# Patient Record
Sex: Female | Born: 1965 | Race: White | Hispanic: No | Marital: Married | Smoking: Former smoker
Health system: Southern US, Community
[De-identification: ages and names within clinical notes are randomized; demographics above are authoritative.]

## PROBLEM LIST (undated history)

## (undated) DIAGNOSIS — I10 Essential (primary) hypertension: Secondary | ICD-10-CM

## (undated) DIAGNOSIS — F605 Obsessive-compulsive personality disorder: Secondary | ICD-10-CM

## (undated) DIAGNOSIS — Z9229 Personal history of other drug therapy: Secondary | ICD-10-CM

## (undated) DIAGNOSIS — M542 Cervicalgia: Secondary | ICD-10-CM

## (undated) DIAGNOSIS — D649 Anemia, unspecified: Secondary | ICD-10-CM

## (undated) DIAGNOSIS — Z8701 Personal history of pneumonia (recurrent): Secondary | ICD-10-CM

## (undated) DIAGNOSIS — D839 Common variable immunodeficiency, unspecified: Secondary | ICD-10-CM

## (undated) DIAGNOSIS — K219 Gastro-esophageal reflux disease without esophagitis: Secondary | ICD-10-CM

## (undated) DIAGNOSIS — R413 Other amnesia: Secondary | ICD-10-CM

## (undated) DIAGNOSIS — F4310 Post-traumatic stress disorder, unspecified: Secondary | ICD-10-CM

## (undated) DIAGNOSIS — E785 Hyperlipidemia, unspecified: Secondary | ICD-10-CM

## (undated) DIAGNOSIS — F32A Depression, unspecified: Secondary | ICD-10-CM

## (undated) DIAGNOSIS — R3915 Urgency of urination: Secondary | ICD-10-CM

## (undated) DIAGNOSIS — E119 Type 2 diabetes mellitus without complications: Secondary | ICD-10-CM

## (undated) DIAGNOSIS — M797 Fibromyalgia: Secondary | ICD-10-CM

## (undated) DIAGNOSIS — M1990 Unspecified osteoarthritis, unspecified site: Secondary | ICD-10-CM

## (undated) DIAGNOSIS — F319 Bipolar disorder, unspecified: Secondary | ICD-10-CM

## (undated) DIAGNOSIS — K589 Irritable bowel syndrome without diarrhea: Secondary | ICD-10-CM

## (undated) DIAGNOSIS — Z8669 Personal history of other diseases of the nervous system and sense organs: Secondary | ICD-10-CM

## (undated) DIAGNOSIS — Z87898 Personal history of other specified conditions: Secondary | ICD-10-CM

## (undated) DIAGNOSIS — M25552 Pain in left hip: Secondary | ICD-10-CM

## (undated) DIAGNOSIS — F329 Major depressive disorder, single episode, unspecified: Secondary | ICD-10-CM

## (undated) HISTORY — DX: Hyperlipidemia, unspecified: E78.5

## (undated) HISTORY — DX: Post-traumatic stress disorder, unspecified: F43.10

## (undated) HISTORY — DX: Pain in left hip: M25.552

## (undated) HISTORY — DX: Depression, unspecified: F32.A

## (undated) HISTORY — DX: Personal history of pneumonia (recurrent): Z87.01

## (undated) HISTORY — DX: Gastro-esophageal reflux disease without esophagitis: K21.9

## (undated) HISTORY — DX: Personal history of other diseases of the nervous system and sense organs: Z86.69

## (undated) HISTORY — DX: Obsessive-compulsive personality disorder: F60.5

## (undated) HISTORY — DX: Cervicalgia: M54.2

## (undated) HISTORY — PX: CHOLECYSTECTOMY: SHX55

## (undated) HISTORY — DX: Fibromyalgia: M79.7

## (undated) HISTORY — DX: Major depressive disorder, single episode, unspecified: F32.9

## (undated) HISTORY — DX: Bipolar disorder, unspecified: F31.9

## (undated) HISTORY — DX: Essential (primary) hypertension: I10

## (undated) HISTORY — DX: Irritable bowel syndrome, unspecified: K58.9

## (undated) HISTORY — DX: Type 2 diabetes mellitus without complications: E11.9

## (undated) HISTORY — DX: Common variable immunodeficiency, unspecified: D83.9

## (undated) HISTORY — PX: TUBAL LIGATION: SHX77

## (undated) HISTORY — DX: Personal history of other specified conditions: Z87.898

## (undated) HISTORY — PX: OTHER SURGICAL HISTORY: SHX169

## (undated) HISTORY — DX: Personal history of other drug therapy: Z92.29

---
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
## 2002-07-15 ENCOUNTER — Ambulatory Visit (HOSPITAL_COMMUNITY): Admission: RE | Admit: 2002-07-15 | Discharge: 2002-07-15 | Payer: Self-pay | Admitting: Obstetrics and Gynecology

## 2002-07-15 ENCOUNTER — Encounter: Payer: Self-pay | Admitting: Obstetrics and Gynecology

## 2002-07-19 ENCOUNTER — Encounter: Payer: Self-pay | Admitting: Obstetrics and Gynecology

## 2002-07-19 ENCOUNTER — Ambulatory Visit (HOSPITAL_COMMUNITY): Admission: RE | Admit: 2002-07-19 | Discharge: 2002-07-19 | Payer: Self-pay | Admitting: Obstetrics and Gynecology

## 2004-01-28 ENCOUNTER — Ambulatory Visit (HOSPITAL_COMMUNITY): Admission: RE | Admit: 2004-01-28 | Discharge: 2004-01-28 | Payer: Self-pay | Admitting: Pediatrics

## 2006-12-05 HISTORY — PX: MUSCLE BIOPSY: SHX716

## 2007-10-02 ENCOUNTER — Encounter (INDEPENDENT_AMBULATORY_CARE_PROVIDER_SITE_OTHER): Payer: Self-pay | Admitting: Family Medicine

## 2007-10-03 ENCOUNTER — Ambulatory Visit: Payer: Self-pay | Admitting: Family Medicine

## 2007-10-03 DIAGNOSIS — Z87891 Personal history of nicotine dependence: Secondary | ICD-10-CM | POA: Insufficient documentation

## 2007-10-03 DIAGNOSIS — K219 Gastro-esophageal reflux disease without esophagitis: Secondary | ICD-10-CM

## 2007-10-03 DIAGNOSIS — J45909 Unspecified asthma, uncomplicated: Secondary | ICD-10-CM | POA: Insufficient documentation

## 2007-10-03 DIAGNOSIS — K589 Irritable bowel syndrome without diarrhea: Secondary | ICD-10-CM

## 2007-10-03 DIAGNOSIS — E782 Mixed hyperlipidemia: Secondary | ICD-10-CM

## 2007-10-03 DIAGNOSIS — M797 Fibromyalgia: Secondary | ICD-10-CM | POA: Insufficient documentation

## 2007-10-03 DIAGNOSIS — F329 Major depressive disorder, single episode, unspecified: Secondary | ICD-10-CM

## 2007-10-03 DIAGNOSIS — IMO0001 Reserved for inherently not codable concepts without codable children: Secondary | ICD-10-CM

## 2007-10-03 DIAGNOSIS — R002 Palpitations: Secondary | ICD-10-CM

## 2007-10-04 ENCOUNTER — Encounter (INDEPENDENT_AMBULATORY_CARE_PROVIDER_SITE_OTHER): Payer: Self-pay | Admitting: Family Medicine

## 2007-10-04 ENCOUNTER — Telehealth (INDEPENDENT_AMBULATORY_CARE_PROVIDER_SITE_OTHER): Payer: Self-pay | Admitting: *Deleted

## 2007-10-10 ENCOUNTER — Ambulatory Visit (HOSPITAL_COMMUNITY): Admission: RE | Admit: 2007-10-10 | Discharge: 2007-10-10 | Payer: Self-pay | Admitting: Family Medicine

## 2007-10-15 ENCOUNTER — Ambulatory Visit: Payer: Self-pay | Admitting: Cardiology

## 2007-10-18 ENCOUNTER — Ambulatory Visit: Payer: Self-pay | Admitting: Family Medicine

## 2007-10-25 ENCOUNTER — Encounter (INDEPENDENT_AMBULATORY_CARE_PROVIDER_SITE_OTHER): Payer: Self-pay | Admitting: Family Medicine

## 2007-10-26 ENCOUNTER — Telehealth (INDEPENDENT_AMBULATORY_CARE_PROVIDER_SITE_OTHER): Payer: Self-pay | Admitting: Family Medicine

## 2007-10-30 ENCOUNTER — Encounter (INDEPENDENT_AMBULATORY_CARE_PROVIDER_SITE_OTHER): Payer: Self-pay | Admitting: Family Medicine

## 2007-11-05 ENCOUNTER — Telehealth (INDEPENDENT_AMBULATORY_CARE_PROVIDER_SITE_OTHER): Payer: Self-pay | Admitting: *Deleted

## 2007-11-16 ENCOUNTER — Ambulatory Visit: Payer: Self-pay | Admitting: Family Medicine

## 2007-11-16 DIAGNOSIS — I10 Essential (primary) hypertension: Secondary | ICD-10-CM | POA: Insufficient documentation

## 2007-11-19 ENCOUNTER — Telehealth (INDEPENDENT_AMBULATORY_CARE_PROVIDER_SITE_OTHER): Payer: Self-pay | Admitting: *Deleted

## 2007-11-19 ENCOUNTER — Encounter (INDEPENDENT_AMBULATORY_CARE_PROVIDER_SITE_OTHER): Payer: Self-pay | Admitting: Family Medicine

## 2007-11-19 DIAGNOSIS — F319 Bipolar disorder, unspecified: Secondary | ICD-10-CM | POA: Insufficient documentation

## 2007-11-21 ENCOUNTER — Ambulatory Visit (HOSPITAL_COMMUNITY): Admission: RE | Admit: 2007-11-21 | Discharge: 2007-11-21 | Payer: Self-pay | Admitting: Family Medicine

## 2007-11-22 ENCOUNTER — Telehealth (INDEPENDENT_AMBULATORY_CARE_PROVIDER_SITE_OTHER): Payer: Self-pay | Admitting: Family Medicine

## 2007-11-26 ENCOUNTER — Telehealth (INDEPENDENT_AMBULATORY_CARE_PROVIDER_SITE_OTHER): Payer: Self-pay | Admitting: Family Medicine

## 2007-12-11 ENCOUNTER — Telehealth (INDEPENDENT_AMBULATORY_CARE_PROVIDER_SITE_OTHER): Payer: Self-pay | Admitting: *Deleted

## 2007-12-24 ENCOUNTER — Encounter (INDEPENDENT_AMBULATORY_CARE_PROVIDER_SITE_OTHER): Payer: Self-pay | Admitting: Family Medicine

## 2007-12-27 ENCOUNTER — Ambulatory Visit (HOSPITAL_COMMUNITY): Payer: Self-pay | Admitting: Psychiatry

## 2008-01-08 ENCOUNTER — Telehealth (INDEPENDENT_AMBULATORY_CARE_PROVIDER_SITE_OTHER): Payer: Self-pay | Admitting: Family Medicine

## 2008-01-10 ENCOUNTER — Ambulatory Visit (HOSPITAL_COMMUNITY): Payer: Self-pay | Admitting: Psychiatry

## 2008-01-11 ENCOUNTER — Ambulatory Visit (HOSPITAL_COMMUNITY): Payer: Self-pay | Admitting: Psychology

## 2008-01-21 ENCOUNTER — Ambulatory Visit: Payer: Self-pay | Admitting: Family Medicine

## 2008-01-22 ENCOUNTER — Telehealth (INDEPENDENT_AMBULATORY_CARE_PROVIDER_SITE_OTHER): Payer: Self-pay | Admitting: Family Medicine

## 2008-01-23 ENCOUNTER — Encounter (INDEPENDENT_AMBULATORY_CARE_PROVIDER_SITE_OTHER): Payer: Self-pay | Admitting: Family Medicine

## 2008-01-23 ENCOUNTER — Telehealth (INDEPENDENT_AMBULATORY_CARE_PROVIDER_SITE_OTHER): Payer: Self-pay | Admitting: *Deleted

## 2008-01-24 ENCOUNTER — Ambulatory Visit (HOSPITAL_COMMUNITY): Payer: Self-pay | Admitting: Psychiatry

## 2008-02-07 ENCOUNTER — Ambulatory Visit: Payer: Self-pay | Admitting: Family Medicine

## 2008-02-07 LAB — CONVERTED CEMR LAB
ALT: 20 units/L (ref 0–35)
BUN: 9 mg/dL (ref 6–23)
Basophils Absolute: 0.1 10*3/uL (ref 0.0–0.1)
CO2: 24 meq/L (ref 19–32)
Calcium: 10 mg/dL (ref 8.4–10.5)
Chloride: 105 meq/L (ref 96–112)
Cholesterol: 201 mg/dL — ABNORMAL HIGH (ref 0–200)
Creatinine, Ser: 0.9 mg/dL (ref 0.40–1.20)
Hemoglobin: 14.7 g/dL (ref 12.0–15.0)
Lymphocytes Relative: 33 % (ref 12–46)
Monocytes Absolute: 0.5 10*3/uL (ref 0.1–1.0)
Monocytes Relative: 7 % (ref 3–12)
Neutro Abs: 3.9 10*3/uL (ref 1.7–7.7)
RBC: 4.53 M/uL (ref 3.87–5.11)
Total CHOL/HDL Ratio: 4.7

## 2008-02-08 ENCOUNTER — Telehealth (INDEPENDENT_AMBULATORY_CARE_PROVIDER_SITE_OTHER): Payer: Self-pay | Admitting: Family Medicine

## 2008-02-12 ENCOUNTER — Ambulatory Visit (HOSPITAL_COMMUNITY): Payer: Self-pay | Admitting: Psychiatry

## 2008-02-14 ENCOUNTER — Encounter (INDEPENDENT_AMBULATORY_CARE_PROVIDER_SITE_OTHER): Payer: Self-pay | Admitting: Family Medicine

## 2008-02-22 ENCOUNTER — Ambulatory Visit: Payer: Self-pay | Admitting: Family Medicine

## 2008-02-25 ENCOUNTER — Telehealth (INDEPENDENT_AMBULATORY_CARE_PROVIDER_SITE_OTHER): Payer: Self-pay | Admitting: *Deleted

## 2008-02-25 ENCOUNTER — Ambulatory Visit: Payer: Self-pay | Admitting: Family Medicine

## 2008-02-25 ENCOUNTER — Ambulatory Visit (HOSPITAL_COMMUNITY): Payer: Self-pay | Admitting: Psychology

## 2008-02-25 DIAGNOSIS — J309 Allergic rhinitis, unspecified: Secondary | ICD-10-CM | POA: Insufficient documentation

## 2008-03-06 ENCOUNTER — Ambulatory Visit (HOSPITAL_COMMUNITY): Payer: Self-pay | Admitting: Psychiatry

## 2008-03-20 ENCOUNTER — Ambulatory Visit: Payer: Self-pay | Admitting: Family Medicine

## 2008-03-21 ENCOUNTER — Telehealth (INDEPENDENT_AMBULATORY_CARE_PROVIDER_SITE_OTHER): Payer: Self-pay | Admitting: Family Medicine

## 2008-04-07 ENCOUNTER — Telehealth (INDEPENDENT_AMBULATORY_CARE_PROVIDER_SITE_OTHER): Payer: Self-pay | Admitting: *Deleted

## 2008-04-14 ENCOUNTER — Encounter (INDEPENDENT_AMBULATORY_CARE_PROVIDER_SITE_OTHER): Payer: Self-pay | Admitting: Family Medicine

## 2008-04-14 ENCOUNTER — Telehealth (INDEPENDENT_AMBULATORY_CARE_PROVIDER_SITE_OTHER): Payer: Self-pay | Admitting: Family Medicine

## 2008-04-17 ENCOUNTER — Telehealth (INDEPENDENT_AMBULATORY_CARE_PROVIDER_SITE_OTHER): Payer: Self-pay | Admitting: *Deleted

## 2008-04-17 ENCOUNTER — Ambulatory Visit: Payer: Self-pay | Admitting: Family Medicine

## 2008-04-17 DIAGNOSIS — R519 Headache, unspecified: Secondary | ICD-10-CM | POA: Insufficient documentation

## 2008-04-17 DIAGNOSIS — R51 Headache: Secondary | ICD-10-CM

## 2008-04-18 ENCOUNTER — Telehealth (INDEPENDENT_AMBULATORY_CARE_PROVIDER_SITE_OTHER): Payer: Self-pay | Admitting: *Deleted

## 2008-04-18 ENCOUNTER — Encounter (INDEPENDENT_AMBULATORY_CARE_PROVIDER_SITE_OTHER): Payer: Self-pay | Admitting: Family Medicine

## 2008-04-22 ENCOUNTER — Encounter (INDEPENDENT_AMBULATORY_CARE_PROVIDER_SITE_OTHER): Payer: Self-pay | Admitting: Family Medicine

## 2008-04-22 LAB — CONVERTED CEMR LAB: Creatinine, Ser: 0.91 mg/dL (ref 0.40–1.20)

## 2008-04-23 ENCOUNTER — Ambulatory Visit (HOSPITAL_COMMUNITY): Admission: RE | Admit: 2008-04-23 | Discharge: 2008-04-23 | Payer: Self-pay | Admitting: Family Medicine

## 2008-04-24 ENCOUNTER — Telehealth (INDEPENDENT_AMBULATORY_CARE_PROVIDER_SITE_OTHER): Payer: Self-pay | Admitting: *Deleted

## 2008-04-29 ENCOUNTER — Telehealth (INDEPENDENT_AMBULATORY_CARE_PROVIDER_SITE_OTHER): Payer: Self-pay | Admitting: Family Medicine

## 2008-05-01 ENCOUNTER — Ambulatory Visit: Payer: Self-pay | Admitting: Internal Medicine

## 2008-05-01 ENCOUNTER — Ambulatory Visit (HOSPITAL_COMMUNITY): Payer: Self-pay | Admitting: Psychiatry

## 2008-05-02 ENCOUNTER — Ambulatory Visit: Payer: Self-pay | Admitting: Family Medicine

## 2008-05-07 ENCOUNTER — Telehealth (INDEPENDENT_AMBULATORY_CARE_PROVIDER_SITE_OTHER): Payer: Self-pay | Admitting: *Deleted

## 2008-05-07 ENCOUNTER — Emergency Department (HOSPITAL_COMMUNITY): Admission: EM | Admit: 2008-05-07 | Discharge: 2008-05-07 | Payer: Self-pay | Admitting: Emergency Medicine

## 2008-05-08 ENCOUNTER — Ambulatory Visit (HOSPITAL_COMMUNITY): Payer: Self-pay | Admitting: Psychiatry

## 2008-05-13 ENCOUNTER — Ambulatory Visit (HOSPITAL_COMMUNITY): Payer: Self-pay | Admitting: Psychiatry

## 2008-05-16 ENCOUNTER — Ambulatory Visit: Payer: Self-pay | Admitting: Family Medicine

## 2008-06-03 ENCOUNTER — Ambulatory Visit: Payer: Self-pay | Admitting: Family Medicine

## 2008-06-03 LAB — CONVERTED CEMR LAB
Blood in Urine, dipstick: NEGATIVE
Ketones, urine, test strip: NEGATIVE
Urobilinogen, UA: 0.2
WBC Urine, dipstick: NEGATIVE

## 2008-06-10 ENCOUNTER — Ambulatory Visit (HOSPITAL_COMMUNITY): Payer: Self-pay | Admitting: Psychiatry

## 2008-06-12 ENCOUNTER — Ambulatory Visit (HOSPITAL_COMMUNITY): Payer: Self-pay | Admitting: Psychiatry

## 2008-06-16 ENCOUNTER — Emergency Department (HOSPITAL_COMMUNITY): Admission: EM | Admit: 2008-06-16 | Discharge: 2008-06-16 | Payer: Self-pay | Admitting: Emergency Medicine

## 2008-06-25 ENCOUNTER — Encounter (INDEPENDENT_AMBULATORY_CARE_PROVIDER_SITE_OTHER): Payer: Self-pay | Admitting: Family Medicine

## 2008-07-03 ENCOUNTER — Ambulatory Visit (HOSPITAL_COMMUNITY): Admission: RE | Admit: 2008-07-03 | Discharge: 2008-07-03 | Payer: Self-pay | Admitting: Family Medicine

## 2008-07-15 ENCOUNTER — Ambulatory Visit (HOSPITAL_COMMUNITY): Payer: Self-pay | Admitting: Psychiatry

## 2008-08-05 ENCOUNTER — Ambulatory Visit (HOSPITAL_COMMUNITY): Payer: Self-pay | Admitting: Psychiatry

## 2008-08-12 ENCOUNTER — Ambulatory Visit (HOSPITAL_COMMUNITY): Payer: Self-pay | Admitting: Psychiatry

## 2008-08-12 ENCOUNTER — Ambulatory Visit: Admission: RE | Admit: 2008-08-12 | Discharge: 2008-08-12 | Payer: Self-pay | Admitting: Family Medicine

## 2008-08-13 ENCOUNTER — Ambulatory Visit (HOSPITAL_COMMUNITY): Admission: RE | Admit: 2008-08-13 | Discharge: 2008-08-13 | Payer: Self-pay | Admitting: Family Medicine

## 2008-09-16 ENCOUNTER — Ambulatory Visit (HOSPITAL_COMMUNITY): Payer: Self-pay | Admitting: Psychiatry

## 2008-10-16 ENCOUNTER — Ambulatory Visit (HOSPITAL_COMMUNITY): Payer: Self-pay | Admitting: Psychiatry

## 2008-12-11 ENCOUNTER — Ambulatory Visit (HOSPITAL_COMMUNITY): Payer: Self-pay | Admitting: Psychiatry

## 2009-02-05 ENCOUNTER — Ambulatory Visit (HOSPITAL_COMMUNITY): Payer: Self-pay | Admitting: Psychiatry

## 2009-02-13 ENCOUNTER — Ambulatory Visit: Payer: Self-pay | Admitting: Family Medicine

## 2009-02-13 DIAGNOSIS — L708 Other acne: Secondary | ICD-10-CM

## 2009-02-13 DIAGNOSIS — E559 Vitamin D deficiency, unspecified: Secondary | ICD-10-CM | POA: Insufficient documentation

## 2009-02-17 ENCOUNTER — Encounter (INDEPENDENT_AMBULATORY_CARE_PROVIDER_SITE_OTHER): Payer: Self-pay | Admitting: Family Medicine

## 2009-03-05 ENCOUNTER — Ambulatory Visit: Payer: Self-pay | Admitting: Family Medicine

## 2009-03-05 ENCOUNTER — Ambulatory Visit (HOSPITAL_COMMUNITY): Payer: Self-pay | Admitting: Psychiatry

## 2009-03-05 DIAGNOSIS — D649 Anemia, unspecified: Secondary | ICD-10-CM | POA: Insufficient documentation

## 2009-03-05 DIAGNOSIS — R740 Nonspecific elevation of levels of transaminase and lactic acid dehydrogenase [LDH]: Secondary | ICD-10-CM

## 2009-03-06 ENCOUNTER — Encounter (INDEPENDENT_AMBULATORY_CARE_PROVIDER_SITE_OTHER): Payer: Self-pay | Admitting: Family Medicine

## 2009-03-09 LAB — CONVERTED CEMR LAB
ALT: 38 units/L — ABNORMAL HIGH (ref 0–35)
Albumin: 4.6 g/dL (ref 3.5–5.2)
CO2: 22 meq/L (ref 19–32)
Chloride: 105 meq/L (ref 96–112)
Cholesterol: 252 mg/dL — ABNORMAL HIGH (ref 0–200)
Glucose, Bld: 92 mg/dL (ref 70–99)
Hemoglobin: 12.1 g/dL (ref 12.0–15.0)
Lymphocytes Relative: 24 % (ref 12–46)
Lymphs Abs: 2.1 10*3/uL (ref 0.7–4.0)
MCHC: 32.2 g/dL (ref 30.0–36.0)
Monocytes Absolute: 0.6 10*3/uL (ref 0.1–1.0)
Monocytes Relative: 7 % (ref 3–12)
Neutro Abs: 5.9 10*3/uL (ref 1.7–7.7)
Potassium: 4.3 meq/L (ref 3.5–5.3)
RBC: 4.14 M/uL (ref 3.87–5.11)
Sodium: 141 meq/L (ref 135–145)
Total Protein: 6.6 g/dL (ref 6.0–8.3)
Triglycerides: 295 mg/dL — ABNORMAL HIGH (ref <150)
Vit D, 25-Hydroxy: 25 ng/mL — ABNORMAL LOW (ref 30–89)
WBC: 8.7 10*3/uL (ref 4.0–10.5)

## 2009-03-16 ENCOUNTER — Ambulatory Visit: Payer: Self-pay | Admitting: Family Medicine

## 2009-03-20 ENCOUNTER — Telehealth (INDEPENDENT_AMBULATORY_CARE_PROVIDER_SITE_OTHER): Payer: Self-pay | Admitting: *Deleted

## 2009-04-06 ENCOUNTER — Encounter (INDEPENDENT_AMBULATORY_CARE_PROVIDER_SITE_OTHER): Payer: Self-pay | Admitting: Family Medicine

## 2009-04-09 ENCOUNTER — Ambulatory Visit: Payer: Self-pay | Admitting: Family Medicine

## 2009-04-13 ENCOUNTER — Ambulatory Visit: Payer: Self-pay | Admitting: Cardiology

## 2009-04-13 ENCOUNTER — Encounter: Payer: Self-pay | Admitting: Cardiology

## 2009-04-17 ENCOUNTER — Ambulatory Visit: Payer: Self-pay | Admitting: Cardiology

## 2009-05-05 ENCOUNTER — Ambulatory Visit: Payer: Self-pay | Admitting: Family Medicine

## 2009-05-05 ENCOUNTER — Ambulatory Visit (HOSPITAL_COMMUNITY): Payer: Self-pay | Admitting: Psychiatry

## 2009-05-06 ENCOUNTER — Encounter (INDEPENDENT_AMBULATORY_CARE_PROVIDER_SITE_OTHER): Payer: Self-pay | Admitting: Family Medicine

## 2009-05-12 ENCOUNTER — Telehealth (INDEPENDENT_AMBULATORY_CARE_PROVIDER_SITE_OTHER): Payer: Self-pay | Admitting: *Deleted

## 2009-05-13 ENCOUNTER — Encounter (INDEPENDENT_AMBULATORY_CARE_PROVIDER_SITE_OTHER): Payer: Self-pay | Admitting: Family Medicine

## 2009-06-02 ENCOUNTER — Ambulatory Visit: Payer: Self-pay | Admitting: Family Medicine

## 2009-06-03 ENCOUNTER — Telehealth (INDEPENDENT_AMBULATORY_CARE_PROVIDER_SITE_OTHER): Payer: Self-pay | Admitting: Family Medicine

## 2009-06-04 ENCOUNTER — Telehealth (INDEPENDENT_AMBULATORY_CARE_PROVIDER_SITE_OTHER): Payer: Self-pay | Admitting: Family Medicine

## 2009-06-12 ENCOUNTER — Telehealth (INDEPENDENT_AMBULATORY_CARE_PROVIDER_SITE_OTHER): Payer: Self-pay | Admitting: *Deleted

## 2009-06-30 ENCOUNTER — Ambulatory Visit (HOSPITAL_COMMUNITY): Payer: Self-pay | Admitting: Psychiatry

## 2009-06-30 ENCOUNTER — Ambulatory Visit: Payer: Self-pay | Admitting: Family Medicine

## 2009-06-30 DIAGNOSIS — K5909 Other constipation: Secondary | ICD-10-CM

## 2009-06-30 DIAGNOSIS — K5900 Constipation, unspecified: Secondary | ICD-10-CM | POA: Insufficient documentation

## 2009-06-30 DIAGNOSIS — R194 Change in bowel habit: Secondary | ICD-10-CM | POA: Insufficient documentation

## 2009-07-02 ENCOUNTER — Ambulatory Visit (HOSPITAL_COMMUNITY): Payer: Self-pay | Admitting: Psychiatry

## 2009-07-09 ENCOUNTER — Ambulatory Visit (HOSPITAL_COMMUNITY): Payer: Self-pay | Admitting: Psychiatry

## 2009-07-23 ENCOUNTER — Encounter (INDEPENDENT_AMBULATORY_CARE_PROVIDER_SITE_OTHER): Payer: Self-pay | Admitting: Family Medicine

## 2009-07-28 ENCOUNTER — Ambulatory Visit: Payer: Self-pay | Admitting: Family Medicine

## 2009-07-30 LAB — CONVERTED CEMR LAB
ALT: 19 units/L (ref 0–35)
Albumin: 4.6 g/dL (ref 3.5–5.2)
Alkaline Phosphatase: 49 units/L (ref 39–117)
CO2: 26 meq/L (ref 19–32)
Glucose, Bld: 91 mg/dL (ref 70–99)
Potassium: 4.9 meq/L (ref 3.5–5.3)
Sodium: 140 meq/L (ref 135–145)
Total Protein: 6.6 g/dL (ref 6.0–8.3)

## 2009-08-04 ENCOUNTER — Ambulatory Visit (HOSPITAL_COMMUNITY): Payer: Self-pay | Admitting: Psychiatry

## 2009-08-18 ENCOUNTER — Encounter (INDEPENDENT_AMBULATORY_CARE_PROVIDER_SITE_OTHER): Payer: Self-pay | Admitting: Family Medicine

## 2009-08-24 ENCOUNTER — Telehealth (INDEPENDENT_AMBULATORY_CARE_PROVIDER_SITE_OTHER): Payer: Self-pay | Admitting: *Deleted

## 2009-09-15 ENCOUNTER — Ambulatory Visit (HOSPITAL_COMMUNITY): Payer: Self-pay | Admitting: Psychiatry

## 2009-11-12 ENCOUNTER — Ambulatory Visit (HOSPITAL_COMMUNITY): Payer: Self-pay | Admitting: Psychiatry

## 2010-01-12 ENCOUNTER — Ambulatory Visit (HOSPITAL_COMMUNITY): Payer: Self-pay | Admitting: Psychiatry

## 2010-03-09 ENCOUNTER — Ambulatory Visit (HOSPITAL_COMMUNITY): Payer: Self-pay | Admitting: Psychiatry

## 2010-04-22 ENCOUNTER — Ambulatory Visit (HOSPITAL_COMMUNITY): Payer: Self-pay | Admitting: Psychiatry

## 2010-05-11 ENCOUNTER — Ambulatory Visit (HOSPITAL_COMMUNITY): Payer: Self-pay | Admitting: Psychiatry

## 2010-07-13 ENCOUNTER — Ambulatory Visit (HOSPITAL_COMMUNITY): Payer: Self-pay | Admitting: Psychiatry

## 2010-08-24 ENCOUNTER — Ambulatory Visit (HOSPITAL_COMMUNITY): Payer: Self-pay | Admitting: Psychiatry

## 2010-09-21 ENCOUNTER — Ambulatory Visit (HOSPITAL_COMMUNITY): Payer: Self-pay | Admitting: Psychiatry

## 2010-12-16 ENCOUNTER — Ambulatory Visit (HOSPITAL_COMMUNITY)
Admission: RE | Admit: 2010-12-16 | Discharge: 2010-12-16 | Payer: Self-pay | Source: Home / Self Care | Attending: Psychiatry | Admitting: Psychiatry

## 2011-01-02 LAB — CONVERTED CEMR LAB
HDL goal, serum: 40 mg/dL
LDL Goal: 130 mg/dL

## 2011-02-10 ENCOUNTER — Encounter (INDEPENDENT_AMBULATORY_CARE_PROVIDER_SITE_OTHER): Payer: 59 | Admitting: Psychiatry

## 2011-02-10 DIAGNOSIS — F3189 Other bipolar disorder: Secondary | ICD-10-CM

## 2011-04-07 ENCOUNTER — Encounter (HOSPITAL_COMMUNITY): Payer: 59 | Admitting: Psychiatry

## 2011-04-12 ENCOUNTER — Encounter (INDEPENDENT_AMBULATORY_CARE_PROVIDER_SITE_OTHER): Payer: 59 | Admitting: Psychiatry

## 2011-04-12 DIAGNOSIS — F319 Bipolar disorder, unspecified: Secondary | ICD-10-CM

## 2011-04-19 NOTE — Assessment & Plan Note (Signed)
[facility]                          [facility] OFFICE NOTE   NAME:Michelle Clements, [name]                         MRN:          [id number]  DATE:04/17/2009                            DOB:          06-22-1966    PRIMARY CARE PHYSICIAN:  Dr. Erby Pian, [place] Ace.   REASON FOR PRESENTATION:  Evaluate the patient's palpitations.   HISTORY OF PRESENT ILLNESS:  The patient is a very pleasant 45 year old  white female with a history of palpitations a couple of years ago.  She  wore a Holter monitor which demonstrated no significant tachy  palpitations.  She was hospitalized on Apr 06, 2009, with palpitations.  She has been having an increased episode of these over several months,  but they were particularly severe that day.  She describes some  fluttering starting from her abdomen and working up.  She said it came  in waves.  She would feel dizzy.  She felt fatigued.  She would have  to take deep breaths which was happening fairly frequently, sometimes a  few times in an hour that day.  She went to the emergency room.  She was  hospitalized overnight.  There was nothing identified on telemetry.  She  had normal electrolytes.  Her thyroid was normal.  She did have an  outpatient echocardiogram which was essentially normal with perhaps mild  concentric LVH.  No medication changes were suggested.  She was set up  to come see [place] here.   Since that time, she has had very few palpitations.  She has had perhaps  a couple of isolated episodes.  She has had no chest pressure, neck or  arm discomfort.  She has had no shortness of breath, PND, or orthopnea.  She works out at [employer] about 3 times a week.  She does smoke a half  pack of cigarettes a day.   PAST MEDICAL HISTORY:  1. Fibromyalgia.  2. Bipolar affective disorder.  3. Asthma.  4. Seasonal allergies.  5. Gastroesophageal reflux disease.  6. Irritable bowel syndrome.  7. Sleep apnea, on CPAP.  8.  Hyperlipidemia.   PAST SURGICAL HISTORY:  Bilateral tubal ligation and cholecystectomy.   ALLERGIES:  SULFA.   MEDICATIONS:  1. Clonazepam 1 mg b.i.d.  2. Omeprazole 20 mg daily.  3. Lamotrigine 200 mg daily.  4. Equate.  5. Doxycycline 50 mg b.i.d.  6. Vitamin D.  7. Geodon 80 mg nightly.  8. Citalopram 80 mg daily.  9. Singulair 10 mg daily.  10.Amitiza 24 mcg b.i.d.   SOCIAL HISTORY:  The patient smokes cigarettes.  She smokes a half pack  per day.  She does not drink alcohol.  She has 2 children, 1 aged 68, 1  aged 84.  She is on disability from her fibromyalgia and bipolar  disorder.   FAMILY HISTORY:  Contributory for her mother having heart disease in her  79s.  She is alive in her 24s with heart failure.  Her father died of  brain cancer at age 59.   REVIEW OF SYSTEMS:  As stated in the HPI  and positive for constipation,  some mild ankle and hand swelling sporadically, mild joint pains.  Negative for all other systems.   PHYSICAL EXAMINATION:  GENERAL:  The patient is pleasant and in no  distress.  VITAL SIGNS:  Blood pressure 123/75, heart rate 74 and regular, and  weight 248 pounds.  HEENT:  Eyelids unremarkable.  Pupils equal, round, and reactive to  light.  Fundi not visualized.  Oral mucosa unremarkable.  NECK:  No jugular venous distention at 45 degrees.  Carotid upstroke  brisk and symmetric.  No bruits.  No thyromegaly.  LYMPHATICS:  No cervical, axillary, or inguinal adenopathy.  LUNGS:  Clear to auscultation bilaterally.  BACK:  No costovertebral angle tenderness.  CHEST:  Unremarkable.  HEART:  PMI not displaced or sustained.  S1 and S2 within normal limits.  No S3, no S4.  No clicks, no rubs, no murmurs.  ABDOMEN:  Obese, positive bowel sounds, normal in frequency and pitch.  No bruits, no rebound, no guarding.  No midline pulsatile mass.  No  hepatomegaly, no splenomegaly.  SKIN:  No rashes, no nodules.  EXTREMITIES:  2+ pulses throughout.  No  edema, no cyanosis, no clubbing.  NEUROLOGIC:  Oriented to person, place, and time.  Cranial nerves II  through XII grossly intact.  Motor grossly intact.   EKG, (done in the hospital) sinus rhythm, rate 83, axis within normal  limits, intervals with very slightly prolonged QT.   ASSESSMENT AND PLAN:  1. Palpitations.  The patient's palpitations are very infrequent now.      The plan was for a CardioNet.  However, at this point, she and I,      both agree that this is not indicated.  We discussed the etiology      of tachy palpitations and the possibilities.  We discussed workup      should these become more symptomatic.  We discussed the      contribution of caffeine.  She is going to let me know if she has      any increasing symptoms.  At that point, I would apply an event      monitor.  2. Tobacco.  We spent quite a bit of time discussing smoking      cessation.  She is going to try cold [place] though she has the      patches and the gum at home already.  3. Obesity.  We discussed weight loss.  I would have her concentrate      on smoking first.  4. Bipolar disorder.  She says this is well treated by her primary      care doctor.  5. Followup.  We will see her back in this clinic as needed.     Rollene Rotunda, MD, [facility]  Electronically Signed    JH/MedQ  DD: 04/17/2009  DT: 04/18/2009  Job #: [id number]   cc:   Franchot Heidelberg, M.D.

## 2011-04-19 NOTE — Procedures (Signed)
NAME:  Michelle Clements, Michelle Clements                  ACCOUNT NO.:  [id number]   MEDICAL RECORD NO.:  [id number]          PATIENT TYPE:  OUT   LOCATION:  SLEE                          FACILITY:  [facility]   PHYSICIAN:  Kofi A. Gerilyn Pilgrim, M.D. DATE OF BIRTH:  Apr 23, 1966   DATE OF PROCEDURE:  DATE OF DISCHARGE:  08/12/2008                             SLEEP DISORDER REPORT   HISTORY:  This is a 45 year old who presents with hypersomnia, snoring,  and is to be evaluated for obstructive sleep apnea syndrome.   MEDICATIONS:  Flexeril, Ultram, Lyrica, Prilosec, Klonopin, Singulair,  Lamictal, Celexa, Geodon.   Epworth sleepiness scale 17.  BMI 33.   ARCHITECTURAL SUMMARY:  This is a split-night study with the first  portion being a diagnostic and second half a titration.  The total  recording time in the diagnostic is 145 minutes and titration 259  minutes.  The sleep efficiency in diagnostic is 75% and 94% is titration  portion.  The sleep latency is 10 minutes and the REM latency is 93  minutes.   RESPIRATORY SUMMARY:  The baseline oxygen saturation 97% with lowest  saturation 93%.  The AHI in the diagnostic portion is 15.  The patient  is titrated between levels of 6 and 15.  She was titrated to higher  pressures due to activity seen in the snoring channel, she actually had  elimination of apneic events pressure of 6.  I believe the optimal  pressure is 10.  The event seen in the snore chart seems to be  artifactual.   LIMB MOVEMENT SUMMARY:  PLM index 0.8.   ELECTROCARDIOGRAM SUMMARY:  Average heart rate 84 with no significant  dysrhythmias observed.   IMPRESSION:  Moderate obstructive sleep apnea syndrome, which responded  well to CPAP of 10.   Thanks for this referral.      Kofi A. Gerilyn Pilgrim, M.D.  Electronically Signed     KAD/MEDQ  D:  08/16/2008  T:  08/16/2008  Job:  [id number]

## 2011-04-26 ENCOUNTER — Encounter (HOSPITAL_COMMUNITY): Payer: 59 | Admitting: Psychiatry

## 2011-06-02 ENCOUNTER — Encounter: Payer: Self-pay | Admitting: Family Medicine

## 2011-06-07 ENCOUNTER — Encounter (INDEPENDENT_AMBULATORY_CARE_PROVIDER_SITE_OTHER): Payer: 59 | Admitting: Psychiatry

## 2011-06-07 DIAGNOSIS — F3189 Other bipolar disorder: Secondary | ICD-10-CM

## 2011-07-05 ENCOUNTER — Encounter (HOSPITAL_COMMUNITY): Payer: 59 | Admitting: Psychiatry

## 2011-07-19 ENCOUNTER — Encounter (INDEPENDENT_AMBULATORY_CARE_PROVIDER_SITE_OTHER): Payer: 59 | Admitting: Psychiatry

## 2011-07-19 ENCOUNTER — Encounter (HOSPITAL_COMMUNITY): Payer: 59 | Admitting: Psychiatry

## 2011-07-19 DIAGNOSIS — F3189 Other bipolar disorder: Secondary | ICD-10-CM

## 2011-09-01 LAB — URINALYSIS, ROUTINE W REFLEX MICROSCOPIC
Bilirubin Urine: NEGATIVE
Ketones, ur: NEGATIVE
Nitrite: NEGATIVE
Protein, ur: NEGATIVE
Urobilinogen, UA: 0.2
pH: 6

## 2011-09-01 LAB — DIFFERENTIAL
Basophils Absolute: 0.1
Basophils Relative: 1
Eosinophils Absolute: 0.2
Eosinophils Relative: 1
Lymphs Abs: 2.4
Neutrophils Relative %: 74

## 2011-09-01 LAB — ETHANOL: Alcohol, Ethyl (B): <5

## 2011-09-01 LAB — BASIC METABOLIC PANEL
BUN: 8
Chloride: 102
Creatinine, Ser: 0.86
Glucose, Bld: 100 — ABNORMAL HIGH
Potassium: 4.2

## 2011-09-01 LAB — CBC
HCT: 43.2
MCHC: 36.4 — ABNORMAL HIGH
MCV: 93.5
Platelets: 380
RDW: 12.8
WBC: 13.1 — ABNORMAL HIGH

## 2011-09-01 LAB — RAPID URINE DRUG SCREEN, HOSP PERFORMED
Cocaine: NOT DETECTED
Tetrahydrocannabinol: NOT DETECTED

## 2011-09-01 LAB — PREGNANCY, URINE: Preg Test, Ur: NEGATIVE

## 2011-09-20 ENCOUNTER — Encounter (INDEPENDENT_AMBULATORY_CARE_PROVIDER_SITE_OTHER): Payer: 59 | Admitting: Psychiatry

## 2011-09-20 DIAGNOSIS — F3189 Other bipolar disorder: Secondary | ICD-10-CM

## 2011-09-29 ENCOUNTER — Encounter (INDEPENDENT_AMBULATORY_CARE_PROVIDER_SITE_OTHER): Payer: 59 | Admitting: Psychiatry

## 2011-09-29 DIAGNOSIS — F3189 Other bipolar disorder: Secondary | ICD-10-CM

## 2011-09-30 NOTE — Progress Notes (Signed)
NAME:  Michelle Clements, Michelle Clements                  ACCOUNT NO.:  [id number]  MEDICAL RECORD NO.:  [id number]  LOCATION:  [facility]                           FACILITY:  [facility]  PHYSICIAN:  Gomer France T. Destry Dauber, M.D.   DATE OF BIRTH:  21-Feb-1966                                PROGRESS NOTE   The patient came in today earlier than her scheduled appointment.  She was last seen on October 16.  At that time she does mention anxiety and nervousness and her Klonopin was increased however, the patient told that she had severe panic attack yesterday when she had difficulty breathing and she felt very depressed, tearful and anxious.  The patient was also given a short course of steroid due to exacerbation of her COPD.  The patient admitted that her mood and anger has been there for a long time and she has not discussed in detail on her last visit.  She admitted getting angry, agitated with her husband and lately with her daughter and she is afraid that her daughter is getting very tired as she is busy in school, tennis and then on her homework.  The patient also admitted increased paranoia with social isolation and decreased sleep though she denies any active or passive suicidal thoughts, but endorsed racing thoughts, easily irritable, agitated and angry.  She also admitted being mad at church, other people but did not become violent.  I review her notes.  The patient has been slowly stopping her psychotropic medication.  In the past she was on multiple psychotropic medications including Celexa, Geodon Klonopin, Wellbutrin, Lamictal but in the past few months she has been cutting down most of her medication due to the expense.  We have decreased and eventually discontinued her Lamictal on her last visit when she mentioned about the rash.  Though she still has some rash but she has been seeing more regress with stopping of Lamictal.  CURRENT MEDICATION: 1. Wellbutrin SR 150 mg daily. 2. Klonopin 1 mg twice a day. 3.  Flexeril 10 mg daily. 4. Glucophage 500 mg twice a day. 5. Synthroid 25 mcg daily. 6. Prevacid. 7. Singular. 8. Lortab 1-2 tablets daily for pain.  MEDICAL HISTORY: The patient sees Dr. Margo Aye.  She has diabetes, fibromyalgia, GERD and degenerative disease.  ALLERGIES: Patient is allergic to sulfa.  MENTAL STATUS EXAMINATION: The patient is very anxious, tearful, maintained fair eye contact.  Her speech is slow.  Her thought process is also slow but logical, linear, and goal-directed.  She endorsed some paranoia, but she denies any active or passive suicidal thoughts, homicidal thoughts or any visual or auditory hallucinations.  She described her mood as depressed and anxious.  Her affect was constricted.  She is alert and oriented x3. Her insight and judgment impulse control were okay.  DIAGNOSIS: Axis I:  Bipolar disorder. Axis II deferred. Axis III:  See medical history. Axis IV:  Moderate Axis V:  55-60.  PLAN OF TREATMENT: I talked to the patient in length about restarting the mood stabilizer. In the past she had a good response with Geodon and Lamictal but these medicines were stopped either due to  side effects or expense.  We talked about starting the lithium in a small dose to target the mood lability.  We will defer antipsychotic medication at this time, due to the concern of increased blood sugar.  However, if the patient continued to show little improvement with the lithium we will consider adding or starting a low-dose Risperdal.  I have explained the risks and benefits of medication in detail.  The patient is scheduled to see her primary care doctor in December for complete blood work.  I will see her again in 2 weeks and will consider getting a lithium level at that time.  I recommended to call us if she has any question or concern.  We also talked about the safety plan.  If she starts feeling worsening or anytime having suicidal thoughts or homicidal thoughts  then she needs to call 911 or go to local ER immediately.  Time spent 30 minutes.  I will see her in 2 weeks.     Kyheem Bathgate T. Lolly Mustache, M.D.     STA/MEDQ  D:  09/29/2011  T:  09/29/2011  Job:  [id number]  Electronically Signed by Kathryne Sharper M.D. on 09/30/2011 11:35:01 AM

## 2011-10-12 ENCOUNTER — Encounter (HOSPITAL_COMMUNITY): Payer: Self-pay | Admitting: Psychiatry

## 2011-10-13 ENCOUNTER — Encounter (HOSPITAL_COMMUNITY): Payer: 59 | Admitting: Psychiatry

## 2011-10-13 ENCOUNTER — Encounter (HOSPITAL_COMMUNITY): Payer: Self-pay | Admitting: Psychiatry

## 2011-10-13 ENCOUNTER — Ambulatory Visit (INDEPENDENT_AMBULATORY_CARE_PROVIDER_SITE_OTHER): Payer: 59 | Admitting: Psychiatry

## 2011-10-13 VITALS — Wt 252.8 lb

## 2011-10-13 DIAGNOSIS — F319 Bipolar disorder, unspecified: Secondary | ICD-10-CM

## 2011-10-13 MED ORDER — BUPROPION HCL ER (SR) 150 MG PO TB12: 150.0000 mg | ORAL_TABLET | Freq: Two times a day (BID) | ORAL | Status: DC

## 2011-10-13 MED ORDER — LITHIUM CARBONATE 300 MG PO TABS: ORAL_TABLET | ORAL | Status: DC

## 2011-10-13 MED ORDER — CLONAZEPAM 1 MG PO TABS: 1.0000 mg | ORAL_TABLET | Freq: Two times a day (BID) | ORAL | Status: DC | PRN

## 2011-10-13 NOTE — Progress Notes (Signed)
Patient came today for her followup appointment. She is now taking lithium twice a day she's been tolerating the medication however initially she had some shakes which is resolving now. She continued to have depressive symptoms and at times she feels very nervous anxious. Still has a few panic attacks since we saw her. She's been taking Klonopin that has been helping her a lot but she is concerned about her depressive thoughts and poor sleep she denies any paranoid thinking or any agitation or anger. In the past she has taken multiple psychotropic medications including Geodon Celexa Wellbutrin Lamictal but he has been stopped due to poor response expense of side effects. She does not want to go back to antipsychotic medication but agreed to take more Wellbutrin and lithium. I reviewed all her medications she has diabetes mellitus and antipsychotic medication may cause increased blood sugar and we will deferred until it is necessary. Mental status emanation patient remains anxious and at times tearful but maintained good eye contact. Her speech is slowed but clear and coherent. Her thought process is logical linear and goal-directed. There are no paranoid thinking and she denies any active or passive suicidal thinking or homicidal thinking. She also denies any visual or auditory hallucination. She described her mood as to past and her affect was constricted. She's alert and oriented x3. Attention and concentration were I insight judgment and impulse control are okay. Assessment bipolar disorder rule out major depressive disorder with psychotic features. Plan I will increase her lithium to 300 mg in the morning and 600 milligram at bedtime I will also increase her Wellbutrin to 150 mg twice a day. I explained the risks and benefits of medication and recommended to call us if she has any questions or concerns about the medication. Time spent 25 minutes I will see her again for to 5 weeks.

## 2011-10-18 ENCOUNTER — Encounter (HOSPITAL_COMMUNITY): Payer: Self-pay | Admitting: Psychiatry

## 2011-10-18 NOTE — Telephone Encounter (Signed)
This encounter was created in error - please disregard.

## 2011-10-18 NOTE — Progress Notes (Signed)
Pt did not done labs

## 2011-10-20 ENCOUNTER — Telehealth (HOSPITAL_COMMUNITY): Payer: Self-pay | Admitting: *Deleted

## 2011-10-20 NOTE — Telephone Encounter (Signed)
Patient called return patient is experiencing the more urination with lithium however she is feeling much better in her mood and less depressed. I recommended to lower the lithium twice a day and she is scheduled to get blood work tomorrow I recommended to call back if symptoms persist otherwise I will see her in 3 weeks.

## 2011-10-20 NOTE — Telephone Encounter (Signed)
Phone call from patient.    She is urinating every hour and getting up three times in the night to pee.   She is on Lithium.

## 2011-11-01 ENCOUNTER — Encounter (HOSPITAL_COMMUNITY): Payer: 59 | Admitting: Psychiatry

## 2011-11-10 ENCOUNTER — Encounter (HOSPITAL_COMMUNITY): Payer: Self-pay | Admitting: Psychiatry

## 2011-11-10 ENCOUNTER — Ambulatory Visit (INDEPENDENT_AMBULATORY_CARE_PROVIDER_SITE_OTHER): Payer: 59 | Admitting: Psychiatry

## 2011-11-10 VITALS — Wt 250.0 lb

## 2011-11-10 DIAGNOSIS — F3189 Other bipolar disorder: Secondary | ICD-10-CM

## 2011-11-10 DIAGNOSIS — F319 Bipolar disorder, unspecified: Secondary | ICD-10-CM

## 2011-11-10 MED ORDER — RISPERIDONE 0.5 MG PO TABS: 0.5000 mg | ORAL_TABLET | Freq: Every day | ORAL | Status: DC

## 2011-11-10 NOTE — Progress Notes (Signed)
[facility] [id number] Progress Note  Michelle Clements [id number] 45 y.o.  11/10/2011 12:07 PM  Chief Complaint: I continued to feel depressed, I have no energy and I continued to feel paranoid and anxious  History of Present Illness: Patient came for her followup appointment. She had tried lithium 900 mg however she complained of frequent urination tired feeling and decided to cut down to 600 mg on her own. Though she reported no frequent urination but noticed increased anxiety nervousness feeling overwhelmed poor sleep anxiety and racing thoughts. Recently she has seen by her primary care physician who had stopped her thyroid medicine believing if tired medicine is causing more nervousness. She believes her heart rate has been much improved along with her anxiety however she continued to endorse periods of severe depression and social isolation. She is now more open about taking Risperdal. In the past we have tried Abilify that cause zombielike feeling, Lamictal with rash and Geodon which was expensive. She is working closely with her primary care physician who will continue to monitor her blood sugar very often. Patient continued to endorse somatic symptoms, she has taking multiple medication for her back pain COPD fibromyalgia degenerative disease and diabetes. I review her labs her lithium level is 0.7 however she has cut down her lithium to 600 mg a day.  Suicidal Ideation: No Plan Formed: No Patient has means to carry out plan: No  Homicidal Ideation: No Plan Formed: No Patient has means to carry out plan: No  Review of Systems: Psychiatric: Agitation: No Hallucination: No Depressed Mood: Yes Insomnia: Yes Hypersomnia: No Altered Concentration: No Feels Worthless: No Grandiose Ideas: No Belief In Special Powers: No New/Increased Substance Abuse: No Compulsions: No  Neurologic: Headache: Yes Seizure: No Paresthesias: No  Past Medical Family, Social History: Patient has  history of diabetes fibromyalgia GERD and degenerative disease. She see Dr. Margo Aye for her primary physical condition and checkup.  Outpatient Encounter Prescriptions as of 11/10/2011  Medication Sig Dispense Refill  . albuterol (ACCUNEB) 0.63 MG/3ML nebulizer solution Take 1 ampule by nebulization every 6 (six) hours as needed.        Marland Kitchen buPROPion (WELLBUTRIN SR) 150 MG 12 hr tablet Take 1 tablet (150 mg total) by mouth 2 (two) times daily.  60 tablet  1  . clonazePAM (KLONOPIN) 1 MG tablet Take 1 tablet (1 mg total) by mouth 2 (two) times daily as needed for anxiety.  60 tablet  0  . cyclobenzaprine (FLEXERIL) 10 MG tablet Take 10 mg by mouth every 6 (six) hours as needed.        . gabapentin (NEURONTIN) 600 MG tablet       . HYDROcodone-acetaminophen (LORTAB) 7.5-500 MG per tablet Take 1 tablet by mouth every 8 (eight) hours as needed.        Marland Kitchen ibuprofen (ADVIL,MOTRIN) 200 MG tablet Take 600 mg by mouth every 6 (six) hours as needed.        . lithium 300 MG tablet 1 in am and 2 at hs  90 tablet  0  . montelukast (SINGULAIR) 10 MG tablet Take 10 mg by mouth daily.        . NON FORMULARY CPAP MASK, FACIAL CUSHION AND TUBING.       Marland Kitchen omeprazole (PRILOSEC OTC) 20 MG tablet Take 20 mg by mouth daily.        Marland Kitchen omeprazole (PRILOSEC) 20 MG capsule       . tretinoin (RETIN-A) 0.1 % cream       .  albuterol (PROAIR HFA) 108 (90 BASE) MCG/ACT inhaler Inhale 2 puffs into the lungs every 6 (six) hours as needed.        . lubiprostone (AMITIZA) 24 MCG capsule Take 24 mcg by mouth 2 (two) times daily.        . metFORMIN (GLUCOPHAGE) 500 MG tablet       . risperiDONE (RISPERDAL) 0.5 MG tablet Take 1 tablet (0.5 mg total) by mouth at bedtime.  30 tablet  1  . DISCONTD: benztropine (COGENTIN) 1 MG tablet Take 1 mg by mouth daily.        Marland Kitchen DISCONTD: budesonide-formoterol (SYMBICORT) 160-4.5 MCG/ACT inhaler Inhale 2 puffs into the lungs 2 (two) times daily.        Marland Kitchen DISCONTD: citalopram (CELEXA) 40 MG tablet Take  40 mg by mouth 2 (two) times daily.        Marland Kitchen DISCONTD: doxycycline (VIBRAMYCIN) 50 MG capsule Take 50 mg by mouth 2 (two) times daily.        Marland Kitchen DISCONTD: lamoTRIgine (LAMICTAL) 100 MG tablet Take 100 mg by mouth at bedtime.        Marland Kitchen DISCONTD: traMADol (ULTRAM) 50 MG tablet Take 50 mg by mouth every 6 (six) hours as needed.        Marland Kitchen DISCONTD: ziprasidone (GEODON) 80 MG capsule Take 80 mg by mouth daily.          Past Psychiatric History/Hospitalization(s): Anxiety: Yes Bipolar Disorder: Yes Depression: Yes Mania: No Psychosis: Yes Schizophrenia: No Personality Disorder: No Hospitalization for psychiatric illness: No History of Electroconvulsive Shock Therapy: No Prior Suicide Attempts: No  Physical Exam: Constitutional:  Wt 250 lb (113.399 kg)  General Appearance: alert, oriented, no acute distress  Musculoskeletal: Strength & Muscle Tone: within normal limits Gait & Station: normal Patient leans: N/A  Psychiatric: Speech (describe rate, volume, coherence, spontaneity, and abnormalities if any): Soft clear but nonspontaneous  Thought Process (describe rate, content, abstract reasoning, and computation): Slow with poverty of thought content. At times obsess with her somatic complaints denies any auditory visual hallucination  Associations: Coherent and Relevant  Thoughts: normal  Mental Status: Orientation: oriented to person, place and time/date Mood & Affect: depressed affect and flat affect Attention Span & Concentration: Poor  Medical Decision Making (Choose Three): New problem, with additional work up planned, Review or order clinical lab tests (1), Established Problem, Worsening (2), Review of Medication Regimen & Side Effects (2) and Review of New Medication or Change in Dosage (2)  Assessment: Axis I: Bipolar disorder depressed type  Axis II: Deferred  Axis III: See medical history  Axis IV: Mild to moderate  Axis V: 55-65   Plan: We will decrease her  lithium to 300 mg twice a day as she cannot tolerate 900 mg a day. I will start low-dose Risperdal 0.5 mg at bedtime. I have explained risks and benefits including metabolic and extrapyramidal side effects of Risperdal. She will closely monitor her blood sugar and call us if she has any question or concern about the medication. She is taking Wellbutrin SR 150 twice a day and Klonopin twice a day without any side effects. I reinforced safety plan that in case she feels worsening of her symptoms or any time having suicidal thoughts and homicidal thoughts and she need to call 911 or go to local ER. I will see her again in 4-5 weeks  Waylynn Benefiel T., MD 11/10/2011

## 2011-12-08 ENCOUNTER — Other Ambulatory Visit (HOSPITAL_COMMUNITY): Payer: Self-pay | Admitting: Psychiatry

## 2011-12-08 DIAGNOSIS — F319 Bipolar disorder, unspecified: Secondary | ICD-10-CM

## 2011-12-22 ENCOUNTER — Encounter (HOSPITAL_COMMUNITY): Payer: Self-pay | Admitting: Psychiatry

## 2011-12-22 ENCOUNTER — Ambulatory Visit (INDEPENDENT_AMBULATORY_CARE_PROVIDER_SITE_OTHER): Payer: 59 | Admitting: Psychiatry

## 2011-12-22 VITALS — Wt 255.0 lb

## 2011-12-22 DIAGNOSIS — F319 Bipolar disorder, unspecified: Secondary | ICD-10-CM

## 2011-12-22 MED ORDER — LITHIUM CARBONATE 300 MG PO CAPS: 300.0000 mg | ORAL_CAPSULE | Freq: Two times a day (BID) | ORAL | Status: DC

## 2011-12-22 MED ORDER — CLONAZEPAM 1 MG PO TABS: 1.0000 mg | ORAL_TABLET | Freq: Two times a day (BID) | ORAL | Status: DC | PRN

## 2011-12-22 MED ORDER — BUPROPION HCL ER (SR) 150 MG PO TB12: 150.0000 mg | ORAL_TABLET | Freq: Two times a day (BID) | ORAL | Status: DC

## 2011-12-22 MED ORDER — RISPERIDONE 0.5 MG PO TABS: 0.5000 mg | ORAL_TABLET | Freq: Two times a day (BID) | ORAL | Status: DC

## 2011-12-22 NOTE — Progress Notes (Signed)
[facility] [id number] Progress Note  Michelle Clements [id number] 46 y.o.  12/22/2011 11:58 AM  Patient came for her followup appointment. She's been doing better since we started Risperdal. Her thoughts are more clear and organized. She is sleeping good. She continues to have chronic depression and feeling tired but overall she feels less depressed and less anxious. She has less complain of crying spells. She had a good Christmas. On her last visit we also reduce her lithium which was causing urinary incontinence. The patient do not seem much improvement and that linear but she still does not want to go back on a higher lithium. Overall patient is stable on her current medication. She denies any agitation anger or paranoid thinking.  Suicidal Ideation: No Plan Formed: No Patient has means to carry out plan: No  Homicidal Ideation: No Plan Formed: No Patient has means to carry out plan: No  Review of Systems: Psychiatric: Agitation: No Hallucination: No Depressed Mood: Yes Insomnia: Yes Hypersomnia: No Altered Concentration: No Feels Worthless: No Grandiose Ideas: No Belief In Special Powers: No New/Increased Substance Abuse: No Compulsions: No  Neurologic: Headache: Yes Seizure: No Paresthesias: No  Past Medical Family, Social History: Patient has history of diabetes fibromyalgia GERD and degenerative disease. She see Dr. Margo Aye for her primary physical condition and checkup.  Outpatient Encounter Prescriptions as of 12/22/2011  Medication Sig Dispense Refill  . buPROPion (WELLBUTRIN SR) 150 MG 12 hr tablet Take 1 tablet (150 mg total) by mouth 2 (two) times daily.  60 tablet  1  . gabapentin (NEURONTIN) 600 MG tablet       . HYDROcodone-acetaminophen (LORTAB) 7.5-500 MG per tablet Take 1 tablet by mouth every 8 (eight) hours as needed.        Marland Kitchen DISCONTD: buPROPion (WELLBUTRIN SR) 150 MG 12 hr tablet Take 1 tablet (150 mg total) by mouth 2 (two) times daily.  60 tablet  1    . DISCONTD: lithium carbonate 300 MG capsule TAKE 1 CAPSULE IN THE MORNING AND 2 CAPSULES AT BEDTIME.  90 capsule  1  . albuterol (ACCUNEB) 0.63 MG/3ML nebulizer solution Take 1 ampule by nebulization every 6 (six) hours as needed.        Marland Kitchen albuterol (PROAIR HFA) 108 (90 BASE) MCG/ACT inhaler Inhale 2 puffs into the lungs every 6 (six) hours as needed.        . clonazePAM (KLONOPIN) 1 MG tablet Take 1 tablet (1 mg total) by mouth 2 (two) times daily as needed for anxiety.  60 tablet  1  . cyclobenzaprine (FLEXERIL) 10 MG tablet Take 10 mg by mouth every 6 (six) hours as needed.        . doxycycline (VIBRA-TABS) 100 MG tablet       . ibuprofen (ADVIL,MOTRIN) 200 MG tablet Take 600 mg by mouth every 6 (six) hours as needed.        . lithium carbonate 300 MG capsule Take 1 capsule (300 mg total) by mouth 2 (two) times daily with a meal.  60 capsule  0  . lubiprostone (AMITIZA) 24 MCG capsule Take 24 mcg by mouth 2 (two) times daily.        . metFORMIN (GLUCOPHAGE) 500 MG tablet       . montelukast (SINGULAIR) 10 MG tablet Take 10 mg by mouth daily.        . NON FORMULARY CPAP MASK, FACIAL CUSHION AND TUBING.       Marland Kitchen omeprazole (PRILOSEC OTC) 20 MG  tablet Take 20 mg by mouth daily.        Marland Kitchen omeprazole (PRILOSEC) 20 MG capsule       . predniSONE (DELTASONE) 10 MG tablet       . risperiDONE (RISPERDAL) 0.5 MG tablet Take 1 tablet (0.5 mg total) by mouth 2 (two) times daily.  30 tablet  1  . tretinoin (RETIN-A) 0.1 % cream       . DISCONTD: clonazePAM (KLONOPIN) 1 MG tablet Take 1 tablet (1 mg total) by mouth 2 (two) times daily as needed for anxiety.  60 tablet  0  . DISCONTD: lithium 300 MG tablet 1 in am and 2 at hs  90 tablet  0  . DISCONTD: lithium carbonate 300 MG capsule         Past Psychiatric History/Hospitalization(s): Reviewed Anxiety: Yes Bipolar Disorder: Yes Depression: Yes Mania: No Psychosis: Yes Schizophrenia: No Personality Disorder: No Hospitalization for psychiatric  illness: No History of Electroconvulsive Shock Therapy: No Prior Suicide Attempts: No  Physical Exam: Constitutional:  Wt 255 lb (115.667 kg)  General Appearance: alert, oriented, no acute distress  Musculoskeletal: Strength & Muscle Tone: within normal limits Gait & Station: normal Patient leans: N/A  Mental status examination Patient is pleasant calm and cooperative. Her affect is improved from the past. She described her mood is anxious. She maintained good eye contact. Her speech is soft clear and coherent. She denies any paranoid thinking. Though she has poverty of thought content but there were no paranoia delusions or psychotic symptoms present. She denies any active or passive suicidal thinking and homicidal thinking. Her attention and concentration is fair. She's alert and oriented x3. Her insight judgment and impulse control is okay.   Assessment: Axis I: Bipolar disorder depressed type  Axis II: Deferred  Axis III: See medical history  Axis IV: Mild to moderate  Axis V: 55-65   Plan:  I discussed the patient in detail about her current symptoms. At this time I do believe patient needs to be on a continue medication. She is taking Risperdal 0.5 mg and she has history of diabetes but we had tried other medications which really did not help her. She will continue to be monitored by her primary care physician and checking her blood sugar regularly. I will continue lithium 300 mg twice a day. She still takes Klonopin twice a day for extreme anxiety. I have explained risks and benefits of medication including the metabolic side effects and we'll continue to monitor her weight.  I reinforced safety plan that in case she feels worsening of her symptoms or any time having suicidal thoughts and homicidal thoughts and she need to call 911 or go to local ER. I will see her again in 8  Weeks. Time spent 30 minutes  Tywana Robotham T., MD 12/22/2011

## 2012-02-21 ENCOUNTER — Encounter (HOSPITAL_COMMUNITY): Payer: Self-pay | Admitting: Psychiatry

## 2012-02-21 ENCOUNTER — Ambulatory Visit (INDEPENDENT_AMBULATORY_CARE_PROVIDER_SITE_OTHER): Payer: 59 | Admitting: Psychiatry

## 2012-02-21 VITALS — BP 145/65 | HR 90 | Wt 251.8 lb

## 2012-02-21 DIAGNOSIS — F319 Bipolar disorder, unspecified: Secondary | ICD-10-CM

## 2012-02-21 DIAGNOSIS — Z79899 Other long term (current) drug therapy: Secondary | ICD-10-CM

## 2012-02-21 LAB — CBC WITH DIFFERENTIAL/PLATELET
Basophils Relative: 1 % (ref 0–1)
Eosinophils Absolute: 0.5 10*3/uL (ref 0.0–0.7)
Eosinophils Relative: 6 % — ABNORMAL HIGH (ref 0–5)
HCT: 38.5 % (ref 36.0–46.0)
Hemoglobin: 13 g/dL (ref 12.0–15.0)
MCH: 31.2 pg (ref 26.0–34.0)
MCHC: 33.8 g/dL (ref 30.0–36.0)
MCV: 92.3 fL (ref 78.0–100.0)
Monocytes Absolute: 0.4 10*3/uL (ref 0.1–1.0)
Monocytes Relative: 5 % (ref 3–12)
Neutro Abs: 5.1 10*3/uL (ref 1.7–7.7)
RDW: 13.1 % (ref 11.5–15.5)

## 2012-02-21 LAB — COMPREHENSIVE METABOLIC PANEL
Albumin: 4.6 g/dL (ref 3.5–5.2)
Alkaline Phosphatase: 48 U/L (ref 39–117)
BUN: 9 mg/dL (ref 6–23)
Creat: 0.88 mg/dL (ref 0.50–1.10)
Glucose, Bld: 134 mg/dL — ABNORMAL HIGH (ref 70–99)
Potassium: 4.3 mEq/L (ref 3.5–5.3)

## 2012-02-21 MED ORDER — RISPERIDONE 0.5 MG PO TABS: 0.5000 mg | ORAL_TABLET | Freq: Every day | ORAL | Status: DC

## 2012-02-21 MED ORDER — LITHIUM CARBONATE 300 MG PO CAPS: 300.0000 mg | ORAL_CAPSULE | Freq: Every day | ORAL | Status: DC

## 2012-02-21 MED ORDER — CLONAZEPAM 1 MG PO TABS: 1.0000 mg | ORAL_TABLET | Freq: Two times a day (BID) | ORAL | Status: DC | PRN

## 2012-02-21 NOTE — Patient Instructions (Signed)
Take Cymbalta 30 mg daily for one week and then increase to 60 mg daily. Please call us if you have any question or concern about the medication or if he feel worsening of the symptoms. Please get lab work including your thyroid studies. Followup in 4 weeks.

## 2012-02-21 NOTE — Progress Notes (Signed)
[facility] [id number] Progress Note  Michelle Clements [id number] 46 y.o.  02/21/2012 1:13 PM  Chief Complaint: I am very tired and depressed.   History of Present Illness: Patient is 46 year old Caucasian married female who came for her followup appointment. Recently she's been feeling very tired and has no energy to do things. She is also feeling more depressed isolated and withdrawn. Her physician at stopped her thyroid medication and patient do not know why. She is taking antibiotic as she again had a UTI. She cut down her lithium to 1 tablet daily with concerned that she was feeling very tired with 2 doses. She is easily tearful and emotional. She continues to endorse paranoid thinking and at times hallucination. She complained racing thoughts and insomnia and difficulty falling asleep. She is taking Risperdal 0.5 mg which help some sleep but most of the time she having issues with her depression. She is worried about her physical illness and chronic tired feeling. She denies any active or passive suicidal thinking or any aggression.  Suicidal Ideation: No Plan Formed: No Patient has means to carry out plan: No  Homicidal Ideation: No Plan Formed: No Patient has means to carry out plan: No  Review of Systems: Psychiatric: Agitation: No Hallucination: No Depressed Mood: Yes Insomnia: Yes Hypersomnia: No Altered Concentration: No Feels Worthless: No Grandiose Ideas: No Belief In Special Powers: No New/Increased Substance Abuse: No Compulsions: No  Neurologic: Headache: no Seizure: No Paresthesias: No  Past Medical Family, Social History: Patient has history of diabetes fibromyalgia GERD and degenerative disease. She see Dr. Margo Aye for her primary physical condition and checkup.  Outpatient Encounter Prescriptions as of 02/21/2012  Medication Sig Dispense Refill  . clonazePAM (KLONOPIN) 1 MG tablet Take 1 tablet (1 mg total) by mouth 2 (two) times daily as needed for  anxiety.  60 tablet  0  . gabapentin (NEURONTIN) 600 MG tablet       . lithium carbonate 300 MG capsule Take 1 capsule (300 mg total) by mouth at bedtime.  30 capsule  0  . risperiDONE (RISPERDAL) 0.5 MG tablet Take 1 tablet (0.5 mg total) by mouth daily.  30 tablet  0  . DISCONTD: buPROPion (WELLBUTRIN SR) 150 MG 12 hr tablet Take 1 tablet (150 mg total) by mouth 2 (two) times daily.  60 tablet  1  . DISCONTD: clonazePAM (KLONOPIN) 1 MG tablet Take 1 tablet (1 mg total) by mouth 2 (two) times daily as needed for anxiety.  60 tablet  1  . DISCONTD: lithium carbonate 300 MG capsule Take 1 capsule (300 mg total) by mouth 2 (two) times daily with a meal.  60 capsule  0  . DISCONTD: risperiDONE (RISPERDAL) 0.5 MG tablet       . albuterol (ACCUNEB) 0.63 MG/3ML nebulizer solution Take 1 ampule by nebulization every 6 (six) hours as needed.        Marland Kitchen albuterol (PROAIR HFA) 108 (90 BASE) MCG/ACT inhaler Inhale 2 puffs into the lungs every 6 (six) hours as needed.        . cyclobenzaprine (FLEXERIL) 10 MG tablet Take 10 mg by mouth every 6 (six) hours as needed.        . doxycycline (VIBRA-TABS) 100 MG tablet       . HYDROcodone-acetaminophen (LORTAB) 7.5-500 MG per tablet Take 1 tablet by mouth every 8 (eight) hours as needed.        Marland Kitchen ibuprofen (ADVIL,MOTRIN) 200 MG tablet Take 600 mg by mouth every  6 (six) hours as needed.        . lubiprostone (AMITIZA) 24 MCG capsule Take 24 mcg by mouth 2 (two) times daily.        . metFORMIN (GLUCOPHAGE) 500 MG tablet       . montelukast (SINGULAIR) 10 MG tablet Take 10 mg by mouth daily.        . NON FORMULARY CPAP MASK, FACIAL CUSHION AND TUBING.       Marland Kitchen omeprazole (PRILOSEC OTC) 20 MG tablet Take 20 mg by mouth daily.        Marland Kitchen omeprazole (PRILOSEC) 20 MG capsule       . polyethylene glycol powder (GLYCOLAX/MIRALAX) powder       . predniSONE (DELTASONE) 10 MG tablet       . tretinoin (RETIN-A) 0.1 % cream         Past Psychiatric  History/Hospitalization(s): Anxiety: Yes Bipolar Disorder: Yes Depression: Yes Mania: No Psychosis: Yes Schizophrenia: No Personality Disorder: No Hospitalization for psychiatric illness: No History of Electroconvulsive Shock Therapy: No Prior Suicide Attempts: No  Physical Exam: Constitutional:  BP 145/65  Pulse 90  Wt 251 lb 12.8 oz (114.216 kg)  Mental status examination Patient is casually dressed and fairly groomed. She appears very tired and maintained fair eye contact. Her speech is slow but clear and coherent. She described her mood is tired depressed and anxious and her affect is constricted. She denies any active or passive suicidal thinking and homicidal thinking. She appears emotional and easily tearful. She endorse some paranoia but she denies any auditory or visual hallucination. Her attention and concentration is fair. She's alert and oriented x3. Her insight judgment and impulse control is okay.  Medical Decision Making (Choose Three): New problem, with additional work up planned, Review or order clinical lab tests (1), Established Problem, Worsening (2), Review of Medication Regimen & Side Effects (2) and Review of New Medication or Change in Dosage (2)  Assessment: Axis I: Bipolar disorder depressed type Axis II: Deferred Axis III: See medical history Axis IV: Mild to moderate Axis V: 55-65   Plan:  I reviewed medication, history, past progress note. I believe patient has good response with Cymbalta in the past however she could not afford and stop medication. I will start Cymbalta 30 mg daily and we will provide samples. I recommend to increase to 60 mg after one week if patient do not have any side effects. I will discontinue Wellbutrin as she does not need to antidepressant. I will do CBC CMP TSH and hemoglobin A1c. I also recommend to see her primary care physician and ask why her thyroid medication has discontinued. I will reduce her lithium to 1 tablet since  patient cannot tolerate 2 tablet and she developed UTI. I explained to call us if she feels worsening of her symptoms or having any issues with medication. I also recommended to call 911 if she feels worsening of her symptoms to the point that she having suicidal thinking and homicidal thinking. I will see her again in 4 weeks. Time spent 30 minutes.  Avice Funchess T., MD 02/21/2012

## 2012-03-01 ENCOUNTER — Other Ambulatory Visit (HOSPITAL_COMMUNITY): Payer: Self-pay | Admitting: Psychiatry

## 2012-03-22 ENCOUNTER — Ambulatory Visit (INDEPENDENT_AMBULATORY_CARE_PROVIDER_SITE_OTHER): Payer: 59 | Admitting: Psychiatry

## 2012-03-22 ENCOUNTER — Encounter (HOSPITAL_COMMUNITY): Payer: Self-pay | Admitting: Psychiatry

## 2012-03-22 DIAGNOSIS — F319 Bipolar disorder, unspecified: Secondary | ICD-10-CM

## 2012-03-22 MED ORDER — CLONAZEPAM 1 MG PO TABS: 1.0000 mg | ORAL_TABLET | Freq: Every day | ORAL | Status: DC

## 2012-03-22 MED ORDER — DULOXETINE HCL 60 MG PO CPEP: 60.0000 mg | ORAL_CAPSULE | Freq: Every day | ORAL | Status: DC

## 2012-03-22 MED ORDER — RISPERIDONE 0.5 MG PO TABS: 0.5000 mg | ORAL_TABLET | Freq: Every day | ORAL | Status: DC

## 2012-03-22 NOTE — Progress Notes (Signed)
  Chief Complaint:  I am doing better on Cymbalta.     History of Present Illness: Patient is 46 year old Caucasian married female who came for her followup appointment.  She reported much improvement with Cymbalta.  She's less anxious and less depressed.  She's sleeping better.  She has cut down her Klonopin and only taking at bedtime.  Her husband also endorse increase improvement on Cymbalta.  She is taking Cymbalta now 60 mg and denies any side effects.  She had stopped taking Wellbutrin .  There no more urinary tract infection since lithium is reduced only at bedtime .  She has contact her primary care physician to restart thyroid medication however she has not received any response and she believe that Dr. does not want to restart thyroid.  Overall her mood has been stable.  She denies any crying spells or racing thoughts.  She likes her current medication and she has no concern at this time .  She denies any active or passive suicidal thinking.  Current psychiatric medication Klonopin 1 mg at bedtime only Risperdal 0.5 mg at bedtime Cymbalta 60 mg daily, given samples. Lithium 300 mg daily  Suicidal Ideation: No Plan Formed: No Patient has means to carry out plan: No  Homicidal Ideation: No Plan Formed: No Patient has means to carry out plan: No  Review of Systems: Psychiatric: Agitation: No Hallucination: No Depressed Mood: Yes Insomnia: Yes Hypersomnia: No Altered Concentration: No Feels Worthless: No Grandiose Ideas: No Belief In Special Powers: No New/Increased Substance Abuse: No Compulsions: No  Neurologic: Headache: no Seizure: No Paresthesias: No  Past Medical Family, Social History: Patient has history of diabetes fibromyalgia GERD and degenerative disease. She see Dr. Margo Aye for her primary physical condition and checkup.    Past Psychiatric History/Hospitalization(s): Anxiety: Yes Bipolar Disorder: Yes Depression: Yes Mania: No Psychosis:  Yes Schizophrenia: No Personality Disorder: No Hospitalization for psychiatric illness: No History of Electroconvulsive Shock Therapy: No Prior Suicide Attempts: No  Mental status examination Patient is casually dressed and fairly groomed. She appears calm cooperative and pleasant.  She maintained good eye contact.  She described her mood is anxious and her affect is constricted .she denies any active or passive suicidal thinking and homicidal thinking.  She denies any auditory or visual hallucination.  There were no flight of ideas or loose association.  There no psychotic symptoms present at this time.  There no tremors or shakes present.  Her attention and concentration is improved from the past.  She's alert and oriented x3.  Her insight judgment and impulse control is okay.  Assessment: Axis I: Bipolar disorder depressed type Axis II: Deferred Axis III: See medical history Axis IV: Mild to moderate Axis V: 55-65   Plan:  I will continue Cymbalta 60 mg along with Risperdal and Klonopin.  She had cut down her Klonopin to only at bedtime.  She is taking lithium at bedtime and notice no more UTI .  She does not want to change her medication at this time.  I explained risks and benefits of medication in detail.  I will see her again in 2 months.  I recommend to call us if she has any question or concern about the medication. Brylee Mcgreal T., MD 03/22/2012

## 2012-05-08 ENCOUNTER — Other Ambulatory Visit (HOSPITAL_COMMUNITY): Payer: Self-pay | Admitting: Psychiatry

## 2012-05-08 ENCOUNTER — Other Ambulatory Visit (HOSPITAL_COMMUNITY): Payer: Self-pay | Admitting: *Deleted

## 2012-05-08 DIAGNOSIS — F319 Bipolar disorder, unspecified: Secondary | ICD-10-CM

## 2012-05-08 MED ORDER — LITHIUM CARBONATE 300 MG PO CAPS: 300.0000 mg | ORAL_CAPSULE | Freq: Every day | ORAL | Status: DC

## 2012-05-15 ENCOUNTER — Other Ambulatory Visit: Payer: Self-pay | Admitting: Neurosurgery

## 2012-05-15 DIAGNOSIS — M542 Cervicalgia: Secondary | ICD-10-CM

## 2012-05-15 DIAGNOSIS — M545 Low back pain: Secondary | ICD-10-CM

## 2012-05-21 ENCOUNTER — Other Ambulatory Visit: Payer: Self-pay

## 2012-05-22 ENCOUNTER — Ambulatory Visit (INDEPENDENT_AMBULATORY_CARE_PROVIDER_SITE_OTHER): Payer: 59 | Admitting: Psychiatry

## 2012-05-22 ENCOUNTER — Encounter (HOSPITAL_COMMUNITY): Payer: Self-pay | Admitting: Psychiatry

## 2012-05-22 ENCOUNTER — Ambulatory Visit (HOSPITAL_COMMUNITY): Payer: 59 | Admitting: Psychiatry

## 2012-05-22 DIAGNOSIS — F3130 Bipolar disorder, current episode depressed, mild or moderate severity, unspecified: Secondary | ICD-10-CM

## 2012-05-22 DIAGNOSIS — F319 Bipolar disorder, unspecified: Secondary | ICD-10-CM

## 2012-05-22 MED ORDER — LITHIUM CARBONATE 300 MG PO CAPS: 300.0000 mg | ORAL_CAPSULE | Freq: Every day | ORAL | Status: DC

## 2012-05-22 MED ORDER — CLONAZEPAM 1 MG PO TABS: 1.0000 mg | ORAL_TABLET | Freq: Every day | ORAL | Status: DC

## 2012-05-22 MED ORDER — RISPERIDONE 0.5 MG PO TABS: 0.5000 mg | ORAL_TABLET | Freq: Every day | ORAL | Status: DC

## 2012-05-22 NOTE — Progress Notes (Signed)
Chief Complaint:  Medication management and followup.     History of Present Illness: Patient is 46 year old Caucasian married female who came for her followup appointment.  She endorse two episode of severe paranoia and anger.  She admitted on those episodes she had increased anxiety and fear that her husband will leave her.  She remember crying and talking about however her husband able to calm her down.  Overall she has been doing very well on Cymbalta lithium and Klonopin.  She takes Risperdal at bedtime.  She sleeping better.  Recently she has a new puppy and she likes it.  She is able to play with this puppy.  She denies any side effects of medication.  She had a good support from her husband.  She wants to continue Cymbalta Risperdal Klonopin and lithium.  She has no more urinary tract infection.  However she feels that she should see her primary care physician one more time due to hair falling constipation.  She is concerned about her thyroid level.  She was taking thyroid medication but it was stopped by her primary care physician.  She denies any drinking or using any illegal substance.  She denies any active or passive suicidal thoughts.  Her paranoia has been much control with her current medication.   Current psychiatric medication Klonopin 1 mg at bedtime only Risperdal 0.5 mg at bedtime Cymbalta 60 mg daily, given samples. Lithium 300 mg daily  Suicidal Ideation: No Plan Formed: No Patient has means to carry out plan: No  Homicidal Ideation: No Plan Formed: No Patient has means to carry out plan: No  Review of Systems: Psychiatric: Agitation: No Hallucination: No Depressed Mood: Yes Insomnia: Yes Hypersomnia: No Altered Concentration: No Feels Worthless: No Grandiose Ideas: No Belief In Special Powers: No New/Increased Substance Abuse: No Compulsions: No  Neurologic: Headache: no Seizure: No Paresthesias: No  Past Medical Family, Social History: Patient has history  of diabetes fibromyalgia GERD and degenerative disease. She see Dr. Margo Aye for her primary physical condition and checkup.    Past Psychiatric History/Hospitalization(s): Anxiety: Yes Bipolar Disorder: Yes Depression: Yes Mania: No Psychosis: Yes Schizophrenia: No Personality Disorder: No Hospitalization for psychiatric illness: No History of Electroconvulsive Shock Therapy: No Prior Suicide Attempts: No  Mental status examination Patient is casually dressed and fairly groomed. She appears calm cooperative and pleasant.  She maintained fair eye contact.  Her speech is monotonous.  She described her mood is anxious and her affect is constricted .she denies any active or passive suicidal thinking and homicidal thinking.  She denies any auditory or visual hallucination.  There were no flight of ideas or loose association.  There no psychotic symptoms present at this time.  There no tremors or shakes present.  Her attention and concentration is improved from the past.  She's alert and oriented x3.  Her insight judgment and impulse control is okay.  Assessment: Axis I: Bipolar disorder depressed type Axis II: Deferred Axis III: See medical history Axis IV: Mild to moderate Axis V: 55-65   Plan:  I will continue her current psychiatric medication.  Samples of Cymbalta 60 mg provided.  I recommend the use have Klonopin on those episodes when she has severe anxiety and paranoid thinking.  Patient is scheduled to see her physician Dr. Margo Aye for her thyroid level checked.  I explained the risk and benefits of medication in detail.  I recommend to call us if she is any question or concern about the medication otherwise I  will see her again in 3 months.  Portion of this note is generated with voice recognition software and may contain typographical error.  Deysi Soldo T., MD 05/22/2012

## 2012-05-24 ENCOUNTER — Other Ambulatory Visit: Payer: Self-pay

## 2012-05-29 ENCOUNTER — Telehealth (HOSPITAL_COMMUNITY): Payer: Self-pay | Admitting: *Deleted

## 2012-05-31 ENCOUNTER — Other Ambulatory Visit: Payer: Self-pay | Admitting: Neurosurgery

## 2012-05-31 DIAGNOSIS — M542 Cervicalgia: Secondary | ICD-10-CM

## 2012-05-31 DIAGNOSIS — M545 Low back pain: Secondary | ICD-10-CM

## 2012-06-05 ENCOUNTER — Ambulatory Visit
Admission: RE | Admit: 2012-06-05 | Discharge: 2012-06-05 | Disposition: A | Discharge status: Home / Self Care | Payer: Self-pay | Source: Ambulatory Visit | Attending: Neurosurgery | Admitting: Neurosurgery

## 2012-06-05 VITALS — BP 119/56 | HR 75

## 2012-06-05 DIAGNOSIS — M542 Cervicalgia: Secondary | ICD-10-CM

## 2012-06-05 DIAGNOSIS — M545 Low back pain: Secondary | ICD-10-CM

## 2012-06-05 MED ORDER — ONDANSETRON HCL 4 MG/2ML IJ SOLN: 4.0000 mg | Freq: Four times a day (QID) | INTRAMUSCULAR | Status: DC | PRN

## 2012-06-05 MED ORDER — ONDANSETRON HCL 4 MG/2ML IJ SOLN
4.0000 mg | Freq: Once | INTRAMUSCULAR | Status: AC
  Administered 2012-06-05: 4 mg via INTRAMUSCULAR

## 2012-06-05 MED ORDER — MEPERIDINE HCL 100 MG/ML IJ SOLN
75.0000 mg | Freq: Once | INTRAMUSCULAR | Status: AC
  Administered 2012-06-05: 75 mg via INTRAMUSCULAR

## 2012-06-05 MED ORDER — DIAZEPAM 5 MG PO TABS
10.0000 mg | ORAL_TABLET | Freq: Once | ORAL | Status: AC
  Administered 2012-06-05: 10 mg via ORAL

## 2012-06-05 MED ORDER — IOHEXOL 300 MG/ML  SOLN
10.0000 mL | Freq: Once | INTRAMUSCULAR | Status: AC | PRN
  Administered 2012-06-05: 10 mL via INTRATHECAL

## 2012-06-05 NOTE — Discharge Instructions (Signed)
Myelogram Discharge Instructions  1. Go home and rest quietly for the next 24 hours.  It is important to lie flat for the next 24 hours.  Get up only to go to the restroom.  You may lie in the bed or on a couch on your back, your stomach, your left side or your right side.  You may have one pillow under your head.  You may have pillows between your knees while you are on your side or under your knees while you are on your back.  2. DO NOT drive today.  Recline the seat as far back as it will go, while still wearing your seat belt, on the way home.  3. You may get up to go to the bathroom as needed.  You may sit up for 10 minutes to eat.  You may resume your normal diet and medications unless otherwise indicated.  Drink lots of extra fluids today and tomorrow.  4. The incidence of headache, nausea, or vomiting is about 5% (one in 20 patients).  If you develop a headache, lie flat and drink plenty of fluids until the headache goes away.  Caffeinated beverages may be helpful.  If you develop severe nausea and vomiting or a headache that does not go away with flat bed rest, call [phone].  5. You may resume normal activities after your 24 hours of bed rest is over; however, do not exert yourself strongly or do any heavy lifting tomorrow. If when you get up you have a headache when standing, go back to bed and force fluids for another 24 hours.  6. Call your physician for a follow-up appointment.  The results of your myelogram will be sent directly to your physician by the following day.  7. If you have any questions or if complications develop after you arrive home, please call [phone].  Discharge instructions have been explained to the patient.  The patient, or the person responsible for the patient, fully understands these instructions.       May resume lithium, risperdol and cymbalta, June 06, 2012, after 9:30 am.

## 2012-06-08 ENCOUNTER — Telehealth: Payer: Self-pay

## 2012-06-08 ENCOUNTER — Other Ambulatory Visit (HOSPITAL_COMMUNITY): Payer: Self-pay | Admitting: *Deleted

## 2012-06-08 ENCOUNTER — Other Ambulatory Visit (HOSPITAL_COMMUNITY): Payer: Self-pay | Admitting: Psychiatry

## 2012-06-08 DIAGNOSIS — F319 Bipolar disorder, unspecified: Secondary | ICD-10-CM

## 2012-06-08 MED ORDER — LITHIUM CARBONATE 300 MG PO CAPS: 300.0000 mg | ORAL_CAPSULE | Freq: Every day | ORAL | Status: DC

## 2012-06-08 NOTE — Telephone Encounter (Signed)
Patient called to report feeling "flu-like" since myelo here 7/2/ 2013.  States there are bumps "like mosquito bites" across her left hip and lower back.  States they do not hurt and are not weeping/oozing but they do itch.  Feels wiped out and achy and her "skin hurts."  Has not checked her temperature but denies chills, headache, diarrhea.  Complains of nausea without vomiting when she eats, and states she has a small cough.   Will call her back after speaking with out MD.  Tomasa Blase

## 2012-06-08 NOTE — Telephone Encounter (Signed)
Called patient back after speaking with Dr. Alfredo Batty.  He offered to see her here in follow up, but patient lives in [place] and would rather just see her primary care doctor there or treat the symptoms a day or two longer to see if they go away.  jkl

## 2012-06-12 ENCOUNTER — Telehealth: Payer: Self-pay | Admitting: Radiology

## 2012-06-12 NOTE — Telephone Encounter (Signed)
C/o soreness at injection site post myelo. Strained while using toliet day post myelo and now has a headache that comes on late in the morning or early afternoon and can be very painful at times. Asked pt to stay in bed for 24 hours and drink lots of fluids, that this should take care of her headache. Other soreness will go away in time.dd

## 2012-07-05 ENCOUNTER — Telehealth: Payer: Self-pay | Admitting: Radiology

## 2012-07-05 NOTE — Telephone Encounter (Signed)
Pt states she has a headache, but not every day and it is not positional in nature. Her myelo was 3 weeks ago. Ask her to f/u with her primary care.

## 2012-07-09 ENCOUNTER — Telehealth (HOSPITAL_COMMUNITY): Payer: Self-pay | Admitting: *Deleted

## 2012-07-10 ENCOUNTER — Other Ambulatory Visit (HOSPITAL_COMMUNITY): Payer: Self-pay | Admitting: Psychiatry

## 2012-07-10 NOTE — Telephone Encounter (Signed)
Samples of Cymbalta 60 mg once a day given for 6 weeks.

## 2012-08-21 ENCOUNTER — Encounter (HOSPITAL_COMMUNITY): Payer: Self-pay | Admitting: Psychiatry

## 2012-08-21 ENCOUNTER — Ambulatory Visit (INDEPENDENT_AMBULATORY_CARE_PROVIDER_SITE_OTHER): Payer: 59 | Admitting: Psychiatry

## 2012-08-21 VITALS — Wt 264.0 lb

## 2012-08-21 DIAGNOSIS — F3130 Bipolar disorder, current episode depressed, mild or moderate severity, unspecified: Secondary | ICD-10-CM

## 2012-08-21 DIAGNOSIS — F319 Bipolar disorder, unspecified: Secondary | ICD-10-CM

## 2012-08-21 MED ORDER — RISPERIDONE 0.5 MG PO TABS: 0.5000 mg | ORAL_TABLET | Freq: Every day | ORAL | Status: DC

## 2012-08-21 MED ORDER — CLONAZEPAM 1 MG PO TABS: 1.0000 mg | ORAL_TABLET | Freq: Every day | ORAL | Status: DC

## 2012-08-21 NOTE — Progress Notes (Signed)
Chief Complaint:  I stop taking lithium because by primary care physician told that I have a high liver enzyme.  I'm feeling more depressed.   History of Present Illness: Patient is 46 year old Caucasian married female who came for her followup appointment.  Patient endorsed increased anxiety depression and having crying spells.  She was told by her primary care physician to stop lithium due to high liver enzyme.  It is unclear why lithium was to stop since lithium do not excrete from liver.  I had called Dr. Dannette Barbara but there was no response .  Patient also endorse increased anxiety do to mother has been sick.  She was admitted for 14 days and then she released from the hospital she is been seeing her every day and taking care of her.  Patient gets very nervous anxious about her .  Her mother takes multiple medication and she has significant health issues.  She is worried about her.  Patient admitted poor sleep and social isolation she also endorse increased paranoia but denies any agitation or severe anger.  She is more isolated but denies any feeling of hopelessness or helplessness.  She's not drinking or using any illegal substance.    Current psychiatric medication Klonopin 1 mg at bedtime only Risperdal 0.5 mg at bedtime Cymbalta 60 mg daily, given samples. Lithium 300 mg daily prescribed but not taking.  Suicidal Ideation: No Plan Formed: No Patient has means to carry out plan: No  Homicidal Ideation: No Plan Formed: No Patient has means to carry out plan: No  Review of Systems: Psychiatric: Agitation: No Hallucination: No Depressed Mood: Yes Insomnia: Yes Hypersomnia: No Altered Concentration: No Feels Worthless: No Grandiose Ideas: No Belief In Special Powers: No New/Increased Substance Abuse: No Compulsions: No  Neurologic: Headache: yes Seizure: No Paresthesias: No  Past Medical Family, Social History: Patient has history of diabetes fibromyalgia GERD and degenerative  disease. She see Dr. Margo Aye for her primary physical condition and checkup.  Patient lives with her husband.  Past Psychiatric History/Hospitalization(s): Anxiety: Yes Bipolar Disorder: Yes Depression: Yes Mania: No Psychosis: Yes Schizophrenia: No Personality Disorder: No Hospitalization for psychiatric illness: No History of Electroconvulsive Shock Therapy: No Prior Suicide Attempts: No  Mental status examination Patient is casually dressed and fairly groomed. She appears anxious, she described her mood is depressed and her affect is constricted.  Her speech is slow but clear and coherent. She maintained fair eye contact.  she denies any active or passive suicidal thinking and homicidal thinking.  She denies any auditory or visual hallucination.  There were no flight of ideas or loose association.  There no psychotic symptoms present at this time.  There no tremors or shakes present.  Her attention and concentration is fair.  She's alert and oriented x3.  Her insight judgment and impulse control is okay.  Assessment: Axis I: Bipolar disorder depressed type Axis II: Deferred Axis III: See medical history Axis IV: Mild to moderate Axis V: 55-65   Plan:  I review her labs, her last liver function test was done in March 2013 which shows he is 243 and ALT 50.  As per patient her liver enzyme has been increased recently , she has visited her primary care physician and she was advised to stop lithium.  We will get collateral information from her primary care physician .  Patient is also recommend to contact her primary care physician .  She is planning to visit her primary care physician today.  Patient  also brought form to fill out for her continued disability.  We will complete the form .  Patient will require Cymbalta samples.  I will continue Risperdal and Klonopin as prescribed.  I also recommend to restart lithium and have her blood test results faxed to [place].  I will see her again in 2  months.  I recommend to call us if she is any question or concern if he feel worsening of the symptom.  Talked about safety plan that anytime having suicidal thoughts or homicidal thoughts and he need to call 911 or go to local emergency room.  Time spent 30 minutes.  Portion of this note is generated with voice recognition software and may contain typographical error.  Giah Fickett T., MD 08/21/2012

## 2012-09-06 ENCOUNTER — Other Ambulatory Visit (HOSPITAL_COMMUNITY): Payer: Self-pay | Admitting: Psychiatry

## 2012-09-06 NOTE — Telephone Encounter (Signed)
Given 2 more refills on 08/21/12

## 2012-09-25 ENCOUNTER — Other Ambulatory Visit (HOSPITAL_COMMUNITY): Payer: Self-pay | Admitting: Psychiatry

## 2012-10-02 ENCOUNTER — Other Ambulatory Visit (HOSPITAL_COMMUNITY): Payer: Self-pay | Admitting: Psychiatry

## 2012-10-02 ENCOUNTER — Telehealth (HOSPITAL_COMMUNITY): Payer: Self-pay | Admitting: *Deleted

## 2012-10-10 ENCOUNTER — Encounter (HOSPITAL_COMMUNITY): Payer: Self-pay | Admitting: Psychiatry

## 2012-10-10 ENCOUNTER — Ambulatory Visit (INDEPENDENT_AMBULATORY_CARE_PROVIDER_SITE_OTHER): Payer: 59 | Admitting: Psychiatry

## 2012-10-10 VITALS — BP 156/82 | HR 96 | Ht 71.25 in | Wt 262.2 lb

## 2012-10-10 DIAGNOSIS — IMO0001 Reserved for inherently not codable concepts without codable children: Secondary | ICD-10-CM

## 2012-10-10 DIAGNOSIS — F329 Major depressive disorder, single episode, unspecified: Secondary | ICD-10-CM

## 2012-10-10 DIAGNOSIS — F17200 Nicotine dependence, unspecified, uncomplicated: Secondary | ICD-10-CM

## 2012-10-10 DIAGNOSIS — F3130 Bipolar disorder, current episode depressed, mild or moderate severity, unspecified: Secondary | ICD-10-CM

## 2012-10-10 NOTE — Progress Notes (Signed)
  Chief Complaint:  I am miserable, but am releived that someone is hearing me and explaining some things to me.    History of Present Illness: Patient is 46 year old Caucasian married female who came for her followup appointment.  Pt has pain, headaches, mood, and memory problems.  These seem related to her pain, opiates, benzodiazepines, and brain damage from years of those medications.  Will refer to inpatient for withdrawal and adjustment to optimal medication management with optimal physical and spiritual management.   Current psychiatric medication Klonopin 1 mg at bedtime only Risperdal 0.5 mg at bedtime Cymbalta 60 mg daily, given samples. Lithium 300 mg daily prescribed but not taking.  Suicidal Ideation: No Plan Formed: No Patient has means to carry out plan: No  Homicidal Ideation: No Plan Formed: No Patient has means to carry out plan: No  Review of Systems: Psychiatric: Agitation: No Hallucination: No Depressed Mood: Yes Insomnia: Yes Hypersomnia: No Altered Concentration: No Feels Worthless: No Grandiose Ideas: No Belief In Special Powers: No New/Increased Substance Abuse: No Compulsions: No  Neurologic: Headache: yes Seizure: No Paresthesias: No  Past Medical Family, Social History: Patient has history of diabetes fibromyalgia GERD and degenerative disease. She see Dr. Margo Aye for her primary physical condition and checkup.  Patient lives with her husband.  Past Psychiatric History/Hospitalization(s): Anxiety: Yes Bipolar Disorder: Yes Depression: Yes Mania: No Psychosis: Yes Schizophrenia: No Personality Disorder: No Hospitalization for psychiatric illness: No History of Electroconvulsive Shock Therapy: No Prior Suicide Attempts: No  Mental status examination Patient is casually dressed and fairly groomed. She appears anxious, she described her mood is depressed and her affect is constricted.  Her speech is slow but clear and coherent. She maintained  fair eye contact.  she denies any active or passive suicidal thinking and homicidal thinking.  She denies any auditory or visual hallucination.  There were no flight of ideas or loose association.  There no psychotic symptoms present at this time.  There no tremors or shakes present.  Her attention and concentration is fair.  She's alert and oriented x3.  Her insight judgment and impulse control is okay.  Assessment: Axis I: Bipolar disorder depressed type Axis II: Deferred Axis III: See medical history Axis IV: Mild to moderate Axis V: 55-65   Plan:  I review her history, medications, and problem list.  She has 2 addictive medicatinos that may be causing some -[roblems for her.  Will refer to inpatient for withdrawal and adjustment of optimal med management. Cymbalta samples given. Time spent 30 minutes.  Orson Aloe, MD 10/10/2012

## 2012-10-10 NOTE — Patient Instructions (Signed)
Consider inpatient referral for adjustment of medications.

## 2012-10-11 ENCOUNTER — Telehealth (HOSPITAL_COMMUNITY): Payer: Self-pay | Admitting: *Deleted

## 2012-10-12 ENCOUNTER — Telehealth (HOSPITAL_COMMUNITY): Payer: Self-pay | Admitting: *Deleted

## 2012-10-12 NOTE — Telephone Encounter (Signed)
Plans to enter [facility] for admission and detox and adjustment on pain meds for optimal management. Detox on Saturday and Sunday, then adjust pain management after that.

## 2012-10-13 ENCOUNTER — Encounter (HOSPITAL_COMMUNITY): Payer: Self-pay | Admitting: *Deleted

## 2012-10-13 ENCOUNTER — Inpatient Hospital Stay (HOSPITAL_COMMUNITY)
Admission: AD | Admit: 2012-10-13 | Discharge: 2012-10-17 | DRG: 885 | Disposition: A | Discharge status: Home / Self Care | Payer: 59 | Source: Ambulatory Visit | Attending: Psychiatry | Admitting: Psychiatry

## 2012-10-13 DIAGNOSIS — R52 Pain, unspecified: Secondary | ICD-10-CM

## 2012-10-13 DIAGNOSIS — R51 Headache: Secondary | ICD-10-CM

## 2012-10-13 DIAGNOSIS — M25559 Pain in unspecified hip: Secondary | ICD-10-CM | POA: Diagnosis present

## 2012-10-13 DIAGNOSIS — F4310 Post-traumatic stress disorder, unspecified: Secondary | ICD-10-CM | POA: Diagnosis present

## 2012-10-13 DIAGNOSIS — E119 Type 2 diabetes mellitus without complications: Secondary | ICD-10-CM | POA: Diagnosis present

## 2012-10-13 DIAGNOSIS — Z23 Encounter for immunization: Secondary | ICD-10-CM

## 2012-10-13 DIAGNOSIS — F319 Bipolar disorder, unspecified: Secondary | ICD-10-CM

## 2012-10-13 DIAGNOSIS — G4730 Sleep apnea, unspecified: Secondary | ICD-10-CM | POA: Diagnosis present

## 2012-10-13 DIAGNOSIS — R002 Palpitations: Secondary | ICD-10-CM

## 2012-10-13 DIAGNOSIS — M542 Cervicalgia: Secondary | ICD-10-CM | POA: Diagnosis present

## 2012-10-13 DIAGNOSIS — F329 Major depressive disorder, single episode, unspecified: Secondary | ICD-10-CM

## 2012-10-13 DIAGNOSIS — IMO0001 Reserved for inherently not codable concepts without codable children: Secondary | ICD-10-CM | POA: Diagnosis present

## 2012-10-13 DIAGNOSIS — E559 Vitamin D deficiency, unspecified: Secondary | ICD-10-CM

## 2012-10-13 DIAGNOSIS — D649 Anemia, unspecified: Secondary | ICD-10-CM

## 2012-10-13 DIAGNOSIS — F3160 Bipolar disorder, current episode mixed, unspecified: Principal | ICD-10-CM | POA: Diagnosis present

## 2012-10-13 DIAGNOSIS — K589 Irritable bowel syndrome without diarrhea: Secondary | ICD-10-CM | POA: Diagnosis present

## 2012-10-13 DIAGNOSIS — F17200 Nicotine dependence, unspecified, uncomplicated: Secondary | ICD-10-CM

## 2012-10-13 DIAGNOSIS — K219 Gastro-esophageal reflux disease without esophagitis: Secondary | ICD-10-CM

## 2012-10-13 DIAGNOSIS — J45909 Unspecified asthma, uncomplicated: Secondary | ICD-10-CM | POA: Diagnosis present

## 2012-10-13 DIAGNOSIS — J309 Allergic rhinitis, unspecified: Secondary | ICD-10-CM

## 2012-10-13 DIAGNOSIS — I10 Essential (primary) hypertension: Secondary | ICD-10-CM | POA: Diagnosis present

## 2012-10-13 DIAGNOSIS — Z79899 Other long term (current) drug therapy: Secondary | ICD-10-CM

## 2012-10-13 DIAGNOSIS — E785 Hyperlipidemia, unspecified: Secondary | ICD-10-CM

## 2012-10-13 DIAGNOSIS — K5909 Other constipation: Secondary | ICD-10-CM

## 2012-10-13 DIAGNOSIS — L708 Other acne: Secondary | ICD-10-CM

## 2012-10-13 DIAGNOSIS — F1920 Other psychoactive substance dependence, uncomplicated: Secondary | ICD-10-CM | POA: Diagnosis present

## 2012-10-13 LAB — COMPREHENSIVE METABOLIC PANEL
ALT: 38 U/L — ABNORMAL HIGH (ref 0–35)
AST: 36 U/L (ref 0–37)
Albumin: 4.4 g/dL (ref 3.5–5.2)
Calcium: 10 mg/dL (ref 8.4–10.5)
Creatinine, Ser: 0.93 mg/dL (ref 0.50–1.10)
Sodium: 140 mEq/L (ref 135–145)
Total Protein: 6.9 g/dL (ref 6.0–8.3)

## 2012-10-13 LAB — RAPID URINE DRUG SCREEN, HOSP PERFORMED
Cocaine: NOT DETECTED
Opiates: POSITIVE — AB
Tetrahydrocannabinol: NOT DETECTED

## 2012-10-13 LAB — URINALYSIS, ROUTINE W REFLEX MICROSCOPIC
Nitrite: NEGATIVE
Protein, ur: NEGATIVE mg/dL
Specific Gravity, Urine: 1.018 (ref 1.005–1.030)
Urobilinogen, UA: 1 mg/dL (ref 0.0–1.0)

## 2012-10-13 LAB — CBC WITH DIFFERENTIAL/PLATELET
Basophils Relative: 1 % (ref 0–1)
HCT: 34.9 % — ABNORMAL LOW (ref 36.0–46.0)
Hemoglobin: 11.5 g/dL — ABNORMAL LOW (ref 12.0–15.0)
Lymphs Abs: 2.9 10*3/uL (ref 0.7–4.0)
MCH: 26.6 pg (ref 26.0–34.0)
MCHC: 33 g/dL (ref 30.0–36.0)
Monocytes Absolute: 0.6 10*3/uL (ref 0.1–1.0)
Monocytes Relative: 5 % (ref 3–12)
Neutro Abs: 7 10*3/uL (ref 1.7–7.7)
RBC: 4.32 MIL/uL (ref 3.87–5.11)

## 2012-10-13 LAB — URINE MICROSCOPIC-ADD ON

## 2012-10-13 MED ORDER — MAGNESIUM HYDROXIDE 400 MG/5ML PO SUSP
30.0000 mL | Freq: Every day | ORAL | Status: DC | PRN
  Administered 2012-10-16: 30 mL via ORAL

## 2012-10-13 MED ORDER — GABAPENTIN 100 MG PO CAPS
200.0000 mg | ORAL_CAPSULE | Freq: Two times a day (BID) | ORAL | Status: AC
  Administered 2012-10-13 (×2): 200 mg via ORAL
  Filled 2012-10-13 (×2): qty 2

## 2012-10-13 MED ORDER — CLONIDINE HCL 0.2 MG/24HR TD PTWK
0.2000 mg | MEDICATED_PATCH | TRANSDERMAL | Status: DC
  Administered 2012-10-13: 0.2 mg via TRANSDERMAL
  Filled 2012-10-13: qty 1

## 2012-10-13 MED ORDER — LOPERAMIDE HCL 2 MG PO CAPS: 2.0000 mg | ORAL_CAPSULE | ORAL | Status: DC | PRN

## 2012-10-13 MED ORDER — METHOCARBAMOL 500 MG PO TABS
500.0000 mg | ORAL_TABLET | Freq: Three times a day (TID) | ORAL | Status: DC | PRN
  Administered 2012-10-14: 500 mg via ORAL
  Filled 2012-10-13: qty 1

## 2012-10-13 MED ORDER — DICYCLOMINE HCL 20 MG PO TABS: 20.0000 mg | ORAL_TABLET | Freq: Four times a day (QID) | ORAL | Status: DC | PRN

## 2012-10-13 MED ORDER — LIDOCAINE 5 % EX PTCH
1.0000 | MEDICATED_PATCH | Freq: Every day | CUTANEOUS | Status: DC
  Administered 2012-10-13 – 2012-10-17 (×5): 1 via TRANSDERMAL
  Filled 2012-10-13 (×7): qty 1

## 2012-10-13 MED ORDER — NAPROXEN 500 MG PO TABS
500.0000 mg | ORAL_TABLET | Freq: Two times a day (BID) | ORAL | Status: DC | PRN
  Administered 2012-10-13 – 2012-10-15 (×3): 500 mg via ORAL
  Filled 2012-10-13 (×3): qty 1

## 2012-10-13 MED ORDER — DULOXETINE HCL 30 MG PO CPEP
30.0000 mg | ORAL_CAPSULE | Freq: Two times a day (BID) | ORAL | Status: DC
  Administered 2012-10-13 – 2012-10-14 (×2): 30 mg via ORAL
  Filled 2012-10-13 (×7): qty 1

## 2012-10-13 MED ORDER — ACETAMINOPHEN 325 MG PO TABS: 650.0000 mg | ORAL_TABLET | Freq: Four times a day (QID) | ORAL | Status: DC | PRN

## 2012-10-13 MED ORDER — ALUM & MAG HYDROXIDE-SIMETH 200-200-20 MG/5ML PO SUSP: 30.0000 mL | ORAL | Status: DC | PRN

## 2012-10-13 MED ORDER — PNEUMOCOCCAL VAC POLYVALENT 25 MCG/0.5ML IJ INJ
0.5000 mL | INJECTION | INTRAMUSCULAR | Status: AC
  Administered 2012-10-14: 0.5 mL via INTRAMUSCULAR

## 2012-10-13 MED ORDER — AMITRIPTYLINE HCL 25 MG PO TABS
25.0000 mg | ORAL_TABLET | Freq: Every day | ORAL | Status: DC
  Administered 2012-10-13 – 2012-10-16 (×4): 25 mg via ORAL
  Filled 2012-10-13 (×6): qty 1

## 2012-10-13 MED ORDER — CELECOXIB 100 MG PO CAPS
100.0000 mg | ORAL_CAPSULE | Freq: Two times a day (BID) | ORAL | Status: DC
  Administered 2012-10-13 – 2012-10-14 (×2): 100 mg via ORAL
  Filled 2012-10-13 (×6): qty 1

## 2012-10-13 MED ORDER — ONDANSETRON 4 MG PO TBDP
4.0000 mg | ORAL_TABLET | Freq: Four times a day (QID) | ORAL | Status: DC | PRN
  Administered 2012-10-13 – 2012-10-14 (×2): 4 mg via ORAL
  Filled 2012-10-13: qty 1

## 2012-10-13 MED ORDER — HYDROXYZINE HCL 25 MG PO TABS
25.0000 mg | ORAL_TABLET | Freq: Four times a day (QID) | ORAL | Status: DC | PRN
  Administered 2012-10-13: 25 mg via ORAL

## 2012-10-13 NOTE — Progress Notes (Signed)
D Pt is seen sitting at her desk in her room. She is tearful, flat and depressed. She makes poor eye contact, shares that she feels " lonely" and is regretting admitting herself to the hospital. She speaks softly. She is timid and shy-like in her behaviors. SHe shares that she feels like she doesn't fit in...that she's " not like the other patients".   A SHe is medicated with catapres 0.2 mg patch, celebrex, neurontin, naproxen for headache, lidoderm patch and cymbalta per MD order. She contracts with this nurse for safety and requests to rest in her bed ( to see if this will help her headache).   R Safety is maintained , POC includes continuing to foster therapeutic relationsip, assess pt's reaction to meds and support pt.
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
## 2012-10-13 NOTE — Progress Notes (Signed)
Pt is a 46 year old female admitted on voluntary basis, pt here as she is attempting to detox off of benzo's and opiates. Pt reports she has been using benzo's since 1995 and opiates for several years but unsure of how long.  Pt reports last use being last evening around 2130. On admission, pt denies any suicidal thoughts but does acknowledge past history of and is able to contract for safety on the unit. Pt reports that she was seeing Dr. Lolly Mustache until recently and has just switched to Dr. Dan Humphreys, pt does have a PCP and last saw him back in October. Pt does report that she has been diagnosed as Bipolar. Pt is married with children, lives with family and will go back there at discharge. Pt does report having Medicare complete and is on disability. Pt does have past medical history of sleep apnea, fibromyalgia, diabetes and asthma. Pt was oriented to the unit and safety maintained.

## 2012-10-13 NOTE — Progress Notes (Signed)
Psychoeducational Group Note  Date:  10/13/2012 Time:  1515  Group Topic/Focus:  Coping With Mental Health Crisis:   The purpose of this group is to help patients identify strategies for coping with mental health crisis.  Group discusses possible causes of crisis and ways to manage them effectively.  Participation Level:  Minimal  Participation Quality:  Resistant  Affect:  Appropriate  Cognitive:  Appropriate  Insight:  Good  Engagement in Group:  Good  Additional Comments: pt was quiet, just got on the unit and never been in a facility like this.  Isla Pence M 10/13/2012, 6:41 PM

## 2012-10-13 NOTE — Progress Notes (Signed)
[facility] Group Notes:  (Counselor/Nursing/MHT/Case Management/Adjunct)  10/13/2012 4:19 PM  Type of Therapy:  Group Therapy  Participation Level:  Did Not Attend    Michelle Clements 10/13/2012, 4:19 PM

## 2012-10-14 ENCOUNTER — Encounter (HOSPITAL_COMMUNITY): Payer: Self-pay | Admitting: Physician Assistant

## 2012-10-14 DIAGNOSIS — F319 Bipolar disorder, unspecified: Secondary | ICD-10-CM

## 2012-10-14 LAB — GLUCOSE, CAPILLARY
Glucose-Capillary: 115 mg/dL — ABNORMAL HIGH (ref 70–99)
Glucose-Capillary: 149 mg/dL — ABNORMAL HIGH (ref 70–99)

## 2012-10-14 LAB — CBC
MCV: 80.9 fL (ref 78.0–100.0)
Platelets: 334 10*3/uL (ref 150–400)
RBC: 4.25 MIL/uL (ref 3.87–5.11)
RDW: 15.3 % (ref 11.5–15.5)
WBC: 8.9 10*3/uL (ref 4.0–10.5)

## 2012-10-14 LAB — COMPREHENSIVE METABOLIC PANEL
Albumin: 4.2 g/dL (ref 3.5–5.2)
Alkaline Phosphatase: 65 U/L (ref 39–117)
BUN: 12 mg/dL (ref 6–23)
Chloride: 100 mEq/L (ref 96–112)
Creatinine, Ser: 0.91 mg/dL (ref 0.50–1.10)
GFR calc Af Amer: 88 mL/min — ABNORMAL LOW (ref >90)
GFR calc non Af Amer: 76 mL/min — ABNORMAL LOW (ref >90)
Glucose, Bld: 132 mg/dL — ABNORMAL HIGH (ref 70–99)
Potassium: 4.1 mEq/L (ref 3.5–5.1)
Total Bilirubin: 0.5 mg/dL (ref 0.3–1.2)

## 2012-10-14 LAB — BILIRUBIN, DIRECT: Bilirubin, Direct: <0.1 mg/dL (ref 0.0–0.3)

## 2012-10-14 LAB — TSH: TSH: 3.261 u[IU]/mL (ref 0.350–4.500)

## 2012-10-14 MED ORDER — LITHIUM CARBONATE 300 MG PO CAPS
300.0000 mg | ORAL_CAPSULE | Freq: Every day | ORAL | Status: DC
  Administered 2012-10-14 – 2012-10-16 (×3): 300 mg via ORAL
  Filled 2012-10-14 (×5): qty 1

## 2012-10-14 MED ORDER — TRAZODONE HCL 100 MG PO TABS
100.0000 mg | ORAL_TABLET | Freq: Every evening | ORAL | Status: DC | PRN
  Administered 2012-10-14: 100 mg via ORAL
  Filled 2012-10-14: qty 1

## 2012-10-14 MED ORDER — MELOXICAM 7.5 MG PO TABS
7.5000 mg | ORAL_TABLET | Freq: Two times a day (BID) | ORAL | Status: DC
  Administered 2012-10-14 – 2012-10-17 (×7): 7.5 mg via ORAL
  Filled 2012-10-14 (×10): qty 1

## 2012-10-14 MED ORDER — GABAPENTIN 600 MG PO TABS
600.0000 mg | ORAL_TABLET | Freq: Four times a day (QID) | ORAL | Status: DC
  Administered 2012-10-14 – 2012-10-17 (×14): 600 mg via ORAL
  Filled 2012-10-14 (×20): qty 1

## 2012-10-14 MED ORDER — LORATADINE 10 MG PO TABS
10.0000 mg | ORAL_TABLET | Freq: Every day | ORAL | Status: DC
  Administered 2012-10-14 – 2012-10-15 (×2): 10 mg via ORAL
  Filled 2012-10-14 (×7): qty 1

## 2012-10-14 MED ORDER — RISPERIDONE 0.5 MG PO TABS
0.5000 mg | ORAL_TABLET | Freq: Every day | ORAL | Status: DC
  Administered 2012-10-15 – 2012-10-16 (×2): 0.5 mg via ORAL
  Filled 2012-10-14 (×6): qty 1

## 2012-10-14 MED ORDER — ALBUTEROL SULFATE HFA 108 (90 BASE) MCG/ACT IN AERS
2.0000 | INHALATION_SPRAY | Freq: Four times a day (QID) | RESPIRATORY_TRACT | Status: DC
  Administered 2012-10-14 – 2012-10-17 (×9): 2 via RESPIRATORY_TRACT
  Filled 2012-10-14: qty 6.7

## 2012-10-14 MED ORDER — METFORMIN HCL 500 MG PO TABS
500.0000 mg | ORAL_TABLET | Freq: Two times a day (BID) | ORAL | Status: DC
  Administered 2012-10-14 – 2012-10-17 (×7): 500 mg via ORAL
  Filled 2012-10-14 (×10): qty 1

## 2012-10-14 MED ORDER — HYDROXYZINE HCL 50 MG PO TABS
50.0000 mg | ORAL_TABLET | Freq: Once | ORAL | Status: AC
  Administered 2012-10-14: 50 mg via ORAL
  Filled 2012-10-14: qty 1

## 2012-10-14 MED ORDER — PANTOPRAZOLE SODIUM 40 MG PO TBEC
40.0000 mg | DELAYED_RELEASE_TABLET | Freq: Every day | ORAL | Status: DC
  Administered 2012-10-14 – 2012-10-17 (×4): 40 mg via ORAL
  Filled 2012-10-14 (×6): qty 1

## 2012-10-14 MED ORDER — DULOXETINE HCL 60 MG PO CPEP
60.0000 mg | ORAL_CAPSULE | Freq: Every day | ORAL | Status: DC
  Administered 2012-10-14 – 2012-10-17 (×4): 60 mg via ORAL
  Filled 2012-10-14 (×5): qty 1

## 2012-10-14 MED ORDER — SIMVASTATIN 20 MG PO TABS
20.0000 mg | ORAL_TABLET | Freq: Every day | ORAL | Status: DC
  Administered 2012-10-14 – 2012-10-17 (×4): 20 mg via ORAL
  Filled 2012-10-14 (×5): qty 1

## 2012-10-14 MED ORDER — TRAZODONE HCL 100 MG PO TABS: 100.0000 mg | ORAL_TABLET | Freq: Every evening | ORAL | Status: DC | PRN

## 2012-10-14 MED ORDER — PANTOPRAZOLE SODIUM 20 MG PO TBEC
20.0000 mg | DELAYED_RELEASE_TABLET | Freq: Two times a day (BID) | ORAL | Status: DC
  Filled 2012-10-14 (×2): qty 1

## 2012-10-14 NOTE — H&P (Signed)
Medical/psychiatric screening examination/treatment/procedure(s) were performed by non-physician practitioner and as supervising physician I was immediately available for consultation/collaboration.  I have seen and examined this patient and agree with the major elements of this evaluation.  

## 2012-10-14 NOTE — Progress Notes (Signed)
[facility] Group Notes:  (Counselor/Nursing/MHT/Case Management/Adjunct)  10/14/2012 12:47 AM  Type of Therapy:  Psychoeducational Skills  Participation Level:  Minimal  Participation Quality:  Attentive  Affect:  Depressed  Cognitive:  Appropriate  Insight:  Good  Engagement in Group:  Limited  Engagement in Therapy:  Limited  Modes of Intervention:  Education  Summary of Progress/Problems: The patient described her morning as being rough since she didn't want to come to the hospital. Her afternoon saw some improvement as she began to open up more with her peers. Her goal for tomorrow is to be "less sad".    Michelle Clements 10/14/2012, 12:47 AM

## 2012-10-14 NOTE — [facility] Suicide Risk Assessment (Signed)
Suicide Risk Assessment  Admission Assessment     Nursing information obtained from:  Patient Demographic factors:  Caucasian;Unemployed Current Mental Status:  NA Loss Factors:  NA Historical Factors:  Family history of mental illness or substance abuse Risk Reduction Factors:  Responsible for children under 44 years of age;Sense of responsibility to family;Living with another person, especially a relative;Positive social support;Positive therapeutic relationship  CLINICAL FACTORS:   Severe Anxiety and/or Agitation Depression:   Anhedonia Comorbid alcohol abuse/dependence Insomnia Alcohol/Substance Abuse/Dependencies Chronic Pain Previous Psychiatric Diagnoses and Treatments  COGNITIVE FEATURES THAT CONTRIBUTE TO RISK:  Thought constriction (tunnel vision)    SUICIDE RISK:   Moderate:  Frequent suicidal ideation with limited intensity, and duration, some specificity in terms of plans, no associated intent, good self-control, limited dysphoria/symptomatology, some risk factors present, and identifiable protective factors, including available and accessible social support.  PLAN OF CARE: Stop opiates and benzodiazepines and shift to more Neurtontin and use non narcotic analgesics and anxiolytics.  Optimally adjust meds for functioning with minimal pain as out patient.  Michelle Clements 10/14/2012, 11:31 AM

## 2012-10-14 NOTE — Progress Notes (Signed)
D: Pt states she has "felt better over the course of the day", although was tearful at HS, "overwhelmed".  Pt mood is depressed/sad/anxious with pensive affect.  Denies SI/HI, AVH, in no apparent acute physical distress.  Began menses tonight.  Pt requires frequent staff contact for reassurance, comes to med window often with benign requests.  Pleasant and polite but feels out of place on unit.  Visible on unit for most of shift, attended milieu groups.  Requested multiple PRN medications.  Provided positive support to roommate.  Contracting for safety.  Pt approached med window at approx 2330 stating that she felt "jumpy out of my skin".  COWS and CIWA completed;COWS=9, CIWA=11. Med on call contacted for further med orders since Pt was showing signs of moderate withdrawal but was already on a continuous dose of clonidine (Catapress patch). CBG @ 9147 = 122.    A: Pt given positive feedback for decision to detox; reassured that staff will support her while at [facility].  Encouraged to use peers for support as well and to utilize socialization opportunities for distraction.  Orders obtained for Trazodone 100mg  PO HS PRN sleep and Vistaril 50mg  x 1 anxiety.   PRNs given throughout shift are as follows:   2047  Zofran 4 mg PO PRN nausea, some effect 2237  Vistaril 25 mg PO PRN anxiety, little effect 0037  Vistaril 50 mg PO x 1 anxiety, good effect 0037  Trazodone 100 mg PO PRN sleep, good effect.  Medications administered according to med orders and POC.  Q15 minute safety checks maintained as per unit protocol.  R: Pt responds positively to staff reinforcement.  Good effect from 0037 med administrations.  Safety maintained. Dion Saucier RN

## 2012-10-14 NOTE — Progress Notes (Signed)
D) Pt has been up much of the day and interacting and attending the groups. States she is getting a lot out of the program and is still just a bit fearful. Has had a headache this afternoon and has rested in her bed. Pt talked about the fact that she had no idea that she was addicted to any of these medications and she feels bad about herself. A) Given support and reassurance along with praise for her progress with the detox. Encouraged to work on her paperwork. R) Pt attending the program and interact with her peers. Rates her depression at a 4 and her hopelessness at a 1. Denies SI and HI.

## 2012-10-14 NOTE — Progress Notes (Signed)
Psychoeducational Group Note  Date:  10/14/2012 Time:  2000  Group Topic/Focus:  Wrap-Up Group:   The focus of this group is to help patients review their daily goal of treatment and discuss progress on daily workbooks.  Participation Level:  Active  Participation Quality:  Appropriate  Affect:  Appropriate  Cognitive:  Appropriate  Insight:  Limited  Engagement in Group:  Limited  Additional Comments:    Michelle Clements A 10/14/2012, 11:04 PM

## 2012-10-14 NOTE — Progress Notes (Signed)
Goals Group Note  Date:  10/14/2012 Time: 0900  Group Topic/Focus:  The group focuses on helping the patients identify something they are thankful for, introducing the Sunday Patient Workbooks to them and helping them develop and begin to practice  Participation Level:  active Participation Quality: good Affect: flat Cognitive: good   Insight:  good  Engagement in Group: engaged  Additional Comments:    PDuke RN BC 1000

## 2012-10-14 NOTE — Clinical Social Work Note (Signed)
[facility] Group Notes:  (Clinical Social Work)  10/14/2012   3:00-4:00PM  Summary of Progress/Problems:   The main focus of today's process group was for the patient to identify their current support system and decide on other supports that can be put in place to prevent future hospitalizations.  An emphasis was placed on using therapist, doctor and problem-specific support groups to expand supports. The patient expressed that she isolates herself, and agreed that support groups may be one way she can use of broadening her support system, which now consists of her spouse.  Type of Therapy:  Group Therapy  Participation Level:  Active  Participation Quality:  Appropriate, Attentive and Sharing  Affect:  Anxious and Depressed  Cognitive:  Alert, Appropriate and Oriented  Insight:  Good  Engagement in Group:  Good  Engagement in Therapy:  Good  Modes of Intervention:  Clarification, Education, Limit-setting, Problem-solving, Socialization, Support and Processing   Ambrose Mantle, LCSW 10/14/2012, 5:04 PM

## 2012-10-14 NOTE — H&P (Signed)
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 Dan Humphreys whom she sees as an outpatient provider.  He feels that her use of these medications are inhibiting her treatment for her bipolar disorder, even though they are being taken as prescribed. Mood Symptoms:  Depression, minimal Depression Symptoms:  depressed mood, (Hypo) Manic Symptoms:  none Anxiety Symptoms:  minimal Psychotic Symptoms:  Hallucinations: Auditory  PTSD Symptoms:denies  Past Psychiatric History: Diagnosis:  Bipolar disorder  Hospitalizations:  none  Outpatient Care:  Dr. Dan Humphreys  Substance Abuse Care: none  Self-Mutilation:  none  Suicidal Attempts: denies  Violent Behaviors: denies   Past Medical History:   Past Medical History  Diagnosis Date  . Other malaise and fatigue   . Bipolar affective disorder, manic   . Hip pain, left   . Hypertension   . Neck pain   . Tobacco abuse   . Palpitations   . Allergic rhinitis   . Fibromyalgia   . Constipation   . IBS (irritable bowel syndrome)   . Hyperlipidemia   . GERD (gastroesophageal reflux disease)   . Depression   . Asthma   . PTSD (post-traumatic stress disorder)   . 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 Sleep apnea   . 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    Allergies:   Allergies  Allergen Reactions  . Sulfonamide Derivatives Anaphylaxis     : Angioedema   PTA Medications: Prescriptions prior to admission  Medication Sig Dispense Refill  . clonazePAM (KLONOPIN) 1 MG tablet Take 1 tablet (1 mg total) by mouth at bedtime.  30 tablet  1  . cyclobenzaprine (FLEXERIL) 10 MG tablet Take 10 mg by mouth every 6 (six) hours as needed.        . DULoxetine  (CYMBALTA) 60 MG capsule Take 1 capsule (60 mg total) by mouth daily.  62 capsule  0  . gabapentin (NEURONTIN) 600 MG tablet       . HYDROcodone-acetaminophen (LORTAB) 7.5-500 MG per tablet Take 1 tablet by mouth every 8 (eight) hours as needed.        Marland Kitchen ibuprofen (ADVIL,MOTRIN) 200 MG tablet Take 600 mg by mouth every 6 (six) hours as needed.        . lithium carbonate 300 MG capsule Take 1 capsule (300 mg total) by mouth at bedtime.  30 capsule  0  . metFORMIN (GLUCOPHAGE) 500 MG tablet       . montelukast (SINGULAIR) 10 MG tablet Take 10 mg by mouth daily.        Marland Kitchen omeprazole (PRILOSEC) 20 MG capsule Take 20 mg by mouth Daily.      . pravastatin (PRAVACHOL) 20 MG tablet Take 20 mg by mouth Daily.      . risperiDONE (RISPERDAL) 0.5 MG tablet Take 1 tablet (0.5 mg total) by mouth daily.  30 tablet  2  . albuterol (ACCUNEB) 0.63 MG/3ML nebulizer solution Take 1 ampule by nebulization every 6 (six) hours as needed.        Marland Kitchen albuterol (PROAIR HFA) 108 (90 BASE) MCG/ACT inhaler Inhale 2 puffs into the lungs every 6 (six) hours as needed.        . cetirizine (ZYRTEC) 10 MG tablet Take 10 mg by mouth daily.      Marland Kitchen  NON FORMULARY CPAP MASK, FACIAL CUSHION AND TUBING.       . polyethylene glycol powder (GLYCOLAX/MIRALAX) powder       . predniSONE (DELTASONE) 10 MG tablet         Previous Psychotropic Medications:  Medication/Dose   see list               Substance Abuse History in the last 12 months:  Denies All Substance Age of 1st Use Last Use Amount Specific Type  Nicotine      Alcohol      Cannabis      Opiates      Cocaine      Methamphetamines      LSD      Ecstasy      Benzodiazepines      Caffeine      Inhalants      Others:                         Consequences of Substance Abuse: Not applicable  Social History: Current Place of Residence:   Place of Birth:   Family Members: Marital Status:  Married Children:  Sons:  Daughters: Relationships: Education:  HS  Print production planner Problems/Performance: Religious Beliefs/Practices: History of Abuse (Emotional/Phsycial/Sexual) Occupational Experiences; Military History:  None. Legal History: Hobbies/Interests:  Family History:   Family History  Problem Relation Age of Onset  . Fibromyalgia Mother   . Diabetes type II Mother   . Depression Mother   . Alzheimer's disease Mother   . GER disease Mother   . Heart disease Mother   . Heart attack Father 31    MI x5  . Stroke Father     CVA x7  . Asthma Father   . Brain cancer Father    ROS  BP 145/88  Pulse 75  Temp 97.8 F (36.6 C) (Oral)  Resp 16  Ht 5' 11.25" (1.81 m)  Wt 117.482 kg (259 lb)  BMI 35.87 kg/m2  LMP 09/23/2012  General Appearance:  Alert, cooperative, no distress, appears stated age  Head:  Normocephalic, without obvious abnormality, atraumatic  Eyes:  PERRL, conjunctiva/corneas clear, EOM's intact, fundi benign, both eyes  Ears:  Normal TM's and external ear canals, both ears  Nose: Nares normal, septum midline,mucosa normal, no drainage or sinus tenderness  Throat: Lips, mucosa, and tongue normal; teeth and gums normal  Neck: Supple, symmetrical, trachea midline, no adenopathy;  thyroid: not enlarged, symmetric, no tenderness/mass/nodules; no carotid bruit or JVD  Back:   Symmetric, no curvature, ROM normal, no CVA tenderness  Lungs:   Clear to auscultation bilaterally, respirations unlabored  Breasts:  deferred  Heart:  Regular rate and rhythm, S1 and S2 normal, no murmur, rub, or gallop  Abdomen:   Soft, non-tender, bowel sounds active all four quadrants,  no masses, no organomegaly  Pelvic: Deferred  Extremities: Extremities normal, atraumatic, no cyanosis or edema  Pulses: 2+ and symmetric  Skin: Skin color, texture, turgor normal, no rashes or lesions  Lymph nodes: Cervical, supraclavicular, and axillary nodes normal  Neurologic: Normal    Mental Status Examination/Evaluation: Objective:  Appearance:  Casual  Eye Contact::  Good  Speech:  Clear and Coherent  Volume:  Normal  Mood:  Euthymic  Affect:  Congruent  Thought Process:  Goal Directed  Orientation:  Full  Thought Content:  WDL  Suicidal Thoughts:  No  Homicidal Thoughts:  No  Memory:  Immediate;   Fair Recent;  Fair Remote;   Fair  Judgement:  Good  Insight:  Present  Psychomotor Activity:  Normal  Concentration:  Good  Recall:  Good  Akathisia:  No  Handed:  Right  AIMS (if indicated):     Assets:  Communication Skills Desire for Improvement Financial Resources/Insurance Housing Social Support  Sleep:  Number of Hours: 3.5     Laboratory/X-Ray Psychological Evaluation(s)      Assessment:    AXIS I:   Bipolar disorder AXIS II:  Deferred AXIS III:   Past Medical History  Diagnosis Date  . Other malaise and fatigue   . Bipolar affective disorder, manic   . Hip pain, left   . Hypertension   . Neck pain   . Tobacco abuse   . Palpitations   . Allergic rhinitis   . Fibromyalgia   . Constipation   . IBS (irritable bowel syndrome)   . Hyperlipidemia   . GERD (gastroesophageal reflux disease)   . Depression   . Asthma   . PTSD (post-traumatic stress disorder)   . 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 Sleep apnea   . 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  AXIS IV:  occupational problems AXIS V:  41-50 serious symptoms  Treatment Plan/Recommendations: 1. Admit for crisis management,stabilization and detox with standard (Librium/Clonidine) protocol. 2. Medication management to reduce current symptoms to base line and improve the patient's overall level of functioning 3. Treat health problems as indicated. 4. Develop treatment plan to decrease risk of relapse upon discharge and the need for     readmission. 5. Psycho-social education regarding relapse prevention and self care. 6. Health care follow up as needed for medical problems. 7. Restart home medications where  appropriate.   Treatment Plan Summary: Daily contact with patient to assess and evaluate symptoms and progress in treatment Medication management   Current Medications:  Current Facility-Administered Medications  Medication Dose Route Frequency Provider Last Rate Last Dose  . acetaminophen (TYLENOL) tablet 650 mg  650 mg Oral Q6H PRN Shuvon Rankin, NP      . alum & mag hydroxide-simeth (MAALOX/MYLANTA) 200-200-20 MG/5ML suspension 30 mL  30 mL Oral Q4H PRN Shuvon Rankin, NP      . amitriptyline (ELAVIL) tablet 25 mg  25 mg Oral QHS Mike Craze, MD   25 mg at 10/13/12 2130  . cloNIDine (CATAPRES - Dosed in mg/24 hr) patch 0.2 mg  0.2 mg Transdermal Weekly Mike Craze, MD   0.2 mg at 10/13/12 1513  . dicyclomine (BENTYL) tablet 20 mg  20 mg Oral Q6H PRN Mike Craze, MD      . DULoxetine (CYMBALTA) DR capsule 30 mg  30 mg Oral BID Shuvon Rankin, NP   30 mg at 10/14/12 0820  . [COMPLETED] gabapentin (NEURONTIN) capsule 200 mg  200 mg Oral BID Mike Craze, MD   200 mg at 10/13/12 2130  . gabapentin (NEURONTIN) tablet 600 mg  600 mg Oral QID Mike Craze, MD      . hydrOXYzine (ATARAX/VISTARIL) tablet 25 mg  25 mg Oral Q6H PRN Mike Craze, MD   25 mg at 10/13/12 2237  . [COMPLETED] hydrOXYzine (ATARAX/VISTARIL) tablet 50 mg  50 mg Oral Once Nanine Means, NP   50 mg at 10/14/12 0037  . lidocaine (LIDODERM) 5 % 1 patch  1 patch Transdermal QAC breakfast Mike Craze, MD   1 patch at 10/14/12 [id number]  . loperamide (IMODIUM) capsule 2-4 mg  2-4 mg Oral PRN Mike Craze, MD      . magnesium hydroxide (MILK OF MAGNESIA) suspension 30 mL  30 mL Oral Daily PRN Shuvon Rankin, NP      . meloxicam (MOBIC) tablet 7.5 mg  7.5 mg Oral BID Mike Craze, MD      . methocarbamol (ROBAXIN) tablet 500 mg  500 mg Oral Q8H PRN Mike Craze, MD   500 mg at 10/14/12 4540  . naproxen (NAPROSYN) tablet 500 mg  500 mg Oral BID PRN Mike Craze, MD   500 mg at 10/13/12 1516  . ondansetron  (ZOFRAN-ODT) disintegrating tablet 4 mg  4 mg Oral Q6H PRN Mike Craze, MD   4 mg at 10/14/12 0943  . pantoprazole (PROTONIX) EC tablet 20 mg  20 mg Oral BID AC Mike Craze, MD      . pneumococcal 23 valent vaccine (PNU-IMMUNE) injection 0.5 mL  0.5 mL Intramuscular Tomorrow-1000 Mike Craze, MD      . traZODone (DESYREL) tablet 100 mg  100 mg Oral QHS PRN,MR X 1 Mike Craze, MD      . [DISCONTINUED] celecoxib (CELEBREX) capsule 100 mg  100 mg Oral BID Mike Craze, MD   100 mg at 10/14/12 0820  . [DISCONTINUED] traZODone (DESYREL) tablet 100 mg  100 mg Oral QHS PRN Nanine Means, NP   100 mg at 10/14/12 0037    Observation Level/Precautions:  routine  Laboratory:    Psychotherapy:    Medications:    Routine PRN Medications:  Yes  Consultations:    Discharge Concerns:    Other:      Lloyd Huger T. Jonaya Freshour PAC 11/10/201312:44 PM

## 2012-10-14 NOTE — Progress Notes (Signed)
Psychoeducational Group Note  Date: 10/14/2012 Time: 1015  Group Topic/Focus:  Making Healthy Choices:   The focus of this group is to help patients identify negative/unhealthy choices they were using prior to admission and identify positive/healthier coping strategies to replace them upon discharge.  Participation Level:  Active  Participation Quality:  Appropriate  Affect:  Appropriate  Cognitive:  Alert  Insight:  Good  Engagement in Group:  Good  Additional Comments:    10/14/2012,4:52 PM Abbygail [place], Joie Bimler

## 2012-10-15 DIAGNOSIS — F1120 Opioid dependence, uncomplicated: Secondary | ICD-10-CM

## 2012-10-15 LAB — URINE DRUGS OF ABUSE SCREEN W ALC, ROUTINE (REF LAB)
Barbiturate Quant, Ur: NEGATIVE
Benzodiazepines.: NEGATIVE
Creatinine,U: 141.2 mg/dL
Marijuana Metabolite: NEGATIVE
Methadone: NEGATIVE
Phencyclidine (PCP): NEGATIVE

## 2012-10-15 LAB — GLUCOSE, CAPILLARY

## 2012-10-15 LAB — VITAMIN D 25 HYDROXY (VIT D DEFICIENCY, FRACTURES): Vit D, 25-Hydroxy: 39 ng/mL (ref 30–89)

## 2012-10-15 NOTE — Progress Notes (Signed)
Psychoeducational Group Note  Date:  10/15/2012 Time:  1100  Group Topic/Focus:  Wellness Toolbox:   The focus of this group is to discuss various aspects of wellness, balancing those aspects and exploring ways to increase the ability to experience wellness.  Patients will create a wellness toolbox for use upon discharge.  Participation Level:  Active  Participation Quality:  Appropriate and Attentive  Affect:  Appropriate  Cognitive:  Alert and Appropriate  Insight:  Good  Engagement in Group:  Good  Additional Comments:  Pt. Listened attentively, shared and was engaged in group.  Ruta Hinds [facility] 10/15/2012, 6:26 PM

## 2012-10-15 NOTE — Progress Notes (Signed)
[facility] MD Progress Note  10/15/2012 12:24 PM Michelle Clements  MRN:  [id number]  S: Patient seen this morning. Reports tolerating her taper regimen and detox from opiates well. Tolerating the lithium and Risperdal without any side effects.  Diagnosis:   Axis I: Opiate dependence, Bipolar disorder Axis II: No diagnosis Axis III:  Past Medical History  Diagnosis Date  . Other malaise and fatigue   . Bipolar affective disorder, manic   . Hip pain, left   . Hypertension   . Neck pain   . Tobacco abuse   . Palpitations   . Allergic rhinitis   . Fibromyalgia   . Constipation   . IBS (irritable bowel syndrome)   . Hyperlipidemia   . GERD (gastroesophageal reflux disease)   . Depression   . Asthma   . PTSD (post-traumatic stress disorder)   . 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 Sleep apnea   . 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   Axis IV: other psychosocial or environmental problems Axis V: 51-60 moderate symptoms  ADL's:  Intact  Sleep: Fair  Appetite:  Fair  Mental Status Examination/Evaluation: Objective:  Appearance: Casual  Eye Contact::  Good  Speech:  Clear and Coherent  Volume:  Normal  Mood:  Anxious  Affect:  Appropriate  Thought Process:  Coherent  Orientation:  Full  Thought Content:  WDL  Suicidal Thoughts:  No  Homicidal Thoughts:  No  Memory:  Immediate;   Fair Recent;   Fair Remote;   Fair  Judgement:  Fair  Insight:  Fair  Psychomotor Activity:  Normal  Concentration:  Fair  Recall:  Fair  Akathisia:  No  Handed:  Right  AIMS (if indicated):     Assets:  Communication Skills Desire for Improvement  Sleep:  Number of Hours: 5.25    Vital Signs:Blood pressure 134/79, pulse 74, temperature 98.1 F (36.7 C), temperature source Oral, resp. rate 18, height 5' 11.25" (1.81 m), weight 117.482 kg (259 lb), last menstrual period 09/23/2012. Current Medications: Current Facility-Administered Medications  Medication Dose  Route Frequency Provider Last Rate Last Dose  . acetaminophen (TYLENOL) tablet 650 mg  650 mg Oral Q6H PRN Shuvon Rankin, NP      . albuterol (PROVENTIL HFA;VENTOLIN HFA) 108 (90 BASE) MCG/ACT inhaler 2 puff  2 puff Inhalation Q6H Verne Spurr, PA-C   2 puff at 10/15/12 0810  . alum & mag hydroxide-simeth (MAALOX/MYLANTA) 200-200-20 MG/5ML suspension 30 mL  30 mL Oral Q4H PRN Shuvon Rankin, NP      . amitriptyline (ELAVIL) tablet 25 mg  25 mg Oral QHS Mike Craze, MD   25 mg at 10/14/12 2207  . cloNIDine (CATAPRES - Dosed in mg/24 hr) patch 0.2 mg  0.2 mg Transdermal Weekly Mike Craze, MD   0.2 mg at 10/13/12 1513  . dicyclomine (BENTYL) tablet 20 mg  20 mg Oral Q6H PRN Mike Craze, MD      . DULoxetine (CYMBALTA) DR capsule 60 mg  60 mg Oral Daily Verne Spurr, PA-C   60 mg at 10/15/12 0811  . gabapentin (NEURONTIN) tablet 600 mg  600 mg Oral QID Mike Craze, MD   600 mg at 10/15/12 1207  . hydrOXYzine (ATARAX/VISTARIL) tablet 25 mg  25 mg Oral Q6H PRN Mike Craze, MD   25 mg at 10/13/12 2237  . lidocaine (LIDODERM) 5 % 1 patch  1 patch Transdermal QAC breakfast Mike Craze, MD  1 patch at 10/15/12 0812  . lithium carbonate capsule 300 mg  300 mg Oral QHS Verne Spurr, PA-C   300 mg at 10/14/12 2208  . loperamide (IMODIUM) capsule 2-4 mg  2-4 mg Oral PRN Mike Craze, MD      . loratadine (CLARITIN) tablet 10 mg  10 mg Oral Daily Verne Spurr, PA-C   10 mg at 10/15/12 0813  . magnesium hydroxide (MILK OF MAGNESIA) suspension 30 mL  30 mL Oral Daily PRN Shuvon Rankin, NP      . meloxicam (MOBIC) tablet 7.5 mg  7.5 mg Oral BID Mike Craze, MD   7.5 mg at 10/15/12 0814  . metFORMIN (GLUCOPHAGE) tablet 500 mg  500 mg Oral BID WC Verne Spurr, PA-C   500 mg at 10/15/12 0814  . methocarbamol (ROBAXIN) tablet 500 mg  500 mg Oral Q8H PRN Mike Craze, MD   500 mg at 10/14/12 1478  . naproxen (NAPROSYN) tablet 500 mg  500 mg Oral BID PRN Mike Craze, MD   500 mg at  10/14/12 1609  . ondansetron (ZOFRAN-ODT) disintegrating tablet 4 mg  4 mg Oral Q6H PRN Mike Craze, MD   4 mg at 10/14/12 0943  . pantoprazole (PROTONIX) EC tablet 40 mg  40 mg Oral Daily Verne Spurr, PA-C   40 mg at 10/15/12 0814  . [COMPLETED] pneumococcal 23 valent vaccine (PNU-IMMUNE) injection 0.5 mL  0.5 mL Intramuscular Tomorrow-1000 Mike Craze, MD   0.5 mL at 10/14/12 1332  . risperiDONE (RISPERDAL) tablet 0.5 mg  0.5 mg Oral Daily Verne Spurr, PA-C      . simvastatin (ZOCOR) tablet 20 mg  20 mg Oral q1800 Verne Spurr, PA-C   20 mg at 10/14/12 1849  . traZODone (DESYREL) tablet 100 mg  100 mg Oral QHS PRN,MR X 1 Mike Craze, MD      . [DISCONTINUED] DULoxetine (CYMBALTA) DR capsule 30 mg  30 mg Oral BID Shuvon Rankin, NP   30 mg at 10/14/12 0820  . [DISCONTINUED] pantoprazole (PROTONIX) EC tablet 20 mg  20 mg Oral BID AC Mike Craze, MD        Lab Results:  Results for orders placed during the hospital encounter of 10/13/12 (from the past 48 hour(s))  URINALYSIS, ROUTINE W REFLEX MICROSCOPIC     Status: Abnormal   Collection Time   10/13/12  6:21 PM      Component Value Range Comment   Color, Urine YELLOW  YELLOW    APPearance CLEAR  CLEAR    Specific Gravity, Urine 1.018  1.005 - 1.030    pH 6.5  5.0 - 8.0    Glucose, UA NEGATIVE  NEGATIVE mg/dL    Hgb urine dipstick MODERATE (*) NEGATIVE    Bilirubin Urine NEGATIVE  NEGATIVE    Ketones, ur NEGATIVE  NEGATIVE mg/dL    Protein, ur NEGATIVE  NEGATIVE mg/dL    Urobilinogen, UA 1.0  0.0 - 1.0 mg/dL    Nitrite NEGATIVE  NEGATIVE    Leukocytes, UA TRACE (*) NEGATIVE   URINE RAPID DRUG SCREEN (HOSP PERFORMED)     Status: Abnormal   Collection Time   10/13/12  6:21 PM      Component Value Range Comment   Opiates POSITIVE (*) NONE DETECTED    Cocaine NONE DETECTED  NONE DETECTED    Benzodiazepines NONE DETECTED  NONE DETECTED    Amphetamines NONE DETECTED  NONE DETECTED    Tetrahydrocannabinol  NONE DETECTED   NONE DETECTED    Barbiturates NONE DETECTED  NONE DETECTED   URINE MICROSCOPIC-ADD ON     Status: Normal   Collection Time   10/13/12  6:21 PM      Component Value Range Comment   Squamous Epithelial / LPF RARE  RARE    WBC, UA 0-2  <3 WBC/hpf   COMPREHENSIVE METABOLIC PANEL     Status: Abnormal   Collection Time   10/13/12  7:28 PM      Component Value Range Comment   Sodium 140  135 - 145 mEq/L    Potassium 4.0  3.5 - 5.1 mEq/L    Chloride 101  96 - 112 mEq/L    CO2 28  19 - 32 mEq/L    Glucose, Bld 152 (*) 70 - 99 mg/dL    BUN 10  6 - 23 mg/dL    Creatinine, Ser 1.61  0.50 - 1.10 mg/dL    Calcium 09.6  8.4 - 10.5 mg/dL    Total Protein 6.9  6.0 - 8.3 g/dL    Albumin 4.4  3.5 - 5.2 g/dL    AST 36  0 - 37 U/L    ALT 38 (*) 0 - 35 U/L    Alkaline Phosphatase 66  39 - 117 U/L    Total Bilirubin 0.3  0.3 - 1.2 mg/dL    GFR calc non Af Amer 74 (*) >90 mL/min    GFR calc Af Amer 85 (*) >90 mL/min   CBC WITH DIFFERENTIAL     Status: Abnormal   Collection Time   10/13/12  7:28 PM      Component Value Range Comment   WBC 11.0 (*) 4.0 - 10.5 K/uL    RBC 4.32  3.87 - 5.11 MIL/uL    Hemoglobin 11.5 (*) 12.0 - 15.0 g/dL    HCT 04.5 (*) 40.9 - 46.0 %    MCV 80.8  78.0 - 100.0 fL    MCH 26.6  26.0 - 34.0 pg    MCHC 33.0  30.0 - 36.0 g/dL    RDW 81.1  91.4 - 78.2 %    Platelets 386  150 - 400 K/uL    Neutrophils Relative 64  43 - 77 %    Neutro Abs 7.0  1.7 - 7.7 K/uL    Lymphocytes Relative 27  12 - 46 %    Lymphs Abs 2.9  0.7 - 4.0 K/uL    Monocytes Relative 5  3 - 12 %    Monocytes Absolute 0.6  0.1 - 1.0 K/uL    Eosinophils Relative 4  0 - 5 %    Eosinophils Absolute 0.4  0.0 - 0.7 K/uL    Basophils Relative 1  0 - 1 %    Basophils Absolute 0.1  0.0 - 0.1 K/uL   GLUCOSE, CAPILLARY     Status: Abnormal   Collection Time   10/13/12  8:59 PM      Component Value Range Comment   Glucose-Capillary 122 (*) 70 - 99 mg/dL   GLUCOSE, CAPILLARY     Status: Abnormal   Collection Time     10/14/12  6:33 AM      Component Value Range Comment   Glucose-Capillary 135 (*) 70 - 99 mg/dL   COMPREHENSIVE METABOLIC PANEL     Status: Abnormal   Collection Time   10/14/12  6:45 AM      Component Value Range Comment   Sodium  136  135 - 145 mEq/L    Potassium 4.1  3.5 - 5.1 mEq/L    Chloride 100  96 - 112 mEq/L    CO2 28  19 - 32 mEq/L    Glucose, Bld 132 (*) 70 - 99 mg/dL    BUN 12  6 - 23 mg/dL    Creatinine, Ser 8.11  0.50 - 1.10 mg/dL    Calcium 9.8  8.4 - 91.4 mg/dL    Total Protein 6.8  6.0 - 8.3 g/dL    Albumin 4.2  3.5 - 5.2 g/dL    AST 37  0 - 37 U/L    ALT 39 (*) 0 - 35 U/L    Alkaline Phosphatase 65  39 - 117 U/L    Total Bilirubin 0.5  0.3 - 1.2 mg/dL    GFR calc non Af Amer 76 (*) >90 mL/min    GFR calc Af Amer 88 (*) >90 mL/min   CBC     Status: Abnormal   Collection Time   10/14/12  6:45 AM      Component Value Range Comment   WBC 8.9  4.0 - 10.5 K/uL    RBC 4.25  3.87 - 5.11 MIL/uL    Hemoglobin 11.4 (*) 12.0 - 15.0 g/dL    HCT 78.2 (*) 95.6 - 46.0 %    MCV 80.9  78.0 - 100.0 fL    MCH 26.8  26.0 - 34.0 pg    MCHC 33.1  30.0 - 36.0 g/dL    RDW 21.3  08.6 - 57.8 %    Platelets 334  150 - 400 K/uL   TSH     Status: Normal   Collection Time   10/14/12  6:45 AM      Component Value Range Comment   TSH 3.261  0.350 - 4.500 uIU/mL   BILIRUBIN, DIRECT     Status: Normal   Collection Time   10/14/12  6:45 AM      Component Value Range Comment   Bilirubin, Direct <0.1  0.0 - 0.3 mg/dL   GLUCOSE, CAPILLARY     Status: Abnormal   Collection Time   10/14/12 11:48 AM      Component Value Range Comment   Glucose-Capillary 115 (*) 70 - 99 mg/dL    Comment 1 Notify RN      Comment 2 Documented in Chart     GLUCOSE, CAPILLARY     Status: Abnormal   Collection Time   10/14/12  8:51 PM      Component Value Range Comment   Glucose-Capillary 149 (*) 70 - 99 mg/dL   GLUCOSE, CAPILLARY     Status: Abnormal   Collection Time   10/15/12  5:42 AM       Component Value Range Comment   Glucose-Capillary 132 (*) 70 - 99 mg/dL   GLUCOSE, CAPILLARY     Status: Normal   Collection Time   10/15/12 12:00 PM      Component Value Range Comment   Glucose-Capillary 81  70 - 99 mg/dL     Physical Findings: AIMS: Facial and Oral Movements Muscles of Facial Expression: None, normal Lips and Perioral Area: None, normal Jaw: None, normal Tongue: None, normal,Extremity Movements Upper (arms, wrists, hands, fingers): None, normal Lower (legs, knees, ankles, toes): None, normal, Trunk Movements Neck, shoulders, hips: None, normal, Overall Severity Severity of abnormal movements (highest score from questions above): None, normal Incapacitation due to abnormal movements: None, normal Patient's awareness  of abnormal movements (rate only patient's report): No Awareness, Dental Status Current problems with teeth and/or dentures?: No Does patient usually wear dentures?: No  CIWA:  CIWA-Ar Total: 0  COWS:  COWS Total Score: 3   Treatment Plan Summary: Daily contact with patient to assess and evaluate symptoms and progress in treatment Medication management  Plan: Continue current regimen. Continue to monitor.  Anya Murphey 10/15/2012, 12:24 PM

## 2012-10-15 NOTE — Progress Notes (Addendum)
D:  On patient's self inventory sheet, patient has fair sleep, good appetite, low to normal appetite, good attention span.  Denied depression.  Rated hopelessness #4.  Has experienced tremors, chilling, muscle humps in past 24 hours.  Denied SI.  Has experienced lightheadedness, pain, dizziness, headaches in past 24 hours.  Zero pain goal.  Worst pain #5.  After discharge, plans to get out more, pray harder.  No questions for staff.  No problems staying on meds after discharge. A:  Medications given per MD order.  Support and encouragement given throughout day.  Support and safety checks completed as ordered. R:  Following treatment plan.  Denied SI and HI.  Denied A/V hallucinations.  Contracts for safety.  Patient remains safe and receptive on unit. Patient stated she has muscle twitches bilateral arms/legs averaging 5 times per hour.
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
## 2012-10-15 NOTE — Social Work (Signed)
[facility] Group Notes:  (Counselor/Nursing/MHT/Case Management/Adjunct)       Discharge Planning Group 8:30-9:30 AM    10/15/2012 12:43 PM   Type of Therapy: Group  Participation Level:  Active  Participation Quality:  Attentive  Affect:  Appropriate  Cognitive:  Alert and Appropriate  Insight:  Good  Engagement in Group:  Good  Engagement in Therapy:  Good  Modes of Intervention:  Clarification, Problem-solving, Support and Exploration  Summary of Progress/Problems: Michelle Clements attended discharge planning group.  She denies SI/HI and advised of admitted to hospital to discharge from benzos and opiods.  She has home transportation and outpatient follow up.Wynn Banker 10/15/2012 12:43 PM

## 2012-10-15 NOTE — Progress Notes (Signed)
D: Pt visible on unit, attended groups; cooperative and pleasant to staff and peers.  Enjoyed interacting with peers and being able to offer and receive support.  Pt complained of anxiety, decreased concentration, and restlessness.  However, eye contact was consistently good with animated affect; speech logical and coherent and reflective of normal thought content and perception.  Mood intermittently depressed when Pt would be distracted by thoughts of her own situation, appropriate to circumstances.  Denies SI/HI, AVH, new-onset pain; contracting for safety.  No evidence of withdrawal symptoms observed or reported.  CBG at 2051 was 149.  A: Provided support and encouragement for Pt's increase in socialization and interaction.  Administered medications according to med orders and POC.  Q15 minute safety checks maintained as per unit protocol.  R: Positive and productive shift for Pt. No requests for PRN medications.  Safety maintained. Dion Saucier RN

## 2012-10-15 NOTE — Progress Notes (Signed)
Psychoeducational Group Note  Date:  10/15/2012 Time:  1:15  Group Topic/Focus:  Overcoming Obstacles to Wellness  Participation Level:  Active  Participation Quality:  Appropriate  Affect:  Appropriate  Cognitive:  Appropriate  Insight:  Good  Engagement in Group:  Good  Additional Comments:  Kalii actively participated in group by answering questions and being supportive of other members (e.g., comforting another member by putting her hand on her arm). Esmeralda shared a visualization that has helped her: her husband blowing leaves and smiling inside at her. Matsuko's goal was to get out more, for example go with her husband to do errands or to change out of pajamas before picking her daughter up from school.  Antonis Lor S 10/15/2012, 2:53 PM

## 2012-10-16 LAB — OPIATE, QUANTITATIVE, URINE
6 Monoacetylmorphine, Ur-Confirm: NEGATIVE ng/mL
Hydrocodone: 126 ng/mL
Noroxycodone, Ur: NEGATIVE ng/mL

## 2012-10-16 LAB — GLUCOSE, CAPILLARY
Glucose-Capillary: 118 mg/dL — ABNORMAL HIGH (ref 70–99)
Glucose-Capillary: 105 mg/dL — ABNORMAL HIGH (ref 70–99)
Glucose-Capillary: 110 mg/dL — ABNORMAL HIGH (ref 70–99)
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
## 2012-10-16 NOTE — Progress Notes (Signed)
[facility] Family/Significant Other Suicide Prevention Education  Suicide Prevention Education:  Education Completed; with Michelle Clements ,patients husband at [phone],  (name of family member/significant other) has been identified by the patient as the family member/significant other with whom the patient will be residing, and identified as the person(s) who will aid the patient in the event of a mental health crisis (suicidal ideations/suicide attempt).  With written consent from the patient, the family member/significant other has been provided the following suicide prevention education, prior to the and/or following the discharge of the patient.  The suicide prevention education provided includes the following:  Suicide risk factors  Suicide prevention and interventions  National Suicide Hotline telephone number  [facility] assessment telephone number  [facility] Emergency Assistance 911  [facility] and/or Residential Mobile Crisis Unit telephone number  Request made of family/significant other to:  Remove weapons (e.g., guns, rifles, knives), all items previously/currently identified as safety concern.    Remove drugs/medications (over-the-counter, prescriptions, illicit drugs), all items previously/currently identified as a safety concern.  The family member/significant other verbalizes understanding of the suicide prevention education information provided.  The family member/significant other agrees to remove the items of safety concern listed above.  Michelle Clements 10/16/2012, 4:14 PM

## 2012-10-16 NOTE — Progress Notes (Signed)
Psychoeducational Group Note  Date:  10/16/2012 Time:  1100  Group Topic/Focus:  Recovery Goals:   The focus of this group is to identify appropriate goals for recovery and establish a plan to achieve them.  Participation Level:  Active  Participation Quality:  Appropriate, Attentive and Sharing  Affect:  Appropriate  Cognitive:  Appropriate  Insight:  Good  Engagement in Group:  Good  Additional Comments:  Patient attended recovery goals and shared. Patient stated a definition of recovery in own terms. Then stated a situation or a time when in crisis and changes that patient would to make a positive. Patient set two goals on things that would like to see change in and how it be changed in the stage of recovery.   Karleen Hampshire Brittini 10/16/2012, 2:34 PM

## 2012-10-16 NOTE — Progress Notes (Signed)
Psychoeducational Group Note  Date:  10/16/2012 Time:  2000  Group Topic/Focus:  Wrap-Up Group:   The focus of this group is to help patients review their daily goal of treatment and discuss progress on daily workbooks.  Participation Level:  Active  Participation Quality:  Appropriate  Affect:  Appropriate  Cognitive:  Appropriate  Insight:  Good  Engagement in Group:  Good  Additional CommentsCasilda Carls 10/16/2012, 9:39 PM

## 2012-10-16 NOTE — Progress Notes (Signed)
[facility] MD Progress Note  10/16/2012 11:27 AM Michelle Clements  MRN:  [id number]  S: Patient seen this morning. She reports tolerating the taper regimen well. Mood is good.  Diagnosis:   Axis I: Bipolar, mixed Axis II: No diagnosis Axis III:  Past Medical History  Diagnosis Date  . Other malaise and fatigue   . Bipolar affective disorder, manic   . Hip pain, left   . Hypertension   . Neck pain   . Tobacco abuse   . Palpitations   . Allergic rhinitis   . Fibromyalgia   . Constipation   . IBS (irritable bowel syndrome)   . Hyperlipidemia   . GERD (gastroesophageal reflux disease)   . Depression   . Asthma   . PTSD (post-traumatic stress disorder)   . 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 Sleep apnea   . 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   Axis IV: occupational problems Axis V: 51-60 moderate symptoms  ADL's:  Intact  Sleep: Fair  Appetite:  Fair  Mental Status Examination/Evaluation: Objective:  Appearance: Casual  Eye Contact::  Good  Speech:  Clear and Coherent  Volume:  Normal  Mood:  Anxious  Affect:  Appropriate  Thought Process:  Coherent  Orientation:  Full  Thought Content:  WDL  Suicidal Thoughts:  No  Homicidal Thoughts:  No  Memory:  Immediate;   Fair Recent;   Fair Remote;   Fair  Judgement:  Fair  Insight:  Fair  Psychomotor Activity:  Normal  Concentration:  Fair  Recall:  Fair  Akathisia:  No  Handed:  Right  AIMS (if indicated):     Assets:  Communication Skills Desire for Improvement Housing Social Support  Sleep:  Number of Hours: 6.75    Vital Signs:Blood pressure 125/80, pulse 74, temperature 97.9 F (36.6 C), temperature source Oral, resp. rate 18, height 5' 11.25" (1.81 m), weight 117.482 kg (259 lb), last menstrual period 09/23/2012. Current Medications: Current Facility-Administered Medications  Medication Dose Route Frequency Provider Last Rate Last Dose  . acetaminophen (TYLENOL) tablet 650 mg  650  mg Oral Q6H PRN Shuvon Rankin, NP      . albuterol (PROVENTIL HFA;VENTOLIN HFA) 108 (90 BASE) MCG/ACT inhaler 2 puff  2 puff Inhalation Q6H Verne Spurr, PA-C   2 puff at 10/16/12 0745  . alum & mag hydroxide-simeth (MAALOX/MYLANTA) 200-200-20 MG/5ML suspension 30 mL  30 mL Oral Q4H PRN Shuvon Rankin, NP      . amitriptyline (ELAVIL) tablet 25 mg  25 mg Oral QHS Mike Craze, MD   25 mg at 10/15/12 2101  . cloNIDine (CATAPRES - Dosed in mg/24 hr) patch 0.2 mg  0.2 mg Transdermal Weekly Mike Craze, MD   0.2 mg at 10/13/12 1513  . dicyclomine (BENTYL) tablet 20 mg  20 mg Oral Q6H PRN Mike Craze, MD      . DULoxetine (CYMBALTA) DR capsule 60 mg  60 mg Oral Daily Verne Spurr, PA-C   60 mg at 10/16/12 0746  . gabapentin (NEURONTIN) tablet 600 mg  600 mg Oral QID Mike Craze, MD   600 mg at 10/16/12 0746  . hydrOXYzine (ATARAX/VISTARIL) tablet 25 mg  25 mg Oral Q6H PRN Mike Craze, MD   25 mg at 10/13/12 2237  . lidocaine (LIDODERM) 5 % 1 patch  1 patch Transdermal QAC breakfast Mike Craze, MD   1 patch at 10/16/12 0747  . lithium carbonate capsule 300  mg  300 mg Oral QHS Verne Spurr, PA-C   300 mg at 10/15/12 2101  . loperamide (IMODIUM) capsule 2-4 mg  2-4 mg Oral PRN Mike Craze, MD      . loratadine (CLARITIN) tablet 10 mg  10 mg Oral Daily Verne Spurr, PA-C   10 mg at 10/15/12 0813  . magnesium hydroxide (MILK OF MAGNESIA) suspension 30 mL  30 mL Oral Daily PRN Shuvon Rankin, NP   30 mL at 10/16/12 0906  . meloxicam (MOBIC) tablet 7.5 mg  7.5 mg Oral BID Mike Craze, MD   7.5 mg at 10/16/12 0748  . metFORMIN (GLUCOPHAGE) tablet 500 mg  500 mg Oral BID WC Verne Spurr, PA-C   500 mg at 10/16/12 0748  . methocarbamol (ROBAXIN) tablet 500 mg  500 mg Oral Q8H PRN Mike Craze, MD   500 mg at 10/14/12 1610  . naproxen (NAPROSYN) tablet 500 mg  500 mg Oral BID PRN Mike Craze, MD   500 mg at 10/15/12 1603  . ondansetron (ZOFRAN-ODT) disintegrating tablet 4 mg  4 mg  Oral Q6H PRN Mike Craze, MD   4 mg at 10/14/12 0943  . pantoprazole (PROTONIX) EC tablet 40 mg  40 mg Oral Daily Verne Spurr, PA-C   40 mg at 10/16/12 0749  . risperiDONE (RISPERDAL) tablet 0.5 mg  0.5 mg Oral Daily Verne Spurr, PA-C   0.5 mg at 10/15/12 2059  . simvastatin (ZOCOR) tablet 20 mg  20 mg Oral q1800 Verne Spurr, PA-C   20 mg at 10/15/12 1725  . traZODone (DESYREL) tablet 100 mg  100 mg Oral QHS PRN,MR X 1 Mike Craze, MD        Lab Results:  Results for orders placed during the hospital encounter of 10/13/12 (from the past 48 hour(s))  GLUCOSE, CAPILLARY     Status: Abnormal   Collection Time   10/14/12 11:48 AM      Component Value Range Comment   Glucose-Capillary 115 (*) 70 - 99 mg/dL    Comment 1 Notify RN      Comment 2 Documented in Chart     GLUCOSE, CAPILLARY     Status: Abnormal   Collection Time   10/14/12  8:51 PM      Component Value Range Comment   Glucose-Capillary 149 (*) 70 - 99 mg/dL   GLUCOSE, CAPILLARY     Status: Abnormal   Collection Time   10/15/12  5:42 AM      Component Value Range Comment   Glucose-Capillary 132 (*) 70 - 99 mg/dL   GLUCOSE, CAPILLARY     Status: Normal   Collection Time   10/15/12 12:00 PM      Component Value Range Comment   Glucose-Capillary 81  70 - 99 mg/dL   GLUCOSE, CAPILLARY     Status: Abnormal   Collection Time   10/15/12  4:50 PM      Component Value Range Comment   Glucose-Capillary 124 (*) 70 - 99 mg/dL    Comment 1 Documented in Chart      Comment 2 Notify RN     GLUCOSE, CAPILLARY     Status: Abnormal   Collection Time   10/15/12  8:57 PM      Component Value Range Comment   Glucose-Capillary 134 (*) 70 - 99 mg/dL    Comment 1 Notify RN     GLUCOSE, CAPILLARY     Status: Abnormal   Collection  Time   10/16/12  6:19 AM      Component Value Range Comment   Glucose-Capillary 118 (*) 70 - 99 mg/dL     Physical Findings: AIMS: Facial and Oral Movements Muscles of Facial Expression: None,  normal Lips and Perioral Area: None, normal Jaw: None, normal Tongue: None, normal,Extremity Movements Upper (arms, wrists, hands, fingers): None, normal Lower (legs, knees, ankles, toes): None, normal, Trunk Movements Neck, shoulders, hips: None, normal, Overall Severity Severity of abnormal movements (highest score from questions above): None, normal Incapacitation due to abnormal movements: None, normal Patient's awareness of abnormal movements (rate only patient's report): No Awareness, Dental Status Current problems with teeth and/or dentures?: No Does patient usually wear dentures?: No  CIWA:  CIWA-Ar Total: 0  COWS:  COWS Total Score: 0   Treatment Plan Summary: Daily contact with patient to assess and evaluate symptoms and progress in treatment Medication management  Plan: Continue with detox protocol. Plan for discharge tomorrow. Somalia Segler 10/16/2012, 11:27 AM

## 2012-10-16 NOTE — Progress Notes (Signed)
D:  Patient's self inventory sheet, patient sleeps well, has good appetite, normal energy level, good attention span.   Denied depression and hopelessness.  Denied withdrawals.   Denied SI.   Has experienced headache in past 24 hours.  Zero pain goal.  After discharge, plans "less isolation, more socialization".  Does have discharge plans.  No problems taking meds after discharge. A:   Medications given per MD order.  Support and encouragement given throughout day.  Support and safety checks completed as ordered. R:  Following treatment plan.   Denied SI and HI.  Denied A/V hallucinations.  Contracts for safety.  Patient remains safe and receptive on unit. Patient has attended and participated in groups today.

## 2012-10-16 NOTE — Progress Notes (Signed)
D: Pt visible on unit, attending milieu groups; appropriate and friendly interactions with both peers and staff.  Is providing much moral support for roommate.  Reports worrying about managing after discharge but remains hopeful and feels that hospitalization has been a positive step for her.  Pt is pleasant, cooperative, and compliant with unit expectations.  Eye contact is good despite pensive facial expression.  Affect anxious but appropriate to circumstances; mood is depressed/anxious/sad.  Thought process is logical and clear.  Denies SI/HI, AVH, and acute pain; contracting for safety.  CBG's for 11/11 as follows: 122-80, 1650-124, and 2057-134.  In no apparent distress.  A: Support and encouragement provided.  Pt reminded to place own welfare first but also reinforced for kindness toward roommate.  Medications administered according to med orders and POC.  Maintained on Q15 minute safety checks as per unit protocol.  R: Pt beginning to transition toward outpatient planning.  Safety maintained. Dion Saucier RN

## 2012-10-16 NOTE — Progress Notes (Signed)
  Aftercare Planning Group: 10/16/2012 8:45 AM  Pt attended discharge planning group and actively participated in group.  Michelle Clements reported today she is doing very well and getting closer to discharge. She reported she feels better in control of her depression and anxiety and does not have any SI currently.  Michelle Clements will follow up in the outpatient clinic in [place] and will live with her husband.  Michelle Clements was also educated about follow up at [facility] for support groups and addition resources.  [facility] Group Notes:  (Counselor/Nursing/MHT/Case Management/Adjunct) Feelings About Diagnosis  10/16/2012 1:15 PM  Type of Therapy:  Group Therapy  Participation Level:  Active  Participation Quality:  Appropriate and Attentive  Affect:  Appropriate  Cognitive:  Appropriate  Insight:  Good  Engagement in Group:  Good  Engagement in Therapy:  Good  Modes of Intervention:  Clarification, Education, Problem-solving, Support and Exploration  Summary of Progress/Problems:  Michelle Clements advised that she accepts her diagnosis and looks forward to moving on with her life.  She provided support and encouragement to another patient who has been isolating in his room.     Hannah Nail, LCSW 10/16/2012 11:51 AM

## 2012-10-17 LAB — GLUCOSE, CAPILLARY
Glucose-Capillary: 134 mg/dL — ABNORMAL HIGH (ref 70–99)
Glucose-Capillary: 76 mg/dL (ref 70–99)
Glucose-Capillary: 102 mg/dL — ABNORMAL HIGH (ref 70–99)

## 2012-10-17 MED ORDER — OMEPRAZOLE 20 MG PO CPDR: 20.0000 mg | DELAYED_RELEASE_CAPSULE | Freq: Every day | ORAL | Status: DC

## 2012-10-17 MED ORDER — MONTELUKAST SODIUM 10 MG PO TABS: 10.0000 mg | ORAL_TABLET | Freq: Every day | ORAL | Status: DC

## 2012-10-17 MED ORDER — GABAPENTIN 600 MG PO TABS
600.0000 mg | ORAL_TABLET | Freq: Three times a day (TID) | ORAL | Status: DC
  Filled 2012-10-17: qty 1

## 2012-10-17 MED ORDER — CYCLOBENZAPRINE HCL 10 MG PO TABS: 10.0000 mg | ORAL_TABLET | Freq: Four times a day (QID) | ORAL | Status: DC | PRN

## 2012-10-17 MED ORDER — PRAVASTATIN SODIUM 20 MG PO TABS: 20.0000 mg | ORAL_TABLET | Freq: Every day | ORAL | Status: DC

## 2012-10-17 MED ORDER — METFORMIN HCL 500 MG PO TABS: 500.0000 mg | ORAL_TABLET | Freq: Every day | ORAL | Status: DC

## 2012-10-17 MED ORDER — POLYETHYLENE GLYCOL 3350 17 GM/SCOOP PO POWD: 17.0000 g | Freq: Every day | ORAL | Status: DC | PRN

## 2012-10-17 MED ORDER — DULOXETINE HCL 60 MG PO CPEP: 60.0000 mg | ORAL_CAPSULE | Freq: Every day | ORAL | Status: DC

## 2012-10-17 MED ORDER — GABAPENTIN 600 MG PO TABS: 600.0000 mg | ORAL_TABLET | Freq: Three times a day (TID) | ORAL | Status: DC

## 2012-10-17 MED ORDER — CETIRIZINE HCL 10 MG PO TABS: 10.0000 mg | ORAL_TABLET | Freq: Every day | ORAL | Status: DC

## 2012-10-17 MED ORDER — LITHIUM CARBONATE 300 MG PO CAPS: 300.0000 mg | ORAL_CAPSULE | Freq: Every day | ORAL | Status: DC

## 2012-10-17 MED ORDER — RISPERIDONE 0.5 MG PO TABS: 0.5000 mg | ORAL_TABLET | Freq: Every day | ORAL | Status: DC

## 2012-10-17 MED ORDER — ALBUTEROL SULFATE 0.63 MG/3ML IN NEBU: 1.0000 | INHALATION_SOLUTION | Freq: Four times a day (QID) | RESPIRATORY_TRACT | Status: DC | PRN

## 2012-10-17 MED ORDER — AMITRIPTYLINE HCL 25 MG PO TABS: 25.0000 mg | ORAL_TABLET | Freq: Every day | ORAL | Status: DC

## 2012-10-17 MED ORDER — ALBUTEROL SULFATE HFA 108 (90 BASE) MCG/ACT IN AERS: 2.0000 | INHALATION_SPRAY | Freq: Four times a day (QID) | RESPIRATORY_TRACT | Status: DC | PRN

## 2012-10-17 NOTE — Progress Notes (Signed)
[facility] Group Notes:  (Counselor/Nursing/MHT/Case Management/Adjunct)  10/17/2012 5:20 PM  Type of Therapy:  Psychoeducational Skills  Participation Level:  Active  Participation Quality:  Monopolizing, Redirectable and Sharing  Affect:  Appropriate and Excited  Cognitive:  Alert, Appropriate and Oriented  Insight:  Limited  Engagement in Group:  Good  Engagement in Therapy:  n/a  Modes of Intervention:  Activity, Education, Limit-setting, Problem-solving, Socialization and Support  Summary of Progress/Problems: Armelia attended a psychoeducational group on labels. 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 Khylee was active and engaged in side talk at times but was easily redirectable while group discussed what labels are, how they change the way we think about and perceive the world, and listed positive and negative labels they have used or been called. Shian was given a homework assignment to list 10 words she has been labeled and to find the reality of that situation/label.     Wandra Scot 10/17/2012, 5:20 PM

## 2012-10-17 NOTE — Social Work (Signed)
Interdisciplinary Treatment Plan Update (Adult)  Date:  10/17/2012  Time Reviewed:  10:57 AM    Progress in Treatment: Attending groups:   Yes   Participating in groups:  Yes Taking medication as prescribed:  Yes Tolerating medication:  Yes Family/Significant othe contact made: Contact made with family Patient understands diagnosis:  Yes Discussing patient identified problems/goals with staff: Yes Medical problems stabilized or resolved: Yes Denies suicidal/homicidal ideation: yes -  Patient able to contract for safety Issues/concerns per patient self-inventory:  Other:  New problem(s) identified:  Reason for Continuation of Hospitalization: Patient discharging home today  Interventions implemented related to continuation of hospitalization:  Medication mgement; safety checks q 15 mins; coping skills development  Additional comments: Patient is finish detox and is ready to go home  Estimated length of stay: Patient is discharging today 1113/2013  Discharge Plan:  Outpatient follow up and discharge  today to home  New goal(s):  Review of initial/current patient goals per problem list:    1.  Goal(s): Eliminate SI/other thoughts of self harm   Met:  Yes  Target date: d/c  As evidenced by: Patient no longer endorse SI/HI or other thoughts of self harm.    2.  Goal (s):Reduce depression/anxiety  Met: Yes  Target date: d/c  As evidenced by: Patient will rate symptoms at four or below    3.  Goal(s):.stabilize on meds   Met:  Yes  Target date: d/c  As evidenced by: Patient will report being stabilized on medications - less symptomatic    4.  Goal(s): Refer for outpatient follow up   Met:  Yes  Target date: d/c  As evidenced by: Follow up appointment  scheduled    Attendees: Patient:   @TD  10:57 AM  Physican:  Patrick North, MD @TD  10:57 AM  Nursing:   10/17/2012 10:57 AM  Nursing:    @TD  10:57 AM  Clinical Social Worker:  Patton Salles,  LCSW @TD  10:57 AM  Other: Norval Gable, FNP 10/17/2012 10:57 AM   Other:         10/17/2012 10:57 AM Other:

## 2012-10-17 NOTE — Progress Notes (Signed)
[facility] Case Management Discharge Plan:  Will you be returning to the same living situation after discharge: Yes,    At discharge, do you have transportation home?:Yes,    Do you have the ability to pay for your medications:Yes,     Interagency Information:     Release of information consent forms completed and in the chart;  Patient's signature needed at discharge.  Patient to Follow up at:  Follow-up Information    Follow up with Dr. Dan Humphreys - [facility]. On 10/22/2012. (You are scheduled with Dr. Dan Humphreys on Monday, October 22, 2012 at 2:15 PM)    Contact information:   [address] [place], [place]   [address]  [phone]         Patient denies SI/HI:   Yes,       Safety Planning and Suicide Prevention discussed:  Yes,     Barrier to discharge identified:No.  Summary and Recommendations:   Michelle Clements 10/17/2012, 12:16 PM

## 2012-10-17 NOTE — Progress Notes (Signed)
  Aftercare Planning Group: 10/17/2012 8:45 AM  Pt attended discharge planning group and actively participated in group.  SW provided pt with today's workbook.  Pt presents with no current suicidal thoughts or reports of depression and anxiety. Michelle Clements is hopeful for discharge today back home with her husband and daughter. Michelle Clements reports she has learned a lot while being in the hospital and wants to continue to follow up with Dr. Dan Humphreys at [facility] outpatient in [place].  Michelle Clements also found flyers and education about the [employer] for support groups which she also wants to get involved in to decrease her isolation and increase support.  Michelle Clements has a bright affect with no distress noted or seen during session.  No barriers for discharge noted.  [facility] Group Notes:  (Counselor/Nursing/MHT/Case Management/Adjunct) Emotion Regulation Group 1:15-2:15 10/17/2012 1:15 PM  Type of Therapy:  Group Therapy  Participation Level:  Active  Participation Quality:  Appropriate and Attentive  Affect:  Appropriate  Cognitive:  Alert and Appropriate  Insight:  Good  Engagement in Group:  Good  Engagement in Therapy:  Good  Modes of Intervention:  Education, Problem-solving and Support  Summary of Progress/Problems:Patient discussed feelings of loneliness.  She disclosed the circumstances involved in her opiate dependence.    Hannah Nail, LCSW 10/17/2012 10:42 AM

## 2012-10-17 NOTE — [facility] Suicide Risk Assessment (Signed)
Suicide Risk Assessment  Discharge Assessment     Demographic Factors:  Female, caucasian, married  Mental Status Per Nursing Assessment::   On Admission:  NA  Current Mental Status by Physician: Patient is alert and oriented to 4. Denies AH/VH/SI/HI.  Loss Factors: Decrease in vocational status  Historical Factors: NA  Risk Reduction Factors:   Responsible for children under 44 years of age, Living with another person, especially a relative and Positive social support  Continued Clinical Symptoms:  Stable  Cognitive Features That Contribute To Risk:  Intact  Suicide Risk:  Minimal: No identifiable suicidal ideation.  Patients presenting with no risk factors but with morbid ruminations; may be classified as minimal risk based on the severity of the depressive symptoms  Discharge Diagnoses:   AXIS I:  Bipolar, mixed AXIS II:  No diagnosis AXIS III:   Past Medical History  Diagnosis Date  . Other malaise and fatigue   . Bipolar affective disorder, manic   . Hip pain, left   . Hypertension   . Neck pain   . Tobacco abuse   . Palpitations   . Allergic rhinitis   . Fibromyalgia   . Constipation   . IBS (irritable bowel syndrome)   . Hyperlipidemia   . GERD (gastroesophageal reflux disease)   . Depression   . Asthma   . PTSD (post-traumatic stress disorder)   . 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 Sleep apnea   . 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   AXIS IV:  occupational problems AXIS V:  61-70 mild symptoms  Plan Of Care/Follow-up recommendations:  Activity:  normal Diet:  normal Follow up with outpatient appointments.  Is patient on multiple antipsychotic therapies at discharge:  No   Has Patient had three or more failed trials of antipsychotic monotherapy by history:  No  Recommended Plan for Multiple Antipsychotic Therapies: NA  Maxmillian Carsey 10/17/2012, 9:55 AM

## 2012-10-17 NOTE — Progress Notes (Signed)
Patient has been up and active on the unit, attended group this evening and has voiced no complaints. Patient currently denies having pain, -si/hi/a/v hall. Patient reports she is looking forward to discharge on tomorrow. Support and encouragement offered, safety maintained on unit, will continue to monitor.

## 2012-10-17 NOTE — Discharge Summary (Signed)
Physician Discharge Summary Note  Patient:  Michelle Clements is an 46 y.o., female MRN:  [id number] DOB:  [birth year] Patient phone:  [phone] (home)  Patient address:   [address] [place] [address],   Date of Admission:  10/13/2012 Date of Discharge: 10/17/2012   Reason for Admission: Michelle Clements presents for admission for detox from hydrocodone and clonazepam on referral from Dr. Dan Humphreys, outpatient psychiatrist.  Dr. Dan Humphreys feels that her use of these medications are inhibiting her treatment for her bipolar disorder, even though they are being taken as prescribed.  Discharge Diagnoses:  Axis Diagnosis:   AXIS I:Bipolar, mixed    AXIS II: deferred AXIS III:   Past Medical History  Diagnosis Date  . Other malaise and fatigue   . Bipolar affective disorder, manic   . Hip pain, left   . Hypertension   . Neck pain   . Tobacco abuse   . Palpitations   . Allergic rhinitis   . Fibromyalgia   . Constipation   . IBS (irritable bowel syndrome)   . Hyperlipidemia   . GERD (gastroesophageal reflux disease)   . Depression   . Asthma   . PTSD (post-traumatic stress disorder)   . 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 Sleep apnea   . 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   AXIS IV: occupational problems  AXIS V: 61-70 mild symptoms  Level of Care:  OP  Hospital Course:  Pt tapered off both the hydrocodone and Klonopin without negative effect.  Consults:  None  Significant Diagnostic Studies:  None  Discharge Vitals:   Blood pressure 130/71, pulse 69, temperature 98.2 F (36.8 C), temperature source Oral, resp. rate 15, height 5' 11.25" (1.81 m), weight 117.482 kg (259 lb), last menstrual period 09/23/2012.   Lab Results:   Results for orders placed during the hospital encounter of 10/13/12 (from the past 72 hour(s))  GLUCOSE, CAPILLARY     Status: Abnormal   Collection Time   10/14/12  8:51 PM      Component Value Range Comment   Glucose-Capillary  149 (*) 70 - 99 mg/dL   GLUCOSE, CAPILLARY     Status: Abnormal   Collection Time   10/15/12  5:42 AM      Component Value Range Comment   Glucose-Capillary 132 (*) 70 - 99 mg/dL   GLUCOSE, CAPILLARY     Status: Normal   Collection Time   10/15/12 12:00 PM      Component Value Range Comment   Glucose-Capillary 81  70 - 99 mg/dL   GLUCOSE, CAPILLARY     Status: Abnormal   Collection Time   10/15/12  4:50 PM      Component Value Range Comment   Glucose-Capillary 124 (*) 70 - 99 mg/dL    Comment 1 Documented in Chart      Comment 2 Notify RN     GLUCOSE, CAPILLARY     Status: Abnormal   Collection Time   10/15/12  8:57 PM      Component Value Range Comment   Glucose-Capillary 134 (*) 70 - 99 mg/dL    Comment 1 Notify RN     GLUCOSE, CAPILLARY     Status: Abnormal   Collection Time   10/16/12  6:19 AM      Component Value Range Comment   Glucose-Capillary 118 (*) 70 - 99 mg/dL   GLUCOSE, CAPILLARY     Status: Abnormal   Collection Time   10/16/12  11:54 AM      Component Value Range Comment   Glucose-Capillary 105 (*) 70 - 99 mg/dL    Comment 1 Notify RN     GLUCOSE, CAPILLARY     Status: Abnormal   Collection Time   10/16/12  5:04 PM      Component Value Range Comment   Glucose-Capillary 110 (*) 70 - 99 mg/dL   GLUCOSE, CAPILLARY     Status: Abnormal   Collection Time   10/16/12  8:39 PM      Component Value Range Comment   Glucose-Capillary 157 (*) 70 - 99 mg/dL    Comment 1 Notify RN     GLUCOSE, CAPILLARY     Status: Abnormal   Collection Time   10/17/12  6:13 AM      Component Value Range Comment   Glucose-Capillary 134 (*) 70 - 99 mg/dL   GLUCOSE, CAPILLARY     Status: Normal   Collection Time   10/17/12 11:59 AM      Component Value Range Comment   Glucose-Capillary 76  70 - 99 mg/dL     Physical Findings: AIMS: Facial and Oral Movements Muscles of Facial Expression: None, normal Lips and Perioral Area: None, normal Jaw: None, normal Tongue: None,  normal, Extremity Movements Upper (arms, wrists, hands, fingers): None, normal Lower (legs, knees, ankles, toes): None, normal,  Trunk Movements Neck, shoulders, hips: None, normal,  verall Severity Severity of abnormal movements (highest score from questions above): None, normal Incapacitation due to abnormal movements: None, normal Patient's awareness of abnormal movements (rate only patient's report): No Awareness,  Dental Status Current problems with teeth and/or dentures?: No Does patient usually wear dentures?: No  CIWA:  CIWA-Ar Total: 0  COWS:  COWS Total Score: 0   Mental Status Exam: See Mental Status Examination and Suicide Risk Assessment completed by Attending Physician prior to discharge.  Discharge destination:  Home  Is patient on multiple antipsychotic therapies at discharge:  No   Has Patient had three or more failed trials of antipsychotic monotherapy by history:  No  Recommended Plan for Multiple Antipsychotic Therapies: na  Discharge Orders    Future Orders Please Complete By Expires   Diet - low sodium heart healthy      Increase activity slowly      Discharge instructions      Comments:   Take all of your medications as prescribed.  Be sure to keep ALL follow up appointments as scheduled. This is to ensure getting your refills on time to avoid any interruption in your medication.  If you find that you can not keep your appointment, call the clinic and reschedule. Be sure to tell the nurse if you will need a refill before your appointment.       Medication List     As of 10/17/2012 12:22 PM    STOP taking these medications         clonazePAM 1 MG tablet   Commonly known as: KLONOPIN      HYDROcodone-acetaminophen 7.5-500 MG per tablet   Commonly known as: LORTAB      ibuprofen 200 MG tablet   Commonly known as: ADVIL,MOTRIN      predniSONE 10 MG tablet   Commonly known as: DELTASONE      TAKE these medications      Indication     albuterol 0.63 MG/3ML nebulizer solution   Commonly known as: ACCUNEB   Take 3 mLs (0.63 mg total) by nebulization  every 6 (six) hours as needed for wheezing or shortness of breath.    Indication: Acute Bronchospasm      albuterol 108 (90 BASE) MCG/ACT inhaler   Commonly known as: PROVENTIL HFA;VENTOLIN HFA   Inhale 2 puffs into the lungs every 6 (six) hours as needed.    Indication: Disease involving Spasms of the Lungs      amitriptyline 25 MG tablet   Commonly known as: ELAVIL   Take 1 tablet (25 mg total) by mouth at bedtime.    Indication: Depression, Trouble Sleeping      cetirizine 10 MG tablet   Commonly known as: ZYRTEC   Take 1 tablet (10 mg total) by mouth daily.    Indication: Hayfever      cyclobenzaprine 10 MG tablet   Commonly known as: FLEXERIL   Take 1 tablet (10 mg total) by mouth every 6 (six) hours as needed.    Indication: Muscle Spasm      DULoxetine 60 MG capsule   Commonly known as: CYMBALTA   Take 1 capsule (60 mg total) by mouth daily.    Indication: Major Depressive Disorder      gabapentin 600 MG tablet   Commonly known as: NEURONTIN   Take 1 tablet (600 mg total) by mouth every 8 (eight) hours.    Indication: Neurogenic Pain, mood stabilization      lithium carbonate 300 MG capsule   Take 1 capsule (300 mg total) by mouth at bedtime.    Indication: Manic-Depression      metFORMIN 500 MG tablet   Commonly known as: GLUCOPHAGE   Take 1 tablet (500 mg total) by mouth daily with breakfast.    Indication: Type 2 Diabetes      montelukast 10 MG tablet   Commonly known as: SINGULAIR   Take 1 tablet (10 mg total) by mouth daily.    Indication: Asthma      NON FORMULARY   CPAP MASK, FACIAL CUSHION AND TUBING.       omeprazole 20 MG capsule   Commonly known as: PRILOSEC   Take 1 capsule (20 mg total) by mouth daily.    Indication: Gastroesophageal Reflux Disease with Current Symptoms      polyethylene glycol powder powder   Commonly known  as: GLYCOLAX/MIRALAX   Take 17 g by mouth daily as needed.    Indication: Constipation      pravastatin 20 MG tablet   Commonly known as: PRAVACHOL   Take 1 tablet (20 mg total) by mouth daily.    Indication: hypercholesterolemia      risperiDONE 0.5 MG tablet   Commonly known as: RISPERDAL   Take 1 tablet (0.5 mg total) by mouth daily.    Indication: Manic-Depression           Follow-up Information    Follow up with Dr. Dan Humphreys - [facility]. On 10/22/2012. (You are scheduled with Dr. Dan Humphreys on Monday, October 22, 2012 at 2:15 PM)    Contact information:   [address] [place], [place]   [address]  [phone]         Follow-up recommendations: Take all of your medications as prescribed.  Be sure to keep ALL follow up appointments as scheduled. This is to ensure getting your refills on time to avoid any interruption in your medication.  If you find that you can not keep your appointment, call the clinic and reschedule. Be sure to tell the nurse if you will need a  refill before your appointment.   Comments:  Clonidine patch to be removed Friday morning.             Lidoderm patch needs to be removed Thursday at 8AM.  Signed: Norval Gable FNP-BC 10/17/2012, 12:22 PM

## 2012-10-17 NOTE — Progress Notes (Signed)
Patient ID: Michelle Clements, female   DOB: 11-27-1966, 46 y.o.   MRN: [id number] Patient denies SI/HI and A/V hallucinations; patient received copy of AVS and prescriptions and both the AVS and prescriptions; patient discharged per physician orders; patient was returned her cpap machine, wipes and water

## 2012-10-18 ENCOUNTER — Ambulatory Visit (HOSPITAL_COMMUNITY): Payer: Self-pay | Admitting: Psychiatry

## 2012-10-18 LAB — VITAMIN D 1,25 DIHYDROXY
Vitamin D2 1, 25 (OH)2: <8 pg/mL
Vitamin D3 1, 25 (OH)2: 38 pg/mL

## 2012-10-18 NOTE — Discharge Summary (Signed)
Reviewed

## 2012-10-22 ENCOUNTER — Ambulatory Visit (INDEPENDENT_AMBULATORY_CARE_PROVIDER_SITE_OTHER): Payer: 59 | Admitting: Psychiatry

## 2012-10-22 ENCOUNTER — Encounter (HOSPITAL_COMMUNITY): Payer: Self-pay | Admitting: Psychiatry

## 2012-10-22 VITALS — Wt 257.4 lb

## 2012-10-22 DIAGNOSIS — F319 Bipolar disorder, unspecified: Secondary | ICD-10-CM

## 2012-10-22 DIAGNOSIS — E559 Vitamin D deficiency, unspecified: Secondary | ICD-10-CM

## 2012-10-22 DIAGNOSIS — F3160 Bipolar disorder, current episode mixed, unspecified: Secondary | ICD-10-CM

## 2012-10-22 DIAGNOSIS — F17200 Nicotine dependence, unspecified, uncomplicated: Secondary | ICD-10-CM

## 2012-10-22 DIAGNOSIS — IMO0001 Reserved for inherently not codable concepts without codable children: Secondary | ICD-10-CM

## 2012-10-22 MED ORDER — GABAPENTIN 600 MG PO TABS: 600.0000 mg | ORAL_TABLET | Freq: Four times a day (QID) | ORAL | Status: DC

## 2012-10-22 NOTE — Progress Notes (Signed)
[facility] MD Progress Note  10/22/2012 2:59 PM Michelle Clements  MRN:  [id number]  S: Patient seen in the office with her husband.  Husband says that he can tell the improvement in her by the way she walks with more pace to her walk, actually picking up her feet and not sliding them.  Both feel that she is doing better.  Pain level 4 whereas before was 9 to 10 pretty constantly.   Diagnosis:   Axis I: Bipolar, mixed Axis II: No diagnosis Axis III:  Past Medical History  Diagnosis Date  . Other malaise and fatigue   . Bipolar affective disorder, manic   . Hip pain, left   . Hypertension   . Neck pain   . Tobacco abuse   . Palpitations   . Allergic rhinitis   . Fibromyalgia   . Constipation   . IBS (irritable bowel syndrome)   . Hyperlipidemia   . GERD (gastroesophageal reflux disease)   . Depression   . Asthma   . PTSD (post-traumatic stress disorder)   . 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 Sleep apnea   . 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   Axis IV: occupational problems Axis V: 51-60 moderate symptoms  ADL's:  Intact  Sleep: Fair, about 6 or 7 hours a night  Appetite:  Good  Mental Status Examination/Evaluation: Objective:  Appearance: Casual  Eye Contact::  Good  Speech:  Clear and Coherent  Volume:  Normal  Mood:  Anxious  Affect:  Appropriate  Thought Process:  Coherent  Orientation:  Full  Thought Content:  WDL  Suicidal Thoughts:  No  Homicidal Thoughts:  No  Memory:  Immediate;   Fair Recent;   Fair Remote;   Fair  Judgement:  Fair  Insight:  Fair  Psychomotor Activity:  Normal  Concentration:  Fair  Recall:  Fair  Akathisia:  No  Handed:  Right  AIMS (if indicated):     Assets:  Communication Skills Desire for Improvement Housing Social Support  Sleep:      Vital Signs:Weight 257 lb 6.4 oz (116.756 kg), last menstrual period 10/13/2012. Current Medications: Current Outpatient Prescriptions  Medication Sig Dispense  Refill  . amitriptyline (ELAVIL) 25 MG tablet Take 1 tablet (25 mg total) by mouth at bedtime.  30 tablet  0  . cetirizine (ZYRTEC) 10 MG tablet Take 1 tablet (10 mg total) by mouth daily.  30 tablet  0  . cyclobenzaprine (FLEXERIL) 10 MG tablet Take 1 tablet (10 mg total) by mouth every 6 (six) hours as needed.  30 tablet  0  . DULoxetine (CYMBALTA) 60 MG capsule Take 1 capsule (60 mg total) by mouth daily.  30 capsule  0  . gabapentin (NEURONTIN) 600 MG tablet Take 1 tablet (600 mg total) by mouth every 8 (eight) hours.  90 tablet  0  . lithium carbonate 300 MG capsule Take 1 capsule (300 mg total) by mouth at bedtime.  30 capsule  0  . risperiDONE (RISPERDAL) 0.5 MG tablet Take 1 tablet (0.5 mg total) by mouth daily.  30 tablet  2  . albuterol (ACCUNEB) 0.63 MG/3ML nebulizer solution Take 3 mLs (0.63 mg total) by nebulization every 6 (six) hours as needed for wheezing or shortness of breath.  75 mL  0  . albuterol (PROAIR HFA) 108 (90 BASE) MCG/ACT inhaler Inhale 2 puffs into the lungs every 6 (six) hours as needed.  1 Inhaler  0  . metFORMIN (  GLUCOPHAGE) 500 MG tablet Take 1 tablet (500 mg total) by mouth daily with breakfast.  30 tablet  0  . montelukast (SINGULAIR) 10 MG tablet Take 1 tablet (10 mg total) by mouth daily.  30 tablet  0  . NON FORMULARY CPAP MASK, FACIAL CUSHION AND TUBING.       Marland Kitchen omeprazole (PRILOSEC) 20 MG capsule Take 1 capsule (20 mg total) by mouth daily.  30 capsule  0  . polyethylene glycol powder (GLYCOLAX/MIRALAX) powder Take 17 g by mouth daily as needed.  255 g  0  . pravastatin (PRAVACHOL) 20 MG tablet Take 1 tablet (20 mg total) by mouth daily.  30 tablet  0  . [DISCONTINUED] buPROPion (WELLBUTRIN SR) 150 MG 12 hr tablet Take 1 tablet (150 mg total) by mouth 2 (two) times daily.  60 tablet  1    Lab Results:  No results found for this or any previous visit (from the past 48 hour(s)). Vitamin D level in typical range, but only at 39.  Some clinicians like it to  be in the 59 and above range.  Will push Vit D replacement and see if the level can be increased to that range.   Physical Findings: AIMS:  , ,  ,  ,    CIWA:    COWS:     Treatment Plan Summary: Daily contact with patient to assess and evaluate symptoms and progress in treatment Medication management  Plan: Continue current regimen Pt wants to stop either the Risperdal or the Lithium.  After discussion she concluded to stop the Risperdal and see.  Add Vitamin D back to regimen.  Could use Salonpas for the back pain.  Lateef Juncaj 10/22/2012, 2:59 PM

## 2012-10-22 NOTE — Patient Instructions (Signed)
"  I am Wishes [employer]" by Marylene Buerger and Lyndal Pulley may be helpful for meditation  "[address]" Chants and Cephus Shelling of the Native American is an awesome sound track for meditation.  Chakra Healing Chants is another source of music for meditation.  Native Wisdom for [employer] by Wyn Quaker Schafe may be an interesting resource for you.  "[place]" compiled by [employer] could be helpful.

## 2012-10-22 NOTE — Progress Notes (Signed)
Patient Discharge Instructions:  After Visit Summary (AVS):   Faxed to:  10/22/12 Psychiatric Admission Assessment Note:   Faxed to:  10/22/12 Suicide Risk Assessment - Discharge Assessment:   Faxed to:  10/22/12 Next Level Care Provider Has Access to the EMR, 10/22/12 Records provided to Dr. Dan Humphreys via [facility]  Jerelene Redden, 10/22/2012, 3:00 PM

## 2012-10-25 ENCOUNTER — Telehealth (HOSPITAL_COMMUNITY): Payer: Self-pay | Admitting: *Deleted

## 2012-10-26 NOTE — Telephone Encounter (Signed)
Phone message completed in the phone message section.  

## 2012-11-03 ENCOUNTER — Other Ambulatory Visit (HOSPITAL_COMMUNITY): Payer: Self-pay | Admitting: Psychiatry

## 2012-11-05 ENCOUNTER — Other Ambulatory Visit (HOSPITAL_COMMUNITY): Payer: Self-pay | Admitting: Psychiatry

## 2012-11-06 ENCOUNTER — Other Ambulatory Visit (HOSPITAL_COMMUNITY): Payer: Self-pay | Admitting: *Deleted

## 2012-11-06 ENCOUNTER — Other Ambulatory Visit (HOSPITAL_COMMUNITY): Payer: Self-pay | Admitting: Psychiatry

## 2012-11-06 DIAGNOSIS — F319 Bipolar disorder, unspecified: Secondary | ICD-10-CM

## 2012-11-06 MED ORDER — LITHIUM CARBONATE 300 MG PO CAPS: 300.0000 mg | ORAL_CAPSULE | Freq: Every day | ORAL | Status: DC

## 2012-11-09 ENCOUNTER — Telehealth (HOSPITAL_COMMUNITY): Payer: Self-pay | Admitting: Psychiatry

## 2012-11-09 ENCOUNTER — Ambulatory Visit (HOSPITAL_COMMUNITY): Payer: Self-pay | Admitting: Psychiatry

## 2012-11-12 MED ORDER — GABAPENTIN 600 MG PO TABS: 600.0000 mg | ORAL_TABLET | Freq: Four times a day (QID) | ORAL | Status: DC

## 2012-11-12 NOTE — Addendum Note (Signed)
Addended by: Mike Craze on: 11/12/2012 02:48 PM   Modules accepted: Orders

## 2012-11-12 NOTE — Telephone Encounter (Signed)
Script written

## 2012-11-19 ENCOUNTER — Encounter (HOSPITAL_COMMUNITY): Payer: Self-pay | Admitting: Psychiatry

## 2012-11-19 ENCOUNTER — Ambulatory Visit (INDEPENDENT_AMBULATORY_CARE_PROVIDER_SITE_OTHER): Payer: 59 | Admitting: Psychiatry

## 2012-11-19 VITALS — Wt 260.0 lb

## 2012-11-19 DIAGNOSIS — E559 Vitamin D deficiency, unspecified: Secondary | ICD-10-CM

## 2012-11-19 DIAGNOSIS — IMO0001 Reserved for inherently not codable concepts without codable children: Secondary | ICD-10-CM

## 2012-11-19 DIAGNOSIS — F319 Bipolar disorder, unspecified: Secondary | ICD-10-CM

## 2012-11-19 DIAGNOSIS — F3160 Bipolar disorder, current episode mixed, unspecified: Secondary | ICD-10-CM

## 2012-11-19 MED ORDER — GABAPENTIN 600 MG PO TABS: 600.0000 mg | ORAL_TABLET | Freq: Four times a day (QID) | ORAL | Status: DC

## 2012-11-19 MED ORDER — CYCLOBENZAPRINE HCL 10 MG PO TABS: 10.0000 mg | ORAL_TABLET | Freq: Four times a day (QID) | ORAL | Status: DC | PRN

## 2012-11-19 MED ORDER — AMITRIPTYLINE HCL 25 MG PO TABS: 25.0000 mg | ORAL_TABLET | Freq: Every day | ORAL | Status: DC

## 2012-11-19 MED ORDER — DULOXETINE HCL 60 MG PO CPEP: 60.0000 mg | ORAL_CAPSULE | Freq: Every day | ORAL | Status: DC

## 2012-11-19 NOTE — Patient Instructions (Signed)
Pay attention to the yoga moves that focus on your back and stomach muscles.  Use those moves EVERYDAY at home.   The key is EVERYDAY.  MARCH and September are when the length of night and day are changing the most rapid.  That is when the folks with bipolar mood disorder have the toughest time.   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

## 2012-11-19 NOTE — Progress Notes (Signed)
Script written.[facility] MD Progress Note  10/22/2012 2:59 PM Michelle Clements  MRN:  [id number]  S: Pt comes to appointment by herself.  She reports that she has been able to walk all day on concrete shopping without severe consequences.  She reports her pain overall at a 3 with an occasional body ache.  [facility]  Suggested that she try that and remember that there is no comparison, no competition, and no judgement in yoga.  She sees no need to change the medications.  She has stopped the lithium.  I suggested that she be very cautions about the spring and fall and particularly the sleep pattern.  She was invited to share that info with her husband and ask his assistance in remembering that at that time.   Diagnosis:   Axis I: Bipolar, mixed Axis II: No diagnosis Axis III:  Past Medical History  Diagnosis Date  . Other malaise and fatigue   . Bipolar affective disorder, manic   . Hip pain, left   . Hypertension   . Neck pain   . Tobacco abuse   . Palpitations   . Allergic rhinitis   . Fibromyalgia   . Constipation   . IBS (irritable bowel syndrome)   . Hyperlipidemia   . GERD (gastroesophageal reflux disease)   . Depression   . Asthma   . PTSD (post-traumatic stress disorder)   . 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 Sleep apnea   . 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   Axis IV: occupational problems Axis V: 51-60 moderate symptoms  ADL's:  Intact  Sleep: Fair, about 6 or 7 hours a night  Appetite:  Good  Mental Status Examination/Evaluation: Objective:  Appearance: Casual  Eye Contact::  Good  Speech:  Clear and Coherent  Volume:  Normal  Mood:  Anxious  Affect:  Appropriate  Thought Process:  Coherent  Orientation:  Full  Thought Content:  WDL  Suicidal Thoughts:  No  Homicidal Thoughts:  No  Memory:  Immediate;   Fair Recent;   Fair Remote;   Fair  Judgement:  Fair  Insight:  Fair  Psychomotor  Activity:  Normal  Concentration:  Fair  Recall:  Fair  Akathisia:  No  Handed:  Right  AIMS (if indicated):     Assets:  Communication Skills Desire for Improvement Housing Social Support  Sleep:      Vital Signs:Wt 260 lb (117.935 kg)  LMP 11/09/2012  Current Medications: Current outpatient prescriptions:amitriptyline (ELAVIL) 25 MG tablet, Take 1 tablet (25 mg total) by mouth at bedtime., Disp: 30 tablet, Rfl: 0;  cyclobenzaprine (FLEXERIL) 10 MG tablet, Take 1 tablet (10 mg total) by mouth every 6 (six) hours as needed., Disp: 30 tablet, Rfl: 0;  DULoxetine (CYMBALTA) 60 MG capsule, Take 1 capsule (60 mg total) by mouth daily., Disp: 30 capsule, Rfl: 0 gabapentin (NEURONTIN) 600 MG tablet, Take 1 tablet (600 mg total) by mouth 4 (four) times daily., Disp: 120 tablet, Rfl: 1;  risperiDONE (RISPERDAL) 0.5 MG tablet, Take 1 tablet (0.5 mg total) by mouth daily., Disp: 30 tablet, Rfl: 2;  albuterol (ACCUNEB) 0.63 MG/3ML nebulizer solution, Take 3 mLs (0.63 mg total) by nebulization every 6 (six) hours as needed for wheezing or shortness of breath., Disp: 75 mL, Rfl: 0 albuterol (PROAIR HFA) 108 (90 BASE) MCG/ACT inhaler, Inhale 2 puffs into the lungs every 6 (six) hours as needed., Disp: 1 Inhaler,  Rfl: 0;  cetirizine (ZYRTEC) 10 MG tablet, Take 1 tablet (10 mg total) by mouth daily., Disp: 30 tablet, Rfl: 0;  lithium carbonate 300 MG capsule, Take 1 capsule (300 mg total) by mouth at bedtime., Disp: 30 capsule, Rfl: 0 metFORMIN (GLUCOPHAGE) 500 MG tablet, Take 1 tablet (500 mg total) by mouth daily with breakfast., Disp: 30 tablet, Rfl: 0;  montelukast (SINGULAIR) 10 MG tablet, Take 1 tablet (10 mg total) by mouth daily., Disp: 30 tablet, Rfl: 0;  NON FORMULARY, CPAP MASK, FACIAL CUSHION AND TUBING. , Disp: , Rfl: ;  omeprazole (PRILOSEC) 20 MG capsule, Take 1 capsule (20 mg total) by mouth daily., Disp: 30 capsule, Rfl: 0 polyethylene glycol powder (GLYCOLAX/MIRALAX) powder, Take 17 g by mouth  daily as needed., Disp: 255 g, Rfl: 0;  pravastatin (PRAVACHOL) 20 MG tablet, Take 1 tablet (20 mg total) by mouth daily., Disp: 30 tablet, Rfl: 0;  [DISCONTINUED] buPROPion (WELLBUTRIN SR) 150 MG 12 hr tablet, Take 1 tablet (150 mg total) by mouth 2 (two) times daily., Disp: 60 tablet, Rfl: 1   Lab Results:  Recent Results (from the past 8736 hour(s))  LITHIUM LEVEL   Collection Time   02/21/12 11:53 AM      Component Value Range   Lithium Lvl 0.20 (*) 0.80 - 1.40 mEq/L  CBC WITH DIFFERENTIAL   Collection Time   02/21/12 11:53 AM      Component Value Range   WBC 8.4  4.0 - 10.5 K/uL   RBC 4.17  3.87 - 5.11 MIL/uL   Hemoglobin 13.0  12.0 - 15.0 g/dL   HCT 16.1  09.6 - 04.5 %   MCV 92.3  78.0 - 100.0 fL   MCH 31.2  26.0 - 34.0 pg   MCHC 33.8  30.0 - 36.0 g/dL   RDW 40.9  81.1 - 91.4 %   Platelets 314  150 - 400 K/uL   Neutrophils Relative 61  43 - 77 %   Neutro Abs 5.1  1.7 - 7.7 K/uL   Lymphocytes Relative 28  12 - 46 %   Lymphs Abs 2.3  0.7 - 4.0 K/uL   Monocytes Relative 5  3 - 12 %   Monocytes Absolute 0.4  0.1 - 1.0 K/uL   Eosinophils Relative 6 (*) 0 - 5 %   Eosinophils Absolute 0.5  0.0 - 0.7 K/uL   Basophils Relative 1  0 - 1 %   Basophils Absolute 0.1  0.0 - 0.1 K/uL   Smear Review Criteria for review not met    COMPREHENSIVE METABOLIC PANEL   Collection Time   02/21/12 11:53 AM      Component Value Range   Sodium 140  135 - 145 mEq/L   Potassium 4.3  3.5 - 5.3 mEq/L   Chloride 104  96 - 112 mEq/L   CO2 26  19 - 32 mEq/L   Glucose, Bld 134 (*) 70 - 99 mg/dL   BUN 9  6 - 23 mg/dL   Creat 7.82  9.56 - 2.13 mg/dL   Total Bilirubin 0.4  0.3 - 1.2 mg/dL   Alkaline Phosphatase 48  39 - 117 U/L   AST 43 (*) 0 - 37 U/L   ALT 50 (*) 0 - 35 U/L   Total Protein 6.3  6.0 - 8.3 g/dL   Albumin 4.6  3.5 - 5.2 g/dL   Calcium 9.9  8.4 - 08.6 mg/dL  TSH   Collection Time   02/21/12 11:53  AM      Component Value Range   TSH 2.651  0.350 - 4.500 uIU/mL  DRUGS OF ABUSE SCREEN  W ALC, ROUTINE URINE   Collection Time   10/13/12  6:21 PM      Component Value Range   Amphetamine Screen, Ur NEGATIVE  Negative   Marijuana Metabolite NEGATIVE  Negative   Barbiturate Quant, Ur NEGATIVE  Negative   Methadone NEGATIVE  Negative   Propoxyphene NEGATIVE  Negative   Benzodiazepines. NEGATIVE  Negative   Phencyclidine (PCP) NEGATIVE  Negative   Cocaine Metabolites NEGATIVE  Negative   Opiate Screen, Urine POSITIVE (*) Negative   Ethyl Alcohol <10  <10 mg/dL   Creatinine,U 161.2    URINALYSIS, ROUTINE W REFLEX MICROSCOPIC   Collection Time   10/13/12  6:21 PM      Component Value Range   Color, Urine YELLOW  YELLOW   APPearance CLEAR  CLEAR   Specific Gravity, Urine 1.018  1.005 - 1.030   pH 6.5  5.0 - 8.0   Glucose, UA NEGATIVE  NEGATIVE mg/dL   Hgb urine dipstick MODERATE (*) NEGATIVE   Bilirubin Urine NEGATIVE  NEGATIVE   Ketones, ur NEGATIVE  NEGATIVE mg/dL   Protein, ur NEGATIVE  NEGATIVE mg/dL   Urobilinogen, UA 1.0  0.0 - 1.0 mg/dL   Nitrite NEGATIVE  NEGATIVE   Leukocytes, UA TRACE (*) NEGATIVE  URINE RAPID DRUG SCREEN (HOSP PERFORMED)   Collection Time   10/13/12  6:21 PM      Component Value Range   Opiates POSITIVE (*) NONE DETECTED   Cocaine NONE DETECTED  NONE DETECTED   Benzodiazepines NONE DETECTED  NONE DETECTED   Amphetamines NONE DETECTED  NONE DETECTED   Tetrahydrocannabinol NONE DETECTED  NONE DETECTED   Barbiturates NONE DETECTED  NONE DETECTED  URINE MICROSCOPIC-ADD ON   Collection Time   10/13/12  6:21 PM      Component Value Range   Squamous Epithelial / LPF RARE  RARE   WBC, UA 0-2  <3 WBC/hpf  OPIATE, QUANTITATIVE, URINE   Collection Time   10/13/12  6:21 PM      Component Value Range   Morphine, Confirm NEGATIVE  Cutoff:50 ng/mL   Codeine Urine NEGATIVE  Cutoff:50 ng/mL   Hydromorphone GC/MS Conf 69  Cutoff:50 ng/mL   Hydrocodone 126  Cutoff:50 ng/mL   Norhydrocodone, Ur 421  Cutoff:50 ng/mL   Oxycodone, ur NEGATIVE  Cutoff:50  ng/mL   Noroxycodone, Ur NEGATIVE  Cutoff:50 ng/mL   6 Monoacetylmorphine, Ur-Confirm NEGATIVE  Cutoff:5 ng/mL   Oxymorphone NEGATIVE  Cutoff:50 ng/mL  COMPREHENSIVE METABOLIC PANEL   Collection Time   10/13/12  7:28 PM      Component Value Range   Sodium 140  135 - 145 mEq/L   Potassium 4.0  3.5 - 5.1 mEq/L   Chloride 101  96 - 112 mEq/L   CO2 28  19 - 32 mEq/L   Glucose, Bld 152 (*) 70 - 99 mg/dL   BUN 10  6 - 23 mg/dL   Creatinine, Ser 0.96  0.50 - 1.10 mg/dL   Calcium 04.5  8.4 - 40.9 mg/dL   Total Protein 6.9  6.0 - 8.3 g/dL   Albumin 4.4  3.5 - 5.2 g/dL   AST 36  0 - 37 U/L   ALT 38 (*) 0 - 35 U/L   Alkaline Phosphatase 66  39 - 117 U/L   Total Bilirubin 0.3  0.3 - 1.2 mg/dL  GFR calc non Af Amer 74 (*) >90 mL/min   GFR calc Af Amer 85 (*) >90 mL/min  CBC WITH DIFFERENTIAL   Collection Time   10/13/12  7:28 PM      Component Value Range   WBC 11.0 (*) 4.0 - 10.5 K/uL   RBC 4.32  3.87 - 5.11 MIL/uL   Hemoglobin 11.5 (*) 12.0 - 15.0 g/dL   HCT 16.1 (*) 09.6 - 04.5 %   MCV 80.8  78.0 - 100.0 fL   MCH 26.6  26.0 - 34.0 pg   MCHC 33.0  30.0 - 36.0 g/dL   RDW 40.9  81.1 - 91.4 %   Platelets 386  150 - 400 K/uL   Neutrophils Relative 64  43 - 77 %   Neutro Abs 7.0  1.7 - 7.7 K/uL   Lymphocytes Relative 27  12 - 46 %   Lymphs Abs 2.9  0.7 - 4.0 K/uL   Monocytes Relative 5  3 - 12 %   Monocytes Absolute 0.6  0.1 - 1.0 K/uL   Eosinophils Relative 4  0 - 5 %   Eosinophils Absolute 0.4  0.0 - 0.7 K/uL   Basophils Relative 1  0 - 1 %   Basophils Absolute 0.1  0.0 - 0.1 K/uL  GLUCOSE, CAPILLARY   Collection Time   10/13/12  8:59 PM      Component Value Range   Glucose-Capillary 122 (*) 70 - 99 mg/dL  GLUCOSE, CAPILLARY   Collection Time   10/14/12  6:33 AM      Component Value Range   Glucose-Capillary 135 (*) 70 - 99 mg/dL  COMPREHENSIVE METABOLIC PANEL   Collection Time   10/14/12  6:45 AM      Component Value Range   Sodium 136  135 - 145 mEq/L   Potassium 4.1   3.5 - 5.1 mEq/L   Chloride 100  96 - 112 mEq/L   CO2 28  19 - 32 mEq/L   Glucose, Bld 132 (*) 70 - 99 mg/dL   BUN 12  6 - 23 mg/dL   Creatinine, Ser 7.82  0.50 - 1.10 mg/dL   Calcium 9.8  8.4 - 95.6 mg/dL   Total Protein 6.8  6.0 - 8.3 g/dL   Albumin 4.2  3.5 - 5.2 g/dL   AST 37  0 - 37 U/L   ALT 39 (*) 0 - 35 U/L   Alkaline Phosphatase 65  39 - 117 U/L   Total Bilirubin 0.5  0.3 - 1.2 mg/dL   GFR calc non Af Amer 76 (*) >90 mL/min   GFR calc Af Amer 88 (*) >90 mL/min  CBC   Collection Time   10/14/12  6:45 AM      Component Value Range   WBC 8.9  4.0 - 10.5 K/uL   RBC 4.25  3.87 - 5.11 MIL/uL   Hemoglobin 11.4 (*) 12.0 - 15.0 g/dL   HCT 21.3 (*) 08.6 - 57.8 %   MCV 80.9  78.0 - 100.0 fL   MCH 26.8  26.0 - 34.0 pg   MCHC 33.1  30.0 - 36.0 g/dL   RDW 46.9  62.9 - 52.8 %   Platelets 334  150 - 400 K/uL  TSH   Collection Time   10/14/12  6:45 AM      Component Value Range   TSH 3.261  0.350 - 4.500 uIU/mL  VITAMIN D 1,25 DIHYDROXY   Collection Time   10/14/12  6:45 AM      Component Value Range   Vitamin D 1, 25 (OH) Total 38  18 - 72 pg/mL   Vitamin D3 1, 25 (OH) 38     Vitamin D2 1, 25 (OH) <8    VITAMIN D 25 HYDROXY   Collection Time   10/14/12  6:45 AM      Component Value Range   Vit D, 25-Hydroxy 39  30 - 89 ng/mL  BILIRUBIN, DIRECT   Collection Time   10/14/12  6:45 AM      Component Value Range   Bilirubin, Direct <0.1  0.0 - 0.3 mg/dL  GLUCOSE, CAPILLARY   Collection Time   10/14/12 11:48 AM      Component Value Range   Glucose-Capillary 115 (*) 70 - 99 mg/dL   Comment 1 Notify RN     Comment 2 Documented in Chart    GLUCOSE, CAPILLARY   Collection Time   10/14/12  8:51 PM      Component Value Range   Glucose-Capillary 149 (*) 70 - 99 mg/dL  GLUCOSE, CAPILLARY   Collection Time   10/15/12  5:42 AM      Component Value Range   Glucose-Capillary 132 (*) 70 - 99 mg/dL  GLUCOSE, CAPILLARY   Collection Time   10/15/12 12:00 PM      Component  Value Range   Glucose-Capillary 81  70 - 99 mg/dL  GLUCOSE, CAPILLARY   Collection Time   10/15/12  4:50 PM      Component Value Range   Glucose-Capillary 124 (*) 70 - 99 mg/dL   Comment 1 Documented in Chart     Comment 2 Notify RN    GLUCOSE, CAPILLARY   Collection Time   10/15/12  8:57 PM      Component Value Range   Glucose-Capillary 134 (*) 70 - 99 mg/dL   Comment 1 Notify RN    GLUCOSE, CAPILLARY   Collection Time   10/16/12  6:19 AM      Component Value Range   Glucose-Capillary 118 (*) 70 - 99 mg/dL  GLUCOSE, CAPILLARY   Collection Time   10/16/12 11:54 AM      Component Value Range   Glucose-Capillary 105 (*) 70 - 99 mg/dL   Comment 1 Notify RN    GLUCOSE, CAPILLARY   Collection Time   10/16/12  5:04 PM      Component Value Range   Glucose-Capillary 110 (*) 70 - 99 mg/dL  GLUCOSE, CAPILLARY   Collection Time   10/16/12  8:39 PM      Component Value Range   Glucose-Capillary 157 (*) 70 - 99 mg/dL   Comment 1 Notify RN    GLUCOSE, CAPILLARY   Collection Time   10/17/12  6:13 AM      Component Value Range   Glucose-Capillary 134 (*) 70 - 99 mg/dL  GLUCOSE, CAPILLARY   Collection Time   10/17/12 11:59 AM      Component Value Range   Glucose-Capillary 76  70 - 99 mg/dL  GLUCOSE, CAPILLARY   Collection Time   10/17/12  5:00 PM      Component Value Range   Glucose-Capillary 102 (*) 70 - 99 mg/dL   Comment 1 Notify RN     Vitamin D level in typical range, but only at 39.  Some clinicians like it to be in the 59 and above range.  Will push Vit D replacement and see if the level can be  increased to that range.   Physical Findings: AIMS:  , ,  ,  ,    CIWA:    COWS:     Treatment Plan Summary: Daily contact with patient to assess and evaluate symptoms and progress in treatment Medication management  Plan: I took her vitals.  I reviewed CC, tobacco/med/surg Hx, meds effects/ side effects, problem list, therapies and responses as well as current  situation/symptoms discussed options. See orders and pt instructions for more details.  Willard Madrigal 10/22/2012, 2:59 PM

## 2012-12-07 NOTE — Progress Notes (Signed)
Agree with assessment and plan Luisantonio Adinolfi A. Goran Olden, M.D. 

## 2012-12-25 ENCOUNTER — Encounter (HOSPITAL_COMMUNITY): Payer: Self-pay | Admitting: Psychiatry

## 2012-12-25 ENCOUNTER — Ambulatory Visit (INDEPENDENT_AMBULATORY_CARE_PROVIDER_SITE_OTHER): Payer: No Typology Code available for payment source | Admitting: Psychiatry

## 2012-12-25 VITALS — Wt 263.0 lb

## 2012-12-25 DIAGNOSIS — E559 Vitamin D deficiency, unspecified: Secondary | ICD-10-CM

## 2012-12-25 DIAGNOSIS — Z87891 Personal history of nicotine dependence: Secondary | ICD-10-CM

## 2012-12-25 DIAGNOSIS — M549 Dorsalgia, unspecified: Secondary | ICD-10-CM

## 2012-12-25 DIAGNOSIS — IMO0001 Reserved for inherently not codable concepts without codable children: Secondary | ICD-10-CM

## 2012-12-25 DIAGNOSIS — F3160 Bipolar disorder, current episode mixed, unspecified: Secondary | ICD-10-CM

## 2012-12-25 DIAGNOSIS — F319 Bipolar disorder, unspecified: Secondary | ICD-10-CM

## 2012-12-25 MED ORDER — AMITRIPTYLINE HCL 10 MG PO TABS: 5.0000 mg | ORAL_TABLET | Freq: Every day | ORAL | Status: DC

## 2012-12-25 MED ORDER — DULOXETINE HCL 20 MG PO CPEP: 40.0000 mg | ORAL_CAPSULE | Freq: Two times a day (BID) | ORAL | Status: DC

## 2012-12-25 MED ORDER — GABAPENTIN 600 MG PO TABS: 600.0000 mg | ORAL_TABLET | Freq: Four times a day (QID) | ORAL | Status: DC

## 2012-12-25 NOTE — Patient Instructions (Addendum)
Ask Dr Margo Aye to add Vitamin D level to the blood work he is ordering.  CUT BACK/CUT OUT on sugar and carbohydrates, that means very limited fruits and starchy vegetables and very limited grains, breads  The goal is low GLYCEMIC INDEX.  CUT OUT all wheat, rye, or barley for the GLUTEN in them.  HIGH fat and LOW carbohydrate diet is the KEY.  Eat avocados, eggs, lean meat like grass fed beef and chicken  Nuts and seeds would be good foods as well.   Stevia is an excellent sweetener.  Safe for the brain.   Almond butter is awesome.  Check out all this on the Internet.  Tilden Fossa is an excellent resource for weight reduction.  She is a Publishing rights manager who has practiced for 15 years in the field of weight management.  Her phone number is [phone].    [employer]Parking[employer]fi is a resource for the brain effect of sugar.  Yoga is a very helpful exercise method.  On TV or on line [employer] is a source of high quality information about yoga and videos on yoga.  Renee Ramus is the world's number one video yoga instructor according to some experts.  There are exceptional health benefits that can be achieved through yoga.  The main principles of yoga is acceptance, no competition, no comparison, and no judgement.  It is exceptional in helping people meditate and get to a very relaxed state.   .Strongly consider attending at least 6 Alanon Meetings to help you learn about how your helping others to the exclusion of helping yourself is actually hurting yourself and is actually an addiction to fixing others and that you need to work the 12 Step to Happiness through the [employer]. Al-Anon Family Groups could be helpful with how to deal with substance abusing family and friends. Or your own issues of being in victim role.  There are only 40 Alanon Family Group meetings a week here in [place].  Online are current listing of those meetings @ [email or web address]  There are [employer].  Search on line and there you can learn the format and can access the schedule for yourself.  Their number is [phone]  Relaxation is the ultimate solution for you.  You can seek it through tub baths, bubble baths, essential oils or incense, walking or chatting with friends, listening to soft music, watching a candle burn and just letting all thoughts go and appreciating the true essence of the Creator.   Native Wisdom for [employer] by Wyn Quaker Schafe may be an interesting resource for you.  Ask me for [employer] if Benadryl doesn't help with sleep  Call if problems or concerns.

## 2012-12-25 NOTE — Progress Notes (Addendum)
[facility] [id number] Progress Note Michelle Clements MRN: [id number] DOB: 05/13/1966 Age: 47 y.o.  Date: 12/25/2012 Start Time: 10:55 AM End Time: 11:30 AM  Chief Complaint: Chief Complaint  Patient presents with  . Depression  . Follow-up  . Medication Refill   S:  Depression 0/10 and Anxiety 0/10, where 1 is the best and 10 is the worst.  Her pain is 4/10 on the same scale, but she thinks it is the cold. Pt comes to appointment with her daughter.  Pt reports that she is compliant with the psychotropic medications with fair benefit and some side effects.  Constipation is the most prominent side effect, but clumsiness emerged when she stopped the Elavil for 4 days.    Diagnosis:   Axis I: Bipolar, mixed Axis II: No diagnosis Axis III:  Past Medical History  Diagnosis Date  . Other malaise and fatigue   . Bipolar affective disorder, manic   . Hip pain, left   . Hypertension   . Neck pain   . Tobacco abuse   . Palpitations   . Allergic rhinitis   . Fibromyalgia   . Constipation   . IBS (irritable bowel syndrome)   . Hyperlipidemia   . GERD (gastroesophageal reflux disease)   . Depression   . Asthma   . PTSD (post-traumatic stress disorder)   . 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 Sleep apnea   . 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   Axis IV: occupational problems Axis V: 51-60 moderate symptoms  ADL's:  Intact  Sleep: Fair, about 6 or 7 hours a night  Appetite:  Good  Mental Status Examination/Evaluation: Objective:  Appearance: Casual  Eye Contact::  Good  Speech:  Clear and Coherent  Volume:  Normal  Mood:  Anxious  Affect:  Appropriate  Thought Process:  Coherent  Orientation:  Full  Thought Content:  WDL  Suicidal Thoughts:  No  Homicidal Thoughts:  No  Memory:  Immediate;   Fair Recent;   Fair Remote;   Fair  Judgement:  Fair  Insight:  Fair  Psychomotor Activity:  Normal  Concentration:  Fair  Recall:  Fair    Akathisia:  No  Handed:  Right  AIMS (if indicated):     Assets:  Communication Skills Desire for Improvement Housing Social Support  Sleep:      Vital Signs:Wt 263 lb (119.296 kg)  LMP 12/05/2012  Current Medications: Current outpatient prescriptions:amitriptyline (ELAVIL) 25 MG tablet, Take 1 tablet (25 mg total) by mouth at bedtime., Disp: 30 tablet, Rfl: 2;  DULoxetine (CYMBALTA) 60 MG capsule, Take 1 capsule (60 mg total) by mouth daily., Disp: 30 capsule, Rfl: 2;  gabapentin (NEURONTIN) 600 MG tablet, Take 1 tablet (600 mg total) by mouth 4 (four) times daily., Disp: 120 tablet, Rfl: 2 albuterol (ACCUNEB) 0.63 MG/3ML nebulizer solution, Take 3 mLs (0.63 mg total) by nebulization every 6 (six) hours as needed for wheezing or shortness of breath., Disp: 75 mL, Rfl: 0;  albuterol (PROAIR HFA) 108 (90 BASE) MCG/ACT inhaler, Inhale 2 puffs into the lungs every 6 (six) hours as needed., Disp: 1 Inhaler, Rfl: 0;  cetirizine (ZYRTEC) 10 MG tablet, Take 1 tablet (10 mg total) by mouth daily., Disp: 30 tablet, Rfl: 0 cyclobenzaprine (FLEXERIL) 10 MG tablet, Take 1 tablet (10 mg total) by mouth every 6 (six) hours as needed., Disp: 60 tablet, Rfl: 2;  lithium carbonate 300 MG capsule, Take 1 capsule (300 mg total) by  mouth at bedtime., Disp: 30 capsule, Rfl: 0;  metFORMIN (GLUCOPHAGE) 500 MG tablet, Take 1 tablet (500 mg total) by mouth daily with breakfast., Disp: 30 tablet, Rfl: 0 montelukast (SINGULAIR) 10 MG tablet, Take 1 tablet (10 mg total) by mouth daily., Disp: 30 tablet, Rfl: 0;  NON FORMULARY, CPAP MASK, FACIAL CUSHION AND TUBING. , Disp: , Rfl: ;  omeprazole (PRILOSEC) 20 MG capsule, Take 1 capsule (20 mg total) by mouth daily., Disp: 30 capsule, Rfl: 0;  polyethylene glycol powder (GLYCOLAX/MIRALAX) powder, Take 17 g by mouth daily as needed., Disp: 255 g, Rfl: 0 pravastatin (PRAVACHOL) 20 MG tablet, Take 1 tablet (20 mg total) by mouth daily., Disp: 30 tablet, Rfl: 0;  [DISCONTINUED]  buPROPion (WELLBUTRIN SR) 150 MG 12 hr tablet, Take 1 tablet (150 mg total) by mouth 2 (two) times daily., Disp: 60 tablet, Rfl: 1   Lab Results:  Recent Results (from the past 8736 hour(s))  LITHIUM LEVEL   Collection Time   02/21/12 11:53 AM      Component Value Range   Lithium Lvl 0.20 (*) 0.80 - 1.40 mEq/L  CBC WITH DIFFERENTIAL   Collection Time   02/21/12 11:53 AM      Component Value Range   WBC 8.4  4.0 - 10.5 K/uL   RBC 4.17  3.87 - 5.11 MIL/uL   Hemoglobin 13.0  12.0 - 15.0 g/dL   HCT 16.1  09.6 - 04.5 %   MCV 92.3  78.0 - 100.0 fL   MCH 31.2  26.0 - 34.0 pg   MCHC 33.8  30.0 - 36.0 g/dL   RDW 40.9  81.1 - 91.4 %   Platelets 314  150 - 400 K/uL   Neutrophils Relative 61  43 - 77 %   Neutro Abs 5.1  1.7 - 7.7 K/uL   Lymphocytes Relative 28  12 - 46 %   Lymphs Abs 2.3  0.7 - 4.0 K/uL   Monocytes Relative 5  3 - 12 %   Monocytes Absolute 0.4  0.1 - 1.0 K/uL   Eosinophils Relative 6 (*) 0 - 5 %   Eosinophils Absolute 0.5  0.0 - 0.7 K/uL   Basophils Relative 1  0 - 1 %   Basophils Absolute 0.1  0.0 - 0.1 K/uL   Smear Review Criteria for review not met    COMPREHENSIVE METABOLIC PANEL   Collection Time   02/21/12 11:53 AM      Component Value Range   Sodium 140  135 - 145 mEq/L   Potassium 4.3  3.5 - 5.3 mEq/L   Chloride 104  96 - 112 mEq/L   CO2 26  19 - 32 mEq/L   Glucose, Bld 134 (*) 70 - 99 mg/dL   BUN 9  6 - 23 mg/dL   Creat 7.82  9.56 - 2.13 mg/dL   Total Bilirubin 0.4  0.3 - 1.2 mg/dL   Alkaline Phosphatase 48  39 - 117 U/L   AST 43 (*) 0 - 37 U/L   ALT 50 (*) 0 - 35 U/L   Total Protein 6.3  6.0 - 8.3 g/dL   Albumin 4.6  3.5 - 5.2 g/dL   Calcium 9.9  8.4 - 08.6 mg/dL  TSH   Collection Time   02/21/12 11:53 AM      Component Value Range   TSH 2.651  0.350 - 4.500 uIU/mL  DRUGS OF ABUSE SCREEN W ALC, ROUTINE URINE   Collection Time   10/13/12  6:21 PM      Component Value Range   Amphetamine Screen, Ur NEGATIVE  Negative   Marijuana Metabolite  NEGATIVE  Negative   Barbiturate Quant, Ur NEGATIVE  Negative   Methadone NEGATIVE  Negative   Propoxyphene NEGATIVE  Negative   Benzodiazepines. NEGATIVE  Negative   Phencyclidine (PCP) NEGATIVE  Negative   Cocaine Metabolites NEGATIVE  Negative   Opiate Screen, Urine POSITIVE (*) Negative   Ethyl Alcohol <10  <10 mg/dL   Creatinine,U 782.2    URINALYSIS, ROUTINE W REFLEX MICROSCOPIC   Collection Time   10/13/12  6:21 PM      Component Value Range   Color, Urine YELLOW  YELLOW   APPearance CLEAR  CLEAR   Specific Gravity, Urine 1.018  1.005 - 1.030   pH 6.5  5.0 - 8.0   Glucose, UA NEGATIVE  NEGATIVE mg/dL   Hgb urine dipstick MODERATE (*) NEGATIVE   Bilirubin Urine NEGATIVE  NEGATIVE   Ketones, ur NEGATIVE  NEGATIVE mg/dL   Protein, ur NEGATIVE  NEGATIVE mg/dL   Urobilinogen, UA 1.0  0.0 - 1.0 mg/dL   Nitrite NEGATIVE  NEGATIVE   Leukocytes, UA TRACE (*) NEGATIVE  URINE RAPID DRUG SCREEN (HOSP PERFORMED)   Collection Time   10/13/12  6:21 PM      Component Value Range   Opiates POSITIVE (*) NONE DETECTED   Cocaine NONE DETECTED  NONE DETECTED   Benzodiazepines NONE DETECTED  NONE DETECTED   Amphetamines NONE DETECTED  NONE DETECTED   Tetrahydrocannabinol NONE DETECTED  NONE DETECTED   Barbiturates NONE DETECTED  NONE DETECTED  URINE MICROSCOPIC-ADD ON   Collection Time   10/13/12  6:21 PM      Component Value Range   Squamous Epithelial / LPF RARE  RARE   WBC, UA 0-2  <3 WBC/hpf  OPIATE, QUANTITATIVE, URINE   Collection Time   10/13/12  6:21 PM      Component Value Range   Morphine, Confirm NEGATIVE  Cutoff:50 ng/mL   Codeine Urine NEGATIVE  Cutoff:50 ng/mL   Hydromorphone GC/MS Conf 69  Cutoff:50 ng/mL   Hydrocodone 126  Cutoff:50 ng/mL   Norhydrocodone, Ur 421  Cutoff:50 ng/mL   Oxycodone, ur NEGATIVE  Cutoff:50 ng/mL   Noroxycodone, Ur NEGATIVE  Cutoff:50 ng/mL   6 Monoacetylmorphine, Ur-Confirm NEGATIVE  Cutoff:5 ng/mL   Oxymorphone NEGATIVE  Cutoff:50 ng/mL    COMPREHENSIVE METABOLIC PANEL   Collection Time   10/13/12  7:28 PM      Component Value Range   Sodium 140  135 - 145 mEq/L   Potassium 4.0  3.5 - 5.1 mEq/L   Chloride 101  96 - 112 mEq/L   CO2 28  19 - 32 mEq/L   Glucose, Bld 152 (*) 70 - 99 mg/dL   BUN 10  6 - 23 mg/dL   Creatinine, Ser 9.56  0.50 - 1.10 mg/dL   Calcium 21.3  8.4 - 08.6 mg/dL   Total Protein 6.9  6.0 - 8.3 g/dL   Albumin 4.4  3.5 - 5.2 g/dL   AST 36  0 - 37 U/L   ALT 38 (*) 0 - 35 U/L   Alkaline Phosphatase 66  39 - 117 U/L   Total Bilirubin 0.3  0.3 - 1.2 mg/dL   GFR calc non Af Amer 74 (*) >90 mL/min   GFR calc Af Amer 85 (*) >90 mL/min  CBC WITH DIFFERENTIAL   Collection Time   10/13/12  7:28 PM  Component Value Range   WBC 11.0 (*) 4.0 - 10.5 K/uL   RBC 4.32  3.87 - 5.11 MIL/uL   Hemoglobin 11.5 (*) 12.0 - 15.0 g/dL   HCT 29.5 (*) 62.1 - 30.8 %   MCV 80.8  78.0 - 100.0 fL   MCH 26.6  26.0 - 34.0 pg   MCHC 33.0  30.0 - 36.0 g/dL   RDW 65.7  84.6 - 96.2 %   Platelets 386  150 - 400 K/uL   Neutrophils Relative 64  43 - 77 %   Neutro Abs 7.0  1.7 - 7.7 K/uL   Lymphocytes Relative 27  12 - 46 %   Lymphs Abs 2.9  0.7 - 4.0 K/uL   Monocytes Relative 5  3 - 12 %   Monocytes Absolute 0.6  0.1 - 1.0 K/uL   Eosinophils Relative 4  0 - 5 %   Eosinophils Absolute 0.4  0.0 - 0.7 K/uL   Basophils Relative 1  0 - 1 %   Basophils Absolute 0.1  0.0 - 0.1 K/uL  GLUCOSE, CAPILLARY   Collection Time   10/13/12  8:59 PM      Component Value Range   Glucose-Capillary 122 (*) 70 - 99 mg/dL  GLUCOSE, CAPILLARY   Collection Time   10/14/12  6:33 AM      Component Value Range   Glucose-Capillary 135 (*) 70 - 99 mg/dL  COMPREHENSIVE METABOLIC PANEL   Collection Time   10/14/12  6:45 AM      Component Value Range   Sodium 136  135 - 145 mEq/L   Potassium 4.1  3.5 - 5.1 mEq/L   Chloride 100  96 - 112 mEq/L   CO2 28  19 - 32 mEq/L   Glucose, Bld 132 (*) 70 - 99 mg/dL   BUN 12  6 - 23 mg/dL   Creatinine, Ser  9.52  0.50 - 1.10 mg/dL   Calcium 9.8  8.4 - 84.1 mg/dL   Total Protein 6.8  6.0 - 8.3 g/dL   Albumin 4.2  3.5 - 5.2 g/dL   AST 37  0 - 37 U/L   ALT 39 (*) 0 - 35 U/L   Alkaline Phosphatase 65  39 - 117 U/L   Total Bilirubin 0.5  0.3 - 1.2 mg/dL   GFR calc non Af Amer 76 (*) >90 mL/min   GFR calc Af Amer 88 (*) >90 mL/min  CBC   Collection Time   10/14/12  6:45 AM      Component Value Range   WBC 8.9  4.0 - 10.5 K/uL   RBC 4.25  3.87 - 5.11 MIL/uL   Hemoglobin 11.4 (*) 12.0 - 15.0 g/dL   HCT 32.4 (*) 40.1 - 02.7 %   MCV 80.9  78.0 - 100.0 fL   MCH 26.8  26.0 - 34.0 pg   MCHC 33.1  30.0 - 36.0 g/dL   RDW 25.3  66.4 - 40.3 %   Platelets 334  150 - 400 K/uL  TSH   Collection Time   10/14/12  6:45 AM      Component Value Range   TSH 3.261  0.350 - 4.500 uIU/mL  VITAMIN D 1,25 DIHYDROXY   Collection Time   10/14/12  6:45 AM      Component Value Range   Vitamin D 1, 25 (OH) Total 38  18 - 72 pg/mL   Vitamin D3 1, 25 (OH) 38     Vitamin D2  1, 25 (OH) <8    VITAMIN D 25 HYDROXY   Collection Time   10/14/12  6:45 AM      Component Value Range   Vit D, 25-Hydroxy 39  30 - 89 ng/mL  BILIRUBIN, DIRECT   Collection Time   10/14/12  6:45 AM      Component Value Range   Bilirubin, Direct <0.1  0.0 - 0.3 mg/dL  GLUCOSE, CAPILLARY   Collection Time   10/14/12 11:48 AM      Component Value Range   Glucose-Capillary 115 (*) 70 - 99 mg/dL   Comment 1 Notify RN     Comment 2 Documented in Chart    GLUCOSE, CAPILLARY   Collection Time   10/14/12  8:51 PM      Component Value Range   Glucose-Capillary 149 (*) 70 - 99 mg/dL  GLUCOSE, CAPILLARY   Collection Time   10/15/12  5:42 AM      Component Value Range   Glucose-Capillary 132 (*) 70 - 99 mg/dL  GLUCOSE, CAPILLARY   Collection Time   10/15/12 12:00 PM      Component Value Range   Glucose-Capillary 81  70 - 99 mg/dL  GLUCOSE, CAPILLARY   Collection Time   10/15/12  4:50 PM      Component Value Range    Glucose-Capillary 124 (*) 70 - 99 mg/dL   Comment 1 Documented in Chart     Comment 2 Notify RN    GLUCOSE, CAPILLARY   Collection Time   10/15/12  8:57 PM      Component Value Range   Glucose-Capillary 134 (*) 70 - 99 mg/dL   Comment 1 Notify RN    GLUCOSE, CAPILLARY   Collection Time   10/16/12  6:19 AM      Component Value Range   Glucose-Capillary 118 (*) 70 - 99 mg/dL  GLUCOSE, CAPILLARY   Collection Time   10/16/12 11:54 AM      Component Value Range   Glucose-Capillary 105 (*) 70 - 99 mg/dL   Comment 1 Notify RN    GLUCOSE, CAPILLARY   Collection Time   10/16/12  5:04 PM      Component Value Range   Glucose-Capillary 110 (*) 70 - 99 mg/dL  GLUCOSE, CAPILLARY   Collection Time   10/16/12  8:39 PM      Component Value Range   Glucose-Capillary 157 (*) 70 - 99 mg/dL   Comment 1 Notify RN    GLUCOSE, CAPILLARY   Collection Time   10/17/12  6:13 AM      Component Value Range   Glucose-Capillary 134 (*) 70 - 99 mg/dL  GLUCOSE, CAPILLARY   Collection Time   10/17/12 11:59 AM      Component Value Range   Glucose-Capillary 76  70 - 99 mg/dL  GLUCOSE, CAPILLARY   Collection Time   10/17/12  5:00 PM      Component Value Range   Glucose-Capillary 102 (*) 70 - 99 mg/dL   Comment 1 Notify RN     Vitamin D level in typical range, but only at 39.  Some clinicians like it to be in the 59 and above range.  Will push Vit D replacement and see if the level can be increased to that range.   Physical Findings: AIMS:  , ,  ,  ,    CIWA:    COWS:     Treatment Plan Summary: Daily contact with patient to assess and  evaluate symptoms and progress in treatment Medication management  Plan/Discussion/Summary: I took her vitals.  I reviewed CC, tobacco/med/surg Hx, meds effects/ side effects, problem list, therapies and responses as well as current situation/symptoms discussed options. See orders and pt instructions for more details.  Orson Aloe, MD, [facility] Addendum:  12/26/2012 Cymbalta and Elavil resent to pharmacy as pharmacy maintains that they did ot receive the orders submitted yesterday. Orson Aloe, MD, [facility]

## 2012-12-26 MED ORDER — DULOXETINE HCL 20 MG PO CPEP: 40.0000 mg | ORAL_CAPSULE | Freq: Two times a day (BID) | ORAL | Status: DC

## 2012-12-26 MED ORDER — AMITRIPTYLINE HCL 10 MG PO TABS: ORAL_TABLET | ORAL | Status: DC

## 2012-12-26 MED ORDER — AMITRIPTYLINE HCL 10 MG PO TABS: 5.0000 mg | ORAL_TABLET | Freq: Every day | ORAL | Status: DC

## 2012-12-26 NOTE — Addendum Note (Signed)
Addended by: Mike Craze on: 12/26/2012 10:39 AM   Modules accepted: Orders

## 2012-12-26 NOTE — Addendum Note (Signed)
Addended by: Mike Craze on: 12/26/2012 12:33 PM   Modules accepted: Orders

## 2012-12-27 ENCOUNTER — Telehealth (HOSPITAL_COMMUNITY): Payer: Self-pay | Admitting: Psychiatry

## 2013-01-01 ENCOUNTER — Other Ambulatory Visit (HOSPITAL_COMMUNITY): Payer: Self-pay | Admitting: *Deleted

## 2013-01-01 DIAGNOSIS — F319 Bipolar disorder, unspecified: Secondary | ICD-10-CM

## 2013-01-01 MED ORDER — DULOXETINE HCL 20 MG PO CPEP: 40.0000 mg | ORAL_CAPSULE | Freq: Two times a day (BID) | ORAL | Status: DC

## 2013-01-01 NOTE — Telephone Encounter (Signed)
Per patient call on 1/23, Cymbalta did not go thru to pharmacy.Per EPIC, marked "no print" Resent today, "Normal". Patient notified.

## 2013-01-08 NOTE — Telephone Encounter (Signed)
Dois Davenport nurse from [place] covered this.

## 2013-01-10 ENCOUNTER — Other Ambulatory Visit (HOSPITAL_COMMUNITY): Payer: Self-pay | Admitting: Internal Medicine

## 2013-01-10 DIAGNOSIS — R319 Hematuria, unspecified: Secondary | ICD-10-CM

## 2013-01-11 ENCOUNTER — Ambulatory Visit (HOSPITAL_COMMUNITY)
Admission: RE | Admit: 2013-01-11 | Discharge: 2013-01-11 | Disposition: A | Discharge status: Home / Self Care | Payer: Medicare Other | Source: Ambulatory Visit | Attending: Internal Medicine | Admitting: Internal Medicine

## 2013-01-11 DIAGNOSIS — K7460 Unspecified cirrhosis of liver: Secondary | ICD-10-CM | POA: Insufficient documentation

## 2013-01-11 DIAGNOSIS — R319 Hematuria, unspecified: Secondary | ICD-10-CM

## 2013-01-11 DIAGNOSIS — R3129 Other microscopic hematuria: Secondary | ICD-10-CM | POA: Insufficient documentation

## 2013-01-19 ENCOUNTER — Other Ambulatory Visit: Payer: Self-pay

## 2013-02-05 ENCOUNTER — Ambulatory Visit (INDEPENDENT_AMBULATORY_CARE_PROVIDER_SITE_OTHER): Payer: No Typology Code available for payment source | Admitting: Psychiatry

## 2013-02-05 ENCOUNTER — Encounter (HOSPITAL_COMMUNITY): Payer: Self-pay | Admitting: Psychiatry

## 2013-02-05 VITALS — Wt 262.4 lb

## 2013-02-05 DIAGNOSIS — IMO0001 Reserved for inherently not codable concepts without codable children: Secondary | ICD-10-CM

## 2013-02-05 DIAGNOSIS — M549 Dorsalgia, unspecified: Secondary | ICD-10-CM

## 2013-02-05 DIAGNOSIS — F319 Bipolar disorder, unspecified: Secondary | ICD-10-CM

## 2013-02-05 DIAGNOSIS — F3160 Bipolar disorder, current episode mixed, unspecified: Secondary | ICD-10-CM

## 2013-02-05 MED ORDER — GABAPENTIN 600 MG PO TABS: 900.0000 mg | ORAL_TABLET | Freq: Four times a day (QID) | ORAL | Status: DC

## 2013-02-05 MED ORDER — CYCLOBENZAPRINE HCL 10 MG PO TABS: 10.0000 mg | ORAL_TABLET | Freq: Four times a day (QID) | ORAL | Status: DC | PRN

## 2013-02-05 NOTE — Patient Instructions (Addendum)
Look on line for a product called Eleviv.  Get the components of that from [facility] products and take them in the [facility].  Mangosteen juice which includes the rind three times a day along with SAMe 200 to 400 mg a night might be helpful for the mood control.  Could use "Move Free" or "Osteo bi Flex" for arthritic pain.   The important ingredients are Chondrotin Sulfate and Glucosamine.  Tumeric is also helpful for arthritis.   Krill oil and cod liver oil may be helpful for arthritis.   [employer] is a great source for all of these.  [phone]  Glori Luis is a mushroom that has the strongest antiinflammatory properties of any substance known to mankind.  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 These 3 things are the foundation of rehabilitating your brain. Staying off all abusable substances including nicotine, caffeine, and refined sugar and avoiding further head injuries are the other important elements in helping you keep your brain working the best it can for you. If memory is a problem then INSTEAD of the fish oil mentioned above, try using Brain Power Basics from [employer].  You can order online or by phone [phone]. It costs $99 for the first month, and $80 monthly thereafter, but that investment in your brain and the recovery of your brain proper functioning would seem worth it.  CUT BACK/CUT OUT on sugar and carbohydrates, that means very limited fruits and starchy vegetables and very limited grains, breads  The goal is low GLYCEMIC INDEX.  CUT OUT all wheat, rye, or barley for the GLUTEN in them.  HIGH fat and LOW carbohydrate diet is the KEY.  Eat avocados, eggs, lean meat like grass fed beef and chicken  Nuts and seeds would be good foods as well.    Stevia is an excellent sweetener.  Safe for the brain.   Lowella Grip is also a good safe sweetener.  Almond butter is awesome.  Check out all this on the Internet.  Set a timer for 8 minutes and walk for that amount of time in the house or in the yard.  Mark "8" on a calendar for that day.  Do that every day this week.  Then next week increase the time to 9 minutes and then mark the calendar with a 9 for that day.  Each week increase your exercise by one minute.  Keep a record of this so you can see what progress you are making.  Do this every day, just like eating and sleeping.  It is good for pain control, depression, and for your soul/spirit.  Bring the record in for your nest visit so we can talk about your effort and how you feel with the new exercise program going and working for you.  Call if problems or concerns.

## 2013-02-05 NOTE — Progress Notes (Signed)
[facility] [id number] Progress Note Michelle Clements MRN: [id number] DOB: 1966-09-07 Age: 47 y.o.  Date: 02/05/2013 Start Time: 11:52 AM End Time: 12:15 PM  Chief Complaint: Chief Complaint  Patient presents with  . Depression  . Follow-up  . Medication Refill   S:  Depression 0/10 and Anxiety 0/10, where 1 is the best and 10 is the worst.  Her pain is 8/10 on the same scale, but she is cutting back on the Cymbalta for cost reasons.  Pt comes to appointment with her daughter.  Pt reports that she is compliant with the psychotropic medications with good benefit and some side effects.  Constipation is now fixed.  She was anemic and get started on iron.    Diagnosis:   Axis I: Bipolar, mixed Axis II: No diagnosis Axis III:  Past Medical History  Diagnosis Date  . Other malaise and fatigue   . Bipolar affective disorder, manic   . Hip pain, left   . Hypertension   . Neck pain   . Tobacco abuse   . Palpitations   . Allergic rhinitis   . Fibromyalgia   . Constipation   . IBS (irritable bowel syndrome)   . Hyperlipidemia   . GERD (gastroesophageal reflux disease)   . Depression   . Asthma   . PTSD (post-traumatic stress disorder)   . 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 Sleep apnea   . 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   Axis IV: occupational problems Axis V: 51-60 moderate symptoms  ADL's:  Intact  Sleep: Good without meds, but gets to sleep about 3 AM.  Cautioned about this pattern with bipoalr in spring time.  Appetite:  Good  Mental Status Examination/Evaluation: Objective:  Appearance: Casual  Eye Contact::  Good  Speech:  Clear and Coherent  Volume:  Normal  Mood:  Anxious  Affect:  Appropriate  Thought Process:  Coherent  Orientation:  Full  Thought Content:  WDL  Suicidal Thoughts:  No  Homicidal Thoughts:  No  Memory:  Immediate;   Fair Recent;   Fair Remote;   Fair  Judgement:  Fair  Insight:  Fair  Psychomotor  Activity:  Normal  Concentration:  Fair  Recall:  Fair  Akathisia:  No  Handed:  Right  AIMS (if indicated):     Assets:  Communication Skills Desire for Improvement Housing Social Support  Sleep:      Vital Signs:Wt 262 lb 6.4 oz (119.024 kg)  BMI 36.33 kg/m2  LMP 01/25/2013  Current Medications: Current outpatient prescriptions:cyclobenzaprine (FLEXERIL) 10 MG tablet, Take 1 tablet (10 mg total) by mouth every 6 (six) hours as needed., Disp: 60 tablet, Rfl: 2;  DULoxetine (CYMBALTA) 20 MG capsule, Take 2 capsules (40 mg total) by mouth 2 (two) times daily., Disp: 120 capsule, Rfl: 2;  gabapentin (NEURONTIN) 600 MG tablet, Take 1 tablet (600 mg total) by mouth 4 (four) times daily., Disp: 120 tablet, Rfl: 2 albuterol (ACCUNEB) 0.63 MG/3ML nebulizer solution, Take 3 mLs (0.63 mg total) by nebulization every 6 (six) hours as needed for wheezing or shortness of breath., Disp: 75 mL, Rfl: 0;  albuterol (PROAIR HFA) 108 (90 BASE) MCG/ACT inhaler, Inhale 2 puffs into the lungs every 6 (six) hours as needed., Disp: 1 Inhaler, Rfl: 0 amitriptyline (ELAVIL) 10 MG tablet, 1 tab a day, then 1/2 tab a day, 3 days on each level and then stop., Disp: 20 tablet, Rfl: 0;  cetirizine (ZYRTEC) 10 MG tablet,  Take 1 tablet (10 mg total) by mouth daily., Disp: 30 tablet, Rfl: 0;  metFORMIN (GLUCOPHAGE) 500 MG tablet, Take 1 tablet (500 mg total) by mouth daily with breakfast., Disp: 30 tablet, Rfl: 0 montelukast (SINGULAIR) 10 MG tablet, Take 1 tablet (10 mg total) by mouth daily., Disp: 30 tablet, Rfl: 0;  NON FORMULARY, CPAP MASK, FACIAL CUSHION AND TUBING. , Disp: , Rfl: ;  omeprazole (PRILOSEC) 20 MG capsule, Take 1 capsule (20 mg total) by mouth daily., Disp: 30 capsule, Rfl: 0;  polyethylene glycol powder (GLYCOLAX/MIRALAX) powder, Take 17 g by mouth daily as needed., Disp: 255 g, Rfl: 0 pravastatin (PRAVACHOL) 20 MG tablet, Take 1 tablet (20 mg total) by mouth daily., Disp: 30 tablet, Rfl: 0;  [DISCONTINUED]  buPROPion (WELLBUTRIN SR) 150 MG 12 hr tablet, Take 1 tablet (150 mg total) by mouth 2 (two) times daily., Disp: 60 tablet, Rfl: 1   Lab Results:  Results for orders placed during the hospital encounter of 10/13/12 (from the past 8736 hour(s))  DRUGS OF ABUSE SCREEN W ALC, ROUTINE URINE   Collection Time    10/13/12  6:21 PM      Result Value Range   Amphetamine Screen, Ur NEGATIVE  Negative   Marijuana Metabolite NEGATIVE  Negative   Barbiturate Quant, Ur NEGATIVE  Negative   Methadone NEGATIVE  Negative   Propoxyphene NEGATIVE  Negative   Benzodiazepines. NEGATIVE  Negative   Phencyclidine (PCP) NEGATIVE  Negative   Cocaine Metabolites NEGATIVE  Negative   Opiate Screen, Urine POSITIVE (*) Negative   Ethyl Alcohol <10  <10 mg/dL   Creatinine,U 409.2    URINALYSIS, ROUTINE W REFLEX MICROSCOPIC   Collection Time    10/13/12  6:21 PM      Result Value Range   Color, Urine YELLOW  YELLOW   APPearance CLEAR  CLEAR   Specific Gravity, Urine 1.018  1.005 - 1.030   pH 6.5  5.0 - 8.0   Glucose, UA NEGATIVE  NEGATIVE mg/dL   Hgb urine dipstick MODERATE (*) NEGATIVE   Bilirubin Urine NEGATIVE  NEGATIVE   Ketones, ur NEGATIVE  NEGATIVE mg/dL   Protein, ur NEGATIVE  NEGATIVE mg/dL   Urobilinogen, UA 1.0  0.0 - 1.0 mg/dL   Nitrite NEGATIVE  NEGATIVE   Leukocytes, UA TRACE (*) NEGATIVE  URINE RAPID DRUG SCREEN (HOSP PERFORMED)   Collection Time    10/13/12  6:21 PM      Result Value Range   Opiates POSITIVE (*) NONE DETECTED   Cocaine NONE DETECTED  NONE DETECTED   Benzodiazepines NONE DETECTED  NONE DETECTED   Amphetamines NONE DETECTED  NONE DETECTED   Tetrahydrocannabinol NONE DETECTED  NONE DETECTED   Barbiturates NONE DETECTED  NONE DETECTED  URINE MICROSCOPIC-ADD ON   Collection Time    10/13/12  6:21 PM      Result Value Range   Squamous Epithelial / LPF RARE  RARE   WBC, UA 0-2  <3 WBC/hpf  OPIATE, QUANTITATIVE, URINE   Collection Time    10/13/12  6:21 PM       Result Value Range   Morphine, Confirm NEGATIVE  Cutoff:50 ng/mL   Codeine Urine NEGATIVE  Cutoff:50 ng/mL   Hydromorphone GC/MS Conf 69  Cutoff:50 ng/mL   Hydrocodone 126  Cutoff:50 ng/mL   Norhydrocodone, Ur 421  Cutoff:50 ng/mL   Oxycodone, ur NEGATIVE  Cutoff:50 ng/mL   Noroxycodone, Ur NEGATIVE  Cutoff:50 ng/mL   6 Monoacetylmorphine, Ur-Confirm NEGATIVE  Cutoff:5 ng/mL  Oxymorphone NEGATIVE  Cutoff:50 ng/mL  COMPREHENSIVE METABOLIC PANEL   Collection Time    10/13/12  7:28 PM      Result Value Range   Sodium 140  135 - 145 mEq/L   Potassium 4.0  3.5 - 5.1 mEq/L   Chloride 101  96 - 112 mEq/L   CO2 28  19 - 32 mEq/L   Glucose, Bld 152 (*) 70 - 99 mg/dL   BUN 10  6 - 23 mg/dL   Creatinine, Ser 9.56  0.50 - 1.10 mg/dL   Calcium 21.3  8.4 - 08.6 mg/dL   Total Protein 6.9  6.0 - 8.3 g/dL   Albumin 4.4  3.5 - 5.2 g/dL   AST 36  0 - 37 U/L   ALT 38 (*) 0 - 35 U/L   Alkaline Phosphatase 66  39 - 117 U/L   Total Bilirubin 0.3  0.3 - 1.2 mg/dL   GFR calc non Af Amer 74 (*) >90 mL/min   GFR calc Af Amer 85 (*) >90 mL/min  CBC WITH DIFFERENTIAL   Collection Time    10/13/12  7:28 PM      Result Value Range   WBC 11.0 (*) 4.0 - 10.5 K/uL   RBC 4.32  3.87 - 5.11 MIL/uL   Hemoglobin 11.5 (*) 12.0 - 15.0 g/dL   HCT 57.8 (*) 46.9 - 62.9 %   MCV 80.8  78.0 - 100.0 fL   MCH 26.6  26.0 - 34.0 pg   MCHC 33.0  30.0 - 36.0 g/dL   RDW 52.8  41.3 - 24.4 %   Platelets 386  150 - 400 K/uL   Neutrophils Relative 64  43 - 77 %   Neutro Abs 7.0  1.7 - 7.7 K/uL   Lymphocytes Relative 27  12 - 46 %   Lymphs Abs 2.9  0.7 - 4.0 K/uL   Monocytes Relative 5  3 - 12 %   Monocytes Absolute 0.6  0.1 - 1.0 K/uL   Eosinophils Relative 4  0 - 5 %   Eosinophils Absolute 0.4  0.0 - 0.7 K/uL   Basophils Relative 1  0 - 1 %   Basophils Absolute 0.1  0.0 - 0.1 K/uL  GLUCOSE, CAPILLARY   Collection Time    10/13/12  8:59 PM      Result Value Range   Glucose-Capillary 122 (*) 70 - 99 mg/dL  GLUCOSE,  CAPILLARY   Collection Time    10/14/12  6:33 AM      Result Value Range   Glucose-Capillary 135 (*) 70 - 99 mg/dL  COMPREHENSIVE METABOLIC PANEL   Collection Time    10/14/12  6:45 AM      Result Value Range   Sodium 136  135 - 145 mEq/L   Potassium 4.1  3.5 - 5.1 mEq/L   Chloride 100  96 - 112 mEq/L   CO2 28  19 - 32 mEq/L   Glucose, Bld 132 (*) 70 - 99 mg/dL   BUN 12  6 - 23 mg/dL   Creatinine, Ser 0.10  0.50 - 1.10 mg/dL   Calcium 9.8  8.4 - 27.2 mg/dL   Total Protein 6.8  6.0 - 8.3 g/dL   Albumin 4.2  3.5 - 5.2 g/dL   AST 37  0 - 37 U/L   ALT 39 (*) 0 - 35 U/L   Alkaline Phosphatase 65  39 - 117 U/L   Total Bilirubin 0.5  0.3 - 1.2  mg/dL   GFR calc non Af Amer 76 (*) >90 mL/min   GFR calc Af Amer 88 (*) >90 mL/min  CBC   Collection Time    10/14/12  6:45 AM      Result Value Range   WBC 8.9  4.0 - 10.5 K/uL   RBC 4.25  3.87 - 5.11 MIL/uL   Hemoglobin 11.4 (*) 12.0 - 15.0 g/dL   HCT 16.1 (*) 09.6 - 04.5 %   MCV 80.9  78.0 - 100.0 fL   MCH 26.8  26.0 - 34.0 pg   MCHC 33.1  30.0 - 36.0 g/dL   RDW 40.9  81.1 - 91.4 %   Platelets 334  150 - 400 K/uL  TSH   Collection Time    10/14/12  6:45 AM      Result Value Range   TSH 3.261  0.350 - 4.500 uIU/mL  VITAMIN D 1,25 DIHYDROXY   Collection Time    10/14/12  6:45 AM      Result Value Range   Vitamin D 1, 25 (OH) Total 38  18 - 72 pg/mL   Vitamin D3 1, 25 (OH) 38     Vitamin D2 1, 25 (OH) <8    VITAMIN D 25 HYDROXY   Collection Time    10/14/12  6:45 AM      Result Value Range   Vit D, 25-Hydroxy 39  30 - 89 ng/mL  BILIRUBIN, DIRECT   Collection Time    10/14/12  6:45 AM      Result Value Range   Bilirubin, Direct <0.1  0.0 - 0.3 mg/dL  GLUCOSE, CAPILLARY   Collection Time    10/14/12 11:48 AM      Result Value Range   Glucose-Capillary 115 (*) 70 - 99 mg/dL   Comment 1 Notify RN     Comment 2 Documented in Chart    GLUCOSE, CAPILLARY   Collection Time    10/14/12  8:51 PM      Result Value Range    Glucose-Capillary 149 (*) 70 - 99 mg/dL  GLUCOSE, CAPILLARY   Collection Time    10/15/12  5:42 AM      Result Value Range   Glucose-Capillary 132 (*) 70 - 99 mg/dL  GLUCOSE, CAPILLARY   Collection Time    10/15/12 12:00 PM      Result Value Range   Glucose-Capillary 81  70 - 99 mg/dL  GLUCOSE, CAPILLARY   Collection Time    10/15/12  4:50 PM      Result Value Range   Glucose-Capillary 124 (*) 70 - 99 mg/dL   Comment 1 Documented in Chart     Comment 2 Notify RN    GLUCOSE, CAPILLARY   Collection Time    10/15/12  8:57 PM      Result Value Range   Glucose-Capillary 134 (*) 70 - 99 mg/dL   Comment 1 Notify RN    GLUCOSE, CAPILLARY   Collection Time    10/16/12  6:19 AM      Result Value Range   Glucose-Capillary 118 (*) 70 - 99 mg/dL  GLUCOSE, CAPILLARY   Collection Time    10/16/12 11:54 AM      Result Value Range   Glucose-Capillary 105 (*) 70 - 99 mg/dL   Comment 1 Notify RN    GLUCOSE, CAPILLARY   Collection Time    10/16/12  5:04 PM      Result Value Range   Glucose-Capillary  110 (*) 70 - 99 mg/dL  GLUCOSE, CAPILLARY   Collection Time    10/16/12  8:39 PM      Result Value Range   Glucose-Capillary 157 (*) 70 - 99 mg/dL   Comment 1 Notify RN    GLUCOSE, CAPILLARY   Collection Time    10/17/12  6:13 AM      Result Value Range   Glucose-Capillary 134 (*) 70 - 99 mg/dL  GLUCOSE, CAPILLARY   Collection Time    10/17/12 11:59 AM      Result Value Range   Glucose-Capillary 76  70 - 99 mg/dL  GLUCOSE, CAPILLARY   Collection Time    10/17/12  5:00 PM      Result Value Range   Glucose-Capillary 102 (*) 70 - 99 mg/dL   Comment 1 Notify RN    Results for orders placed in visit on 02/21/12 (from the past 8736 hour(s))  LITHIUM LEVEL   Collection Time    02/21/12 11:53 AM      Result Value Range   Lithium Lvl 0.20 (*) 0.80 - 1.40 mEq/L  CBC WITH DIFFERENTIAL   Collection Time    02/21/12 11:53 AM      Result Value Range   WBC 8.4  4.0 - 10.5 K/uL   RBC  4.17  3.87 - 5.11 MIL/uL   Hemoglobin 13.0  12.0 - 15.0 g/dL   HCT 40.9  81.1 - 91.4 %   MCV 92.3  78.0 - 100.0 fL   MCH 31.2  26.0 - 34.0 pg   MCHC 33.8  30.0 - 36.0 g/dL   RDW 78.2  95.6 - 21.3 %   Platelets 314  150 - 400 K/uL   Neutrophils Relative 61  43 - 77 %   Neutro Abs 5.1  1.7 - 7.7 K/uL   Lymphocytes Relative 28  12 - 46 %   Lymphs Abs 2.3  0.7 - 4.0 K/uL   Monocytes Relative 5  3 - 12 %   Monocytes Absolute 0.4  0.1 - 1.0 K/uL   Eosinophils Relative 6 (*) 0 - 5 %   Eosinophils Absolute 0.5  0.0 - 0.7 K/uL   Basophils Relative 1  0 - 1 %   Basophils Absolute 0.1  0.0 - 0.1 K/uL   Smear Review Criteria for review not met    COMPREHENSIVE METABOLIC PANEL   Collection Time    02/21/12 11:53 AM      Result Value Range   Sodium 140  135 - 145 mEq/L   Potassium 4.3  3.5 - 5.3 mEq/L   Chloride 104  96 - 112 mEq/L   CO2 26  19 - 32 mEq/L   Glucose, Bld 134 (*) 70 - 99 mg/dL   BUN 9  6 - 23 mg/dL   Creat 0.86  5.78 - 4.69 mg/dL   Total Bilirubin 0.4  0.3 - 1.2 mg/dL   Alkaline Phosphatase 48  39 - 117 U/L   AST 43 (*) 0 - 37 U/L   ALT 50 (*) 0 - 35 U/L   Total Protein 6.3  6.0 - 8.3 g/dL   Albumin 4.6  3.5 - 5.2 g/dL   Calcium 9.9  8.4 - 62.9 mg/dL  TSH   Collection Time    02/21/12 11:53 AM      Result Value Range   TSH 2.651  0.350 - 4.500 uIU/mL   Vitamin D level in typical range, but only at 39.  Some  clinicians like it to be in the 59 and above range.  She is on 5,00 once a day Vit D replacement and see if the level can be increased to that range.   Physical Findings: AIMS:  , ,  ,  ,    CIWA:    COWS:     Treatment Plan Summary: Daily contact with patient to assess and evaluate symptoms and progress in treatment Medication management  Plan/Discussion: I took her vitals.  I reviewed CC, tobacco/med/surg Hx, meds effects/ side effects, problem list, therapies and responses as well as current situation/symptoms discussed options. Keep cutting back on  Cymbalta for cost reasons and try increas of Neurontin for the pain management.  Be care ful with lossing sleep in this time of high risk mood disruption in the spring for bipolar disorder.  See orders and pt instructions for more details.  Medical Decision Making Problem Points:  Established problem, stable/improving (1), New problem, with no additional work-up planned (3), Review of last therapy session (1) and Review of psycho-social stressors (1) Data Points:  Review or order clinical lab tests (1) Review of medication regiment & side effects (2) Review of new medications or change in dosage (2)  I certify that outpatient services furnished can reasonably be expected to improve the patient's condition.   Orson Aloe, MD, [facility]

## 2013-02-05 NOTE — Addendum Note (Signed)
Addended by: Mike Craze on: 02/05/2013 12:21 PM   Modules accepted: Orders

## 2013-02-15 ENCOUNTER — Telehealth (HOSPITAL_COMMUNITY): Payer: Self-pay | Admitting: Psychiatry

## 2013-02-15 DIAGNOSIS — F329 Major depressive disorder, single episode, unspecified: Secondary | ICD-10-CM

## 2013-02-15 MED ORDER — AMITRIPTYLINE HCL 25 MG PO TABS: 25.0000 mg | ORAL_TABLET | Freq: Every day | ORAL | Status: DC

## 2013-02-15 NOTE — Telephone Encounter (Signed)
Headaches since stopping the Cymbalta.  It still costs $617 per month.  Will add back Elavil and could take 50 mg 5HTP to see if that helps.

## 2013-03-26 ENCOUNTER — Encounter (HOSPITAL_COMMUNITY): Payer: Self-pay | Admitting: Psychiatry

## 2013-03-26 ENCOUNTER — Ambulatory Visit (INDEPENDENT_AMBULATORY_CARE_PROVIDER_SITE_OTHER): Payer: No Typology Code available for payment source | Admitting: Psychiatry

## 2013-03-26 VITALS — Wt 263.6 lb

## 2013-03-26 DIAGNOSIS — F319 Bipolar disorder, unspecified: Secondary | ICD-10-CM

## 2013-03-26 DIAGNOSIS — Z87891 Personal history of nicotine dependence: Secondary | ICD-10-CM

## 2013-03-26 DIAGNOSIS — F3160 Bipolar disorder, current episode mixed, unspecified: Secondary | ICD-10-CM

## 2013-03-26 DIAGNOSIS — E559 Vitamin D deficiency, unspecified: Secondary | ICD-10-CM

## 2013-03-26 DIAGNOSIS — F329 Major depressive disorder, single episode, unspecified: Secondary | ICD-10-CM

## 2013-03-26 DIAGNOSIS — IMO0001 Reserved for inherently not codable concepts without codable children: Secondary | ICD-10-CM

## 2013-03-26 MED ORDER — HYDROXYZINE HCL 50 MG PO TABS: 50.0000 mg | ORAL_TABLET | Freq: Three times a day (TID) | ORAL | Status: DC | PRN

## 2013-03-26 MED ORDER — GABAPENTIN 600 MG PO TABS: 900.0000 mg | ORAL_TABLET | Freq: Four times a day (QID) | ORAL | Status: DC

## 2013-03-26 MED ORDER — CYCLOBENZAPRINE HCL 10 MG PO TABS: 10.0000 mg | ORAL_TABLET | Freq: Four times a day (QID) | ORAL | Status: DC | PRN

## 2013-03-26 NOTE — Progress Notes (Signed)
[facility] [id number] Progress Note Michelle Clements MRN: [id number] DOB: Nov 01, 1966 Age: 47 y.o.  Date: 03/26/2013 Start Time: 9:50 A AM End Time: 10:45 AM  Chief Complaint: Chief Complaint  Patient presents with  . Depression  . Follow-up  . Medication Refill   S:  "I'm having all sorts of trouble with pain depression anxiety and episodes of real frustration". Depression 7/10 and Anxiety 8/10, where 1 is the best and 10 is the worst.  Her pain is 8/10 on the same scale.  Her depression and anxiety is much worse since last time since she has cutting back on the Cymbalta for cost reasons.  Pt comes to appointment with her daughter.  Pt reports that she is compliant with the psychotropic medications with fair benefit and some side effects.    Pt is now sensing feelings of abandonment from a long series of losses over her life.  Discussed her getting into some [employer] or [employer] support for her abandonment issues.   Diagnosis:   Axis I: Bipolar, mixed Axis II: No diagnosis Axis III:  Past Medical History  Diagnosis Date  . Other malaise and fatigue   . Bipolar affective disorder, manic   . Hip pain, left   . Hypertension   . Neck pain   . Tobacco abuse   . Palpitations   . Allergic rhinitis   . Fibromyalgia   . Constipation   . IBS (irritable bowel syndrome)   . Hyperlipidemia   . GERD (gastroesophageal reflux disease)   . Depression   . Asthma   . PTSD (post-traumatic stress disorder)   . 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 Sleep apnea   . 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   Axis IV: occupational problems Axis V: 51-60 moderate symptoms  ADL's:  Intact  Sleep: Good without meds, but gets to sleep about 3 AM.  Cautioned about this pattern with bipoalr in spring time.  Appetite:  Good  Mental Status Examination/Evaluation: Objective:  Appearance: Casual  Eye Contact::  Good  Speech:  Clear and Coherent  Volume:  Normal  Mood:   Anxious  Affect:  Appropriate  Thought Process:  Coherent  Orientation:  Full  Thought Content:  WDL  Suicidal Thoughts:  No  Homicidal Thoughts:  No  Memory:  Immediate;   Fair Recent;   Fair Remote;   Fair  Judgement:  Fair  Insight:  Fair  Psychomotor Activity:  Normal  Concentration:  Fair  Recall:  Fair  Akathisia:  No  Handed:  Right  AIMS (if indicated):     Assets:  Communication Skills Desire for Improvement Housing Social Support  Sleep:      Vital Signs:Wt 263 lb 9.6 oz (119.568 kg)  BMI 36.5 kg/m2  LMP 03/22/2013  Current Medications: Current outpatient prescriptions:amitriptyline (ELAVIL) 25 MG tablet, Take 1 tablet (25 mg total) by mouth at bedtime., Disp: 30 tablet, Rfl: 1;  gabapentin (NEURONTIN) 600 MG tablet, Take 1.5 tablets (900 mg total) by mouth 4 (four) times daily., Disp: 180 tablet, Rfl: 2 albuterol (ACCUNEB) 0.63 MG/3ML nebulizer solution, Take 3 mLs (0.63 mg total) by nebulization every 6 (six) hours as needed for wheezing or shortness of breath., Disp: 75 mL, Rfl: 0;  albuterol (PROAIR HFA) 108 (90 BASE) MCG/ACT inhaler, Inhale 2 puffs into the lungs every 6 (six) hours as needed., Disp: 1 Inhaler, Rfl: 0;  cetirizine (ZYRTEC) 10 MG tablet, Take 1 tablet (10 mg total) by mouth daily.,  Disp: 30 tablet, Rfl: 0 cyclobenzaprine (FLEXERIL) 10 MG tablet, Take 1 tablet (10 mg total) by mouth every 6 (six) hours as needed., Disp: 60 tablet, Rfl: 2;  metFORMIN (GLUCOPHAGE) 500 MG tablet, Take 1 tablet (500 mg total) by mouth daily with breakfast., Disp: 30 tablet, Rfl: 0;  montelukast (SINGULAIR) 10 MG tablet, Take 1 tablet (10 mg total) by mouth daily., Disp: 30 tablet, Rfl: 0;  NON FORMULARY, CPAP MASK, FACIAL CUSHION AND TUBING. , Disp: , Rfl:  omeprazole (PRILOSEC) 20 MG capsule, Take 1 capsule (20 mg total) by mouth daily., Disp: 30 capsule, Rfl: 0;  polyethylene glycol powder (GLYCOLAX/MIRALAX) powder, Take 17 g by mouth daily as needed., Disp: 255 g, Rfl: 0;   pravastatin (PRAVACHOL) 20 MG tablet, Take 1 tablet (20 mg total) by mouth daily., Disp: 30 tablet, Rfl: 0 [DISCONTINUED] buPROPion (WELLBUTRIN SR) 150 MG 12 hr tablet, Take 1 tablet (150 mg total) by mouth 2 (two) times daily., Disp: 60 tablet, Rfl: 1   Lab Results:  Results for orders placed during the hospital encounter of 10/13/12 (from the past 8736 hour(s))  DRUGS OF ABUSE SCREEN W ALC, ROUTINE URINE   Collection Time    10/13/12  6:21 PM      Result Value Range   Amphetamine Screen, Ur NEGATIVE  Negative   Marijuana Metabolite NEGATIVE  Negative   Barbiturate Quant, Ur NEGATIVE  Negative   Methadone NEGATIVE  Negative   Propoxyphene NEGATIVE  Negative   Benzodiazepines. NEGATIVE  Negative   Phencyclidine (PCP) NEGATIVE  Negative   Cocaine Metabolites NEGATIVE  Negative   Opiate Screen, Urine POSITIVE (*) Negative   Ethyl Alcohol <10  <10 mg/dL   Creatinine,U 191.2    URINALYSIS, ROUTINE W REFLEX MICROSCOPIC   Collection Time    10/13/12  6:21 PM      Result Value Range   Color, Urine YELLOW  YELLOW   APPearance CLEAR  CLEAR   Specific Gravity, Urine 1.018  1.005 - 1.030   pH 6.5  5.0 - 8.0   Glucose, UA NEGATIVE  NEGATIVE mg/dL   Hgb urine dipstick MODERATE (*) NEGATIVE   Bilirubin Urine NEGATIVE  NEGATIVE   Ketones, ur NEGATIVE  NEGATIVE mg/dL   Protein, ur NEGATIVE  NEGATIVE mg/dL   Urobilinogen, UA 1.0  0.0 - 1.0 mg/dL   Nitrite NEGATIVE  NEGATIVE   Leukocytes, UA TRACE (*) NEGATIVE  URINE RAPID DRUG SCREEN (HOSP PERFORMED)   Collection Time    10/13/12  6:21 PM      Result Value Range   Opiates POSITIVE (*) NONE DETECTED   Cocaine NONE DETECTED  NONE DETECTED   Benzodiazepines NONE DETECTED  NONE DETECTED   Amphetamines NONE DETECTED  NONE DETECTED   Tetrahydrocannabinol NONE DETECTED  NONE DETECTED   Barbiturates NONE DETECTED  NONE DETECTED  URINE MICROSCOPIC-ADD ON   Collection Time    10/13/12  6:21 PM      Result Value Range   Squamous Epithelial /  LPF RARE  RARE   WBC, UA 0-2  <3 WBC/hpf  OPIATE, QUANTITATIVE, URINE   Collection Time    10/13/12  6:21 PM      Result Value Range   Morphine, Confirm NEGATIVE  Cutoff:50 ng/mL   Codeine Urine NEGATIVE  Cutoff:50 ng/mL   Hydromorphone GC/MS Conf 69  Cutoff:50 ng/mL   Hydrocodone 126  Cutoff:50 ng/mL   Norhydrocodone, Ur 421  Cutoff:50 ng/mL   Oxycodone, ur NEGATIVE  Cutoff:50 ng/mL  Noroxycodone, Ur NEGATIVE  Cutoff:50 ng/mL   6 Monoacetylmorphine, Ur-Confirm NEGATIVE  Cutoff:5 ng/mL   Oxymorphone NEGATIVE  Cutoff:50 ng/mL  COMPREHENSIVE METABOLIC PANEL   Collection Time    10/13/12  7:28 PM      Result Value Range   Sodium 140  135 - 145 mEq/L   Potassium 4.0  3.5 - 5.1 mEq/L   Chloride 101  96 - 112 mEq/L   CO2 28  19 - 32 mEq/L   Glucose, Bld 152 (*) 70 - 99 mg/dL   BUN 10  6 - 23 mg/dL   Creatinine, Ser 1.61  0.50 - 1.10 mg/dL   Calcium 09.6  8.4 - 04.5 mg/dL   Total Protein 6.9  6.0 - 8.3 g/dL   Albumin 4.4  3.5 - 5.2 g/dL   AST 36  0 - 37 U/L   ALT 38 (*) 0 - 35 U/L   Alkaline Phosphatase 66  39 - 117 U/L   Total Bilirubin 0.3  0.3 - 1.2 mg/dL   GFR calc non Af Amer 74 (*) >90 mL/min   GFR calc Af Amer 85 (*) >90 mL/min  CBC WITH DIFFERENTIAL   Collection Time    10/13/12  7:28 PM      Result Value Range   WBC 11.0 (*) 4.0 - 10.5 K/uL   RBC 4.32  3.87 - 5.11 MIL/uL   Hemoglobin 11.5 (*) 12.0 - 15.0 g/dL   HCT 40.9 (*) 81.1 - 91.4 %   MCV 80.8  78.0 - 100.0 fL   MCH 26.6  26.0 - 34.0 pg   MCHC 33.0  30.0 - 36.0 g/dL   RDW 78.2  95.6 - 21.3 %   Platelets 386  150 - 400 K/uL   Neutrophils Relative 64  43 - 77 %   Neutro Abs 7.0  1.7 - 7.7 K/uL   Lymphocytes Relative 27  12 - 46 %   Lymphs Abs 2.9  0.7 - 4.0 K/uL   Monocytes Relative 5  3 - 12 %   Monocytes Absolute 0.6  0.1 - 1.0 K/uL   Eosinophils Relative 4  0 - 5 %   Eosinophils Absolute 0.4  0.0 - 0.7 K/uL   Basophils Relative 1  0 - 1 %   Basophils Absolute 0.1  0.0 - 0.1 K/uL  GLUCOSE, CAPILLARY    Collection Time    10/13/12  8:59 PM      Result Value Range   Glucose-Capillary 122 (*) 70 - 99 mg/dL  GLUCOSE, CAPILLARY   Collection Time    10/14/12  6:33 AM      Result Value Range   Glucose-Capillary 135 (*) 70 - 99 mg/dL  COMPREHENSIVE METABOLIC PANEL   Collection Time    10/14/12  6:45 AM      Result Value Range   Sodium 136  135 - 145 mEq/L   Potassium 4.1  3.5 - 5.1 mEq/L   Chloride 100  96 - 112 mEq/L   CO2 28  19 - 32 mEq/L   Glucose, Bld 132 (*) 70 - 99 mg/dL   BUN 12  6 - 23 mg/dL   Creatinine, Ser 0.86  0.50 - 1.10 mg/dL   Calcium 9.8  8.4 - 57.8 mg/dL   Total Protein 6.8  6.0 - 8.3 g/dL   Albumin 4.2  3.5 - 5.2 g/dL   AST 37  0 - 37 U/L   ALT 39 (*) 0 - 35 U/L  Alkaline Phosphatase 65  39 - 117 U/L   Total Bilirubin 0.5  0.3 - 1.2 mg/dL   GFR calc non Af Amer 76 (*) >90 mL/min   GFR calc Af Amer 88 (*) >90 mL/min  CBC   Collection Time    10/14/12  6:45 AM      Result Value Range   WBC 8.9  4.0 - 10.5 K/uL   RBC 4.25  3.87 - 5.11 MIL/uL   Hemoglobin 11.4 (*) 12.0 - 15.0 g/dL   HCT 40.9 (*) 81.1 - 91.4 %   MCV 80.9  78.0 - 100.0 fL   MCH 26.8  26.0 - 34.0 pg   MCHC 33.1  30.0 - 36.0 g/dL   RDW 78.2  95.6 - 21.3 %   Platelets 334  150 - 400 K/uL  TSH   Collection Time    10/14/12  6:45 AM      Result Value Range   TSH 3.261  0.350 - 4.500 uIU/mL  VITAMIN D 1,25 DIHYDROXY   Collection Time    10/14/12  6:45 AM      Result Value Range   Vitamin D 1, 25 (OH) Total 38  18 - 72 pg/mL   Vitamin D3 1, 25 (OH) 38     Vitamin D2 1, 25 (OH) <8    VITAMIN D 25 HYDROXY   Collection Time    10/14/12  6:45 AM      Result Value Range   Vit D, 25-Hydroxy 39  30 - 89 ng/mL  BILIRUBIN, DIRECT   Collection Time    10/14/12  6:45 AM      Result Value Range   Bilirubin, Direct <0.1  0.0 - 0.3 mg/dL  GLUCOSE, CAPILLARY   Collection Time    10/14/12 11:48 AM      Result Value Range   Glucose-Capillary 115 (*) 70 - 99 mg/dL   Comment 1 Notify RN      Comment 2 Documented in Chart    GLUCOSE, CAPILLARY   Collection Time    10/14/12  8:51 PM      Result Value Range   Glucose-Capillary 149 (*) 70 - 99 mg/dL  GLUCOSE, CAPILLARY   Collection Time    10/15/12  5:42 AM      Result Value Range   Glucose-Capillary 132 (*) 70 - 99 mg/dL  GLUCOSE, CAPILLARY   Collection Time    10/15/12 12:00 PM      Result Value Range   Glucose-Capillary 81  70 - 99 mg/dL  GLUCOSE, CAPILLARY   Collection Time    10/15/12  4:50 PM      Result Value Range   Glucose-Capillary 124 (*) 70 - 99 mg/dL   Comment 1 Documented in Chart     Comment 2 Notify RN    GLUCOSE, CAPILLARY   Collection Time    10/15/12  8:57 PM      Result Value Range   Glucose-Capillary 134 (*) 70 - 99 mg/dL   Comment 1 Notify RN    GLUCOSE, CAPILLARY   Collection Time    10/16/12  6:19 AM      Result Value Range   Glucose-Capillary 118 (*) 70 - 99 mg/dL  GLUCOSE, CAPILLARY   Collection Time    10/16/12 11:54 AM      Result Value Range   Glucose-Capillary 105 (*) 70 - 99 mg/dL   Comment 1 Notify RN    GLUCOSE, CAPILLARY   Collection Time  10/16/12  5:04 PM      Result Value Range   Glucose-Capillary 110 (*) 70 - 99 mg/dL  GLUCOSE, CAPILLARY   Collection Time    10/16/12  8:39 PM      Result Value Range   Glucose-Capillary 157 (*) 70 - 99 mg/dL   Comment 1 Notify RN    GLUCOSE, CAPILLARY   Collection Time    10/17/12  6:13 AM      Result Value Range   Glucose-Capillary 134 (*) 70 - 99 mg/dL  GLUCOSE, CAPILLARY   Collection Time    10/17/12 11:59 AM      Result Value Range   Glucose-Capillary 76  70 - 99 mg/dL  GLUCOSE, CAPILLARY   Collection Time    10/17/12  5:00 PM      Result Value Range   Glucose-Capillary 102 (*) 70 - 99 mg/dL   Comment 1 Notify RN     Vitamin D level in typical range, but only at 39.  Some clinicians like it to be in the 59 and above range.  She is on 5,00 once a day Vit D replacement and see if the level can be increased to that  range.   Physical Findings: AIMS:  , ,  ,  ,    CIWA:    COWS:     Treatment Plan Summary: Daily contact with patient to assess and evaluate symptoms and progress in treatment Medication management  Plan/Discussion: I took her vitals.  I reviewed CC, tobacco/med/surg Hx, meds effects/ side effects, problem list, therapies and responses as well as current situation/symptoms discussed options. Push Flexeril to help with neck pain.  Try and get back on Cymbalta.  Get some time for self each day.   See orders and pt instructions for more details.  MEDICATIONS this encounter: No orders of the defined types were placed in this encounter.    Medical Decision Making Problem Points:  Established problem, stable/improving (1), New problem, with no additional work-up planned (3), Review of last therapy session (1) and Review of psycho-social stressors (1) Data Points:  Review or order clinical lab tests (1) Review of medication regiment & side effects (2) Review of new medications or change in dosage (2)  I certify that outpatient services furnished can reasonably be expected to improve the patient's condition.   Orson Aloe, MD, [facility]

## 2013-03-26 NOTE — Patient Instructions (Addendum)
Relaxation is the ultimate solution for you.  You can seek it through tub baths, bubble baths, essential oils or incense, walking or chatting with friends, listening to soft music, watching a candle burn and just letting all thoughts go and appreciating the true essence of the Creator.  Pets or animals may be very helpful.  You might spend some time with them and then go do more directed meditation.  Yoga is a very helpful exercise method.  On TV, on line, or by DVD Adelfa Koh is a source of high quality information about yoga and videos on yoga.  Renee Ramus is the world's number one video yoga instructor according to some experts.  There are exceptional health benefits that can be achieved through yoga.  The main principles of yoga is acceptance, no competition, no comparison, and no judgement.  It is exceptional in helping people meditate and get to a very relaxed state.   Adult Children of Alcoholics has some amazing literature available on line.  The work book is Tree surgeon.  Their web site is full of other related information.  Strongly consider attending at least 6 [employer] Meetings to help you learn about how your helping others to the exclusion of helping yourself is actually hurting yourself and is actually an addiction to fixing others and that you need to work the 12 Step to Happiness through the [employer]. [employer]-Anon Family Groups could be helpful with how to deal with substance abusing family and friends. Or your own issues of being in victim role.  There are only 40 Alanon Family Group meetings a week here in [place].  Online are current listing of those meetings @ [email or web address]  There are [employer].  Search on line and there you can learn the format and can access the schedule for yourself.  Their number is [phone]  Call [employer] or go on line about their [employer] program for the generic Cymbalta.  Call me if you are unable to get any  where.  Consider going to Dr Laurian Brim in [place] for the pain management if the other Dr does not help that much.  Set a timer for 8 or a certain number minutes and walk or check out [employer] line for that amount of time in the house or in the yard.  Mark the number of minutes on a calendar for that day.  Do that every day this week.  Then next week increase the time by 1 minutes and then mark the calendar with the number of minutes for that day.  Each week increase your exercise by one minute.  Keep a record of this so you can see the progress you are making.  Do this every day, just like eating and sleeping.  It is good for pain control, depression, and for your soul/spirit.  Bring the record in for your next visit so we can talk about your effort and how you feel with the new exercise program going and working for you.  Take care of yourself.  No one else is standing up to do the job and only you know what you need.   GET SERIOUS about taking care of yourself.  Do the next right thing and that often means doing something to care for yourself along the lines of are you hungry, are you angry, are you lonely, are you tired, are you scared?  [employer] is what that stands for.  Call if problems or concerns.

## 2013-03-26 NOTE — Addendum Note (Signed)
Addended by: Mike Craze on: 03/26/2013 10:49 AM   Modules accepted: Orders

## 2013-04-05 ENCOUNTER — Ambulatory Visit (HOSPITAL_COMMUNITY): Payer: Self-pay | Admitting: Psychiatry

## 2013-04-22 ENCOUNTER — Telehealth (HOSPITAL_COMMUNITY): Payer: Self-pay | Admitting: Dietician

## 2013-04-22 NOTE — Telephone Encounter (Signed)
Called and left message on voicemail at [phone].

## 2013-04-22 NOTE — Telephone Encounter (Signed)
Received referral via fax from Dr. Margo Aye. No dx specified.

## 2013-04-23 ENCOUNTER — Encounter (HOSPITAL_COMMUNITY): Payer: Self-pay | Admitting: Psychiatry

## 2013-04-23 ENCOUNTER — Ambulatory Visit (INDEPENDENT_AMBULATORY_CARE_PROVIDER_SITE_OTHER): Payer: No Typology Code available for payment source | Admitting: Psychiatry

## 2013-04-23 VITALS — BP 144/86 | Wt 262.2 lb

## 2013-04-23 DIAGNOSIS — F329 Major depressive disorder, single episode, unspecified: Secondary | ICD-10-CM

## 2013-04-23 DIAGNOSIS — F319 Bipolar disorder, unspecified: Secondary | ICD-10-CM

## 2013-04-23 DIAGNOSIS — G5690 Unspecified mononeuropathy of unspecified upper limb: Secondary | ICD-10-CM | POA: Insufficient documentation

## 2013-04-23 DIAGNOSIS — IMO0001 Reserved for inherently not codable concepts without codable children: Secondary | ICD-10-CM

## 2013-04-23 DIAGNOSIS — F3160 Bipolar disorder, current episode mixed, unspecified: Secondary | ICD-10-CM

## 2013-04-23 NOTE — Progress Notes (Signed)
[facility] [address] Progress Note Michelle Clements MRN: [id number] DOB: 1966-11-26 Age: 47 y.o.  Date: 04/23/2013 Start Time: 10:13 AM End Time: 10:35 AM  Chief Complaint: Chief Complaint  Patient presents with  . Depression  . Follow-up  . Medication Refill   S: "I've been to the pain management and they purpose surgery for the (R) and splinting on the (L).  I'm very hopeful about getting everything resolved". Depression 4/10 and Anxiety 5/10, where 1 is the best and 10 is the worst.  Her pain is 6/10 on the same scale.  Her pain doctor gave her some samples of Cymbalta.  They suggest that we try Pristiq.  Will try  Pt comes to appointment for follow-up.  Pt reports that she is compliant with the psychotropic medications with fair benefit and no noticeable side effects.    She appears happier today.  She has a great sense of relief that something is being done to significantly help her pain.  Diagnosis:   Axis I: Bipolar, mixed Axis II: No diagnosis Axis III:  Past Medical History  Diagnosis Date  . Other malaise and fatigue   . Bipolar affective disorder, manic   . Hip pain, left   . Hypertension   . Neck pain   . Tobacco abuse   . Palpitations   . Allergic rhinitis   . Fibromyalgia   . Constipation   . IBS (irritable bowel syndrome)   . Hyperlipidemia   . GERD (gastroesophageal reflux disease)   . Depression   . Asthma   . PTSD (post-traumatic stress disorder)   . 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 Sleep apnea   . 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   Axis IV: occupational problems Axis V: 51-60 moderate symptoms  ADL's:  Intact  Sleep: Good without meds, but gets to sleep about 3 AM.  Cautioned about this pattern with bipoalr in spring time.  Appetite:  Good  Mental Status Examination/Evaluation: Objective:  Appearance: Casual  Eye Contact::  Good  Speech:  Clear and Coherent  Volume:  Normal  Mood:  Anxious  Affect:   Appropriate  Thought Process:  Coherent  Orientation:  Full  Thought Content:  WDL  Suicidal Thoughts:  No  Homicidal Thoughts:  No  Memory:  Immediate;   Fair Recent;   Fair Remote;   Fair  Judgement:  Fair  Insight:  Fair  Psychomotor Activity:  Normal  Concentration:  Fair  Recall:  Fair  Akathisia:  No  Handed:  Right  AIMS (if indicated):     Assets:  Communication Skills Desire for Improvement Housing Social Support  Sleep:      Vital Signs:BP 144/86  Wt 262 lb 3.2 oz (118.933 kg)  BMI 36.3 kg/m2  LMP 03/22/2013  Current Medications: Current outpatient prescriptions:amitriptyline (ELAVIL) 25 MG tablet, Take 1 tablet (25 mg total) by mouth at bedtime., Disp: 30 tablet, Rfl: 1;  cyclobenzaprine (FLEXERIL) 10 MG tablet, Take 1 tablet (10 mg total) by mouth every 6 (six) hours as needed., Disp: 90 tablet, Rfl: 2;  gabapentin (NEURONTIN) 600 MG tablet, Take 1.5 tablets (900 mg total) by mouth 4 (four) times daily., Disp: 180 tablet, Rfl: 2 albuterol (ACCUNEB) 0.63 MG/3ML nebulizer solution, Take 3 mLs (0.63 mg total) by nebulization every 6 (six) hours as needed for wheezing or shortness of breath., Disp: 75 mL, Rfl: 0;  albuterol (PROAIR HFA) 108 (90 BASE) MCG/ACT inhaler, Inhale 2 puffs into the lungs  every 6 (six) hours as needed., Disp: 1 Inhaler, Rfl: 0 hydrOXYzine (ATARAX/VISTARIL) 50 MG tablet, Take 1 tablet (50 mg total) by mouth 3 (three) times daily as needed for anxiety., Disp: 90 tablet, Rfl: 1;  metFORMIN (GLUCOPHAGE) 500 MG tablet, Take 1 tablet (500 mg total) by mouth daily with breakfast., Disp: 30 tablet, Rfl: 0;  NON FORMULARY, CPAP MASK, FACIAL CUSHION AND TUBING. , Disp: , Rfl:  omeprazole (PRILOSEC) 20 MG capsule, Take 1 capsule (20 mg total) by mouth daily., Disp: 30 capsule, Rfl: 0;  polyethylene glycol powder (GLYCOLAX/MIRALAX) powder, Take 17 g by mouth daily as needed., Disp: 255 g, Rfl: 0;  pravastatin (PRAVACHOL) 20 MG tablet, Take 1 tablet (20 mg total)  by mouth daily., Disp: 30 tablet, Rfl: 0 [DISCONTINUED] buPROPion (WELLBUTRIN SR) 150 MG 12 hr tablet, Take 1 tablet (150 mg total) by mouth 2 (two) times daily., Disp: 60 tablet, Rfl: 1   Lab Results:  Results for orders placed during the hospital encounter of 10/13/12 (from the past 8736 hour(s))  DRUGS OF ABUSE SCREEN W ALC, ROUTINE URINE   Collection Time    10/13/12  6:21 PM      Result Value Range   Amphetamine Screen, Ur NEGATIVE  Negative   Marijuana Metabolite NEGATIVE  Negative   Barbiturate Quant, Ur NEGATIVE  Negative   Methadone NEGATIVE  Negative   Propoxyphene NEGATIVE  Negative   Benzodiazepines. NEGATIVE  Negative   Phencyclidine (PCP) NEGATIVE  Negative   Cocaine Metabolites NEGATIVE  Negative   Opiate Screen, Urine POSITIVE (*) Negative   Ethyl Alcohol <10  <10 mg/dL   Creatinine,U 161.2    URINALYSIS, ROUTINE W REFLEX MICROSCOPIC   Collection Time    10/13/12  6:21 PM      Result Value Range   Color, Urine YELLOW  YELLOW   APPearance CLEAR  CLEAR   Specific Gravity, Urine 1.018  1.005 - 1.030   pH 6.5  5.0 - 8.0   Glucose, UA NEGATIVE  NEGATIVE mg/dL   Hgb urine dipstick MODERATE (*) NEGATIVE   Bilirubin Urine NEGATIVE  NEGATIVE   Ketones, ur NEGATIVE  NEGATIVE mg/dL   Protein, ur NEGATIVE  NEGATIVE mg/dL   Urobilinogen, UA 1.0  0.0 - 1.0 mg/dL   Nitrite NEGATIVE  NEGATIVE   Leukocytes, UA TRACE (*) NEGATIVE  URINE RAPID DRUG SCREEN (HOSP PERFORMED)   Collection Time    10/13/12  6:21 PM      Result Value Range   Opiates POSITIVE (*) NONE DETECTED   Cocaine NONE DETECTED  NONE DETECTED   Benzodiazepines NONE DETECTED  NONE DETECTED   Amphetamines NONE DETECTED  NONE DETECTED   Tetrahydrocannabinol NONE DETECTED  NONE DETECTED   Barbiturates NONE DETECTED  NONE DETECTED  URINE MICROSCOPIC-ADD ON   Collection Time    10/13/12  6:21 PM      Result Value Range   Squamous Epithelial / LPF RARE  RARE   WBC, UA 0-2  <3 WBC/hpf  OPIATE, QUANTITATIVE,  URINE   Collection Time    10/13/12  6:21 PM      Result Value Range   Morphine, Confirm NEGATIVE  Cutoff:50 ng/mL   Codeine Urine NEGATIVE  Cutoff:50 ng/mL   Hydromorphone GC/MS Conf 69  Cutoff:50 ng/mL   Hydrocodone 126  Cutoff:50 ng/mL   Norhydrocodone, Ur 421  Cutoff:50 ng/mL   Oxycodone, ur NEGATIVE  Cutoff:50 ng/mL   Noroxycodone, Ur NEGATIVE  Cutoff:50 ng/mL   6 Monoacetylmorphine, Ur-Confirm NEGATIVE  Cutoff:5 ng/mL   Oxymorphone NEGATIVE  Cutoff:50 ng/mL  COMPREHENSIVE METABOLIC PANEL   Collection Time    10/13/12  7:28 PM      Result Value Range   Sodium 140  135 - 145 mEq/L   Potassium 4.0  3.5 - 5.1 mEq/L   Chloride 101  96 - 112 mEq/L   CO2 28  19 - 32 mEq/L   Glucose, Bld 152 (*) 70 - 99 mg/dL   BUN 10  6 - 23 mg/dL   Creatinine, Ser 1.61  0.50 - 1.10 mg/dL   Calcium 09.6  8.4 - 04.5 mg/dL   Total Protein 6.9  6.0 - 8.3 g/dL   Albumin 4.4  3.5 - 5.2 g/dL   AST 36  0 - 37 U/L   ALT 38 (*) 0 - 35 U/L   Alkaline Phosphatase 66  39 - 117 U/L   Total Bilirubin 0.3  0.3 - 1.2 mg/dL   GFR calc non Af Amer 74 (*) >90 mL/min   GFR calc Af Amer 85 (*) >90 mL/min  CBC WITH DIFFERENTIAL   Collection Time    10/13/12  7:28 PM      Result Value Range   WBC 11.0 (*) 4.0 - 10.5 K/uL   RBC 4.32  3.87 - 5.11 MIL/uL   Hemoglobin 11.5 (*) 12.0 - 15.0 g/dL   HCT 40.9 (*) 81.1 - 91.4 %   MCV 80.8  78.0 - 100.0 fL   MCH 26.6  26.0 - 34.0 pg   MCHC 33.0  30.0 - 36.0 g/dL   RDW 78.2  95.6 - 21.3 %   Platelets 386  150 - 400 K/uL   Neutrophils Relative % 64  43 - 77 %   Neutro Abs 7.0  1.7 - 7.7 K/uL   Lymphocytes Relative 27  12 - 46 %   Lymphs Abs 2.9  0.7 - 4.0 K/uL   Monocytes Relative 5  3 - 12 %   Monocytes Absolute 0.6  0.1 - 1.0 K/uL   Eosinophils Relative 4  0 - 5 %   Eosinophils Absolute 0.4  0.0 - 0.7 K/uL   Basophils Relative 1  0 - 1 %   Basophils Absolute 0.1  0.0 - 0.1 K/uL  GLUCOSE, CAPILLARY   Collection Time    10/13/12  8:59 PM      Result Value Range    Glucose-Capillary 122 (*) 70 - 99 mg/dL  GLUCOSE, CAPILLARY   Collection Time    10/14/12  6:33 AM      Result Value Range   Glucose-Capillary 135 (*) 70 - 99 mg/dL  COMPREHENSIVE METABOLIC PANEL   Collection Time    10/14/12  6:45 AM      Result Value Range   Sodium 136  135 - 145 mEq/L   Potassium 4.1  3.5 - 5.1 mEq/L   Chloride 100  96 - 112 mEq/L   CO2 28  19 - 32 mEq/L   Glucose, Bld 132 (*) 70 - 99 mg/dL   BUN 12  6 - 23 mg/dL   Creatinine, Ser 0.86  0.50 - 1.10 mg/dL   Calcium 9.8  8.4 - 57.8 mg/dL   Total Protein 6.8  6.0 - 8.3 g/dL   Albumin 4.2  3.5 - 5.2 g/dL   AST 37  0 - 37 U/L   ALT 39 (*) 0 - 35 U/L   Alkaline Phosphatase 65  39 - 117 U/L   Total Bilirubin  0.5  0.3 - 1.2 mg/dL   GFR calc non Af Amer 76 (*) >90 mL/min   GFR calc Af Amer 88 (*) >90 mL/min  CBC   Collection Time    10/14/12  6:45 AM      Result Value Range   WBC 8.9  4.0 - 10.5 K/uL   RBC 4.25  3.87 - 5.11 MIL/uL   Hemoglobin 11.4 (*) 12.0 - 15.0 g/dL   HCT 16.1 (*) 09.6 - 04.5 %   MCV 80.9  78.0 - 100.0 fL   MCH 26.8  26.0 - 34.0 pg   MCHC 33.1  30.0 - 36.0 g/dL   RDW 40.9  81.1 - 91.4 %   Platelets 334  150 - 400 K/uL  TSH   Collection Time    10/14/12  6:45 AM      Result Value Range   TSH 3.261  0.350 - 4.500 uIU/mL  VITAMIN D 1,25 DIHYDROXY   Collection Time    10/14/12  6:45 AM      Result Value Range   Vitamin D 1, 25 (OH) Total 38  18 - 72 pg/mL   Vitamin D3 1, 25 (OH) 38     Vitamin D2 1, 25 (OH) <8    VITAMIN D 25 HYDROXY   Collection Time    10/14/12  6:45 AM      Result Value Range   Vit D, 25-Hydroxy 39  30 - 89 ng/mL  BILIRUBIN, DIRECT   Collection Time    10/14/12  6:45 AM      Result Value Range   Bilirubin, Direct <0.1  0.0 - 0.3 mg/dL  GLUCOSE, CAPILLARY   Collection Time    10/14/12 11:48 AM      Result Value Range   Glucose-Capillary 115 (*) 70 - 99 mg/dL   Comment 1 Notify RN     Comment 2 Documented in Chart    GLUCOSE, CAPILLARY   Collection  Time    10/14/12  8:51 PM      Result Value Range   Glucose-Capillary 149 (*) 70 - 99 mg/dL  GLUCOSE, CAPILLARY   Collection Time    10/15/12  5:42 AM      Result Value Range   Glucose-Capillary 132 (*) 70 - 99 mg/dL  GLUCOSE, CAPILLARY   Collection Time    10/15/12 12:00 PM      Result Value Range   Glucose-Capillary 81  70 - 99 mg/dL  GLUCOSE, CAPILLARY   Collection Time    10/15/12  4:50 PM      Result Value Range   Glucose-Capillary 124 (*) 70 - 99 mg/dL   Comment 1 Documented in Chart     Comment 2 Notify RN    GLUCOSE, CAPILLARY   Collection Time    10/15/12  8:57 PM      Result Value Range   Glucose-Capillary 134 (*) 70 - 99 mg/dL   Comment 1 Notify RN    GLUCOSE, CAPILLARY   Collection Time    10/16/12  6:19 AM      Result Value Range   Glucose-Capillary 118 (*) 70 - 99 mg/dL  GLUCOSE, CAPILLARY   Collection Time    10/16/12 11:54 AM      Result Value Range   Glucose-Capillary 105 (*) 70 - 99 mg/dL   Comment 1 Notify RN    GLUCOSE, CAPILLARY   Collection Time    10/16/12  5:04 PM      Result  Value Range   Glucose-Capillary 110 (*) 70 - 99 mg/dL  GLUCOSE, CAPILLARY   Collection Time    10/16/12  8:39 PM      Result Value Range   Glucose-Capillary 157 (*) 70 - 99 mg/dL   Comment 1 Notify RN    GLUCOSE, CAPILLARY   Collection Time    10/17/12  6:13 AM      Result Value Range   Glucose-Capillary 134 (*) 70 - 99 mg/dL  GLUCOSE, CAPILLARY   Collection Time    10/17/12 11:59 AM      Result Value Range   Glucose-Capillary 76  70 - 99 mg/dL  GLUCOSE, CAPILLARY   Collection Time    10/17/12  5:00 PM      Result Value Range   Glucose-Capillary 102 (*) 70 - 99 mg/dL   Comment 1 Notify RN     Vitamin D level in typical range, but only at 39.  Some clinicians like it to be in the 59 and above range.  She is on 5,00 once a day Vit D replacement and see if the level can be increased to that range.   Physical Findings: AIMS:  , ,  ,  ,    CIWA:    COWS:      Treatment Plan Summary: Daily contact with patient to assess and evaluate symptoms and progress in treatment Medication management  Plan/Discussion: I took her vitals.  I reviewed CC, tobacco/med/surg Hx, meds effects/ side effects, problem list, therapies and responses as well as current situation/symptoms discussed options. Try Prestiq for management of the peripheral neuropathy.  Cont other meds. See orders and pt instructions for more details.  MEDICATIONS this encounter: No orders of the defined types were placed in this encounter.    Medical Decision Making Problem Points:  Established problem, stable/improving (1), New problem, with no additional work-up planned (3), Review of last therapy session (1) and Review of psycho-social stressors (1) Data Points:  Review or order clinical lab tests (1) Review of medication regiment & side effects (2) Review of new medications or change in dosage (2)  I certify that outpatient services furnished can reasonably be expected to improve the patient's condition.   Orson Aloe, MD, [facility]

## 2013-04-24 NOTE — Telephone Encounter (Signed)
Pt left voicemail on 04/22/13 at 1634. Returned call at [phone]. Appointment scheduled for 04/30/13 at 1100.

## 2013-04-30 ENCOUNTER — Telehealth (HOSPITAL_COMMUNITY): Payer: Self-pay | Admitting: Dietician

## 2013-04-30 NOTE — Telephone Encounter (Signed)
Outpatient Initial Nutrition Assessment  Date:04/30/2013   Appt Start Time: 1059  Referring Physician: Dr. Margo Aye Reason for Visit: obesity  Nutrition Assessment:  Ht: 71" Wt: 259#  IBW: 155# %IBW: 167% UBW: 260# %UBW: 100% BMI:  36.12. Meets criteria for obesity, class II.  Goal Weight: 233# (10% loss of current body weight) Weight hx:  Wt Readings from Last 10 Encounters:  04/23/13 262 lb 3.2 oz (118.933 kg)  03/26/13 263 lb 9.6 oz (119.568 kg)  02/05/13 262 lb 6.4 oz (119.024 kg)  12/25/12 263 lb (119.296 kg)  11/19/12 260 lb (117.935 kg)  10/22/12 257 lb 6.4 oz (116.756 kg)  10/13/12 259 lb (117.482 kg)  10/10/12 262 lb 3.2 oz (118.933 kg)  08/21/12 264 lb (119.75 kg)  02/21/12 251 lb 12.8 oz (114.216 kg)   Pt reports UBW between 260-265, which she has maintained for the past 2 years. Her l;owest weight was 135# at age 31. She reports weighing 152# 26 years ago. She has progressively gained weight since having children. She reports severe weight gain since 2009, after nervous breakdown.   Estimated nutritional needs: 1900-200 kcals daily, 94-118 grams protein daily, 1.9-2.0 L fluid daily  PMH:  Past Medical History  Diagnosis Date  . Other malaise and fatigue   . Bipolar affective disorder, manic   . Hip pain, left   . Hypertension   . Neck pain   . Tobacco abuse   . Palpitations   . Allergic rhinitis   . Fibromyalgia   . Constipation   . IBS (irritable bowel syndrome)   . Hyperlipidemia   . GERD (gastroesophageal reflux disease)   . Depression   . Asthma   . PTSD (post-traumatic stress disorder)   . 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 Sleep apnea   . 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    Medications:  Current Outpatient Rx  Name  Route  Sig  Dispense  Refill  . albuterol (ACCUNEB) 0.63 MG/3ML nebulizer solution   Nebulization   Take 3 mLs (0.63 mg total) by nebulization every 6 (six) hours as needed for wheezing or shortness of  breath.   75 mL   0   . albuterol (PROAIR HFA) 108 (90 BASE) MCG/ACT inhaler   Inhalation   Inhale 2 puffs into the lungs every 6 (six) hours as needed.   1 Inhaler   0   . amitriptyline (ELAVIL) 25 MG tablet   Oral   Take 1 tablet (25 mg total) by mouth at bedtime.   30 tablet   1   . cyclobenzaprine (FLEXERIL) 10 MG tablet   Oral   Take 1 tablet (10 mg total) by mouth every 6 (six) hours as needed.   90 tablet   2   . gabapentin (NEURONTIN) 600 MG tablet   Oral   Take 1.5 tablets (900 mg total) by mouth 4 (four) times daily.   180 tablet   2   . hydrOXYzine (ATARAX/VISTARIL) 50 MG tablet   Oral   Take 1 tablet (50 mg total) by mouth 3 (three) times daily as needed for anxiety.   90 tablet   1   . metFORMIN (GLUCOPHAGE) 500 MG tablet   Oral   Take 1 tablet (500 mg total) by mouth daily with breakfast.   30 tablet   0   . NON FORMULARY      CPAP MASK, FACIAL CUSHION AND TUBING.          Marland Kitchen  omeprazole (PRILOSEC) 20 MG capsule   Oral   Take 1 capsule (20 mg total) by mouth daily.   30 capsule   0   . polyethylene glycol powder (GLYCOLAX/MIRALAX) powder   Oral   Take 17 g by mouth daily as needed.   255 g   0   . pravastatin (PRAVACHOL) 20 MG tablet   Oral   Take 1 tablet (20 mg total) by mouth daily.   30 tablet   0     Labs: CMP     Component Value Date/Time   NA 136 10/14/2012 0645   K 4.1 10/14/2012 0645   CL 100 10/14/2012 0645   CO2 28 10/14/2012 0645   GLUCOSE 132* 10/14/2012 0645   BUN 12 10/14/2012 0645   CREATININE 0.91 10/14/2012 0645   CREATININE 0.88 02/21/2012 1153   CALCIUM 9.8 10/14/2012 0645   PROT 6.8 10/14/2012 0645   ALBUMIN 4.2 10/14/2012 0645   AST 37 10/14/2012 0645   ALT 39* 10/14/2012 0645   ALKPHOS 65 10/14/2012 0645   BILITOT 0.5 10/14/2012 0645   GFRNONAA 76* 10/14/2012 0645   GFRAA 88* 10/14/2012 0645    Lipid Panel     Component Value Date/Time   CHOL 252* 03/06/2009 0141   TRIG 295* 03/06/2009 0141   HDL  40 03/06/2009 0141   CHOLHDL 6.3 Ratio 03/06/2009 0141   VLDL 59* 03/06/2009 0141   LDLCALC 153* 03/06/2009 0141     No results found for this basename: HGBA1C   Lab Results  Component Value Date   LDLCALC 153* 03/06/2009   CREATININE 0.91 10/14/2012     Lifestyle/ social habits: Ms. Driscoll is a very pleasant lady who resides in [place] with her 104 year old daughter, husband, and dogs. She also has  A 4 year old daughter and 2 children who reside in the area. 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 She reports that her stress level is 4/10, but still struggles with mental illness. She reports that prior to January of this year, she reported an average stress level of an 8/10. She reports an extremely positive experience after being hospitalized for opioid detox at [facility]; she states "things are really good now". She does not exercise regularly and hasn't since high school, when she was very active, participating in track, volleyball, and basketball. She has a gym membership Financial planner) and is contemplating participating in water exercises.   Nutrition hx/habits: Ms. Shanley reports no recent changes to her diet. She prepares the majority of her meals and does the grocery shopping for her household. She eats out once a week after church. She has been baking her foods and has started eating salad regularly. She eats 3 meals a day and follows a fairly consistent meal schedule. She reports drinking around 3 20 oz glasses of sweet tea daily. She also admits to snacking at night- consuming cheetos or fudge bars in front of the TV. Glycemic control is good, although she mentioned that CBGS have been in the 200's lately.   Diet recall: Breakfast (6:30 AM): coffee (sweetened with Chemical engineer) OR tea (sweetened with sugar), cerea (raisin bran, special K, shredded wheat, although  sometimes fruit loops or Trix); Lunch (12 PM): ham and cheese or peanut butter sandwich on white bread OR meat and rice; Dinner: meat, rice, vegetable; Snack: cheetos or fudge bar.  Nutrition Diagnosis: Excessive enregy intake r/t excessive late night snacking, physical inactivity AEB hx of weight gain, BMI: 36.12.   Nutrition Intervention: Nutrition rx: 1500 kcal NAS, no added sugar diet; 3 meals per day; no snacks; physical activity as tolerated; low calorie beverages only  Education/Counseling Provided: Educated pt on principles of weight management. Discussed principles of energy expenditure and how changes in diet and physical activity affect weight status. Discussed nutritional content of commonly eaten foods and suggested healthier alternatives. Educated pt on plate method and a general, healthful diet that includes low fat dairy, lean meats, whole fruits and vegetables, and whole grains most often. Discussed importance of a healthy diet along with regular physical activity (at least 30 minutes 5 times per week) to achieve weight loss goals. Encouraged slow, moderate weight loss (0.5-2# weight loss per week) and adopting healthy lifestyle changes vs. obtaining a certain body type or weight. Encouraged weighing self weekly at a consistent day and time of choice. Showed pt functionality of MyFitnessPal and encouraged using a food diary to better track caloric intake. Used TeachBack to assess understanding.   Understanding, Motivation, Ability to Follow Recommendations: Expect fair to good compliance.  Monitoring and Evaluation: Goals: 1) 1-2# wt loss per week; 2) Physical activity as tolerated; 3) 1 glass of tea daily  Recommendations: 1) For weight loss: 1400-1500 kcals daily; 2) Keep food diary (ex. MyFitness Pal); 3) Break exercise up into smaller, more frequent sessions; 4) Use low calorie drink mixes instead of regular tea/ sodas  F/U: 6-8 weeks. Pt to call and confirm date at end of July-  needs to check personal schedule.   Melody Haver, RD, LDN 04/30/2013  Appt EndTime: 1249

## 2013-05-17 ENCOUNTER — Ambulatory Visit (HOSPITAL_COMMUNITY): Payer: Self-pay | Admitting: Psychiatry

## 2013-05-17 DIAGNOSIS — G5600 Carpal tunnel syndrome, unspecified upper limb: Secondary | ICD-10-CM | POA: Insufficient documentation

## 2013-05-30 ENCOUNTER — Other Ambulatory Visit (HOSPITAL_COMMUNITY): Payer: Self-pay | Admitting: Psychiatry

## 2013-06-03 ENCOUNTER — Other Ambulatory Visit: Payer: Self-pay | Admitting: Neurosurgery

## 2013-06-04 ENCOUNTER — Encounter (HOSPITAL_COMMUNITY): Payer: Self-pay | Admitting: Pharmacy Technician

## 2013-06-05 ENCOUNTER — Other Ambulatory Visit: Payer: Self-pay | Admitting: Neurosurgery

## 2013-06-06 ENCOUNTER — Encounter (HOSPITAL_COMMUNITY): Payer: Self-pay

## 2013-06-06 ENCOUNTER — Encounter (HOSPITAL_COMMUNITY)
Admission: RE | Admit: 2013-06-06 | Discharge: 2013-06-06 | Disposition: A | Discharge status: Home / Self Care | Payer: Medicare Other | Source: Ambulatory Visit | Attending: Neurosurgery | Admitting: Neurosurgery

## 2013-06-06 ENCOUNTER — Ambulatory Visit (HOSPITAL_COMMUNITY)
Admission: RE | Admit: 2013-06-06 | Discharge: 2013-06-06 | Disposition: A | Discharge status: Home / Self Care | Payer: Medicare Other | Source: Ambulatory Visit | Attending: Neurosurgery | Admitting: Neurosurgery

## 2013-06-06 ENCOUNTER — Encounter (HOSPITAL_COMMUNITY): Payer: Self-pay | Admitting: Pharmacy Technician

## 2013-06-06 DIAGNOSIS — Z01818 Encounter for other preprocedural examination: Secondary | ICD-10-CM | POA: Insufficient documentation

## 2013-06-06 DIAGNOSIS — Z01812 Encounter for preprocedural laboratory examination: Secondary | ICD-10-CM | POA: Insufficient documentation

## 2013-06-06 DIAGNOSIS — G5600 Carpal tunnel syndrome, unspecified upper limb: Secondary | ICD-10-CM | POA: Insufficient documentation

## 2013-06-06 DIAGNOSIS — Z01810 Encounter for preprocedural cardiovascular examination: Secondary | ICD-10-CM | POA: Insufficient documentation

## 2013-06-06 HISTORY — DX: Other amnesia: R41.3

## 2013-06-06 HISTORY — DX: Urgency of urination: R39.15

## 2013-06-06 HISTORY — DX: Anemia, unspecified: D64.9

## 2013-06-06 HISTORY — DX: Unspecified osteoarthritis, unspecified site: M19.90

## 2013-06-06 LAB — CBC
MCH: 33.5 pg (ref 26.0–34.0)
MCHC: 35.1 g/dL (ref 30.0–36.0)
Platelets: 327 10*3/uL (ref 150–400)
RBC: 4.06 MIL/uL (ref 3.87–5.11)

## 2013-06-06 LAB — BASIC METABOLIC PANEL
Calcium: 9.8 mg/dL (ref 8.4–10.5)
GFR calc Af Amer: 87 mL/min — ABNORMAL LOW (ref >90)
GFR calc non Af Amer: 75 mL/min — ABNORMAL LOW (ref >90)
Glucose, Bld: 96 mg/dL (ref 70–99)
Potassium: 4.4 mEq/L (ref 3.5–5.1)
Sodium: 141 mEq/L (ref 135–145)

## 2013-06-06 LAB — HCG, SERUM, QUALITATIVE: Preg, Serum: NEGATIVE

## 2013-06-06 NOTE — Progress Notes (Signed)
Nurse called Erie Noe and requested that Dr. Gerlene Fee sign orders at his earliest convenience. Erie Noe said he was in Neuro OR today but she would send him a message.

## 2013-06-06 NOTE — Progress Notes (Signed)
Patient denied having a stress test or cardiac cath but informed Nurse that she has sleep apnea and uses a CPAP machine nightly. PCP is Dr. Dwana Melena of [place], [place], and patient sees Dr. Orson Aloe for Behavior health. Encounters in [facility].

## 2013-06-06 NOTE — Pre-Procedure Instructions (Signed)
JEANAE WHITMILL  06/06/2013   Your procedure is scheduled on:  Monday June 10, 2013.  Report to [facility] at 5:30 AM. Come in through entrance "A" Take [place] Elevators to 3rd Floor Short Stay  Call this number if you have problems the morning of surgery: [phone]   Remember:   Do not eat food or drink liquids after midnight.   Take these medicines the morning of surgery with A SIP OF WATER: Acetaminophen (Tylenol) if needed for pain, Albuterol nebulizer/inhaler if needed for shortness of breath, Duloxetine (Cymbalta), Gabapentin (Neurontin, Dexlansoprazole (Dexilant)  Do NOT take any diabetic medications the morning of your surgery   Do not wear jewelry, make-up or nail polish.  Do not wear lotions, powders, or perfumes.   Do not shave 48 hours prior to surgery.   Do not bring valuables to the hospital.  [facility] is not responsible  for any belongings or valuables.  Contacts, dentures or bridgework may not be worn into surgery.  Leave suitcase in the car. After surgery it may be brought to your room.  For patients admitted to the hospital, checkout time is 11:00 AM the day of discharge.   Patients discharged the day of surgery will not be allowed to drive home.  Name and phone number of your driver: Friend/Friend  Special Instructions: Shower using CHG 2 nights before surgery and the night before surgery.  If you shower the day of surgery use CHG.  Use special wash - you have one bottle of CHG for all showers.  You should use approximately 1/3 of the bottle for each shower.   Please read over the following fact sheets that you were given: Pain Booklet, Coughing and Deep Breathing, Blood Transfusion Information, MRSA Information and Surgical Site Infection Prevention

## 2013-06-10 ENCOUNTER — Ambulatory Visit (HOSPITAL_COMMUNITY)
Admission: RE | Admit: 2013-06-10 | Discharge: 2013-06-10 | Disposition: A | Discharge status: Home / Self Care | Payer: Medicare Other | Source: Ambulatory Visit | Attending: Neurosurgery | Admitting: Neurosurgery

## 2013-06-10 ENCOUNTER — Encounter (HOSPITAL_COMMUNITY): Payer: Self-pay | Admitting: Anesthesiology

## 2013-06-10 ENCOUNTER — Encounter (HOSPITAL_COMMUNITY): Payer: Self-pay | Admitting: *Deleted

## 2013-06-10 ENCOUNTER — Encounter (HOSPITAL_COMMUNITY)
Admission: RE | Disposition: A | Discharge status: Home / Self Care | Payer: Self-pay | Source: Ambulatory Visit | Attending: Neurosurgery

## 2013-06-10 ENCOUNTER — Ambulatory Visit (HOSPITAL_COMMUNITY): Payer: Medicare Other | Admitting: Anesthesiology

## 2013-06-10 DIAGNOSIS — D649 Anemia, unspecified: Secondary | ICD-10-CM | POA: Insufficient documentation

## 2013-06-10 DIAGNOSIS — K589 Irritable bowel syndrome without diarrhea: Secondary | ICD-10-CM | POA: Insufficient documentation

## 2013-06-10 DIAGNOSIS — G5600 Carpal tunnel syndrome, unspecified upper limb: Secondary | ICD-10-CM | POA: Insufficient documentation

## 2013-06-10 DIAGNOSIS — Z79899 Other long term (current) drug therapy: Secondary | ICD-10-CM | POA: Insufficient documentation

## 2013-06-10 DIAGNOSIS — J45909 Unspecified asthma, uncomplicated: Secondary | ICD-10-CM | POA: Insufficient documentation

## 2013-06-10 DIAGNOSIS — K219 Gastro-esophageal reflux disease without esophagitis: Secondary | ICD-10-CM | POA: Insufficient documentation

## 2013-06-10 DIAGNOSIS — I10 Essential (primary) hypertension: Secondary | ICD-10-CM | POA: Insufficient documentation

## 2013-06-10 DIAGNOSIS — G4730 Sleep apnea, unspecified: Secondary | ICD-10-CM | POA: Insufficient documentation

## 2013-06-10 DIAGNOSIS — M129 Arthropathy, unspecified: Secondary | ICD-10-CM | POA: Insufficient documentation

## 2013-06-10 DIAGNOSIS — F605 Obsessive-compulsive personality disorder: Secondary | ICD-10-CM | POA: Insufficient documentation

## 2013-06-10 DIAGNOSIS — E785 Hyperlipidemia, unspecified: Secondary | ICD-10-CM | POA: Insufficient documentation

## 2013-06-10 DIAGNOSIS — IMO0001 Reserved for inherently not codable concepts without codable children: Secondary | ICD-10-CM | POA: Insufficient documentation

## 2013-06-10 DIAGNOSIS — F3110 Bipolar disorder, current episode manic without psychotic features, unspecified: Secondary | ICD-10-CM | POA: Insufficient documentation

## 2013-06-10 DIAGNOSIS — K5900 Constipation, unspecified: Secondary | ICD-10-CM | POA: Insufficient documentation

## 2013-06-10 DIAGNOSIS — E119 Type 2 diabetes mellitus without complications: Secondary | ICD-10-CM | POA: Insufficient documentation

## 2013-06-10 HISTORY — PX: CARPAL TUNNEL RELEASE: SHX101

## 2013-06-10 SURGERY — CARPAL TUNNEL RELEASE
Anesthesia: General | Site: Hand | Laterality: Right | Wound class: Clean

## 2013-06-10 MED ORDER — SODIUM CHLORIDE 0.9 % IV SOLN
INTRAVENOUS | Status: AC
  Filled 2013-06-10: qty 500

## 2013-06-10 MED ORDER — PROPOFOL 10 MG/ML IV BOLUS
INTRAVENOUS | Status: DC | PRN
  Administered 2013-06-10: 200 mg via INTRAVENOUS

## 2013-06-10 MED ORDER — ONDANSETRON HCL 4 MG/2ML IJ SOLN
INTRAMUSCULAR | Status: DC | PRN
  Administered 2013-06-10: 4 mg via INTRAVENOUS

## 2013-06-10 MED ORDER — OXYCODONE HCL 5 MG PO TABS: 5.0000 mg | ORAL_TABLET | Freq: Once | ORAL | Status: DC | PRN

## 2013-06-10 MED ORDER — HYDROMORPHONE HCL PF 1 MG/ML IJ SOLN
0.2500 mg | INTRAMUSCULAR | Status: DC | PRN
  Administered 2013-06-10 (×2): 0.5 mg via INTRAVENOUS

## 2013-06-10 MED ORDER — LIDOCAINE HCL (CARDIAC) 20 MG/ML IV SOLN
INTRAVENOUS | Status: DC | PRN
  Administered 2013-06-10: 100 mg via INTRAVENOUS

## 2013-06-10 MED ORDER — OXYCODONE HCL 5 MG/5ML PO SOLN: 5.0000 mg | Freq: Once | ORAL | Status: DC | PRN

## 2013-06-10 MED ORDER — MIDAZOLAM HCL 5 MG/5ML IJ SOLN
INTRAMUSCULAR | Status: DC | PRN
  Administered 2013-06-10 (×2): 1 mg via INTRAVENOUS

## 2013-06-10 MED ORDER — HYDROMORPHONE HCL PF 1 MG/ML IJ SOLN
INTRAMUSCULAR | Status: AC
  Filled 2013-06-10: qty 1

## 2013-06-10 MED ORDER — CEFAZOLIN SODIUM 1-5 GM-% IV SOLN
INTRAVENOUS | Status: DC | PRN
  Administered 2013-06-10: 1 g via INTRAVENOUS

## 2013-06-10 MED ORDER — LACTATED RINGERS IV SOLN
INTRAVENOUS | Status: DC | PRN
  Administered 2013-06-10: 07:00:00 via INTRAVENOUS

## 2013-06-10 MED ORDER — BACITRACIN 50000 UNITS IM SOLR
INTRAMUSCULAR | Status: AC
  Filled 2013-06-10: qty 1

## 2013-06-10 MED ORDER — CEFAZOLIN SODIUM 1-5 GM-% IV SOLN
INTRAVENOUS | Status: AC
  Filled 2013-06-10: qty 50

## 2013-06-10 MED ORDER — 0.9 % SODIUM CHLORIDE (POUR BTL) OPTIME
TOPICAL | Status: DC | PRN
  Administered 2013-06-10: 1000 mL

## 2013-06-10 MED ORDER — PROMETHAZINE HCL 25 MG/ML IJ SOLN: 6.2500 mg | INTRAMUSCULAR | Status: DC | PRN

## 2013-06-10 MED ORDER — FENTANYL CITRATE 0.05 MG/ML IJ SOLN
INTRAMUSCULAR | Status: DC | PRN
  Administered 2013-06-10 (×3): 50 ug via INTRAVENOUS
  Administered 2013-06-10: 25 ug via INTRAVENOUS

## 2013-06-10 SURGICAL SUPPLY — 36 items
18 INCH TOURNIQUET ×2 IMPLANT
BANDAGE ELASTIC 3 VELCRO ST LF (GAUZE/BANDAGES/DRESSINGS) ×4 IMPLANT
BANDAGE GAUZE ELAST BULKY 4 IN (GAUZE/BANDAGES/DRESSINGS) ×2 IMPLANT
BLADE SURG 15 STRL LF DISP TIS (BLADE) ×1 IMPLANT
BLADE SURG 15 STRL SS (BLADE) ×1
BNDG ESMARK 4X9 LF (GAUZE/BANDAGES/DRESSINGS) ×2 IMPLANT
BRUSH SCRUB EZ PLAIN DRY (MISCELLANEOUS) ×2 IMPLANT
CANISTER SUCTION 2500CC (MISCELLANEOUS) ×2 IMPLANT
CLOTH BEACON ORANGE TIMEOUT ST (SAFETY) ×2 IMPLANT
CORDS BIPOLAR (ELECTRODE) ×2 IMPLANT
COVER TABLE BACK 60X90 (DRAPES) IMPLANT
DRAPE EXTREMITY T 121X128X90 (DRAPE) ×2 IMPLANT
DRSG EMULSION OIL 3X3 NADH (GAUZE/BANDAGES/DRESSINGS) ×2 IMPLANT
GAUZE SPONGE 4X4 16PLY XRAY LF (GAUZE/BANDAGES/DRESSINGS) ×2 IMPLANT
GLOVE BIOGEL PI IND STRL 8 (GLOVE) ×2 IMPLANT
GLOVE BIOGEL PI INDICATOR 8 (GLOVE) ×2
GLOVE ECLIPSE 7.5 STRL STRAW (GLOVE) ×6 IMPLANT
GLOVE EXAM NITRILE LRG STRL (GLOVE) IMPLANT
GLOVE EXAM NITRILE XL STR (GLOVE) IMPLANT
GLOVE EXAM NITRILE XS STR PU (GLOVE) ×2 IMPLANT
GOWN BRE IMP SLV AUR LG STRL (GOWN DISPOSABLE) IMPLANT
GOWN BRE IMP SLV AUR XL STRL (GOWN DISPOSABLE) ×2 IMPLANT
GOWN STRL REIN 2XL LVL4 (GOWN DISPOSABLE) ×2 IMPLANT
KIT BASIN OR (CUSTOM PROCEDURE TRAY) ×2 IMPLANT
KIT ROOM TURNOVER OR (KITS) ×2 IMPLANT
NS IRRIG 1000ML POUR BTL (IV SOLUTION) ×2 IMPLANT
PACK SURGICAL SETUP 50X90 (CUSTOM PROCEDURE TRAY) ×2 IMPLANT
SPONGE GAUZE 4X4 12PLY (GAUZE/BANDAGES/DRESSINGS) ×2 IMPLANT
STOCKINETTE 4X48 STRL (DRAPES) ×2 IMPLANT
SUT ETHILON 3 0 FSL (SUTURE) ×2 IMPLANT
SUT VIC AB 3-0 FS2 27 (SUTURE) ×2 IMPLANT
SYR BULB 3OZ (MISCELLANEOUS) ×2 IMPLANT
TOWEL OR 17X24 6PK STRL BLUE (TOWEL DISPOSABLE) ×2 IMPLANT
TOWEL OR 17X26 10 PK STRL BLUE (TOWEL DISPOSABLE) ×2 IMPLANT
UNDERPAD 30X30 INCONTINENT (UNDERPADS AND DIAPERS) ×2 IMPLANT
WATER STERILE IRR 1000ML POUR (IV SOLUTION) IMPLANT

## 2013-06-10 NOTE — Preoperative (Signed)
Beta Blockers   Reason not to administer Beta Blockers:Not Applicable 

## 2013-06-10 NOTE — Progress Notes (Signed)
After patient arrived to post op patient voiced complaint of having a sharp pain in abdomen and feeling "light headed." Blood pressure obtained and it was 87/46 pulse was 68 and O2 sats were 98% on room air. Nurse reclined patient in recliner and placed a cool wash cloth on forehead. After about 10 minutes patient stated she no longer had a sharp pain in her abdomen and she was no longer light headed. Husband at chairside. Will continue to closely monitor.

## 2013-06-10 NOTE — H&P (Signed)
Michelle Clements is an 47 y.o. female.   Chief Complaint: Right carpal tunnel syndrome HPI: The patient is a 47 year old female who presented with right wrist pain as well as numbness and tingling. She's been treated for a right arm radiculopathy but has not been able to have a diagnosis established regarding her cervical region. She's continued under our care and developed issues were related to her right wrist and hand . She had EMG nerve conduction velocity studies which showed fairly significant carpal tunnel syndrome electrically in her right hand. After discussing the options the patient requested surgery now comes for a right carpal tunnel release. I had a long discussion with her regarding the risks and benefits of surgical intervention. The risks discussed include but are not limited to bleeding infection weakness and numbness. We've discussed alternative methods of therapy offered risks and benefits of nonintervention. She's had the opportunity to ask numerous questions and appears to understand. With this information in hand she has requested that we proceed with surgery.  Past Medical History  Diagnosis Date  . Other malaise and fatigue   . Bipolar affective disorder, manic   . Hip pain, left   . Hypertension   . Neck pain   . Tobacco abuse   . Palpitations   . Allergic rhinitis   . Fibromyalgia   . Constipation   . IBS (irritable bowel syndrome)   . Hyperlipidemia   . GERD (gastroesophageal reflux disease)   . Depression   . Asthma   . PTSD (post-traumatic stress disorder)   . 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  . History of Holter monitoring     normal findings after wearing  . Sleep apnea     wears CPAP at bedtime  . Pneumonia 1988    hx of  . Poor short term memory   . Urgency of urination   . Arthritis     lower back and hips  . Anemia   . Anxiety     Past Surgical History  Procedure Laterality Date   . Cholecystectomy    . Tubal ligation    . Muscle biopsy  2008    right leg    Family History  Problem Relation Age of Onset  . Fibromyalgia Mother   . Diabetes type II Mother   . Depression Mother   . Alzheimer's disease Mother   . GER disease Mother   . Heart disease Mother   . Paranoid behavior Mother   . Dementia Mother   . Heart attack Father 31    MI x5  . Stroke Father     CVA x7  . Asthma Father   . Brain cancer Father   . Seizures Father   . Anxiety disorder Sister   . OCD Sister   . Sexual abuse Sister   . Physical abuse Sister   . Anxiety disorder Sister   . Alcohol abuse Neg Hx   . Drug abuse Neg Hx   . Schizophrenia Neg Hx   . ADD / ADHD Daughter   . ADD / ADHD Daughter    Social History:  reports that she quit smoking about 2 years ago. She has never used smokeless tobacco. She reports that she does not drink alcohol or use illicit drugs.  Allergies:  Allergies  Allergen Reactions  . Sulfonamide Derivatives Anaphylaxis     : Angioedema    Medications Prior to Admission  Medication Sig  Dispense Refill  . 5-Hydroxytryptophan (5-HTP PO) Take 50 mg by mouth daily.      Marland Kitchen acetaminophen (TYLENOL) 500 MG tablet Take 1,000 mg by mouth every 6 (six) hours as needed for pain or fever.      Marland Kitchen amitriptyline (ELAVIL) 25 MG tablet Take 25 mg by mouth at bedtime.      . Cholecalciferol 1000 UNITS TBDP Take 1 tablet by mouth daily.      . cyclobenzaprine (FLEXERIL) 10 MG tablet Take 10 mg by mouth every 6 (six) hours as needed for muscle spasms.      Marland Kitchen dexlansoprazole (DEXILANT) 60 MG capsule Take 60 mg by mouth daily.      . DULoxetine (CYMBALTA) 20 MG capsule Take 40 mg by mouth daily.      . Ferrous Sulfate (IRON) 325 (65 FE) MG TABS Take 32.5 mg by mouth daily.      Marland Kitchen gabapentin (NEURONTIN) 600 MG tablet Take 1,200 mg by mouth 3 (three) times daily.      . hydrOXYzine (ATARAX/VISTARIL) 50 MG tablet Take 1 tablet (50 mg total) by mouth 3 (three) times daily as  needed for anxiety.  90 tablet  1  . metFORMIN (GLUCOPHAGE) 500 MG tablet Take 500 mg by mouth 2 (two) times daily with a meal.      . olmesartan (BENICAR) 20 MG tablet Take 20 mg by mouth daily.      . polyethylene glycol (MIRALAX / GLYCOLAX) packet Take 17 g by mouth daily as needed (for constipation).      . pravastatin (PRAVACHOL) 40 MG tablet Take 40 mg by mouth daily.      Marland Kitchen albuterol (ACCUNEB) 0.63 MG/3ML nebulizer solution Take 3 mLs (0.63 mg total) by nebulization every 6 (six) hours as needed for wheezing or shortness of breath.  75 mL  0  . albuterol (PROVENTIL HFA;VENTOLIN HFA) 108 (90 BASE) MCG/ACT inhaler Inhale 2 puffs into the lungs every 6 (six) hours as needed for wheezing or shortness of breath.        Results for orders placed during the hospital encounter of 06/10/13 (from the past 48 hour(s))  GLUCOSE, CAPILLARY     Status: Abnormal   Collection Time    06/10/13  6:39 AM      Result Value Range   Glucose-Capillary 113 (*) 70 - 99 mg/dL   No results found.  Review of systems not obtained due to patient factors.  Blood pressure 102/69, pulse 79, temperature 98.2 F (36.8 C), temperature source Oral, resp. rate 20, SpO2 97.00%.  The patient is awake or and oriented. His no facial asymmetry. Her gait is nonantalgic. Reflexes are decreased but equal. He is normal strength and sensation. His positive Tinel's sign and Phalen's maneuver on the right upper extremity. Assessment/Plan Impression is that of a right carpal tunnel syndrome and the plan is for a right carpal tunnel release.  Reinaldo Meeker, MD 06/10/2013, 7:35 AM

## 2013-06-10 NOTE — Anesthesia Preprocedure Evaluation (Addendum)
Anesthesia Evaluation  Patient identified by MRN, date of birth, ID band Patient awake    Reviewed: Allergy & Precautions, H&P , NPO status , Patient's Chart, lab work & pertinent test results  History of Anesthesia Complications Negative for: history of anesthetic complications  Airway Mallampati: I TM Distance: >3 FB Neck ROM: Full    Dental  (+) Teeth Intact and Dental Advisory Given   Pulmonary asthma , sleep apnea and Continuous Positive Airway Pressure Ventilation , pneumonia -, resolved, Current Smoker,    Pulmonary exam normal       Cardiovascular hypertension, Pt. on medications     Neuro/Psych  Headaches, PSYCHIATRIC DISORDERS Anxiety Depression Bipolar Disorder negative neurological ROS     GI/Hepatic Neg liver ROS, GERD-  ,  Endo/Other  diabetes, Oral Hypoglycemic Agents  Renal/GU negative Renal ROS     Musculoskeletal   Abdominal   Peds  Hematology   Anesthesia Other Findings   Reproductive/Obstetrics                          Anesthesia Physical Anesthesia Plan  ASA: III  Anesthesia Plan: General   Post-op Pain Management:    Induction: Intravenous  Airway Management Planned: LMA  Additional Equipment:   Intra-op Plan:   Post-operative Plan: Extubation in OR  Informed Consent: I have reviewed the patients History and Physical, chart, labs and discussed the procedure including the risks, benefits and alternatives for the proposed anesthesia with the patient or authorized representative who has indicated his/her understanding and acceptance.   Dental advisory given  Plan Discussed with: CRNA, Anesthesiologist and Surgeon  Anesthesia Plan Comments:        Anesthesia Quick Evaluation

## 2013-06-10 NOTE — Op Note (Signed)
Preop diagnosis: Right carpal tunnel syndrome Postop diagnosis: Same Procedure: Right carpal tunnel release Surgeon: Kalsey Lull  After induction of general anesthetic and exsanguinated the right upper extremity with a tourniquet the right forearm wrist and hand were prepped and draped in usual sterile fashion. A curvilinear incision was made starting at the wrist in line with the ring finger that happened in the palm and then turned into a radial direction. Subcutaneous fat was dissected and self-retaining tract was placed for exposure. Transverse carpal ligament was easily identified and incised from proximal to distal direction and the median nerve was well visualized well decompressed beneath it. We elevated the tissue of the distal wrist undermined a bit of soft tissue to be compressing nerve as well and then did the same into the mid palm. At this time there was well visualized well decompressed completely along its course. We irrigated and closed with inverted Vicryl in the subcutaneous tissues and interrupted nylon on the skin. A sterile bulky dressing was applied along with Ace wrap and the patient was extubated and taken to recovery in stable condition.

## 2013-06-10 NOTE — Anesthesia Procedure Notes (Signed)
Procedure Name: LMA Insertion Date/Time: 06/10/2013 7:48 AM Performed by: Armandina Gemma Pre-anesthesia Checklist: Patient identified, Timeout performed, Emergency Drugs available, Suction available and Patient being monitored Patient Re-evaluated:Patient Re-evaluated prior to inductionOxygen Delivery Method: Circle system utilized Preoxygenation: Pre-oxygenation with 100% oxygen Intubation Type: IV induction Ventilation: Mask ventilation without difficulty LMA: LMA inserted and LMA with gastric port inserted LMA Size: 4.0 Tube type: Oral Number of attempts: 1 Placement Confirmation: positive ETCO2 and breath sounds checked- equal and bilateral ETT to lip (cm): 20 cc air. Tube secured with: Tape Dental Injury: Teeth and Oropharynx as per pre-operative assessment  Comments: IV induction Singer- LMA insertion AM CRNA atraumatic

## 2013-06-10 NOTE — Progress Notes (Signed)
Patient preparing for discharge. Patient denied having any dizziness or stomach pain.

## 2013-06-10 NOTE — Transfer of Care (Signed)
Immediate Anesthesia Transfer of Care Note  Patient: Michelle Clements  Procedure(s) Performed: Procedure(s) with comments: CARPAL TUNNEL RELEASE (Right) - Right Carpal Tunnel Release   Patient Location: PACU  Anesthesia Type:General  Level of Consciousness: awake, alert  and oriented  Airway & Oxygen Therapy: Patient Spontanous Breathing and Patient connected to nasal cannula oxygen  Post-op Assessment: Report given to PACU RN and Post -op Vital signs reviewed and stable  Post vital signs: Reviewed and stable  Complications: No apparent anesthesia complications

## 2013-06-10 NOTE — Anesthesia Postprocedure Evaluation (Signed)
Anesthesia Post Note  Patient: Michelle Clements  Procedure(s) Performed: Procedure(s) (LRB): CARPAL TUNNEL RELEASE (Right)  Anesthesia type: general  Patient location: PACU  Post pain: Pain level controlled  Post assessment: Patient's Cardiovascular Status Stable  Last Vitals:  Filed Vitals:   06/10/13 0923  BP: 87/46  Pulse: 76  Temp: 36.1 C  Resp: 18    Post vital signs: Reviewed and stable  Level of consciousness: sedated  Complications: No apparent anesthesia complications

## 2013-06-11 ENCOUNTER — Encounter (HOSPITAL_COMMUNITY): Payer: Self-pay | Admitting: Neurosurgery

## 2013-07-18 ENCOUNTER — Ambulatory Visit (HOSPITAL_COMMUNITY): Payer: Self-pay | Admitting: Psychiatry

## 2013-07-18 ENCOUNTER — Ambulatory Visit (INDEPENDENT_AMBULATORY_CARE_PROVIDER_SITE_OTHER): Payer: No Typology Code available for payment source | Admitting: Psychiatry

## 2013-07-18 ENCOUNTER — Encounter (HOSPITAL_COMMUNITY): Payer: Self-pay | Admitting: Psychiatry

## 2013-07-18 VITALS — BP 150/90 | Ht 71.0 in | Wt 250.0 lb

## 2013-07-18 DIAGNOSIS — F3160 Bipolar disorder, current episode mixed, unspecified: Secondary | ICD-10-CM

## 2013-07-18 DIAGNOSIS — F329 Major depressive disorder, single episode, unspecified: Secondary | ICD-10-CM

## 2013-07-18 MED ORDER — GABAPENTIN 600 MG PO TABS: ORAL_TABLET | ORAL | Status: DC

## 2013-07-18 MED ORDER — DULOXETINE HCL 60 MG PO CPEP: 60.0000 mg | ORAL_CAPSULE | Freq: Two times a day (BID) | ORAL | Status: DC

## 2013-07-18 MED ORDER — AMITRIPTYLINE HCL 25 MG PO TABS: 25.0000 mg | ORAL_TABLET | Freq: Every day | ORAL | Status: DC

## 2013-07-18 MED ORDER — HYDROXYZINE HCL 50 MG PO TABS: 50.0000 mg | ORAL_TABLET | Freq: Three times a day (TID) | ORAL | Status: DC | PRN
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
## 2013-09-03 ENCOUNTER — Encounter (HOSPITAL_COMMUNITY): Payer: Self-pay | Admitting: Psychiatry

## 2013-09-03 ENCOUNTER — Ambulatory Visit (INDEPENDENT_AMBULATORY_CARE_PROVIDER_SITE_OTHER): Payer: No Typology Code available for payment source | Admitting: Psychiatry

## 2013-09-03 VITALS — BP 140/92 | Ht 71.0 in | Wt 260.0 lb

## 2013-09-03 DIAGNOSIS — F3160 Bipolar disorder, current episode mixed, unspecified: Secondary | ICD-10-CM

## 2013-09-03 DIAGNOSIS — F329 Major depressive disorder, single episode, unspecified: Secondary | ICD-10-CM

## 2013-09-03 MED ORDER — GABAPENTIN 600 MG PO TABS: 600.0000 mg | ORAL_TABLET | Freq: Four times a day (QID) | ORAL | Status: DC

## 2013-09-03 MED ORDER — AMITRIPTYLINE HCL 25 MG PO TABS: 25.0000 mg | ORAL_TABLET | Freq: Four times a day (QID) | ORAL | Status: DC
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
## 2013-09-07 ENCOUNTER — Other Ambulatory Visit (HOSPITAL_COMMUNITY): Payer: Self-pay | Admitting: Psychiatry

## 2013-09-12 ENCOUNTER — Other Ambulatory Visit (HOSPITAL_COMMUNITY): Payer: Self-pay | Admitting: Psychiatry

## 2013-09-13 ENCOUNTER — Telehealth (HOSPITAL_COMMUNITY): Payer: Self-pay | Admitting: Psychiatry

## 2013-09-13 NOTE — Telephone Encounter (Signed)
Pharmacy called--Amitriptiline 25 mg 4 at bedtime #120, 3 rfs

## 2013-09-23 ENCOUNTER — Telehealth (HOSPITAL_COMMUNITY): Payer: Self-pay | Admitting: Psychiatry

## 2013-09-23 NOTE — Telephone Encounter (Signed)
Clonazepam 1 mg #60 no rfs called in

## 2013-09-27 ENCOUNTER — Other Ambulatory Visit (HOSPITAL_COMMUNITY): Payer: Self-pay | Admitting: Internal Medicine

## 2013-09-27 DIAGNOSIS — R4781 Slurred speech: Secondary | ICD-10-CM

## 2013-10-01 ENCOUNTER — Encounter (HOSPITAL_COMMUNITY): Payer: Self-pay | Admitting: Psychiatry

## 2013-10-01 ENCOUNTER — Ambulatory Visit (HOSPITAL_COMMUNITY)
Admission: RE | Admit: 2013-10-01 | Discharge: 2013-10-01 | Disposition: A | Discharge status: Home / Self Care | Payer: Medicare Other | Source: Ambulatory Visit | Attending: Internal Medicine | Admitting: Internal Medicine

## 2013-10-01 ENCOUNTER — Telehealth (HOSPITAL_COMMUNITY): Payer: Self-pay | Admitting: *Deleted

## 2013-10-01 ENCOUNTER — Ambulatory Visit (INDEPENDENT_AMBULATORY_CARE_PROVIDER_SITE_OTHER): Payer: No Typology Code available for payment source | Admitting: Psychiatry

## 2013-10-01 VITALS — BP 132/78 | Ht 71.0 in | Wt 263.0 lb

## 2013-10-01 DIAGNOSIS — F3160 Bipolar disorder, current episode mixed, unspecified: Secondary | ICD-10-CM

## 2013-10-01 DIAGNOSIS — R4789 Other speech disturbances: Secondary | ICD-10-CM | POA: Insufficient documentation

## 2013-10-01 DIAGNOSIS — R51 Headache: Secondary | ICD-10-CM | POA: Insufficient documentation

## 2013-10-01 DIAGNOSIS — R4781 Slurred speech: Secondary | ICD-10-CM

## 2013-10-01 DIAGNOSIS — F29 Unspecified psychosis not due to a substance or known physiological condition: Secondary | ICD-10-CM | POA: Insufficient documentation

## 2013-10-01 DIAGNOSIS — F329 Major depressive disorder, single episode, unspecified: Secondary | ICD-10-CM

## 2013-10-01 MED ORDER — GABAPENTIN 600 MG PO TABS: ORAL_TABLET | ORAL | Status: DC

## 2013-10-01 MED ORDER — AMITRIPTYLINE HCL 100 MG PO TABS: 100.0000 mg | ORAL_TABLET | Freq: Every day | ORAL | Status: DC

## 2013-10-01 MED ORDER — HYDROXYZINE HCL 50 MG PO TABS: 50.0000 mg | ORAL_TABLET | Freq: Three times a day (TID) | ORAL | Status: DC | PRN

## 2013-10-01 NOTE — Telephone Encounter (Signed)
Gabapentin 600 mg bid
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
## 2013-10-10 ENCOUNTER — Other Ambulatory Visit (HOSPITAL_COMMUNITY): Payer: Self-pay | Admitting: Internal Medicine

## 2013-10-10 DIAGNOSIS — I639 Cerebral infarction, unspecified: Secondary | ICD-10-CM

## 2013-10-14 ENCOUNTER — Encounter: Payer: Self-pay | Admitting: *Deleted

## 2013-10-15 ENCOUNTER — Telehealth (HOSPITAL_COMMUNITY): Payer: Self-pay | Admitting: Psychiatry

## 2013-10-15 ENCOUNTER — Ambulatory Visit (INDEPENDENT_AMBULATORY_CARE_PROVIDER_SITE_OTHER): Payer: Medicare Other | Admitting: Cardiology

## 2013-10-15 ENCOUNTER — Encounter: Payer: Self-pay | Admitting: Cardiology

## 2013-10-15 VITALS — BP 104/60 | HR 92 | Ht 71.0 in | Wt 260.0 lb

## 2013-10-15 DIAGNOSIS — R002 Palpitations: Secondary | ICD-10-CM

## 2013-10-15 DIAGNOSIS — E782 Mixed hyperlipidemia: Secondary | ICD-10-CM

## 2013-10-15 DIAGNOSIS — Z8673 Personal history of transient ischemic attack (TIA), and cerebral infarction without residual deficits: Secondary | ICD-10-CM | POA: Insufficient documentation

## 2013-10-15 DIAGNOSIS — I10 Essential (primary) hypertension: Secondary | ICD-10-CM

## 2013-10-15 NOTE — Progress Notes (Signed)
Clinical Summary Michelle Clements is a medically complex 47 y.o.female referred for cardiology consultation by Michelle Clements. Recent records reviewed. She reports a long-standing history of palpitations for years, no diagnosis of arrhythmia over time. Reportedly more a cardiac monitor about 7 years ago that was unremarkable. Just recently, she was diagnosed with a stroke by Michelle Clements, having presented with reported slurred speech and altered mental status. Head CT in October showed question of recent infarct in the superior right corona radiata, no evidence of hemorrhage or mass effect. Additional studies are pending including carotid Dopplers and brain MRI. She is also to be evaluated by a neurologist.   Lab work in September revealed hemoglobin 15.4, platelets 325, cholesterol 207, triglycerides 233, HDL 43, LDL 117, hemoglobin A1c 6.3. ECG from July 2014 showed normal sinus rhythm. Chest x-ray from July 2014 reported no acute findings.  ECG today reviewed showing sinus rhythm with borderline prolonged QTc, otherwise normal.  She describes a brief "fluttering" sensation in her chest, no precipitant, no prolonged episodes. Also occasionally has atypical chest pain when she is emotionally upset. No exertional symptoms with regularity. No previous echocardiogram.   Allergies  Allergen Reactions  . Sulfonamide Derivatives Anaphylaxis     : Angioedema  . Clonazepam     Caused nconfusion    Current Outpatient Prescriptions  Medication Sig Dispense Refill  . acetaminophen (TYLENOL) 500 MG tablet Take 1,000 mg by mouth every 6 (six) hours as needed for pain or fever.      Marland Kitchen albuterol (ACCUNEB) 0.63 MG/3ML nebulizer solution Take 3 mLs (0.63 mg total) by nebulization every 6 (six) hours as needed for wheezing or shortness of breath.  75 mL  0  . albuterol (PROVENTIL HFA;VENTOLIN HFA) 108 (90 BASE) MCG/ACT inhaler Inhale 2 puffs into the lungs every 6 (six) hours as needed for wheezing or shortness of breath.       Marland Kitchen amitriptyline (ELAVIL) 100 MG tablet Take 1 tablet (100 mg total) by mouth at bedtime.  30 tablet  2  . cyclobenzaprine (FLEXERIL) 10 MG tablet Take 10 mg by mouth every 6 (six) hours as needed for muscle spasms.      . diclofenac (VOLTAREN) 75 MG EC tablet Take 75 mg by mouth 2 (two) times daily.      . DULoxetine (CYMBALTA) 20 MG capsule Take 20 mg by mouth daily.      . Ferrous Sulfate (IRON) 325 (65 FE) MG TABS Take 32.5 mg by mouth daily.      Marland Kitchen gabapentin (NEURONTIN) 600 MG tablet Take two tablets twice a day  120 tablet  2  . hydrOXYzine (ATARAX/VISTARIL) 50 MG tablet Take 1 tablet (50 mg total) by mouth 3 (three) times daily as needed for anxiety.  90 tablet  1  . losartan (COZAAR) 25 MG tablet       . meloxicam (MOBIC) 15 MG tablet Take 15 mg by mouth daily.      . metFORMIN (GLUCOPHAGE) 500 MG tablet Take 500 mg by mouth 2 (two) times daily with a meal.      . olmesartan (BENICAR) 20 MG tablet Take 20 mg by mouth daily.      Marland Kitchen omeprazole (PRILOSEC) 20 MG capsule       . pravastatin (PRAVACHOL) 40 MG tablet Take 40 mg by mouth daily.      . traMADol (ULTRAM) 50 MG tablet Take 50 mg by mouth.      . [DISCONTINUED] buPROPion (WELLBUTRIN SR) 150 MG  12 hr tablet Take 1 tablet (150 mg total) by mouth 2 (two) times daily.  60 tablet  1   No current facility-administered medications for this visit.    Past Medical History  Diagnosis Date  . Bipolar disorder   . Hip pain, left   . Essential hypertension, benign   . Neck pain   . Allergic rhinitis   . Fibromyalgia   . Constipation   . IBS (irritable bowel syndrome)   . Hyperlipidemia   . GERD (gastroesophageal reflux disease)   . Depression   . Asthma   . PTSD (post-traumatic stress disorder)   . Compulsive behavior disorder   . Type 2 diabetes mellitus   . Sleep apnea     Wears CPAP at bedtime  . History of pneumonia 1988  . Poor short term memory   . Urgency of urination   . Arthritis     Lower back and hips  .  Anemia   . History of palpitations     Negative Holter monitor    Past Surgical History  Procedure Laterality Date  . Cholecystectomy    . Tubal ligation    . Muscle biopsy  2008    Right leg  . Carpal tunnel release Right 06/10/2013    Procedure: CARPAL TUNNEL RELEASE;  Surgeon: Reinaldo Meeker, MD;  Location: [facility];  Service: Neurosurgery;  Laterality: Right;  Right Carpal Tunnel Release     Family History  Problem Relation Age of Onset  . Fibromyalgia Mother   . Diabetes type II Mother   . Depression Mother   . Alzheimer's disease Mother   . GER disease Mother   . Heart disease Mother   . Paranoid behavior Mother   . Dementia Mother   . Heart attack Father 31    MI x5  . Stroke Father     CVA x7  . Asthma Father   . Brain cancer Father   . Seizures Father   . Anxiety disorder Sister   . OCD Sister   . Sexual abuse Sister   . Physical abuse Sister   . Anxiety disorder Sister   . Alcohol abuse Neg Hx   . Drug abuse Neg Hx   . Schizophrenia Neg Hx   . ADD / ADHD Daughter   . ADD / ADHD Daughter     Social History Michelle Clements reports that she quit smoking about 2 years ago. Her smoking use included Cigarettes. She smoked 0.00 packs per day. She has never used smokeless tobacco. Michelle Clements reports that she does not drink alcohol.  Review of Systems Patient reports problems with depression and anxiety over the years. Also pain due to fibromyalgia. She reports compliance with CPAP. No sudden dizziness or syncope. Occasional reflux symptoms. Difficulty with short-term memory. No reported bleeding problems. Otherwise negative.  Physical Examination Filed Vitals:   10/15/13 0851  BP: 104/60  Pulse: 92   Filed Weights   10/15/13 0851  Weight: 260 lb (117.935 kg)   Obese woman, appears comfortable at rest. HEENT: Conjunctiva and lids normal, oropharynx clear with moist mucosa. Neck: Supple, no elevated JVP or carotid bruits, no thyromegaly. Lungs: Clear to  auscultation, nonlabored breathing at rest. Cardiac: Regular rate and rhythm, no S3 or significant systolic murmur, no pericardial rub. Abdomen: Soft, nontender, bowel sounds present, no guarding or rebound. Extremities: No pitting edema, distal pulses 2+. Skin: Warm and dry. Musculoskeletal: No kyphosis. Neuropsychiatric: Alert and oriented x3, no gross motor  deficit , speech pattern normal, affect grossly appropriate.   Problem List and Plan   Palpitations Long-standing history for years, no documentation of arrhythmia over time. ECG shows normal sinus rhythm with borderline prolonged QTc. In light of recent stroke by head CT, reported daily symptoms of palpitations, we will obtain a screening 48 hour Holter monitor, mainly to exclude atrial arrhythmias. If she is evaluated by Neurology and a longer-term duration monitoring is requested, this can be arranged. An echocardiogram will also be obtained with bubble study to assess cardiac structure and function, exclude PFO.  Essential hypertension, benign Blood pressure is normal today, followed by Michelle Clements.  Mixed hyperlipidemia Recent LDL 117, on Pravachol. Would consider either advancing Pravachol dose, or consider switching to more potent statin to achieve LDL under 100 with documented stroke.  History of stroke in prior 3 months Recently documented by head CT as noted above. Further testing is pending per Michelle Clements, includes carotid Dopplers and brain MRI with subsequent referral to neurology. As noted above, we will obtain a cardiac monitor with reported daily palpitations, also echocardiogram with bubble study.    Jonelle Sidle, M.D., F.A.C.C.

## 2013-10-15 NOTE — Assessment & Plan Note (Signed)
Recent LDL 117, on Pravachol. Would consider either advancing Pravachol dose, or consider switching to more potent statin to achieve LDL under 100 with documented stroke.

## 2013-10-15 NOTE — Patient Instructions (Addendum)
Your physician recommends that you schedule a follow-up appointment in: TO BE DETERMINED  Your physician has recommended that you wear a holter monitor. Holter monitors are medical devices that record the heart's electrical activity. Doctors most often use these monitors to diagnose arrhythmias. Arrhythmias are problems with the speed or rhythm of the heartbeat. The monitor is a small, portable device. You can wear one while you do your normal daily activities. This is usually used to diagnose what is causing palpitations/syncope (passing out).  Your physician has requested that you have an echocardiogram. Echocardiography is a painless test that uses sound waves to create images of your heart. It provides your doctor with information about the size and shape of your heart and how well your heart's chambers and valves are working. This procedure takes approximately one hour. There are no restrictions for this procedure.  WE WILL CALL YOU WITH YOUR TEST RESULTS/INSTRUCTIONS/NEXT STEPS ONCE RECEIVED BY THE PROVIDER

## 2013-10-15 NOTE — Assessment & Plan Note (Signed)
Recently documented by head CT as noted above. Further testing is pending per Dr. Margo Aye, includes carotid Dopplers and brain MRI with subsequent referral to neurology. As noted above, we will obtain a cardiac monitor with reported daily palpitations, also echocardiogram with bubble study.

## 2013-10-15 NOTE — Assessment & Plan Note (Signed)
Blood pressure is normal today, followed by Dr. Margo Aye.

## 2013-10-15 NOTE — Assessment & Plan Note (Signed)
Long-standing history for years, no documentation of arrhythmia over time. ECG shows normal sinus rhythm with borderline prolonged QTc. In light of recent stroke by head CT, reported daily symptoms of palpitations, we will obtain a screening 48 hour Holter monitor, mainly to exclude atrial arrhythmias. If she is evaluated by Neurology and a longer-term duration monitoring is requested, this can be arranged. An echocardiogram will also be obtained with bubble study to assess cardiac structure and function, exclude PFO.

## 2013-10-16 ENCOUNTER — Ambulatory Visit (HOSPITAL_COMMUNITY)
Admission: RE | Admit: 2013-10-16 | Discharge: 2013-10-16 | Disposition: A | Discharge status: Home / Self Care | Payer: Medicare Other | Source: Ambulatory Visit | Attending: Internal Medicine | Admitting: Internal Medicine

## 2013-10-16 ENCOUNTER — Encounter (HOSPITAL_COMMUNITY): Payer: Self-pay

## 2013-10-16 DIAGNOSIS — E785 Hyperlipidemia, unspecified: Secondary | ICD-10-CM | POA: Insufficient documentation

## 2013-10-16 DIAGNOSIS — E119 Type 2 diabetes mellitus without complications: Secondary | ICD-10-CM | POA: Insufficient documentation

## 2013-10-16 DIAGNOSIS — I10 Essential (primary) hypertension: Secondary | ICD-10-CM | POA: Insufficient documentation

## 2013-10-16 DIAGNOSIS — Z87891 Personal history of nicotine dependence: Secondary | ICD-10-CM | POA: Insufficient documentation

## 2013-10-16 DIAGNOSIS — I639 Cerebral infarction, unspecified: Secondary | ICD-10-CM

## 2013-10-16 DIAGNOSIS — I6350 Cerebral infarction due to unspecified occlusion or stenosis of unspecified cerebral artery: Secondary | ICD-10-CM | POA: Insufficient documentation

## 2013-10-16 DIAGNOSIS — R4789 Other speech disturbances: Secondary | ICD-10-CM | POA: Insufficient documentation

## 2013-10-16 DIAGNOSIS — I6529 Occlusion and stenosis of unspecified carotid artery: Secondary | ICD-10-CM | POA: Insufficient documentation

## 2013-10-16 DIAGNOSIS — R51 Headache: Secondary | ICD-10-CM | POA: Insufficient documentation

## 2013-10-16 DIAGNOSIS — H538 Other visual disturbances: Secondary | ICD-10-CM | POA: Insufficient documentation

## 2013-10-16 DIAGNOSIS — F29 Unspecified psychosis not due to a substance or known physiological condition: Secondary | ICD-10-CM | POA: Insufficient documentation

## 2013-10-21 ENCOUNTER — Ambulatory Visit (HOSPITAL_COMMUNITY): Payer: Self-pay | Admitting: Psychiatry

## 2013-10-23 ENCOUNTER — Ambulatory Visit (HOSPITAL_COMMUNITY)
Admission: RE | Admit: 2013-10-23 | Discharge: 2013-10-23 | Disposition: A | Discharge status: Home / Self Care | Payer: Medicare Other | Source: Ambulatory Visit | Attending: Cardiology | Admitting: Cardiology

## 2013-10-23 ENCOUNTER — Ambulatory Visit (HOSPITAL_COMMUNITY): Admission: RE | Admit: 2013-10-23 | Payer: Medicare Other | Source: Ambulatory Visit

## 2013-10-23 DIAGNOSIS — Z8673 Personal history of transient ischemic attack (TIA), and cerebral infarction without residual deficits: Secondary | ICD-10-CM

## 2013-10-23 DIAGNOSIS — R002 Palpitations: Secondary | ICD-10-CM | POA: Insufficient documentation

## 2013-10-23 NOTE — Progress Notes (Signed)
48 hour Holter Monitor in progress. Ends @ 2pm on 10-25-13

## 2013-10-29 ENCOUNTER — Other Ambulatory Visit (HOSPITAL_COMMUNITY): Payer: Self-pay | Admitting: Psychiatry

## 2013-11-06 ENCOUNTER — Ambulatory Visit (INDEPENDENT_AMBULATORY_CARE_PROVIDER_SITE_OTHER): Payer: Medicare Other | Admitting: Neurology

## 2013-11-06 ENCOUNTER — Encounter (INDEPENDENT_AMBULATORY_CARE_PROVIDER_SITE_OTHER): Payer: Self-pay

## 2013-11-06 ENCOUNTER — Encounter: Payer: Self-pay | Admitting: Neurology

## 2013-11-06 VITALS — BP 135/79 | HR 91 | Ht 71.0 in | Wt 254.0 lb

## 2013-11-06 DIAGNOSIS — E1149 Type 2 diabetes mellitus with other diabetic neurological complication: Secondary | ICD-10-CM

## 2013-11-06 DIAGNOSIS — E782 Mixed hyperlipidemia: Secondary | ICD-10-CM

## 2013-11-06 DIAGNOSIS — R51 Headache: Secondary | ICD-10-CM

## 2013-11-06 DIAGNOSIS — R519 Headache, unspecified: Secondary | ICD-10-CM | POA: Insufficient documentation

## 2013-11-06 DIAGNOSIS — E7849 Other hyperlipidemia: Secondary | ICD-10-CM

## 2013-11-06 DIAGNOSIS — M542 Cervicalgia: Secondary | ICD-10-CM | POA: Insufficient documentation

## 2013-11-06 DIAGNOSIS — F05 Delirium due to known physiological condition: Secondary | ICD-10-CM

## 2013-11-06 MED ORDER — TOPIRAMATE 50 MG PO TABS: 50.0000 mg | ORAL_TABLET | Freq: Two times a day (BID) | ORAL | Status: DC

## 2013-11-06 NOTE — Patient Instructions (Signed)
Recommend EEG to evaluate for seizure activity and did not take Klonopin again in her lifetime has her confusion may have been a medication reaction. Check MRI scan of the cervical spine for neck pain evaluation. Trial of Topamax 50 mg at night into one week to be increased to twice daily if tolerated to help with headaches and neck pain. I advised her to follow up with Dr. Tenny Craw her psychiatrist to discuss alternative medications to Sinemet and I will continue it she can no longer afford. Return for followup in 6 weeks or call earlier if necessary

## 2013-11-06 NOTE — Progress Notes (Signed)
[facility] [address] [place]. [place] [address] [phone]       OFFICE CONSULT NOTE  [name] Date of Birth:  04-Mar-1966 Medical Record Number:  [id number]   Referring MD:  Dwana Melena, MD  Reason for Referral:  confusion  HPI: 52 year lady with transient episode of confusion, slurred speech,disorientation on 09/25/13. She had been feeling drowsy and tired since the day prior after staring klonopin for anxiety. She was outside and called family to bring her home and when husband arrived she was confused disoriented and unable to connect her thoughts and not remember recent events. She had some slurred speech but symptoms improved quickly. She did not have clear memory of events. 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 The headaches are mild bifrontal 4/10in severity accompanied by mild nausea and light,sound sensitivity  And relieved by lying down and increased with exertion.She has no p/h/o TIA, stroke, seizures or significant head injury with loss of consciousness.  ROS:   14 system review of systems is positive for weight loss,fatigue,blurred vision,itching,vertigo,trouble swallowing,aching muscles,feeling hot,flushing,memory loss,confusion,headache,weakness,slurred speech,dizziness,depression,anxiety,racing thoughts,insomnia,sleepiness,snoring  PMH:  Past Medical History  Diagnosis Date  . Bipolar disorder   . Hip pain, left   . Essential hypertension, benign   . Neck pain   . Allergic rhinitis   . Fibromyalgia   . Constipation   . IBS (irritable bowel syndrome)   . Hyperlipidemia   . GERD (gastroesophageal reflux disease)   . Depression   . Asthma   . PTSD (post-traumatic stress disorder)   . Compulsive behavior disorder   . Type  2 diabetes mellitus   . Sleep apnea     Wears CPAP at bedtime  . History of pneumonia 1988  . Poor short term memory   . Urgency of urination   . Arthritis     Lower back and hips  . Anemia   . History of palpitations     Negative Holter monitor   Family history negative for neurological problems Social History:  History   Social History  . Marital Status: Married    Spouse Name: N/A    Number of Children: 2  . Years of Education: college   Occupational History  . 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 Smoking status: Former Smoker    Types: Cigarettes    Quit date: 11/04/2010  . Smokeless tobacco: Never Used  . Alcohol Use: No  . Drug Use: No  . Sexual Activity: Yes   Other Topics Concern  . Not on file   Social History Narrative   Married   Lives with spouse and daughter    Medications:   Current Outpatient Prescriptions on File Prior to Visit  Medication Sig Dispense Refill  . acetaminophen (TYLENOL) 500 MG tablet Take 1,000 mg by mouth every 6 (six) hours as needed for pain or fever.      Marland Kitchen albuterol (PROVENTIL HFA;VENTOLIN HFA) 108 (90 BASE) MCG/ACT inhaler Inhale 2 puffs into the lungs every 6 (six) hours as needed for wheezing or shortness of breath.      . cyclobenzaprine (FLEXERIL) 10 MG tablet Take 10 mg by mouth every 6 (six)  hours as needed for muscle spasms.      . hydrOXYzine (ATARAX/VISTARIL) 50 MG tablet Take 1 tablet (50 mg total) by mouth 3 (three) times daily as needed for anxiety.  90 tablet  1  . losartan (COZAAR) 25 MG tablet       . metFORMIN (GLUCOPHAGE) 500 MG tablet Take 500 mg by mouth 2 (two) times daily with a meal.      . omeprazole (PRILOSEC) 20 MG capsule       . traMADol (ULTRAM) 50 MG tablet Take 50 mg by mouth.      . [DISCONTINUED] buPROPion (WELLBUTRIN SR) 150 MG 12 hr tablet Take 1 tablet (150 mg total) by mouth 2 (two) times daily.  60 tablet  1    No current facility-administered medications on file prior to visit.    Allergies:   Allergies  Allergen Reactions  . Sulfonamide Derivatives Anaphylaxis     : Angioedema  . Clonazepam     Caused nconfusion    Physical Exam General: well developed, well nourished, seated, in no evident distress Head: head normocephalic and atraumatic. Orohparynx benign Neck: supple with no carotid or supraclavicular bruits Cardiovascular: regular rate and rhythm, no murmurs Musculoskeletal: no deformity Skin:  no rash/petichiae Vascular:  Normal pulses all extremities Filed Vitals:   11/06/13 1320  BP: 135/79  Pulse: 91    Neurologic Exam Mental Status: Awake and fully alert. Oriented to place and time. Recent and remote memory intact. Attention span, concentration and fund of knowledge appropriate. Mood and affect appropriate. MMSE 30/30. Clock Drawing 4/4. AFT 14. Cranial Nerves: Fundoscopic exam reveals sharp disc margins. Pupils equal, briskly reactive to light. Extraocular movements full without nystagmus. Visual fields full to confrontation. Hearing intact. Facial sensation intact. Face, tongue, palate moves normally and symmetrically.  Motor: Normal bulk and tone. Normal strength in all tested extremity muscles. Sensory.: intact to touch and pinprick and vibratory sensation.  Coordination: Rapid alternating movements normal in all extremities. Finger-to-nose and heel-to-shin performed accurately bilaterally. Gait and Station: Arises from chair without difficulty. Stance is normal. Gait demonstrates normal stride length and balance . Able to heel, toe and tandem walk without difficulty.  Reflexes: 1+ and symmetric. Toes downgo   ASSESSMENT: 47 year lady with transient episode of confusion and disorientation of unclear etiology-medication effect from klonopin versus unwitnessed seizure with postictal confusion    PLAN: Recommend EEG to evaluate for seizure activity and did not take  Klonopin again in her lifetime has her confusion may have been a medication reaction. Check MRI scan of the cervical spine for neck pain evaluation. Trial of Topamax 50 mg at night into one week to be increased to twice daily if tolerated to help with headaches and neck pain. I advised her to follow up with Dr. Tenny Craw her psychiatrist to discuss alternative medications to Sinemet and I will continue it she can no longer afford. Return for followup in 6 weeks or call earlier if necessary

## 2013-11-21 ENCOUNTER — Other Ambulatory Visit: Payer: Self-pay

## 2013-11-22 ENCOUNTER — Encounter (INDEPENDENT_AMBULATORY_CARE_PROVIDER_SITE_OTHER): Payer: Self-pay

## 2013-11-22 ENCOUNTER — Other Ambulatory Visit (INDEPENDENT_AMBULATORY_CARE_PROVIDER_SITE_OTHER): Payer: Medicare Other | Admitting: Radiology

## 2013-11-22 DIAGNOSIS — F05 Delirium due to known physiological condition: Secondary | ICD-10-CM

## 2013-11-26 ENCOUNTER — Ambulatory Visit (HOSPITAL_COMMUNITY): Payer: Self-pay | Admitting: Psychiatry

## 2013-12-03 ENCOUNTER — Telehealth: Payer: Self-pay | Admitting: *Deleted

## 2013-12-03 ENCOUNTER — Encounter: Payer: Self-pay | Admitting: *Deleted

## 2013-12-03 NOTE — Telephone Encounter (Signed)
FYI: noted several attempts have been made to contact the pt to re-schedule her echo, noted no response, letter mailed to pt to advise

## 2013-12-09 ENCOUNTER — Telehealth: Payer: Self-pay | Admitting: Neurology

## 2013-12-09 NOTE — Telephone Encounter (Signed)
Patient calling about EEG results from 11/21/13. Please call patient.

## 2013-12-10 ENCOUNTER — Encounter: Payer: Self-pay | Admitting: Neurology

## 2013-12-10 ENCOUNTER — Other Ambulatory Visit: Payer: Self-pay

## 2013-12-10 DIAGNOSIS — R51 Headache: Secondary | ICD-10-CM

## 2013-12-10 MED ORDER — TOPIRAMATE 50 MG PO TABS: 50.0000 mg | ORAL_TABLET | Freq: Two times a day (BID) | ORAL | Status: DC

## 2013-12-11 ENCOUNTER — Telehealth: Payer: Self-pay | Admitting: Neurology

## 2013-12-11 NOTE — Telephone Encounter (Signed)
Pt calling for EEG result I informed her it was answered through the email request she sent and she states she has questions about it

## 2013-12-11 NOTE — Telephone Encounter (Signed)
I responded to [employer] with normal EEG study, no seizures.   Pt to return call if questions.

## 2013-12-11 NOTE — Telephone Encounter (Signed)
Please advise 

## 2014-01-08 ENCOUNTER — Encounter: Payer: Self-pay | Admitting: Neurology

## 2014-01-08 ENCOUNTER — Ambulatory Visit (INDEPENDENT_AMBULATORY_CARE_PROVIDER_SITE_OTHER): Payer: Medicare HMO | Admitting: Neurology

## 2014-01-08 VITALS — BP 131/77 | HR 87 | Ht 71.0 in | Wt 237.0 lb

## 2014-01-08 DIAGNOSIS — R51 Headache: Secondary | ICD-10-CM

## 2014-01-08 MED ORDER — TOPIRAMATE 100 MG PO TABS: 100.0000 mg | ORAL_TABLET | Freq: Two times a day (BID) | ORAL | Status: DC

## 2014-01-08 NOTE — Patient Instructions (Addendum)
Increase Topamax  to 100 mg twice daily. Check MRI scan of the cervical spine to evaluate for degenerative disc disease. Return for followup in 3 months with Jeani Hawking, NP or call earlier if necessary

## 2014-01-08 NOTE — Progress Notes (Signed)
[facility] [address] [place]. [place] [address] [phone]       OFFICE Follow Up  NOTE  [name] Date of Birth:  12-08-1965 Medical Record Number:  [id number]   Referring MD:  Michelle Neighbors, MD  Reason for Referral:  confusion  HPI:  Update 01/08/14 : She returns for followup after her last visit 2 months ago. She did not undergo MRI of the cervical spine as she had a area of localized pain in her mid back and saw primary physician who tried to get a MRI of the thoracic spine along with MRI of the cervical spine at the same time but request was denied by her insurance. She complains of tingling and numbness in both hands as well as her toes which are intermittent seemed to be more brought on by sitting in a hard chair. This will last a half an hour and seems to help with activity like walking and moving around. She is currently taking Topamax 50 mg twice daily which seems to be helping somewhat and she is tolerating it well without significant side effects. She continues to have intermittent neck pain off and on. She has stopped Klonopin and in fact she has been off all antipsychotics and has not seen a psychiatrist for a long time. She had EEG done on 11/22/13 which was normal. She denies any further episodes of confusion or altered awareness or any seizure-like activity. Initial visit 11/06/2013 ;60 year lady with transient episode of confusion, slurred speech,disorientation on 09/25/13. She had been feeling drowsy and tired since the day prior after staring klonopin for anxiety. She was outside and called family to bring her home and when husband arrived she was confused disoriented and unable to connect her thoughts and not remember recent events. She had some slurred speech but symptoms improved quickly. She did not have clear memory of events. 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 The headaches are mild bifrontal 4/10in severity accompanied by mild nausea and light,sound sensitivity  And relieved by lying down and increased with exertion.She has no p/h/o TIA, stroke, seizures or significant head injury with loss of consciousness.  ROS:   14 system review of systems is positive for decreased, decreased weight, neck pain, neck stiffness, ear pain, bladder urgency, back pain, aching muscles, joint pain, daytime sleepiness and numbness and all other systems negative PMH:  Past Medical History  Diagnosis Date  . Bipolar disorder   . Hip pain, left   . Essential hypertension, benign   . Neck pain   . Allergic rhinitis   . Fibromyalgia   . Constipation   . IBS (irritable bowel syndrome)   . Hyperlipidemia   . GERD (gastroesophageal reflux disease)   . Depression   . Asthma   . PTSD (post-traumatic stress disorder)   . Compulsive behavior disorder   . Type 2 diabetes mellitus   . Sleep apnea     Wears CPAP at bedtime  . History of pneumonia 1988  . Poor short term memory   . Urgency of urination   . Arthritis     Lower back and hips  . Anemia   . History of palpitations     Negative Holter monitor   Family history negative for neurological problems  Social History:  History   Social History  . Marital Status: Married    Spouse Name: N/A    Number of Children: 2  . Years of Education: college   Occupational History  . 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 Smoking status: Former Smoker    Types: Cigarettes    Quit date: 11/04/2010  . Smokeless tobacco: Never Used  . Alcohol Use: No  . Drug Use: No  . Sexual Activity: Yes   Other Topics Concern  . Not on file   Social History Narrative   Married   Lives with spouse and daughter     Medications:   Current Outpatient Prescriptions on File Prior to Visit  Medication Sig Dispense Refill  . acetaminophen (TYLENOL) 500 MG tablet Take 1,000 mg by mouth every 6 (six) hours as needed for pain or fever.      Marland Kitchen albuterol (PROVENTIL HFA;VENTOLIN HFA) 108 (90 BASE) MCG/ACT inhaler Inhale 2 puffs into the lungs every 6 (six) hours as needed for wheezing or shortness of breath.      . cyclobenzaprine (FLEXERIL) 10 MG tablet Take 10 mg by mouth every 6 (six) hours as needed for muscle spasms.      . hydrOXYzine (ATARAX/VISTARIL) 50 MG tablet Take 1 tablet (50 mg total) by mouth 3 (three) times daily as needed for anxiety.  90 tablet  1  . metFORMIN (GLUCOPHAGE) 500 MG tablet Take 500 mg by mouth 2 (two) times daily with a meal.      . omeprazole (PRILOSEC) 20 MG capsule       . topiramate (TOPAMAX) 50 MG tablet Take 1 tablet (50 mg total) by mouth 2 (two) times daily.  180 tablet  0  . traMADol (ULTRAM) 50 MG tablet Take 50 mg by mouth.      . [DISCONTINUED] buPROPion (WELLBUTRIN SR) 150 MG 12 hr tablet Take 1 tablet (150 mg total) by mouth 2 (two) times daily.  60 tablet  1   No current facility-administered medications on file prior to visit.    Allergies:   Allergies  Allergen Reactions  . Sulfonamide Derivatives Anaphylaxis     : Angioedema  . Clonazepam     Caused nconfusion    Physical Exam General: well developed, well nourished, seated, in no evident distress Head: head normocephalic and atraumatic. Orohparynx benign Neck: supple with no carotid or supraclavicular bruits Cardiovascular: regular rate and rhythm, no murmurs Musculoskeletal: no deformity Skin:  no rash/petichiae Vascular:  Normal pulses all extremities Filed Vitals:   01/08/14 1433  BP: 131/77  Pulse: 87    Neurologic Exam Mental Status: Awake and fully alert. Oriented to place and time. Recent and remote memory intact. Attention span, concentration and fund of knowledge appropriate. Mood and  affect appropriate.   Cranial Nerves: Fundoscopic exam reveals sharp disc margins. Pupils equal, briskly reactive to light. Extraocular movements full without nystagmus. Visual fields full to confrontation. Hearing intact. Facial sensation intact. Face, tongue, palate moves normally and symmetrically.  Motor: Normal bulk and tone. Normal strength in all tested extremity muscles. Sensory.: intact to touch and pinprick and vibratory sensation.  Coordination: Rapid alternating movements normal in all extremities. Finger-to-nose and heel-to-shin performed accurately bilaterally. Gait and Station: Arises from chair without difficulty. Stance is normal. Gait demonstrates normal stride length and balance . Able to heel, toe and tandem walk without difficulty.  Reflexes: 1+ and symmetric. Toes downgo   ASSESSMENT: [age] year lady with transient episode of confusion and disorientation of unclear etiology-medication effect from klonopin versus unwitnessed seizure with postictal confusion    PLAN: Increase Topamax  to 100 mg twice daily. Check MRI scan of the cervical spine to evaluate for degenerative disc disease. Return for followup in 3 months with Michelle FifeLynn, NP or call earlier if necessary

## 2014-03-05 ENCOUNTER — Telehealth (HOSPITAL_COMMUNITY): Payer: Self-pay | Admitting: Dietician

## 2014-03-05 NOTE — Telephone Encounter (Signed)
Received voicemail left at 1130. Pt was last seen on 04/30/13. Pt requesting another appointment with RD, reporting she was told to follow a gluten free diet.

## 2014-03-05 NOTE — Telephone Encounter (Signed)
Called and left message at [phone].

## 2014-03-06 ENCOUNTER — Ambulatory Visit
Admission: RE | Admit: 2014-03-06 | Discharge: 2014-03-06 | Disposition: A | Discharge status: Home / Self Care | Payer: Medicare HMO | Source: Ambulatory Visit | Attending: Neurology | Admitting: Neurology

## 2014-03-06 DIAGNOSIS — M542 Cervicalgia: Secondary | ICD-10-CM

## 2014-03-12 ENCOUNTER — Telehealth: Payer: Self-pay | Admitting: Neurology

## 2014-03-12 NOTE — Telephone Encounter (Signed)
Pt is requesting MRI results.    Please advise.

## 2014-03-12 NOTE — Telephone Encounter (Signed)
I spoke to the patient and communicated results of MRI of the neck showing mild degenerative changes at C5-6 and C6-7 but no severe compression requiring surgery at this time. She was advised to increase her Topamax to 100 mg in the morning and 200 at night and keep scheduled appointment with Jeani Hawking

## 2014-03-12 NOTE — Telephone Encounter (Signed)
Pt called and asked if someone could call her regarding the results of the MRI she had on 03-06-14.  Please call.  Thank you.

## 2014-03-13 ENCOUNTER — Encounter: Payer: Self-pay | Admitting: Neurology

## 2014-04-23 ENCOUNTER — Other Ambulatory Visit: Payer: Self-pay

## 2014-04-23 MED ORDER — TOPIRAMATE 100 MG PO TABS: 100.0000 mg | ORAL_TABLET | Freq: Two times a day (BID) | ORAL | Status: DC

## 2014-04-25 ENCOUNTER — Encounter: Payer: Self-pay | Admitting: Nurse Practitioner

## 2014-04-25 ENCOUNTER — Ambulatory Visit (INDEPENDENT_AMBULATORY_CARE_PROVIDER_SITE_OTHER): Payer: Commercial Managed Care - HMO | Admitting: Nurse Practitioner

## 2014-04-25 VITALS — BP 127/82 | HR 82 | Ht 71.0 in | Wt 225.0 lb

## 2014-04-25 DIAGNOSIS — R51 Headache: Secondary | ICD-10-CM

## 2014-04-25 DIAGNOSIS — M542 Cervicalgia: Secondary | ICD-10-CM

## 2014-04-25 MED ORDER — TOPIRAMATE 100 MG PO TABS: ORAL_TABLET | ORAL | Status: DC

## 2014-04-25 NOTE — Progress Notes (Signed)
PATIENT: Michelle Clements DOB: 08-08-1966  REASON FOR VISIT: follow up for headache HISTORY FROM: patient  HISTORY OF PRESENT ILLNESS: Update 04/25/14 (LL):  She returns for followup after her last visit 3 months ago. Results of MRI of the neck showing mild degenerative changes at C5-6 and C6-7 but no severe compression requiring surgery at this time. She has tingling and numbness intermittently in her fingers and toes, has previous diagnosis of bilateral carpel tunnel and status-post release on right wrist.  She was advised to increase her Topamax to 100 mg in the morning and 200 at night. She is tolerating Topamax well.  Her headaches have improved. She had previously been on Gabapentin for fibromyalgia and has stopped using it; feels much better off of it. Has started back on Cymbalta.  She wants to get back to water zumba.  Update 01/08/14 : She returns for followup after her last visit 2 months ago. She did not undergo MRI of the cervical spine as she had a area of localized pain in her mid back and saw primary physician who tried to get a MRI of the thoracic spine along with MRI of the cervical spine at the same time but request was denied by her insurance. She complains of tingling and numbness in both hands as well as her toes which are intermittent seemed to be more brought on by sitting in a hard chair. This will last a half an hour and seems to help with activity like walking and moving around. She is currently taking Topamax 50 mg twice daily which seems to be helping somewhat and she is tolerating it well without significant side effects. She continues to have intermittent neck pain off and on. She has stopped Klonopin and in fact she has been off all antipsychotics and has not seen a psychiatrist for a long time. She had EEG done on 11/22/13 which was normal. She denies any further episodes of confusion or altered awareness or any seizure-like activity.   Initial visit 11/06/2013 ;43 year lady  with transient episode of confusion, slurred speech,disorientation on 09/25/13. She had been feeling drowsy and tired since the day prior after staring klonopin for anxiety. She was outside and called family to bring her home and when husband arrived she was confused disoriented and unable to connect her thoughts and not remember recent events. She had some slurred speech but symptoms improved quickly. She did not have clear memory of events. 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 The headaches are mild bifrontal 4/10in severity accompanied by mild nausea and light,sound sensitivity And relieved by lying down and increased with exertion.She has no p/h/o TIA, stroke, seizures or significant head injury with loss of consciousness.   ROS:  14 system review of systems is positive for decreased, decreased weight, neck pain, neck stiffness, ear pain, bladder urgency, back pain, aching muscles, joint pain, daytime sleepiness and numbness, constipation, env allergies, hand weakness, and all other systems negative  ALLERGIES: Allergies  Allergen Reactions  . Sulfonamide Derivatives Anaphylaxis     : Angioedema  . Clonazepam     Caused nconfusion    HOME MEDICATIONS: Outpatient Prescriptions Prior to Visit  Medication Sig Dispense Refill  . acetaminophen (TYLENOL) 500 MG tablet Take 1,000 mg by mouth every 6 (six)  hours as needed for pain or fever.      Marland Kitchen albuterol (PROVENTIL HFA;VENTOLIN HFA) 108 (90 BASE) MCG/ACT inhaler Inhale 2 puffs into the lungs every 6 (six) hours as needed for wheezing or shortness of breath.      . cyclobenzaprine (FLEXERIL) 10 MG tablet Take 10 mg by mouth every 6 (six) hours as needed for muscle spasms.      Noelle Penner IBUPROFEN 200 MG CAPS Take 200 mg by mouth as  needed.      . hydrOXYzine (ATARAX/VISTARIL) 50 MG tablet Take 1 tablet (50 mg total) by mouth 3 (three) times daily as needed for anxiety.  90 tablet  1  . omeprazole (PRILOSEC) 20 MG capsule       . pravastatin (PRAVACHOL) 40 MG tablet Take 40 mg by mouth daily.      . traMADol (ULTRAM) 50 MG tablet Take 50 mg by mouth.      . topiramate (TOPAMAX) 100 MG tablet Take 1 tablet (100 mg total) by mouth 2 (two) times daily.  180 tablet  0  . metFORMIN (GLUCOPHAGE) 500 MG tablet Take 500 mg by mouth 2 (two) times daily with a meal.       No facility-administered medications prior to visit.   PHYSICAL EXAM  Filed Vitals:   04/25/14 1333  BP: 127/82  Pulse: 82  Height: 5\' 11"  (1.803 m)  Weight: 225 lb (102.059 kg)   Body mass index is 31.39 kg/(m^2). No exam data present  Physical Exam  General: well developed, well nourished, seated, in no evident distress  Head: head normocephalic and atraumatic. Orohparynx benign  Neck: supple with no carotid or supraclavicular bruits  Cardiovascular: regular rate and rhythm, no murmurs  Musculoskeletal: no deformity  Skin: no rash/petichiae  Vascular: Normal pulses all extremities  Neurologic Exam  Mental Status: Awake and fully alert. Oriented to place and time. Recent and remote memory intact. Attention span, concentration and fund of knowledge appropriate. Mood and affect appropriate.  Cranial Nerves: Pupils equal, briskly reactive to light. Extraocular movements full without nystagmus. Visual fields full to confrontation. Hearing intact. Facial sensation intact. Face, tongue, palate moves normally and symmetrically.  Motor: Normal bulk and tone. Normal strength in all tested extremity muscles.  Sensory: intact to touch and pinprick and vibratory sensation.  Coordination: Rapid alternating movements normal in all extremities. Finger-to-nose and heel-to-shin performed accurately bilaterally.  Gait and Station: Arises from chair without  difficulty. Stance is normal. Gait demonstrates normal stride length and balance . Able to heel, toe and tandem walk without difficulty.  Reflexes: 1+ and symmetric.   ASSESSMENT AND PLAN 84 year lady with transient episode of confusion and disorientation of unclear etiology-medication effect from klonopin versus unwitnessed seizure with postictal confusion in October 2104.  Degenerative spine disease and frequent headache, improved on Topiramate.  We reviewed cervical spine MRI results and compared results to previous myelograms.  Conservative management of disc disease is recommended.  PLAN:  Continue Topiramate to 100 mg in the am and 200 mg in the pm.. Return for followup in 6 months, or call earlier if necessary  Meds ordered this encounter  . topiramate (TOPAMAX) 100 MG tablet    Sig: Take 1 tablet in the morning and 2 tablets at night.    Dispense:  270 tablet    Refill:  3    Order Specific Question:  Supervising Provider    Answer:  Garvin Fila [[id number]]   Return in about 6 months (  around 10/26/2014).  Philmore Pali, MSN, NP-C 04/25/2014, 2:25 PM [facility] [address] [place], [place] [address] [phone]  Note: This document was prepared with digital dictation and possible smart phrase technology. Any transcriptional errors that result from this process are unintentional.

## 2014-04-25 NOTE — Patient Instructions (Signed)
Continue current dose of Topamax, [facility].  Follow up in 6 months, sooner as needed.

## 2014-06-24 ENCOUNTER — Other Ambulatory Visit: Payer: Self-pay

## 2014-06-24 MED ORDER — TOPIRAMATE 100 MG PO TABS: ORAL_TABLET | ORAL | Status: DC

## 2014-07-28 ENCOUNTER — Ambulatory Visit (HOSPITAL_COMMUNITY): Payer: Self-pay

## 2014-07-29 ENCOUNTER — Encounter (HOSPITAL_COMMUNITY): Payer: Self-pay

## 2014-07-29 ENCOUNTER — Encounter (HOSPITAL_COMMUNITY): Payer: Medicare HMO | Attending: Hematology and Oncology

## 2014-07-29 VITALS — BP 133/85 | HR 82 | Temp 98.4°F | Resp 18 | Ht 70.5 in | Wt 215.9 lb

## 2014-07-29 DIAGNOSIS — M5030 Other cervical disc degeneration, unspecified cervical region: Secondary | ICD-10-CM

## 2014-07-29 DIAGNOSIS — D839 Common variable immunodeficiency, unspecified: Secondary | ICD-10-CM | POA: Insufficient documentation

## 2014-07-29 DIAGNOSIS — D809 Immunodeficiency with predominantly antibody defects, unspecified: Secondary | ICD-10-CM

## 2014-07-29 DIAGNOSIS — K219 Gastro-esophageal reflux disease without esophagitis: Secondary | ICD-10-CM

## 2014-07-29 DIAGNOSIS — J3089 Other allergic rhinitis: Secondary | ICD-10-CM

## 2014-07-29 NOTE — Progress Notes (Signed)
Visit deferred for 4 weeks.

## 2014-07-29 NOTE — Progress Notes (Signed)
this encounter was created in error - please disregard.

## 2014-08-27 ENCOUNTER — Encounter (HOSPITAL_COMMUNITY): Payer: Self-pay

## 2014-08-27 ENCOUNTER — Encounter (HOSPITAL_COMMUNITY): Payer: Medicare HMO | Attending: Hematology and Oncology

## 2014-08-27 ENCOUNTER — Encounter (HOSPITAL_BASED_OUTPATIENT_CLINIC_OR_DEPARTMENT_OTHER): Payer: Medicare HMO

## 2014-08-27 ENCOUNTER — Encounter (HOSPITAL_COMMUNITY): Payer: Self-pay | Admitting: Lab

## 2014-08-27 VITALS — BP 116/67 | HR 72 | Temp 98.2°F | Resp 18 | Wt 209.2 lb

## 2014-08-27 DIAGNOSIS — D809 Immunodeficiency with predominantly antibody defects, unspecified: Secondary | ICD-10-CM | POA: Insufficient documentation

## 2014-08-27 DIAGNOSIS — D839 Common variable immunodeficiency, unspecified: Secondary | ICD-10-CM | POA: Insufficient documentation

## 2014-08-27 DIAGNOSIS — G609 Hereditary and idiopathic neuropathy, unspecified: Secondary | ICD-10-CM

## 2014-08-27 DIAGNOSIS — K219 Gastro-esophageal reflux disease without esophagitis: Secondary | ICD-10-CM

## 2014-08-27 DIAGNOSIS — M5030 Other cervical disc degeneration, unspecified cervical region: Secondary | ICD-10-CM

## 2014-08-27 LAB — COMPREHENSIVE METABOLIC PANEL
ALBUMIN: 4 g/dL (ref 3.5–5.2)
ALT: 18 U/L (ref 0–35)
ANION GAP: 12 (ref 5–15)
AST: 17 U/L (ref 0–37)
Alkaline Phosphatase: 51 U/L (ref 39–117)
BILIRUBIN TOTAL: 0.4 mg/dL (ref 0.3–1.2)
BUN: 8 mg/dL (ref 6–23)
CO2: 22 mEq/L (ref 19–32)
CREATININE: 0.94 mg/dL (ref 0.50–1.10)
Calcium: 9.7 mg/dL (ref 8.4–10.5)
Chloride: 108 mEq/L (ref 96–112)
GFR calc non Af Amer: 72 mL/min — ABNORMAL LOW (ref >90)
GFR, EST AFRICAN AMERICAN: 83 mL/min — AB (ref >90)
Glucose, Bld: 114 mg/dL — ABNORMAL HIGH (ref 70–99)
Potassium: 3.9 mEq/L (ref 3.7–5.3)
Sodium: 142 mEq/L (ref 137–147)
Total Protein: 6.9 g/dL (ref 6.0–8.3)

## 2014-08-27 LAB — CBC WITH DIFFERENTIAL/PLATELET
Basophils Absolute: 0.1 10*3/uL (ref 0.0–0.1)
Basophils Relative: 1 % (ref 0–1)
Eosinophils Absolute: 0.3 10*3/uL (ref 0.0–0.7)
Eosinophils Relative: 4 % (ref 0–5)
HEMATOCRIT: 38.3 % (ref 36.0–46.0)
HEMOGLOBIN: 13.9 g/dL (ref 12.0–15.0)
Lymphocytes Relative: 34 % (ref 12–46)
Lymphs Abs: 2.5 10*3/uL (ref 0.7–4.0)
MCH: 32.8 pg (ref 26.0–34.0)
MCHC: 36.3 g/dL — AB (ref 30.0–36.0)
MCV: 90.3 fL (ref 78.0–100.0)
MONO ABS: 0.4 10*3/uL (ref 0.1–1.0)
MONOS PCT: 6 % (ref 3–12)
NEUTROS ABS: 4.2 10*3/uL (ref 1.7–7.7)
Neutrophils Relative %: 55 % (ref 43–77)
Platelets: 301 10*3/uL (ref 150–400)
RBC: 4.24 MIL/uL (ref 3.87–5.11)
RDW: 12.3 % (ref 11.5–15.5)
WBC: 7.4 10*3/uL (ref 4.0–10.5)

## 2014-08-27 LAB — RETICULOCYTES
RBC.: 4.24 MIL/uL (ref 3.87–5.11)
RETIC COUNT ABSOLUTE: 72.1 10*3/uL (ref 19.0–186.0)
Retic Ct Pct: 1.7 % (ref 0.4–3.1)

## 2014-08-27 LAB — FOLATE: Folate: >20 ng/mL

## 2014-08-27 LAB — C-REACTIVE PROTEIN: CRP: <0.5 mg/dL — ABNORMAL LOW (ref <0.60)

## 2014-08-27 LAB — VITAMIN B12: Vitamin B-12: 1142 pg/mL — ABNORMAL HIGH (ref 211–911)

## 2014-08-27 NOTE — Patient Instructions (Signed)
[facility] Discharge Instructions  RECOMMENDATIONS MADE BY THE CONSULTANT AND ANY TEST RESULTS WILL BE SENT TO YOUR REFERRING PHYSICIAN.  EXAM FINDINGS BY THE PHYSICIAN TODAY AND SIGNS OR SYMPTOMS TO REPORT TO CLINIC OR PRIMARY PHYSICIAN: You saw Dr Barnet Glasgow today  We are going to send you to see allergy and immunology doctor at [facility] Dr Achilles Dunk.  Her number is [phone].  You will have CT scans and follow up in 1 month with the doctor.  Please call the clinic with any questions or concerns  Thank you for choosing [facility] to provide your oncology and hematology care.  To afford each patient quality time with our providers, please arrive at least 15 minutes before your scheduled appointment time.  With your help, our goal is to use those 15 minutes to complete the necessary work-up to ensure our physicians have the information they need to help with your evaluation and healthcare recommendations.    Effective January 1st, 2014, we ask that you re-schedule your appointment with our physicians should you arrive 10 or more minutes late for your appointment.  We strive to give you quality time with our providers, and arriving late affects you and other patients whose appointments are after yours.    Again, thank you for choosing [facility].  Our hope is that these requests will decrease the amount of time that you wait before being seen by our physicians.       _____________________________________________________________  Should you have questions after your visit to [facility], please contact our office at [phone] between the hours of 8:30 a.m. and 5:00 p.m.  Voicemails left after 4:30 p.m. will not be returned until the following business day.  For prescription refill requests, have your pharmacy contact our office with your prescription refill request.

## 2014-08-27 NOTE — Progress Notes (Signed)
[facility] A. Barnet Glasgow, M.D.  NEW PATIENT EVALUATION   Name: Michelle Clements Date: 08/27/2014 MRN: [id number] DOB: 1966-04-14  PCP: Delphina Cahill, MD   REFERRING PHYSICIAN: Delphina Cahill, MD  REASON FOR REFERRAL: Low IgG level.     HISTORY OF PRESENT ILLNESS:Michelle Clements is a 48 y.o. female who is referred by her family physician after discovery of a low IgG level. The patient has frequent sinus problems with hoarseness, cough, and asthma symptoms. Previously she was treated for pustular tonsillitis. She also has gluten sensitivity with constipation when she adheres to a low gluten diet. She has occasional painful rectal bleeding without significant abdominal distention, nausea, or vomiting. She does experience spring and fall allergies. She's had generalized joint arthralgias without swelling or redness. She's also had urinary frequency with nocturia for many years. She had been evaluated by rheumatologist in 2011 at which time IgM, IgG, and IgA levels were all low. Her sedimentation rate was normal at that time and she underwent a muscle biopsy which was negative for myositis. 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 She denies easy satiety, skin rash, migraine syndrome, vaginal bleeding, hematuria, but does suffer with peripheral paresthesias requiring therapy with gabapentin. She has been diagnosed with degenerative disease of the cervical spine by MRI and is HIV negative. Her last chest x-ray was in 2008 in conjunction with an asthmatic attack. She has been evaluated in the past by psychiatry and labeled as "bipolar disorder."   PAST MEDICAL HISTORY:  has a past medical history of Bipolar disorder; Hip pain, left; Essential hypertension,  benign; Neck pain; Allergic rhinitis; Fibromyalgia; Constipation; IBS (irritable bowel syndrome); Hyperlipidemia; GERD (gastroesophageal reflux disease); Depression; Asthma; PTSD (post-traumatic stress disorder); Compulsive behavior disorder; Type 2 diabetes mellitus; Sleep apnea; History of pneumonia (1988); Poor short term memory; Urgency of urination; Arthritis; Anemia; and History of palpitations.     PAST SURGICAL HISTORY: Past Surgical History  Procedure Laterality Date  . Cholecystectomy    . Tubal ligation    . Muscle biopsy  2008    Right leg  . Carpal tunnel release Right 06/10/2013    Procedure: CARPAL TUNNEL RELEASE;  Surgeon: Faythe Ghee, MD;  Location: [facility];  Service: Neurosurgery;  Laterality: Right;  Right Carpal Tunnel Release   . Mole exc      x 3     CURRENT MEDICATIONS: has a current medication list which includes the following prescription(s): albuterol, co-enzyme q-10, duloxetine, eq ibuprofen, fluticasone, hydroxyzine, onetouch ultrasoft, loratadine, melatonin, fish oil, one touch ultra test, senna-docusate, simethicone, tizanidine, topiramate, tramadol, acetaminophen, cetirizine, cyclobenzaprine, omeprazole, and pravastatin.   ALLERGIES: Sulfonamide derivatives and Clonazepam   SOCIAL HISTORY:  reports that she quit smoking about 3 years ago. Her smoking use included Cigarettes. She smoked 0.00 packs per day. She has never used smokeless tobacco. She reports that she does not drink alcohol or use illicit drugs.   FAMILY HISTORY: family history includes ADD / ADHD in her daughter and daughter; Alzheimer's disease in her mother; Anxiety disorder in her sister and sister; Asthma in her father; Brain  cancer in her father; Dementia in her mother; Depression in her mother; Diabetes type II in her mother; Fibromyalgia in her mother; GER disease in her mother; Heart attack (age of onset: 58) in her father; Heart disease in her mother; OCD in her sister; Paranoid  behavior in her mother; Physical abuse in her sister; Seizures in her father; Sexual abuse in her sister; Stroke in her father. There is no history of Alcohol abuse, Drug abuse, or Schizophrenia.    REVIEW OF SYSTEMS:  Other than that discussed above is noncontributory.    PHYSICAL EXAM:  weight is 209 lb 3.2 oz (94.892 kg). Her oral temperature is 98.2 F (36.8 C). Her blood pressure is 116/67 and her pulse is 72. Her respiration is 18 and oxygen saturation is 100%.    GENERAL:alert, no distress and comfortable SKIN: skin color, texture, turgor are normal, no rashes or significant lesions EYES: normal, Conjunctiva are pink and non-injected, sclera clear OROPHARYNX:no exudate, no erythema and lips, buccal mucosa, and tongue normal  NECK: supple, thyroid normal size, non-tender, without nodularity CHEST: Normal AP diameter with no breast masses. LYMPH:  no palpable lymphadenopathy in the cervical, axillary or inguinal LUNGS: clear to auscultation and percussion with normal breathing effort HEART: regular rate & rhythm and no murmurs. No S3 ABDOMEN:abdomen soft, non-tender and normal bowel sounds. Liver and spleen not palpable. MUSCULOSKELETALl:no cyanosis of digits, no clubbing or edema. Generalized muscle tenderness. NEURO: alert & oriented x 3 with fluent speech, no focal motor/sensory deficits    LABORATORY DATA:  From referring physician's office: 07/02/2014: WBC 8.9, hemoglobin 14.9, platelets 371,000 with ANC 5.8                   BUN 11, creatinine 0.91, alkaline phosphatase 46, hemoglobin A1c 5.4%, TSH 2.313, PT 26.6,                             Calcium 10.2                     IgG 575, IgA 65, IgM 24    Office Visit on 08/27/2014  Component Date Value Ref Range Status  . Retic Ct Pct 08/27/2014 1.7  0.4 - 3.1 % Final  . RBC. 08/27/2014 4.24  3.87 - 5.11 MIL/uL Final  . Retic Count, Manual 08/27/2014 72.1  19.0 - 186.0 K/uL Final  . WBC 08/27/2014 7.4  4.0 - 10.5 K/uL Final   . RBC 08/27/2014 4.24  3.87 - 5.11 MIL/uL Final  . Hemoglobin 08/27/2014 13.9  12.0 - 15.0 g/dL Final  . HCT 08/27/2014 38.3  36.0 - 46.0 % Final  . MCV 08/27/2014 90.3  78.0 - 100.0 fL Final  . MCH 08/27/2014 32.8  26.0 - 34.0 pg Final  . MCHC 08/27/2014 36.3* 30.0 - 36.0 g/dL Final  . RDW 08/27/2014 12.3  11.5 - 15.5 % Final  . Platelets 08/27/2014 301  150 - 400 K/uL Final  . Neutrophils Relative % 08/27/2014 55  43 - 77 % Final  . Neutro Abs 08/27/2014 4.2  1.7 - 7.7 K/uL Final  . Lymphocytes Relative 08/27/2014 34  12 - 46 % Final  . Lymphs Abs 08/27/2014 2.5  0.7 - 4.0 K/uL Final  . Monocytes Relative 08/27/2014 6  3 - 12 % Final  . Monocytes Absolute 08/27/2014 0.4  0.1 - 1.0 K/uL Final  . Eosinophils Relative 08/27/2014 4  0 - 5 %  Final  . Eosinophils Absolute 08/27/2014 0.3  0.0 - 0.7 K/uL Final  . Basophils Relative 08/27/2014 1  0 - 1 % Final  . Basophils Absolute 08/27/2014 0.1  0.0 - 0.1 K/uL Final  . Sodium 08/27/2014 142  137 - 147 mEq/L Final  . Potassium 08/27/2014 3.9  3.7 - 5.3 mEq/L Final  . Chloride 08/27/2014 108  96 - 112 mEq/L Final  . CO2 08/27/2014 22  19 - 32 mEq/L Final  . Glucose, Bld 08/27/2014 114* 70 - 99 mg/dL Final  . BUN 08/27/2014 8  6 - 23 mg/dL Final  . Creatinine, Ser 08/27/2014 0.94  0.50 - 1.10 mg/dL Final  . Calcium 08/27/2014 9.7  8.4 - 10.5 mg/dL Final  . Total Protein 08/27/2014 6.9  6.0 - 8.3 g/dL Final  . Albumin 08/27/2014 4.0  3.5 - 5.2 g/dL Final  . AST 08/27/2014 17  0 - 37 U/L Final  . ALT 08/27/2014 18  0 - 35 U/L Final  . Alkaline Phosphatase 08/27/2014 51  39 - 117 U/L Final  . Total Bilirubin 08/27/2014 0.4  0.3 - 1.2 mg/dL Final  . GFR calc non Af Amer 08/27/2014 72* >90 mL/min Final  . GFR calc Af Amer 08/27/2014 83* >90 mL/min Final   Comment: (NOTE)                          The eGFR has been calculated using the CKD EPI equation.                          This calculation has not been validated in all clinical  situations.                          eGFR's persistently <90 mL/min signify possible Chronic Kidney                          Disease.  . Anion gap 08/27/2014 12  5 - 15 Final      _0 : No results found.  PATHOLOGY: None.   IMPRESSION:  #1. 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 Type 2 diabetes mellitus, non-insulin requiring, controlled. #3. Gluten enteropathy.   PLAN:  #1. Additional laboratory tests were done today to assess hematologic status including immunophenotyping of  T cells as well as peripheral blood flow cytometry for lymphoproliferative disorder, CRP, SPEP, serum immunofixation, H. pylori antibody, ANA, B12, folate level, light chains, and IgG subclass analysis. #2. CT neck, chest, abdomen, and pelvis with contrast looking for underlying lymphoma, bronchiectasis, splenomegaly. #3. Referral to Dr. Achilles Dunk at [facility] who is a specialist in the management of this disorder. A strategy of management is sought which can in part be carried out here at the [facility] if in fact the diagnosis corroborated. #4. Withhold influenza virus vaccine at this time until evaluation at [facility] anticipating anergy evaluation.   Doroteo Bradford, MD 08/27/2014 10:53 AM   DISCLAIMER:  This note was dictated with voice recognition softwre.  Similar sounding words can inadvertently be transcribed inaccurately and may not be corrected upon review.

## 2014-08-27 NOTE — Progress Notes (Signed)
Referral made to Dr Lloyd Huger at [facility].  Appointment is Oct. 14.  Patient is aware of appointment.

## 2014-08-27 NOTE — Progress Notes (Signed)
LABS FOR ANA,B12,CBCD,CMP,CRPP,FOL,HPYLAR,KLLC,RET,SIFX,SPE,FLOW CYTOMETRY

## 2014-08-28 LAB — ANA: ANA: NEGATIVE

## 2014-08-28 LAB — HELICOBACTER PYLORI ABS-IGG+IGA, BLD
H Pylori IgA: 0.5 U/mL (ref <9.0)
H Pylori IgG: 0.42 {ISR}

## 2014-08-29 LAB — PROTEIN ELECTROPHORESIS, SERUM
ALPHA-2-GLOBULIN: 10.3 % (ref 7.1–11.8)
Albumin ELP: 68.5 % — ABNORMAL HIGH (ref 55.8–66.1)
Alpha-1-Globulin: 4.2 % (ref 2.9–4.9)
Beta 2: 4.1 % (ref 3.2–6.5)
Beta Globulin: 4.7 % (ref 4.7–7.2)
Gamma Globulin: 8.2 % — ABNORMAL LOW (ref 11.1–18.8)
M-Spike, %: NOT DETECTED g/dL
TOTAL PROTEIN ELP: 6.3 g/dL (ref 6.0–8.3)

## 2014-08-29 LAB — IMMUNOFIXATION ELECTROPHORESIS
IGG (IMMUNOGLOBIN G), SERUM: 538 mg/dL — AB (ref 690–1700)
IgA: 58 mg/dL — ABNORMAL LOW (ref 69–380)
IgM, Serum: 24 mg/dL — ABNORMAL LOW (ref 52–322)
Total Protein ELP: 6.7 g/dL (ref 6.0–8.3)

## 2014-08-29 LAB — KAPPA/LAMBDA LIGHT CHAINS
KAPPA, LAMDA LIGHT CHAIN RATIO: 0.98 (ref 0.26–1.65)
Kappa free light chain: 0.98 mg/dL (ref 0.33–1.94)
Lambda free light chains: 1 mg/dL (ref 0.57–2.63)

## 2014-09-03 ENCOUNTER — Ambulatory Visit (HOSPITAL_COMMUNITY)
Admission: RE | Admit: 2014-09-03 | Discharge: 2014-09-03 | Disposition: A | Discharge status: Home / Self Care | Payer: Medicare HMO | Source: Ambulatory Visit | Attending: Hematology and Oncology | Admitting: Hematology and Oncology

## 2014-09-03 ENCOUNTER — Encounter (HOSPITAL_COMMUNITY): Payer: Self-pay

## 2014-09-03 DIAGNOSIS — R1310 Dysphagia, unspecified: Secondary | ICD-10-CM | POA: Insufficient documentation

## 2014-09-03 DIAGNOSIS — R634 Abnormal weight loss: Secondary | ICD-10-CM | POA: Insufficient documentation

## 2014-09-03 DIAGNOSIS — R079 Chest pain, unspecified: Secondary | ICD-10-CM | POA: Insufficient documentation

## 2014-09-03 DIAGNOSIS — D839 Common variable immunodeficiency, unspecified: Secondary | ICD-10-CM

## 2014-09-03 DIAGNOSIS — R0602 Shortness of breath: Secondary | ICD-10-CM | POA: Insufficient documentation

## 2014-09-03 MED ORDER — IOHEXOL 300 MG/ML  SOLN
150.0000 mL | Freq: Once | INTRAMUSCULAR | Status: AC | PRN
  Administered 2014-09-03: 150 mL via INTRAVENOUS

## 2014-09-26 ENCOUNTER — Encounter (HOSPITAL_COMMUNITY): Payer: Medicare HMO | Attending: Hematology and Oncology

## 2014-09-26 ENCOUNTER — Encounter (HOSPITAL_COMMUNITY): Payer: Self-pay

## 2014-09-26 DIAGNOSIS — D839 Common variable immunodeficiency, unspecified: Secondary | ICD-10-CM | POA: Insufficient documentation

## 2014-09-26 NOTE — Progress Notes (Signed)
Hart, Ruby, MD  [place] [address]  DIAGNOSIS: Common variable immunodeficiency - Plan: CBC with Differential, Comprehensive metabolic panel, Immunofixation electrophoresis  Chief Complaint  Patient presents with  . Follow-up    CURRENT THERAPY: Watchful expectation. Referral to Dr.Lugar at [facility].  INTERVAL HISTORY: Michelle Clements 48 y.o. 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 Extensive workup was done which failed to reveal evidence of a lymphoproliferative disorder, primary bone marrow disorder, H. pylori infection,  or collagen vascular disorder. She has developed sinus symptoms with yellow-green nasal expectoration after receiving influenza virus vaccine 8 days ago. She went back in research when she needed to be on prednisone for nasal symptoms and apparently occurred at this time if your stools in the early spring. [facility] the [facility] and additional lab tests are pending. She denies any diarrhea, constipation, lower 70 swelling or redness, skin rash, fever, or night sweats.  MEDICAL HISTORY: Past Medical History  Diagnosis Date  . Bipolar disorder   . Hip pain, left   . Essential hypertension, benign   . Neck pain   . Allergic rhinitis   . Fibromyalgia   . Constipation   . IBS (irritable bowel syndrome)   . Hyperlipidemia   . GERD (gastroesophageal reflux disease)   . Depression   . Asthma   . PTSD (post-traumatic stress disorder)   . Compulsive behavior disorder   . Type 2 diabetes mellitus   . Sleep apnea     Wears CPAP at bedtime  . History of pneumonia 1988  . Poor short term memory   . Urgency of urination   . Arthritis     Lower back and  hips  . Anemia   . History of palpitations     Negative Holter monitor    INTERIM HISTORY: has Unspecified vitamin D deficiency; Mixed hyperlipidemia; Morbid obesity; BIPOLAR AFFECTIVE DISORDER; Hx of tobacco use, presenting hazards to health; DEPRESSION; Essential hypertension, benign; IBS; FIBROMYALGIA; Palpitations; Neuropathy of upper extremity; History of stroke in prior 3 months; Headache(784.0); Cervicalgia; Acute confusional state; and Common variable immunodeficiency on her problem list.    ALLERGIES:  is allergic to sulfonamide derivatives and clonazepam.  MEDICATIONS: has a current medication list which includes the following prescription(s): acetaminophen, albuterol, co-enzyme q-10, duloxetine, eq ibuprofen, fluticasone, onetouch ultrasoft, loratadine, fish oil, omeprazole, one touch ultra test, simethicone, topiramate, tramadol, cetirizine, cyclobenzaprine, melatonin, pravastatin, senna-docusate, and tizanidine.  SURGICAL HISTORY:  Past Surgical History  Procedure Laterality Date  . Cholecystectomy    . Tubal ligation    . Muscle biopsy  2008    Right leg  . Carpal tunnel release Right 06/10/2013    Procedure: CARPAL TUNNEL RELEASE;  Surgeon: Faythe Ghee, MD;  Location: [facility];  Service: Neurosurgery;  Laterality: Right;  Right Carpal Tunnel Release   . Mole exc      x 3    FAMILY HISTORY: family history includes ADD / ADHD in her daughter and daughter; Alzheimer's disease in her mother; Anxiety disorder in her sister and sister; Asthma in her father; Brain cancer in her father; Dementia in her mother; Depression in her mother; Diabetes type II in her mother; Fibromyalgia in her mother; GER disease in her mother; Heart  attack (age of onset: 38) in her father; Heart disease in her mother; OCD in her sister; Paranoid behavior in her mother; Physical abuse in her sister; Seizures in her father; Sexual abuse in her sister; Stroke in her father. There is no history of  Alcohol abuse, Drug abuse, or Schizophrenia.  SOCIAL HISTORY:  reports that she quit smoking about 3 years ago. Her smoking use included Cigarettes. She smoked 0.00 packs per day. She has never used smokeless tobacco. She reports that she does not drink alcohol or use illicit drugs.  REVIEW OF SYSTEMS:  Other than that discussed above is noncontributory.  PHYSICAL EXAMINATION: ECOG PERFORMANCE STATUS: 1 - Symptomatic but completely ambulatory  There were no vitals taken for this visit.  GENERAL:alert, no distress and comfortable SKIN: skin color, texture, turgor are normal, no rashes or significant lesions EYES: PERLA; Conjunctiva are pink and non-injected, sclera clear SINUSES: No redness or tenderness over maxillary or ethmoid sinuses. . Minimal rhinorrhea. OROPHARYNX:no exudate, no erythema on lips, buccal mucosa, or tongue. NECK: supple, thyroid normal size, non-tender, without nodularity. No masses CHEST: No breast masses. LYMPH:  no palpable lymphadenopathy in the cervical, axillary or inguinal LUNGS: clear to auscultation and percussion with normal breathing effort HEART: regular rate & rhythm and no murmurs. ABDOMEN:abdomen soft, non-tender and normal bowel sounds. Liver and spleen not palpable. MUSCULOSKELETAL:no cyanosis of digits and no clubbing. Range of motion normal.  NEURO: alert & oriented x 3 with fluent speech, no focal motor/sensory deficits   LABORATORY DATA: No visits with results within 30 Day(s) from this visit. Latest known visit with results is:  Office Visit on 08/27/2014  Component Date Value Ref Range Status  . ANA 08/27/2014 NEGATIVE  NEGATIVE Final   Performed at [facility]  . CRP 08/27/2014 <0.5* <0.60 mg/dL Final   Performed at [facility]  . Vitamin B-12 08/27/2014 1142* 211 - 911 pg/mL Final   Performed at [facility]  . Folate 08/27/2014 >20.0   Final   Comment: (NOTE)                          Reference Ranges                                  Deficient:       0.4 - 3.3 ng/mL                                 Indeterminate:   3.4 - 5.4 ng/mL                                 Normal:              > 5.4 ng/mL                          Performed at [facility]  . Retic Ct Pct 08/27/2014 1.7  0.4 - 3.1 % Final  . RBC. 08/27/2014 4.24  3.87 - 5.11 MIL/uL Final  . Retic Count, Manual 08/27/2014 72.1  19.0 - 186.0 K/uL Final  . WBC 08/27/2014 7.4  4.0 - 10.5 K/uL Final  . RBC 08/27/2014 4.24  3.87 - 5.11 MIL/uL Final  . Hemoglobin  08/27/2014 13.9  12.0 - 15.0 g/dL Final  . HCT 08/27/2014 38.3  36.0 - 46.0 % Final  . MCV 08/27/2014 90.3  78.0 - 100.0 fL Final  . MCH 08/27/2014 32.8  26.0 - 34.0 pg Final  . MCHC 08/27/2014 36.3* 30.0 - 36.0 g/dL Final  . RDW 08/27/2014 12.3  11.5 - 15.5 % Final  . Platelets 08/27/2014 301  150 - 400 K/uL Final  . Neutrophils Relative % 08/27/2014 55  43 - 77 % Final  . Neutro Abs 08/27/2014 4.2  1.7 - 7.7 K/uL Final  . Lymphocytes Relative 08/27/2014 34  12 - 46 % Final  . Lymphs Abs 08/27/2014 2.5  0.7 - 4.0 K/uL Final  . Monocytes Relative 08/27/2014 6  3 - 12 % Final  . Monocytes Absolute 08/27/2014 0.4  0.1 - 1.0 K/uL Final  . Eosinophils Relative 08/27/2014 4  0 - 5 % Final  . Eosinophils Absolute 08/27/2014 0.3  0.0 - 0.7 K/uL Final  . Basophils Relative 08/27/2014 1  0 - 1 % Final  . Basophils Absolute 08/27/2014 0.1  0.0 - 0.1 K/uL Final  . Sodium 08/27/2014 142  137 - 147 mEq/L Final  . Potassium 08/27/2014 3.9  3.7 - 5.3 mEq/L Final  . Chloride 08/27/2014 108  96 - 112 mEq/L Final  . CO2 08/27/2014 22  19 - 32 mEq/L Final  . Glucose, Bld 08/27/2014 114* 70 - 99 mg/dL Final  . BUN 08/27/2014 8  6 - 23 mg/dL Final  . Creatinine, Ser 08/27/2014 0.94  0.50 - 1.10 mg/dL Final  . Calcium 08/27/2014 9.7  8.4 - 10.5 mg/dL Final  . Total Protein 08/27/2014 6.9  6.0 - 8.3 g/dL Final  . Albumin 08/27/2014 4.0  3.5 - 5.2 g/dL Final  . AST 08/27/2014 17  0 - 37 U/L  Final  . ALT 08/27/2014 18  0 - 35 U/L Final  . Alkaline Phosphatase 08/27/2014 51  39 - 117 U/L Final  . Total Bilirubin 08/27/2014 0.4  0.3 - 1.2 mg/dL Final  . GFR calc non Af Amer 08/27/2014 72* >90 mL/min Final  . GFR calc Af Amer 08/27/2014 83* >90 mL/min Final   Comment: (NOTE)                          The eGFR has been calculated using the CKD EPI equation.                          This calculation has not been validated in all clinical situations.                          eGFR's persistently <90 mL/min signify possible Chronic Kidney                          Disease.  . Anion gap 08/27/2014 12  5 - 15 Final  . Total Protein ELP 08/27/2014 6.3  6.0 - 8.3 g/dL Final  . Albumin ELP 08/27/2014 68.5* 55.8 - 66.1 % Final  . Alpha-1-Globulin 08/27/2014 4.2  2.9 - 4.9 % Final  . Alpha-2-Globulin 08/27/2014 10.3  7.1 - 11.8 % Final  . Beta Globulin 08/27/2014 4.7  4.7 - 7.2 % Final  . Beta 2 08/27/2014 4.1  3.2 - 6.5 % Final  . Gamma Globulin 08/27/2014 8.2* 11.1 - 18.8 %  Final  . M-Spike, % 08/27/2014 NOT DETECTED   Final  . SPE Interp. 08/27/2014 (NOTE)   Final   Comment: Nonspecific pattern associated with slightly decreased gamma                          globulins.                          Results are consistent with SPE performed on 08/19/2009                           Reviewed by Odis Hollingshead, MD, PhD, FCAP (Electronic Signature on                          File)  . Comment 08/27/2014 (NOTE)   Final   Comment: ---------------                          Serum protein electrophoresis is a useful screening procedure in the                          detection of various pathophysiologic states such as inflammation,                          gammopathies, protein loss and other dysproteinemias.  Immunofixation                          electrophoresis (IFE) is a more sensitive technique for the                          identification of M-proteins found in patients with monoclonal                           gammopathy of unknown significance (MGUS), amyloidosis, early or                          treated myeloma or macroglobulinemia, solitary plasmacytoma or                          extramedullary plasmacytoma.                          Performed at [facility]  . Total Protein ELP 08/27/2014 6.7  6.0 - 8.3 g/dL Final  . IgG (Immunoglobin G), Serum 08/27/2014 538* 690 - 1700 mg/dL Final  . IgA 08/27/2014 58* 69 - 380 mg/dL Final  . IgM, Serum 08/27/2014 24* 52 - 322 mg/dL Final  . Immunofix Electr Int 08/27/2014 (NOTE)   Final   Comment: No monoclonal protein identified.                          Reviewed by Odis Hollingshead, MD, PhD, FCAP (Electronic Signature on                          File)  Performed at [facility]  . Kappa free light chain 08/27/2014 0.98  0.33 - 1.94 mg/dL Final  . Lamda free light chains 08/27/2014 1.00  0.57 - 2.63 mg/dL Final  . Kappa, lamda light chain ratio 08/27/2014 0.98  0.26 - 1.65 Final   Performed at [facility]  . H Pylori IgA 08/27/2014 0.5  <9.0 U/mL Final   Comment: (NOTE)                          Result        Interpretation                          ---------     -----------------------------------------------                          <9.0          Negative                          9.0-11.0      Equivocal                          >11.0         Positive                          H. pylori IgA antibody results should be interpreted in conjunction                          with H. pylori IgG for detection of antibodies to H. pylori.  . H Pylori IgG 08/27/2014 0.42   Final   Comment: (NOTE)                          No significant level of IgG antibody to H. pylori detected.                            ISR = Immune Status Ratio                                      <0.90         ISR       Negative                                      0.90 - 1.09   ISR       Equivocal                                       >=1.10        ISR       Positive                          The above results were obtained with the Immulite 2000 H. pylori IgG  EIA.  Results obtained from other manufacturer's assay methods may not                          be used interchangeably.                          Performed at [facility]:  for RIDHIMA, GOLBERG ([id number]) Patient: CARNELIA, OSCAR Collected: 08/27/2014 Client: [facility] Accession: [id number] Received: 08/27/2014 Farrel Gobble, MD DOB: [birth year] Age: 47 Gender: F Reported: 08/28/2014 [address] Patient Ph: [phone] MRN #: [id number] Linna Hoff [place] [id number] Visit #: [id number] Chart #: Phone: Fax: CC: FLOW CYTOMETRY REPORT INTERPRETATION Interpretation Peripheral Blood Flow Cytometry NO MONOCLONAL B CELL POPULATION OR ABNORMAL T CELL PHENOTYPE IDENTIFIED. Susanne Greenhouse MD Pathologist, Electronic Signature (Case signed 08/28/2014) GROSS AND MICROSCOPIC INFORMATION Source Peripheral Blood Flow Cytometry Microscopic Gated population: Flow cytometric immunophenotyping is performed using antibiodies to the antigens listed in the table below. Electronic gates are placed around a cell cluster displaying light scatter properties corresponding to lymphocytes. - Abnormal Cells in gated population: NA - Phenotype of Abnormal Cells: NA 1 of 2 FINAL for TERRIAH, REGGIO ([id number]) Specimen Table Lymphoid Associated Myeloid Associated Misc. As CD2 tested CD19 tested CD11c ND CD45 ND CD3 tested CD20 tested CD13 ND HLA-DR tested CD4 tested CD21 tested CD14 ND CD10 tested CD5 tested CD22 tested CD15 ND CD56/16 ND CD7 tested CD23 tested CD33 ND ZAP70 ND CD8 tested CD103 ND MPO ND CD34 ND CD25 ND FMC7 ND CD117 ND CD52 ND sKappa tested CD38 ND sLambda tested cKappa ND cLambda ND Gross Received from [facility] are two lavender tubes labeled as PBS to evaluate for immunodeficiency. All controls  stained appropriately. The above tests were developed and their performance characteristics determined by the [place]. They have not been cleared or approved by the U.S. Food and Drug Administration." Report signed from the following location(s) Interpretation performed at [facility], [address], [place] [address]. CLIA #: [id number],   Urinalysis    Component Value Date/Time   COLORURINE YELLOW 10/13/2012 [place] 10/13/2012 1821   LABSPEC 1.018 10/13/2012 1821   PHURINE 6.5 10/13/2012 1821   GLUCOSEU NEGATIVE 10/13/2012 1821   HGBUR MODERATE* 10/13/2012 1821   HGBUR negative 06/03/2008 0902   BILIRUBINUR NEGATIVE 10/13/2012 1821   KETONESUR NEGATIVE 10/13/2012 1821   PROTEINUR NEGATIVE 10/13/2012 1821   UROBILINOGEN 1.0 10/13/2012 1821   NITRITE NEGATIVE 10/13/2012 1821   LEUKOCYTESUR TRACE* 10/13/2012 1821    RADIOGRAPHIC STUDIES: Ct Soft Tissue Neck W Contrast  09/03/2014   CLINICAL DATA:  Common variable immunodeficiency.  Suspect lymphoma.  EXAM: CT NECK WITH CONTRAST  TECHNIQUE: Multidetector CT imaging of the neck was performed using the standard protocol following the bolus administration of intravenous contrast.  CONTRAST:  170m OMNIPAQUE IOHEXOL 300 MG/ML  SOLN  COMPARISON:  MRI of the cervical spine 03/06/2014. CT myelogram of the cervical spine 06/05/2012.  FINDINGS: Limited imaging of the brain is unremarkable.  Suprahyoid neck demonstrates no focal mucosal or submucosal lesion. The vallecula is clear. The epiglottis is within normal limits.  The larynx is unremarkable. Vocal cords are midline and symmetric. No focal mucosal or submucosal lesions are present.  The infraglottic neck demonstrates normal appearance of the thyroid. The thoracic inlet is within normal limits. Superior mediastinum is unremarkable.  Benign  appearing level 2 lymph nodes are present bilaterally. There are no pathologically enlarged nodes for nodes  suspicious for lymphoma.  Endplate degenerative changes are most evident at C5-6 and C6-7. Uncovertebral spurring is present bilaterally at C5-6. There straightening of the normal cervical lordosis.  The lung apices are clear.  IMPRESSION: 1. No significant cervical adenopathy to suggest lymphoma. 2. No primary mucosal or submucosal lesion. 3. Mild degenerative changes of the cervical spine are similar to prior studies, most evident at C5-6 and C6-7.   Electronically Signed   By: Lawrence Santiago M.D.   On: 09/03/2014 13:43   Ct Chest W Contrast  09/03/2014   : CLINICAL DATA:  Unintentional weight loss. Suspect lymphoma. Chest pain, hoarseness, loss of voice. Difficulty swallowing. Shortness of Breath.  EXAM: CT CHEST, ABDOMEN, AND PELVIS WITH CONTRAST  TECHNIQUE: Multidetector CT imaging of the chest, abdomen and pelvis was performed following the standard protocol during bolus administration of intravenous contrast.  COMPARISON:  CT abdomen and pelvis 01/11/2013.  CONTRAST:  115m OMNIPAQUE IOHEXOL 300 MG/ML  SOLN  FINDINGS: CT CHEST FINDINGS  Lungs are clear. No focal airspace opacities or suspicious nodules. No effusions. Heart is normal size. Aorta is normal caliber. No mediastinal, hilar, or axillary adenopathy. Chest wall soft tissues are unremarkable.  CT ABDOMEN AND PELVIS FINDINGS  Prior cholecystectomy. Liver, spleen, pancreas, adrenals and kidneys are unremarkable.  Stomach, large and small bowel unremarkable. Uterus, adnexae and urinary bladder normal. No free fluid, free air or adenopathy. Aorta is normal caliber.  No acute bony abnormality or focal bone lesion.  IMPRESSION: No acute findings or significant abnormality in the chest, abdomen or pelvis.   Electronically Signed   By: KRolm BaptiseM.D.   On: 09/03/2014 10:35   Ct Abdomen Pelvis W Contrast  09/03/2014   : CLINICAL DATA:  Unintentional weight loss. Suspect lymphoma. Chest pain, hoarseness, loss of voice. Difficulty swallowing. Shortness  of Breath.  EXAM: CT CHEST, ABDOMEN, AND PELVIS WITH CONTRAST  TECHNIQUE: Multidetector CT imaging of the chest, abdomen and pelvis was performed following the standard protocol during bolus administration of intravenous contrast.  COMPARISON:  CT abdomen and pelvis 01/11/2013.  CONTRAST:  1527mOMNIPAQUE IOHEXOL 300 MG/ML  SOLN  FINDINGS: CT CHEST FINDINGS  Lungs are clear. No focal airspace opacities or suspicious nodules. No effusions. Heart is normal size. Aorta is normal caliber. No mediastinal, hilar, or axillary adenopathy. Chest wall soft tissues are unremarkable.  CT ABDOMEN AND PELVIS FINDINGS  Prior cholecystectomy. Liver, spleen, pancreas, adrenals and kidneys are unremarkable.  Stomach, large and small bowel unremarkable. Uterus, adnexae and urinary bladder normal. No free fluid, free air or adenopathy. Aorta is normal caliber.  No acute bony abnormality or focal bone lesion.  IMPRESSION: No acute findings or significant abnormality in the chest, abdomen or pelvis.   Electronically Signed   By: KeRolm Baptise.D.   On: 09/03/2014 10:35    ASSESSMENT:  #1. 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 Type 2 diabetes mellitus, non-insulin requiring, controlled.  #3. Gluten enteropathy.  #4. Exacerbation of allergic rhinitis.      PLAN:  #1. Nettie pot every night followed by Flonase inhaler and daily Claritin. #[facility] they can be provided locally. #3. Followup in 6 weeks with no labs   All questions were answered. The patient knows to call the clinic with any problems,  questions or concerns. We can certainly see the patient much sooner if necessary.   I spent 30 minutes counseling the patient face to face. The total time spent in the appointment was 40 minutes.    Doroteo Bradford, MD 09/26/2014  10:03 AM  DISCLAIMER:  This note was dictated with voice recognition software.  Similar sounding words can inadvertently be transcribed inaccurately and may not be corrected upon review.

## 2014-09-26 NOTE — Patient Instructions (Signed)
.  [facility] Discharge Instructions  RECOMMENDATIONS MADE BY THE CONSULTANT AND ANY TEST RESULTS WILL BE SENT TO YOUR REFERRING PHYSICIAN.  EXAM FINDINGS BY THE PHYSICIAN TODAY AND SIGNS OR SYMPTOMS TO REPORT TO CLINIC OR PRIMARY PHYSICIAN: Exam and findings as discussed by Dr Barnet Glasgow.  MEDICATIONS PRESCRIBED:  Use your Nedi- pot  INSTRUCTIONS/FOLLOW-UP: 6 weeks  Thank you for choosing [facility] to provide your oncology and hematology care.  To afford each patient quality time with our providers, please arrive at least 15 minutes before your scheduled appointment time.  With your help, our goal is to use those 15 minutes to complete the necessary work-up to ensure our physicians have the information they need to help with your evaluation and healthcare recommendations.    Effective January 1st, 2014, we ask that you re-schedule your appointment with our physicians should you arrive 10 or more minutes late for your appointment.  We strive to give you quality time with our providers, and arriving late affects you and other patients whose appointments are after yours.    Again, thank you for choosing [facility].  Our hope is that these requests will decrease the amount of time that you wait before being seen by our physicians.       _____________________________________________________________  Should you have questions after your visit to [facility], please contact our office at [phone] between the hours of 8:30 a.m. and 4:30 p.m.  Voicemails left after 4:30 p.m. will not be returned until the following business day.  For prescription refill requests, have your pharmacy contact our office with your prescription refill request.    _______________________________________________________________  We hope that we have given you very good care.  You may receive a patient satisfaction survey in the mail, please complete it and  return it as soon as possible.  We value your feedback!  _______________________________________________________________  Have you asked about our STAR program?  STAR stands for Survivorship Training and Rehabilitation, and this is a nationally recognized cancer care program that focuses on survivorship and rehabilitation.  Cancer and cancer treatments may cause problems, such as, pain, making you feel tired and keeping you from doing the things that you need or want to do. Cancer rehabilitation can help. Our goal is to reduce these troubling effects and help you have the best quality of life possible.  You may receive a survey from a nurse that asks questions about your current state of health.  Based on the survey results, all eligible patients will be referred to the [facility] program for an evaluation so we can better serve you!  A frequently asked questions sheet is available upon request.

## 2014-09-29 ENCOUNTER — Telehealth (HOSPITAL_COMMUNITY): Payer: Self-pay | Admitting: Hematology and Oncology

## 2014-09-29 NOTE — Telephone Encounter (Signed)
Faxed consult note/labs to [facility] per their request. [facility] [phone]

## 2014-10-10 ENCOUNTER — Other Ambulatory Visit (HOSPITAL_COMMUNITY): Payer: Self-pay | Admitting: Internal Medicine

## 2014-10-10 DIAGNOSIS — Z139 Encounter for screening, unspecified: Secondary | ICD-10-CM

## 2014-10-16 ENCOUNTER — Ambulatory Visit (HOSPITAL_COMMUNITY)
Admission: RE | Admit: 2014-10-16 | Discharge: 2014-10-16 | Disposition: A | Discharge status: Home / Self Care | Payer: Medicare HMO | Source: Ambulatory Visit | Attending: Internal Medicine | Admitting: Internal Medicine

## 2014-10-16 DIAGNOSIS — Z1231 Encounter for screening mammogram for malignant neoplasm of breast: Secondary | ICD-10-CM | POA: Insufficient documentation

## 2014-10-16 DIAGNOSIS — Z139 Encounter for screening, unspecified: Secondary | ICD-10-CM

## 2014-10-28 ENCOUNTER — Ambulatory Visit (INDEPENDENT_AMBULATORY_CARE_PROVIDER_SITE_OTHER): Payer: Medicare HMO | Admitting: Neurology

## 2014-10-28 ENCOUNTER — Encounter: Payer: Self-pay | Admitting: Neurology

## 2014-10-28 VITALS — BP 121/75 | HR 89 | Ht 71.0 in | Wt 211.6 lb

## 2014-10-28 DIAGNOSIS — G43909 Migraine, unspecified, not intractable, without status migrainosus: Secondary | ICD-10-CM | POA: Insufficient documentation

## 2014-10-28 DIAGNOSIS — M542 Cervicalgia: Secondary | ICD-10-CM

## 2014-10-28 DIAGNOSIS — G43709 Chronic migraine without aura, not intractable, without status migrainosus: Secondary | ICD-10-CM

## 2014-10-28 DIAGNOSIS — G8929 Other chronic pain: Secondary | ICD-10-CM

## 2014-10-31 NOTE — Progress Notes (Signed)
[facility]    Provider:  Dr Jaynee Eagles Referring Provider: Delphina Cahill, MD Primary Care Physician:  Delphina Cahill, MD  CC:  Migraines and neck pain  HPI:  Michelle Clements is a 48 y.o. female here as a follow up for migraines and neck pain. Migraines for years. She was on Topiramate and had to stop, she was fatigued, SOB, confusion, bowel and urinary problems. 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 She feels so much better since going off of the Topiramate for headache and pain. She lost 65 pounds from November to May of this year because of the Topiramate. Appetite is improving since going off. She is having headaches every day for the last 6 months, +nausea, +light sensitivity, +sounds sensitivity. Daily, they are lasting all day up to 24 hours. Sometimes it gets up to 8/10 but on average a 4/10. They get worse during menses. Unknown triggers. Advil, tylenol don't help. Imitrex helped in the past but she can't take it every day. She has had reactions to multiple medications, she doesn't want to try medications again. Took Elavil for 6 months and it gave her severe constipation and worsened her OBS. Has tried Wellbutrin and other antidepressants, is on Cymbalta right now and doesn't help the headaches at all. No aura. Not intractable.   She continues to have pain in the neck. It goes out to her shoulder joints. She has pain in her shoulders. She is weak in her upper body strength. She has a lot of pain in her neck and shoulders. She has chronic pain due to fibromyalgia.   MRI cervical spine 03/2014:  Mildly progressive cervical spondylosis and degenerative disc disease, with moderate to prominent impingement at C5-6 and moderate impingement at C6-7 as detailed above.  MRI of the brain 10/2013:  IMPRESSION: Normal MRI of the brain and intracranial MR angiography of the large and medium size vessels.   Previous notes :    04/25/14 (LL): She returns for followup after her last visit 3 months ago. Results of MRI of the neck showing mild degenerative changes at C5-6 and C6-7 but no severe compression requiring surgery at this time. She has tingling and numbness intermittently in her fingers and toes, has previous diagnosis of bilateral carpel tunnel and status-post release on right wrist. She was advised to increase her Topamax to 100 mg in the morning and 200 at night. She is tolerating Topamax well. Her headaches have improved. She had previously been on Gabapentin for fibromyalgia and has stopped using it; feels much better off of it. Has started back on Cymbalta. She wants to get back to water zumba.  01/08/14 : She returns for followup after her last visit 2 months ago. She did not undergo MRI of the cervical spine as she had a area of localized pain in her mid back and saw primary physician who tried to get a MRI of the thoracic spine along with MRI of the cervical spine at the same time but request was denied by her insurance. She complains of tingling and numbness in both hands as well as her toes which are intermittent seemed to be more brought on by sitting in a hard chair. This will last a half an hour and seems to help with activity like walking and moving around. She is currently taking Topamax 50 mg twice daily which seems to be helping somewhat and she is tolerating it well without significant side effects. She continues  to have intermittent neck pain off and on. She has stopped Klonopin and in fact she has been off all antipsychotics and has not seen a psychiatrist for a long time. She had EEG done on 11/22/13 which was normal. She denies any further episodes of confusion or altered awareness or any seizure-like activity.  Initial visit 11/06/2013 ;48 year lady with transient episode of confusion, slurred speech,disorientation on 09/25/13. She had been feeling drowsy and tired since the day prior after staring  klonopin for anxiety. She was outside and called family to bring her home and when husband arrived she was confused disoriented and unable to connect her thoughts and not remember recent events. She had some slurred speech but symptoms improved quickly. She did not have clear memory of events. 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 The headaches are mild bifrontal 4/10in severity accompanied by mild nausea and light,sound sensitivity And relieved by lying down and increased with exertion.She has no p/h/o TIA, stroke, seizures or significant head injury with loss of consciousness.   Review of Systems: Patient complains of symptoms per HPI as well as the following symptoms: appetite change, fatigue, unexpected weight gain, eye itching, eye redness, light sensitivity, double vision, eye pain, blurred vision, cold and heat intolerance, env allergies, memory loss, numbness, speech difficulty, weakness, urgency, swollen abdomen, constipation, cough, wheezing, choking, chest tightness, CP, palpitations, insomnia, apnea, snoring, back pain, joint pain, aching muscles, muscle cramps, confusion, decr concentration, anxiety . Pertinent negatives per HPI. All others negative.   History   Social History  . Marital Status: Married    Spouse Name: N/A    Number of Children: 2  . Years of Education: college   Occupational History  . 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 Smoking status: Former Smoker    Types: Cigarettes    Quit date: 11/04/2010  . Smokeless tobacco: Never Used  . Alcohol Use: No  . Drug Use: No  . Sexual Activity: Yes   Other Topics Concern  . Not on file   Social History Narrative   Married     Lives with spouse and daughter    Family History  Problem Relation Age of Onset  . Fibromyalgia Mother   . Diabetes type II Mother   . Depression Mother   . Alzheimer's disease Mother   . GER disease Mother   . Heart disease Mother   . Paranoid behavior Mother   . Dementia Mother   . Heart attack Father 31    MI x5  . Stroke Father     CVA x7  . Asthma Father   . Brain cancer Father   . Seizures Father   . Anxiety disorder Sister   . OCD Sister   . Sexual abuse Sister   . Physical abuse Sister   . Anxiety disorder Sister   . Alcohol abuse Neg Hx   . Drug abuse Neg Hx   . Schizophrenia Neg Hx   . ADD / ADHD Daughter   . ADD / ADHD Daughter     Past Medical History  Diagnosis Date  . Bipolar disorder   . Hip pain, left   . Essential hypertension, benign   .  Neck pain   . Allergic rhinitis   . Fibromyalgia   . Constipation   . IBS (irritable bowel syndrome)   . Hyperlipidemia   . GERD (gastroesophageal reflux disease)   . Depression   . Asthma   . PTSD (post-traumatic stress disorder)   . Compulsive behavior disorder   . Type 2 diabetes mellitus   . Sleep apnea     Wears CPAP at bedtime  . History of pneumonia 1988  . Poor short term memory   . Urgency of urination   . Arthritis     Lower back and hips  . Anemia   . History of palpitations     Negative Holter monitor    Past Surgical History  Procedure Laterality Date  . Cholecystectomy    . Tubal ligation    . Muscle biopsy  2008    Right leg  . Carpal tunnel release Right 06/10/2013    Procedure: CARPAL TUNNEL RELEASE;  Surgeon: Faythe Ghee, MD;  Location: [facility];  Service: Neurosurgery;  Laterality: Right;  Right Carpal Tunnel Release   . Mole exc      x 3    Current Outpatient Prescriptions  Medication Sig Dispense Refill  . acetaminophen (TYLENOL) 500 MG tablet Take 1,000 mg by mouth every 6 (six) hours as needed for pain or fever.    Marland Kitchen albuterol (PROVENTIL HFA;VENTOLIN HFA)  108 (90 BASE) MCG/ACT inhaler Inhale 2 puffs into the lungs every 6 (six) hours as needed for wheezing or shortness of breath.    . calcium & magnesium carbonates (MYLANTA) 311-232 MG per tablet Take 1 tablet by mouth daily.    Marland Kitchen co-enzyme Q-10 30 MG capsule Take 30 mg by mouth daily.    . DULoxetine (CYMBALTA) 30 MG capsule Take 30 mg by mouth 2 (two) times daily.     Noelle Penner IBUPROFEN 200 MG CAPS Take 200 mg by mouth as needed.    . fluticasone (FLONASE) 50 MCG/ACT nasal spray Place into both nostrils daily.    Marland Kitchen KRILL OIL PO Take by mouth.    . Lancets (ONETOUCH ULTRASOFT) lancets     . loratadine (CLARITIN) 10 MG tablet Take 10 mg by mouth daily.    . Melatonin 5 MG TABS Take 5 mg by mouth at bedtime.    . Multiple Vitamin (MULTIVITAMIN) tablet Take 1 tablet by mouth daily.    Marland Kitchen omeprazole (PRILOSEC) 20 MG capsule     . ONE TOUCH ULTRA TEST test strip     . senna-docusate (STOOL SOFTENER LAXATIVE) 8.6-50 MG per tablet Take 2 tablets by mouth at bedtime.    . simethicone (MYLICON) 262 MG chewable tablet Chew 125 mg by mouth every 6 (six) hours as needed for flatulence.    . tizanidine (ZANAFLEX) 2 MG capsule Take 2 mg by mouth 3 (three) times daily.    . traMADol (ULTRAM) 50 MG tablet Take 50 mg by mouth as needed.     . [DISCONTINUED] buPROPion (WELLBUTRIN SR) 150 MG 12 hr tablet Take 1 tablet (150 mg total) by mouth 2 (two) times daily. 60 tablet 1   No current facility-administered medications for this visit.    Allergies as of 10/28/2014 - Review Complete 10/28/2014  Allergen Reaction Noted  . Sulfonamide derivatives Anaphylaxis 10/03/2007  . Gluten meal Itching 09/26/2014  . Molds & smuts  09/26/2014  . Clonazepam Other (See Comments) 10/01/2013  . Dust mite extract  09/26/2014    Vitals: BP  121/75 mmHg  Pulse 89  Ht 5\' 11"  (1.803 m)  Wt 211 lb 9.6 oz (95.981 kg)  BMI 29.53 kg/m2  LMP 10/11/2014 Last Weight:  Wt Readings from Last 1 Encounters:  10/28/14 211 lb 9.6 oz  (95.981 kg)   Last Height:   Ht Readings from Last 1 Encounters:  10/28/14 5\' 11"  (1.803 m)   Physical exam: Exam: Gen: NAD, conversant, well nourised, well groomed                     CV: RRR, no MRG. No Carotid Bruits. No peripheral edema, warm, nontender Eyes: Conjunctivae clear without exudates or hemorrhage  Neuro: Detailed Neurologic Exam  Speech:    Speech is normal; fluent and spontaneous with normal comprehension.  Cognition:    The patient is oriented to person, place, and time;     recent and remote memory intact;     language fluent;     normal attention, concentration,     fund of knowledge Cranial Nerves:    The pupils are equal, round, and reactive to light. The fundi are normal and spontaneous venous pulsations are present. Visual fields are full to finger confrontation. Extraocular movements are intact. Trigeminal sensation is intact and the muscles of mastication are normal. The face is symmetric. The palate elevates in the midline. Voice is normal. Shoulder shrug is normal. The tongue has normal motion without fasciculations.   Coordination:    Normal finger to nose and heel to shin. Normal rapid alternating movements.   Gait:    Heel-toe and tandem gait are normal.   Motor Observation:    No asymmetry, no atrophy, and no involuntary movements noted. Tone:    Normal muscle tone.    Posture:    Posture is normal. normal erect    Strength:    Strength is V/V in the upper and lower limbs.      Sensation:     Intact to LT  Reflex Exam:  DTR's:    Deep tendon reflexes in the upper and lower extremities are normal bilaterally.   Toes:    The toes are downgoing bilaterally.   Clonus:    Clonus is absent.       Assessment/Plan:  48 year old patient with multiple complaints, PMHx of HTN, IBS, DM2, neuropathy, gluten enteropathy, common variable immunodeficiency disorder, Fibromyalgia, bipolar disorder, depression, HTN who is here for follow up of  chronic migraine disorder and chronic neck pain. Neuro exam is non focal. Has tried and failed or not tolerated multiple migraine medications. Will hold off on trying anymore medication and will try and get migraine botox approval. For her chronic pain and fibromyalgia, she is on Cymbalta. She is going to increase her Cymbalta for pain, getting prior auth. And working with her pcp. Will order massage, stress and pain management, acupuncture and massage from [facility] but if insurance does not approve will order PT at [facility] for her neck pain. I think she could benefit from stress management and biofeedback, she should continue to follow with psychiatry and therapy.     Sarina Ill, MD  [facility] [address] [place], [place] [address]  Phone [phone] Fax [phone]

## 2014-11-04 DIAGNOSIS — Z9229 Personal history of other drug therapy: Secondary | ICD-10-CM

## 2014-11-04 HISTORY — DX: Personal history of other drug therapy: Z92.29

## 2014-11-05 ENCOUNTER — Telehealth: Payer: Self-pay | Admitting: Neurology

## 2014-11-05 NOTE — Telephone Encounter (Signed)
Markham Jordan, can you resend this patient's referral to [facility] and let her know what is going on with Botox if anything. Thanks

## 2014-11-05 NOTE — Telephone Encounter (Signed)
Colletta Maryland, neither of these orders (packet for botox and order for PT) have actually been put in yet.

## 2014-11-05 NOTE — Telephone Encounter (Signed)
Patient is calling because [facility] does not take patient's insurance. Patient will need a referral to [facility] for physical therapy. Also patient is checking on the status of scheduling Botox. Please call patient and advise. Thank you.

## 2014-11-06 NOTE — Telephone Encounter (Signed)
Dr. Jaynee Eagles, not sure if you want this patient have to have PT and Botox if so can you please put the referral in for PT to be done at [facility] and the paper work for the Botox. Thanks

## 2014-11-07 ENCOUNTER — Encounter (HOSPITAL_BASED_OUTPATIENT_CLINIC_OR_DEPARTMENT_OTHER): Payer: Commercial Managed Care - HMO

## 2014-11-07 ENCOUNTER — Encounter (HOSPITAL_COMMUNITY): Payer: Self-pay

## 2014-11-07 ENCOUNTER — Encounter (HOSPITAL_COMMUNITY): Payer: Commercial Managed Care - HMO | Attending: Hematology and Oncology

## 2014-11-07 DIAGNOSIS — D839 Common variable immunodeficiency, unspecified: Secondary | ICD-10-CM

## 2014-11-07 DIAGNOSIS — E119 Type 2 diabetes mellitus without complications: Secondary | ICD-10-CM

## 2014-11-07 LAB — COMPREHENSIVE METABOLIC PANEL
ALT: 17 U/L (ref 0–35)
ANION GAP: 11 (ref 5–15)
AST: 16 U/L (ref 0–37)
Albumin: 4 g/dL (ref 3.5–5.2)
Alkaline Phosphatase: 61 U/L (ref 39–117)
BUN: 12 mg/dL (ref 6–23)
CO2: 27 meq/L (ref 19–32)
Calcium: 9.6 mg/dL (ref 8.4–10.5)
Chloride: 104 mEq/L (ref 96–112)
Creatinine, Ser: 0.96 mg/dL (ref 0.50–1.10)
GFR calc Af Amer: 81 mL/min — ABNORMAL LOW (ref >90)
GFR, EST NON AFRICAN AMERICAN: 70 mL/min — AB (ref >90)
GLUCOSE: 91 mg/dL (ref 70–99)
POTASSIUM: 4 meq/L (ref 3.7–5.3)
Sodium: 142 mEq/L (ref 137–147)
TOTAL PROTEIN: 6.7 g/dL (ref 6.0–8.3)
Total Bilirubin: 0.3 mg/dL (ref 0.3–1.2)

## 2014-11-07 LAB — CBC WITH DIFFERENTIAL/PLATELET
Basophils Absolute: 0.1 10*3/uL (ref 0.0–0.1)
Basophils Relative: 1 % (ref 0–1)
EOS ABS: 0.3 10*3/uL (ref 0.0–0.7)
Eosinophils Relative: 3 % (ref 0–5)
HCT: 37.2 % (ref 36.0–46.0)
HEMOGLOBIN: 13.3 g/dL (ref 12.0–15.0)
LYMPHS ABS: 2.1 10*3/uL (ref 0.7–4.0)
Lymphocytes Relative: 25 % (ref 12–46)
MCH: 33.2 pg (ref 26.0–34.0)
MCHC: 35.8 g/dL (ref 30.0–36.0)
MCV: 92.8 fL (ref 78.0–100.0)
MONOS PCT: 6 % (ref 3–12)
Monocytes Absolute: 0.5 10*3/uL (ref 0.1–1.0)
Neutro Abs: 5.5 10*3/uL (ref 1.7–7.7)
Neutrophils Relative %: 65 % (ref 43–77)
Platelets: 350 10*3/uL (ref 150–400)
RBC: 4.01 MIL/uL (ref 3.87–5.11)
RDW: 12.7 % (ref 11.5–15.5)
WBC: 8.4 10*3/uL (ref 4.0–10.5)

## 2014-11-07 NOTE — Progress Notes (Signed)
Labs for cbcd,cmp,sifx

## 2014-11-07 NOTE — Progress Notes (Signed)
Grants, [place], MD  [place] [address]  DIAGNOSIS: Common variable immunodeficiency - Plan: CBC with Differential, Comprehensive metabolic panel, Immunofixation electrophoresis  Chief Complaint  Patient presents with  . Immunodeficiency    Common variable immunodeficiency syndrome    CURRENT THERAPY: Watchful expectation in conjunction with Dr. Lloyd Huger at [facility]. Workup at [facility] determined that she was NOT anergic.  INTERVAL HISTORY: Michelle Clements 48 y.o. 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 Workup done here was negative for evidence of a lymphoproliferative disorder, primary bone marrow disorder, H. pylori infection, or collagen vascular disorder. She was referred to [facility] and was seen there for initial consultation on 09/17/2014 with a follow-up visit on 10/27/2014. Discontinuation of Topamax resulted in improvement in her shortness of breath, fatigue, and anorexia. Cymbalta dose was increased. Conservative management was recommended by Dr. Lloyd Huger. The patient still has problems with recurrent sino- bronchial infections. She denies any diarrhea, cough, wheezing, melena, hematochezia, hematuria, skin rash, or worsening migraines. She is having some aberrant menstrual bleeding with discharge and has an appointment to see her gynecologist on 11/10/2014. The patient knows she has a low IgG level and is interested in seeing what happens after IgG is given to correct it.  MEDICAL HISTORY: Past Medical History  Diagnosis Date  . Bipolar disorder   . Hip pain, left   . Essential hypertension, benign   . Neck pain   . Allergic rhinitis   . Fibromyalgia   . Constipation   . IBS (irritable bowel syndrome)   . Hyperlipidemia   . GERD  (gastroesophageal reflux disease)   . Depression   . Asthma   . PTSD (post-traumatic stress disorder)   . Compulsive behavior disorder   . Type 2 diabetes mellitus   . Sleep apnea     Wears CPAP at bedtime  . History of pneumonia 1988  . Poor short term memory   . Urgency of urination   . Arthritis     Lower back and hips  . Anemia   . History of palpitations     Negative Holter monitor    INTERIM HISTORY: has Unspecified vitamin D deficiency; Mixed hyperlipidemia; Morbid obesity; BIPOLAR AFFECTIVE DISORDER; Hx of tobacco use, presenting hazards to health; DEPRESSION; Essential hypertension, benign; IBS; FIBROMYALGIA; Palpitations; Neuropathy of upper extremity; History of stroke in prior 3 months; Headache(784.0); Cervicalgia; Acute confusional state; Common variable immunodeficiency; Migraine; and Neck pain of over 3 months duration on her problem list.    ALLERGIES:  is allergic to sulfonamide derivatives; gluten meal; molds & smuts; clonazepam; and dust mite extract.  MEDICATIONS: has a current medication list which includes the following prescription(s): acetaminophen, albuterol, calcium & magnesium carbonates, co-enzyme q-10, duloxetine, eq ibuprofen, fluticasone, krill oil, loratadine, melatonin, multivitamin, omeprazole, pravastatin, simethicone, tizanidine, tramadol, onetouch ultrasoft, one touch ultra test, and senna-docusate.  SURGICAL HISTORY:  Past Surgical History  Procedure Laterality Date  . Cholecystectomy    . Tubal ligation    . Muscle biopsy  2008    Right leg  . Carpal tunnel release Right 06/10/2013    Procedure: CARPAL TUNNEL RELEASE;  Surgeon: Faythe Ghee, MD;  Location: [facility];  Service: Neurosurgery;  Laterality: Right;  Right Carpal Tunnel Release   . Mole exc  x 3    FAMILY HISTORY: family history includes ADD / ADHD in her daughter and daughter; Alzheimer's disease in her mother; Anxiety disorder in her sister and sister; Asthma in her  father; Brain cancer in her father; Dementia in her mother; Depression in her mother; Diabetes type II in her mother; Fibromyalgia in her mother; GER disease in her mother; Heart attack (age of onset: 81) in her father; Heart disease in her mother; OCD in her sister; Paranoid behavior in her mother; Physical abuse in her sister; Seizures in her father; Sexual abuse in her sister; Stroke in her father. There is no history of Alcohol abuse, Drug abuse, or Schizophrenia.  SOCIAL HISTORY:  reports that she quit smoking about 4 years ago. Her smoking use included Cigarettes. She smoked 0.00 packs per day. She has never used smokeless tobacco. She reports that she does not drink alcohol or use illicit drugs.  REVIEW OF SYSTEMS:  Other than that discussed above is noncontributory.  PHYSICAL EXAMINATION: ECOG PERFORMANCE STATUS: 1 - Symptomatic but completely ambulatory  Blood pressure 125/67, pulse 87, temperature 97.5 F (36.4 C), temperature source Oral, resp. rate 18, weight 213 lb 9.6 oz (96.888 kg), last menstrual period 10/11/2014, SpO2 100 %.  GENERAL:alert, no distress and comfortable SKIN: skin color, texture, turgor are normal, no rashes or significant lesions EYES: PERLA; Conjunctiva are pink and non-injected, sclera clear SINUSES: No redness or tenderness over maxillary or ethmoid sinuses. Bilateral rhinorrhea. OROPHARYNX:no exudate, no erythema on lips, buccal mucosa, or tongue. NECK: supple, thyroid normal size, non-tender, without nodularity. No masses CHEST: Normal AP diameter with no breast masses. LYMPH:  no palpable lymphadenopathy in the cervical, axillary or inguinal LUNGS: clear to auscultation and percussion with normal breathing effort HEART: regular rate & rhythm and no murmurs. ABDOMEN:abdomen soft, non-tender and normal bowel sounds MUSCULOSKELETAL:no cyanosis of digits and no clubbing. Range of motion normal.  NEURO: alert & oriented x 3 with fluent speech, no focal  motor/sensory deficits   LABORATORY DATA: 08/27/2014: IgG 538 09/17/2014: IgG 566  Office Visit on 11/07/2014  Component Date Value Ref Range Status  . WBC 11/07/2014 8.4  4.0 - 10.5 K/uL Final  . RBC 11/07/2014 4.01  3.87 - 5.11 MIL/uL Final  . Hemoglobin 11/07/2014 13.3  12.0 - 15.0 g/dL Final  . HCT 11/07/2014 37.2  36.0 - 46.0 % Final  . MCV 11/07/2014 92.8  78.0 - 100.0 fL Final  . MCH 11/07/2014 33.2  26.0 - 34.0 pg Final  . MCHC 11/07/2014 35.8  30.0 - 36.0 g/dL Final  . RDW 11/07/2014 12.7  11.5 - 15.5 % Final  . Platelets 11/07/2014 350  150 - 400 K/uL Final  . Neutrophils Relative % 11/07/2014 65  43 - 77 % Final  . Neutro Abs 11/07/2014 5.5  1.7 - 7.7 K/uL Final  . Lymphocytes Relative 11/07/2014 25  12 - 46 % Final  . Lymphs Abs 11/07/2014 2.1  0.7 - 4.0 K/uL Final  . Monocytes Relative 11/07/2014 6  3 - 12 % Final  . Monocytes Absolute 11/07/2014 0.5  0.1 - 1.0 K/uL Final  . Eosinophils Relative 11/07/2014 3  0 - 5 % Final  . Eosinophils Absolute 11/07/2014 0.3  0.0 - 0.7 K/uL Final  . Basophils Relative 11/07/2014 1  0 - 1 % Final  . Basophils Absolute 11/07/2014 0.1  0.0 - 0.1 K/uL Final  . Sodium 11/07/2014 142  137 - 147 mEq/L Final  . Potassium 11/07/2014 4.0  3.7 - 5.3 mEq/L Final  . Chloride 11/07/2014 104  96 - 112 mEq/L Final  . CO2 11/07/2014 27  19 - 32 mEq/L Final  . Glucose, Bld 11/07/2014 91  70 - 99 mg/dL Final  . BUN 11/07/2014 12  6 - 23 mg/dL Final  . Creatinine, Ser 11/07/2014 0.96  0.50 - 1.10 mg/dL Final  . Calcium 11/07/2014 9.6  8.4 - 10.5 mg/dL Final  . Total Protein 11/07/2014 6.7  6.0 - 8.3 g/dL Final  . Albumin 11/07/2014 4.0  3.5 - 5.2 g/dL Final  . AST 11/07/2014 16  0 - 37 U/L Final  . ALT 11/07/2014 17  0 - 35 U/L Final  . Alkaline Phosphatase 11/07/2014 61  39 - 117 U/L Final  . Total Bilirubin 11/07/2014 0.3  0.3 - 1.2 mg/dL Final  . GFR calc non Af Amer 11/07/2014 70* >90 mL/min Final  . GFR calc Af Amer 11/07/2014 81* >90  mL/min Final   Comment: (NOTE) The eGFR has been calculated using the CKD EPI equation. This calculation has not been validated in all clinical situations. eGFR's persistently <90 mL/min signify possible Chronic Kidney Disease.   . Anion gap 11/07/2014 11  5 - 15 Final    PATHOLOGY: No new pathology.  Urinalysis    Component Value Date/Time   COLORURINE YELLOW 10/13/2012 1821   APPEARANCEUR CLEAR 10/13/2012 1821   LABSPEC 1.018 10/13/2012 1821   PHURINE 6.5 10/13/2012 1821   GLUCOSEU NEGATIVE 10/13/2012 1821   HGBUR MODERATE* 10/13/2012 1821   HGBUR negative 06/03/2008 0902   BILIRUBINUR NEGATIVE 10/13/2012 1821   KETONESUR NEGATIVE 10/13/2012 1821   PROTEINUR NEGATIVE 10/13/2012 1821   UROBILINOGEN 1.0 10/13/2012 1821   NITRITE NEGATIVE 10/13/2012 1821   LEUKOCYTESUR TRACE* 10/13/2012 1821    RADIOGRAPHIC STUDIES: Mm Digital Screening Bilateral  10/16/2014   CLINICAL DATA:  Screening.  EXAM: DIGITAL SCREENING BILATERAL MAMMOGRAM WITH CAD  COMPARISON:  Previous exam(s).  ACR Breast Density Category b: There are scattered areas of fibroglandular density.  FINDINGS: There are no findings suspicious for malignancy. Images were processed with CAD.  IMPRESSION: No mammographic evidence of malignancy. A result letter of this screening mammogram will be mailed directly to the patient.  RECOMMENDATION: Screening mammogram in one year. (Code:SM-B-01Y)  BI-RADS CATEGORY  1: Negative.   Electronically Signed   By: Curlene Dolphin M.D.   On: 10/16/2014 16:18     ASSESSMENT:  #1. 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 Type 2 diabetes mellitus, non-insulin requiring, controlled.  #3. Gluten enteropathy. #4. Allergic rhinitis.   PLAN:  #1. Trial of intravenous gammaglobulin 50 g to be given on 11/13/2014. #2. Follow-up in 5 weeks with CBC, chem profile, and  immunofixation.   All questions were answered. The patient knows to call the clinic with any problems, questions or concerns. We can certainly see the patient much sooner if necessary.   I spent 30 minutes counseling the patient face to face. The total time spent in the appointment was 40 minutes.    Doroteo Bradford, MD 11/07/2014 1:46 PM  DISCLAIMER:  This note was dictated with voice recognition software.  Similar sounding words can inadvertently be transcribed inaccurately and may not be corrected upon review.

## 2014-11-07 NOTE — Patient Instructions (Signed)
[place] Discharge Instructions  RECOMMENDATIONS MADE BY THE CONSULTANT AND ANY TEST RESULTS WILL BE SENT TO YOUR REFERRING PHYSICIAN.  EXAM FINDINGS BY THE PHYSICIAN TODAY AND SIGNS OR SYMPTOMS TO REPORT TO CLINIC OR PRIMARY PHYSICIAN: Exam and findings as discussed by Dr. Barnet Glasgow.  Will try to get approval to give you IVIG next week to see if it will help with your infections.  MEDICATIONS PRESCRIBED:  none  INSTRUCTIONS/FOLLOW-UP: Labs and office visit in 5 weeks.  Thank you for choosing [facility] to provide your oncology and hematology care.  To afford each patient quality time with our providers, please arrive at least 15 minutes before your scheduled appointment time.  With your help, our goal is to use those 15 minutes to complete the necessary work-up to ensure our physicians have the information they need to help with your evaluation and healthcare recommendations.    Effective January 1st, 2014, we ask that you re-schedule your appointment with our physicians should you arrive 10 or more minutes late for your appointment.  We strive to give you quality time with our providers, and arriving late affects you and other patients whose appointments are after yours.    Again, thank you for choosing [facility].  Our hope is that these requests will decrease the amount of time that you wait before being seen by our physicians.       _____________________________________________________________  Should you have questions after your visit to [facility] [address], please contact our office at [phone] between the hours of 8:30 a.m. and 4:30 p.m.  Voicemails left after 4:30 p.m. will not be returned until the following business day.  For prescription refill requests, have your pharmacy contact our office with your prescription refill request.    _______________________________________________________________  We hope that we have  given you very good care.  You may receive a patient satisfaction survey in the mail, please complete it and return it as soon as possible.  We value your feedback!  _______________________________________________________________  Have you asked about our STAR program?  STAR stands for Survivorship Training and Rehabilitation, and this is a nationally recognized cancer care program that focuses on survivorship and rehabilitation.  Cancer and cancer treatments may cause problems, such as, pain, making you feel tired and keeping you from doing the things that you need or want to do. Cancer rehabilitation can help. Our goal is to reduce these troubling effects and help you have the best quality of life possible.  You may receive a survey from a nurse that asks questions about your current state of health.  Based on the survey results, all eligible patients will be referred to the [facility] program for an evaluation so we can better serve you!  A frequently asked questions sheet is available upon request.  Immune Globulin Injection What is this medicine? IMMUNE GLOBULIN (im MUNE GLOB yoo lin) helps to prevent or reduce the severity of certain infections in patients who are at risk. This medicine is collected from the pooled blood of many donors. It is used to treat immune system problems, thrombocytopenia, and Kawasaki syndrome. This medicine may be used for other purposes; ask your health care provider or pharmacist if you have questions. COMMON BRAND NAME(S): Baygam, BIVIGAM, Carimune, Carimune NF, Flebogamma, Flebogamma DIF, GamaSTAN S/D, Gamimune N, Gammagard S/D, Gammaked, Gammaplex, Gammar-P IV, Gamunex, Gamunex-C, Hizentra, Iveegam, Iveegam EN, Octagam, Panglobulin, Panglobulin NF, Polygam S/D, Privigen, Sandoglobulin, Venoglobulin-S, Vigam, Vivaglobulin What  should I tell my health care provider before I take this medicine? They need to know if you have any of these  conditions: - diabetes - extremely low or no immune antibodies in the blood - heart disease - history of blood clots - hyperprolinemia - infection in the blood, sepsis - kidney disease - taking medicine that may change kidney function - ask your health care provider about your medicine - an unusual or allergic reaction to human immune globulin, albumin, maltose, sucrose, polysorbate 80, other medicines, foods, dyes, or preservatives - pregnant or trying to get pregnant - breast-feeding How should I use this medicine? This medicine is for injection into a muscle or infusion into a vein or skin. It is usually given by a health care professional in a hospital or clinic setting. In rare cases, some brands of this medicine might be given at home. You will be taught how to give this medicine. Use exactly as directed. Take your medicine at regular intervals. Do not take your medicine more often than directed. Talk to your pediatrician regarding the use of this medicine in children. Special care may be needed. Overdosage: If you think you have taken too much of this medicine contact a poison control center or emergency room at once. NOTE: This medicine is only for you. Do not share this medicine with others. What if I miss a dose? It is important not to miss your dose. Call your doctor or health care professional if you are unable to keep an appointment. If you give yourself the medicine and you miss a dose, take it as soon as you can. If it is almost time for your next dose, take only that dose. Do not take double or extra doses. What may interact with this medicine? -aspirin and aspirin-like medicines -cisplatin -cyclosporine -medicines for infection like acyclovir, adefovir, amphotericin B, bacitracin, cidofovir, foscarnet, ganciclovir, gentamicin, pentamidine, vancomycin -NSAIDS, medicines for pain and inflammation, like ibuprofen or naproxen -pamidronate -vaccines -zoledronic  acid This list may not describe all possible interactions. Give your health care provider a list of all the medicines, herbs, non-prescription drugs, or dietary supplements you use. Also tell them if you smoke, drink alcohol, or use illegal drugs. Some items may interact with your medicine. What should I watch for while using this medicine? Your condition will be monitored carefully while you are receiving this medicine. This medicine is made from pooled blood donations of many different people. It may be possible to pass an infection in this medicine. However, the donors are screened for infections and all products are tested for HIV and hepatitis. The medicine is treated to kill most or all bacteria and viruses. Talk to your doctor about the risks and benefits of this medicine. Do not have vaccinations for at least 14 days before, or until at least 3 months after receiving this medicine. What side effects may I notice from receiving this medicine? Side effects that you should report to your doctor or health care professional as soon as possible: -allergic reactions like skin rash, itching or hives, swelling of the face, lips, or tongue -breathing problems -chest pain or tightness -fever, chills -headache with nausea, vomiting -neck pain or difficulty moving neck -pain when moving eyes -pain, swelling, warmth in the leg -problems with balance, talking, walking -sudden weight gain -swelling of the ankles, feet, hands -trouble passing urine or change in the amount of urine Side effects that usually do not require medical attention (report to your doctor or health  care professional if they continue or are bothersome): -dizzy, drowsy -flushing -increased sweating -leg cramps -muscle aches and pains -pain at site where injected This list may not describe all possible side effects. Call your doctor for medical advice about side effects. You may report side effects to FDA at  1-800-FDA-1088. Where should I keep my medicine? Keep out of the reach of children. This drug is usually given in a hospital or clinic and will not be stored at home. In rare cases, some brands of this medicine may be given at home. If you are using this medicine at home, you will be instructed on how to store this medicine. Throw away any unused medicine after the expiration date on the label. NOTE: This sheet is a summary. It may not cover all possible information. If you have questions about this medicine, talk to your doctor, pharmacist, or health care provider.  2015, Elsevier/Gold Standard. (2009-02-11 11:44:49)

## 2014-11-10 ENCOUNTER — Other Ambulatory Visit: Payer: Self-pay | Admitting: Neurology

## 2014-11-10 DIAGNOSIS — M542 Cervicalgia: Secondary | ICD-10-CM

## 2014-11-10 DIAGNOSIS — G43709 Chronic migraine without aura, not intractable, without status migrainosus: Secondary | ICD-10-CM

## 2014-11-10 NOTE — Telephone Encounter (Signed)
Would you call patient and let her know I will place the PT referral and check up on the botox paperwork. Thank you.

## 2014-11-10 NOTE — Telephone Encounter (Signed)
Spoke to patient and she is aware of the referrals being put in.

## 2014-11-11 LAB — IMMUNOFIXATION ELECTROPHORESIS
IgA: 70 mg/dL (ref 69–380)
IgG (Immunoglobin G), Serum: 571 mg/dL — ABNORMAL LOW (ref 690–1700)
IgM, Serum: 24 mg/dL — ABNORMAL LOW (ref 52–322)
Total Protein ELP: 6.5 g/dL (ref 6.0–8.3)

## 2014-11-12 ENCOUNTER — Ambulatory Visit (HOSPITAL_COMMUNITY): Payer: Self-pay

## 2014-11-13 ENCOUNTER — Encounter (HOSPITAL_BASED_OUTPATIENT_CLINIC_OR_DEPARTMENT_OTHER): Payer: Commercial Managed Care - HMO

## 2014-11-13 ENCOUNTER — Encounter (HOSPITAL_COMMUNITY): Payer: Self-pay

## 2014-11-13 DIAGNOSIS — D839 Common variable immunodeficiency, unspecified: Secondary | ICD-10-CM

## 2014-11-13 MED ORDER — SODIUM CHLORIDE 0.9 % IV SOLN
Freq: Once | INTRAVENOUS | Status: AC
  Administered 2014-11-13: 10:00:00 via INTRAVENOUS

## 2014-11-13 MED ORDER — HEPARIN SOD (PORK) LOCK FLUSH 100 UNIT/ML IV SOLN: 500.0000 [IU] | Freq: Once | INTRAVENOUS | Status: DC | PRN

## 2014-11-13 MED ORDER — ALTEPLASE 2 MG IJ SOLR: 2.0000 mg | Freq: Once | INTRAMUSCULAR | Status: DC | PRN

## 2014-11-13 MED ORDER — SODIUM CHLORIDE 0.9 % IJ SOLN: 10.0000 mL | INTRAMUSCULAR | Status: DC | PRN

## 2014-11-13 MED ORDER — SODIUM CHLORIDE 0.9 % IJ SOLN: 3.0000 mL | Freq: Once | INTRAMUSCULAR | Status: DC | PRN

## 2014-11-13 MED ORDER — DIPHENHYDRAMINE HCL 25 MG PO CAPS
25.0000 mg | ORAL_CAPSULE | Freq: Once | ORAL | Status: AC
  Administered 2014-11-13: 25 mg via ORAL

## 2014-11-13 MED ORDER — IMMUNE GLOBULIN (HUMAN) 10 GM/100ML IV SOLN
50.0000 g | Freq: Once | INTRAVENOUS | Status: AC
  Administered 2014-11-13: 50 g via INTRAVENOUS
  Filled 2014-11-13: qty 400

## 2014-11-13 MED ORDER — DIPHENHYDRAMINE HCL 25 MG PO CAPS
ORAL_CAPSULE | ORAL | Status: AC
Start: 2014-11-13 — End: 2014-11-13
  Filled 2014-11-13: qty 1

## 2014-11-13 MED ORDER — ACETAMINOPHEN 325 MG PO TABS
ORAL_TABLET | ORAL | Status: AC
  Filled 2014-11-13: qty 2

## 2014-11-13 MED ORDER — HEPARIN SOD (PORK) LOCK FLUSH 100 UNIT/ML IV SOLN: 250.0000 [IU] | Freq: Once | INTRAVENOUS | Status: DC | PRN

## 2014-11-13 MED ORDER — ACETAMINOPHEN 325 MG PO TABS
650.0000 mg | ORAL_TABLET | Freq: Four times a day (QID) | ORAL | Status: DC | PRN
  Administered 2014-11-13: 650 mg via ORAL

## 2014-11-13 NOTE — Progress Notes (Signed)
Michelle Clements\ Tolerated IVIG well today.  Discharged ambulatory

## 2014-11-13 NOTE — Patient Instructions (Addendum)
[place] Discharge Instructions  RECOMMENDATIONS MADE BY THE CONSULTANT AND ANY TEST RESULTS WILL BE SENT TO YOUR REFERRING PHYSICIAN.  You received IVIG today.. Please call the clinic if you have any questions or conerns  Thank you for choosing [facility] to provide your oncology and hematology care.  To afford each patient quality time with our providers, please arrive at least 15 minutes before your scheduled appointment time.  With your help, our goal is to use those 15 minutes to complete the necessary work-up to ensure our physicians have the information they need to help with your evaluation and healthcare recommendations.    Effective January 1st, 2014, we ask that you re-schedule your appointment with our physicians should you arrive 10 or more minutes late for your appointment.  We strive to give you quality time with our providers, and arriving late affects you and other patients whose appointments are after yours.    Again, thank you for choosing [facility].  Our hope is that these requests will decrease the amount of time that you wait before being seen by our physicians.       _____________________________________________________________  Should you have questions after your visit to [facility], please contact our office at [phone] between the hours of 8:30 a.m. and 5:00 p.m.  Voicemails left after 4:30 p.m. will not be returned until the following business day.  For prescription refill requests, have your pharmacy contact our office with your prescription refill request.    Immune Globulin Injection What is this medicine? IMMUNE GLOBULIN (im MUNE GLOB yoo lin) helps to prevent or reduce the severity of certain infections in patients who are at risk. This medicine is collected from the pooled blood of many donors. It is used to treat immune system problems, thrombocytopenia, and Kawasaki syndrome. This medicine  may be used for other purposes; ask your health care provider or pharmacist if you have questions. COMMON BRAND NAME(S): Baygam, BIVIGAM, Carimune, Carimune NF, Flebogamma, Flebogamma DIF, GamaSTAN S/D, Gamimune N, Gammagard S/D, Gammaked, Gammaplex, Gammar-P IV, Gamunex, Gamunex-C, Hizentra, Iveegam, Iveegam EN, Octagam, Panglobulin, Panglobulin NF, Polygam S/D, Privigen, Sandoglobulin, Venoglobulin-S, Vigam, Vivaglobulin What should I tell my health care provider before I take this medicine? They need to know if you have any of these conditions: - diabetes - extremely low or no immune antibodies in the blood - heart disease - history of blood clots - hyperprolinemia - infection in the blood, sepsis - kidney disease - taking medicine that may change kidney function - ask your health care provider about your medicine - an unusual or allergic reaction to human immune globulin, albumin, maltose, sucrose, polysorbate 80, other medicines, foods, dyes, or preservatives - pregnant or trying to get pregnant - breast-feeding How should I use this medicine? This medicine is for injection into a muscle or infusion into a vein or skin. It is usually given by a health care professional in a hospital or clinic setting. In rare cases, some brands of this medicine might be given at home. You will be taught how to give this medicine. Use exactly as directed. Take your medicine at regular intervals. Do not take your medicine more often than directed. Talk to your pediatrician regarding the use of this medicine in children. Special care may be needed. Overdosage: If you think you have taken too much of this medicine contact a poison control center or emergency room at once. NOTE: This medicine is  only for you. Do not share this medicine with others. What if I miss a dose? It is important not to miss your dose. Call your doctor or health care professional if you are unable to keep an appointment. If  you give yourself the medicine and you miss a dose, take it as soon as you can. If it is almost time for your next dose, take only that dose. Do not take double or extra doses. What may interact with this medicine? -aspirin and aspirin-like medicines -cisplatin -cyclosporine -medicines for infection like acyclovir, adefovir, amphotericin B, bacitracin, cidofovir, foscarnet, ganciclovir, gentamicin, pentamidine, vancomycin -NSAIDS, medicines for pain and inflammation, like ibuprofen or naproxen -pamidronate -vaccines -zoledronic acid This list may not describe all possible interactions. Give your health care provider a list of all the medicines, herbs, non-prescription drugs, or dietary supplements you use. Also tell them if you smoke, drink alcohol, or use illegal drugs. Some items may interact with your medicine. What should I watch for while using this medicine? Your condition will be monitored carefully while you are receiving this medicine. This medicine is made from pooled blood donations of many different people. It may be possible to pass an infection in this medicine. However, the donors are screened for infections and all products are tested for HIV and hepatitis. The medicine is treated to kill most or all bacteria and viruses. Talk to your doctor about the risks and benefits of this medicine. Do not have vaccinations for at least 14 days before, or until at least 3 months after receiving this medicine. What side effects may I notice from receiving this medicine? Side effects that you should report to your doctor or health care professional as soon as possible: -allergic reactions like skin rash, itching or hives, swelling of the face, lips, or tongue -breathing problems -chest pain or tightness -fever, chills -headache with nausea, vomiting -neck pain or difficulty moving neck -pain when moving eyes -pain, swelling, warmth in the leg -problems with balance, talking,  walking -sudden weight gain -swelling of the ankles, feet, hands -trouble passing urine or change in the amount of urine Side effects that usually do not require medical attention (report to your doctor or health care professional if they continue or are bothersome): -dizzy, drowsy -flushing -increased sweating -leg cramps -muscle aches and pains -pain at site where injected This list may not describe all possible side effects. Call your doctor for medical advice about side effects. You may report side effects to FDA at 1-800-FDA-1088. Where should I keep my medicine? Keep out of the reach of children. This drug is usually given in a hospital or clinic and will not be stored at home. In rare cases, some brands of this medicine may be given at home. If you are using this medicine at home, you will be instructed on how to store this medicine. Throw away any unused medicine after the expiration date on the label. NOTE: This sheet is a summary. It may not cover all possible information. If you have questions about this medicine, talk to your doctor, pharmacist, or health care provider.  2015, Elsevier/Gold Standard. (2009-02-11 11:44:49)

## 2014-11-14 ENCOUNTER — Telehealth (HOSPITAL_COMMUNITY): Payer: Self-pay

## 2014-11-14 NOTE — Telephone Encounter (Signed)
Rn attempted to contact the pt. For 24 hour follow up. Message left at contact number.

## 2014-11-19 ENCOUNTER — Encounter (HOSPITAL_COMMUNITY): Payer: Self-pay | Admitting: Hematology & Oncology

## 2014-11-26 ENCOUNTER — Ambulatory Visit (HOSPITAL_COMMUNITY)
Admission: RE | Admit: 2014-11-26 | Discharge: 2014-11-26 | Disposition: A | Discharge status: Home / Self Care | Payer: Commercial Managed Care - HMO | Source: Ambulatory Visit | Attending: Neurology | Admitting: Neurology

## 2014-11-26 DIAGNOSIS — G44229 Chronic tension-type headache, not intractable: Secondary | ICD-10-CM | POA: Diagnosis not present

## 2014-11-26 DIAGNOSIS — M5382 Other specified dorsopathies, cervical region: Secondary | ICD-10-CM | POA: Diagnosis not present

## 2014-11-26 DIAGNOSIS — M542 Cervicalgia: Secondary | ICD-10-CM | POA: Insufficient documentation

## 2014-11-26 DIAGNOSIS — M436 Torticollis: Secondary | ICD-10-CM | POA: Diagnosis not present

## 2014-11-26 NOTE — Patient Instructions (Addendum)
Strengthening: Shoulder Shrug (Phase 1)   Shrug shoulders up and down, forward and backward. Repeat 5__ times per set. Do __1__ sets per session. Do ____3 sessions per day.  http://orth.exer.us/336   Copyright  VHI. All rights reserved.  Flexibility: Neck Retraction   Pull head straight back, keeping eyes and jaw level. Repeat ___10_ times per set. Do ___1_ sets per session. Do _2___ sessions per day.  http://orth.exer.us/344   Copyright  VHI. All rights reserved.  AROM: Neck Extension   Bend head backward. Hold _1___ seconds. Repeat ___5-10_ times per set. Do _1___ sets per session. Do ___4_ sessions per day.  http://orth.exer.us/300   Copyright  VHI. All rights reserved.  Scapular Retraction (Standing)   With arms at sides, pinch shoulder blades together. Repeat _10__ times per set. Do ___1_ sets per session. Do ____3 sessions per day.  http://orth.exer.us/945   Copyright  VHI. All rights reserved.

## 2014-11-26 NOTE — Therapy (Signed)
[place], [place], [address] Phone: [phone]   Fax:  [phone]  Physical Therapy Evaluation  Patient Details  Name: Michelle Clements MRN: [id number] Date of Birth: Apr 14, 1966  Encounter Date: 11/26/2014      PT End of Session - 11/26/14 1603    Visit Number 1   Number of Visits 8   Date for PT Re-Evaluation 12/26/14   Authorization Type [employer]   PT Start Time 4132   PT Stop Time 1600   PT Time Calculation (min) 30 min      Past Medical History  Diagnosis Date  . Bipolar disorder   . Hip pain, left   . Essential hypertension, benign   . Neck pain   . Allergic rhinitis   . Fibromyalgia   . Constipation   . IBS (irritable bowel syndrome)   . Hyperlipidemia   . GERD (gastroesophageal reflux disease)   . Depression   . Asthma   . PTSD (post-traumatic stress disorder)   . Compulsive behavior disorder   . Type 2 diabetes mellitus   . Sleep apnea     Wears CPAP at bedtime  . History of pneumonia 1988  . Poor short term memory   . Urgency of urination   . Arthritis     Lower back and hips  . Anemia   . History of palpitations     Negative Holter monitor    Past Surgical History  Procedure Laterality Date  . Cholecystectomy    . Tubal ligation    . Muscle biopsy  2008    Right leg  . Carpal tunnel release Right 06/10/2013    Procedure: CARPAL TUNNEL RELEASE;  Surgeon: Faythe Ghee, MD;  Location: [facility];  Service: Neurosurgery;  Laterality: Right;  Right Carpal Tunnel Release   . Mole exc      x 3    There were no vitals taken for this visit.  Visit Diagnosis:  Cervical pain  Chronic tension-type headache, not intractable  Neck stiffness  Weakness of neck      Subjective Assessment - 11/26/14 1617    Symptoms Michelle Clements states that she has had neck pain for years.  She states that she has been seeing a neurologist for years but has never been referred to therapy.   Usually her Rt side is more painful than her LT.  She has headaches almost daily.  She is scheduled to have botox next week.  She has pain at the base of her skull, Rt temple and top of her head.  She is being referred to therapy to try and imrove her sx of pain.      Pertinent History fibromyalgia.   How long can you sit comfortably? no change   How long can you stand comfortably? no change   Currently in Pain? Yes   Pain Score 6   worst is an 8/10 best 2/10   Pain Location Neck   Pain Orientation Right   Pain Descriptors / Indicators Aching   Pain Frequency Constant          [facility] PT Assessment - 11/26/14 1531    Assessment   Medical Diagnosis cervical pain   Prior Therapy none   Precautions   Precautions None   Restrictions   Weight Bearing Restrictions No   Balance Screen   Has the patient fallen in the past 6 months No   Has the patient had  a decrease in activity level because of a fear of falling?  No   Is the patient reluctant to leave their home because of a fear of falling?  No   Prior Function   Vocation On disability   Leisure coloring singing.    Observation/Other Assessments   Skin Integrity palpation mild to moderate mm spasm in upper trap B   Focus on Therapeutic Outcomes (FOTO)  43   Posture/Postural Control   Posture/Postural Control Postural limitations   Postural Limitations Rounded Shoulders;Forward head   AROM   Cervical Flexion decreased 20% reps increase pain    Cervical Extension wfl reps improve pain    Cervical - Right Side Bend  wfl reps increase sx    Cervical - Left Side Bend wfl no change    Cervical - Right Rotation wf;   Cervical - Left Rotation decreased 10%   Strength   Cervical Extension 4/5   Cervical - Right Side Bend 3+/5   Cervical - Left Side Bend 3+/5                 PT Short Term Goals - Dec 18, 2014 1608    PT SHORT TERM GOAL #1   Title I HEP   Time 1   Period Weeks   PT SHORT TERM GOAL #2   Title Pt ROM to be WNL  for safer driving   Time 2   Period Weeks   PT SHORT TERM GOAL #3   Title Pain level to be no greater than a 6/10    Time 2   Period Weeks           PT Long Term Goals - 18-Dec-2014 1610    PT LONG TERM GOAL #1   Title I in advance HEP   Time 4   Period Weeks   PT LONG TERM GOAL #2   Title Pt strength to be increased by one grade to allow pt pain to be lowered to no greater than a 4/10 80% of the day    Time 4   Period Weeks   PT LONG TERM GOAL #3   Title Pt to state that headaches have been decreased by 30 5   Time 4   Period Weeks               Plan - Dec 18, 2014 1604    Clinical Impression Statement Pt is a 48 yo female with chronic progressive cervical pain who has been referred to therapy to decrease her pain and improve her quality of life.  Examination demonstrates decreased ROM, decreased strength, increased pain and increased mm tension.  Michelle Clements will benefit from skilled PT to address these issues and decrease the patients pain level.    Pt will benefit from skilled therapeutic intervention in order to improve on the following deficits Pain;Decreased strength;Decreased range of motion;Increased muscle spasms;Increased fascial restricitons   PT Frequency 2x / week   PT Treatment/Interventions Therapeutic activities;Therapeutic exercise;Manual techniques   PT Next Visit Plan begin isometric ex for SB and extension, w back, x to v as well as manual for TP positioning and manual traciton.  Advance cervical stabilization as able           G-Codes - 12-18-14 1616    Functional Limitation Carrying, moving and handling objects   Carrying, Moving and Handling Objects Current Status (N4709) At least 40 percent but less than 60 percent impaired, limited or restricted   Carrying, Moving and Handling Objects Goal  Status [id number]) At least 40 percent but less than 60 percent impaired, limited or restricted       Problem List Patient Active Problem List   Diagnosis Date  Noted  . Migraine 10/28/2014  . Neck pain of over 3 months duration 10/28/2014  . 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 Headache(784.0) 11/06/2013  . Cervicalgia 11/06/2013  . Acute confusional state 11/06/2013  . History of stroke in prior 3 months 10/15/2013  . Neuropathy of upper extremity 04/23/2013  . Unspecified vitamin D deficiency 02/13/2009  . Morbid obesity 02/13/2009  . BIPOLAR AFFECTIVE DISORDER 11/19/2007  . Essential hypertension, benign 11/16/2007  . Mixed hyperlipidemia 10/03/2007  . Hx of tobacco use, presenting hazards to health 10/03/2007  . DEPRESSION 10/03/2007  . IBS 10/03/2007  . FIBROMYALGIA 10/03/2007  . Palpitations 10/03/2007    Cassara Nida,CINDY PT 11/26/2014, 4:18 PM  [place] [address] [place], [place], [address] Phone: [phone]   Fax:  [phone]

## 2014-12-01 ENCOUNTER — Ambulatory Visit (INDEPENDENT_AMBULATORY_CARE_PROVIDER_SITE_OTHER): Payer: Medicare HMO | Admitting: Neurology

## 2014-12-01 ENCOUNTER — Encounter: Payer: Self-pay | Admitting: Neurology

## 2014-12-01 VITALS — BP 115/78 | HR 72 | Temp 98.1°F | Ht 71.0 in | Wt 215.0 lb

## 2014-12-01 DIAGNOSIS — G43709 Chronic migraine without aura, not intractable, without status migrainosus: Secondary | ICD-10-CM

## 2014-12-01 DIAGNOSIS — G43719 Chronic migraine without aura, intractable, without status migrainosus: Secondary | ICD-10-CM

## 2014-12-01 NOTE — Progress Notes (Signed)
BOTOX PROCEDURE NOTE FOR MIGRAINE HEADACHE   HISTORY: Michelle Clements is a 48 y.o. female here as a follow up for migraines and neck pain. Migraines for years. She was on Topiramate and had to stop, she was fatigued, SOB, confusion, bowel and urinary problems. 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 She feels so much better since going off of the Topiramate for headache and pain. She lost 65 pounds from November to May of this year because of the Topiramate. Appetite is improving since going off. She is having headaches every day for the last 6 months, +nausea, +light sensitivity, +sounds sensitivity. Daily, they are lasting all day up to 24 hours. Sometimes it gets up to 8/10 but on average a 4/10. They get worse during menses. Unknown triggers. Advil, tylenol don't help. Imitrex helped in the past but she can't take it every day. She has had reactions to multiple medications, she doesn't want to try medications again. Took Elavil for 6 months and it gave her severe constipation and worsened her OBS. Has tried Wellbutrin and other antidepressants, is on Cymbalta right now and doesn't help the headaches at all. No aura. Not intractable.   She continues to have pain in the neck. It goes out to her shoulder joints. She has pain in her shoulders. She is weak in her upper body strength. She has a lot of pain in her neck and shoulders. She has chronic pain due to fibromyalgia.   MRI cervical spine 03/2014:  Mildly progressive cervical spondylosis and degenerative disc disease, with moderate to prominent impingement at C5-6 and moderate impingement at C6-7 as detailed above.  MRI of the brain 10/2013:  IMPRESSION: Normal MRI of the brain and intracranial MR angiography of the large and medium size vessels.   Contraindications and precautions discussed with patient. Aseptic procedure was observed and patient tolerated procedure. Procedure  performed by Dr. Georgia Dom  The condition has existed for more than 6 months, and pt does not have a diagnosis of ALS, Myasthenia Gravis or Lambert-Eaton Syndrome. Risks and benefits of injections discussed and pt agrees to proceed with the procedure. Written consent obtained  These injections are medically necessary. He receives good benefits from these injections. These injections do not cause sedations or hallucinations which the oral therapies may cause.  Indication/Diagnosis: chronic migraine  Type of toxin: Botox  Lot # [id number] exp 03/2017 x two of these   Description of procedure:  The patient was placed in a sitting position. The standard protocol was used for Botox as follows, with 5 units of Botox injected at each site:   -Procerus muscle, midline injection  -Corrugator muscle, bilateral injection  -Frontalis muscle, bilateral injection, with 2 sites each side, medial injection was performed in the upper one third of the frontalis muscle, in the region vertical from the medial inferior edge of the superior orbital rim. The lateral injection was again in the upper one third of the forehead vertically above the lateral limbus of the cornea, 1.5 cm lateral to the medial injection site.  -Temporalis muscle injection, 4 sites, bilaterally. The first injection was 3 cm above the tragus of the ear, second injection site was 1.5 cm to 3 cm up from the first injection site in line with the tragus of the ear. The third injection site was 1.5-3 cm forward between the first 2 injection sites. The fourth injection site was 1.5 cm posterior to the second injection  site.  -Occipitalis muscle injection, 3 sites, bilaterally. The first injection was done one half way between the occipital protuberance and the tip of the mastoid process behind the ear. The second injection site was done lateral and superior to the first, 1 fingerbreadth from the first injection. The third injection site was 1  fingerbreadth superiorly and medially from the first injection site.  -Cervical paraspinal muscle injection, 2 sites, bilateral knee first injection site was 1 cm from the midline of the cervical spine, 3 cm inferior to the lower border of the occipital protuberance. The second injection site was 1.5 cm superiorly and laterally to the first injection site.  -Trapezius muscle injection was performed at 3 sites, bilaterally. The first injection site was in the upper trapezius muscle halfway between the inflection point of the neck, and the acromion. The second injection site was one half way between the acromion and the first injection site. The third injection was done between the first injection site and the inflection point of the neck.   A 200 unit bottle of Botox was used, 155 units were injected, the rest of the Botox was wasted. The patient tolerated the procedure well, there were no complications of the above procedure.

## 2014-12-03 ENCOUNTER — Encounter: Payer: Self-pay | Admitting: Neurology

## 2014-12-05 HISTORY — PX: DENTAL SURGERY: SHX609

## 2014-12-08 ENCOUNTER — Encounter (HOSPITAL_COMMUNITY): Payer: Self-pay | Admitting: Hematology & Oncology

## 2014-12-08 NOTE — Telephone Encounter (Signed)
[employer] Help desk sent this information as received at incorrect location.  Have forwarded a copy to Dr. Whitney Muse.

## 2014-12-09 ENCOUNTER — Ambulatory Visit (HOSPITAL_COMMUNITY)
Admission: RE | Admit: 2014-12-09 | Discharge: 2014-12-09 | Disposition: A | Discharge status: Home / Self Care | Payer: Commercial Managed Care - HMO | Source: Ambulatory Visit | Attending: Internal Medicine | Admitting: Internal Medicine

## 2014-12-09 ENCOUNTER — Encounter (HOSPITAL_COMMUNITY): Payer: Self-pay | Admitting: Hematology & Oncology

## 2014-12-09 DIAGNOSIS — M436 Torticollis: Secondary | ICD-10-CM | POA: Diagnosis not present

## 2014-12-09 DIAGNOSIS — M542 Cervicalgia: Secondary | ICD-10-CM | POA: Diagnosis not present

## 2014-12-09 DIAGNOSIS — G44229 Chronic tension-type headache, not intractable: Secondary | ICD-10-CM | POA: Diagnosis not present

## 2014-12-09 DIAGNOSIS — M5382 Other specified dorsopathies, cervical region: Secondary | ICD-10-CM | POA: Insufficient documentation

## 2014-12-09 NOTE — Therapy (Signed)
[place], [place], [address] Phone: [phone]   Fax:  [phone]  Physical Therapy Treatment  Patient Details  Name: Michelle Clements MRN: [id number] Date of Birth: 1966/02/01  Encounter Date: 12/09/2014      PT End of Session - 12/09/14 1205    Visit Number 2   Number of Visits 8   Date for PT Re-Evaluation 12/26/14   Authorization Type [employer]   Authorization - Visit Number 2   Authorization - Number of Visits 10   PT Start Time 1108   PT Stop Time 1150   PT Time Calculation (min) 42 min      Past Medical History  Diagnosis Date  . Bipolar disorder   . Hip pain, left   . Essential hypertension, benign   . Neck pain   . Allergic rhinitis   . Fibromyalgia   . Constipation   . IBS (irritable bowel syndrome)   . Hyperlipidemia   . GERD (gastroesophageal reflux disease)   . Depression   . Asthma   . PTSD (post-traumatic stress disorder)   . Compulsive behavior disorder   . Type 2 diabetes mellitus   . Sleep apnea     Wears CPAP at bedtime  . History of pneumonia 1988  . Poor short term memory   . Urgency of urination   . Arthritis     Lower back and hips  . Anemia   . History of palpitations     Negative Holter monitor    Past Surgical History  Procedure Laterality Date  . Cholecystectomy    . Tubal ligation    . Muscle biopsy  2008    Right leg  . Carpal tunnel release Right 06/10/2013    Procedure: CARPAL TUNNEL RELEASE;  Surgeon: Faythe Ghee, MD;  Location: [facility];  Service: Neurosurgery;  Laterality: Right;  Right Carpal Tunnel Release   . Mole exc      x 3    There were no vitals taken for this visit.  Visit Diagnosis:  Chronic tension-type headache, not intractable  Cervical pain  Neck stiffness  Weakness of neck      Subjective Assessment - 12/09/14 1118    Symptoms Pt states her neck is not hurting as bad today as when she came for her initial  evaluation.  States she has been complaint with HEP and has gotten a botox shot for her headache which has helped.   Currently in Pain? Yes   Pain Score 3    Pain Location Neck   Pain Orientation Right                    [facility] Adult PT Treatment/Exercise - 12/09/14 1127    Neck Exercises: Seated   Cervical Isometrics 5 secs;10 reps;Right lateral flexion;Left lateral flexion;Extension   Neck Retraction 10 reps   X to V 10 reps   W Back 10 reps   Shoulder Shrugs 10 reps   Other Seated Exercise scapular retraction; cervical extension x 10    Manual Therapy   Manual Therapy Manual Traction   Manual Traction supine                   PT Short Term Goals - 11/26/14 1608    PT SHORT TERM GOAL #1   Title I HEP   Time 1   Period Weeks   PT SHORT TERM GOAL #2  Title Pt ROM to be WNL for safer driving   Time 2   Period Weeks   PT SHORT TERM GOAL #3   Title Pain level to be no greater than a 6/10    Time 2   Period Weeks           PT Long Term Goals - 11/26/14 1610    PT LONG TERM GOAL #1   Title I in advance HEP   Time 4   Period Weeks   PT LONG TERM GOAL #2   Title Pt strength to be increased by one grade to allow pt pain to be lowered to no greater than a 4/10 80% of the day    Time 4   Period Weeks   PT LONG TERM GOAL #3   Title Pt to state that headaches have been decreased by 30 5   Time 4   Period Weeks               Plan - 12/09/14 1206    Clinical Impression Statement Pt with overall improvments.  Added new exercises added per PT POC with good form and no pain voiced.   Completed gentle manual traction and suboccipital release with good relaxation noted.  Pt reported being painfree at end of session.   PT Next Visit Plan Assess effectiveness of traction next visit.   Advance cervical stabilization as able         Problem List Patient Active Problem List   Diagnosis Date Noted  . Migraine 10/28/2014  . Neck pain of over 3  months duration 10/28/2014  . 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 Headache(784.0) 11/06/2013  . Cervicalgia 11/06/2013  . Acute confusional state 11/06/2013  . History of stroke in prior 3 months 10/15/2013  . Neuropathy of upper extremity 04/23/2013  . Unspecified vitamin D deficiency 02/13/2009  . Morbid obesity 02/13/2009  . BIPOLAR AFFECTIVE DISORDER 11/19/2007  . Essential hypertension, benign 11/16/2007  . Mixed hyperlipidemia 10/03/2007  . Hx of tobacco use, presenting hazards to health 10/03/2007  . DEPRESSION 10/03/2007  . IBS 10/03/2007  . FIBROMYALGIA 10/03/2007  . Palpitations 10/03/2007    Teena Irani, PTA/CLT [phone] 12/09/2014, 12:14 PM  [facility] [address] [place], [place], [address] Phone: [phone]   Fax:  [phone]

## 2014-12-11 ENCOUNTER — Telehealth (HOSPITAL_COMMUNITY): Payer: Self-pay

## 2014-12-11 DIAGNOSIS — D839 Common variable immunodeficiency, unspecified: Secondary | ICD-10-CM

## 2014-12-11 NOTE — Telephone Encounter (Addendum)
Spoke with patient and she had her dental procedure done today.  Stated that the dentist had to drill down into my jawbone and just wanted to let us know.  Also, questions if being on antibiotic would have any impact on her labs being done next week.  Scheduled to have CBC/diff, CMET and Immunofixation studies on 12/16/14 during office visit.

## 2014-12-12 ENCOUNTER — Ambulatory Visit (HOSPITAL_COMMUNITY): Payer: Self-pay | Admitting: Hematology & Oncology

## 2014-12-12 ENCOUNTER — Other Ambulatory Visit (HOSPITAL_COMMUNITY): Payer: Self-pay

## 2014-12-12 ENCOUNTER — Telehealth (HOSPITAL_COMMUNITY): Payer: Self-pay

## 2014-12-12 ENCOUNTER — Ambulatory Visit (HOSPITAL_COMMUNITY): Payer: Commercial Managed Care - HMO | Admitting: Physical Therapy

## 2014-12-12 NOTE — Telephone Encounter (Signed)
Had mouth surgery and forgot to call in and cancel her appointment

## 2014-12-16 ENCOUNTER — Encounter (HOSPITAL_COMMUNITY): Payer: Commercial Managed Care - HMO | Attending: Hematology and Oncology | Admitting: Hematology & Oncology

## 2014-12-16 ENCOUNTER — Encounter (HOSPITAL_COMMUNITY): Payer: Self-pay | Admitting: Hematology & Oncology

## 2014-12-16 ENCOUNTER — Ambulatory Visit (HOSPITAL_COMMUNITY)
Admission: RE | Admit: 2014-12-16 | Discharge: 2014-12-16 | Disposition: A | Discharge status: Home / Self Care | Payer: Commercial Managed Care - HMO | Source: Ambulatory Visit | Attending: Internal Medicine | Admitting: Internal Medicine

## 2014-12-16 ENCOUNTER — Encounter: Payer: Self-pay | Admitting: *Deleted

## 2014-12-16 ENCOUNTER — Encounter (HOSPITAL_BASED_OUTPATIENT_CLINIC_OR_DEPARTMENT_OTHER): Payer: Commercial Managed Care - HMO

## 2014-12-16 VITALS — BP 134/82 | HR 87 | Temp 99.0°F | Resp 16 | Wt 216.4 lb

## 2014-12-16 DIAGNOSIS — D839 Common variable immunodeficiency, unspecified: Secondary | ICD-10-CM | POA: Diagnosis not present

## 2014-12-16 DIAGNOSIS — G44229 Chronic tension-type headache, not intractable: Secondary | ICD-10-CM

## 2014-12-16 DIAGNOSIS — M542 Cervicalgia: Secondary | ICD-10-CM | POA: Diagnosis not present

## 2014-12-16 DIAGNOSIS — M5382 Other specified dorsopathies, cervical region: Secondary | ICD-10-CM

## 2014-12-16 DIAGNOSIS — M436 Torticollis: Secondary | ICD-10-CM

## 2014-12-16 LAB — COMPREHENSIVE METABOLIC PANEL
ALBUMIN: 4.5 g/dL (ref 3.5–5.2)
ALT: 27 U/L (ref 0–35)
ANION GAP: 6 (ref 5–15)
AST: 23 U/L (ref 0–37)
Alkaline Phosphatase: 49 U/L (ref 39–117)
BUN: 13 mg/dL (ref 6–23)
CALCIUM: 9.6 mg/dL (ref 8.4–10.5)
CO2: 27 mmol/L (ref 19–32)
Chloride: 106 mEq/L (ref 96–112)
Creatinine, Ser: 0.86 mg/dL (ref 0.50–1.10)
GFR calc Af Amer: >90 mL/min (ref >90)
GFR calc non Af Amer: 80 mL/min — ABNORMAL LOW (ref >90)
Glucose, Bld: 76 mg/dL (ref 70–99)
Potassium: 4 mmol/L (ref 3.5–5.1)
Sodium: 139 mmol/L (ref 135–145)
Total Bilirubin: 0.5 mg/dL (ref 0.3–1.2)
Total Protein: 7.2 g/dL (ref 6.0–8.3)

## 2014-12-16 LAB — CBC WITH DIFFERENTIAL/PLATELET
BASOS ABS: 0.1 10*3/uL (ref 0.0–0.1)
BASOS PCT: 1 % (ref 0–1)
Eosinophils Absolute: 0.3 10*3/uL (ref 0.0–0.7)
Eosinophils Relative: 3 % (ref 0–5)
HEMATOCRIT: 40.2 % (ref 36.0–46.0)
Hemoglobin: 13.9 g/dL (ref 12.0–15.0)
LYMPHS PCT: 24 % (ref 12–46)
Lymphs Abs: 2.4 10*3/uL (ref 0.7–4.0)
MCH: 32.1 pg (ref 26.0–34.0)
MCHC: 34.6 g/dL (ref 30.0–36.0)
MCV: 92.8 fL (ref 78.0–100.0)
Monocytes Absolute: 0.7 10*3/uL (ref 0.1–1.0)
Monocytes Relative: 7 % (ref 3–12)
NEUTROS ABS: 6.5 10*3/uL (ref 1.7–7.7)
NEUTROS PCT: 65 % (ref 43–77)
PLATELETS: 350 10*3/uL (ref 150–400)
RBC: 4.33 MIL/uL (ref 3.87–5.11)
RDW: 12.4 % (ref 11.5–15.5)
WBC: 10 10*3/uL (ref 4.0–10.5)

## 2014-12-16 NOTE — Assessment & Plan Note (Signed)
Michelle Clements has had problems with depression off an on, she is not sure if some of her physical problems are depression related. We have asked Lavella Hammock to meet with her today and she is willing to sit down and talk with Lavella Hammock.

## 2014-12-16 NOTE — Assessment & Plan Note (Signed)
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 She had problems with recurrent sinus and pulmonary infection. She was treated with 1 dose of IVIG and tolerated it well. We discussed ongoing therapy today and I have advised her that she will know whether or not IVIG is benefiting her after several treatments. IgG levels are currently pending today. I advised her I would keep her apprised of the results of that lab when it becomes available. We will plan on setting her up for an additional treatment in the next week. I would like to see her back again in one month. We will then move forward with monthly IVIG if she is tolerating it well with ongoing assessment of benefit.

## 2014-12-16 NOTE — Progress Notes (Signed)
[place] Clinical Social Work  Clinical Social Work was referred by Medical sales representative for assessment of psychosocial needs due to issues with depression.  Clinical Social Worker met with patient at [facility] to offer support and assess for needs.  Pt very open to meeting with CSW today. Pt reports she struggles with anxiety and depression currently. She reports she was diagnosed with bipolar in 2009 and was hospitalized for medication management and stabilization. She share that things had been going pretty well until recent events led to increased anxiety and depression. She would like assistance processing and sorting out these concerns.   Pt reports she has not seen a psychiatrist in a few years, but is willing to get a new provider. CSW reviewed options to obtain in-network provider through her [facility]. Pt agrees to pursue. Pt denies SI currently and reports to have good support at home. Pt willing to see therapist, but cannot currently afford this service. CSW educated pt about Counseling Interns at [facility] and she is very interested and agrees to follow up. Pt aware and agrees to seek out additional CSW assistance as needed. Pt has CSW number and Counseling Intern contact as well.    Clinical Social Work interventions: Resource education Supportive listening Motivational interviewing.   Loren Racer, [place] Tuesdays 8:30-1pm Wednesdays 8:30-12pm  [phone]

## 2014-12-16 NOTE — Therapy (Addendum)
[place], [place], [address] Phone: [phone]   Fax:  [phone]  Physical Therapy Treatment  Patient Details  Name: Michelle Clements MRN: [id number] Date of Birth: 1966/12/04 Referring Provider:  Delphina Cahill, MD  Encounter Date: 12/16/2014      PT End of Session - 12/16/14 0910    Visit Number 3   Number of Visits 8   Date for PT Re-Evaluation 12/26/14   Authorization Type [employer]   Authorization - Visit Number 3   Authorization - Number of Visits 10   PT Start Time 0845   PT Stop Time 0930   PT Time Calculation (min) 45 min   Activity Tolerance Patient tolerated treatment well   Behavior During Therapy [facility] for tasks assessed/performed      Past Medical History  Diagnosis Date  . Bipolar disorder   . Hip pain, left   . Essential hypertension, benign   . Neck pain   . Allergic rhinitis   . Fibromyalgia   . Constipation   . IBS (irritable bowel syndrome)   . Hyperlipidemia   . GERD (gastroesophageal reflux disease)   . Depression   . Asthma   . PTSD (post-traumatic stress disorder)   . Compulsive behavior disorder   . Type 2 diabetes mellitus   . Sleep apnea     Wears CPAP at bedtime  . History of pneumonia 1988  . Poor short term memory   . Urgency of urination   . Arthritis     Lower back and hips  . Anemia   . History of palpitations     Negative Holter monitor    Past Surgical History  Procedure Laterality Date  . Cholecystectomy    . Tubal ligation    . Muscle biopsy  2008    Right leg  . Carpal tunnel release Right 06/10/2013    Procedure: CARPAL TUNNEL RELEASE;  Surgeon: Faythe Ghee, MD;  Location: [facility];  Service: Neurosurgery;  Laterality: Right;  Right Carpal Tunnel Release   . Mole exc      x 3    There were no vitals taken for this visit.  Visit Diagnosis:  Chronic tension-type headache, not intractable  Cervical pain  Neck stiffness  Weakness of  neck      Subjective Assessment - 12/16/14 0933    Symptoms Pt states she's been out due to jaw surgery.  STates she had an infection under her tooth.  Pt reports current pain of 6/10 in upper cervical region and compliance with HEP.   Currently in Pain? Yes   Pain Score 6    Pain Location Neck   Pain Orientation Right                    [facility] Adult PT Treatment/Exercise - 12/16/14 0856    Exercises   Exercises Neck   Neck Exercises: Machines for Strengthening   UBE (Upper Arm Bike) 4 minutes backward level 1   Neck Exercises: Seated   Cervical Isometrics 10 reps;3 secs   Cervical Isometrics Limitations lateral flexion bilateral, extension   Neck Retraction 15 reps   X to V 10 reps   W Back 10 reps   Shoulder Shrugs 10 reps   Other Seated Exercise scapular retraction; cervical extension x 10    Modalities   Modalities Traction;Moist Heat   Moist Heat Therapy   Number Minutes Moist Heat 12 Minutes  Moist Heat Location Other (comment)  cervical with traction   Traction   Type of Traction Cervical   Min (lbs) n/a   Max (lbs) 18   Hold Time static   Rest Time n/a   Time 12 with 4 steps at 20% (higher cervical)                  PT Short Term Goals - 11/26/14 1608    PT SHORT TERM GOAL #1   Title I HEP   Time 1   Period Weeks   PT SHORT TERM GOAL #2   Title Pt ROM to be WNL for safer driving   Time 2   Period Weeks   PT SHORT TERM GOAL #3   Title Pain level to be no greater than a 6/10    Time 2   Period Weeks           PT Long Term Goals - 11/26/14 1610    PT LONG TERM GOAL #1   Title I in advance HEP   Time 4   Period Weeks   PT LONG TERM GOAL #2   Title Pt strength to be increased by one grade to allow pt pain to be lowered to no greater than a 4/10 80% of the day    Time 4   Period Weeks   PT LONG TERM GOAL #3   Title Pt to state that headaches have been decreased by 30 5   Time 4   Period Weeks               Plan  - 12/16/14 0911    Clinical Impression Statement Pt with increased aggitation in cervical region today, possibly from jaw surgery last week.  Continued with cervical stabilization progression, advancing reps with therex.  pt able to complete in good form.  Added mechanical cervical traction today with MHP to decrease pain and compression.  Pt reported being painfree at end of session.  Pt instructed to drink more water today to decrease dehydration of muscles.     PT Next Visit Plan Assess effectiveness of mechanical traction next visit.   Advance cervical stabilization as able         Problem List Patient Active Problem List   Diagnosis Date Noted  . Migraine 10/28/2014  . Neck pain of over 3 months duration 10/28/2014  . 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 Headache(784.0) 11/06/2013  . Cervicalgia 11/06/2013  . Acute confusional state 11/06/2013  . History of stroke in prior 3 months 10/15/2013  . Neuropathy of upper extremity 04/23/2013  . Unspecified vitamin D deficiency 02/13/2009  . Morbid obesity 02/13/2009  . BIPOLAR AFFECTIVE DISORDER 11/19/2007  . Essential hypertension, benign 11/16/2007  . Mixed hyperlipidemia 10/03/2007  . Hx of tobacco use, presenting hazards to health 10/03/2007  . DEPRESSION 10/03/2007  . IBS 10/03/2007  . FIBROMYALGIA 10/03/2007  . Palpitations 10/03/2007    Teena Irani, PTA/CLT [phone] 12/16/2014, 9:34 AM  [facility] [address] [place], [place], [address] Phone: [phone]   Fax:  [phone]

## 2014-12-16 NOTE — Progress Notes (Signed)
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 female returns for follow-up of her common variable immunodeficiency.  She did great with her IVIG. She is worried about depression/anxiety.  She has many nonspecific complaints.   She had a dental abscess and her procedure went well. She is currently on clindamycin.  She does get yearly mammograms. She has also had a colonoscopy. She also gets pap smears.  MEDICAL HISTORY: Past Medical History  Diagnosis Date  . Bipolar disorder   . Hip pain, left   . Essential hypertension, benign   . Neck pain   . Allergic rhinitis   . Fibromyalgia   . Constipation   . IBS (irritable bowel syndrome)   . Hyperlipidemia   . GERD (gastroesophageal reflux disease)   . Depression   . Asthma   . PTSD (post-traumatic stress disorder)   . Compulsive behavior disorder   . Type 2 diabetes mellitus   . Sleep apnea     Wears CPAP at bedtime  . History of pneumonia 1988  . Poor short term memory   . Urgency of urination   . Arthritis     Lower back and hips  . Anemia   . History of palpitations     Negative Holter monitor  . S/P Botox injection 11/2014     For migraine headaches    has Unspecified vitamin D deficiency; Mixed hyperlipidemia; Morbid obesity; BIPOLAR AFFECTIVE DISORDER; Hx of tobacco use, presenting hazards to health; DEPRESSION; Essential hypertension, benign; IBS; FIBROMYALGIA; Palpitations; Neuropathy of upper extremity; History of stroke in prior 3 months; Headache(784.0); Cervicalgia; Acute confusional state; Common variable immunodeficiency; Migraine; and Neck pain of over 3 months duration on her problem list.     No history exists.     is allergic to sulfonamide derivatives; gluten meal; molds  & smuts; clonazepam; and dust mite extract.  Michelle Clements does not currently have medications on file.  SURGICAL HISTORY: Past Surgical History  Procedure Laterality Date  . Cholecystectomy    . Tubal ligation    . Muscle biopsy  2008    Right leg  . Carpal tunnel release Right 06/10/2013    Procedure: CARPAL TUNNEL RELEASE;  Surgeon: Faythe Ghee, MD;  Location: [facility];  Service: Neurosurgery;  Laterality: Right;  Right Carpal Tunnel Release   . Mole exc      x 3  . Dental surgery  12/2014    SOCIAL HISTORY: History   Social History  . Marital Status: Married    Spouse Name: N/A    Number of Children: 2  . Years of Education: college   Occupational History  . 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 Smoking status: Former Smoker    Types: Cigarettes    Quit date: 11/04/2010  . Smokeless tobacco: Never Used  . Alcohol Use: No  . Drug Use: No  . Sexual Activity: Yes   Other Topics Concern  . Not on file   Social History Narrative   Married   Lives with spouse and daughter    FAMILY HISTORY: Family History  Problem Relation Age of Onset  . Fibromyalgia Mother   . Diabetes type II  Mother   . Depression Mother   . Alzheimer's disease Mother   . GER disease Mother   . Heart disease Mother   . Paranoid behavior Mother   . Dementia Mother   . Heart attack Father 31    MI x5  . Stroke Father     CVA x7  . Asthma Father   . Brain cancer Father   . Seizures Father   . Anxiety disorder Sister   . OCD Sister   . Sexual abuse Sister   . Physical abuse Sister   . Anxiety disorder Sister   . Alcohol abuse Neg Hx   . Drug abuse Neg Hx   . Schizophrenia Neg Hx   . ADD / ADHD Daughter   . ADD / ADHD Daughter     Review of Systems  Constitutional: Positive for malaise/fatigue. Negative for fever, chills, weight loss and diaphoresis.       Weight gain 9 lbs since November  HENT:  Negative for congestion, ear discharge, ear pain, hearing loss, nosebleeds, sore throat and tinnitus.        Are fewer since BOTOX injections.    Eyes: Negative for blurred vision, double vision, photophobia, pain, discharge and redness.       Glasses  Respiratory: Negative for cough, hemoptysis, sputum production, shortness of breath, wheezing and stridor.        At times she feels like she "cannot expand her lungs all the way"  Happens at rest  Cardiovascular: Negative.        Costocondritis   Gastrointestinal: Positive for constipation. Negative for heartburn, nausea, vomiting, abdominal pain, diarrhea, blood in stool and melena.  Genitourinary: Positive for frequency. Negative for dysuria, urgency, hematuria and flank pain.  Musculoskeletal: Positive for back pain, joint pain and neck pain. Negative for myalgias and falls.       Daily joint pain   Skin: Negative.   Neurological: Positive for dizziness and headaches. Negative for tingling, tremors, sensory change, speech change, focal weakness, seizures and loss of consciousness.       Questionable seizure as a child. Had an EEG in 2014 with Dr. Leonie Man at [facility]  Endo/Heme/Allergies: Negative.   Psychiatric/Behavioral: Positive for depression and memory loss. Negative for suicidal ideas, hallucinations and substance abuse. The patient is nervous/anxious and has insomnia.        History of "drug use" she reports hydrocodone and clonazepam use in the past.  She was admitted for 5 days to get off of them.  She was not overusing them but needing them routinely    PHYSICAL EXAMINATION  ECOG PERFORMANCE STATUS: 0 - Asymptomatic  Filed Vitals:   12/16/14 1100  BP: 134/82  Pulse: 87  Temp: 99 F (37.2 C)  Resp: 16    Physical Exam  Constitutional: She is oriented to person, place, and time and well-developed, well-nourished, and in no distress.  HENT:  Head: Normocephalic and atraumatic.  Nose: Nose normal.    Mouth/Throat: Oropharynx is clear and moist. No oropharyngeal exudate.  Eyes: Conjunctivae and EOM are normal. Pupils are equal, round, and reactive to light. Right eye exhibits no discharge. Left eye exhibits no discharge. No scleral icterus.  Neck: Normal range of motion. Neck supple. No tracheal deviation present. No thyromegaly present.  Cardiovascular: Normal rate, regular rhythm and normal heart sounds.  Exam reveals no gallop and no friction rub.   No murmur heard. Pulmonary/Chest: Effort normal and breath sounds normal. She has no wheezes. She  has no rales.  Abdominal: Soft. Bowel sounds are normal. She exhibits no distension and no mass. There is no tenderness. There is no rebound and no guarding.  Musculoskeletal: Normal range of motion. She exhibits no edema.  Lymphadenopathy:    She has no cervical adenopathy.  Neurological: She is alert and oriented to person, place, and time. She has normal reflexes. No cranial nerve deficit. Gait normal. Coordination normal.  Skin: Skin is warm and dry. No rash noted.  Psychiatric: Mood, memory, affect and judgment normal.  Nursing note and vitals reviewed.   LABORATORY DATA:  CBC    Component Value Date/Time   WBC 10.0 12/16/2014 1051   RBC 4.33 12/16/2014 1051   RBC 4.24 08/27/2014 0842   HGB 13.9 12/16/2014 1051   HCT 40.2 12/16/2014 1051   PLT 350 12/16/2014 1051   MCV 92.8 12/16/2014 1051   MCH 32.1 12/16/2014 1051   MCHC 34.6 12/16/2014 1051   RDW 12.4 12/16/2014 1051   LYMPHSABS 2.4 12/16/2014 1051   MONOABS 0.7 12/16/2014 1051   EOSABS 0.3 12/16/2014 1051   BASOSABS 0.1 12/16/2014 1051   CMP     Component Value Date/Time   NA 139 12/16/2014 1051   K 4.0 12/16/2014 1051   CL 106 12/16/2014 1051   CO2 27 12/16/2014 1051   GLUCOSE 76 12/16/2014 1051   BUN 13 12/16/2014 1051   CREATININE 0.86 12/16/2014 1051   CREATININE 0.88 02/21/2012 1153   CALCIUM 9.6 12/16/2014 1051   PROT 7.2 12/16/2014 1051   ALBUMIN 4.5  12/16/2014 1051   AST 23 12/16/2014 1051   ALT 27 12/16/2014 1051   ALKPHOS 49 12/16/2014 1051   BILITOT 0.5 12/16/2014 1051   GFRNONAA 80* 12/16/2014 1051   GFRAA >90 12/16/2014 1051        ASSESSMENT and THERAPY PLAN:    Common variable immunodeficiency This is a pleasant 49 year old female with IgG deficiency. She had problems with recurrent sinus and pulmonary infection. She was treated with 1 dose of IVIG and tolerated it well. We discussed ongoing therapy today and I have advised her that she will know whether or not IVIG is benefiting her after several treatments. IgG levels are currently pending today. I advised her I would keep her apprised of the results of that lab when it becomes available. We will plan on setting her up for an additional treatment in the next week. I would like to see her back again in one month. We will then move forward with monthly IVIG if she is tolerating it well with ongoing assessment of benefit.    All questions were answered. The patient knows to call the clinic with any problems, questions or concerns. We can certainly see the patient much sooner if necessary.   Molli Hazard 12/16/2014

## 2014-12-16 NOTE — Progress Notes (Signed)
Labs for cmp,cbcd,sifx

## 2014-12-16 NOTE — Progress Notes (Signed)
Created in error

## 2014-12-16 NOTE — Addendum Note (Signed)
Encounter addended by: Teena Irani, PTA on: 12/16/2014  9:35 AM<BR>     Documentation filed: Clinical Notes, Flowsheet VN

## 2014-12-16 NOTE — Patient Instructions (Signed)
[place] at [facility]  Discharge Instructions:  Please call with any problems or concerns prior to your next follow-up We will continue your IVIG every month Someone will call you with your lab results when they are all available We will see you back again in one month _______________________________________________________________  Thank you for choosing [place] at [facility] to provide your oncology and hematology care.  To afford each patient quality time with our providers, please arrive at least 15 minutes before your scheduled appointment.  You need to re-schedule your appointment if you arrive 10 or more minutes late.  We strive to give you quality time with our providers, and arriving late affects you and other patients whose appointments are after yours.  Also, if you no show three or more times for appointments you may be dismissed from the clinic.  Again, thank you for choosing [place] at [facility] hope is that these requests will allow you access to exceptional care and in a timely manner. _______________________________________________________________  If you have questions after your visit, please contact our office at [phone] between the hours of 8:30 a.m. and 5:00 p.m. Voicemails left after 4:30 p.m. will not be returned until the following business day. _______________________________________________________________  For prescription refill requests, have your pharmacy contact our office. _______________________________________________________________  Recommendations made by the consultant and any test results will be sent to your referring physician. _______________________________________________________________

## 2014-12-17 ENCOUNTER — Other Ambulatory Visit (HOSPITAL_COMMUNITY): Payer: Self-pay | Admitting: Hematology & Oncology

## 2014-12-18 ENCOUNTER — Ambulatory Visit (HOSPITAL_COMMUNITY)
Admission: RE | Admit: 2014-12-18 | Discharge: 2014-12-18 | Disposition: A | Discharge status: Home / Self Care | Payer: Commercial Managed Care - HMO | Source: Ambulatory Visit | Attending: Neurology | Admitting: Neurology

## 2014-12-18 DIAGNOSIS — M436 Torticollis: Secondary | ICD-10-CM

## 2014-12-18 DIAGNOSIS — M542 Cervicalgia: Secondary | ICD-10-CM | POA: Diagnosis not present

## 2014-12-18 DIAGNOSIS — M5382 Other specified dorsopathies, cervical region: Secondary | ICD-10-CM

## 2014-12-18 DIAGNOSIS — G44229 Chronic tension-type headache, not intractable: Secondary | ICD-10-CM

## 2014-12-18 LAB — IMMUNOFIXATION ELECTROPHORESIS
IGA: 69 mg/dL (ref 69–380)
IGG (IMMUNOGLOBIN G), SERUM: 762 mg/dL (ref 690–1700)
IGM, SERUM: 35 mg/dL — AB (ref 52–322)
Total Protein ELP: 7.2 g/dL (ref 6.0–8.3)

## 2014-12-18 NOTE — Therapy (Signed)
[place], [place], [address] Phone: [phone]   Fax:  [phone]  Physical Therapy Treatment  Patient Details  Name: Michelle Clements MRN: [id number] Date of Birth: Jul 27, 1966 Referring Provider:  Melvenia Beam, MD  Encounter Date: 12/18/2014      PT End of Session - 12/18/14 0932    Visit Number 4   Number of Visits 8   Date for PT Re-Evaluation 12/26/14   Authorization Type [employer]   Authorization - Visit Number 4   Authorization - Number of Visits 10   PT Start Time 0845   PT Stop Time 0930   PT Time Calculation (min) 45 min      Past Medical History  Diagnosis Date  . Bipolar disorder   . Hip pain, left   . Essential hypertension, benign   . Neck pain   . Allergic rhinitis   . Fibromyalgia   . Constipation   . IBS (irritable bowel syndrome)   . Hyperlipidemia   . GERD (gastroesophageal reflux disease)   . Depression   . Asthma   . PTSD (post-traumatic stress disorder)   . Compulsive behavior disorder   . Type 2 diabetes mellitus   . Sleep apnea     Wears CPAP at bedtime  . History of pneumonia 1988  . Poor short term memory   . Urgency of urination   . Arthritis     Lower back and hips  . Anemia   . History of palpitations     Negative Holter monitor  . S/P Botox injection 11/2014     For migraine headaches    Past Surgical History  Procedure Laterality Date  . Cholecystectomy    . Tubal ligation    . Muscle biopsy  2008    Right leg  . Carpal tunnel release Right 06/10/2013    Procedure: CARPAL TUNNEL RELEASE;  Surgeon: Faythe Ghee, MD;  Location: [facility];  Service: Neurosurgery;  Laterality: Right;  Right Carpal Tunnel Release   . Mole exc      x 3  . Dental surgery  12/2014    There were no vitals taken for this visit.  Visit Diagnosis:  Chronic tension-type headache, not intractable  Cervical pain  Neck stiffness  Weakness of neck       Subjective Assessment - 12/18/14 0852    Symptoms Pt states that she was really sore from the traction and is still sore   Pain Score 7                     [facility] Adult PT Treatment/Exercise - 12/18/14 0001    Exercises   Exercises Neck   Neck Exercises: Stretches   Other Neck Stretches posterior capsule stretch x 2 30" B   Neck Exercises: Machines for Strengthening   UBE (Upper Arm Bike) 3 min Level 2 backward    Neck Exercises: Theraband   Scapula Retraction 10 reps;Green   Shoulder Extension 10 reps;Green   Rows 10 reps;Green   Neck Exercises: Standing   Wall Push Ups 10 reps   Neck Exercises: Seated   X to V 10 reps   W Back 10 reps   W Back Weights (lbs) 2 #   Shoulder Shrugs 10 reps   Neck Exercises: Prone   Shoulder Extension 10 reps   Manual Therapy   Manual Therapy Massage;Other (comment)  manual cervical traction.  Massage --  prone to be able to get to scapular area.                   PT Short Term Goals - 11/26/14 1608    PT SHORT TERM GOAL #1   Title I HEP   Time 1   Period Weeks   PT SHORT TERM GOAL #2   Title Pt ROM to be WNL for safer driving   Time 2   Period Weeks   PT SHORT TERM GOAL #3   Title Pain level to be no greater than a 6/10    Time 2   Period Weeks           PT Long Term Goals - 11/26/14 1610    PT LONG TERM GOAL #1   Title I in advance HEP   Time 4   Period Weeks   PT LONG TERM GOAL #2   Title Pt strength to be increased by one grade to allow pt pain to be lowered to no greater than a 4/10 80% of the day    Time 4   Period Weeks   PT LONG TERM GOAL #3   Title Pt to state that headaches have been decreased by 30 5   Time 4   Period Weeks               Plan - 12/18/14 9179    Clinical Impression Statement Discontinued mechanical traction as therapist requested manual techniques along with manual traction not mechanical traction and mechanical traction has increased soreness.  Pt added  advanced scapular stability exercises.     PT Next Visit Plan continue with manual as well as scapular stabilization may begin UE matrix        Problem List Patient Active Problem List   Diagnosis Date Noted  . Migraine 10/28/2014  . Neck pain of over 3 months duration 10/28/2014  . 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 Headache(784.0) 11/06/2013  . Cervicalgia 11/06/2013  . Acute confusional state 11/06/2013  . History of stroke in prior 3 months 10/15/2013  . Neuropathy of upper extremity 04/23/2013  . Unspecified vitamin D deficiency 02/13/2009  . Morbid obesity 02/13/2009  . BIPOLAR AFFECTIVE DISORDER 11/19/2007  . Essential hypertension, benign 11/16/2007  . Mixed hyperlipidemia 10/03/2007  . Hx of tobacco use, presenting hazards to health 10/03/2007  . DEPRESSION 10/03/2007  . IBS 10/03/2007  . FIBROMYALGIA 10/03/2007  . Palpitations 10/03/2007    Heriberto Stmartin,CINDY PT 12/18/2014, 9:36 AM  [place] [address] [place], [place], [address] Phone: [phone]   Fax:  [phone]

## 2014-12-19 ENCOUNTER — Ambulatory Visit (HOSPITAL_COMMUNITY): Payer: Commercial Managed Care - HMO | Admitting: Physical Therapy

## 2014-12-23 ENCOUNTER — Ambulatory Visit (HOSPITAL_COMMUNITY)
Admission: RE | Admit: 2014-12-23 | Discharge: 2014-12-23 | Disposition: A | Discharge status: Home / Self Care | Payer: Commercial Managed Care - HMO | Source: Ambulatory Visit | Attending: Neurology | Admitting: Neurology

## 2014-12-23 DIAGNOSIS — M436 Torticollis: Secondary | ICD-10-CM

## 2014-12-23 DIAGNOSIS — M5382 Other specified dorsopathies, cervical region: Secondary | ICD-10-CM

## 2014-12-23 DIAGNOSIS — G44229 Chronic tension-type headache, not intractable: Secondary | ICD-10-CM

## 2014-12-23 DIAGNOSIS — M542 Cervicalgia: Secondary | ICD-10-CM

## 2014-12-23 NOTE — Therapy (Signed)
[place], [place], [address] Phone: [phone]   Fax:  [phone]  Physical Therapy Treatment  Patient Details  Name: Michelle Clements MRN: [id number] Date of Birth: [birth year] Referring Provider:  Melvenia Beam, MD  Encounter Date: 12/23/2014      PT End of Session - 12/23/14 0949    Visit Number 5   Number of Visits 8   Date for PT Re-Evaluation 12/26/14   Authorization Type [employer]   Authorization - Visit Number 5   Authorization - Number of Visits 10   PT Start Time [phone]   PT Stop Time 0943   PT Time Calculation (min) 45 min   Activity Tolerance Patient tolerated treatment well   Behavior During Therapy [facility] for tasks assessed/performed      Past Medical History  Diagnosis Date  . Bipolar disorder   . Hip pain, left   . Essential hypertension, benign   . Neck pain   . Allergic rhinitis   . Fibromyalgia   . Constipation   . IBS (irritable bowel syndrome)   . Hyperlipidemia   . GERD (gastroesophageal reflux disease)   . Depression   . Asthma   . PTSD (post-traumatic stress disorder)   . Compulsive behavior disorder   . Type 2 diabetes mellitus   . Sleep apnea     Wears CPAP at bedtime  . History of pneumonia 1988  . Poor short term memory   . Urgency of urination   . Arthritis     Lower back and hips  . Anemia   . History of palpitations     Negative Holter monitor  . S/P Botox injection 11/2014     For migraine headaches    Past Surgical History  Procedure Laterality Date  . Cholecystectomy    . Tubal ligation    . Muscle biopsy  2008    Right leg  . Carpal tunnel release Right 06/10/2013    Procedure: CARPAL TUNNEL RELEASE;  Surgeon: Faythe Ghee, MD;  Location: [facility];  Service: Neurosurgery;  Laterality: Right;  Right Carpal Tunnel Release   . Mole exc      x 3  . Dental surgery  12/2014    There were no vitals taken for this visit.  Visit Diagnosis:   Chronic tension-type headache, not intractable  Cervical pain  Neck stiffness  Weakness of neck      Subjective Assessment - 12/23/14 0858    Symptoms Pt stated her Left shoulder pain increases believes following UBE, reports generalized soreness   Currently in Pain? Yes   Pain Score 6    Pain Location Shoulder   Pain Orientation Left   Pain Descriptors / Indicators Aching;Sore                    [facility] Adult PT Treatment/Exercise - 12/23/14 0920    Exercises   Exercises Neck   Neck Exercises: Stretches   Upper Trapezius Stretch 2 reps;30 seconds   Corner Stretch 3 reps;30 seconds   Corner Stretch Limitations 3 levels   Other Neck Stretches posterior capsule stretch x 2 30" B   Neck Exercises: Machines for Strengthening   UBE (Upper Arm Bike) Held this session due to increased c/o   Neck Exercises: Theraband   Scapula Retraction 10 reps;Green   Shoulder Extension 10 reps;Green   Rows 10 reps;Green   Neck Exercises: Standing   Wall Push Ups  10 reps   Wall Push Ups Limitations with fist for wrist extension impairments   Other Standing Exercises UE overhead matrix 2#   Neck Exercises: Seated   X to V 10 reps   W Back 10 reps   W Back Weights (lbs) 2 #   Other Seated Exercise 3D thoracic excursion   Neck Exercises: Prone   Shoulder Extension 10 reps   Rows 10 reps   Manual Therapy   Manual Therapy Massage   Massage prone STM focus on upper, mid traps, levator scapula, rhomboids                  PT Short Term Goals - 12/23/14 0956    PT SHORT TERM GOAL #1   Title I HEP   Status On-going   PT SHORT TERM GOAL #2   Title Pt ROM to be WNL for safer driving   Status On-going   PT SHORT TERM GOAL #3   Title Pain level to be no greater than a 6/10    Status On-going           PT Long Term Goals - 12/23/14 0956    PT LONG TERM GOAL #1   Title I in advance HEP   PT LONG TERM GOAL #2   Title Pt strength to be increased by one grade to allow  pt pain to be lowered to no greater than a 4/10 80% of the day    Status On-going   PT LONG TERM GOAL #3   Title Pt to state that headaches have been decreased by 30 5   Status On-going               Plan - 12/23/14 0949    Clinical Impression Statement Held UBE following reports of increased pain in Lt shoulder following.  Progressed scapular stabilization this session with UE overhead dumbbell matrix and 3D thoracic excursion.  Added upper traps and pectoralis stretches to improve flexibilty and continued manual techniques to Bil upper and middle traps, kevator scapula, rhomboids and teres major.minor.  Pt educated on importance of hydration to reduce risk of headaches.  Pt stated pain reduced at end of session.     PT Next Visit Plan continue with manual as well as scapular stabilization.  Continue progressing prone exercises and begin UE ground matrix when ready for scapular stabilization.        Problem List Patient Active Problem List   Diagnosis Date Noted  . Migraine 10/28/2014  . Neck pain of over 3 months duration 10/28/2014  . 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 Headache(784.0) 11/06/2013  . Cervicalgia 11/06/2013  . Acute confusional state 11/06/2013  . History of stroke in prior 3 months 10/15/2013  . Neuropathy of upper extremity 04/23/2013  . Unspecified vitamin D deficiency 02/13/2009  . Morbid obesity 02/13/2009  . BIPOLAR AFFECTIVE DISORDER 11/19/2007  . Essential hypertension, benign 11/16/2007  . Mixed hyperlipidemia 10/03/2007  . Hx of tobacco use, presenting hazards to health 10/03/2007  . DEPRESSION 10/03/2007  . IBS 10/03/2007  . FIBROMYALGIA 10/03/2007  . Palpitations 10/03/2007   Ihor Austin, [place]  Aldona Lento 12/23/2014, 9:58 AM  [place], [place], [address] Phone: [phone]   Fax:  [phone]

## 2014-12-24 ENCOUNTER — Encounter (HOSPITAL_COMMUNITY): Payer: Commercial Managed Care - HMO

## 2014-12-25 ENCOUNTER — Encounter (HOSPITAL_BASED_OUTPATIENT_CLINIC_OR_DEPARTMENT_OTHER): Payer: Commercial Managed Care - HMO

## 2014-12-25 ENCOUNTER — Encounter (HOSPITAL_COMMUNITY): Payer: Commercial Managed Care - HMO

## 2014-12-25 DIAGNOSIS — D839 Common variable immunodeficiency, unspecified: Secondary | ICD-10-CM

## 2014-12-25 MED ORDER — DIPHENHYDRAMINE HCL 25 MG PO CAPS
25.0000 mg | ORAL_CAPSULE | Freq: Once | ORAL | Status: AC
  Administered 2014-12-25: 25 mg via ORAL

## 2014-12-25 MED ORDER — IMMUNE GLOBULIN (HUMAN) 20 GM/200ML IV SOLN
0.4000 g/kg | Freq: Once | INTRAVENOUS | Status: AC
Start: 2014-12-25 — End: 2014-12-25
  Administered 2014-12-25: 40 g via INTRAVENOUS
  Filled 2014-12-25: qty 400

## 2014-12-25 MED ORDER — SODIUM CHLORIDE 0.9 % IV SOLN
Freq: Once | INTRAVENOUS | Status: AC
  Administered 2014-12-25: 250 mL via INTRAVENOUS

## 2014-12-25 MED ORDER — DIPHENHYDRAMINE HCL 25 MG PO CAPS
ORAL_CAPSULE | ORAL | Status: AC
  Filled 2014-12-25: qty 1

## 2014-12-25 MED ORDER — ACETAMINOPHEN 325 MG PO TABS
650.0000 mg | ORAL_TABLET | Freq: Four times a day (QID) | ORAL | Status: DC | PRN
  Administered 2014-12-25: 650 mg via ORAL

## 2014-12-25 MED ORDER — ACETAMINOPHEN 325 MG PO TABS
ORAL_TABLET | ORAL | Status: AC
  Filled 2014-12-25: qty 2

## 2014-12-25 MED ORDER — SODIUM CHLORIDE 0.9 % IJ SOLN: 10.0000 mL | INTRAMUSCULAR | Status: DC | PRN

## 2014-12-25 NOTE — Progress Notes (Signed)
Patient tolerated infusion well.

## 2014-12-25 NOTE — Patient Instructions (Signed)
[place] at [facility] Discharge Instructions  RECOMMENDATIONS MADE BY THE CONSULTANT AND ANY TEST RESULTS WILL BE SENT TO YOUR REFERRING PHYSICIAN.  Today you received IVIG. Please follow up as scheduled.   Thank you for choosing [place] at [facility] to provide your oncology and hematology care.  To afford each patient quality time with our provider, please arrive at least 15 minutes before your scheduled appointment time.    You need to re-schedule your appointment should you arrive 10 or more minutes late.  We strive to give you quality time with our providers, and arriving late affects you and other patients whose appointments are after yours.  Also, if you no show three or more times for appointments you may be dismissed from the clinic at the providers discretion.     Again, thank you for choosing [facility].  Our hope is that these requests will decrease the amount of time that you wait before being seen by our physicians.       _____________________________________________________________  Should you have questions after your visit to [facility], please contact our office at [phone] between the hours of 8:30 a.m. and 4:30 p.m.  Voicemails left after 4:30 p.m. will not be returned until the following business day.  For prescription refill requests, have your pharmacy contact our office.

## 2014-12-26 ENCOUNTER — Ambulatory Visit (HOSPITAL_COMMUNITY): Payer: Commercial Managed Care - HMO | Admitting: Physical Therapy

## 2014-12-30 ENCOUNTER — Ambulatory Visit (HOSPITAL_COMMUNITY): Payer: Commercial Managed Care - HMO | Admitting: Physical Therapy

## 2014-12-30 DIAGNOSIS — D801 Nonfamilial hypogammaglobulinemia: Secondary | ICD-10-CM | POA: Insufficient documentation

## 2015-01-02 ENCOUNTER — Ambulatory Visit (HOSPITAL_COMMUNITY): Admission: RE | Admit: 2015-01-02 | Payer: Commercial Managed Care - HMO | Source: Ambulatory Visit

## 2015-01-06 ENCOUNTER — Encounter (HOSPITAL_COMMUNITY): Payer: Self-pay | Admitting: Physical Therapy

## 2015-01-21 ENCOUNTER — Encounter (HOSPITAL_COMMUNITY): Payer: Medicare HMO

## 2015-01-21 ENCOUNTER — Encounter (HOSPITAL_COMMUNITY): Payer: Medicare HMO | Attending: Hematology and Oncology | Admitting: Hematology & Oncology

## 2015-01-21 ENCOUNTER — Encounter (HOSPITAL_COMMUNITY): Payer: Self-pay | Admitting: Hematology & Oncology

## 2015-01-21 VITALS — BP 148/79 | HR 88 | Temp 98.1°F | Resp 16 | Wt 221.6 lb

## 2015-01-21 DIAGNOSIS — D839 Common variable immunodeficiency, unspecified: Secondary | ICD-10-CM | POA: Insufficient documentation

## 2015-01-21 NOTE — Patient Instructions (Signed)
[place] [facility] Discharge Instructions  RECOMMENDATIONS MADE BY THE CONSULTANT AND ANY TEST RESULTS WILL BE SENT TO YOUR REFERRING PHYSICIAN.  Exam and discussion by Dr. Whitney Muse.  Will check to see if there is any co-pay assistance for the IVIG.  Call office with any issues or concerns.  Follow-up in 2 months with labs and office visit.  Thank you for choosing [place] at [facility] to provide your oncology and hematology care.  To afford each patient quality time with our provider, please arrive at least 15 minutes before your scheduled appointment time.    You need to re-schedule your appointment should you arrive 10 or more minutes late.  We strive to give you quality time with our providers, and arriving late affects you and other patients whose appointments are after yours.  Also, if you no show three or more times for appointments you may be dismissed from the clinic at the providers discretion.     Again, thank you for choosing [facility].  Our hope is that these requests will decrease the amount of time that you wait before being seen by our physicians.       _____________________________________________________________  Should you have questions after your visit to [facility], please contact our office at [phone] between the hours of 8:30 a.m. and 4:30 p.m.  Voicemails left after 4:30 p.m. will not be returned until the following business day.  For prescription refill requests, have your pharmacy contact our office.

## 2015-01-21 NOTE — Progress Notes (Signed)
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 female returns for follow-up of her common variable immunodeficiency.  She did great with her IVIG. She is unable to afford the co-pay for IVIG which she states is $800 a month. He is very disappointed in this, she states she intelligent improvement in how she feels.  She has minor concerns today, some left rib pain, some fatigue which is chronic, but otherwise is doing fairly well.    MEDICAL HISTORY: Past Medical History  Diagnosis Date  . Bipolar disorder   . Hip pain, left   . Essential hypertension, benign   . Neck pain   . Allergic rhinitis   . Fibromyalgia   . Constipation   . IBS (irritable bowel syndrome)   . Hyperlipidemia   . GERD (gastroesophageal reflux disease)   . Depression   . Asthma   . PTSD (post-traumatic stress disorder)   . Compulsive behavior disorder   . Type 2 diabetes mellitus   . Sleep apnea     Wears CPAP at bedtime  . History of pneumonia 1988  . Poor short term memory   . Urgency of urination   . Arthritis     Lower back and hips  . Anemia   . History of palpitations     Negative Holter monitor  . S/P Botox injection 11/2014     For migraine headaches    has Unspecified vitamin D deficiency; Mixed hyperlipidemia; Morbid obesity; BIPOLAR AFFECTIVE DISORDER; Hx of tobacco use, presenting hazards to health; DEPRESSION; Essential hypertension, benign; IBS; FIBROMYALGIA; Palpitations; Neuropathy of upper extremity; History of stroke in prior 3 months; Headache(784.0); Cervicalgia; Acute confusional state; Common variable immunodeficiency; Migraine; and Neck pain of over 3 months duration on her problem list.     No history exists.     is allergic to sulfonamide  derivatives; gluten meal; molds & smuts; clonazepam; and dust mite extract.  Ms. Pfefferle had no medications administered during this visit.  SURGICAL HISTORY: Past Surgical History  Procedure Laterality Date  . Cholecystectomy    . Tubal ligation    . Muscle biopsy  2008    Right leg  . Carpal tunnel release Right 06/10/2013    Procedure: CARPAL TUNNEL RELEASE;  Surgeon: Faythe Ghee, MD;  Location: [facility];  Service: Neurosurgery;  Laterality: Right;  Right Carpal Tunnel Release   . Mole exc      x 3  . Dental surgery  12/2014    SOCIAL HISTORY: History   Social History  . Marital Status: Married    Spouse Name: N/A  . Number of Children: 2  . Years of Education: college   Occupational History  . 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 Smoking status: Former Smoker    Types: Cigarettes    Quit date: 11/04/2010  . Smokeless tobacco: Never Used  . Alcohol Use: No  . Drug Use: No  . Sexual Activity: Yes   Other Topics Concern  . Not on file   Social History Narrative   Married   Lives with spouse and daughter    FAMILY HISTORY: Family History  Problem Relation Age of Onset  .  Fibromyalgia Mother   . Diabetes type II Mother   . Depression Mother   . Alzheimer's disease Mother   . GER disease Mother   . Heart disease Mother   . Paranoid behavior Mother   . Dementia Mother   . Heart attack Father 31    MI x5  . Stroke Father     CVA x7  . Asthma Father   . Brain cancer Father   . Seizures Father   . Anxiety disorder Sister   . OCD Sister   . Sexual abuse Sister   . Physical abuse Sister   . Anxiety disorder Sister   . Alcohol abuse Neg Hx   . Drug abuse Neg Hx   . Schizophrenia Neg Hx   . ADD / ADHD Daughter   . ADD / ADHD Daughter     Review of Systems  Constitutional: Positive for malaise/fatigue.  HENT: Negative.   Eyes: Negative.   Respiratory: Negative.     Cardiovascular: Negative.   Gastrointestinal: Negative.   Genitourinary: Negative.   Musculoskeletal: Positive for joint pain.       L rib pain  Skin: Negative.   Neurological: Negative.   Endo/Heme/Allergies: Negative.   Psychiatric/Behavioral: Negative.        Mood today is reported as good    PHYSICAL EXAMINATION  ECOG PERFORMANCE STATUS: 0 - Asymptomatic  Filed Vitals:   01/21/15 0911  BP: 148/79  Pulse: 88  Temp: 98.1 F (36.7 C)  Resp: 16    Physical Exam  Constitutional: She is oriented to person, place, and time and well-developed, well-nourished, and in no distress.  HENT:  Head: Normocephalic and atraumatic.  Nose: Nose normal.  Mouth/Throat: Oropharynx is clear and moist. No oropharyngeal exudate.  Eyes: Conjunctivae and EOM are normal. Pupils are equal, round, and reactive to light. Right eye exhibits no discharge. Left eye exhibits no discharge. No scleral icterus.  Neck: Normal range of motion. Neck supple. No tracheal deviation present. No thyromegaly present.  Cardiovascular: Normal rate, regular rhythm and normal heart sounds.  Exam reveals no gallop and no friction rub.   No murmur heard. Pulmonary/Chest: Effort normal and breath sounds normal. She has no wheezes. She has no rales.  Abdominal: Soft. Bowel sounds are normal. She exhibits no distension and no mass. There is no tenderness. There is no rebound and no guarding.  Musculoskeletal: Normal range of motion. She exhibits no edema.  Lymphadenopathy:    She has no cervical adenopathy.  Neurological: She is alert and oriented to person, place, and time. She has normal reflexes. No cranial nerve deficit. Gait normal. Coordination normal.  Skin: Skin is warm and dry. No rash noted.  Psychiatric: Mood, memory, affect and judgment normal.  Nursing note and vitals reviewed.   LABORATORY DATA:  CBC    Component Value Date/Time   WBC 10.0 12/16/2014 1051   RBC 4.33 12/16/2014 1051   RBC 4.24  08/27/2014 0842   HGB 13.9 12/16/2014 1051   HCT 40.2 12/16/2014 1051   PLT 350 12/16/2014 1051   MCV 92.8 12/16/2014 1051   MCH 32.1 12/16/2014 1051   MCHC 34.6 12/16/2014 1051   RDW 12.4 12/16/2014 1051   LYMPHSABS 2.4 12/16/2014 1051   MONOABS 0.7 12/16/2014 1051   EOSABS 0.3 12/16/2014 1051   BASOSABS 0.1 12/16/2014 1051   CMP     Component Value Date/Time   NA 139 12/16/2014 1051   K 4.0 12/16/2014 1051   CL 106 12/16/2014  1051   CO2 27 12/16/2014 1051   GLUCOSE 76 12/16/2014 1051   BUN 13 12/16/2014 1051   CREATININE 0.86 12/16/2014 1051   CREATININE 0.88 02/21/2012 1153   CALCIUM 9.6 12/16/2014 1051   PROT 7.2 12/16/2014 1051   ALBUMIN 4.5 12/16/2014 1051   AST 23 12/16/2014 1051   ALT 27 12/16/2014 1051   ALKPHOS 49 12/16/2014 1051   BILITOT 0.5 12/16/2014 1051   GFRNONAA 80* 12/16/2014 1051   GFRAA >90 12/16/2014 1051        ASSESSMENT and THERAPY PLAN:    Common variable immunodeficiency Pleasant 49 year old female with common variable immunodeficiency with recurrent sinus and pulmonary infections. She has received several treatments with IVIG and has tolerated them well. Last immunoglobulin levels were excellent. She has had no complaints in regards to sinus or pulmonary symptoms for the last 2 months which she reports is unusual for this time of the year. Fortunately her co-pay for each treatment is $800, she is unable to afford it. I have discussed patient assistance with her. We will investigated on our and in addition I have encouraged her to look online for resources. For now we will discontinue additional IVIG. I will see her back in 2 months with repeat laboratory studies including quantitative immunoglobulins.     All questions were answered. The patient knows to call the clinic with any problems, questions or concerns. We can certainly see the patient much sooner if necessary.   Molli Hazard 01/21/2015

## 2015-01-21 NOTE — Assessment & Plan Note (Signed)
Pleasant 49 year old female with common variable immunodeficiency with recurrent sinus and pulmonary infections. She has received several treatments with IVIG and has tolerated them well. Last immunoglobulin levels were excellent. She has had no complaints in regards to sinus or pulmonary symptoms for the last 2 months which she reports is unusual for this time of the year. Fortunately her co-pay for each treatment is $800, she is unable to afford it. I have discussed patient assistance with her. We will investigated on our and in addition I have encouraged her to look online for resources. For now we will discontinue additional IVIG. I will see her back in 2 months with repeat laboratory studies including quantitative immunoglobulins.

## 2015-01-22 ENCOUNTER — Encounter (HOSPITAL_COMMUNITY): Payer: Self-pay | Admitting: Physical Therapy

## 2015-01-29 ENCOUNTER — Ambulatory Visit: Payer: Medicare HMO | Admitting: Neurology

## 2015-02-04 NOTE — Therapy (Signed)
[place], [place], [address] Phone: [phone]   Fax:  [phone]  Patient Details  Name: Michelle Clements MRN: [id number] Date of Birth: [birth year] Referring Provider:  Melvenia Beam, MD  Encounter Date: 12/23/2014 PHYSICAL THERAPY DISCHARGE SUMMARY  Visits from Start of Care:5  Current functional level related to goals / functional outcomes: unknown   Remaining deficits: unknown   Education / Equipment: HEP  Plan: Patient agrees to discharge.  Patient goals were not met. Patient is being discharged due to not returning since the last visit.  ?????      RUSSELL,CINDY 02/04/2015, 4:15 PM  [place] [address] [place], [place], [address] Phone: [phone]   Fax:  [phone]

## 2015-02-04 NOTE — Addendum Note (Signed)
Encounter addended by: Leeroy Cha, PT on: 02/04/2015  4:31 PM<BR>     Documentation filed: Clinical Notes, Episodes

## 2015-02-18 ENCOUNTER — Other Ambulatory Visit (HOSPITAL_COMMUNITY): Payer: Self-pay | Admitting: Internal Medicine

## 2015-02-18 ENCOUNTER — Ambulatory Visit (HOSPITAL_COMMUNITY)
Admission: RE | Admit: 2015-02-18 | Discharge: 2015-02-18 | Disposition: A | Discharge status: Home / Self Care | Payer: Commercial Managed Care - HMO | Source: Ambulatory Visit | Attending: Internal Medicine | Admitting: Internal Medicine

## 2015-02-18 ENCOUNTER — Encounter (HOSPITAL_COMMUNITY): Payer: Medicare HMO

## 2015-02-18 DIAGNOSIS — S6992XA Unspecified injury of left wrist, hand and finger(s), initial encounter: Secondary | ICD-10-CM | POA: Diagnosis not present

## 2015-02-18 DIAGNOSIS — M79642 Pain in left hand: Secondary | ICD-10-CM | POA: Diagnosis present

## 2015-02-18 DIAGNOSIS — W228XXA Striking against or struck by other objects, initial encounter: Secondary | ICD-10-CM | POA: Insufficient documentation

## 2015-02-18 DIAGNOSIS — T1490XA Injury, unspecified, initial encounter: Secondary | ICD-10-CM

## 2015-03-03 ENCOUNTER — Ambulatory Visit: Payer: Medicare HMO | Admitting: Neurology

## 2015-03-04 ENCOUNTER — Ambulatory Visit: Payer: Medicare HMO | Admitting: Neurology

## 2015-03-12 ENCOUNTER — Encounter: Payer: Self-pay | Admitting: Neurology

## 2015-03-12 ENCOUNTER — Ambulatory Visit (INDEPENDENT_AMBULATORY_CARE_PROVIDER_SITE_OTHER): Payer: Commercial Managed Care - HMO | Admitting: Neurology

## 2015-03-12 VITALS — BP 137/87 | HR 75 | Temp 98.0°F | Ht 71.0 in | Wt 229.5 lb

## 2015-03-12 DIAGNOSIS — G43011 Migraine without aura, intractable, with status migrainosus: Secondary | ICD-10-CM

## 2015-03-12 DIAGNOSIS — G4733 Obstructive sleep apnea (adult) (pediatric): Secondary | ICD-10-CM | POA: Diagnosis not present

## 2015-03-12 DIAGNOSIS — R0683 Snoring: Secondary | ICD-10-CM | POA: Diagnosis not present

## 2015-03-12 DIAGNOSIS — G4719 Other hypersomnia: Secondary | ICD-10-CM | POA: Diagnosis not present

## 2015-03-12 NOTE — Progress Notes (Signed)
[facility]    Provider:  Dr Jaynee Eagles Referring Provider: Delphina Cahill, MD Primary Care Physician:  Delphina Cahill, MD  CC: Migraines and neck pain   Interval update 03/12/2015: Michelle Clements is a 49 y.o. female here as a follow up for migraines and neck pain. Migraines for years. She was on Topiramate and had to stop, she was fatigued, SOB, confusion, bowel and urinary problems, weight loss. She tried botox but can't afford it. Tried elavil but worsened her IBS. Has tried Wellbutrin and other antidepressants, is on Cymbalta right now and doesn't help the headaches at all.    She is having headaches every day for more than 6 months, +nausea, +light sensitivity, +sounds sensitivity. Daily, they are lasting all day up to 24 hours. Sometimes it gets up to 8/10 but on average a 4/10. Used to be on right side behind the eye but now more all over. They get worse during menses. Unknown triggers. She has OSA but does not used CPAP in years. She hasn't used the cpap for a year. No aura.   +snoring. +excessively tired during the day. Falling asleep during the day. Gained weight recently.    10/28/2014: [name] is a 49 y.o. female here as a follow up for migraines and neck pain. Migraines for years. She was on Topiramate and had to stop, she was fatigued, SOB, confusion, bowel and urinary problems. 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 She feels so much better since going off of the Topiramate for headache and pain. She lost 65 pounds from November to May of this year because of the Topiramate. Appetite is improving since going off. She is having headaches every day for the last 6 months, +nausea, +light sensitivity, +sounds sensitivity. Daily, they are lasting all day up to 24 hours. Sometimes it gets up to 8/10 but on average a 4/10. They get worse during menses. Unknown triggers. Advil, tylenol don't help. Imitrex helped in the  past but she can't take it every day. She has had reactions to multiple medications, she doesn't want to try medications again. Took Elavil for 6 months and it gave her severe constipation and worsened her OBS. Has tried Wellbutrin and other antidepressants, is on Cymbalta right now and doesn't help the headaches at all. No aura. Not intractable.   She continues to have pain in the neck. It goes out to her shoulder joints. She has pain in her shoulders. She is weak in her upper body strength. She has a lot of pain in her neck and shoulders. She has chronic pain due to fibromyalgia.   MRI cervical spine 03/2014:  Mildly progressive cervical spondylosis and degenerative disc disease, with moderate to prominent impingement at C5-6 and moderate impingement at C6-7 as detailed above.  MRI of the brain 10/2013:  IMPRESSION: Normal MRI of the brain and intracranial MR angiography of the large and medium size vessels.  ROS: appetite change, chills, fatigue, wt change, unexpected sweating, eye itching, eye redness, light sensitivity, eye pain, blurred vision, cold and heat intolerance, flushing, food allergies, facial swelling, SOB, trouble swallowing, abdominal pain, constipation, nausea, rectal pain, frequency of urination, rectal pain, urgency, memory loss, dizziness, headache, weakness, chest pain, leg swelling, joint pain, back pain, aching muscles, neck pain, neck stiffness, depression, nervous.anxious, sleep talking, daytime sleepiness, acting out. All others negative   Previous notes by Charlott Holler :   04/25/14 (LL): She returns for followup after her last  visit 3 months ago. Results of MRI of the neck showing mild degenerative changes at C5-6 and C6-7 but no severe compression requiring surgery at this time. She has tingling and numbness intermittently in her fingers and toes, has previous diagnosis of bilateral carpel tunnel and status-post release on right wrist. She was advised to increase her  Topamax to 100 mg in the morning and 200 at night. She is tolerating Topamax well. Her headaches have improved. She had previously been on Gabapentin for fibromyalgia and has stopped using it; feels much better off of it. Has started back on Cymbalta. She wants to get back to water zumba.   History   Social History  . Marital Status: Married    Spouse Name: N/A  . Number of Children: 2  . Years of Education: College   Occupational History  . Umemployed     Now disabled due to bipolar and fibromyalgia   Social History Main Topics  . Smoking status: Former Smoker    Types: Cigarettes    Quit date: 11/04/2010  . Smokeless tobacco: Never Used  . Alcohol Use: No  . Drug Use: No  . Sexual Activity: Yes   Other Topics Concern  . Not on file   Social History Narrative   Married   Lives with spouse and daughter   Right handed.   Caffeine use: 2 cups coffee in morning and 2 cups tea during the day     Family History  Problem Relation Age of Onset  . Fibromyalgia Mother   . Diabetes type II Mother   . Depression Mother   . Alzheimer's disease Mother   . GER disease Mother   . Heart disease Mother   . Paranoid behavior Mother   . Dementia Mother   . Heart attack Father 31    MI x5  . Stroke Father     CVA x7  . Asthma Father   . Brain cancer Father   . Seizures Father   . Anxiety disorder Sister   . OCD Sister   . Sexual abuse Sister   . Physical abuse Sister   . Anxiety disorder Sister   . Alcohol abuse Neg Hx   . Drug abuse Neg Hx   . Schizophrenia Neg Hx   . ADD / ADHD Daughter   . ADD / ADHD Daughter     Past Medical History  Diagnosis Date  . Bipolar disorder   . Hip pain, left   . Essential hypertension, benign   . Neck pain   . Allergic rhinitis   . Fibromyalgia   . Constipation   . IBS (irritable bowel syndrome)   . Hyperlipidemia   . GERD (gastroesophageal reflux disease)   . Depression   . Asthma   . PTSD (post-traumatic stress disorder)     . Compulsive behavior disorder   . Type 2 diabetes mellitus   . Sleep apnea     Wears CPAP at bedtime  . History of pneumonia 1988  . Poor short term memory   . Urgency of urination   . Arthritis     Lower back and hips  . Anemia   . History of palpitations     Negative Holter monitor  . S/P Botox injection 11/2014     For migraine headaches  . CVID (common variable immunodeficiency)     Past Surgical History  Procedure Laterality Date  . Cholecystectomy    . Tubal ligation    . Muscle biopsy  2008    Right leg  . Carpal tunnel release Right 06/10/2013    Procedure: CARPAL TUNNEL RELEASE;  Surgeon: Faythe Ghee, MD;  Location: [facility];  Service: Neurosurgery;  Laterality: Right;  Right Carpal Tunnel Release   . Mole exc      x 3  . Dental surgery  12/2014    Current Outpatient Prescriptions  Medication Sig Dispense Refill  . acetaminophen (TYLENOL) 500 MG tablet Take 1,000 mg by mouth every 6 (six) hours as needed for pain or fever.    Marland Kitchen albuterol (PROVENTIL HFA;VENTOLIN HFA) 108 (90 BASE) MCG/ACT inhaler Inhale 2 puffs into the lungs every 6 (six) hours as needed for wheezing or shortness of breath.    Marland Kitchen aspirin-acetaminophen-caffeine (EXCEDRIN MIGRAINE) 250-250-65 MG per tablet Take 2 tablets by mouth as needed.    . calcium & magnesium carbonates (MYLANTA) 311-232 MG per tablet Take 1 tablet by mouth daily.    . DULoxetine (CYMBALTA) 30 MG capsule Take 30 mg by mouth 2 (two) times daily. Takes 30 mg in am and 60 mg at HS    . EQ IBUPROFEN 200 MG CAPS Take 200 mg by mouth daily.     . fluticasone (FLONASE) 50 MCG/ACT nasal spray Place into both nostrils daily.    . hydrOXYzine (VISTARIL) 25 MG capsule Take 25 mg by mouth as needed.    Marland Kitchen ibuprofen (ADVIL,MOTRIN) 600 MG tablet Take 600 mg by mouth as needed.    . Lancets (ONETOUCH ULTRASOFT) lancets     . loratadine (CLARITIN) 10 MG tablet Take 10 mg by mouth daily.    . Melatonin 5 MG TABS Take 5 mg by mouth at  bedtime as needed.     . Multiple Vitamin (MULTIVITAMIN) tablet Take 1 tablet by mouth daily.    . Omega-3 Fatty Acids (FISH OIL) 1200 MG CAPS Take 1,200 mg by mouth daily.    Marland Kitchen omeprazole (PRILOSEC) 20 MG capsule 2 (two) times daily before a meal.     . ONE TOUCH ULTRA TEST test strip     . OVER THE COUNTER MEDICATION Take 1 capsule by mouth daily. Tumeric    . pravastatin (PRAVACHOL) 40 MG tablet Take 40 mg by mouth daily.    . Probiotic Product (PRO-BIOTIC BLEND) CAPS Take by mouth daily.    Marland Kitchen senna-docusate (STOOL SOFTENER LAXATIVE) 8.6-50 MG per tablet Take 2 tablets by mouth at bedtime.    . simethicone (MYLICON) 161 MG chewable tablet Chew 125 mg by mouth every 6 (six) hours as needed for flatulence.    Marland Kitchen tiZANidine (ZANAFLEX) 4 MG tablet Take 4 mg by mouth daily. 4mg  at bedtime and 2 mg during the day    . traMADol (ULTRAM) 50 MG tablet Take 50 mg by mouth as needed.     . B Complex Vitamins (VITAMIN B-COMPLEX PO) Take 5,000 mg by mouth daily.    . busPIRone (BUSPAR) 10 MG tablet Take 10 mg by mouth 2 (two) times daily.    Marland Kitchen co-enzyme Q-10 30 MG capsule Take 30 mg by mouth daily.    . tizanidine (ZANAFLEX) 2 MG capsule Take 4 mg by mouth as needed. Takes 4 mg at hs and 1/2 during day as neede    . [DISCONTINUED] buPROPion (WELLBUTRIN SR) 150 MG 12 hr tablet Take 1 tablet (150 mg total) by mouth 2 (two) times daily. 60 tablet 1   No current facility-administered medications for this visit.    Allergies as of  03/12/2015 - Review Complete 03/12/2015  Allergen Reaction Noted  . Sulfonamide derivatives Anaphylaxis 10/03/2007  . Gluten meal Itching 09/26/2014  . Molds & smuts  09/26/2014  . Clonazepam Other (See Comments) 10/01/2013  . Dust mite extract  09/26/2014    Vitals: BP 137/87 mmHg  Pulse 75  Temp(Src) 98 F (36.7 C)  Ht 5\' 11"  (1.803 m)  Wt 229 lb 8 oz (104.101 kg)  BMI 32.02 kg/m2  LMP 02/18/2015 Last Weight:  Wt Readings from Last 1 Encounters:  03/12/15 229 lb  8 oz (104.101 kg)   Last Height:   Ht Readings from Last 1 Encounters:  03/12/15 5\' 11"  (1.803 m)    Gen: NAD, conversant, well nourised, obese, well groomed    Neuro: Focused Neurologic Exam  Speech:    Speech is normal; fluent and spontaneous with normal comprehension.  Cognition:    The patient is oriented to person, place, and time;     recent and remote memory intact;     language fluent;     normal attention, concentration,     fund of knowledge Cranial Nerves:    The pupils are equal, round, and reactive to light. The fundi are normal and spontaneous venous pulsations are present. Visual fields are full to finger confrontation. Extraocular movements are intact. Trigeminal sensation is intact and the muscles of mastication are normal. The face is symmetric. The palate elevates in the midline. Hearing intact. Voice is normal. Shoulder shrug is normal. The tongue has normal motion without fasciculations.       Assessment/Plan:  49 year old patient with multiple complaints, PMHx of HTN, IBS, DM2, neuropathy, gluten enteropathy, common variable immunodeficiency disorder, Fibromyalgia, bipolar disorder, depression, HTN who is here for follow up of chronic migraine disorder and chronic neck pain. Neuro exam is non focal. Has tried and failed or not tolerated multiple migraine medications and can't afford botox. Will hold off on trying anymore medication and will order a sleep study for eval of OSA or hypoventilation obesity syndrome. For her chronic pain and fibromyalgia, she is on Cymbalta. She is going to increase her Cymbalta for pain, getting prior auth. And working with her pcp. She is in PT at [facility] for her neck pain. I think she could benefit from stress management and biofeedback, she should continue to follow with psychiatry and therapy.   Will refer to [facility] Sleep study eval FSS 61, ESS 17, Obese, daily headache, previous Dx of OSA - eval for OSA and  obesity hypoventilation syndrome   Sarina Ill, MD  [facility] [address] [place], [place] [address]  Phone [phone] Fax [phone]  A total of 15 minutes was spent face-to-face with this patient. Over half this time was spent on counseling patient on the migraine diagnosis and different diagnostic and therapeutic options available.

## 2015-03-12 NOTE — Patient Instructions (Addendum)
Overall you are doing fairly well but I do want to suggest a few things today:   Remember to drink plenty of fluid, eat healthy meals and do not skip any meals. Try to eat protein with a every meal and eat a healthy snack such as fruit or nuts in between meals. Try to keep a regular sleep-wake schedule and try to exercise daily, particularly in the form of walking, 20-30 minutes a day, if you can.   Sleep study, referral to [facility]  As far as diagnostic testing:  Our phone number is [phone]. We also have an after hours call service for urgent matters and there is a physician on-call for urgent questions. For any emergencies you know to call 911 or go to the nearest emergency room

## 2015-03-17 ENCOUNTER — Telehealth (HOSPITAL_COMMUNITY): Payer: Self-pay | Admitting: Hematology & Oncology

## 2015-03-17 NOTE — Telephone Encounter (Signed)
PC FROM PT ON 03/16/15 REQUESTING I CALL [employer] TO GET AN "EXCEPTION". I CALLED [employer] AND WAS TRANSFERRED 3 TIMES WITH NO RESOLUTION OF WHY I WAS ASKED TO CALL.  SUBMITTED A PRE AUTH REQUEST FOR IVIG ONLINE. SPOKE TO THE PT AT LENGTH ABOUT COPAYS, DEDS AND ETC

## 2015-03-18 ENCOUNTER — Other Ambulatory Visit (HOSPITAL_COMMUNITY)
Admission: RE | Admit: 2015-03-18 | Discharge: 2015-03-18 | Disposition: A | Discharge status: Home / Self Care | Payer: Commercial Managed Care - HMO | Source: Ambulatory Visit | Attending: Internal Medicine | Admitting: Internal Medicine

## 2015-03-18 ENCOUNTER — Encounter (HOSPITAL_COMMUNITY): Payer: Commercial Managed Care - HMO | Attending: Hematology and Oncology

## 2015-03-18 DIAGNOSIS — E782 Mixed hyperlipidemia: Secondary | ICD-10-CM | POA: Insufficient documentation

## 2015-03-18 DIAGNOSIS — E119 Type 2 diabetes mellitus without complications: Secondary | ICD-10-CM | POA: Diagnosis not present

## 2015-03-18 DIAGNOSIS — D839 Common variable immunodeficiency, unspecified: Secondary | ICD-10-CM | POA: Diagnosis not present

## 2015-03-18 LAB — CBC WITH DIFFERENTIAL/PLATELET
BASOS PCT: 1 % (ref 0–1)
Basophils Absolute: 0.1 10*3/uL (ref 0.0–0.1)
EOS PCT: 3 % (ref 0–5)
Eosinophils Absolute: 0.2 10*3/uL (ref 0.0–0.7)
HCT: 38.4 % (ref 36.0–46.0)
HEMOGLOBIN: 13.1 g/dL (ref 12.0–15.0)
Lymphocytes Relative: 27 % (ref 12–46)
Lymphs Abs: 2.2 10*3/uL (ref 0.7–4.0)
MCH: 30.5 pg (ref 26.0–34.0)
MCHC: 34.1 g/dL (ref 30.0–36.0)
MCV: 89.3 fL (ref 78.0–100.0)
MONOS PCT: 6 % (ref 3–12)
Monocytes Absolute: 0.5 10*3/uL (ref 0.1–1.0)
NEUTROS ABS: 5.4 10*3/uL (ref 1.7–7.7)
NEUTROS PCT: 63 % (ref 43–77)
Platelets: 322 10*3/uL (ref 150–400)
RBC: 4.3 MIL/uL (ref 3.87–5.11)
RDW: 12.9 % (ref 11.5–15.5)
WBC: 8.4 10*3/uL (ref 4.0–10.5)

## 2015-03-18 LAB — COMPREHENSIVE METABOLIC PANEL
ALT: 26 U/L (ref 0–35)
AST: 26 U/L (ref 0–37)
Albumin: 4.3 g/dL (ref 3.5–5.2)
Alkaline Phosphatase: 52 U/L (ref 39–117)
Anion gap: 7 (ref 5–15)
BILIRUBIN TOTAL: 0.7 mg/dL (ref 0.3–1.2)
BUN: 13 mg/dL (ref 6–23)
CALCIUM: 9.4 mg/dL (ref 8.4–10.5)
CHLORIDE: 102 mmol/L (ref 96–112)
CO2: 29 mmol/L (ref 19–32)
CREATININE: 0.85 mg/dL (ref 0.50–1.10)
GFR, EST NON AFRICAN AMERICAN: 81 mL/min — AB (ref >90)
Glucose, Bld: 117 mg/dL — ABNORMAL HIGH (ref 70–99)
POTASSIUM: 4 mmol/L (ref 3.5–5.1)
Sodium: 138 mmol/L (ref 135–145)
Total Protein: 7 g/dL (ref 6.0–8.3)

## 2015-03-18 NOTE — Progress Notes (Signed)
Labs drawn extra saved for Dr Nevada Crane in case he calls in

## 2015-03-19 LAB — IGG, IGA, IGM
IGG (IMMUNOGLOBIN G), SERUM: 701 mg/dL (ref 700–1600)
IgA: 63 mg/dL — ABNORMAL LOW (ref 87–352)
IgM, Serum: 36 mg/dL (ref 26–217)

## 2015-03-19 LAB — LIPID PANEL
CHOL/HDL RATIO: 4.2 ratio
Cholesterol: 250 mg/dL — ABNORMAL HIGH (ref 0–200)
HDL: 60 mg/dL (ref >39)
LDL Cholesterol: 146 mg/dL — ABNORMAL HIGH (ref 0–99)
Triglycerides: 222 mg/dL — ABNORMAL HIGH (ref <150)
VLDL: 44 mg/dL — AB (ref 0–40)

## 2015-03-20 LAB — HEMOGLOBIN A1C
HEMOGLOBIN A1C: 6.2 % — AB (ref 4.8–5.6)
MEAN PLASMA GLUCOSE: 131 mg/dL

## 2015-03-23 ENCOUNTER — Encounter (HOSPITAL_COMMUNITY): Payer: Self-pay | Admitting: Oncology

## 2015-03-23 ENCOUNTER — Encounter (HOSPITAL_COMMUNITY): Payer: Medicare HMO

## 2015-03-23 ENCOUNTER — Encounter (HOSPITAL_BASED_OUTPATIENT_CLINIC_OR_DEPARTMENT_OTHER): Payer: Commercial Managed Care - HMO | Admitting: Oncology

## 2015-03-23 VITALS — BP 136/74 | HR 78 | Temp 98.4°F | Resp 16 | Wt 230.7 lb

## 2015-03-23 DIAGNOSIS — D839 Common variable immunodeficiency, unspecified: Secondary | ICD-10-CM | POA: Diagnosis not present

## 2015-03-23 DIAGNOSIS — R61 Generalized hyperhidrosis: Secondary | ICD-10-CM | POA: Diagnosis not present

## 2015-03-23 NOTE — Assessment & Plan Note (Addendum)
Pleasant 49 year old female with common variable immunodeficiency with recurrent sinus and pulmonary infections. She has received two treatments with IVIG (11/13/2014 and 12/25/2014) and has tolerated them well. Last immunoglobulin levels were excellent. She has had no complaints in regards to sinus or pulmonary symptoms for the last 2 months which she reports is unusual for this time of the year. Unfortunately her co-pay for each treatment is $800, she is unable to afford it.  Her IgG level is WNL.  She has not been hospitalized nor needing significant antibiotics except for a tooth abscess needing antibiotic and tooth extraction.  She notes some drenching night sweats x months.  No documented weight loss.    Labs in 4 weeks: CBC diff, LDH, ESR, CRP, B2M, Immunoglobulins.  Labs in 8 weeks: CBC diff, CMET, Immunoglobulins  Return in 8 weeks for follow-up.  Will refer the patient to Dr. Doristine Mango, GI, for evaluation of dysphagia and sensation of "lump in throat."

## 2015-03-23 NOTE — Progress Notes (Signed)
Michelle Cahill, MD  [place] [address]  Common variable immunodeficiency - Plan: CBC with Differential, IgG, IgA, IgM, CBC with Differential, Comprehensive metabolic panel, IgG, IgA, IgM  Night sweats - Plan: CBC with Differential, Lactate dehydrogenase, Sedimentation rate, C-reactive protein, Beta 2 microglobuline, serum  CURRENT THERAPY: IVIG on 11/13/2014 and 12/25/2014  INTERVAL HISTORY: Michelle Clements 49 y.o. 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  I personally reviewed and went over laboratory results with the patient.  The results are noted within this dictation.  Immunoglobulin levels are stable.  She notes needing an antibiotic for a tooth abscess that needed resected by oral surgeon in Jan 2016.  Otherwise, no other antibiotic needs.   She notes that she feels good.  She does have a few complaints: 1. Dysphagia with solid foods such as steak.  She does not eat bread.  No issues with liquids.  I will refer the patient to Dr. Verlee Monte in [place], [place] as he is the patient's GI physician. 2. Intermittent nausea, etiology unclear.  On further questioning, she notes drenching night sweats x a few weeks/months.  CBC is unimpressive in that regard.  She reports that she has to change her night clothes and pillow case.  She wants to change her sheets on the bed, but her husband is asleep and she does not want to wake him.    Hematologically, she denies any complaints and ROS questioning is negative.   Past Medical History  Diagnosis Date  . Bipolar disorder   . Hip pain, left   . Essential hypertension, benign   . Neck pain   . Allergic rhinitis   . Fibromyalgia   . Constipation   . IBS (irritable bowel syndrome)   . Hyperlipidemia   . GERD (gastroesophageal reflux disease)   . Depression   . Asthma   .  PTSD (post-traumatic stress disorder)   . Compulsive behavior disorder   . Type 2 diabetes mellitus   . Sleep apnea     Wears CPAP at bedtime  . History of pneumonia 1988  . Poor short term memory   . Urgency of urination   . Arthritis     Lower back and hips  . Anemia   . History of palpitations     Negative Holter monitor  . S/P Botox injection 11/2014     For migraine headaches  . CVID (common variable immunodeficiency)     has Unspecified vitamin D deficiency; Mixed hyperlipidemia; Morbid obesity; BIPOLAR AFFECTIVE DISORDER; Hx of tobacco use, presenting hazards to health; DEPRESSION; Essential hypertension, benign; IBS; FIBROMYALGIA; Palpitations; Neuropathy of upper extremity; History of stroke in prior 3 months; Headache(784.0); Cervicalgia; Acute confusional state; Common variable immunodeficiency; Migraine; and Neck pain of over 3 months duration on her problem list.     is allergic to sulfonamide derivatives; gluten meal; molds & smuts; clonazepam; and dust mite extract.  Ms. Goostree does not currently have medications on file.  Past Surgical History  Procedure Laterality Date  . Cholecystectomy    . Tubal ligation    . Muscle biopsy  2008    Right leg  . Carpal tunnel release Right 06/10/2013    Procedure: CARPAL TUNNEL RELEASE;  Surgeon: Faythe Ghee, MD;  Location: [facility];  Service: Neurosurgery;  Laterality: Right;  Right Carpal Tunnel Release   . Mole  exc      x 3  . Dental surgery  12/2014    Denies any headaches, dizziness, double vision, fevers, chills, night sweats, nausea, vomiting, diarrhea, constipation, chest pain, heart palpitations, shortness of breath, blood in stool, black tarry stool, urinary pain, urinary burning, urinary frequency, hematuria.   PHYSICAL EXAMINATION  ECOG PERFORMANCE STATUS: 0 - Asymptomatic  Filed Vitals:   03/23/15 0943  BP: 136/74  Pulse: 78  Temp: 98.4 F (36.9 C)  Resp: 16    GENERAL:alert, no distress, well  nourished, well developed, comfortable, cooperative and smiling SKIN: skin color, texture, turgor are normal, no rashes or significant lesions HEAD: Normocephalic, No masses, lesions, tenderness or abnormalities EYES: normal, PERRLA, EOMI, Conjunctiva are pink and non-injected EARS: External ears normal OROPHARYNX:lips, buccal mucosa, and tongue normal and mucous membranes are moist  NECK: supple, no adenopathy, thyroid normal size, non-tender, without nodularity, no stridor, non-tender, trachea midline LYMPH:  no palpable lymphadenopathy, no hepatosplenomegaly BREAST:not examined LUNGS: clear to auscultation and percussion HEART: regular rate & rhythm, no murmurs, no gallops, S1 normal and S2 normal ABDOMEN:abdomen soft, normal bowel sounds, no masses or organomegaly and no hepatosplenomegaly BACK: Back symmetric, no curvature., No CVA tenderness EXTREMITIES:less then 2 second capillary refill, no joint deformities, effusion, or inflammation, no edema, no skin discoloration, no clubbing, no cyanosis  NEURO: alert & oriented x 3 with fluent speech, no focal motor/sensory deficits, gait normal   LABORATORY DATA: CBC    Component Value Date/Time   WBC 8.4 03/18/2015 0935   RBC 4.30 03/18/2015 0935   RBC 4.24 08/27/2014 0842   HGB 13.1 03/18/2015 0935   HCT 38.4 03/18/2015 0935   PLT 322 03/18/2015 0935   MCV 89.3 03/18/2015 0935   MCH 30.5 03/18/2015 0935   MCHC 34.1 03/18/2015 0935   RDW 12.9 03/18/2015 0935   LYMPHSABS 2.2 03/18/2015 0935   MONOABS 0.5 03/18/2015 0935   EOSABS 0.2 03/18/2015 0935   BASOSABS 0.1 03/18/2015 0935      Chemistry      Component Value Date/Time   NA 138 03/18/2015 0935   K 4.0 03/18/2015 0935   CL 102 03/18/2015 0935   CO2 29 03/18/2015 0935   BUN 13 03/18/2015 0935   CREATININE 0.85 03/18/2015 0935   CREATININE 0.88 02/21/2012 1153      Component Value Date/Time   CALCIUM 9.4 03/18/2015 0935   ALKPHOS 52 03/18/2015 0935   AST 26  03/18/2015 0935   ALT 26 03/18/2015 0935   BILITOT 0.7 03/18/2015 0935         ASSESSMENT AND PLAN:  Common variable immunodeficiency Pleasant 49 year old female with common variable immunodeficiency with recurrent sinus and pulmonary infections. She has received two treatments with IVIG (11/13/2014 and 12/25/2014) and has tolerated them well. Last immunoglobulin levels were excellent. She has had no complaints in regards to sinus or pulmonary symptoms for the last 2 months which she reports is unusual for this time of the year. Unfortunately her co-pay for each treatment is $800, she is unable to afford it.  Her IgG level is WNL.  She has not been hospitalized nor needing significant antibiotics except for a tooth abscess needing antibiotic and tooth extraction.  She notes some drenching night sweats x months.  No documented weight loss.    Labs in 4 weeks: CBC diff, LDH, ESR, CRP, B2M, Immunoglobulins.  Labs in 8 weeks: CBC diff, CMET, Immunoglobulins  Return in 8 weeks for follow-up.  Will refer the patient  to Dr. Doristine Mango, GI, for evaluation of dysphagia and sensation of "lump in throat."     THERAPY PLAN:  Continue surveillance with mini-work-up for lymphoma in setting of drenching night sweats with no weight loss and WNL CBC.  All questions were answered. The patient knows to call the clinic with any problems, questions or concerns. We can certainly see the patient much sooner if necessary.  Patient and plan discussed with Dr. Ancil Linsey and she is in agreement with the aforementioned.   This note is electronically signed by: Robynn Pane 03/23/2015 12:11 PM

## 2015-03-23 NOTE — Patient Instructions (Addendum)
[place] at [facility] Discharge Instructions  RECOMMENDATIONS MADE BY THE CONSULTANT AND ANY TEST RESULTS WILL BE SENT TO YOUR REFERRING PHYSICIAN.  Exam and discussion by Robynn Pane, PA-C  Labs in 4 weeks  Labs in 8 weeks 2 days prior to seeing Dr. Whitney Muse  Return to see Dr. Whitney Muse 8 weeks  Thank you for choosing [place] at [facility] to provide your oncology and hematology care.  To afford each patient quality time with our provider, please arrive at least 15 minutes before your scheduled appointment time.    You need to re-schedule your appointment should you arrive 10 or more minutes late.  We strive to give you quality time with our providers, and arriving late affects you and other patients whose appointments are after yours.  Also, if you no show three or more times for appointments you may be dismissed from the clinic at the providers discretion.     Again, thank you for choosing [facility].  Our hope is that these requests will decrease the amount of time that you wait before being seen by our physicians.       _____________________________________________________________  Should you have questions after your visit to [facility], please contact our office at [phone] between the hours of 8:30 a.m. and 4:30 p.m.  Voicemails left after 4:30 p.m. will not be returned until the following business day.  For prescription refill requests, have your pharmacy contact our office.

## 2015-03-25 ENCOUNTER — Encounter (HOSPITAL_COMMUNITY): Payer: Commercial Managed Care - HMO

## 2015-03-25 ENCOUNTER — Ambulatory Visit (HOSPITAL_COMMUNITY): Payer: Self-pay | Admitting: Hematology & Oncology

## 2015-03-26 ENCOUNTER — Telehealth (HOSPITAL_COMMUNITY): Payer: Self-pay | Admitting: Hematology & Oncology

## 2015-03-26 NOTE — Telephone Encounter (Signed)
AUTH APPRVD FOR IVIG 4/18-10/15/16*SILVERBACK

## 2015-04-10 ENCOUNTER — Encounter (HOSPITAL_COMMUNITY): Payer: Self-pay

## 2015-04-10 ENCOUNTER — Encounter (HOSPITAL_COMMUNITY): Payer: Commercial Managed Care - HMO | Attending: Hematology and Oncology

## 2015-04-10 VITALS — BP 126/74 | HR 83 | Temp 98.0°F | Resp 20 | Wt 229.4 lb

## 2015-04-10 DIAGNOSIS — D839 Common variable immunodeficiency, unspecified: Secondary | ICD-10-CM | POA: Diagnosis not present

## 2015-04-10 MED ORDER — DIPHENHYDRAMINE HCL 25 MG PO CAPS
25.0000 mg | ORAL_CAPSULE | Freq: Once | ORAL | Status: AC
  Administered 2015-04-10: 25 mg via ORAL
  Filled 2015-04-10: qty 1

## 2015-04-10 MED ORDER — HEPARIN SOD (PORK) LOCK FLUSH 100 UNIT/ML IV SOLN: 500.0000 [IU] | Freq: Once | INTRAVENOUS | Status: DC | PRN

## 2015-04-10 MED ORDER — SODIUM CHLORIDE 0.9 % IV SOLN
Freq: Once | INTRAVENOUS | Status: AC
  Administered 2015-04-10: 09:00:00 via INTRAVENOUS

## 2015-04-10 MED ORDER — IMMUNE GLOBULIN (HUMAN) 20 GM/200ML IV SOLN
40.0000 g | Freq: Once | INTRAVENOUS | Status: AC
  Administered 2015-04-10: 40 g via INTRAVENOUS
  Filled 2015-04-10: qty 400

## 2015-04-10 MED ORDER — ACETAMINOPHEN 325 MG PO TABS
650.0000 mg | ORAL_TABLET | Freq: Four times a day (QID) | ORAL | Status: DC | PRN
  Administered 2015-04-10: 650 mg via ORAL
  Filled 2015-04-10: qty 2

## 2015-04-10 NOTE — Progress Notes (Signed)
Michelle Clements Tolerated IVIG infusion well today Discharged ambulatory

## 2015-04-10 NOTE — Patient Instructions (Signed)
[place] at [facility]  Discharge Instructions:  IVIG today.  IVIG in 1 moth with lab work.  IVIG in 2 months with lab work and to see the doctor.  Please call the clinic if you have any questions or concerns  _______________________________________________________________  Thank you for choosing [place] at [facility] to provide your oncology and hematology care.  To afford each patient quality time with our providers, please arrive at least 15 minutes before your scheduled appointment.  You need to re-schedule your appointment if you arrive 10 or more minutes late.  We strive to give you quality time with our providers, and arriving late affects you and other patients whose appointments are after yours.  Also, if you no show three or more times for appointments you may be dismissed from the clinic.  Again, thank you for choosing [place] at [place] hope is that these requests will allow you access to exceptional care and in a timely manner. _______________________________________________________________  If you have questions after your visit, please contact our office at [phone] between the hours of 8:30 a.m. and 5:00 p.m. Voicemails left after 4:30 p.m. will not be returned until the following business day. _______________________________________________________________  For prescription refill requests, have your pharmacy contact our office. _______________________________________________________________  Recommendations made by the consultant and any test results will be sent to your referring physician. _______________________________________________________________

## 2015-04-20 ENCOUNTER — Other Ambulatory Visit (HOSPITAL_COMMUNITY): Payer: Self-pay

## 2015-04-29 ENCOUNTER — Ambulatory Visit (INDEPENDENT_AMBULATORY_CARE_PROVIDER_SITE_OTHER): Payer: Commercial Managed Care - HMO | Admitting: Neurology

## 2015-04-29 ENCOUNTER — Encounter: Payer: Self-pay | Admitting: Neurology

## 2015-04-29 VITALS — BP 108/70 | HR 62 | Resp 16 | Ht 71.0 in | Wt 232.0 lb

## 2015-04-29 DIAGNOSIS — G4719 Other hypersomnia: Secondary | ICD-10-CM

## 2015-04-29 DIAGNOSIS — R51 Headache: Secondary | ICD-10-CM | POA: Diagnosis not present

## 2015-04-29 DIAGNOSIS — G4761 Periodic limb movement disorder: Secondary | ICD-10-CM | POA: Diagnosis not present

## 2015-04-29 DIAGNOSIS — G4733 Obstructive sleep apnea (adult) (pediatric): Secondary | ICD-10-CM

## 2015-04-29 DIAGNOSIS — R519 Headache, unspecified: Secondary | ICD-10-CM

## 2015-04-29 DIAGNOSIS — R351 Nocturia: Secondary | ICD-10-CM

## 2015-04-29 NOTE — Progress Notes (Signed)
Subjective:    Patient ID: Michelle Clements is a 49 y.o. female.  HPI     Michelle Age, MD, PhD [facility] [address], [address] [place], [place] [address]  Dear Michelle Clements,   I saw your patient, Michelle Clements done, upon your kind request in my neurologic clinic today for initial consultation of her sleep disorder, in particular reevaluation of her obstructive sleep apnea. The patient is unaccompanied today. As you know, Michelle Clements is a 49 year old right-handed woman with a complex medical history of obesity, migraine headaches, irritable bowel syndrome, allergic rhinitis, fibromyalgia syndrome, hyperlipidemia, depression, asthma, PTSD, type 2 diabetes, and bipolar disorder, who was previously diagnosed with obstructive sleep apnea. Her sleep study was over 5 years ago, and prior sleep study results are not available for my review today. She was given a CPAP machine and used it but stopped using it when she got sick about a year ago. She says she lost about 60 pounds in 2015 when she got sick. She has since then regaining about 30 pounds. I reviewed your office note from 03/12/2015. You advised her to restart her CPAP. She has since then restarted. She uses a medium full face mask. She did not bring her machine today but believes her pressure is at 12 cm. She stopped using her CPAP as she felt uncomfortable. She felt like the pressure was too high. Since restarting CPAP she feels that she is sleeping a little bit better and her headaches are somewhat improved. She has on her own reduced her Cymbalta to 30 mg twice daily from previously 30 mg in the morning and 60 at night. She is noted to twitch in her sleep and kicks her legs according to her husband.  she does not have a family history of obstructive sleep apnea but suspects that her father may have had sleep apnea. She is sleepy during the day. Bedtime is around midnight typically. She falls asleep okay. Rise time is 6:30 and she  does not feel rested. She does not work. Does not drink alcohol. She quit smoking in 2011. She drinks about 2-4 cups of coffee per day and 1-2 glasses of green tea. She does not drink sodas. She feels that some of her daytime sleepiness may be due to medications. Her Epworth sleepiness score is 19 out of 24 today and her fatigue score is 60 out of 63 today.  Her Past Medical History Is Significant For: Past Medical History  Diagnosis Date  . Bipolar disorder   . Hip pain, left   . Essential hypertension, benign   . Neck pain   . Allergic rhinitis   . Fibromyalgia   . Constipation   . IBS (irritable bowel syndrome)   . Hyperlipidemia   . GERD (gastroesophageal reflux disease)   . Depression   . Asthma   . PTSD (post-traumatic stress disorder)   . Compulsive behavior disorder   . Type 2 diabetes mellitus   . Sleep apnea     Wears CPAP at bedtime  . History of pneumonia 1988  . Poor short term memory   . Urgency of urination   . Arthritis     Lower back and hips  . Anemia   . History of palpitations     Negative Holter monitor  . S/P Botox injection 11/2014     For migraine headaches  . CVID (common variable immunodeficiency)     Her Past Surgical History Is Significant For: Past Surgical  History  Procedure Laterality Date  . Cholecystectomy    . Tubal ligation    . Muscle biopsy  2008    Right leg  . Carpal tunnel release Right 06/10/2013    Procedure: CARPAL TUNNEL RELEASE;  Surgeon: Faythe Ghee, MD;  Location: [facility];  Service: Neurosurgery;  Laterality: Right;  Right Carpal Tunnel Release   . Mole exc      x 3  . Dental surgery  12/2014    Her Family History Is Significant For: Family History  Problem Relation Clements of Onset  . Fibromyalgia Mother   . Diabetes type II Mother   . Depression Mother   . Alzheimer's disease Mother   . GER disease Mother   . Heart disease Mother   . Paranoid behavior Mother   . Dementia Mother   . Heart attack Father 31     MI x5  . Stroke Father     CVA x7  . Asthma Father   . Brain cancer Father   . Seizures Father   . Anxiety disorder Sister   . OCD Sister   . Sexual abuse Sister   . Physical abuse Sister   . Anxiety disorder Sister   . Alcohol abuse Neg Hx   . Drug abuse Neg Hx   . Schizophrenia Neg Hx   . ADD / ADHD Daughter   . ADD / ADHD Daughter     Her Social History Is Significant For: History   Social History  . Marital Status: Married    Spouse Name: N/A  . Number of Children: 2  . Years of Education: College   Occupational History  . Umemployed     Now disabled due to bipolar and fibromyalgia   Social History Main Topics  . Smoking status: Former Smoker    Types: Cigarettes    Quit date: 11/04/2010  . Smokeless tobacco: Never Used  . Alcohol Use: No  . Drug Use: No  . Sexual Activity: Yes   Other Topics Concern  . None   Social History Narrative   Married   Lives with spouse and daughter   Right handed.   Caffeine use: 2 cups coffee in morning and 2 cups tea during the day     Her Allergies Are:  Allergies  Allergen Reactions  . Sulfonamide Derivatives Anaphylaxis     : Angioedema  . Gluten Meal Itching  . Molds & Smuts   . Clonazepam Other (See Comments)    Confusion and memory loss Caused nconfusion  . Dust Mite Extract   :   Her Current Medications Are:  Outpatient Encounter Prescriptions as of 04/29/2015  Medication Sig  . albuterol (PROVENTIL HFA;VENTOLIN HFA) 108 (90 BASE) MCG/ACT inhaler Inhale 2 puffs into the lungs every 6 (six) hours as needed for wheezing or shortness of breath.  Marland Kitchen aspirin-acetaminophen-caffeine (EXCEDRIN MIGRAINE) 250-250-65 MG per tablet Take 2 tablets by mouth as needed.  . B Complex Vitamins (VITAMIN B-COMPLEX PO) Take 5,000 mg by mouth daily.  . DULoxetine (CYMBALTA) 30 MG capsule Take 30 mg by mouth 2 (two) times daily. Takes 30 mg in am and 60 mg at HS  . EQ IBUPROFEN 200 MG CAPS Take 200 mg by mouth daily.   .  fluticasone (FLONASE) 50 MCG/ACT nasal spray Place into both nostrils daily.  . hydrOXYzine (VISTARIL) 25 MG capsule Take 25 mg by mouth as needed.  . Lancets (ONETOUCH ULTRASOFT) lancets   . loratadine (CLARITIN) 10 MG  tablet Take 10 mg by mouth daily.  . Melatonin 5 MG TABS Take 5 mg by mouth at bedtime as needed.   . Multiple Vitamin (MULTIVITAMIN) tablet Take 1 tablet by mouth daily.  . Omega-3 Fatty Acids (FISH OIL) 1200 MG CAPS Take 1,200 mg by mouth daily.  Marland Kitchen omeprazole (PRILOSEC) 20 MG capsule 2 (two) times daily before a meal.   . ONE TOUCH ULTRA TEST test strip   . OVER THE COUNTER MEDICATION Take 1 capsule by mouth daily. Tumeric  . pravastatin (PRAVACHOL) 40 MG tablet Take 40 mg by mouth daily.  Marland Kitchen tiZANidine (ZANAFLEX) 4 MG tablet Take 4 mg by mouth daily. 4mg  at bedtime and 2 mg during the day  . traMADol (ULTRAM) 50 MG tablet Take 50 mg by mouth as needed.   . [DISCONTINUED] acetaminophen (TYLENOL) 500 MG tablet Take 1,000 mg by mouth every 6 (six) hours as needed for pain or fever.  . [DISCONTINUED] busPIRone (BUSPAR) 10 MG tablet Take 10 mg by mouth 2 (two) times daily.  . [DISCONTINUED] calcium & magnesium carbonates (MYLANTA) 311-232 MG per tablet Take 1 tablet by mouth daily.  . [DISCONTINUED] co-enzyme Q-10 30 MG capsule Take 30 mg by mouth daily.  . [DISCONTINUED] ibuprofen (ADVIL,MOTRIN) 600 MG tablet Take 600 mg by mouth as needed.  . [DISCONTINUED] Polyethylene Glycol 3350 (MIRALAX PO) Take 17 g by mouth daily.  . [DISCONTINUED] Probiotic Product (PRO-BIOTIC BLEND) CAPS Take by mouth daily.  . [DISCONTINUED] senna-docusate (STOOL SOFTENER LAXATIVE) 8.6-50 MG per tablet Take 2 tablets by mouth at bedtime.  . [DISCONTINUED] simethicone (MYLICON) 300 MG chewable tablet Chew 125 mg by mouth every 6 (six) hours as needed for flatulence.  . [DISCONTINUED] tizanidine (ZANAFLEX) 2 MG capsule Take 4 mg by mouth as needed. Takes 4 mg at hs and 1/2 during day as neede   No  facility-administered encounter medications on file as of 04/29/2015.  :  Review of Systems:  Out of a complete 14 point review of systems, all are reviewed and negative with the exception of these symptoms as listed below:  Review of Systems  Constitutional: Positive for fatigue.       Weight gain   HENT: Positive for trouble swallowing.   Eyes:       Blurred vision, double vision  Respiratory: Positive for cough.   Gastrointestinal: Positive for constipation.  Endocrine:       Feeling hot, feeling cold, flushing   Musculoskeletal:       Joint pain, cramps, and aching muscles   Allergic/Immunologic: Positive for environmental allergies and food allergies.  Neurological: Positive for weakness and headaches.       Memory loss, trouble falling asleep, Insomnia, daytimes sleepiness, snoring, wakes up choking or gasping for air, wakes up many times during night, wakes up tired in the morning, takes naps during the day.   Psychiatric/Behavioral:       Depression, anxiety, not enough sleep, too much sleep, decreased energy, disinterest in activities, racing thoughts    Objective:  Neurologic Exam  Physical Exam Physical Examination:   Filed Vitals:   04/29/15 1045  BP: 108/70  Pulse: 62  Resp: 16    General Examination: The patient is a very pleasant 49 y.o. female in no acute distress. She appears well-developed and well-nourished and well groomed.   HEENT: Normocephalic, atraumatic, pupils are equal, round and reactive to light and accommodation. Funduscopic exam is normal with sharp disc margins noted. Extraocular tracking is good without limitation  to gaze excursion or nystagmus noted. Normal smooth pursuit is noted. Hearing is grossly intact. Tympanic membranes are clear bilaterally. Face is symmetric with normal facial animation and normal facial sensation. Speech is clear with no dysarthria noted. There is no hypophonia. There is no lip, neck/head, jaw or voice tremor. Neck is  supple with full range of passive and active motion. There are no carotid bruits on auscultation. Oropharynx exam reveals: mild mouth dryness, adequate dental hygiene and moderate airway crowding, due to thicker soft palate and larger tongue. Tonsils are small or even absent but she did not have a tonsillectomy. Mallampati is class II. Tongue protrudes centrally and palate elevates symmetrically.  neck size is 16-3/4 inches.   Chest: Clear to auscultation without wheezing, rhonchi or crackles noted.  Heart: S1+S2+0, regular and normal without murmurs, rubs or gallops noted.   Abdomen: Soft, non-tender and non-distended with normal bowel sounds appreciated on auscultation.  Extremities: There is no pitting edema in the distal lower extremities bilaterally. Pedal pulses are intact.  Skin: Warm and dry without trophic changes noted. There are no varicose veins.  Musculoskeletal: exam reveals no obvious joint deformities, tenderness or joint swelling or erythema.   Neurologically:  Mental status: The patient is awake, alert and oriented in all 4 spheres. Her immediate and remote memory, attention, language skills and fund of knowledge are appropriate. There is no evidence of aphasia, agnosia, apraxia or anomia. Speech is clear with normal prosody and enunciation. Thought process is linear. Mood is normal and affect is normal.  Cranial nerves II - XII are as described above under HEENT exam. In addition: shoulder shrug is normal with equal shoulder height noted. Motor exam: Normal bulk, strength and tone is noted. There is no drift, tremor or rebound. Romberg is negative. Reflexes are 2+ throughout, except for trace in both ankles. Babinski: Toes are flexor bilaterally. Fine motor skills and coordination: intact with normal finger taps, normal hand movements, normal rapid alternating patting, normal foot taps and normal foot agility.  Cerebellar testing: No dysmetria or intention tremor on finger to  nose testing. Heel to shin is unremarkable bilaterally. There is no truncal or gait ataxia.  Sensory exam: intact to light touch, pinprick, vibration, temperature sense in the upper and lower extremities.  Gait, station and balance: She stands easily. No veering to one side is noted. No leaning to one side is noted. Posture is Clements-appropriate and stance is narrow based. Gait shows normal stride length and normal pace. No problems turning are noted. She turns en bloc. Tandem walk is unremarkable.  Assessment and Plan:   In summary, CHEKESHA BEHLKE is a very pleasant 49 y.o.-year old female with a complex medical history of obesity, migraine headaches, irritable bowel syndrome, allergic rhinitis, fibromyalgia syndrome, hyperlipidemia, depression, asthma, PTSD, type 2 diabetes, and bipolar disorder, who was previously diagnosed with obstructive sleep apnea. Her original sleep study was over 5 years ago. Since then, she has had changes in her medications and weight changes. She stopped using her CPAP over a year ago but recently restarted it. She does not wake up rested and still has significant excessive daytime somnolence. She is also noted to kick in her sleep. I had a long chat with the patient about my findings and the diagnosis of OSA, its prognosis and treatment options. We talked about medical treatments, surgical interventions and non-pharmacological approaches. I explained in particular the risks and ramifications of untreated moderate to severe OSA, especially with respect to developing  cardiovascular disease down the Road, including congestive heart failure, difficult to treat hypertension, cardiac arrhythmias, or stroke. Even type 2 diabetes has, in part, been linked to untreated OSA. Symptoms of untreated OSA include daytime sleepiness, memory problems, mood irritability and mood disorder such as depression and anxiety, lack of energy, as well as recurrent headaches, especially morning headaches. We  talked about trying to maintain a healthy lifestyle in general, as well as the importance of weight control. I encouraged the patient to eat healthy, exercise daily and keep well hydrated, to keep a scheduled bedtime and wake time routine, to not skip any meals and eat healthy snacks in between meals. I advised the patient not to drive when feeling sleepy. I recommended the following at this time: sleep study with potential positive airway pressure titration. (We will score hypopneas at 4% and split the sleep study into diagnostic and treatment portion, if the estimated. 2 hour AHI is >15/h).   I explained the sleep test procedure to the patient and also outlined possible surgical and non-surgical treatment options of OSA, including the use of a custom-made dental device (which would require a referral to a specialist dentist or oral surgeon), upper airway surgical options, such as pillar implants, radiofrequency surgery, tongue base surgery, and UPPP (which would involve a referral to an ENT surgeon). Rarely, jaw surgery such as mandibular advancement may be considered.  I also explained the CPAP treatment option to the patient, who indicated that she would be willing to continue CPAP therapy if the need arises. I explained the importance of being compliant with PAP treatment, not only for insurance purposes but primarily to improve Her symptoms, and for the patient's long term health benefit, including to reduce Her cardiovascular risks. I answered all her questions today and the patient was in agreement. I would like to see her back after the sleep study is completed and encouraged her to call with any interim questions, concerns, problems or updates.   Thank you very much for allowing me to participate in the care of this nice patient. If I can be of any further assistance to you please do not hesitate to talk to me.   Sincerely,   Michelle Age, MD, PhD

## 2015-04-29 NOTE — Patient Instructions (Signed)
Based on your symptoms and your exam I believe you are still at risk for obstructive sleep apnea or OSA, and I think we should proceed with a sleep study to determine whether you do or do not have OSA and how severe it is. If you have more than mild OSA, I want you to consider treatment with CPAP. Please remember, the risks and ramifications of moderate to severe obstructive sleep apnea or OSA are: Cardiovascular disease, including congestive heart failure, stroke, difficult to control hypertension, arrhythmias, and even type 2 diabetes has been linked to untreated OSA. Sleep apnea causes disruption of sleep and sleep deprivation in most cases, which, in turn, can cause recurrent headaches, problems with memory, mood, concentration, focus, and vigilance. Most people with untreated sleep apnea report excessive daytime sleepiness, which can affect their ability to drive. Please do not drive if you feel sleepy.  I will see you back after your sleep study to go over the test results and where to go from there. We will call you after your sleep study and to set up an appointment at the time.   Our sleep lab administrative assistant, Angelina Sheriff will call you to schedule your sleep study. If you don't hear back from her by next week please feel free to call her at [phone]. This is her direct line and please leave a message with your phone number to call back if you get the voicemail box.

## 2015-05-06 ENCOUNTER — Ambulatory Visit (INDEPENDENT_AMBULATORY_CARE_PROVIDER_SITE_OTHER): Payer: Commercial Managed Care - HMO | Admitting: Psychiatry

## 2015-05-06 ENCOUNTER — Encounter (HOSPITAL_COMMUNITY): Payer: Self-pay | Admitting: Psychiatry

## 2015-05-06 VITALS — BP 104/64 | HR 86 | Ht 71.0 in | Wt 232.4 lb

## 2015-05-06 DIAGNOSIS — F332 Major depressive disorder, recurrent severe without psychotic features: Secondary | ICD-10-CM

## 2015-05-06 DIAGNOSIS — F3160 Bipolar disorder, current episode mixed, unspecified: Secondary | ICD-10-CM

## 2015-05-06 MED ORDER — CARBAMAZEPINE 200 MG PO TABS: ORAL_TABLET | ORAL | Status: DC
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
## 2015-05-07 ENCOUNTER — Encounter (HOSPITAL_BASED_OUTPATIENT_CLINIC_OR_DEPARTMENT_OTHER): Payer: Commercial Managed Care - HMO

## 2015-05-07 ENCOUNTER — Encounter (HOSPITAL_COMMUNITY): Payer: Commercial Managed Care - HMO | Attending: Hematology and Oncology

## 2015-05-07 VITALS — BP 135/76 | HR 83 | Temp 98.0°F | Resp 16 | Wt 233.2 lb

## 2015-05-07 DIAGNOSIS — D839 Common variable immunodeficiency, unspecified: Secondary | ICD-10-CM | POA: Insufficient documentation

## 2015-05-07 DIAGNOSIS — R61 Generalized hyperhidrosis: Secondary | ICD-10-CM

## 2015-05-07 LAB — CBC WITH DIFFERENTIAL/PLATELET
Basophils Absolute: 0.1 10*3/uL (ref 0.0–0.1)
Basophils Relative: 1 % (ref 0–1)
EOS ABS: 0.3 10*3/uL (ref 0.0–0.7)
EOS PCT: 4 % (ref 0–5)
HEMATOCRIT: 36.7 % (ref 36.0–46.0)
Hemoglobin: 12.2 g/dL (ref 12.0–15.0)
LYMPHS ABS: 1.9 10*3/uL (ref 0.7–4.0)
Lymphocytes Relative: 30 % (ref 12–46)
MCH: 29.3 pg (ref 26.0–34.0)
MCHC: 33.2 g/dL (ref 30.0–36.0)
MCV: 88.2 fL (ref 78.0–100.0)
Monocytes Absolute: 0.3 10*3/uL (ref 0.1–1.0)
Monocytes Relative: 4 % (ref 3–12)
NEUTROS ABS: 4 10*3/uL (ref 1.7–7.7)
Neutrophils Relative %: 61 % (ref 43–77)
Platelets: 318 10*3/uL (ref 150–400)
RBC: 4.16 MIL/uL (ref 3.87–5.11)
RDW: 13.1 % (ref 11.5–15.5)
WBC: 6.5 10*3/uL (ref 4.0–10.5)

## 2015-05-07 LAB — SEDIMENTATION RATE: SED RATE: 2 mm/h (ref 0–22)

## 2015-05-07 LAB — C-REACTIVE PROTEIN: CRP: 0.7 mg/dL (ref <1.0)

## 2015-05-07 LAB — LACTATE DEHYDROGENASE: LDH: 134 U/L (ref 98–192)

## 2015-05-07 MED ORDER — IMMUNE GLOBULIN (HUMAN) 10 GM/100ML IV SOLN
0.4000 g/kg | Freq: Once | INTRAVENOUS | Status: AC
  Administered 2015-05-07: 40 g via INTRAVENOUS
  Filled 2015-05-07: qty 400

## 2015-05-07 MED ORDER — SODIUM CHLORIDE 0.9 % IV SOLN
Freq: Once | INTRAVENOUS | Status: AC
  Administered 2015-05-07: 09:00:00 via INTRAVENOUS

## 2015-05-07 MED ORDER — DIPHENHYDRAMINE HCL 25 MG PO CAPS
25.0000 mg | ORAL_CAPSULE | Freq: Once | ORAL | Status: AC
  Administered 2015-05-07: 25 mg via ORAL
  Filled 2015-05-07: qty 1

## 2015-05-07 MED ORDER — ACETAMINOPHEN 325 MG PO TABS
650.0000 mg | ORAL_TABLET | Freq: Four times a day (QID) | ORAL | Status: DC | PRN
  Administered 2015-05-07: 650 mg via ORAL
  Filled 2015-05-07: qty 2

## 2015-05-07 NOTE — Patient Instructions (Signed)
[place] at [facility] Discharge Instructions  RECOMMENDATIONS MADE BY THE CONSULTANT AND ANY TEST RESULTS WILL BE SENT TO YOUR REFERRING PHYSICIAN.  IVIG infusion given today as ordered. Continue infusion every month. Return as scheduled.  Thank you for choosing [place] at [facility] to provide your oncology and hematology care.  To afford each patient quality time with our provider, please arrive at least 15 minutes before your scheduled appointment time.    You need to re-schedule your appointment should you arrive 10 or more minutes late.  We strive to give you quality time with our providers, and arriving late affects you and other patients whose appointments are after yours.  Also, if you no show three or more times for appointments you may be dismissed from the clinic at the providers discretion.     Again, thank you for choosing [facility].  Our hope is that these requests will decrease the amount of time that you wait before being seen by our physicians.       _____________________________________________________________  Should you have questions after your visit to [facility], please contact our office at [phone] between the hours of 8:30 a.m. and 4:30 p.m.  Voicemails left after 4:30 p.m. will not be returned until the following business day.  For prescription refill requests, have your pharmacy contact our office.

## 2015-05-07 NOTE — Progress Notes (Signed)
LABS DRAWN

## 2015-05-07 NOTE — Progress Notes (Signed)
Tolerated infusion well. 

## 2015-05-08 ENCOUNTER — Other Ambulatory Visit (HOSPITAL_COMMUNITY): Payer: Self-pay

## 2015-05-08 ENCOUNTER — Ambulatory Visit (HOSPITAL_COMMUNITY): Payer: Self-pay

## 2015-05-08 LAB — BETA 2 MICROGLOBULIN, SERUM: Beta-2 Microglobulin: 1.3 mg/L (ref 0.6–2.4)

## 2015-05-08 LAB — IGG, IGA, IGM
IGG (IMMUNOGLOBIN G), SERUM: 834 mg/dL (ref 700–1600)
IGM, SERUM: 31 mg/dL (ref 26–217)
IgA: 62 mg/dL — ABNORMAL LOW (ref 87–352)

## 2015-05-14 ENCOUNTER — Encounter (HOSPITAL_COMMUNITY): Payer: Self-pay | Admitting: Oncology

## 2015-05-18 ENCOUNTER — Ambulatory Visit (HOSPITAL_COMMUNITY): Payer: Self-pay | Admitting: Hematology & Oncology

## 2015-05-18 ENCOUNTER — Other Ambulatory Visit (HOSPITAL_COMMUNITY): Payer: Self-pay

## 2015-05-25 ENCOUNTER — Other Ambulatory Visit (HOSPITAL_COMMUNITY): Payer: Self-pay

## 2015-05-27 ENCOUNTER — Ambulatory Visit (HOSPITAL_COMMUNITY): Payer: Self-pay | Admitting: Hematology & Oncology

## 2015-06-03 ENCOUNTER — Ambulatory Visit (INDEPENDENT_AMBULATORY_CARE_PROVIDER_SITE_OTHER): Payer: Commercial Managed Care - HMO | Admitting: Psychiatry

## 2015-06-03 ENCOUNTER — Encounter (HOSPITAL_COMMUNITY): Payer: Self-pay | Admitting: Psychiatry

## 2015-06-03 VITALS — BP 155/83 | HR 81 | Ht 71.0 in | Wt 240.6 lb

## 2015-06-03 DIAGNOSIS — F332 Major depressive disorder, recurrent severe without psychotic features: Secondary | ICD-10-CM

## 2015-06-03 DIAGNOSIS — F3160 Bipolar disorder, current episode mixed, unspecified: Secondary | ICD-10-CM

## 2015-06-03 MED ORDER — DULOXETINE HCL 30 MG PO CPEP: 30.0000 mg | ORAL_CAPSULE | Freq: Two times a day (BID) | ORAL | Status: DC
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
## 2015-06-05 ENCOUNTER — Encounter (HOSPITAL_COMMUNITY): Payer: Commercial Managed Care - HMO

## 2015-06-05 ENCOUNTER — Encounter (HOSPITAL_COMMUNITY): Payer: Self-pay | Admitting: Hematology & Oncology

## 2015-06-05 ENCOUNTER — Encounter (HOSPITAL_COMMUNITY): Payer: Commercial Managed Care - HMO | Attending: Hematology and Oncology

## 2015-06-05 ENCOUNTER — Encounter (HOSPITAL_BASED_OUTPATIENT_CLINIC_OR_DEPARTMENT_OTHER): Payer: Commercial Managed Care - HMO | Admitting: Hematology & Oncology

## 2015-06-05 VITALS — BP 138/78 | HR 77 | Temp 98.1°F | Resp 18

## 2015-06-05 VITALS — BP 151/79 | HR 94 | Temp 98.0°F | Resp 18 | Wt 237.2 lb

## 2015-06-05 DIAGNOSIS — G609 Hereditary and idiopathic neuropathy, unspecified: Secondary | ICD-10-CM | POA: Diagnosis not present

## 2015-06-05 DIAGNOSIS — K5900 Constipation, unspecified: Secondary | ICD-10-CM

## 2015-06-05 DIAGNOSIS — D839 Common variable immunodeficiency, unspecified: Secondary | ICD-10-CM

## 2015-06-05 LAB — CBC WITH DIFFERENTIAL/PLATELET
BASOS ABS: 0.1 10*3/uL (ref 0.0–0.1)
Basophils Relative: 1 % (ref 0–1)
EOS ABS: 0.3 10*3/uL (ref 0.0–0.7)
Eosinophils Relative: 4 % (ref 0–5)
HCT: 37.9 % (ref 36.0–46.0)
HEMOGLOBIN: 12.9 g/dL (ref 12.0–15.0)
Lymphocytes Relative: 23 % (ref 12–46)
Lymphs Abs: 2 10*3/uL (ref 0.7–4.0)
MCH: 29.9 pg (ref 26.0–34.0)
MCHC: 34 g/dL (ref 30.0–36.0)
MCV: 87.9 fL (ref 78.0–100.0)
Monocytes Absolute: 0.5 10*3/uL (ref 0.1–1.0)
Monocytes Relative: 5 % (ref 3–12)
Neutro Abs: 5.9 10*3/uL (ref 1.7–7.7)
Neutrophils Relative %: 67 % (ref 43–77)
Platelets: 328 10*3/uL (ref 150–400)
RBC: 4.31 MIL/uL (ref 3.87–5.11)
RDW: 13.7 % (ref 11.5–15.5)
WBC: 8.7 10*3/uL (ref 4.0–10.5)

## 2015-06-05 LAB — COMPREHENSIVE METABOLIC PANEL
ALBUMIN: 4 g/dL (ref 3.5–5.0)
ALK PHOS: 59 U/L (ref 38–126)
ALT: 29 U/L (ref 14–54)
ANION GAP: 11 (ref 5–15)
AST: 30 U/L (ref 15–41)
BUN: 9 mg/dL (ref 6–20)
CALCIUM: 9.2 mg/dL (ref 8.9–10.3)
CHLORIDE: 100 mmol/L — AB (ref 101–111)
CO2: 25 mmol/L (ref 22–32)
Creatinine, Ser: 0.89 mg/dL (ref 0.44–1.00)
Glucose, Bld: 239 mg/dL — ABNORMAL HIGH (ref 65–99)
Potassium: 3.7 mmol/L (ref 3.5–5.1)
Sodium: 136 mmol/L (ref 135–145)
TOTAL PROTEIN: 6.7 g/dL (ref 6.5–8.1)
Total Bilirubin: 0.6 mg/dL (ref 0.3–1.2)

## 2015-06-05 MED ORDER — DIPHENHYDRAMINE HCL 25 MG PO CAPS: 25.0000 mg | ORAL_CAPSULE | Freq: Once | ORAL | Status: DC

## 2015-06-05 MED ORDER — POLYETHYLENE GLYCOL 3350 17 GM/SCOOP PO POWD: ORAL | Status: DC

## 2015-06-05 MED ORDER — IMMUNE GLOBULIN (HUMAN) 20 GM/200ML IV SOLN
40.0000 g | Freq: Once | INTRAVENOUS | Status: AC
  Administered 2015-06-05: 40 g via INTRAVENOUS
  Filled 2015-06-05: qty 400

## 2015-06-05 MED ORDER — SODIUM CHLORIDE 0.9 % IV SOLN
Freq: Once | INTRAVENOUS | Status: AC
  Administered 2015-06-05: 09:00:00 via INTRAVENOUS

## 2015-06-05 MED ORDER — ACETAMINOPHEN 325 MG PO TABS: 650.0000 mg | ORAL_TABLET | Freq: Four times a day (QID) | ORAL | Status: DC | PRN

## 2015-06-05 NOTE — Progress Notes (Signed)
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 female returns for follow-up of her common variable immunodeficiency.  She did great with her IVIG. She is unable to afford the co-pay for IVIG which she states is $800 a month. He is very disappointed in this, she states she intelligent improvement in how she feels. She discontinued her IVIG in January but because of recurrent symptoms restarted in May. She is doing well.  She is feeling well today and is with her husband. She has had no sinus infection, fever, or lung issues. She has been eating well and sleeping all right. She has another sleep study scheduled for the 7th of July. She has had dark stools, expressing they aren't tarry but they are black. She figures it is due to her constipation. She takes Miralax to help.  She recently went to GI for an upper endoscopy. She is followed in [place]. She is up to date on her mammograms.   MEDICAL HISTORY: Past Medical History  Diagnosis Date  . Bipolar disorder   . Hip pain, left   . Essential hypertension, benign   . Neck pain   . Allergic rhinitis   . Fibromyalgia   . Constipation   . IBS (irritable bowel syndrome)   . Hyperlipidemia   . GERD (gastroesophageal reflux disease)   . Depression   . Asthma   . PTSD (post-traumatic stress disorder)   . Compulsive behavior disorder   . Type 2 diabetes mellitus   . Sleep apnea     Wears CPAP at bedtime  . History of pneumonia 1988  . Poor short term memory   . Urgency of urination   . Arthritis     Lower back and hips  . Anemia   . History of palpitations     Negative Holter monitor  . S/P Botox injection 11/2014     For migraine headaches  . CVID (common variable immunodeficiency)     has Unspecified vitamin D deficiency; Mixed hyperlipidemia; Morbid  obesity; BIPOLAR AFFECTIVE DISORDER; Hx of tobacco use, presenting hazards to health; DEPRESSION; Essential hypertension, benign; IBS; FIBROMYALGIA; Palpitations; Neuropathy of upper extremity; History of stroke in prior 3 months; Headache(784.0); Cervicalgia; Acute confusional state; Common variable immunodeficiency; Migraine; and Neck pain of over 3 months duration on her problem list.     No history exists.     is allergic to sulfonamide derivatives; gluten meal; molds & smuts; clonazepam; dust mite extract; and tegretol.  Ms. Cowles does not currently have medications on file.  SURGICAL HISTORY: Past Surgical History  Procedure Laterality Date  . Cholecystectomy    . Tubal ligation    . Muscle biopsy  2008    Right leg  . Carpal tunnel release Right 06/10/2013    Procedure: CARPAL TUNNEL RELEASE;  Surgeon: Faythe Ghee, MD;  Location: [facility];  Service: Neurosurgery;  Laterality: Right;  Right Carpal Tunnel Release   . Mole exc      x 3  . Dental surgery  12/2014    SOCIAL HISTORY: History   Social History  . Marital Status: Married    Spouse Name: N/A  . Number of Children: 2  . Years of Education: College   Occupational History  . Umemployed     Now disabled due to bipolar and fibromyalgia  Social History Main Topics  . Smoking status: Former Smoker    Types: Cigarettes    Quit date: 11/04/2010  . Smokeless tobacco: Never Used  . Alcohol Use: No  . Drug Use: No  . Sexual Activity: Yes   Other Topics Concern  . Not on file   Social History Narrative   Married   Lives with spouse and daughter   Right handed.   Caffeine use: 2 cups coffee in morning and 2 cups tea during the day     FAMILY HISTORY: Family History  Problem Relation Age of Onset  . Fibromyalgia Mother   . Diabetes type II Mother   . Depression Mother   . Alzheimer's disease Mother   . GER disease Mother   . Heart disease Mother   . Paranoid behavior Mother   . Dementia Mother     . Heart attack Father 31    MI x5  . Stroke Father     CVA x7  . Asthma Father   . Brain cancer Father   . Seizures Father   . Anxiety disorder Sister   . OCD Sister   . Sexual abuse Sister   . Physical abuse Sister   . Anxiety disorder Sister   . Alcohol abuse Neg Hx   . Drug abuse Neg Hx   . Schizophrenia Neg Hx   . ADD / ADHD Daughter   . ADD / ADHD Daughter     Review of Systems  Constitutional: Negative. HENT: Negative.   Eyes: Negative.   Respiratory: Negative.   Cardiovascular: Negative.   Gastrointestinal: Positive for constipation and dark stool.   Genitourinary: Negative.   Musculoskeletal: Positive for joint pain and body pain.  Skin: Negative.   Neurological: Negative.   Endo/Heme/Allergies: Negative.   Psychiatric/Behavioral: Negative.        Mood today is reported as good    PHYSICAL EXAMINATION  ECOG PERFORMANCE STATUS: 0 - Asymptomatic  Filed Vitals:   06/05/15 0848  BP: 151/79  Pulse: 94  Temp: 98 F (36.7 C)  Resp: 18    Physical Exam  Constitutional: She is oriented to person, place, and time and well-developed, well-nourished, and in no distress.  HENT:  Head: Normocephalic and atraumatic.  Nose: Nose normal.  Mouth/Throat: Oropharynx is clear and moist. No oropharyngeal exudate.  Eyes: Conjunctivae and EOM are normal. Pupils are equal, round, and reactive to light. Right eye exhibits no discharge. Left eye exhibits no discharge. No scleral icterus.  Neck: Normal range of motion. Neck supple. No tracheal deviation present. No thyromegaly present.  Cardiovascular: Normal rate, regular rhythm and normal heart sounds.  Exam reveals no gallop and no friction rub.   No murmur heard. Pulmonary/Chest: Effort normal and breath sounds normal. She has no wheezes. She has no rales.  Abdominal: Soft. Bowel sounds are normal. She exhibits no distension and no mass. There is no tenderness. There is no rebound and no guarding.  Musculoskeletal:  Normal range of motion. She exhibits no edema.  Lymphadenopathy:    She has no cervical adenopathy.  Neurological: She is alert and oriented to person, place, and time. She has normal reflexes. No cranial nerve deficit. Gait normal. Coordination normal.  Skin: Skin is warm and dry. No rash noted.  Psychiatric: Mood, memory, affect and judgment normal.  Nursing note and vitals reviewed.   LABORATORY DATA:  CBC    Component Value Date/Time   WBC 8.7 06/05/2015 0908   RBC 4.31 06/05/2015 0908  RBC 4.24 08/27/2014 0842   HGB 12.9 06/05/2015 0908   HCT 37.9 06/05/2015 0908   PLT 328 06/05/2015 0908   MCV 87.9 06/05/2015 0908   MCH 29.9 06/05/2015 0908   MCHC 34.0 06/05/2015 0908   RDW 13.7 06/05/2015 0908   LYMPHSABS 2.0 06/05/2015 0908   MONOABS 0.5 06/05/2015 0908   EOSABS 0.3 06/05/2015 0908   BASOSABS 0.1 06/05/2015 0908   CMP     Component Value Date/Time   NA 136 06/05/2015 0908   K 3.7 06/05/2015 0908   CL 100* 06/05/2015 0908   CO2 25 06/05/2015 0908   GLUCOSE 239* 06/05/2015 0908   BUN 9 06/05/2015 0908   CREATININE 0.89 06/05/2015 0908   CREATININE 0.88 02/21/2012 1153   CALCIUM 9.2 06/05/2015 0908   PROT 6.7 06/05/2015 0908   ALBUMIN 4.0 06/05/2015 0908   AST 30 06/05/2015 0908   ALT 29 06/05/2015 0908   ALKPHOS 59 06/05/2015 0908   BILITOT 0.6 06/05/2015 0908   GFRNONAA >60 06/05/2015 0908   GFRAA >60 06/05/2015 0908     ASSESSMENT and THERAPY PLAN:  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 Her symptoms are well controlled on monthly therapy.  She is up-to-date in regards to her well care. Her blood counts are normal and I reviewed her labs with her today. In regards to her constipation, she was encouraged to use miralax daily and/or follow-up with her GI physician in [place].  She will continue with monthly IVIG.  We will see her  back in 2 months with repeat physical exam.  Orders Placed This Encounter  Procedures  . CBC with Differential    Standing Status: Standing     Number of Occurrences: 24     Standing Expiration Date: 06/04/2017  . Comprehensive metabolic panel    Standing Status: Standing     Number of Occurrences: 24     Standing Expiration Date: 06/04/2017  . IgG, IgA, IgM    Standing Status: Standing     Number of Occurrences: 24     Standing Expiration Date: 06/04/2017    All questions were answered. The patient knows to call the clinic with any problems, questions or concerns. We can certainly see the patient much sooner if necessary.  This document serves as a record of services personally performed by Ancil Linsey, MD. It was created on her behalf by Arlyce Harman, a trained medical scribe. The creation of this record is based on the scribe's personal observations and the provider's statements to them. This document has been checked and approved by the attending provider.  I have reviewed the above documentation for accuracy and completeness, and I agree with the above.  Molli Hazard 06/05/2015

## 2015-06-05 NOTE — Patient Instructions (Addendum)
..  [place] at [facility] Discharge Instructions  RECOMMENDATIONS MADE BY THE CONSULTANT AND ANY TEST RESULTS WILL BE SENT TO YOUR REFERRING PHYSICIAN.  Exam per Dr. Whitney Muse  Advised to try a half cap of Miralax daily to become more regular  Return to see Gershon Mussel our PA in 2 months  Thank you for choosing [place] at [facility] to provide your oncology and hematology care.  To afford each patient quality time with our provider, please arrive at least 15 minutes before your scheduled appointment time.    You need to re-schedule your appointment should you arrive 10 or more minutes late.  We strive to give you quality time with our providers, and arriving late affects you and other patients whose appointments are after yours.  Also, if you no show three or more times for appointments you may be dismissed from the clinic at the providers discretion.     Again, thank you for choosing [facility].  Our hope is that these requests will decrease the amount of time that you wait before being seen by our physicians.       _____________________________________________________________  Should you have questions after your visit to [facility], please contact our office at [phone] between the hours of 8:30 a.m. and 4:30 p.m.  Voicemails left after 4:30 p.m. will not be returned until the following business day.  For prescription refill requests, have your pharmacy contact our office.

## 2015-06-05 NOTE — Patient Instructions (Signed)
[place] at [facility] Discharge Instructions  RECOMMENDATIONS MADE BY THE CONSULTANT AND ANY TEST RESULTS WILL BE SENT TO YOUR REFERRING PHYSICIAN.  Exam and discussion today with Dr. Whitney Muse. IVIG infusion today and every 28 days. Office visit in 2 months.  Please call the clinic prior to your next visit should you have questions or concerns.  Thank you for choosing [place] at [facility] to provide your oncology and hematology care.  To afford each patient quality time with our provider, please arrive at least 15 minutes before your scheduled appointment time.    You need to re-schedule your appointment should you arrive 10 or more minutes late.  We strive to give you quality time with our providers, and arriving late affects you and other patients whose appointments are after yours.  Also, if you no show three or more times for appointments you may be dismissed from the clinic at the providers discretion.     Again, thank you for choosing [facility].  Our hope is that these requests will decrease the amount of time that you wait before being seen by our physicians.       _____________________________________________________________  Should you have questions after your visit to [facility], please contact our office at [phone] between the hours of 8:30 a.m. and 4:30 p.m.  Voicemails left after 4:30 p.m. will not be returned until the following business day.  For prescription refill requests, have your pharmacy contact our office.

## 2015-06-05 NOTE — Progress Notes (Signed)
Labs drawn dbiv

## 2015-06-05 NOTE — Progress Notes (Signed)
1145:  Tolerated infusion w/o adverse reaction; a&ox4; VSS.  Discharged ambulatory in c/o spouse.

## 2015-06-06 LAB — IGG, IGA, IGM
IGA: 46 mg/dL — AB (ref 87–352)
IgG (Immunoglobin G), Serum: 839 mg/dL (ref 700–1600)
IgM, Serum: 22 mg/dL — ABNORMAL LOW (ref 26–217)

## 2015-06-11 ENCOUNTER — Ambulatory Visit (INDEPENDENT_AMBULATORY_CARE_PROVIDER_SITE_OTHER): Payer: Commercial Managed Care - HMO | Admitting: Neurology

## 2015-06-11 VITALS — BP 144/93 | HR 82

## 2015-06-11 DIAGNOSIS — G4733 Obstructive sleep apnea (adult) (pediatric): Secondary | ICD-10-CM | POA: Diagnosis not present

## 2015-06-11 DIAGNOSIS — G4720 Circadian rhythm sleep disorder, unspecified type: Secondary | ICD-10-CM

## 2015-06-11 DIAGNOSIS — G479 Sleep disorder, unspecified: Secondary | ICD-10-CM

## 2015-06-11 NOTE — Sleep Study (Signed)
Please see the scanned sleep study interpretation located in the Procedure tab within the Chart Review section. 

## 2015-06-12 ENCOUNTER — Encounter (HOSPITAL_COMMUNITY): Payer: Self-pay | Admitting: Hematology & Oncology

## 2015-06-16 ENCOUNTER — Encounter (HOSPITAL_COMMUNITY): Payer: Self-pay | Admitting: *Deleted

## 2015-06-23 ENCOUNTER — Telehealth: Payer: Self-pay | Admitting: Neurology

## 2015-06-23 NOTE — Telephone Encounter (Signed)
Left message to call back for sleep study results.  

## 2015-06-23 NOTE — Telephone Encounter (Signed)
Please call and notify the patient that the recent sleep study did not show any significant obstructive sleep apnea. She has no significant desaturations and stayed above or at 90% during the entire study. 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 For this, further weight loss is likely going to help. She had reported significant weight loss last year, which may have reversed her prior diagnosis of OSA.  She did not sleep very well and it took her over 1 1/2 h to fall asleep.  I would like to remind her to pursue good sleep hygiene, which means: Keep a regular sleep and wake schedule, try not to exercise or have a meal within 2 hours of your bedtime, try to keep your bedroom conducive for sleep, that is, cool and dark, without light distractors such as an illuminated alarm clock, and refrain from watching TV right before sleep or in the middle of the night and do not keep the TV or radio on during the night. Also, try not to use or play on electronic devices at bedtime, such as your cell phone, tablet PC or laptop. If you like to read at bedtime on an electronic device, try to dim the background light as much as possible. Do not eat in the middle of the night.   From my end of things, no FU necessary. She can FU with Dr. Jaynee Eagles as planned.   Once you have spoken to patient, you can close this encounter.   Thanks,  [name], MD, PhD [facility])

## 2015-06-23 NOTE — Telephone Encounter (Signed)
I spoke to patient and she is aware of results. Michelle Clements states that she does not sleep well without her CPAP machine and would like an order for a new mask. What would you like me to tell her?

## 2015-06-23 NOTE — Telephone Encounter (Signed)
If ok with Dr. Jaynee Eagles, patient can request CPAP supplies through her. Will route to Dr. Jaynee Eagles too.

## 2015-06-24 NOTE — Telephone Encounter (Signed)
Michelle Clements - why does she need cpap supplies if she does not have sleep apnea?

## 2015-06-24 NOTE — Telephone Encounter (Signed)
She has prior diagnosis of OSA, but has lost weight since then. I explained results to patient. She states that she cannot sleep without her CPAP machine.

## 2015-06-24 NOTE — Telephone Encounter (Signed)
Beverlee Nims, I am not sure she should be using a cpap if she does not have OSA. I'm sure there are side effects to that. Dr. Rexene Alberts, are there any harmful side effects to using a cpap when you do NOT have sleep apnea?

## 2015-06-24 NOTE — Telephone Encounter (Signed)
Ok with you?

## 2015-06-25 NOTE — Telephone Encounter (Signed)
Study did not indicate OSA or significant desaturations. From my end: continue to strive for weight loss and good sleep hygiene.

## 2015-06-25 NOTE — Telephone Encounter (Signed)
Dr. Rexene Alberts states that she will not sign orders for CPAP supplies due to patient not having diagnosis of sleep apnea. Do you feel comfortable signing orders? I will advise patient on what you and Dr. Rexene Alberts advise.

## 2015-06-25 NOTE — Telephone Encounter (Signed)
Don't know of any major side effects. She will have difficulty getting supplies through ins, as they will look at sleep study results; if there no longer is treatment-worthy OSA, machine and supplies may not be covered by insurance. She can then purchase supplies and machine on her own, but again, study

## 2015-06-26 NOTE — Telephone Encounter (Signed)
I am not comfortable either. If she does not have OSA, she should not be using the cpap. thanks

## 2015-06-29 ENCOUNTER — Encounter: Payer: Self-pay | Admitting: Neurology

## 2015-06-29 NOTE — Telephone Encounter (Signed)
Dr. Rexene Alberts and Dr. Jaynee Eagles,  I spoke to patient and she is aware of results and recommendation below. She advised that she has to sleep with CPAP because it helps her headaches. She is aware that insurance will not pay for supplies without diagnosis. [place] demands due to cost of tests that she has completed that Dr. Rexene Alberts or Dr. Jaynee Eagles call her to discuss this. I stated that I would let you know. Let me know if I need to do anything for you.

## 2015-06-29 NOTE — Telephone Encounter (Signed)
Hr cpap has made a huge difference in her headaches. She is aware that the sleep test shows that she does not need a cpap. She is aware that there can be respiratory and lung damage with using the cpap without OSA. She disagrees, she feels more refreshed and her headaches have significantly improved.   Terrence Dupont, can you follow up on the referral to the headache clinic from last appointment. She never heard back.

## 2015-07-06 ENCOUNTER — Other Ambulatory Visit (HOSPITAL_COMMUNITY): Payer: Self-pay | Admitting: Oncology

## 2015-07-06 ENCOUNTER — Other Ambulatory Visit: Payer: Self-pay | Admitting: *Deleted

## 2015-07-06 ENCOUNTER — Encounter (HOSPITAL_COMMUNITY): Payer: Commercial Managed Care - HMO | Attending: Hematology and Oncology

## 2015-07-06 VITALS — BP 129/67 | HR 82 | Temp 98.1°F | Resp 18 | Wt 240.4 lb

## 2015-07-06 DIAGNOSIS — D839 Common variable immunodeficiency, unspecified: Secondary | ICD-10-CM | POA: Diagnosis present

## 2015-07-06 DIAGNOSIS — G43011 Migraine without aura, intractable, with status migrainosus: Secondary | ICD-10-CM

## 2015-07-06 LAB — COMPREHENSIVE METABOLIC PANEL
ALT: 26 U/L (ref 14–54)
AST: 28 U/L (ref 15–41)
Albumin: 3.9 g/dL (ref 3.5–5.0)
Alkaline Phosphatase: 48 U/L (ref 38–126)
Anion gap: 7 (ref 5–15)
BUN: 9 mg/dL (ref 6–20)
CALCIUM: 9.4 mg/dL (ref 8.9–10.3)
CO2: 26 mmol/L (ref 22–32)
CREATININE: 0.83 mg/dL (ref 0.44–1.00)
Chloride: 103 mmol/L (ref 101–111)
GFR calc non Af Amer: >60 mL/min (ref >60)
Glucose, Bld: 223 mg/dL — ABNORMAL HIGH (ref 65–99)
POTASSIUM: 3.9 mmol/L (ref 3.5–5.1)
SODIUM: 136 mmol/L (ref 135–145)
Total Bilirubin: 0.5 mg/dL (ref 0.3–1.2)
Total Protein: 6.6 g/dL (ref 6.5–8.1)

## 2015-07-06 LAB — CBC WITH DIFFERENTIAL/PLATELET
Basophils Absolute: 0.1 10*3/uL (ref 0.0–0.1)
Basophils Relative: 1 % (ref 0–1)
EOS ABS: 0.3 10*3/uL (ref 0.0–0.7)
Eosinophils Relative: 4 % (ref 0–5)
HEMATOCRIT: 37 % (ref 36.0–46.0)
Hemoglobin: 12.6 g/dL (ref 12.0–15.0)
Lymphocytes Relative: 32 % (ref 12–46)
Lymphs Abs: 2.5 10*3/uL (ref 0.7–4.0)
MCH: 29.5 pg (ref 26.0–34.0)
MCHC: 34.1 g/dL (ref 30.0–36.0)
MCV: 86.7 fL (ref 78.0–100.0)
MONO ABS: 0.4 10*3/uL (ref 0.1–1.0)
MONOS PCT: 5 % (ref 3–12)
NEUTROS ABS: 4.5 10*3/uL (ref 1.7–7.7)
Neutrophils Relative %: 58 % (ref 43–77)
Platelets: 315 10*3/uL (ref 150–400)
RBC: 4.27 MIL/uL (ref 3.87–5.11)
RDW: 13.6 % (ref 11.5–15.5)
WBC: 7.8 10*3/uL (ref 4.0–10.5)

## 2015-07-06 LAB — URINALYSIS, ROUTINE W REFLEX MICROSCOPIC
BILIRUBIN URINE: NEGATIVE
GLUCOSE, UA: NEGATIVE mg/dL
HGB URINE DIPSTICK: NEGATIVE
KETONES UR: NEGATIVE mg/dL
Leukocytes, UA: NEGATIVE
NITRITE: NEGATIVE
PH: 6 (ref 5.0–8.0)
PROTEIN: NEGATIVE mg/dL
SPECIFIC GRAVITY, URINE: 1.01 (ref 1.005–1.030)
UROBILINOGEN UA: 0.2 mg/dL (ref 0.0–1.0)

## 2015-07-06 MED ORDER — DIPHENHYDRAMINE HCL 25 MG PO CAPS: 25.0000 mg | ORAL_CAPSULE | Freq: Once | ORAL | Status: DC

## 2015-07-06 MED ORDER — ACETAMINOPHEN 325 MG PO TABS
650.0000 mg | ORAL_TABLET | Freq: Four times a day (QID) | ORAL | Status: DC | PRN
Start: 2015-07-06 — End: 2015-07-06

## 2015-07-06 MED ORDER — IMMUNE GLOBULIN (HUMAN) 20 GM/200ML IV SOLN
40.0000 g | Freq: Once | INTRAVENOUS | Status: DC
  Filled 2015-07-06: qty 400

## 2015-07-06 MED ORDER — SODIUM CHLORIDE 0.9 % IV SOLN
Freq: Once | INTRAVENOUS | Status: AC
  Administered 2015-07-06: 09:00:00 via INTRAVENOUS

## 2015-07-06 MED ORDER — IMMUNE GLOBULIN (HUMAN) 5 GM/50ML IV SOLN
40.0000 g | Freq: Once | INTRAVENOUS | Status: AC
  Administered 2015-07-06: 40 g via INTRAVENOUS
  Filled 2015-07-06: qty 400

## 2015-07-06 NOTE — Progress Notes (Signed)
Patient tolerated infusion well.  VSS at all VS checks.    PA made aware of patient questions about alternative therapy as well as a UA was obtained r/t the patient having c/o urinary symptoms.

## 2015-07-08 ENCOUNTER — Telehealth: Payer: Self-pay | Admitting: Neurology

## 2015-07-08 NOTE — Telephone Encounter (Signed)
Called patient to schedule the sleep order that is in the system.  The patient has had a sleep study already.  Can I remove the duplicate split sleep study?

## 2015-07-09 NOTE — Telephone Encounter (Signed)
Yes, thank you.

## 2015-07-10 ENCOUNTER — Encounter (HOSPITAL_COMMUNITY): Payer: Self-pay | Admitting: Hematology & Oncology

## 2015-07-21 ENCOUNTER — Encounter (HOSPITAL_COMMUNITY): Payer: Self-pay | Admitting: Hematology & Oncology

## 2015-07-30 ENCOUNTER — Encounter (HOSPITAL_BASED_OUTPATIENT_CLINIC_OR_DEPARTMENT_OTHER): Payer: Commercial Managed Care - HMO | Admitting: Hematology & Oncology

## 2015-07-30 VITALS — BP 140/70 | HR 79 | Temp 98.9°F | Resp 18 | Wt 238.9 lb

## 2015-07-30 DIAGNOSIS — D839 Common variable immunodeficiency, unspecified: Secondary | ICD-10-CM | POA: Diagnosis not present

## 2015-07-30 NOTE — Patient Instructions (Signed)
..  [place] at [facility] Discharge Instructions  RECOMMENDATIONS MADE BY THE CONSULTANT AND ANY TEST RESULTS WILL BE SENT TO YOUR REFERRING PHYSICIAN.  No additional IVIG Refer to Dr. Derenda Mis in [place] Return in 3 months  Thank you for choosing [place] at [facility] to provide your oncology and hematology care.  To afford each patient quality time with our provider, please arrive at least 15 minutes before your scheduled appointment time.    You need to re-schedule your appointment should you arrive 10 or more minutes late.  We strive to give you quality time with our providers, and arriving late affects you and other patients whose appointments are after yours.  Also, if you no show three or more times for appointments you may be dismissed from the clinic at the providers discretion.     Again, thank you for choosing [facility].  Our hope is that these requests will decrease the amount of time that you wait before being seen by our physicians.       _____________________________________________________________  Should you have questions after your visit to [facility], please contact our office at [phone] between the hours of 8:30 a.m. and 4:30 p.m.  Voicemails left after 4:30 p.m. will not be returned until the following business day.  For prescription refill requests, have your pharmacy contact our office.

## 2015-07-30 NOTE — Progress Notes (Signed)
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 female returns for follow-up of her common variable immunodeficiency.    The patient is concerned with the expense of the IVIG.  She states that if necessary due to financial reasons, she will have to stop the infusions.  She is still looking into other sources for support such as Medicaid, [employer], and the [employer]. She notes however, that assistance for IVIG is limited.  She has been experiences painful bladder spasms.  She also complains of bad cramping that she has never had before and changes to her menstrual cycle.  She is pre-menopausal.  Her Gynecologist is Dr. Mauri Brooklyn, [facility] in [place].    She currently denies fever, chills, nausea or vomiting. Appetite is stable.  MEDICAL HISTORY: Past Medical History  Diagnosis Date  . Bipolar disorder   . Hip pain, left   . Essential hypertension, benign   . Neck pain   . Allergic rhinitis   . Fibromyalgia   . Constipation   . IBS (irritable bowel syndrome)   . Hyperlipidemia   . GERD (gastroesophageal reflux disease)   . Depression   . Asthma   . PTSD (post-traumatic stress disorder)   . Compulsive behavior disorder   . Type 2 diabetes mellitus   . History of pneumonia 1988  . Poor short term memory   . Urgency of urination   . Arthritis     Lower back and hips  . Anemia   . History of palpitations     Negative Holter monitor  . S/P Botox injection 11/2014     For migraine headaches  . CVID (common variable immunodeficiency)   . H/O sleep apnea     has Unspecified vitamin D deficiency; Mixed hyperlipidemia; Morbid obesity; BIPOLAR AFFECTIVE DISORDER; Hx of tobacco use, presenting hazards to health; DEPRESSION; Essential hypertension, benign; IBS; FIBROMYALGIA;  Palpitations; Neuropathy of upper extremity; History of stroke in prior 3 months; Headache(784.0); Cervicalgia; Acute confusional state; Common variable immunodeficiency; Migraine; and Neck pain of over 3 months duration on her problem list.     No history exists.     is allergic to sulfonamide derivatives; gluten meal; molds & smuts; clonazepam; dust mite extract; and tegretol.  Ms. Hritz does not currently have medications on file.  SURGICAL HISTORY: Past Surgical History  Procedure Laterality Date  . Cholecystectomy    . Tubal ligation    . Muscle biopsy  2008    Right leg  . Carpal tunnel release Right 06/10/2013    Procedure: CARPAL TUNNEL RELEASE;  Surgeon: Faythe Ghee, MD;  Location: [facility];  Service: Neurosurgery;  Laterality: Right;  Right Carpal Tunnel Release   . Mole exc      x 3  . Dental surgery  12/2014    SOCIAL HISTORY: Social History   Social History  . Marital Status: Married    Spouse Name: N/A  . Number of Children: 2  . Years of Education: College   Occupational History  . Umemployed     Now disabled due to bipolar and fibromyalgia   Social History Main Topics  . Smoking status: Former Smoker    Types: Cigarettes    Quit date: 11/04/2010  . Smokeless tobacco: Never Used  . Alcohol Use: No  . Drug Use: No  . Sexual  Activity: Yes   Other Topics Concern  . Not on file   Social History Narrative   Married   Lives with spouse and daughter   Right handed.   Caffeine use: 2 cups coffee in morning and 2 cups tea during the day     FAMILY HISTORY: Family History  Problem Relation Age of Onset  . Fibromyalgia Mother   . Diabetes type II Mother   . Depression Mother   . Alzheimer's disease Mother   . GER disease Mother   . Heart disease Mother   . Paranoid behavior Mother   . Dementia Mother   . Heart attack Father 31    MI x5  . Stroke Father     CVA x7  . Asthma Father   . Brain cancer Father   . Seizures Father   . Anxiety  disorder Sister   . OCD Sister   . Sexual abuse Sister   . Physical abuse Sister   . Anxiety disorder Sister   . Alcohol abuse Neg Hx   . Drug abuse Neg Hx   . Schizophrenia Neg Hx   . ADD / ADHD Daughter   . ADD / ADHD Daughter     Review of Systems  Constitutional: Negative. HENT: Negative.   Eyes: Negative.   Respiratory: Negative.   Cardiovascular: Negative.   Gastrointestinal: Negative  Genitourinary: Positive for bladder spasms and menstrual cycle changes   Musculoskeletal: Negative. Skin: Negative.   Neurological: Negative.   Endo/Heme/Allergies: Negative.   Psychiatric/Behavioral: Negative.        Mood today is reported as good  14 point review of systems was performed and is negative except as detailed under history of present illness and above   PHYSICAL EXAMINATION  ECOG PERFORMANCE STATUS: 0 - Asymptomatic  Filed Vitals:   07/30/15 0915  BP: 140/70  Pulse: 79  Temp: 98.9 F (37.2 C)  Resp: 18    Physical Exam  Constitutional: She is oriented to person, place, and time and well-developed, well-nourished, and in no distress.  HENT:  Head: Normocephalic and atraumatic.  Nose: Nose normal.  Mouth/Throat: Oropharynx is clear and moist. No oropharyngeal exudate.  Eyes: Conjunctivae and EOM are normal. Pupils are equal, round, and reactive to light. Right eye exhibits no discharge. Left eye exhibits no discharge. No scleral icterus.  Neck: Normal range of motion. Neck supple. No tracheal deviation present. No thyromegaly present.  Cardiovascular: Normal rate, regular rhythm and normal heart sounds.  Exam reveals no gallop and no friction rub.   No murmur heard. Pulmonary/Chest: Effort normal and breath sounds normal. She has no wheezes. She has no rales.  Abdominal: Soft. Bowel sounds are normal. She exhibits no distension and no mass. There is no tenderness. There is no rebound and no guarding.  Musculoskeletal: Normal range of motion. She exhibits no  edema.  Lymphadenopathy:    She has no cervical adenopathy.  Neurological: She is alert and oriented to person, place, and time. She has normal reflexes. No cranial nerve deficit. Gait normal. Coordination normal.  Skin: Skin is warm and dry. No rash noted.  Psychiatric: Mood, memory, affect and judgment normal.  Nursing note and vitals reviewed.   LABORATORY DATA:  CBC    Component Value Date/Time   WBC 7.8 07/06/2015 0939   RBC 4.27 07/06/2015 0939   RBC 4.24 08/27/2014 0842   HGB 12.6 07/06/2015 0939   HCT 37.0 07/06/2015 0939   PLT 315 07/06/2015 0939   MCV  86.7 07/06/2015 0939   MCH 29.5 07/06/2015 0939   MCHC 34.1 07/06/2015 0939   RDW 13.6 07/06/2015 0939   LYMPHSABS 2.5 07/06/2015 0939   MONOABS 0.4 07/06/2015 0939   EOSABS 0.3 07/06/2015 0939   BASOSABS 0.1 07/06/2015 0939   CMP     Component Value Date/Time   NA 136 07/06/2015 0939   K 3.9 07/06/2015 0939   CL 103 07/06/2015 0939   CO2 26 07/06/2015 0939   GLUCOSE 223* 07/06/2015 0939   BUN 9 07/06/2015 0939   CREATININE 0.83 07/06/2015 0939   CREATININE 0.88 02/21/2012 1153   CALCIUM 9.4 07/06/2015 0939   PROT 6.6 07/06/2015 0939   ALBUMIN 3.9 07/06/2015 0939   AST 28 07/06/2015 0939   ALT 26 07/06/2015 0939   ALKPHOS 48 07/06/2015 0939   BILITOT 0.5 07/06/2015 0939   GFRNONAA >60 07/06/2015 0939   GFRAA >60 07/06/2015 0939     ASSESSMENT and THERAPY PLAN:  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 There are limited resources available for patients on IVIG therapy. I have advised her we have looked into these for other patients as well. She has opted to discontinue additional therapy. She will be consider starting treatment if her prior recurrent sinopulmonary symptoms return.  Orders Placed This Encounter  Procedures  . CBC with Differential      Standing Status: Future     Number of Occurrences:      Standing Expiration Date: 07/29/2016  . Comprehensive metabolic panel    Standing Status: Future     Number of Occurrences:      Standing Expiration Date: 07/29/2016  . IgG, IgA, IgM    Standing Status: Future     Number of Occurrences:      Standing Expiration Date: 07/29/2016    All questions were answered. The patient knows to call the clinic with any problems, questions or concerns. We can certainly see the patient much sooner if necessary.   Will cease IVIG for now F/u in 3 months Refer to Gynecology in [place].  This document serves as a record of services personally performed by Ancil Linsey, MD. It was created on her behalf by Janace Hoard, a trained medical scribe. The creation of this record is based on the scribe's personal observations and the provider's statements to them. This document has been checked and approved by the attending provider.  I have reviewed the above documentation for accuracy and completeness, and I agree with the above.  This note was electronically signed.  Kelby Fam. Whitney Muse, MD

## 2015-08-03 ENCOUNTER — Ambulatory Visit (HOSPITAL_COMMUNITY): Payer: Self-pay | Admitting: Psychiatry

## 2015-08-03 ENCOUNTER — Telehealth (HOSPITAL_COMMUNITY): Payer: Self-pay | Admitting: *Deleted

## 2015-08-03 NOTE — Telephone Encounter (Signed)
PT CALLED AT 9:00AM 08-03-15 STATING SHE HAVE TO TAKE CAN OF HER MOTHER DUE TO HER FATHER HAVING A STROKE AND BEING IN THE HOSPITAL SO SHE WILL NOT BE ABLE TO COME TO 08-03-15 APPT.../OR. OFFICE ASKED PT IF SHE WILL BE OUT OF HER MEDICATION AND PER PT, DR. Harrington Challenger DO NOT GIVE HER ANY PRESCRIPTION. OFFICE ASKED PT IF SHE WILL BE OUT OF HER CYMBALTA AND HER PT, DR. Harrington Challenger DO NOT FILL THIS MEDICATION FOR HER.

## 2015-08-06 ENCOUNTER — Ambulatory Visit (HOSPITAL_COMMUNITY): Payer: Self-pay | Admitting: Oncology

## 2015-08-06 ENCOUNTER — Other Ambulatory Visit (HOSPITAL_COMMUNITY): Payer: Self-pay

## 2015-08-06 ENCOUNTER — Ambulatory Visit (HOSPITAL_COMMUNITY): Payer: Self-pay

## 2015-08-07 ENCOUNTER — Other Ambulatory Visit (HOSPITAL_COMMUNITY): Payer: Self-pay

## 2015-08-07 ENCOUNTER — Ambulatory Visit (HOSPITAL_COMMUNITY): Payer: Self-pay

## 2015-08-31 ENCOUNTER — Encounter (HOSPITAL_COMMUNITY): Payer: Self-pay | Admitting: Hematology & Oncology

## 2015-09-29 ENCOUNTER — Telehealth: Payer: Self-pay | Admitting: Neurology

## 2015-09-29 DIAGNOSIS — R519 Headache, unspecified: Secondary | ICD-10-CM

## 2015-09-29 DIAGNOSIS — R51 Headache: Principal | ICD-10-CM

## 2015-09-29 NOTE — Telephone Encounter (Signed)
As you can see in prior telephone message, Dr. Rexene Alberts is not going to write order for CPAP supplies due to patient not having OSA. Is Dr. Jaynee Eagles going to order new supplies? I do not see any orders. Let me know if you need any help.

## 2015-09-29 NOTE — Telephone Encounter (Signed)
Patient came in months ago for sleep test and hasn't received her prescription for a mask yet.  Please call patient at [phone]

## 2015-09-30 NOTE — Telephone Encounter (Signed)
Michelle Clements, can you place the orders for cpap please? I don;t know how. Can you ask Beverlee Nims please?

## 2015-09-30 NOTE — Telephone Encounter (Signed)
[facility] has a Surveyor, minerals. I spoke to patient and advised her that we can send orders to [facility], she would like to use store in [place].

## 2015-09-30 NOTE — Telephone Encounter (Signed)
Spoke to patient. Advised placed the order for CPAP supplies (Mask). Asked patient were does she get her DME supplies. Patient stated [facility] but they do not cover the mask b/c of [employer]. Explained other supply companies may not cover due to not having OSA, but would print out Rx for mask to buy straight out at a DME company if needed.  Patient said she would contact her insurance and find out who will cover and call us back.

## 2015-10-27 ENCOUNTER — Encounter: Payer: Self-pay | Admitting: *Deleted

## 2015-10-27 NOTE — Progress Notes (Signed)
Faxed signed orders for CPAP supplies back to [facility]. Received fax confirmation.

## 2015-11-02 NOTE — Progress Notes (Signed)
This encounter was created in error - please disregard.

## 2015-11-03 ENCOUNTER — Other Ambulatory Visit (HOSPITAL_COMMUNITY): Payer: Self-pay

## 2015-11-03 ENCOUNTER — Ambulatory Visit (HOSPITAL_COMMUNITY): Payer: Self-pay | Admitting: Hematology & Oncology

## 2015-11-18 ENCOUNTER — Encounter (HOSPITAL_COMMUNITY): Payer: Self-pay | Admitting: Hematology & Oncology

## 2015-11-19 ENCOUNTER — Ambulatory Visit (HOSPITAL_COMMUNITY): Payer: Self-pay | Admitting: Hematology & Oncology

## 2015-11-19 ENCOUNTER — Other Ambulatory Visit (HOSPITAL_COMMUNITY): Payer: Self-pay

## 2015-11-20 NOTE — Progress Notes (Signed)
This encounter was created in error - please disregard.  This encounter was created in error - please disregard.

## 2015-12-15 ENCOUNTER — Encounter (HOSPITAL_COMMUNITY): Payer: Self-pay | Admitting: Hematology & Oncology

## 2015-12-15 ENCOUNTER — Encounter (HOSPITAL_COMMUNITY): Payer: Commercial Managed Care - HMO | Attending: Hematology & Oncology | Admitting: Hematology & Oncology

## 2015-12-15 ENCOUNTER — Other Ambulatory Visit (HOSPITAL_COMMUNITY): Payer: Self-pay | Admitting: Hematology & Oncology

## 2015-12-15 ENCOUNTER — Encounter (HOSPITAL_COMMUNITY): Payer: Commercial Managed Care - HMO

## 2015-12-15 VITALS — BP 131/67 | HR 75 | Temp 97.7°F | Resp 18 | Wt 231.2 lb

## 2015-12-15 DIAGNOSIS — D839 Common variable immunodeficiency, unspecified: Secondary | ICD-10-CM

## 2015-12-15 DIAGNOSIS — G622 Polyneuropathy due to other toxic agents: Secondary | ICD-10-CM

## 2015-12-15 DIAGNOSIS — R0602 Shortness of breath: Secondary | ICD-10-CM

## 2015-12-15 DIAGNOSIS — Z139 Encounter for screening, unspecified: Secondary | ICD-10-CM

## 2015-12-15 DIAGNOSIS — Z1231 Encounter for screening mammogram for malignant neoplasm of breast: Secondary | ICD-10-CM

## 2015-12-15 NOTE — Progress Notes (Signed)
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 female returns for follow-up of her common variable immunodeficiency.    Mrs. Noll is accompanied by her husband and daughter today.  The patient understands that she is due for a mammogram and has received two letters reminding her. She is agreeable to [place] setting up her mammogram. She received a flu shot in October.  She has been doing well over the winter season. Stating that she has not had issues with illness as she only gets out to go to church and [employer]. Her daughter mentions that she got out in the snow yesterday. Denies bowel or urinary issues. She has been eating and sleeping well.   Reports shortness of breath lately, "but not excessively so". Only experiences coughing when she has shortness of breath. Denies fever.   No other major complaints or concerns.    MEDICAL HISTORY: Past Medical History  Diagnosis Date  . Bipolar disorder ([place])   . Hip pain, left   . Essential hypertension, benign   . Neck pain   . Allergic rhinitis   . Fibromyalgia   . Constipation   . IBS (irritable bowel syndrome)   . Hyperlipidemia   . GERD (gastroesophageal reflux disease)   . Depression   . Asthma   . PTSD (post-traumatic stress disorder)   . Compulsive behavior disorder   . Type 2 diabetes mellitus ([place])   . History of pneumonia 1988  . Poor short term memory   . Urgency of urination   . Arthritis     Lower back and hips  . Anemia   . History of palpitations     Negative Holter monitor  . S/P Botox injection 11/2014     For migraine headaches  . 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[place])   . H/O sleep apnea     has Unspecified vitamin D deficiency; Mixed hyperlipidemia; Morbid obesity ([place]); BIPOLAR AFFECTIVE DISORDER; Hx of tobacco use,  presenting hazards to health; DEPRESSION; Essential hypertension, benign; IBS; FIBROMYALGIA; Palpitations; Neuropathy of upper extremity; History of stroke in prior 3 months; Headache(784.0); Cervicalgia; Acute confusional state; 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[place]); Migraine; and Neck pain of over 3 months duration on her problem list.     No history exists.     is allergic to sulfonamide derivatives; gluten meal; molds & smuts; clonazepam; dust mite extract; and tegretol.  Ms. Worton does not currently have medications on file.  SURGICAL HISTORY: Past Surgical History  Procedure Laterality Date  . Cholecystectomy    . Tubal ligation    . Muscle biopsy  2008    Right leg  . Carpal tunnel release Right 06/10/2013    Procedure: CARPAL TUNNEL RELEASE;  Surgeon: Faythe Ghee, MD;  Location: [facility];  Service: Neurosurgery;  Laterality: Right;  Right Carpal Tunnel Release   . Mole exc      x 3  . Dental surgery  12/2014    SOCIAL HISTORY: Social History   Social History  . Marital Status: Married    Spouse Name: N/A  . Number of Children: 2  . Years of Education: College   Occupational History  . Umemployed     Now disabled due to bipolar and fibromyalgia   Social History Main Topics  . Smoking status: Former Smoker    Types: Cigarettes  Quit date: 11/04/2010  . Smokeless tobacco: Never Used  . Alcohol Use: No  . Drug Use: No  . Sexual Activity: Yes   Other Topics Concern  . Not on file   Social History Narrative   Married   Lives with spouse and daughter   Right handed.   Caffeine use: 2 cups coffee in morning and 2 cups tea during the day     FAMILY HISTORY: Family History  Problem Relation Age of Onset  . Fibromyalgia Mother   . Diabetes type II Mother   . Depression Mother   . Alzheimer's disease Mother   . GER disease Mother   . Heart disease Mother   . Paranoid behavior Mother   . Dementia Mother   . Heart attack Father 31    MI x5  .  Stroke Father     CVA x7  . Asthma Father   . Brain cancer Father   . Seizures Father   . Anxiety disorder Sister   . OCD Sister   . Sexual abuse Sister   . Physical abuse Sister   . Anxiety disorder Sister   . Alcohol abuse Neg Hx   . Drug abuse Neg Hx   . Schizophrenia Neg Hx   . ADD / ADHD Daughter   . ADD / ADHD Daughter     Review of Systems  Constitutional: Negative. HENT: Negative.   Eyes: Negative.   Respiratory: Positive for intermittent shortness of breath and coughing.  Cardiovascular: Negative.   Gastrointestinal: Negative  Genitourinary: Negative Musculoskeletal: Negative. Skin: Negative.   Neurological: Negative.   Endo/Heme/Allergies: Negative.   Psychiatric/Behavioral: Negative.    14 point review of systems was performed and is negative except as detailed under history of present illness and above   PHYSICAL EXAMINATION  ECOG PERFORMANCE STATUS: 0 - Asymptomatic  Filed Vitals:   12/15/15 1024  BP: 131/67  Pulse: 75  Temp: 97.7 F (36.5 C)  Resp: 18    Physical Exam  Constitutional: She is oriented to person, place, and time and well-developed, well-nourished, and in no distress.  HENT:  Head: Normocephalic and atraumatic.  Nose: Nose normal.  Mouth/Throat: Oropharynx is clear and moist. No oropharyngeal exudate.  Eyes: Conjunctivae and EOM are normal. Pupils are equal, round, and reactive to light. Right eye exhibits no discharge. Left eye exhibits no discharge. No scleral icterus.  Neck: Normal range of motion. Neck supple. No tracheal deviation present. No thyromegaly present.  Cardiovascular: Normal rate, regular rhythm and normal heart sounds.  Exam reveals no gallop and no friction rub.   No murmur heard. Pulmonary/Chest: Effort normal and breath sounds normal. She has no wheezes. She has no rales.  Abdominal: Soft. Bowel sounds are normal. She exhibits no distension and no mass. There is no tenderness. There is no rebound and no  guarding.  Musculoskeletal: Normal range of motion. She exhibits no edema.  Lymphadenopathy:    She has no cervical adenopathy.  Neurological: She is alert and oriented to person, place, and time. She has normal reflexes. No cranial nerve deficit. Gait normal. Coordination normal.  Skin: Skin is warm and dry. No rash noted.  Psychiatric: Mood, memory, affect and judgment normal.  Nursing note and vitals reviewed.   LABORATORY DATA: I have reviewed the data as listed. CBC    Component Value Date/Time   WBC 7.8 07/06/2015 0939   RBC 4.27 07/06/2015 0939   RBC 4.24 08/27/2014 0842   HGB 12.6 07/06/2015 0939  HCT 37.0 07/06/2015 0939   PLT 315 07/06/2015 0939   MCV 86.7 07/06/2015 0939   MCH 29.5 07/06/2015 0939   MCHC 34.1 07/06/2015 0939   RDW 13.6 07/06/2015 0939   LYMPHSABS 2.5 07/06/2015 0939   MONOABS 0.4 07/06/2015 0939   EOSABS 0.3 07/06/2015 0939   BASOSABS 0.1 07/06/2015 0939   CMP     Component Value Date/Time   NA 136 07/06/2015 0939   K 3.9 07/06/2015 0939   CL 103 07/06/2015 0939   CO2 26 07/06/2015 0939   GLUCOSE 223* 07/06/2015 0939   BUN 9 07/06/2015 0939   CREATININE 0.83 07/06/2015 0939   CREATININE 0.88 02/21/2012 1153   CALCIUM 9.4 07/06/2015 0939   PROT 6.6 07/06/2015 0939   ALBUMIN 3.9 07/06/2015 0939   AST 28 07/06/2015 0939   ALT 26 07/06/2015 0939   ALKPHOS 48 07/06/2015 0939   BILITOT 0.5 07/06/2015 0939   GFRNONAA >60 07/06/2015 0939   GFRAA >60 07/06/2015 0939     ASSESSMENT and THERAPY PLAN:  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 There are limited resources available for patients on IVIG therapy. I have advised her we have looked into these for other patients as well. She has opted to discontinue additional therapy. She will be consider starting treatment if her prior recurrent  sinopulmonary symptoms return.  She is doing well. We discuss a return appointment in 6 months with repeat labs including quantitative immunoglobulins. I have ordered a screening mammogram.  Orders Placed This Encounter  Procedures  . MM Digital Screening    Standing Status: Future     Number of Occurrences:      Standing Expiration Date: 12/14/2016    Order Specific Question:  Reason for Exam (SYMPTOM  OR DIAGNOSIS REQUIRED)    Answer:  screening    Order Specific Question:  Is the patient pregnant?    Answer:  No    Order Specific Question:  Preferred imaging location?    Answer:  [facility]  . CBC with Differential    Standing Status: Future     Number of Occurrences:      Standing Expiration Date: 12/14/2016  . IgG, IgA, IgM    Standing Status: Future     Number of Occurrences:      Standing Expiration Date: 12/14/2016    All questions were answered. The patient knows to call the clinic with any problems, questions or concerns. We can certainly see the patient much sooner if necessary.   This document serves as a record of services personally performed by Ancil Linsey, MD. It was created on her behalf by Arlyce Harman, a trained medical scribe. The creation of this record is based on the scribe's personal observations and the provider's statements to them. This document has been checked and approved by the attending provider.  I have reviewed the above documentation for accuracy and completeness, and I agree with the above.  This note was electronically signed.  Kelby Fam. Whitney Muse, MD

## 2015-12-15 NOTE — Patient Instructions (Signed)
[place] at [facility] Discharge Instructions  RECOMMENDATIONS MADE BY THE CONSULTANT AND ANY TEST RESULTS WILL BE SENT TO YOUR REFERRING PHYSICIAN.  Exam and discussion today with Dr. Whitney Muse. Return in 6 months for lab work and office visit.  Call the clinic with any questions or concerns.   Thank you for choosing [place] at [facility] to provide your oncology and hematology care.  To afford each patient quality time with our provider, please arrive at least 15 minutes before your scheduled appointment time.    You need to re-schedule your appointment should you arrive 10 or more minutes late.  We strive to give you quality time with our providers, and arriving late affects you and other patients whose appointments are after yours.  Also, if you no show three or more times for appointments you may be dismissed from the clinic at the providers discretion.     Again, thank you for choosing [facility].  Our hope is that these requests will decrease the amount of time that you wait before being seen by our physicians.       _____________________________________________________________  Should you have questions after your visit to [facility], please contact our office at [phone] between the hours of 8:30 a.m. and 4:30 p.m.  Voicemails left after 4:30 p.m. will not be returned until the following business day.  For prescription refill requests, have your pharmacy contact our office.

## 2015-12-21 ENCOUNTER — Ambulatory Visit (HOSPITAL_COMMUNITY)
Admission: RE | Admit: 2015-12-21 | Discharge: 2015-12-21 | Disposition: A | Discharge status: Home / Self Care | Payer: PPO | Source: Ambulatory Visit | Attending: Hematology & Oncology | Admitting: Hematology & Oncology

## 2015-12-21 DIAGNOSIS — Z1231 Encounter for screening mammogram for malignant neoplasm of breast: Secondary | ICD-10-CM | POA: Insufficient documentation

## 2016-01-05 ENCOUNTER — Ambulatory Visit (HOSPITAL_COMMUNITY): Payer: Self-pay | Admitting: Psychiatry

## 2016-01-06 ENCOUNTER — Encounter (HOSPITAL_COMMUNITY): Payer: Self-pay | Admitting: Psychiatry

## 2016-01-06 ENCOUNTER — Ambulatory Visit (INDEPENDENT_AMBULATORY_CARE_PROVIDER_SITE_OTHER): Payer: PPO | Admitting: Psychiatry

## 2016-01-06 VITALS — BP 128/82 | HR 85 | Ht 71.0 in | Wt 228.0 lb

## 2016-01-06 DIAGNOSIS — F332 Major depressive disorder, recurrent severe without psychotic features: Secondary | ICD-10-CM

## 2016-01-06 DIAGNOSIS — F3163 Bipolar disorder, current episode mixed, severe, without psychotic features: Secondary | ICD-10-CM

## 2016-01-06 MED ORDER — DULOXETINE HCL 30 MG PO CPEP: 30.0000 mg | ORAL_CAPSULE | Freq: Two times a day (BID) | ORAL | Status: DC

## 2016-01-06 MED ORDER — CLONAZEPAM 0.5 MG PO TABS: 0.5000 mg | ORAL_TABLET | Freq: Two times a day (BID) | ORAL | Status: DC | PRN

## 2016-01-06 MED ORDER — RISPERIDONE 0.5 MG PO TABS: 0.5000 mg | ORAL_TABLET | Freq: Every day | ORAL | Status: DC
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
## 2016-01-08 ENCOUNTER — Ambulatory Visit (HOSPITAL_COMMUNITY)
Admission: RE | Admit: 2016-01-08 | Discharge: 2016-01-08 | Disposition: A | Discharge status: Home / Self Care | Payer: PPO | Source: Ambulatory Visit | Attending: Internal Medicine | Admitting: Internal Medicine

## 2016-01-08 ENCOUNTER — Other Ambulatory Visit (HOSPITAL_COMMUNITY): Payer: Self-pay | Admitting: Internal Medicine

## 2016-01-08 DIAGNOSIS — M5032 Other cervical disc degeneration, mid-cervical region, unspecified level: Secondary | ICD-10-CM | POA: Insufficient documentation

## 2016-01-08 DIAGNOSIS — R52 Pain, unspecified: Secondary | ICD-10-CM

## 2016-01-08 DIAGNOSIS — M542 Cervicalgia: Secondary | ICD-10-CM

## 2016-01-08 DIAGNOSIS — M25512 Pain in left shoulder: Secondary | ICD-10-CM | POA: Insufficient documentation

## 2016-01-08 DIAGNOSIS — M50322 Other cervical disc degeneration at C5-C6 level: Secondary | ICD-10-CM | POA: Diagnosis not present

## 2016-01-08 DIAGNOSIS — M50323 Other cervical disc degeneration at C6-C7 level: Secondary | ICD-10-CM | POA: Diagnosis not present

## 2016-01-11 ENCOUNTER — Other Ambulatory Visit (HOSPITAL_COMMUNITY): Payer: Self-pay | Admitting: Psychiatry

## 2016-01-11 ENCOUNTER — Telehealth (HOSPITAL_COMMUNITY): Payer: Self-pay | Admitting: *Deleted

## 2016-01-11 NOTE — Telephone Encounter (Signed)
Pt pharmacy called requesting 180 days for pt Cymbalta and 90 days supply for pt risperiDONE. Per pt pharmacy due to pt using a mail order, they need 3 months supply for pt medications. Pharmacy number is [phone]. 

## 2016-01-11 NOTE — Telephone Encounter (Signed)
You may send in 90 days of each with one refill. Find out which mail-order pharmacy, 2 are listed

## 2016-01-13 ENCOUNTER — Telehealth (HOSPITAL_COMMUNITY): Payer: Self-pay | Admitting: *Deleted

## 2016-01-13 MED ORDER — DULOXETINE HCL 30 MG PO CPEP: 30.0000 mg | ORAL_CAPSULE | Freq: Two times a day (BID) | ORAL | Status: DC

## 2016-01-13 MED ORDER — RISPERIDONE 0.5 MG PO TABS: 0.5000 mg | ORAL_TABLET | Freq: Every day | ORAL | Status: DC

## 2016-01-13 NOTE — Telephone Encounter (Signed)
Sent 90 days to pt pharmacy with 1 refill per Dr. Harrington Challenger

## 2016-01-13 NOTE — Telephone Encounter (Signed)
sent 

## 2016-01-20 DIAGNOSIS — M542 Cervicalgia: Secondary | ICD-10-CM | POA: Diagnosis not present

## 2016-01-20 DIAGNOSIS — E119 Type 2 diabetes mellitus without complications: Secondary | ICD-10-CM | POA: Diagnosis not present

## 2016-01-20 DIAGNOSIS — R079 Chest pain, unspecified: Secondary | ICD-10-CM | POA: Diagnosis not present

## 2016-01-28 ENCOUNTER — Encounter (HOSPITAL_COMMUNITY): Payer: Self-pay | Admitting: Hematology & Oncology

## 2016-01-29 DIAGNOSIS — G4733 Obstructive sleep apnea (adult) (pediatric): Secondary | ICD-10-CM | POA: Diagnosis not present

## 2016-02-04 ENCOUNTER — Ambulatory Visit (HOSPITAL_COMMUNITY): Payer: Self-pay | Admitting: Psychiatry

## 2016-02-05 DIAGNOSIS — F339 Major depressive disorder, recurrent, unspecified: Secondary | ICD-10-CM | POA: Diagnosis not present

## 2016-02-05 DIAGNOSIS — D839 Common variable immunodeficiency, unspecified: Secondary | ICD-10-CM | POA: Diagnosis not present

## 2016-02-05 DIAGNOSIS — K581 Irritable bowel syndrome with constipation: Secondary | ICD-10-CM | POA: Diagnosis not present

## 2016-02-05 DIAGNOSIS — F411 Generalized anxiety disorder: Secondary | ICD-10-CM | POA: Diagnosis not present

## 2016-02-05 DIAGNOSIS — J00 Acute nasopharyngitis [common cold]: Secondary | ICD-10-CM | POA: Diagnosis not present

## 2016-02-09 ENCOUNTER — Encounter: Payer: Self-pay | Admitting: *Deleted

## 2016-02-09 NOTE — Progress Notes (Signed)
Faxed signed orders for CPAP mask back to [facility]. Fax: [phone]. Received confirmation. Sent copy to MR.

## 2016-02-10 ENCOUNTER — Encounter (HOSPITAL_COMMUNITY): Payer: Self-pay | Admitting: Psychiatry

## 2016-02-10 ENCOUNTER — Ambulatory Visit (INDEPENDENT_AMBULATORY_CARE_PROVIDER_SITE_OTHER): Payer: PPO | Admitting: Psychiatry

## 2016-02-10 DIAGNOSIS — F332 Major depressive disorder, recurrent severe without psychotic features: Secondary | ICD-10-CM | POA: Diagnosis not present

## 2016-02-10 NOTE — Patient Instructions (Signed)
Discussed orally 
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
## 2016-02-17 ENCOUNTER — Encounter (HOSPITAL_COMMUNITY): Payer: Self-pay | Admitting: Psychiatry

## 2016-02-17 ENCOUNTER — Ambulatory Visit (INDEPENDENT_AMBULATORY_CARE_PROVIDER_SITE_OTHER): Payer: PPO | Admitting: Psychiatry

## 2016-02-17 VITALS — BP 112/56 | HR 75 | Ht 71.0 in | Wt 238.0 lb

## 2016-02-17 DIAGNOSIS — F3160 Bipolar disorder, current episode mixed, unspecified: Secondary | ICD-10-CM

## 2016-02-17 DIAGNOSIS — F332 Major depressive disorder, recurrent severe without psychotic features: Secondary | ICD-10-CM

## 2016-02-17 MED ORDER — RISPERIDONE 0.5 MG PO TABS: 0.5000 mg | ORAL_TABLET | Freq: Every day | ORAL | Status: DC

## 2016-02-17 MED ORDER — CLONAZEPAM 0.5 MG PO TABS: 0.5000 mg | ORAL_TABLET | Freq: Two times a day (BID) | ORAL | Status: DC | PRN

## 2016-02-17 MED ORDER — DULOXETINE HCL 30 MG PO CPEP: 30.0000 mg | ORAL_CAPSULE | Freq: Two times a day (BID) | ORAL | Status: DC
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
## 2016-03-07 ENCOUNTER — Encounter (HOSPITAL_COMMUNITY): Payer: PPO | Attending: Hematology & Oncology

## 2016-03-07 ENCOUNTER — Telehealth (HOSPITAL_COMMUNITY): Payer: Self-pay | Admitting: *Deleted

## 2016-03-07 ENCOUNTER — Encounter (HOSPITAL_COMMUNITY): Payer: PPO | Admitting: Hematology & Oncology

## 2016-03-07 DIAGNOSIS — Z8673 Personal history of transient ischemic attack (TIA), and cerebral infarction without residual deficits: Secondary | ICD-10-CM | POA: Diagnosis not present

## 2016-03-07 DIAGNOSIS — Z8249 Family history of ischemic heart disease and other diseases of the circulatory system: Secondary | ICD-10-CM | POA: Diagnosis not present

## 2016-03-07 DIAGNOSIS — E782 Mixed hyperlipidemia: Secondary | ICD-10-CM | POA: Diagnosis not present

## 2016-03-07 DIAGNOSIS — Z823 Family history of stroke: Secondary | ICD-10-CM | POA: Insufficient documentation

## 2016-03-07 DIAGNOSIS — Z87891 Personal history of nicotine dependence: Secondary | ICD-10-CM | POA: Insufficient documentation

## 2016-03-07 DIAGNOSIS — D838 Other common variable immunodeficiencies: Secondary | ICD-10-CM | POA: Insufficient documentation

## 2016-03-07 DIAGNOSIS — F4310 Post-traumatic stress disorder, unspecified: Secondary | ICD-10-CM | POA: Diagnosis not present

## 2016-03-07 DIAGNOSIS — E559 Vitamin D deficiency, unspecified: Secondary | ICD-10-CM | POA: Diagnosis not present

## 2016-03-07 DIAGNOSIS — Z9889 Other specified postprocedural states: Secondary | ICD-10-CM | POA: Diagnosis not present

## 2016-03-07 DIAGNOSIS — R51 Headache: Secondary | ICD-10-CM | POA: Diagnosis not present

## 2016-03-07 DIAGNOSIS — G629 Polyneuropathy, unspecified: Secondary | ICD-10-CM | POA: Diagnosis not present

## 2016-03-07 DIAGNOSIS — I10 Essential (primary) hypertension: Secondary | ICD-10-CM | POA: Insufficient documentation

## 2016-03-07 DIAGNOSIS — E119 Type 2 diabetes mellitus without complications: Secondary | ICD-10-CM | POA: Insufficient documentation

## 2016-03-07 DIAGNOSIS — G4730 Sleep apnea, unspecified: Secondary | ICD-10-CM | POA: Insufficient documentation

## 2016-03-07 DIAGNOSIS — Z8489 Family history of other specified conditions: Secondary | ICD-10-CM | POA: Insufficient documentation

## 2016-03-07 DIAGNOSIS — M797 Fibromyalgia: Secondary | ICD-10-CM | POA: Diagnosis not present

## 2016-03-07 DIAGNOSIS — K5900 Constipation, unspecified: Secondary | ICD-10-CM | POA: Insufficient documentation

## 2016-03-07 DIAGNOSIS — M1990 Unspecified osteoarthritis, unspecified site: Secondary | ICD-10-CM | POA: Diagnosis not present

## 2016-03-07 DIAGNOSIS — F319 Bipolar disorder, unspecified: Secondary | ICD-10-CM | POA: Insufficient documentation

## 2016-03-07 DIAGNOSIS — K589 Irritable bowel syndrome without diarrhea: Secondary | ICD-10-CM | POA: Insufficient documentation

## 2016-03-07 DIAGNOSIS — D839 Common variable immunodeficiency, unspecified: Secondary | ICD-10-CM

## 2016-03-07 DIAGNOSIS — J309 Allergic rhinitis, unspecified: Secondary | ICD-10-CM | POA: Insufficient documentation

## 2016-03-07 DIAGNOSIS — Z9851 Tubal ligation status: Secondary | ICD-10-CM | POA: Diagnosis not present

## 2016-03-07 DIAGNOSIS — Z9049 Acquired absence of other specified parts of digestive tract: Secondary | ICD-10-CM | POA: Diagnosis not present

## 2016-03-07 DIAGNOSIS — J45909 Unspecified asthma, uncomplicated: Secondary | ICD-10-CM | POA: Insufficient documentation

## 2016-03-07 LAB — CBC WITH DIFFERENTIAL/PLATELET
BASOS PCT: 1 %
Basophils Absolute: 0.1 10*3/uL (ref 0.0–0.1)
EOS ABS: 0.3 10*3/uL (ref 0.0–0.7)
EOS PCT: 4 %
HCT: 34.6 % — ABNORMAL LOW (ref 36.0–46.0)
Hemoglobin: 11.3 g/dL — ABNORMAL LOW (ref 12.0–15.0)
Lymphocytes Relative: 25 %
Lymphs Abs: 2 10*3/uL (ref 0.7–4.0)
MCH: 26.3 pg (ref 26.0–34.0)
MCHC: 32.7 g/dL (ref 30.0–36.0)
MCV: 80.7 fL (ref 78.0–100.0)
MONO ABS: 0.5 10*3/uL (ref 0.1–1.0)
MONOS PCT: 6 %
NEUTROS PCT: 66 %
Neutro Abs: 5.3 10*3/uL (ref 1.7–7.7)
PLATELETS: 333 10*3/uL (ref 150–400)
RBC: 4.29 MIL/uL (ref 3.87–5.11)
RDW: 15 % (ref 11.5–15.5)
WBC: 8 10*3/uL (ref 4.0–10.5)

## 2016-03-07 NOTE — Telephone Encounter (Signed)
PATIENT CANCELED DUE TO $40 COPAY.  SHE CAN'T AFFORD.

## 2016-03-07 NOTE — Patient Instructions (Signed)
[facility] at [facility] Discharge Instructions  RECOMMENDATIONS MADE BY THE CONSULTANT AND ANY TEST RESULTS WILL BE SENT TO YOUR REFERRING PHYSICIAN.   Exam and discussion by Dr Penland today  Return to see the doctor in Please call the clinic if you have any questions or concerns    Thank you for choosing [facility] at [facility] to provide your oncology and hematology care.  To afford each patient quality time with our provider, please arrive at least 15 minutes before your scheduled appointment time.   Beginning January 23rd 2017 lab work for the Cancer Center will be done in the  Main lab at [place] on 1st floor. If you have a lab appointment with the Cancer Center please come in thru the  Main Entrance and check in at the main information desk  You need to re-schedule your appointment should you arrive 10 or more minutes late.  We strive to give you quality time with our providers, and arriving late affects you and other patients whose appointments are after yours.  Also, if you no show three or more times for appointments you may be dismissed from the clinic at the providers discretion.     Again, thank you for choosing [facility].  Our hope is that these requests will decrease the amount of time that you wait before being seen by our physicians.       _____________________________________________________________  Should you have questions after your visit to [facility], please contact our office at [phone] between the hours of 8:30 a.m. and 4:30 p.m.  Voicemails left after 4:30 p.m. will not be returned until the following business day.  For prescription refill requests, have your pharmacy contact our office.         Resources For Cancer Patients and their Caregivers ? American Cancer Society: Can assist with transportation, wigs, general needs, runs Look Good Feel Better.         1-888-227-6333 ? Cancer Care: Provides financial assistance, online support groups, medication/co-pay assistance.  1-800-813-HOPE (4673) ? [facility] Assists [place] cancer patients and their families through emotional , educational and financial support.  336-427-4357 ? [place] DSS Where to apply for food stamps, Medicaid and utility assistance. 336-342-1394 ? [facility]: Transportation to medical appointments. 336-347-2287 ? Social Security Administration: May apply for disability if have a Stage IV cancer. 336-342-7796 1-800-772-1213 ? [place] Aging, Disability and Transit Services: Assists with nutrition, care and transit needs. 336-349-2343  

## 2016-03-08 LAB — IGG, IGA, IGM
IGA: 58 mg/dL — AB (ref 87–352)
IGM, SERUM: 26 mg/dL (ref 26–217)
IgG (Immunoglobin G), Serum: 555 mg/dL — ABNORMAL LOW (ref 700–1600)

## 2016-03-10 ENCOUNTER — Ambulatory Visit (HOSPITAL_COMMUNITY): Payer: Self-pay | Admitting: Psychiatry

## 2016-03-16 DIAGNOSIS — E039 Hypothyroidism, unspecified: Secondary | ICD-10-CM | POA: Diagnosis not present

## 2016-03-16 DIAGNOSIS — E559 Vitamin D deficiency, unspecified: Secondary | ICD-10-CM | POA: Diagnosis not present

## 2016-03-16 DIAGNOSIS — E782 Mixed hyperlipidemia: Secondary | ICD-10-CM | POA: Diagnosis not present

## 2016-03-16 DIAGNOSIS — E119 Type 2 diabetes mellitus without complications: Secondary | ICD-10-CM | POA: Diagnosis not present

## 2016-03-21 DIAGNOSIS — M797 Fibromyalgia: Secondary | ICD-10-CM | POA: Diagnosis not present

## 2016-03-21 DIAGNOSIS — M5441 Lumbago with sciatica, right side: Secondary | ICD-10-CM | POA: Diagnosis not present

## 2016-03-21 DIAGNOSIS — M47816 Spondylosis without myelopathy or radiculopathy, lumbar region: Secondary | ICD-10-CM | POA: Diagnosis not present

## 2016-03-21 DIAGNOSIS — M9903 Segmental and somatic dysfunction of lumbar region: Secondary | ICD-10-CM | POA: Diagnosis not present

## 2016-03-22 DIAGNOSIS — E782 Mixed hyperlipidemia: Secondary | ICD-10-CM | POA: Diagnosis not present

## 2016-03-22 DIAGNOSIS — M797 Fibromyalgia: Secondary | ICD-10-CM | POA: Diagnosis not present

## 2016-03-22 DIAGNOSIS — E119 Type 2 diabetes mellitus without complications: Secondary | ICD-10-CM | POA: Diagnosis not present

## 2016-03-24 ENCOUNTER — Ambulatory Visit (HOSPITAL_COMMUNITY): Payer: Self-pay | Admitting: Psychiatry

## 2016-03-24 DIAGNOSIS — M47816 Spondylosis without myelopathy or radiculopathy, lumbar region: Secondary | ICD-10-CM | POA: Diagnosis not present

## 2016-03-24 DIAGNOSIS — M797 Fibromyalgia: Secondary | ICD-10-CM | POA: Diagnosis not present

## 2016-03-24 DIAGNOSIS — M5441 Lumbago with sciatica, right side: Secondary | ICD-10-CM | POA: Diagnosis not present

## 2016-03-24 DIAGNOSIS — M9903 Segmental and somatic dysfunction of lumbar region: Secondary | ICD-10-CM | POA: Diagnosis not present

## 2016-03-28 DIAGNOSIS — M47816 Spondylosis without myelopathy or radiculopathy, lumbar region: Secondary | ICD-10-CM | POA: Diagnosis not present

## 2016-03-28 DIAGNOSIS — M797 Fibromyalgia: Secondary | ICD-10-CM | POA: Diagnosis not present

## 2016-03-28 DIAGNOSIS — M5441 Lumbago with sciatica, right side: Secondary | ICD-10-CM | POA: Diagnosis not present

## 2016-03-28 DIAGNOSIS — M9903 Segmental and somatic dysfunction of lumbar region: Secondary | ICD-10-CM | POA: Diagnosis not present

## 2016-03-29 ENCOUNTER — Encounter (HOSPITAL_COMMUNITY): Payer: Self-pay | Admitting: Hematology & Oncology

## 2016-03-29 ENCOUNTER — Encounter (HOSPITAL_COMMUNITY): Payer: PPO

## 2016-03-29 ENCOUNTER — Encounter (HOSPITAL_BASED_OUTPATIENT_CLINIC_OR_DEPARTMENT_OTHER): Payer: PPO | Admitting: Hematology & Oncology

## 2016-03-29 VITALS — BP 128/69 | HR 98 | Temp 98.0°F | Resp 0 | Wt 232.0 lb

## 2016-03-29 DIAGNOSIS — D839 Common variable immunodeficiency, unspecified: Secondary | ICD-10-CM | POA: Diagnosis not present

## 2016-03-29 DIAGNOSIS — D838 Other common variable immunodeficiencies: Secondary | ICD-10-CM | POA: Diagnosis not present

## 2016-03-29 NOTE — Patient Instructions (Signed)
[place] at [facility] Discharge Instructions  RECOMMENDATIONS MADE BY THE CONSULTANT AND ANY TEST RESULTS WILL BE SENT TO YOUR REFERRING PHYSICIAN.    Seen by Dr Whitney Muse today Labs today, we will call you with those results Return to see the doctor in 3 months with lab work     Thank you for choosing [place] at [facility] to provide your oncology and hematology care.  To afford each patient quality time with our provider, please arrive at least 15 minutes before your scheduled appointment time.   Beginning January 23rd 2017 lab work for the [facility] will be done in the  Main lab at [facility] on 1st floor. If you have a lab appointment with the [place] please come in thru the  Main Entrance and check in at the main information desk  You need to re-schedule your appointment should you arrive 10 or more minutes late.  We strive to give you quality time with our providers, and arriving late affects you and other patients whose appointments are after yours.  Also, if you no show three or more times for appointments you may be dismissed from the clinic at the providers discretion.     Again, thank you for choosing [facility].  Our hope is that these requests will decrease the amount of time that you wait before being seen by our physicians.       _____________________________________________________________  Should you have questions after your visit to [facility], please contact our office at [phone] between the hours of 8:30 a.m. and 4:30 p.m.  Voicemails left after 4:30 p.m. will not be returned until the following business day.  For prescription refill requests, have your pharmacy contact our office.         Resources For Cancer Patients and their Caregivers ? American Cancer Society: Can assist with transportation, wigs, general needs, runs Look Good Feel Better.         (225) 702-8916 ? Cancer Care: Provides financial assistance, online support groups, medication/co-pay assistance.  1-800-813-HOPE (731)271-6246) ? [facility] Assists [place] cancer patients and their families through emotional , educational and financial support.  236-505-6489 ? [place] DSS Where to apply for food stamps, Medicaid and utility assistance. (615)147-3968 ? [facility]: Transportation to medical appointments. 719-289-8540 ? Social Security Administration: May apply for disability if have a Stage IV cancer. 709-435-8894 (802) 163-2181 ? [employer], Disability and Transit Services: Assists with nutrition, care and transit needs. (614) 134-1792

## 2016-03-29 NOTE — Progress Notes (Signed)
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 female returns for follow-up of her common variable immunodeficiency.    [name] returns to the [facility] today unaccompanied.  She notes that her sinus problems are all there, and couldn't go to church on Sunday because she was so red, swollen, and burning. She remarks that "it was almost like a gel in my throat." She says "it's not always horrible," but that it does happen.  She confirms that the IVIG is still a cost issue, and that it's about $2,000 to have her infusion each time she comes. She says she can't continue to pay this out of pocket each time. She wonders if she could challenge her insurance company, making IgG a product of the blood. She notes a remarkable difference in how she feels when her IgG levels fall, she states her sinuses act up continuously and she has significant fatigue.   [name] has had her mammogram this year.  She currently denies fever, cough, nausea, vomiting, SOB or wheezing.    MEDICAL HISTORY: Past Medical History  Diagnosis Date  . Bipolar disorder ([place])   . Hip pain, left   . Essential hypertension, benign   . Neck pain   . Allergic rhinitis   . Fibromyalgia   . Constipation   . IBS (irritable bowel syndrome)   . Hyperlipidemia   . GERD (gastroesophageal reflux disease)   . Depression   . Asthma   . PTSD (post-traumatic stress disorder)   . Compulsive behavior disorder   . Type 2 diabetes mellitus ([place])   . History of pneumonia 1988  . Poor short term memory   . Urgency of urination   . Arthritis     Lower back and hips  . Anemia   . History of palpitations     Negative Holter monitor  . S/P Botox injection 11/2014     For migraine  headaches  . 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 H/O sleep apnea     has Unspecified vitamin D deficiency; Mixed hyperlipidemia; Morbid obesity ([place]); BIPOLAR AFFECTIVE DISORDER; Hx of tobacco use, presenting hazards to health; DEPRESSION; Essential hypertension, benign; IBS; FIBROMYALGIA; Palpitations; Neuropathy of upper extremity; History of stroke in prior 3 months; Headache(784.0); Cervicalgia; Acute confusional state; 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[place]); Migraine; and Neck pain of over 3 months duration on her problem list.     No history exists.     is allergic to sulfonamide derivatives; gluten meal; molds & smuts; clonazepam; dust mite extract; and tegretol.  [name] had no medications administered during this visit.  SURGICAL HISTORY: Past Surgical History  Procedure Laterality Date  . Cholecystectomy    . Tubal ligation    . Muscle biopsy  2008    Right leg  . Carpal tunnel release Right 06/10/2013    Procedure: CARPAL TUNNEL RELEASE;  Surgeon: Faythe Ghee, MD;  Location: [facility];  Service: Neurosurgery;  Laterality: Right;  Right Carpal Tunnel Release   . Mole exc      x 3  . Dental surgery  12/2014    SOCIAL HISTORY: Social History   Social History  . Marital Status: Married    Spouse Name: N/A  . Number of Children: 2  . Years of  Education: College   Occupational History  . Umemployed     Now disabled due to bipolar and fibromyalgia   Social History Main Topics  . Smoking status: Former Smoker    Types: Cigarettes    Quit date: 11/04/2010  . Smokeless tobacco: Never Used  . Alcohol Use: 0.0 oz/week    0 Standard drinks or equivalent per week     Comment: one mixed drink per week  . Drug Use: No  . Sexual Activity: Yes    Birth Control/ Protection: Surgical   Other Topics Concern  . Not on file   Social History Narrative   Married   Lives with spouse and daughter   Right handed.   Caffeine use: 2 cups coffee in morning  and 2 cups tea during the day     FAMILY HISTORY: Family History  Problem Relation Age of Onset  . Fibromyalgia Mother   . Diabetes type II Mother   . Depression Mother   . Alzheimer's disease Mother   . GER disease Mother   . Heart disease Mother   . Paranoid behavior Mother   . Dementia Mother   . Heart attack Father 31    MI x5  . Stroke Father     CVA x7  . Asthma Father   . Brain cancer Father   . Seizures Father   . Anxiety disorder Sister   . OCD Sister   . Sexual abuse Sister   . Physical abuse Sister   . Anxiety disorder Sister   . Alcohol abuse Neg Hx   . Drug abuse Neg Hx   . Schizophrenia Neg Hx   . ADD / ADHD Daughter   . ADD / ADHD Daughter     Review of Systems  Constitutional: Negative. HENT: Negative.   Eyes: Negative.   Respiratory: Positive for intermittent shortness of breath and coughing.  Cardiovascular: Negative.   Gastrointestinal: Negative  Genitourinary: Negative Musculoskeletal: Negative. Skin: Negative.   Neurological: Negative.   Endo/Heme/Allergies: Negative.   Psychiatric/Behavioral: Negative.    14 point review of systems was performed and is negative except as detailed under history of present illness and above   PHYSICAL EXAMINATION  ECOG PERFORMANCE STATUS: 0 - Asymptomatic  Filed Vitals:   03/29/16 1200  BP: 128/69  Pulse: 98  Temp: 98 F (36.7 C)  Resp: 0    Physical Exam  Constitutional: She is oriented to person, place, and time and well-developed, well-nourished, and in no distress.  HENT:  Head: Normocephalic and atraumatic.  Nose: Nose normal.  Mouth/Throat: Oropharynx is clear and moist. No oropharyngeal exudate.  Eyes: Conjunctivae and EOM are normal. Pupils are equal, round, and reactive to light. Right eye exhibits no discharge. Left eye exhibits no discharge. No scleral icterus.  Neck: Normal range of motion. Neck supple. No tracheal deviation present. No thyromegaly present.  Cardiovascular: Normal  rate, regular rhythm and normal heart sounds.  Exam reveals no gallop and no friction rub.   No murmur heard. Pulmonary/Chest: Effort normal and breath sounds normal. She has no wheezes. She has no rales.  Abdominal: Soft. Bowel sounds are normal. She exhibits no distension and no mass. There is no tenderness. There is no rebound and no guarding.  Musculoskeletal: Normal range of motion. She exhibits no edema.  Lymphadenopathy:    She has no cervical adenopathy.  Neurological: She is alert and oriented to person, place, and time. She has normal reflexes. No cranial nerve deficit. Gait normal. Coordination  normal.  Skin: Skin is warm and dry. No rash noted.  Psychiatric: Mood, memory, affect and judgment normal.  Nursing note and vitals reviewed.   LABORATORY DATA: I have reviewed the data as listed. CBC    Component Value Date/Time   WBC 8.0 03/07/2016 0959   RBC 4.29 03/07/2016 0959   RBC 4.24 08/27/2014 0842   HGB 11.3* 03/07/2016 0959   HCT 34.6* 03/07/2016 0959   PLT 333 03/07/2016 0959   MCV 80.7 03/07/2016 0959   MCH 26.3 03/07/2016 0959   MCHC 32.7 03/07/2016 0959   RDW 15.0 03/07/2016 0959   LYMPHSABS 2.0 03/07/2016 0959   MONOABS 0.5 03/07/2016 0959   EOSABS 0.3 03/07/2016 0959   BASOSABS 0.1 03/07/2016 0959   CMP     Component Value Date/Time   NA 136 07/06/2015 0939   K 3.9 07/06/2015 0939   CL 103 07/06/2015 0939   CO2 26 07/06/2015 0939   GLUCOSE 223* 07/06/2015 0939   BUN 9 07/06/2015 0939   CREATININE 0.83 07/06/2015 0939   CREATININE 0.88 02/21/2012 1153   CALCIUM 9.4 07/06/2015 0939   PROT 6.6 07/06/2015 0939   ALBUMIN 3.9 07/06/2015 0939   AST 28 07/06/2015 0939   ALT 26 07/06/2015 0939   ALKPHOS 48 07/06/2015 0939   BILITOT 0.5 07/06/2015 0939   GFRNONAA >60 07/06/2015 0939   GFRAA >60 07/06/2015 0939   Results for JAMICE, RYKER (MRN [id number]) as of 03/29/2016 20:51  Ref. 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  I will have her meet with Angie today to discuss insurance benefits.   She has read about low immunoglobulin levels and myeloma. This concerns her. I advised her she has had peripheral studies for myeloma but will repeat today.   We will tentatively schedule her for 3 month follow-up. She knows to call sooner if her symptoms worsen.   Orders Placed This Encounter  Procedures  . Kappa/lambda light chains    Standing Status: Future     Number of Occurrences: 1     Standing Expiration Date: 03/29/2017  . Immunofixation electrophoresis    Standing Status: Future     Number of Occurrences: 1     Standing Expiration Date: 03/29/2017  . Protein electrophoresis, serum    Standing Status: Future     Number of Occurrences: 1     Standing Expiration Date: 03/29/2017    All questions were answered. The patient knows to call the clinic with any problems, questions or concerns. We can certainly see the patient much sooner if necessary.   This document serves as a record of services personally performed by Ancil Linsey, MD. It was created on her behalf by Toni Amend, a trained medical scribe. The creation of this record is based on the scribe's personal observations and the provider's statements to them. This document has been checked and approved by the attending provider.  I have reviewed the above documentation for accuracy and completeness, and I agree with the above.  This note was electronically signed.  Kelby Fam. Whitney Muse, MD

## 2016-03-30 LAB — PROTEIN ELECTROPHORESIS, SERUM
A/G RATIO SPE: 1.5 (ref 0.7–1.7)
ALBUMIN ELP: 4 g/dL (ref 2.9–4.4)
ALPHA-1-GLOBULIN: 0.2 g/dL (ref 0.0–0.4)
ALPHA-2-GLOBULIN: 0.7 g/dL (ref 0.4–1.0)
Beta Globulin: 1.1 g/dL (ref 0.7–1.3)
GLOBULIN, TOTAL: 2.7 g/dL (ref 2.2–3.9)
Gamma Globulin: 0.6 g/dL (ref 0.4–1.8)
Total Protein ELP: 6.7 g/dL (ref 6.0–8.5)

## 2016-03-30 LAB — IMMUNOFIXATION ELECTROPHORESIS
IgA: 56 mg/dL — ABNORMAL LOW (ref 87–352)
IgG (Immunoglobin G), Serum: 547 mg/dL — ABNORMAL LOW (ref 700–1600)
IgM, Serum: 28 mg/dL (ref 26–217)
TOTAL PROTEIN ELP: 6.5 g/dL (ref 6.0–8.5)

## 2016-03-30 LAB — KAPPA/LAMBDA LIGHT CHAINS
KAPPA FREE LGHT CHN: 9.53 mg/L (ref 3.30–19.40)
KAPPA, LAMDA LIGHT CHAIN RATIO: 1.34 (ref 0.26–1.65)
LAMDA FREE LIGHT CHAINS: 7.12 mg/L (ref 5.71–26.30)

## 2016-04-01 DIAGNOSIS — M5441 Lumbago with sciatica, right side: Secondary | ICD-10-CM | POA: Diagnosis not present

## 2016-04-01 DIAGNOSIS — M797 Fibromyalgia: Secondary | ICD-10-CM | POA: Diagnosis not present

## 2016-04-01 DIAGNOSIS — M9903 Segmental and somatic dysfunction of lumbar region: Secondary | ICD-10-CM | POA: Diagnosis not present

## 2016-04-01 DIAGNOSIS — S338XXA Sprain of other parts of lumbar spine and pelvis, initial encounter: Secondary | ICD-10-CM | POA: Diagnosis not present

## 2016-04-01 DIAGNOSIS — M47816 Spondylosis without myelopathy or radiculopathy, lumbar region: Secondary | ICD-10-CM | POA: Diagnosis not present

## 2016-04-01 DIAGNOSIS — M546 Pain in thoracic spine: Secondary | ICD-10-CM | POA: Diagnosis not present

## 2016-04-01 DIAGNOSIS — S134XXA Sprain of ligaments of cervical spine, initial encounter: Secondary | ICD-10-CM | POA: Diagnosis not present

## 2016-04-09 NOTE — Progress Notes (Signed)
This encounter was created in error - please disregard.

## 2016-04-11 DIAGNOSIS — S338XXA Sprain of other parts of lumbar spine and pelvis, initial encounter: Secondary | ICD-10-CM | POA: Diagnosis not present

## 2016-04-11 DIAGNOSIS — M797 Fibromyalgia: Secondary | ICD-10-CM | POA: Diagnosis not present

## 2016-04-11 DIAGNOSIS — S134XXA Sprain of ligaments of cervical spine, initial encounter: Secondary | ICD-10-CM | POA: Diagnosis not present

## 2016-04-11 DIAGNOSIS — M9903 Segmental and somatic dysfunction of lumbar region: Secondary | ICD-10-CM | POA: Diagnosis not present

## 2016-04-11 DIAGNOSIS — M47816 Spondylosis without myelopathy or radiculopathy, lumbar region: Secondary | ICD-10-CM | POA: Diagnosis not present

## 2016-04-11 DIAGNOSIS — M546 Pain in thoracic spine: Secondary | ICD-10-CM | POA: Diagnosis not present

## 2016-04-11 DIAGNOSIS — M5441 Lumbago with sciatica, right side: Secondary | ICD-10-CM | POA: Diagnosis not present

## 2016-04-14 DIAGNOSIS — M47816 Spondylosis without myelopathy or radiculopathy, lumbar region: Secondary | ICD-10-CM | POA: Diagnosis not present

## 2016-04-14 DIAGNOSIS — M9903 Segmental and somatic dysfunction of lumbar region: Secondary | ICD-10-CM | POA: Diagnosis not present

## 2016-04-14 DIAGNOSIS — M5441 Lumbago with sciatica, right side: Secondary | ICD-10-CM | POA: Diagnosis not present

## 2016-04-14 DIAGNOSIS — M546 Pain in thoracic spine: Secondary | ICD-10-CM | POA: Diagnosis not present

## 2016-04-14 DIAGNOSIS — S338XXA Sprain of other parts of lumbar spine and pelvis, initial encounter: Secondary | ICD-10-CM | POA: Diagnosis not present

## 2016-04-14 DIAGNOSIS — M797 Fibromyalgia: Secondary | ICD-10-CM | POA: Diagnosis not present

## 2016-04-14 DIAGNOSIS — S134XXA Sprain of ligaments of cervical spine, initial encounter: Secondary | ICD-10-CM | POA: Diagnosis not present

## 2016-04-18 DIAGNOSIS — M9903 Segmental and somatic dysfunction of lumbar region: Secondary | ICD-10-CM | POA: Diagnosis not present

## 2016-04-18 DIAGNOSIS — S338XXA Sprain of other parts of lumbar spine and pelvis, initial encounter: Secondary | ICD-10-CM | POA: Diagnosis not present

## 2016-04-18 DIAGNOSIS — M546 Pain in thoracic spine: Secondary | ICD-10-CM | POA: Diagnosis not present

## 2016-04-18 DIAGNOSIS — S134XXA Sprain of ligaments of cervical spine, initial encounter: Secondary | ICD-10-CM | POA: Diagnosis not present

## 2016-04-18 DIAGNOSIS — M797 Fibromyalgia: Secondary | ICD-10-CM | POA: Diagnosis not present

## 2016-04-18 DIAGNOSIS — M47816 Spondylosis without myelopathy or radiculopathy, lumbar region: Secondary | ICD-10-CM | POA: Diagnosis not present

## 2016-04-18 DIAGNOSIS — M5441 Lumbago with sciatica, right side: Secondary | ICD-10-CM | POA: Diagnosis not present

## 2016-04-22 ENCOUNTER — Telehealth (HOSPITAL_COMMUNITY): Payer: Self-pay | Admitting: *Deleted

## 2016-04-22 NOTE — Telephone Encounter (Signed)
phone call from patient, she need refill for clonazePAM.  Also wants to increase dosage for Risperidone.   She uses [facility].

## 2016-04-22 NOTE — Telephone Encounter (Signed)
lmtcb

## 2016-04-22 NOTE — Telephone Encounter (Signed)
Spoke with pt and she stated that she did not realize that her Clonazepam still had refills. But pt would like to know if she could increase her dose on her Risperidone. Per pt, the dose she is on is not working. Informed pt that provider may need her to come in to change dose but message will be sent. Per pt, if that's the case, she will just wait until her f/u appt with provider but to still send the message.

## 2016-04-23 NOTE — Telephone Encounter (Signed)
She will need to come in 

## 2016-04-25 DIAGNOSIS — M5416 Radiculopathy, lumbar region: Secondary | ICD-10-CM | POA: Diagnosis not present

## 2016-04-25 DIAGNOSIS — Z6833 Body mass index (BMI) 33.0-33.9, adult: Secondary | ICD-10-CM | POA: Diagnosis not present

## 2016-04-25 DIAGNOSIS — M5412 Radiculopathy, cervical region: Secondary | ICD-10-CM | POA: Diagnosis not present

## 2016-04-26 NOTE — Telephone Encounter (Signed)
Spoke with pt and informed her of what provider stated and pt stated she will just see provider the day she is already scheduled to see her and just continue to take her medication like it is now.

## 2016-04-28 DIAGNOSIS — J060 Acute laryngopharyngitis: Secondary | ICD-10-CM | POA: Diagnosis not present

## 2016-05-06 DIAGNOSIS — M5412 Radiculopathy, cervical region: Secondary | ICD-10-CM | POA: Diagnosis not present

## 2016-05-06 DIAGNOSIS — M5416 Radiculopathy, lumbar region: Secondary | ICD-10-CM | POA: Diagnosis not present

## 2016-05-06 DIAGNOSIS — M50322 Other cervical disc degeneration at C5-C6 level: Secondary | ICD-10-CM | POA: Diagnosis not present

## 2016-05-06 DIAGNOSIS — M5126 Other intervertebral disc displacement, lumbar region: Secondary | ICD-10-CM | POA: Diagnosis not present

## 2016-05-19 ENCOUNTER — Ambulatory Visit (INDEPENDENT_AMBULATORY_CARE_PROVIDER_SITE_OTHER): Payer: PPO | Admitting: Psychiatry

## 2016-05-19 ENCOUNTER — Encounter (HOSPITAL_COMMUNITY): Payer: Self-pay | Admitting: Psychiatry

## 2016-05-19 VITALS — BP 100/60 | Ht 71.0 in | Wt 235.0 lb

## 2016-05-19 DIAGNOSIS — F332 Major depressive disorder, recurrent severe without psychotic features: Secondary | ICD-10-CM

## 2016-05-19 MED ORDER — DULOXETINE HCL 30 MG PO CPEP: 30.0000 mg | ORAL_CAPSULE | Freq: Two times a day (BID) | ORAL | Status: DC

## 2016-05-19 MED ORDER — RISPERIDONE 1 MG PO TABS: 1.0000 mg | ORAL_TABLET | Freq: Every day | ORAL | Status: DC

## 2016-05-19 MED ORDER — CLONAZEPAM 0.5 MG PO TABS: 0.5000 mg | ORAL_TABLET | Freq: Two times a day (BID) | ORAL | Status: DC | PRN
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
## 2016-05-20 DIAGNOSIS — G894 Chronic pain syndrome: Secondary | ICD-10-CM | POA: Diagnosis not present

## 2016-05-26 ENCOUNTER — Telehealth (HOSPITAL_COMMUNITY): Payer: Self-pay | Admitting: *Deleted

## 2016-05-26 NOTE — Telephone Encounter (Signed)
Called Lattie Haw with [facility] due to previous phone call. lmtcb and number provided

## 2016-05-26 NOTE — Telephone Encounter (Signed)
voice message from mail order pharmacy, need phone call regarding risperiDONE.

## 2016-05-26 NOTE — Telephone Encounter (Signed)
Opened in Error.

## 2016-05-26 NOTE — Telephone Encounter (Signed)
Lattie Haw with [facility] called office and lm returning previous call from office. Office returning Bertha call at 2:12pm and lmtcb and number provided.

## 2016-05-27 ENCOUNTER — Telehealth (HOSPITAL_COMMUNITY): Payer: Self-pay | Admitting: *Deleted

## 2016-05-27 NOTE — Telephone Encounter (Signed)
Called Mayer Camel with [facility] due to previous calls. Per Lattie Haw, they received a dose increase for pt Risperidone and just wanted to confirm this. Lattie Haw read the script that came to them on 05-19-16. Went in pt chart and read note that stated for pt to increase dose of this medication to 1 mg QHS and Lisa verbalized understanding.

## 2016-05-27 NOTE — Telephone Encounter (Signed)
Spoke with Michelle Clements

## 2016-05-27 NOTE — Telephone Encounter (Signed)
lmtcb for Lattie Haw due to previous phone call. Number provided.

## 2016-05-27 NOTE — Telephone Encounter (Signed)
Octavia please call [facility] mail order.  they left voice message regarding patient's medication increase.

## 2016-05-27 NOTE — Telephone Encounter (Signed)
lmtcb for Michelle Clements due to previous phone call. Number provided.

## 2016-05-28 IMAGING — CT CT NECK W/ CM
3 of 4 series · 12 of 33 positions shown, 14 images · IV contrast (Omnipaque 300)
Comparison: MRI of the cervical spine 03/06/2014. CT myelogram of
the cervical spine 06/05/2012.

CLINICAL DATA: Common variable immunodeficiency.  Suspect lymphoma.

EXAM:
CT NECK WITH CONTRAST
TECHNIQUE: Multidetector CT imaging of the neck was performed using the
standard protocol following the bolus administration of intravenous
contrast.
CONTRAST:  150mL OMNIPAQUE IOHEXOL 300 MG/ML  SOLN

[Series 10: soft tissue neck 2.0 b31s · axial · 0.35mm/px · z∈[+632,+810]mm · 4 of 135 slices shown, 5 images]
[im 23/135  soft-tissue]
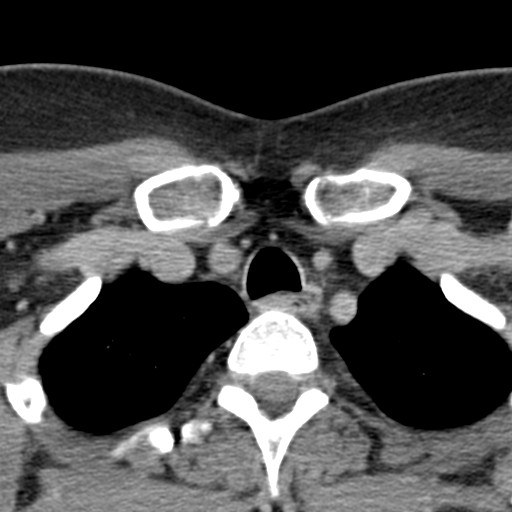
[im 23/135  bone]
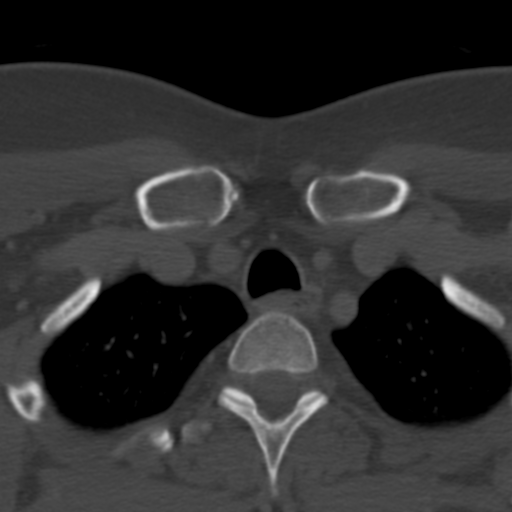
[im 45/135  bone]
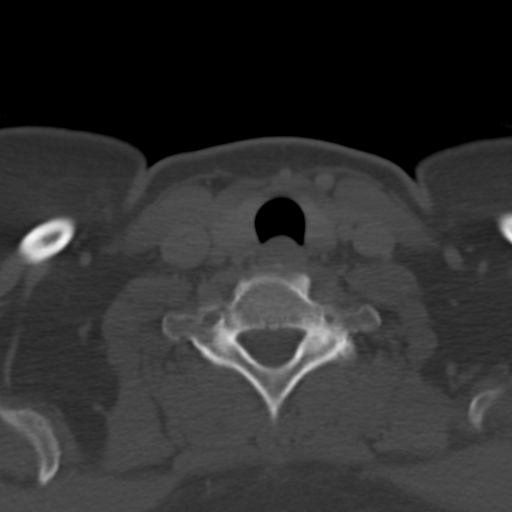
[im 90/135  bone]
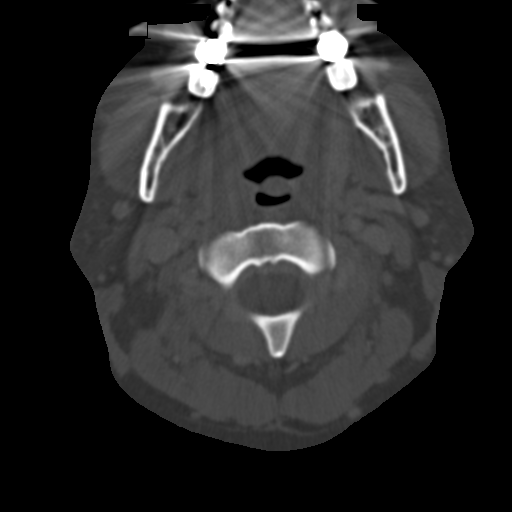
[im 112/135  bone]
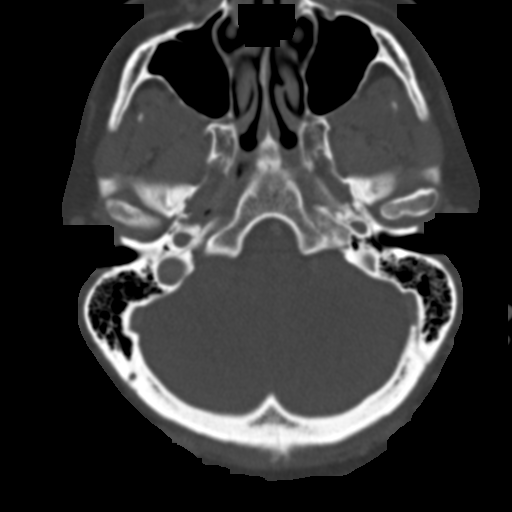

[Series 12: neck 2.0 soft tissue sag · sagittal · 0.37mm/px · 5 of 84 slices shown, 6 images]
[im 28/84  bone]
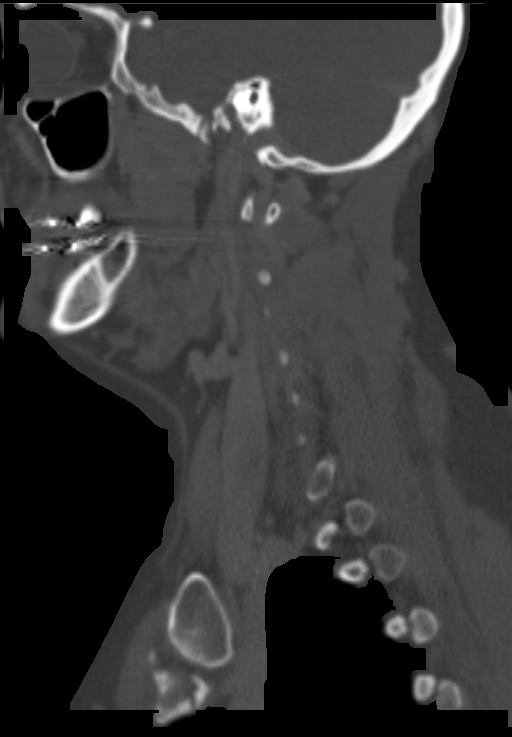
[im 35/84  bone]
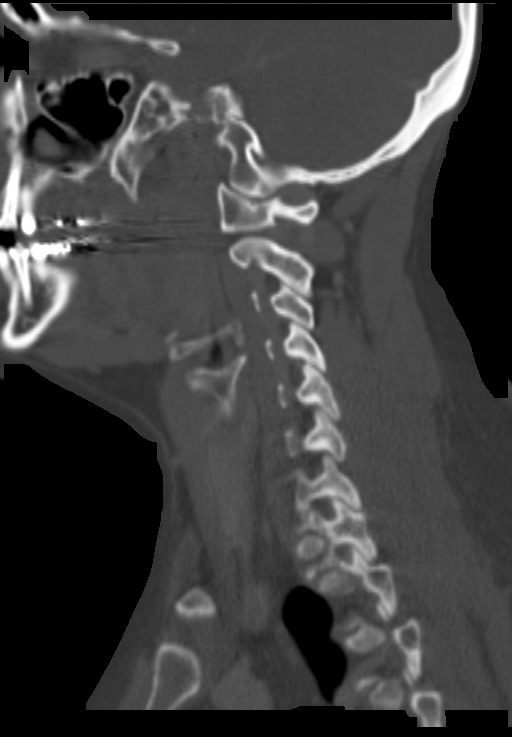
[im 42/84  soft-tissue]
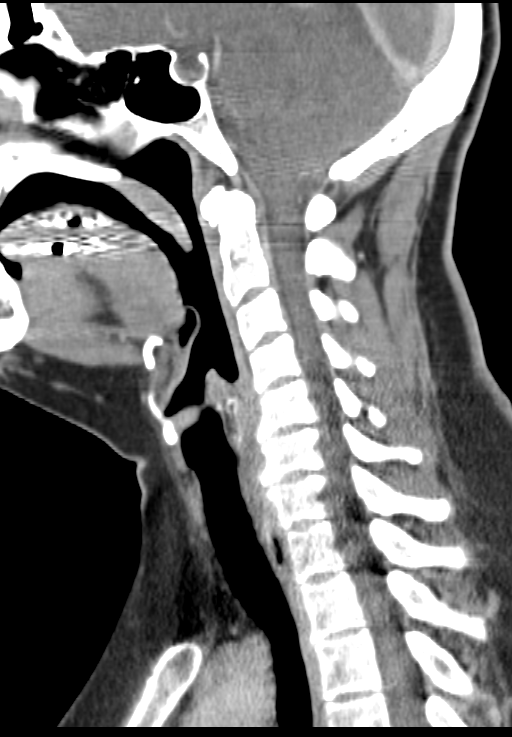
[im 42/84  bone]
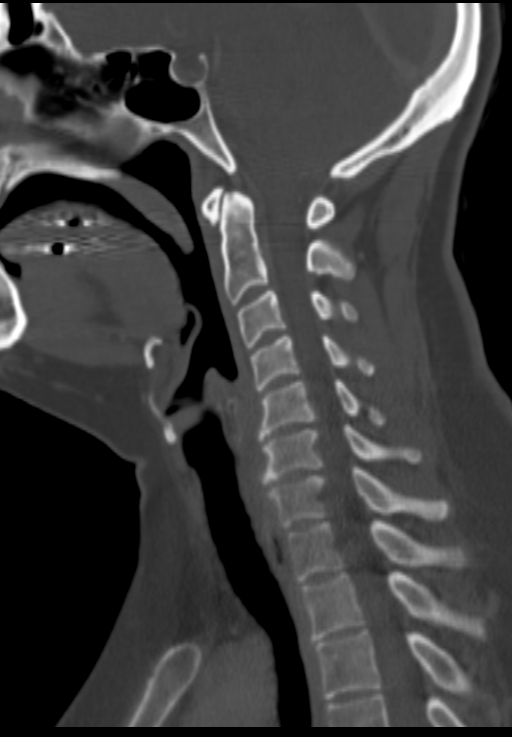
[im 49/84  bone]
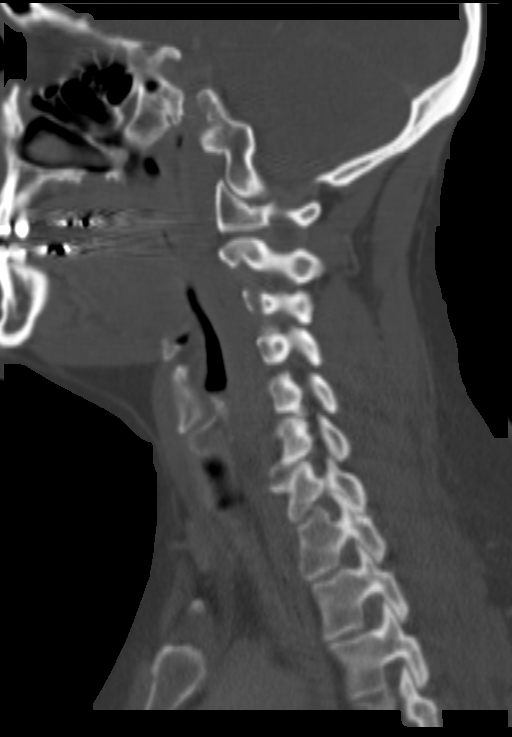
[im 56/84  bone]
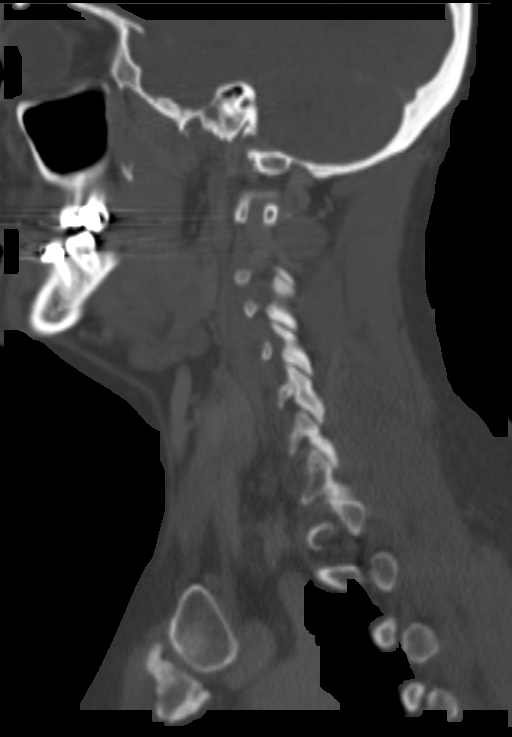

[Series 13: neck 2.0 soft tissue coro · coronal · 0.35mm/px · 3 of 97 slices shown]
[im 20/97  bone]
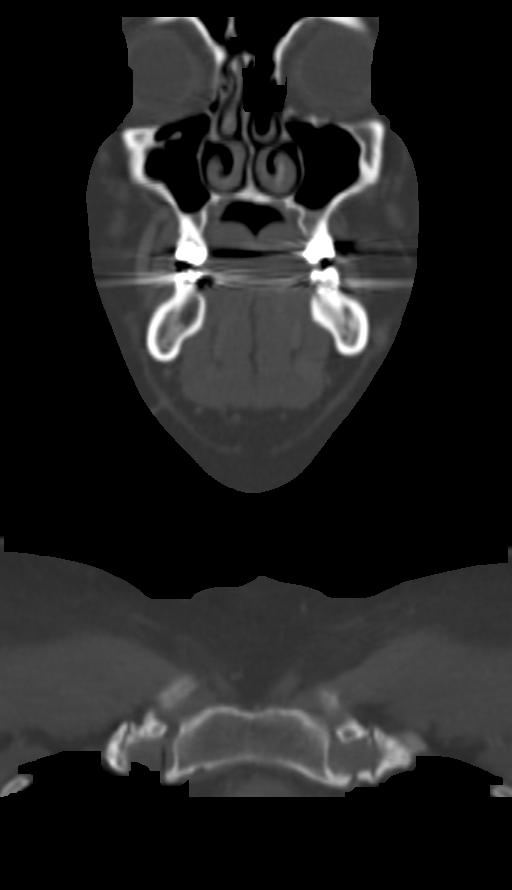
[im 39/97  bone]
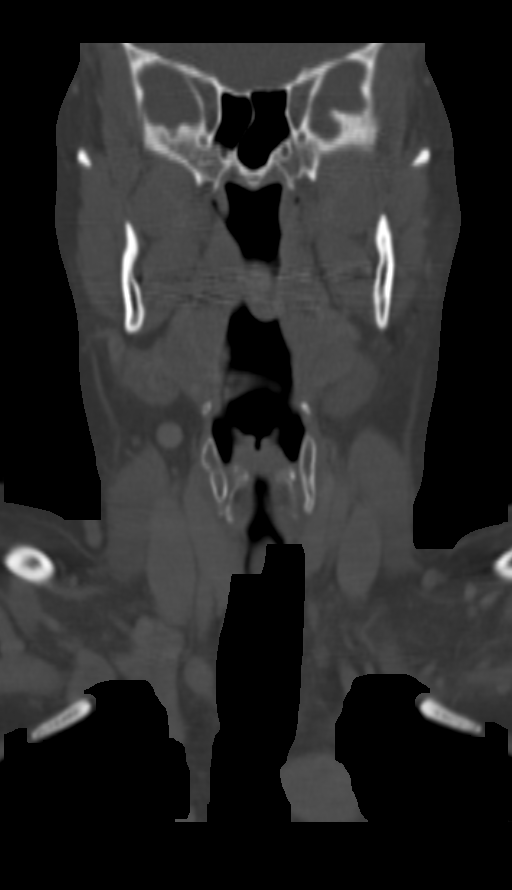
[im 58/97  bone]
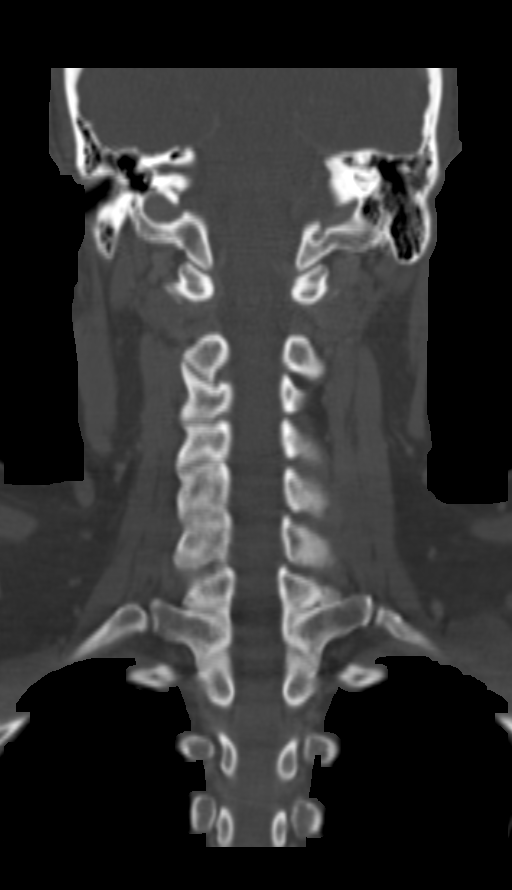

[12 of 33 positions shown; findings below may reference images not displayed]

FINDINGS: Limited imaging of the brain is unremarkable.

Suprahyoid neck demonstrates no focal mucosal or submucosal lesion.
The vallecula is clear. The epiglottis is within normal limits.

The larynx is unremarkable. Vocal cords are midline and symmetric.
No focal mucosal or submucosal lesions are present.

The infraglottic neck demonstrates normal appearance of the thyroid.
The thoracic inlet is within normal limits. Superior mediastinum is
unremarkable.

Benign appearing level 2 lymph nodes are present bilaterally. There
are no pathologically enlarged nodes for nodes suspicious for
lymphoma.

Endplate degenerative changes are most evident at C5-6 and C6-7.
Uncovertebral spurring is present bilaterally at C5-6. There
straightening of the normal cervical lordosis.

The lung apices are clear.
IMPRESSION: 1. No significant cervical adenopathy to suggest lymphoma.
2. No primary mucosal or submucosal lesion.
3. Mild degenerative changes of the cervical spine are similar to
prior studies, most evident at C5-6 and C6-7.

## 2016-05-28 IMAGING — CT CT CHEST W/ CM
2 of 6 series · 14 of 36 positions shown, 17 images · IV contrast (Omnipaque 300)
Comparison: CT abdomen and pelvis 01/11/2013.

CONTRAST:  150mL OMNIPAQUE IOHEXOL 300 MG/ML  SOLN

:
CLINICAL DATA: Unintentional weight loss. Suspect lymphoma. Chest pain, hoarseness,
loss of voice. Difficulty swallowing. Shortness of Breath.

EXAM:
CT CHEST, ABDOMEN, AND PELVIS WITH CONTRAST
TECHNIQUE: Multidetector CT imaging of the chest, abdomen and pelvis was
performed following the standard protocol during bolus
administration of intravenous contrast.

[Series 3: cap with 5.0 b40f · axial · 0.84mm/px · z∈[+21,+621]mm · 11 of 140 slices shown, 14 images]
[im 10/140  mediastinal]
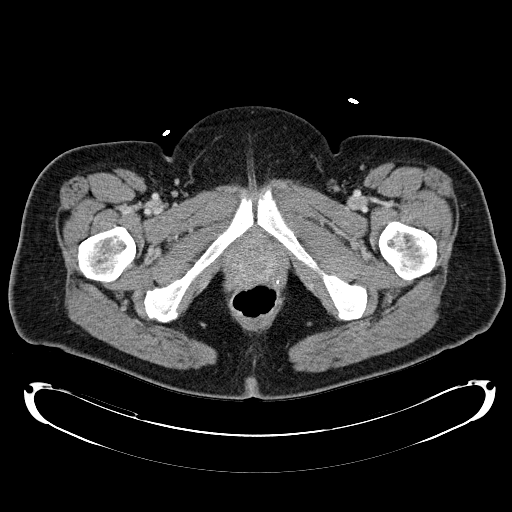
[im 10/140  lung]
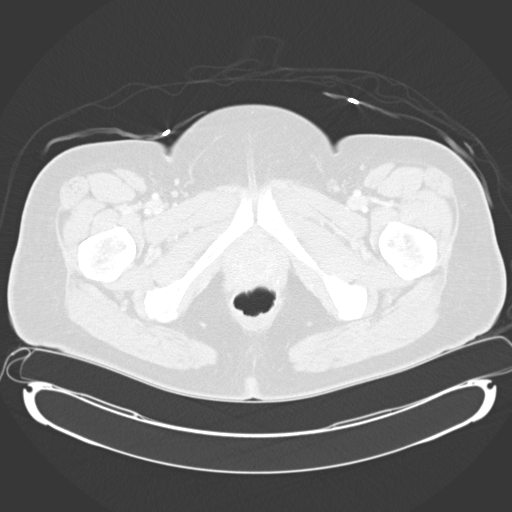
[im 20/140  lung]
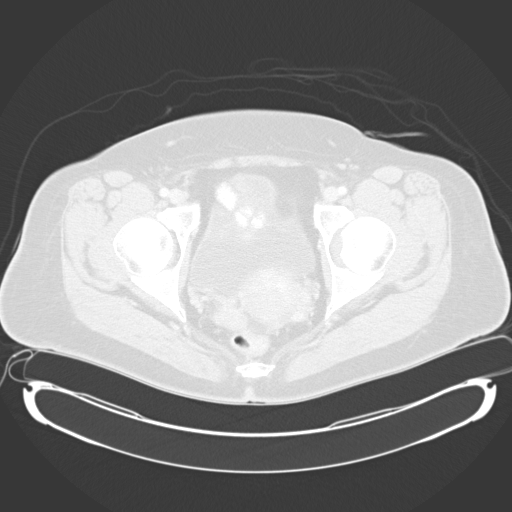
[im 30/140  lung]
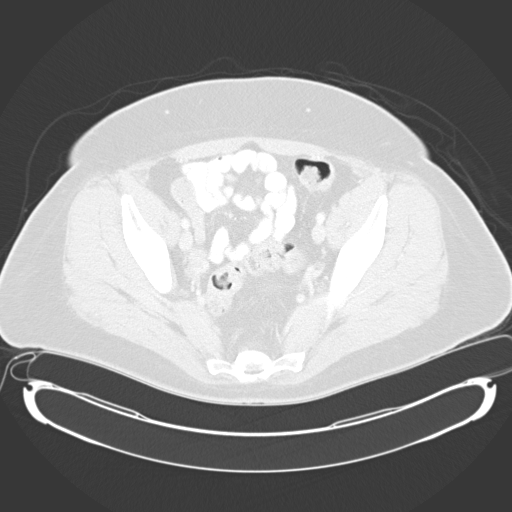
[im 50/140  lung]
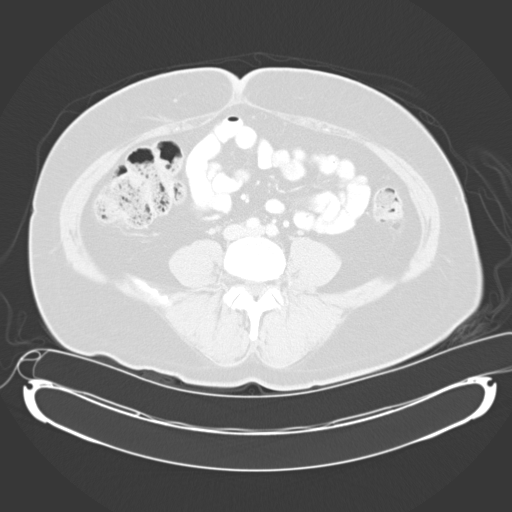
[im 60/140  mediastinal]
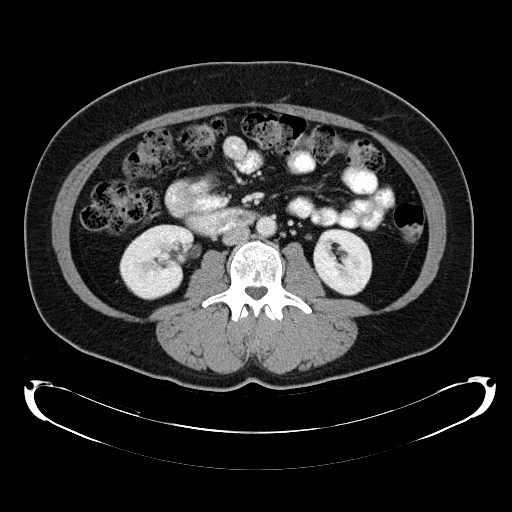
[im 60/140  lung]
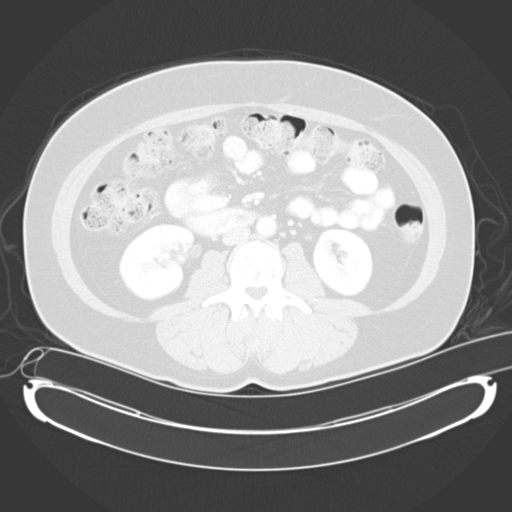
[im 70/140  lung]
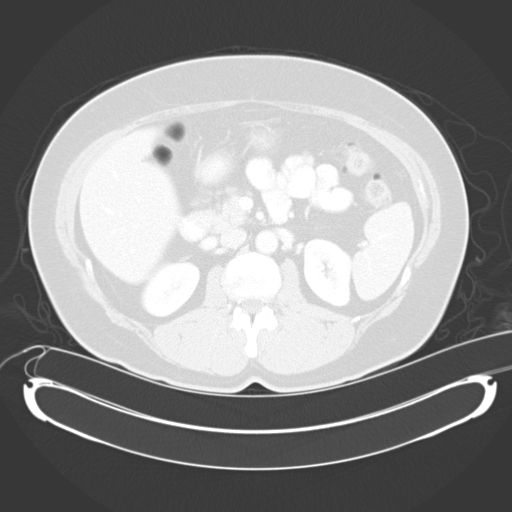
[im 80/140  lung]
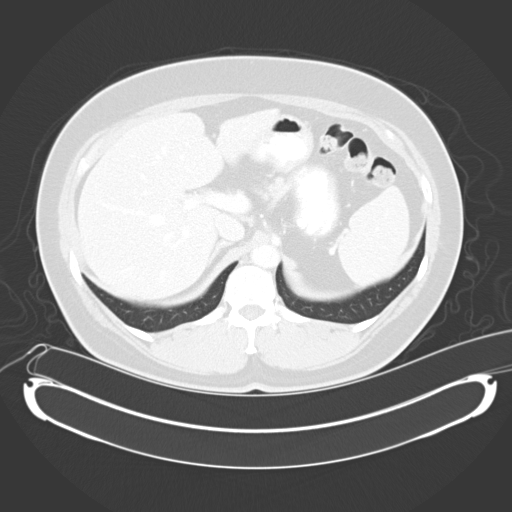
[im 90/140  lung]
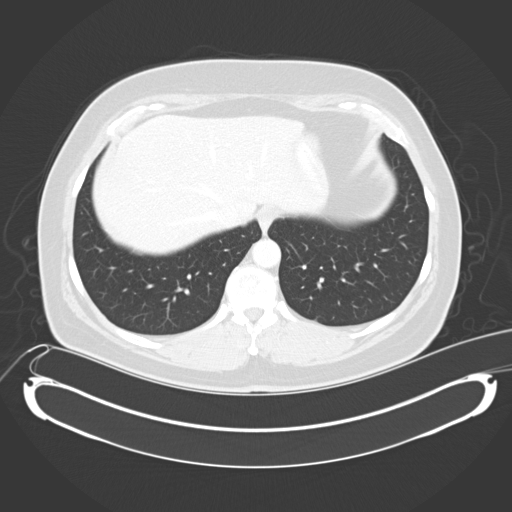
[im 110/140  mediastinal]
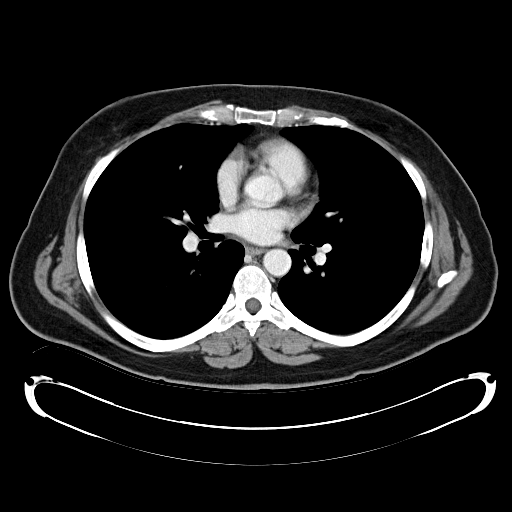
[im 110/140  lung]
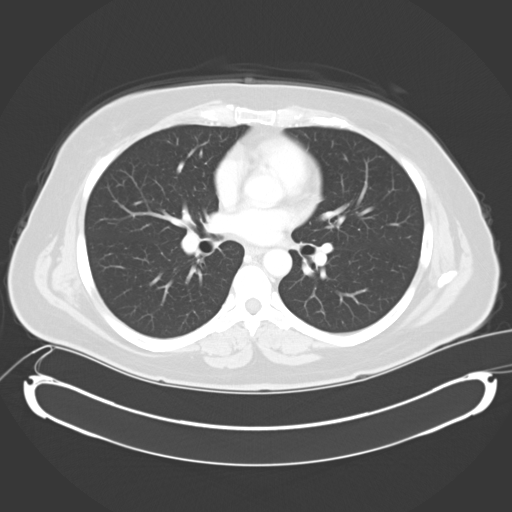
[im 120/140  lung]
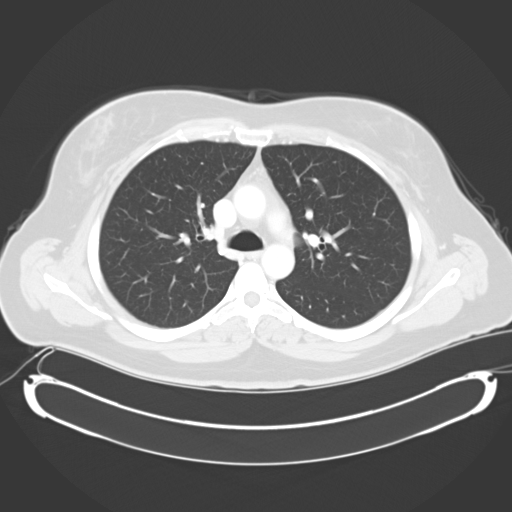
[im 130/140  lung]
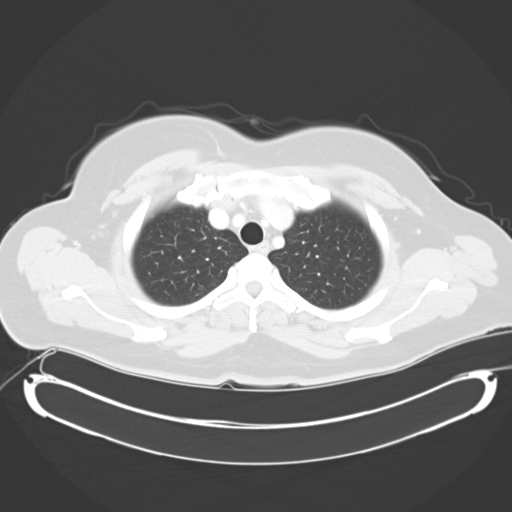

[Series 5: mpr cor post contrast (id) · coronal · 0.84mm/px · 3 of 90 slices shown]
[im 18/90  lung]
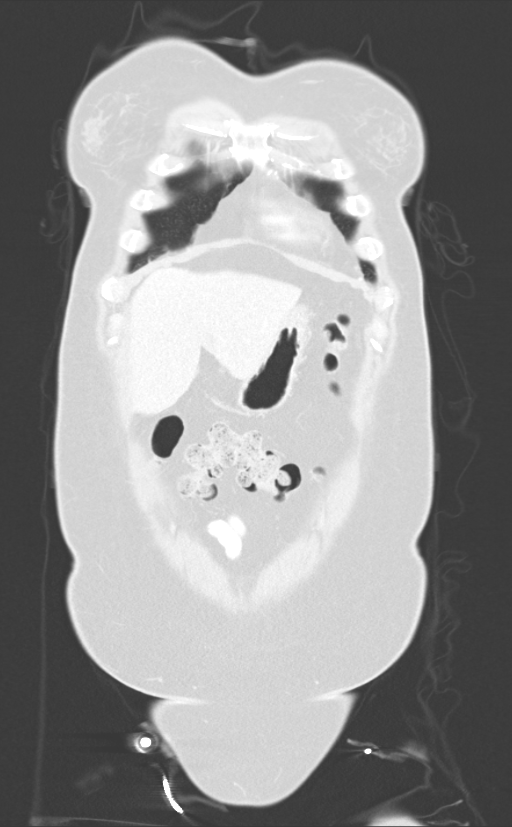
[im 36/90  lung]
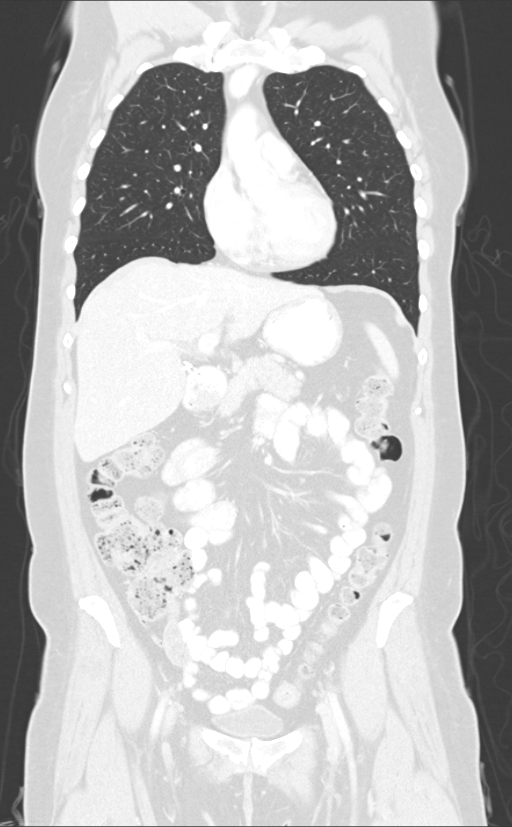
[im 54/90  lung]
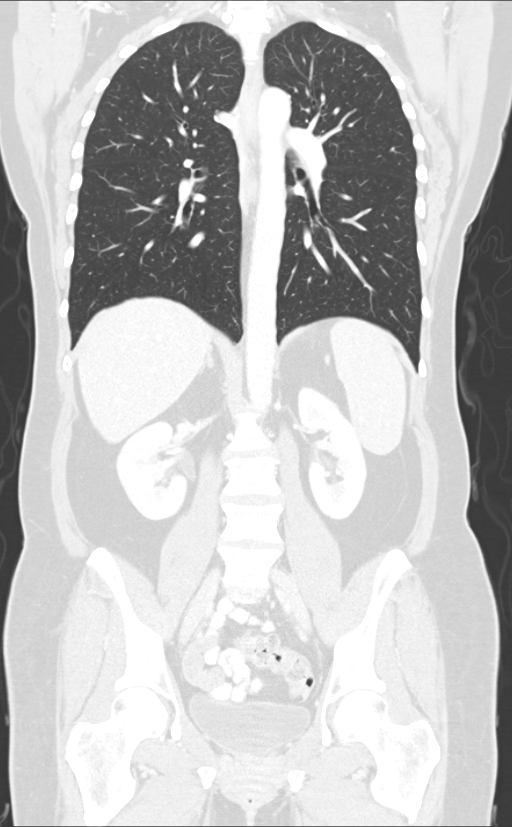

[14 of 36 positions shown; findings below may reference images not displayed]

FINDINGS: CT CHEST FINDINGS

Lungs are clear. No focal airspace opacities or suspicious nodules.
No effusions. Heart is normal size. Aorta is normal caliber. No
mediastinal, hilar, or axillary adenopathy. Chest wall soft tissues
are unremarkable.

CT ABDOMEN AND PELVIS FINDINGS

Prior cholecystectomy. Liver, spleen, pancreas, adrenals and kidneys
are unremarkable.

Stomach, large and small bowel unremarkable. Uterus, adnexae and
urinary bladder normal. No free fluid, free air or adenopathy. Aorta
is normal caliber.

No acute bony abnormality or focal bone lesion.
IMPRESSION: No acute findings or significant abnormality in the chest, abdomen
or pelvis.

## 2016-05-30 ENCOUNTER — Telehealth (HOSPITAL_COMMUNITY): Payer: Self-pay | Admitting: *Deleted

## 2016-05-30 NOTE — Telephone Encounter (Signed)
Called pt due to previous call. Unable to leave message due to pt not having voicemail.

## 2016-05-30 NOTE — Telephone Encounter (Signed)
returned phone call, voice message to [facility] regarding patient's medications.

## 2016-05-31 DIAGNOSIS — M4722 Other spondylosis with radiculopathy, cervical region: Secondary | ICD-10-CM | POA: Insufficient documentation

## 2016-05-31 DIAGNOSIS — Z6833 Body mass index (BMI) 33.0-33.9, adult: Secondary | ICD-10-CM | POA: Diagnosis not present

## 2016-05-31 DIAGNOSIS — M4726 Other spondylosis with radiculopathy, lumbar region: Secondary | ICD-10-CM | POA: Diagnosis not present

## 2016-05-31 NOTE — Telephone Encounter (Signed)
Called pt to see if her pharmacy had sent her medication they had to verify dose for. Per pt, they called her yesterday to tell her they was going to be mailing it out to her.

## 2016-06-14 ENCOUNTER — Other Ambulatory Visit (HOSPITAL_COMMUNITY): Payer: Self-pay

## 2016-06-14 ENCOUNTER — Ambulatory Visit (HOSPITAL_COMMUNITY): Payer: Self-pay | Admitting: Hematology & Oncology

## 2016-06-21 DIAGNOSIS — N390 Urinary tract infection, site not specified: Secondary | ICD-10-CM | POA: Diagnosis not present

## 2016-06-21 DIAGNOSIS — E119 Type 2 diabetes mellitus without complications: Secondary | ICD-10-CM | POA: Diagnosis not present

## 2016-06-21 DIAGNOSIS — G589 Mononeuropathy, unspecified: Secondary | ICD-10-CM | POA: Diagnosis not present

## 2016-06-28 ENCOUNTER — Encounter (HOSPITAL_COMMUNITY): Payer: PPO

## 2016-06-28 ENCOUNTER — Encounter (HOSPITAL_COMMUNITY): Payer: Self-pay | Admitting: Hematology & Oncology

## 2016-06-28 ENCOUNTER — Encounter (HOSPITAL_COMMUNITY): Payer: PPO | Attending: Hematology & Oncology | Admitting: Hematology & Oncology

## 2016-06-28 VITALS — BP 125/71 | HR 80 | Temp 98.4°F | Resp 18 | Wt 238.5 lb

## 2016-06-28 DIAGNOSIS — K589 Irritable bowel syndrome without diarrhea: Secondary | ICD-10-CM | POA: Diagnosis not present

## 2016-06-28 DIAGNOSIS — Z87891 Personal history of nicotine dependence: Secondary | ICD-10-CM | POA: Diagnosis not present

## 2016-06-28 DIAGNOSIS — I10 Essential (primary) hypertension: Secondary | ICD-10-CM | POA: Insufficient documentation

## 2016-06-28 DIAGNOSIS — D838 Other common variable immunodeficiencies: Secondary | ICD-10-CM | POA: Diagnosis not present

## 2016-06-28 DIAGNOSIS — E782 Mixed hyperlipidemia: Secondary | ICD-10-CM | POA: Insufficient documentation

## 2016-06-28 DIAGNOSIS — K5900 Constipation, unspecified: Secondary | ICD-10-CM

## 2016-06-28 DIAGNOSIS — Z9851 Tubal ligation status: Secondary | ICD-10-CM | POA: Diagnosis not present

## 2016-06-28 DIAGNOSIS — Z8249 Family history of ischemic heart disease and other diseases of the circulatory system: Secondary | ICD-10-CM | POA: Insufficient documentation

## 2016-06-28 DIAGNOSIS — E559 Vitamin D deficiency, unspecified: Secondary | ICD-10-CM | POA: Diagnosis not present

## 2016-06-28 DIAGNOSIS — Z9049 Acquired absence of other specified parts of digestive tract: Secondary | ICD-10-CM | POA: Diagnosis not present

## 2016-06-28 DIAGNOSIS — Z823 Family history of stroke: Secondary | ICD-10-CM | POA: Diagnosis not present

## 2016-06-28 DIAGNOSIS — D839 Common variable immunodeficiency, unspecified: Secondary | ICD-10-CM

## 2016-06-28 DIAGNOSIS — M797 Fibromyalgia: Secondary | ICD-10-CM | POA: Diagnosis not present

## 2016-06-28 DIAGNOSIS — Z8673 Personal history of transient ischemic attack (TIA), and cerebral infarction without residual deficits: Secondary | ICD-10-CM | POA: Insufficient documentation

## 2016-06-28 DIAGNOSIS — F319 Bipolar disorder, unspecified: Secondary | ICD-10-CM | POA: Insufficient documentation

## 2016-06-28 DIAGNOSIS — G629 Polyneuropathy, unspecified: Secondary | ICD-10-CM | POA: Insufficient documentation

## 2016-06-28 DIAGNOSIS — Z9889 Other specified postprocedural states: Secondary | ICD-10-CM | POA: Diagnosis not present

## 2016-06-28 LAB — CBC WITH DIFFERENTIAL/PLATELET
BASOS ABS: 0.1 10*3/uL (ref 0.0–0.1)
BASOS PCT: 1 %
EOS ABS: 0.3 10*3/uL (ref 0.0–0.7)
EOS PCT: 4 %
HCT: 39.7 % (ref 36.0–46.0)
Hemoglobin: 14.3 g/dL (ref 12.0–15.0)
Lymphocytes Relative: 33 %
Lymphs Abs: 2.3 10*3/uL (ref 0.7–4.0)
MCH: 32.1 pg (ref 26.0–34.0)
MCHC: 36 g/dL (ref 30.0–36.0)
MCV: 89 fL (ref 78.0–100.0)
Monocytes Absolute: 0.4 10*3/uL (ref 0.1–1.0)
Monocytes Relative: 6 %
NEUTROS PCT: 56 %
Neutro Abs: 4 10*3/uL (ref 1.7–7.7)
PLATELETS: 270 10*3/uL (ref 150–400)
RBC: 4.46 MIL/uL (ref 3.87–5.11)
RDW: 14.4 % (ref 11.5–15.5)
WBC: 7 10*3/uL (ref 4.0–10.5)

## 2016-06-28 LAB — COMPREHENSIVE METABOLIC PANEL
ALBUMIN: 4.4 g/dL (ref 3.5–5.0)
ALT: 41 U/L (ref 14–54)
AST: 30 U/L (ref 15–41)
Alkaline Phosphatase: 47 U/L (ref 38–126)
Anion gap: 6 (ref 5–15)
BUN: 11 mg/dL (ref 6–20)
CHLORIDE: 103 mmol/L (ref 101–111)
CO2: 27 mmol/L (ref 22–32)
CREATININE: 0.87 mg/dL (ref 0.44–1.00)
Calcium: 9.4 mg/dL (ref 8.9–10.3)
GFR calc non Af Amer: >60 mL/min (ref >60)
GLUCOSE: 144 mg/dL — AB (ref 65–99)
Potassium: 4.5 mmol/L (ref 3.5–5.1)
SODIUM: 136 mmol/L (ref 135–145)
Total Bilirubin: 0.5 mg/dL (ref 0.3–1.2)
Total Protein: 6.8 g/dL (ref 6.5–8.1)

## 2016-06-28 NOTE — Patient Instructions (Signed)
[place] at [facility] Discharge Instructions  RECOMMENDATIONS MADE BY THE CONSULTANT AND ANY TEST RESULTS WILL BE SENT TO YOUR REFERRING PHYSICIAN.  You were seen by Dr. Whitney Muse today. Return to Clinic in 4 months with labs. Call center with any related concerns.   Thank you for choosing [place] at [facility] to provide your oncology and hematology care.  To afford each patient quality time with our provider, please arrive at least 15 minutes before your scheduled appointment time.   Beginning January 23rd 2017 lab work for the [facility] will be done in the  Main lab at [facility] on 1st floor. If you have a lab appointment with the [facility] please come in thru the  Main Entrance and check in at the main information desk  You need to re-schedule your appointment should you arrive 10 or more minutes late.  We strive to give you quality time with our providers, and arriving late affects you and other patients whose appointments are after yours.  Also, if you no show three or more times for appointments you may be dismissed from the clinic at the providers discretion.     Again, thank you for choosing [facility].  Our hope is that these requests will decrease the amount of time that you wait before being seen by our physicians.       _____________________________________________________________  Should you have questions after your visit to [facility], please contact our office at [phone] between the hours of 8:30 a.m. and 4:30 p.m.  Voicemails left after 4:30 p.m. will not be returned until the following business day.  For prescription refill requests, have your pharmacy contact our office.         Resources For Cancer Patients and their Caregivers ? American Cancer Society: Can assist with transportation, wigs, general needs, runs Look Good Feel Better.        215-701-8849 ? Cancer  Care: Provides financial assistance, online support groups, medication/co-pay assistance.  1-800-813-HOPE (949) 331-8787) ? [place] Assists [place] cancer patients and their families through emotional , educational and financial support.  (915)687-9299 ? [place] DSS Where to apply for food stamps, Medicaid and utility assistance. (386)617-3173 ? [facility]: Transportation to medical appointments. 2061886944 ? Social Security Administration: May apply for disability if have a Stage IV cancer. 707-759-4921 252-374-1712 ? [employer], Disability and Transit Services: Assists with nutrition, care and transit needs. [place] Support Programs: @10RELATIVEDAYS @ > Cancer Support Group  2nd Tuesday of the month 1pm-2pm, Journey Room  > Creative Journey  3rd Tuesday of the month 1130am-1pm, Journey Room  > Look Good Feel Better  1st Wednesday of the month 10am-12 noon, Journey Room (Call [place] to register [phone])

## 2016-06-28 NOTE — Progress Notes (Signed)
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 female returns for follow-up of her common variable immunodeficiency.    [name] is unaccompanied. I personally reviewed and went over laboratory studies with the patient.  She recently underwent an MRI of the cervical spine where she was told things were "normal for her". She is being set up for injections, rather than surgery.  She had a urinary tract infection since out last visit, but no respiratory infections.  Reports having a runny nose.  She has allergies and takes Allergra, as well as Flonase. She has only taken the Allegra since the beginning of the summer. No fever.   If she takes a deep breath she has problems with that, as well as taking a deep breath while laying down. She is not sure if this is because of her costochondritis.  She has been experiencing joint pains, headaches, and problems with her vision. Some of these issues she notes are chronic for her.    The patient is worried if she has Sjogren's disease because of dry eyes. She sees Dr. Nevada Crane for her primary care needs.  Appetite is good. Energy is good overall.    MEDICAL HISTORY: Past Medical History:  Diagnosis Date  . Allergic rhinitis   . Anemia   . Arthritis    Lower back and hips  . Asthma   . Bipolar disorder ([place])   . Compulsive behavior disorder   . Constipation   . 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[place])   . Depression   . Essential hypertension, benign   . Fibromyalgia   . GERD (gastroesophageal reflux disease)   . H/O sleep apnea   . Hip pain, left   . History of palpitations    Negative Holter monitor  . History of pneumonia 1988  . Hyperlipidemia   . IBS (irritable bowel syndrome)   . Neck  pain   . Poor short term memory   . PTSD (post-traumatic stress disorder)   . S/P Botox injection 11/2014    For migraine headaches  . Type 2 diabetes mellitus ([place])   . Urgency of urination     has Unspecified vitamin D deficiency; Mixed hyperlipidemia; Morbid obesity ([place]); BIPOLAR AFFECTIVE DISORDER; Hx of tobacco use, presenting hazards to health; DEPRESSION; Essential hypertension, benign; IBS; FIBROMYALGIA; Palpitations; Neuropathy of upper extremity; History of stroke in prior 3 months; Headache(784.0); Cervicalgia; Acute confusional state; 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[place]); Migraine; and Neck pain of over 3 months duration on her problem list.     No history exists.     is allergic to sulfonamide derivatives; gluten meal; molds & smuts; clonazepam; dust mite extract; and tegretol [carbamazepine].  [name] had no medications administered during this visit.  SURGICAL HISTORY: Past Surgical History:  Procedure Laterality Date  . CARPAL TUNNEL RELEASE Right 06/10/2013   Procedure: CARPAL TUNNEL RELEASE;  Surgeon: Faythe Ghee, MD;  Location: [place] [facility];  Service: Neurosurgery;  Laterality: Right;  Right Carpal Tunnel Release   . CHOLECYSTECTOMY    . DENTAL SURGERY  12/2014  . mole exc     x 3  . MUSCLE BIOPSY  2008   Right leg  . TUBAL LIGATION      SOCIAL HISTORY: Social History   Social History  . Marital  status: Married    Spouse name: N/A  . Number of children: 2  . Years of education: College   Occupational History  . Umemployed Unemployed    Now disabled due to bipolar and fibromyalgia   Social History Main Topics  . Smoking status: Former Smoker    Types: Cigarettes    Quit date: 11/04/2010  . Smokeless tobacco: Never Used  . Alcohol use 0.0 oz/week     Comment: one mixed drink per week  . Drug use: No  . Sexual activity: Yes    Birth control/ protection: Surgical   Other Topics Concern  . Not on file   Social History Narrative   Married     Lives with spouse and daughter   Right handed.   Caffeine use: 2 cups coffee in morning and 2 cups tea during the day     FAMILY HISTORY: Family History  Problem Relation Age of Onset  . Fibromyalgia Mother   . Diabetes type II Mother   . Depression Mother   . Alzheimer's disease Mother   . GER disease Mother   . Heart disease Mother   . Paranoid behavior Mother   . Dementia Mother   . Heart attack Father 31    MI x5  . Stroke Father     CVA x7  . Asthma Father   . Brain cancer Father   . Seizures Father   . Anxiety disorder Sister   . OCD Sister   . Sexual abuse Sister   . Physical abuse Sister   . Anxiety disorder Sister   . ADD / ADHD Daughter   . ADD / ADHD Daughter   . Alcohol abuse Neg Hx   . Drug abuse Neg Hx   . Schizophrenia Neg Hx     Review of Systems  Constitutional: Negative. HENT: Positive for runny nose. Increased mucus production consistent with allergies Eyes: Positive for vision changes.   Respiratory: Positive for shortness of breath.  Difficulty taking deep breaths associated with costochodritis Cardiovascular: Positive for chest pain.  Chest pain associated with costochodritis  Gastrointestinal: Negative  Genitourinary: Negative Musculoskeletal: Positive for joint pain. Skin: Negative.   Neurological: Positive for headache. Endo/Heme/Allergies: Negative.   Psychiatric/Behavioral: Negative.    14 point review of systems was performed and is negative except as detailed under history of present illness and above   PHYSICAL EXAMINATION  ECOG PERFORMANCE STATUS: 0 - Asymptomatic  Vitals:   06/28/16 1138  BP: 125/71  Pulse: 80  Resp: 18  Temp: 98.4 F (36.9 C)    Physical Exam  Constitutional: She is oriented to person, place, and time and well-developed, well-nourished, and in no distress.  HENT:  Head: Normocephalic and atraumatic.  Nose: Nose normal.  Mouth/Throat: Oropharynx is clear and moist. No oropharyngeal exudate.   Eyes: Conjunctivae and EOM are normal. Pupils are equal, round, and reactive to light. Right eye exhibits no discharge. Left eye exhibits no discharge. No scleral icterus.  Neck: Normal range of motion. Neck supple. No tracheal deviation present. No thyromegaly present.  Cardiovascular: Normal rate, regular rhythm and normal heart sounds.  Exam reveals no gallop and no friction rub.   No murmur heard. Pulmonary/Chest: Effort normal and breath sounds normal. She has no wheezes. She has no rales.  Abdominal: Soft. Bowel sounds are normal. She exhibits no distension and no mass. There is no tenderness. There is no rebound and no guarding.  Musculoskeletal: Normal range of motion. She exhibits no edema.  Lymphadenopathy:    She has no cervical adenopathy.  Neurological: She is alert and oriented to person, place, and time. She has normal reflexes. No cranial nerve deficit. Gait normal. Coordination normal.  Skin: Skin is warm and dry. No rash noted.  Psychiatric: Mood, memory, affect and judgment normal.  Nursing note and vitals reviewed.   LABORATORY DATA: I have reviewed the data as listed. CBC    Component Value Date/Time   WBC 7.0 06/28/2016 1046   RBC 4.46 06/28/2016 1046   HGB 14.3 06/28/2016 1046   HCT 39.7 06/28/2016 1046   PLT 270 06/28/2016 1046   MCV 89.0 06/28/2016 1046   MCH 32.1 06/28/2016 1046   MCHC 36.0 06/28/2016 1046   RDW 14.4 06/28/2016 1046   LYMPHSABS 2.3 06/28/2016 1046   MONOABS 0.4 06/28/2016 1046   EOSABS 0.3 06/28/2016 1046   BASOSABS 0.1 06/28/2016 1046   CMP     Component Value Date/Time   NA 136 06/28/2016 1046   K 4.5 06/28/2016 1046   CL 103 06/28/2016 1046   CO2 27 06/28/2016 1046   GLUCOSE 144 (H) 06/28/2016 1046   BUN 11 06/28/2016 1046   CREATININE 0.87 06/28/2016 1046   CREATININE 0.88 02/21/2012 1153   CALCIUM 9.4 06/28/2016 1046   PROT 6.8 06/28/2016 1046   ALBUMIN 4.4 06/28/2016 1046   AST 30 06/28/2016 1046   ALT 41 06/28/2016  1046   ALKPHOS 47 06/28/2016 1046   BILITOT 0.5 06/28/2016 1046   GFRNONAA >60 06/28/2016 1046   GFRAA >60 06/28/2016 1046   Results for ANJELA, WHITSEL (MRN [id number]) as of 06/29/2016 08:17  Ref. 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 I would continue with observation.   I have encouraged her to follow-up with Dr. Nevada Crane regarding a lot of her generalized concerns. I have encouraged her to follow-up with an opthamologist regarding her visual concerns.   Laboratory studies reviewed.  She will return for follow up in 4 months. She knows to call sooner if her symptoms worsen.  All questions were answered. The patient knows to call the clinic with any problems, questions or concerns. We can certainly see the patient much sooner if necessary.   This document serves as a record of services personally performed by Ancil Linsey, MD. It was created on her behalf by Arlyce Harman, a trained medical scribe. The creation of this record is based on the scribe's personal observations and the provider's statements to them. This document has been checked and approved by the attending provider.  I have reviewed the above documentation for accuracy and completeness, and I agree with the above.  This note was electronically signed.  Kelby Fam. Whitney Muse, MD

## 2016-06-29 ENCOUNTER — Encounter (HOSPITAL_COMMUNITY): Payer: Self-pay | Admitting: Hematology & Oncology

## 2016-06-29 LAB — IGG, IGA, IGM
IgA: 51 mg/dL — ABNORMAL LOW (ref 87–352)
IgG (Immunoglobin G), Serum: 552 mg/dL — ABNORMAL LOW (ref 700–1600)
IgM, Serum: 25 mg/dL — ABNORMAL LOW (ref 26–217)

## 2016-07-06 DIAGNOSIS — M791 Myalgia: Secondary | ICD-10-CM | POA: Diagnosis not present

## 2016-07-06 DIAGNOSIS — R5383 Other fatigue: Secondary | ICD-10-CM | POA: Diagnosis not present

## 2016-07-11 DIAGNOSIS — E119 Type 2 diabetes mellitus without complications: Secondary | ICD-10-CM | POA: Diagnosis not present

## 2016-07-11 DIAGNOSIS — E559 Vitamin D deficiency, unspecified: Secondary | ICD-10-CM | POA: Diagnosis not present

## 2016-07-11 DIAGNOSIS — E782 Mixed hyperlipidemia: Secondary | ICD-10-CM | POA: Diagnosis not present

## 2016-07-11 DIAGNOSIS — E039 Hypothyroidism, unspecified: Secondary | ICD-10-CM | POA: Diagnosis not present

## 2016-07-14 DIAGNOSIS — E782 Mixed hyperlipidemia: Secondary | ICD-10-CM | POA: Diagnosis not present

## 2016-07-14 DIAGNOSIS — D839 Common variable immunodeficiency, unspecified: Secondary | ICD-10-CM | POA: Diagnosis not present

## 2016-07-14 DIAGNOSIS — E119 Type 2 diabetes mellitus without complications: Secondary | ICD-10-CM | POA: Diagnosis not present

## 2016-07-14 DIAGNOSIS — G894 Chronic pain syndrome: Secondary | ICD-10-CM | POA: Diagnosis not present

## 2016-07-14 DIAGNOSIS — D509 Iron deficiency anemia, unspecified: Secondary | ICD-10-CM | POA: Diagnosis not present

## 2016-07-14 DIAGNOSIS — M797 Fibromyalgia: Secondary | ICD-10-CM | POA: Diagnosis not present

## 2016-07-25 ENCOUNTER — Encounter (HOSPITAL_COMMUNITY): Payer: Self-pay | Admitting: Psychiatry

## 2016-07-25 NOTE — Progress Notes (Signed)
  Outpatient Therapist Discharge Summary  Michelle Clements    27-Nov-1966   Admission Date: 03/01/2016  Discharge Date:  07/25/2016 Reason for Discharge:  Patient decided to discontinue treatment due to financial reasons per her report. Diagnosis:  Axis I:  MDD   Comments:    Ladene Artist, LCSW 07/25/2016

## 2016-08-03 ENCOUNTER — Telehealth (HOSPITAL_COMMUNITY): Payer: Self-pay | Admitting: *Deleted

## 2016-08-03 DIAGNOSIS — M797 Fibromyalgia: Secondary | ICD-10-CM | POA: Diagnosis not present

## 2016-08-03 DIAGNOSIS — M4722 Other spondylosis with radiculopathy, cervical region: Secondary | ICD-10-CM | POA: Diagnosis not present

## 2016-08-03 DIAGNOSIS — M4726 Other spondylosis with radiculopathy, lumbar region: Secondary | ICD-10-CM | POA: Diagnosis not present

## 2016-08-03 NOTE — Telephone Encounter (Signed)
left voice message regarding provider out of office 08/19/16.

## 2016-08-09 DIAGNOSIS — H5713 Ocular pain, bilateral: Secondary | ICD-10-CM | POA: Diagnosis not present

## 2016-08-15 DIAGNOSIS — G894 Chronic pain syndrome: Secondary | ICD-10-CM | POA: Diagnosis not present

## 2016-08-15 DIAGNOSIS — G4733 Obstructive sleep apnea (adult) (pediatric): Secondary | ICD-10-CM | POA: Diagnosis not present

## 2016-08-19 ENCOUNTER — Ambulatory Visit (HOSPITAL_COMMUNITY): Payer: Self-pay | Admitting: Psychiatry

## 2016-09-02 ENCOUNTER — Encounter (HOSPITAL_COMMUNITY): Payer: Self-pay | Admitting: Psychiatry

## 2016-09-02 ENCOUNTER — Ambulatory Visit (INDEPENDENT_AMBULATORY_CARE_PROVIDER_SITE_OTHER): Payer: PPO | Admitting: Psychiatry

## 2016-09-02 VITALS — BP 134/78 | HR 93 | Ht 71.0 in | Wt 237.4 lb

## 2016-09-02 DIAGNOSIS — F3160 Bipolar disorder, current episode mixed, unspecified: Secondary | ICD-10-CM

## 2016-09-02 DIAGNOSIS — F332 Major depressive disorder, recurrent severe without psychotic features: Secondary | ICD-10-CM

## 2016-09-02 MED ORDER — FLUOXETINE HCL 20 MG PO CAPS: 20.0000 mg | ORAL_CAPSULE | Freq: Every day | ORAL | 2 refills | Status: DC

## 2016-09-02 MED ORDER — RISPERIDONE 1 MG PO TABS: 1.0000 mg | ORAL_TABLET | Freq: Every day | ORAL | 2 refills | Status: DC
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
## 2016-09-23 ENCOUNTER — Encounter (HOSPITAL_COMMUNITY): Payer: Self-pay | Admitting: Psychiatry

## 2016-09-23 ENCOUNTER — Ambulatory Visit (INDEPENDENT_AMBULATORY_CARE_PROVIDER_SITE_OTHER): Payer: PPO | Admitting: Psychiatry

## 2016-09-23 VITALS — BP 114/82 | HR 77 | Ht 71.0 in | Wt 240.0 lb

## 2016-09-23 DIAGNOSIS — Z79899 Other long term (current) drug therapy: Secondary | ICD-10-CM | POA: Diagnosis not present

## 2016-09-23 DIAGNOSIS — F332 Major depressive disorder, recurrent severe without psychotic features: Secondary | ICD-10-CM

## 2016-09-23 MED ORDER — FLUOXETINE HCL 20 MG PO CAPS: 20.0000 mg | ORAL_CAPSULE | Freq: Every day | ORAL | 2 refills | Status: DC

## 2016-09-23 MED ORDER — RISPERIDONE 1 MG PO TABS: 1.0000 mg | ORAL_TABLET | Freq: Every day | ORAL | 2 refills | Status: DC

## 2016-09-23 MED ORDER — PRAZOSIN HCL 2 MG PO CAPS: 2.0000 mg | ORAL_CAPSULE | Freq: Every day | ORAL | 2 refills | Status: DC
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
## 2016-09-27 ENCOUNTER — Telehealth (HOSPITAL_COMMUNITY): Payer: Self-pay | Admitting: *Deleted

## 2016-09-27 NOTE — Telephone Encounter (Signed)
[facility] called to get quantity dispense changed for pt. Per Jerene Canny at [phone], pt Minipress and Fluoxetine was sent to them for 30 tablets only and they are a mail order and would like authorization to fill 3 months worth for pt. Asked Dr. Harrington Challenger verbally and she gived permission to go ahead. Missy Verbalized understanding.

## 2016-09-30 DIAGNOSIS — M25551 Pain in right hip: Secondary | ICD-10-CM | POA: Diagnosis not present

## 2016-10-05 ENCOUNTER — Telehealth (HOSPITAL_COMMUNITY): Payer: Self-pay | Admitting: *Deleted

## 2016-10-05 NOTE — Telephone Encounter (Signed)
phone call from patient.  she wants to increase Prozac.  she is having a lot of problems with depression.

## 2016-10-05 NOTE — Telephone Encounter (Signed)
She will need to come in if possible, ask Harle Battiest to call her

## 2016-10-06 ENCOUNTER — Telehealth (HOSPITAL_COMMUNITY): Payer: Self-pay | Admitting: *Deleted

## 2016-10-06 NOTE — Telephone Encounter (Signed)
Returned phone call to patient, no answer, left voice message.

## 2016-10-06 NOTE — Telephone Encounter (Signed)
Michelle Clements please call patient, she left voice message, I returned call but no answer.

## 2016-10-06 NOTE — Telephone Encounter (Signed)
lmtcb

## 2016-10-07 NOTE — Telephone Encounter (Signed)
Pt was schedule to come in for a sooner appt for 10-11-2016. Offered appt for today but pt lives 30 minutes away and still have to get dressed. Pt agreed to come in for f/u on 10-11-2016 and stated she should be fine until appt

## 2016-10-07 NOTE — Telephone Encounter (Signed)
Spoke with pt

## 2016-10-11 ENCOUNTER — Ambulatory Visit (INDEPENDENT_AMBULATORY_CARE_PROVIDER_SITE_OTHER): Payer: PPO | Admitting: Psychiatry

## 2016-10-11 ENCOUNTER — Encounter (HOSPITAL_COMMUNITY): Payer: Self-pay | Admitting: Psychiatry

## 2016-10-11 VITALS — BP 88/60 | HR 58 | Ht 71.0 in | Wt 241.2 lb

## 2016-10-11 DIAGNOSIS — F332 Major depressive disorder, recurrent severe without psychotic features: Secondary | ICD-10-CM

## 2016-10-11 MED ORDER — RISPERIDONE 1 MG PO TABS: 1.0000 mg | ORAL_TABLET | Freq: Every day | ORAL | 2 refills | Status: DC

## 2016-10-11 MED ORDER — PRAZOSIN HCL 2 MG PO CAPS: 2.0000 mg | ORAL_CAPSULE | Freq: Every day | ORAL | 2 refills | Status: DC

## 2016-10-11 MED ORDER — FLUOXETINE HCL 40 MG PO CAPS: 40.0000 mg | ORAL_CAPSULE | Freq: Every day | ORAL | 2 refills | Status: DC
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
## 2016-10-21 DIAGNOSIS — Z23 Encounter for immunization: Secondary | ICD-10-CM | POA: Diagnosis not present

## 2016-10-31 ENCOUNTER — Ambulatory Visit (HOSPITAL_COMMUNITY): Payer: Self-pay | Admitting: Hematology & Oncology

## 2016-10-31 ENCOUNTER — Other Ambulatory Visit (HOSPITAL_COMMUNITY): Payer: Self-pay

## 2016-11-03 DIAGNOSIS — E039 Hypothyroidism, unspecified: Secondary | ICD-10-CM | POA: Diagnosis not present

## 2016-11-03 DIAGNOSIS — E782 Mixed hyperlipidemia: Secondary | ICD-10-CM | POA: Diagnosis not present

## 2016-11-03 DIAGNOSIS — I10 Essential (primary) hypertension: Secondary | ICD-10-CM | POA: Diagnosis not present

## 2016-11-03 DIAGNOSIS — E119 Type 2 diabetes mellitus without complications: Secondary | ICD-10-CM | POA: Diagnosis not present

## 2016-11-07 DIAGNOSIS — I10 Essential (primary) hypertension: Secondary | ICD-10-CM | POA: Diagnosis not present

## 2016-11-07 DIAGNOSIS — E782 Mixed hyperlipidemia: Secondary | ICD-10-CM | POA: Diagnosis not present

## 2016-11-07 DIAGNOSIS — E119 Type 2 diabetes mellitus without complications: Secondary | ICD-10-CM | POA: Diagnosis not present

## 2016-11-07 DIAGNOSIS — M797 Fibromyalgia: Secondary | ICD-10-CM | POA: Diagnosis not present

## 2016-11-14 ENCOUNTER — Ambulatory Visit (HOSPITAL_COMMUNITY): Payer: Self-pay | Admitting: Psychiatry

## 2016-11-21 ENCOUNTER — Encounter (HOSPITAL_COMMUNITY): Payer: PPO | Attending: Hematology & Oncology | Admitting: Hematology & Oncology

## 2016-11-21 ENCOUNTER — Encounter (HOSPITAL_COMMUNITY): Payer: Self-pay | Admitting: Hematology & Oncology

## 2016-11-21 ENCOUNTER — Other Ambulatory Visit (HOSPITAL_COMMUNITY): Payer: Self-pay | Admitting: Hematology & Oncology

## 2016-11-21 ENCOUNTER — Encounter (HOSPITAL_COMMUNITY): Payer: PPO

## 2016-11-21 VITALS — BP 107/59 | HR 66 | Temp 98.2°F | Resp 16 | Wt 237.6 lb

## 2016-11-21 DIAGNOSIS — Z1231 Encounter for screening mammogram for malignant neoplasm of breast: Secondary | ICD-10-CM

## 2016-11-21 DIAGNOSIS — Z1239 Encounter for other screening for malignant neoplasm of breast: Secondary | ICD-10-CM

## 2016-11-21 DIAGNOSIS — D839 Common variable immunodeficiency, unspecified: Secondary | ICD-10-CM

## 2016-11-21 LAB — CBC WITH DIFFERENTIAL/PLATELET
BASOS PCT: 1 %
Basophils Absolute: 0.1 10*3/uL (ref 0.0–0.1)
EOS ABS: 0.2 10*3/uL (ref 0.0–0.7)
Eosinophils Relative: 3 %
HCT: 39.9 % (ref 36.0–46.0)
Hemoglobin: 14.1 g/dL (ref 12.0–15.0)
Lymphocytes Relative: 26 %
Lymphs Abs: 1.8 10*3/uL (ref 0.7–4.0)
MCH: 34.5 pg — ABNORMAL HIGH (ref 26.0–34.0)
MCHC: 35.3 g/dL (ref 30.0–36.0)
MCV: 97.6 fL (ref 78.0–100.0)
MONO ABS: 0.3 10*3/uL (ref 0.1–1.0)
MONOS PCT: 5 %
Neutro Abs: 4.7 10*3/uL (ref 1.7–7.7)
Neutrophils Relative %: 65 %
Platelets: 248 10*3/uL (ref 150–400)
RBC: 4.09 MIL/uL (ref 3.87–5.11)
RDW: 12.7 % (ref 11.5–15.5)
WBC: 7.2 10*3/uL (ref 4.0–10.5)

## 2016-11-21 LAB — COMPREHENSIVE METABOLIC PANEL
ALBUMIN: 4.2 g/dL (ref 3.5–5.0)
ALT: 23 U/L (ref 14–54)
ANION GAP: 6 (ref 5–15)
AST: 21 U/L (ref 15–41)
Alkaline Phosphatase: 39 U/L (ref 38–126)
BILIRUBIN TOTAL: 0.6 mg/dL (ref 0.3–1.2)
BUN: 11 mg/dL (ref 6–20)
CO2: 26 mmol/L (ref 22–32)
Calcium: 9.7 mg/dL (ref 8.9–10.3)
Chloride: 102 mmol/L (ref 101–111)
Creatinine, Ser: 0.92 mg/dL (ref 0.44–1.00)
GFR calc non Af Amer: >60 mL/min (ref >60)
GLUCOSE: 293 mg/dL — AB (ref 65–99)
POTASSIUM: 4.6 mmol/L (ref 3.5–5.1)
SODIUM: 134 mmol/L — AB (ref 135–145)
TOTAL PROTEIN: 6.6 g/dL (ref 6.5–8.1)

## 2016-11-21 NOTE — Patient Instructions (Addendum)
[place] at [facility] Discharge Instructions  RECOMMENDATIONS MADE BY THE CONSULTANT AND ANY TEST RESULTS WILL BE SENT TO YOUR REFERRING PHYSICIAN.  You saw Dr.Penland today. Follow up in 6 months with labs. Mammogram this month. See Amy at checkout for appointments.  Thank you for choosing [place] at [facility] to provide your oncology and hematology care.  To afford each patient quality time with our provider, please arrive at least 15 minutes before your scheduled appointment time.   Beginning January 23rd 2017 lab work for the [facility] will be done in the  Main lab at [facility] on 1st floor. If you have a lab appointment with the [facility] please come in thru the  Main Entrance and check in at the main information desk  You need to re-schedule your appointment should you arrive 10 or more minutes late.  We strive to give you quality time with our providers, and arriving late affects you and other patients whose appointments are after yours.  Also, if you no show three or more times for appointments you may be dismissed from the clinic at the providers discretion.     Again, thank you for choosing [facility].  Our hope is that these requests will decrease the amount of time that you wait before being seen by our physicians.       _____________________________________________________________  Should you have questions after your visit to [facility], please contact our office at [phone] between the hours of 8:30 a.m. and 4:30 p.m.  Voicemails left after 4:30 p.m. will not be returned until the following business day.  For prescription refill requests, have your pharmacy contact our office.         Resources For Cancer Patients and their Caregivers ? American Cancer Society: Can assist with transportation, wigs, general needs, runs Look Good Feel Better.        (613)420-6349 ? Cancer  Care: Provides financial assistance, online support groups, medication/co-pay assistance.  1-800-813-HOPE (854)569-3554) ? [place] Assists [place] cancer patients and their families through emotional , educational and financial support.  909-819-3498 ? [place] DSS Where to apply for food stamps, Medicaid and utility assistance. 920-749-3054 ? [facility]: Transportation to medical appointments. 408-077-3361 ? Social Security Administration: May apply for disability if have a Stage IV cancer. (323)463-3776 830-005-6227 ? [employer], Disability and Transit Services: Assists with nutrition, care and transit needs. [place] Support Programs: @10RELATIVEDAYS @ > Cancer Support Group  2nd Tuesday of the month 1pm-2pm, Journey Room  > Creative Journey  3rd Tuesday of the month 1130am-1pm, Journey Room  > Look Good Feel Better  1st Wednesday of the month 10am-12 noon, Journey Room (Call [facility] to register [phone])

## 2016-11-21 NOTE — Progress Notes (Signed)
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 female returns for follow-up of her common variable immunodeficiency.    Michelle Clements is unaccompanied. I have reviewed the labs with the patient. She has been seeing Dr. Harrington Challenger again for her depression. She notes that she is off cymbalta, "it was making me crazy."  She feels good. She's very tired. She is still getting her periods.   She is on pravastatin 40mg  for her high cholesterol. Tri-cor was just added.  She is no longer following a gluten free diet.   She is feeling tightness in her throat today. She's had 2 colds since her last visit, no bronchitis, no sinusitis. No fever or chills.  She has not had her mammogram yet.   MEDICAL HISTORY: Past Medical History:  Diagnosis Date  . Allergic rhinitis   . Anemia   . Arthritis    Lower back and hips  . Asthma   . Bipolar disorder ([place])   . Compulsive behavior disorder   . Constipation   . 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[place])   . Depression   . Essential hypertension, benign   . Fibromyalgia   . GERD (gastroesophageal reflux disease)   . H/O sleep apnea   . Hip pain, left   . History of palpitations    Negative Holter monitor  . History of pneumonia 1988  . Hyperlipidemia   . IBS (irritable bowel syndrome)   . Neck pain   . Poor short term memory   . PTSD (post-traumatic stress disorder)   . S/P Botox injection 11/2014    For migraine headaches  . Type 2 diabetes mellitus ([place])   . Urgency of urination     has Unspecified vitamin D deficiency; Mixed hyperlipidemia; Morbid obesity ([place]); BIPOLAR AFFECTIVE DISORDER; Hx of tobacco use, presenting hazards to health; DEPRESSION; Essential hypertension, benign; IBS; FIBROMYALGIA;  Palpitations; Neuropathy of upper extremity; History of stroke in prior 3 months; Headache(784.0); Cervicalgia; Acute confusional state; 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[place]); Migraine; and Neck pain of over 3 months duration on her problem list.     No history exists.     is allergic to sulfonamide derivatives; gluten meal; molds & smuts; clonazepam; dust mite extract; and tegretol [carbamazepine].  Michelle Clements had no medications administered during this visit.  SURGICAL HISTORY: Past Surgical History:  Procedure Laterality Date  . CARPAL TUNNEL RELEASE Right 06/10/2013   Procedure: CARPAL TUNNEL RELEASE;  Surgeon: Faythe Ghee, MD;  Location: [facility];  Service: Neurosurgery;  Laterality: Right;  Right Carpal Tunnel Release   . CHOLECYSTECTOMY    . DENTAL SURGERY  12/2014  . mole exc     x 3  . MUSCLE BIOPSY  2008   Right leg  . TUBAL LIGATION      SOCIAL HISTORY: Social History   Social History  . Marital status: Married    Spouse name: N/A  . Number of children: 2  . Years of education: College   Occupational History  . Umemployed Unemployed    Now disabled due to bipolar and fibromyalgia   Social History Main Topics  . Smoking status: Former Smoker    Types: Cigarettes    Quit date: 11/04/2010  . Smokeless tobacco: Never Used  . Alcohol use 0.0 oz/week  Comment: one mixed drink per week  . Drug use: No  . Sexual activity: Yes    Birth control/ protection: Surgical   Other Topics Concern  . Not on file   Social History Narrative   Married   Lives with spouse and daughter   Right handed.   Caffeine use: 2 cups coffee in morning and 2 cups tea during the day     FAMILY HISTORY: Family History  Problem Relation Age of Onset  . Fibromyalgia Mother   . Diabetes type II Mother   . Depression Mother   . Alzheimer's disease Mother   . GER disease Mother   . Heart disease Mother   . Paranoid behavior Mother   . Dementia Mother   . Heart attack  Father 31    MI x5  . Stroke Father     CVA x7  . Asthma Father   . Brain cancer Father   . Seizures Father   . Anxiety disorder Sister   . OCD Sister   . Sexual abuse Sister   . Physical abuse Sister   . Anxiety disorder Sister   . ADD / ADHD Daughter   . ADD / ADHD Daughter   . Alcohol abuse Neg Hx   . Drug abuse Neg Hx   . Schizophrenia Neg Hx     Review of Systems  Constitutional: Positive for malaise/fatigue.  HENT: Negative.        Tightness in throat.  Eyes: Negative.   Respiratory: Negative.   Cardiovascular: Negative.   Gastrointestinal: Negative.   Genitourinary: Negative.   Musculoskeletal: Negative.   Skin: Negative.   Neurological: Negative.   Endo/Heme/Allergies: Negative.   Psychiatric/Behavioral: Negative.   All other systems reviewed and are negative. 14 point review of systems was performed and is negative except as detailed under history of present illness and above   PHYSICAL EXAMINATION  ECOG PERFORMANCE STATUS: 0 - Asymptomatic  Vitals:   11/21/16 1017  BP: (!) 107/59  Pulse: 66  Resp: 16  Temp: 98.2 F (36.8 C)     Physical Exam  Constitutional: She is oriented to person, place, and time and well-developed, well-nourished, and in no distress.  Wears glasses. Pt was able to get on exam table without assistance.   HENT:  Head: Normocephalic and atraumatic.  Eyes: EOM are normal. Pupils are equal, round, and reactive to light.  Neck: Normal range of motion. Neck supple.  Cardiovascular: Normal rate, regular rhythm and normal heart sounds.   Pulmonary/Chest: Effort normal and breath sounds normal.  Abdominal: Soft. Bowel sounds are normal.  Musculoskeletal: Normal range of motion.  Neurological: She is alert and oriented to person, place, and time. Gait normal.  Skin: Skin is warm and dry.  Nursing note and vitals reviewed.   LABORATORY DATA: I have reviewed the data as listed. CBC    Component Value Date/Time   WBC 7.2  11/21/2016 0941   RBC 4.09 11/21/2016 0941   HGB 14.1 11/21/2016 0941   HCT 39.9 11/21/2016 0941   PLT 248 11/21/2016 0941   MCV 97.6 11/21/2016 0941   MCH 34.5 (H) 11/21/2016 0941   MCHC 35.3 11/21/2016 0941   RDW 12.7 11/21/2016 0941   LYMPHSABS 1.8 11/21/2016 0941   MONOABS 0.3 11/21/2016 0941   EOSABS 0.2 11/21/2016 0941   BASOSABS 0.1 11/21/2016 0941   CMP     Component Value Date/Time   NA 134 (L) 11/21/2016 0941   K 4.6 11/21/2016 0941  CL 102 11/21/2016 0941   CO2 26 11/21/2016 0941   GLUCOSE 293 (H) 11/21/2016 0941   BUN 11 11/21/2016 0941   CREATININE 0.92 11/21/2016 0941   CREATININE 0.88 02/21/2012 1153   CALCIUM 9.7 11/21/2016 0941   PROT 6.6 11/21/2016 0941   ALBUMIN 4.2 11/21/2016 0941   AST 21 11/21/2016 0941   ALT 23 11/21/2016 0941   ALKPHOS 39 11/21/2016 0941   BILITOT 0.6 11/21/2016 0941   GFRNONAA >60 11/21/2016 0941   GFRAA >60 11/21/2016 0941    ASSESSMENT and THERAPY PLAN:  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 I would continue with observation. IgG is pending today. She will be notified of results when available.   She will continue to follow with Dr. Nevada Crane for her primary care issues. She knows to notify us if she develops recurrent bronchitis or sinusitis.  Laboratory studies reviewed.  I will order her a mammogram.   She will return for a follow up in 6 months.   All questions were answered. The patient knows to call the clinic with any problems, questions or concerns. We can certainly see the patient much sooner if necessary.   This document serves as a record of services personally performed by Ancil Linsey, MD. It was created on her behalf by Martinique Casey, a trained medical scribe. The creation of this record is based on the scribe's personal observations  and the provider's statements to them. This document has been checked and approved by the attending provider.  I have reviewed the above documentation for accuracy and completeness, and I agree with the above.  This note was electronically signed.  Kelby Fam. Whitney Muse, MD

## 2016-11-22 LAB — IGG, IGA, IGM
IGA: 57 mg/dL — AB (ref 87–352)
IGG (IMMUNOGLOBIN G), SERUM: 527 mg/dL — AB (ref 700–1600)
IgM, Serum: 22 mg/dL — ABNORMAL LOW (ref 26–217)

## 2016-12-08 ENCOUNTER — Ambulatory Visit (INDEPENDENT_AMBULATORY_CARE_PROVIDER_SITE_OTHER): Payer: PPO | Admitting: Psychiatry

## 2016-12-08 ENCOUNTER — Encounter (HOSPITAL_COMMUNITY): Payer: Self-pay | Admitting: Psychiatry

## 2016-12-08 VITALS — BP 111/63 | HR 69 | Ht 71.0 in | Wt 238.4 lb

## 2016-12-08 DIAGNOSIS — F332 Major depressive disorder, recurrent severe without psychotic features: Secondary | ICD-10-CM

## 2016-12-08 DIAGNOSIS — Z79899 Other long term (current) drug therapy: Secondary | ICD-10-CM | POA: Diagnosis not present

## 2016-12-08 MED ORDER — FLUOXETINE HCL 40 MG PO CAPS: 40.0000 mg | ORAL_CAPSULE | Freq: Every day | ORAL | 2 refills | Status: DC

## 2016-12-08 MED ORDER — CLONAZEPAM 0.5 MG PO TABS: 0.5000 mg | ORAL_TABLET | Freq: Two times a day (BID) | ORAL | 2 refills | Status: DC | PRN

## 2016-12-08 MED ORDER — RISPERIDONE 1 MG PO TABS: 1.0000 mg | ORAL_TABLET | Freq: Every day | ORAL | 2 refills | Status: DC

## 2016-12-08 MED ORDER — PRAZOSIN HCL 2 MG PO CAPS: 2.0000 mg | ORAL_CAPSULE | Freq: Every day | ORAL | 2 refills | Status: DC
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
## 2016-12-22 ENCOUNTER — Ambulatory Visit (HOSPITAL_COMMUNITY): Payer: Self-pay

## 2016-12-23 ENCOUNTER — Ambulatory Visit (HOSPITAL_COMMUNITY)
Admission: RE | Admit: 2016-12-23 | Discharge: 2016-12-23 | Disposition: A | Discharge status: Home / Self Care | Payer: PPO | Source: Ambulatory Visit | Attending: Hematology & Oncology | Admitting: Hematology & Oncology

## 2016-12-23 DIAGNOSIS — Z1231 Encounter for screening mammogram for malignant neoplasm of breast: Secondary | ICD-10-CM | POA: Insufficient documentation

## 2017-02-21 DIAGNOSIS — I10 Essential (primary) hypertension: Secondary | ICD-10-CM | POA: Diagnosis not present

## 2017-02-21 DIAGNOSIS — E119 Type 2 diabetes mellitus without complications: Secondary | ICD-10-CM | POA: Diagnosis not present

## 2017-02-23 DIAGNOSIS — Z6832 Body mass index (BMI) 32.0-32.9, adult: Secondary | ICD-10-CM | POA: Diagnosis not present

## 2017-02-23 DIAGNOSIS — M797 Fibromyalgia: Secondary | ICD-10-CM | POA: Diagnosis not present

## 2017-02-23 DIAGNOSIS — M4800 Spinal stenosis, site unspecified: Secondary | ICD-10-CM | POA: Diagnosis not present

## 2017-02-23 DIAGNOSIS — D649 Anemia, unspecified: Secondary | ICD-10-CM | POA: Diagnosis not present

## 2017-02-23 DIAGNOSIS — D839 Common variable immunodeficiency, unspecified: Secondary | ICD-10-CM | POA: Diagnosis not present

## 2017-02-23 DIAGNOSIS — E782 Mixed hyperlipidemia: Secondary | ICD-10-CM | POA: Diagnosis not present

## 2017-02-23 DIAGNOSIS — E119 Type 2 diabetes mellitus without complications: Secondary | ICD-10-CM | POA: Diagnosis not present

## 2017-02-23 DIAGNOSIS — R945 Abnormal results of liver function studies: Secondary | ICD-10-CM | POA: Diagnosis not present

## 2017-02-23 DIAGNOSIS — J45909 Unspecified asthma, uncomplicated: Secondary | ICD-10-CM | POA: Diagnosis not present

## 2017-03-08 ENCOUNTER — Encounter (HOSPITAL_COMMUNITY): Payer: Self-pay | Admitting: Psychiatry

## 2017-03-08 ENCOUNTER — Ambulatory Visit (INDEPENDENT_AMBULATORY_CARE_PROVIDER_SITE_OTHER): Payer: PPO | Admitting: Psychiatry

## 2017-03-08 VITALS — BP 93/58 | HR 78 | Ht 71.0 in | Wt 237.6 lb

## 2017-03-08 DIAGNOSIS — Z79899 Other long term (current) drug therapy: Secondary | ICD-10-CM

## 2017-03-08 DIAGNOSIS — F332 Major depressive disorder, recurrent severe without psychotic features: Secondary | ICD-10-CM | POA: Diagnosis not present

## 2017-03-08 MED ORDER — CLONAZEPAM 0.5 MG PO TABS: 0.5000 mg | ORAL_TABLET | Freq: Two times a day (BID) | ORAL | 2 refills | Status: DC | PRN

## 2017-03-08 MED ORDER — RISPERIDONE 1 MG PO TABS: 1.0000 mg | ORAL_TABLET | Freq: Every day | ORAL | 2 refills | Status: DC

## 2017-03-08 MED ORDER — FLUOXETINE HCL 40 MG PO CAPS: 40.0000 mg | ORAL_CAPSULE | Freq: Every day | ORAL | 2 refills | Status: DC

## 2017-03-08 MED ORDER — PRAZOSIN HCL 2 MG PO CAPS: 2.0000 mg | ORAL_CAPSULE | Freq: Every day | ORAL | 2 refills | Status: DC
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
## 2017-03-13 ENCOUNTER — Telehealth (HOSPITAL_COMMUNITY): Payer: Self-pay | Admitting: *Deleted

## 2017-03-13 ENCOUNTER — Other Ambulatory Visit (HOSPITAL_COMMUNITY): Payer: Self-pay | Admitting: Psychiatry

## 2017-03-13 MED ORDER — FLUOXETINE HCL 40 MG PO CAPS: 40.0000 mg | ORAL_CAPSULE | Freq: Every day | ORAL | 2 refills | Status: DC

## 2017-03-13 MED ORDER — PRAZOSIN HCL 2 MG PO CAPS: 2.0000 mg | ORAL_CAPSULE | Freq: Every day | ORAL | 2 refills | Status: DC

## 2017-03-13 NOTE — Telephone Encounter (Signed)
done

## 2017-03-13 NOTE — Telephone Encounter (Signed)
noted 

## 2017-03-13 NOTE — Telephone Encounter (Signed)
Lisa from [facility] order called to get 90 days supply for pt Minipress and Fluoxetine. Per Lattie Haw, they received 30 days supply and due to them being a mail order, they need 90 days supply because it's cheaper for pt. Lattie Haw number is [phone].

## 2017-03-20 ENCOUNTER — Telehealth (HOSPITAL_COMMUNITY): Payer: Self-pay | Admitting: *Deleted

## 2017-03-20 NOTE — Telephone Encounter (Signed)
voice message from patient on 03/17/17 at 2:15 p.m.  She wants to know where her meds were called in to.   She said her pharmacy did not receive anything for her from Dr. Harrington Challenger.   She said she is out of meds.

## 2017-03-21 NOTE — Telephone Encounter (Signed)
Michelle Clements, please check on this.Marland Kitchenthey were sent to pharmacy listed on the chart

## 2017-03-21 NOTE — Telephone Encounter (Signed)
Spoke with pt due to previous phone call. Per pt she is out of her Minipress. Informed pt that her medication was sent to her mail order pharmacy. Per pt she will call them instead to see where her medication is.

## 2017-05-16 ENCOUNTER — Ambulatory Visit (INDEPENDENT_AMBULATORY_CARE_PROVIDER_SITE_OTHER): Payer: PPO | Admitting: Psychiatry

## 2017-05-16 ENCOUNTER — Encounter (HOSPITAL_COMMUNITY): Payer: Self-pay | Admitting: Psychiatry

## 2017-05-16 VITALS — BP 120/84 | HR 84 | Ht 71.0 in | Wt 242.0 lb

## 2017-05-16 DIAGNOSIS — F332 Major depressive disorder, recurrent severe without psychotic features: Secondary | ICD-10-CM

## 2017-05-16 MED ORDER — RISPERIDONE 1 MG PO TABS: 1.0000 mg | ORAL_TABLET | Freq: Every day | ORAL | 2 refills | Status: DC

## 2017-05-16 MED ORDER — PRAZOSIN HCL 2 MG PO CAPS: 2.0000 mg | ORAL_CAPSULE | Freq: Every day | ORAL | 2 refills | Status: DC

## 2017-05-16 MED ORDER — CLONAZEPAM 0.5 MG PO TABS: 0.5000 mg | ORAL_TABLET | Freq: Two times a day (BID) | ORAL | 2 refills | Status: DC | PRN

## 2017-05-16 MED ORDER — FLUOXETINE HCL 40 MG PO CAPS: 40.0000 mg | ORAL_CAPSULE | Freq: Every day | ORAL | 2 refills | Status: DC
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
## 2017-05-22 ENCOUNTER — Ambulatory Visit (HOSPITAL_COMMUNITY): Payer: Self-pay

## 2017-05-22 ENCOUNTER — Encounter (HOSPITAL_COMMUNITY): Payer: Self-pay

## 2017-05-22 ENCOUNTER — Encounter (HOSPITAL_BASED_OUTPATIENT_CLINIC_OR_DEPARTMENT_OTHER): Payer: PPO | Admitting: Oncology

## 2017-05-22 ENCOUNTER — Encounter (HOSPITAL_COMMUNITY): Payer: PPO | Attending: Oncology

## 2017-05-22 VITALS — BP 105/56 | HR 66 | Resp 16 | Ht 71.0 in | Wt 242.0 lb

## 2017-05-22 DIAGNOSIS — I10 Essential (primary) hypertension: Secondary | ICD-10-CM | POA: Insufficient documentation

## 2017-05-22 DIAGNOSIS — Z87891 Personal history of nicotine dependence: Secondary | ICD-10-CM | POA: Diagnosis not present

## 2017-05-22 DIAGNOSIS — D839 Common variable immunodeficiency, unspecified: Secondary | ICD-10-CM

## 2017-05-22 DIAGNOSIS — M791 Myalgia: Secondary | ICD-10-CM | POA: Diagnosis not present

## 2017-05-22 DIAGNOSIS — Z8673 Personal history of transient ischemic attack (TIA), and cerebral infarction without residual deficits: Secondary | ICD-10-CM | POA: Insufficient documentation

## 2017-05-22 DIAGNOSIS — G629 Polyneuropathy, unspecified: Secondary | ICD-10-CM | POA: Insufficient documentation

## 2017-05-22 DIAGNOSIS — E559 Vitamin D deficiency, unspecified: Secondary | ICD-10-CM | POA: Diagnosis not present

## 2017-05-22 DIAGNOSIS — K589 Irritable bowel syndrome without diarrhea: Secondary | ICD-10-CM | POA: Insufficient documentation

## 2017-05-22 DIAGNOSIS — M797 Fibromyalgia: Secondary | ICD-10-CM | POA: Insufficient documentation

## 2017-05-22 DIAGNOSIS — F319 Bipolar disorder, unspecified: Secondary | ICD-10-CM | POA: Insufficient documentation

## 2017-05-22 DIAGNOSIS — E782 Mixed hyperlipidemia: Secondary | ICD-10-CM | POA: Diagnosis not present

## 2017-05-22 DIAGNOSIS — E119 Type 2 diabetes mellitus without complications: Secondary | ICD-10-CM | POA: Diagnosis not present

## 2017-05-22 DIAGNOSIS — K219 Gastro-esophageal reflux disease without esophagitis: Secondary | ICD-10-CM | POA: Diagnosis not present

## 2017-05-22 LAB — CBC WITH DIFFERENTIAL/PLATELET
BASOS ABS: 0.1 10*3/uL (ref 0.0–0.1)
BASOS PCT: 1 %
Eosinophils Absolute: 0.4 10*3/uL (ref 0.0–0.7)
Eosinophils Relative: 6 %
HEMATOCRIT: 38.1 % (ref 36.0–46.0)
HEMOGLOBIN: 13.5 g/dL (ref 12.0–15.0)
LYMPHS PCT: 27 %
Lymphs Abs: 1.8 10*3/uL (ref 0.7–4.0)
MCH: 33 pg (ref 26.0–34.0)
MCHC: 35.4 g/dL (ref 30.0–36.0)
MCV: 93.2 fL (ref 78.0–100.0)
MONO ABS: 0.4 10*3/uL (ref 0.1–1.0)
Monocytes Relative: 6 %
NEUTROS ABS: 4 10*3/uL (ref 1.7–7.7)
NEUTROS PCT: 60 %
Platelets: 258 10*3/uL (ref 150–400)
RBC: 4.09 MIL/uL (ref 3.87–5.11)
RDW: 12.3 % (ref 11.5–15.5)
WBC: 6.7 10*3/uL (ref 4.0–10.5)

## 2017-05-22 LAB — COMPREHENSIVE METABOLIC PANEL
ALT: 37 U/L (ref 14–54)
AST: 33 U/L (ref 15–41)
Albumin: 4.1 g/dL (ref 3.5–5.0)
Alkaline Phosphatase: 34 U/L — ABNORMAL LOW (ref 38–126)
Anion gap: 9 (ref 5–15)
BUN: 11 mg/dL (ref 6–20)
CHLORIDE: 99 mmol/L — AB (ref 101–111)
CO2: 26 mmol/L (ref 22–32)
Calcium: 9.3 mg/dL (ref 8.9–10.3)
Creatinine, Ser: 1.02 mg/dL — ABNORMAL HIGH (ref 0.44–1.00)
GFR calc Af Amer: >60 mL/min (ref >60)
Glucose, Bld: 296 mg/dL — ABNORMAL HIGH (ref 65–99)
POTASSIUM: 4.2 mmol/L (ref 3.5–5.1)
SODIUM: 134 mmol/L — AB (ref 135–145)
Total Bilirubin: 0.7 mg/dL (ref 0.3–1.2)
Total Protein: 6.4 g/dL — ABNORMAL LOW (ref 6.5–8.1)

## 2017-05-22 NOTE — Progress Notes (Signed)
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 female returns for follow-up of her common variable immunodeficiency.    Patient presents today for continued follow-up with her husband. She states she has been doing well since her last visit. She denies any sinopulmonary infections since her last visit. She has chronic fatigue and muscle aches due to her fibromyalgia. Otherwise she has no complaints today.  MEDICAL HISTORY: Past Medical History:  Diagnosis Date  . Allergic rhinitis   . Anemia   . Arthritis    Lower back and hips  . Asthma   . Bipolar disorder ([place])   . Compulsive behavior disorder   . Constipation   . 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[place])   . Depression   . Essential hypertension, benign   . Fibromyalgia   . GERD (gastroesophageal reflux disease)   . H/O sleep apnea   . Hip pain, left   . History of palpitations    Negative Holter monitor  . History of pneumonia 1988  . Hyperlipidemia   . IBS (irritable bowel syndrome)   . Neck pain   . Poor short term memory   . PTSD (post-traumatic stress disorder)   . S/P Botox injection 11/2014    For migraine headaches  . Type 2 diabetes mellitus ([place])   . Urgency of urination     has Unspecified vitamin D deficiency; Mixed hyperlipidemia; Morbid obesity ([place]); BIPOLAR AFFECTIVE DISORDER; Hx of tobacco use, presenting hazards to health; DEPRESSION; Essential hypertension, benign; IBS; FIBROMYALGIA; Palpitations; Neuropathy of upper extremity; History of stroke in prior 3 months; Headache(784.0); Cervicalgia; Acute confusional state; 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[place]); Migraine; and Neck pain of over 3 months duration on her problem list.     No history exists.      is allergic to sulfonamide derivatives; gluten meal; molds & smuts; clonazepam; dust mite extract; and tegretol [carbamazepine].  Ms. Inzunza had no medications administered during this visit.  SURGICAL HISTORY: Past Surgical History:  Procedure Laterality Date  . CARPAL TUNNEL RELEASE Right 06/10/2013   Procedure: CARPAL TUNNEL RELEASE;  Surgeon: Faythe Ghee, MD;  Location: [facility];  Service: Neurosurgery;  Laterality: Right;  Right Carpal Tunnel Release   . CHOLECYSTECTOMY    . DENTAL SURGERY  12/2014  . mole exc     x 3  . MUSCLE BIOPSY  2008   Right leg  . TUBAL LIGATION      SOCIAL HISTORY: Social History   Social History  . Marital status: Married    Spouse name: N/A  . Number of children: 2  . Years of education: College   Occupational History  . Umemployed Unemployed    Now disabled due to bipolar and fibromyalgia   Social History Main Topics  . Smoking status: Former Smoker    Types: Cigarettes    Quit date: 11/04/2010  . Smokeless tobacco: Never Used  . Alcohol use 0.0 oz/week     Comment: one mixed drink per week  . Drug use: No  . Sexual activity: Yes    Birth control/ protection: Surgical   Other Topics Concern  . Not on file   Social History Narrative   Married   Lives with spouse and daughter   Right handed.   Caffeine use: 2 cups  coffee in morning and 2 cups tea during the day     FAMILY HISTORY: Family History  Problem Relation Age of Onset  . Fibromyalgia Mother   . Diabetes type II Mother   . Depression Mother   . Alzheimer's disease Mother   . GER disease Mother   . Heart disease Mother   . Paranoid behavior Mother   . Dementia Mother   . Heart attack Father 31       MI x5  . Stroke Father        CVA x7  . Asthma Father   . Brain cancer Father   . Seizures Father   . Anxiety disorder Sister   . OCD Sister   . Sexual abuse Sister   . Physical abuse Sister   . Anxiety disorder Sister   . ADD / ADHD Daughter   .  ADD / ADHD Daughter   . Alcohol abuse Neg Hx   . Drug abuse Neg Hx   . Schizophrenia Neg Hx     Review of Systems  Constitutional: Positive for malaise/fatigue. Negative for chills and fever.  HENT: Negative.  Negative for hearing loss, sore throat and tinnitus.   Eyes: Negative.  Negative for blurred vision, photophobia and discharge.  Respiratory: Negative.  Negative for cough, hemoptysis, shortness of breath and wheezing.   Cardiovascular: Negative.  Negative for chest pain, palpitations, orthopnea, claudication and leg swelling.  Gastrointestinal: Negative.  Negative for abdominal pain, constipation, diarrhea, melena, nausea and vomiting.  Genitourinary: Negative.  Negative for dysuria and hematuria.  Musculoskeletal: Positive for myalgias. Negative for back pain and joint pain.  Skin: Negative.  Negative for itching and rash.  Neurological: Negative.  Negative for dizziness, weakness and headaches.  Endo/Heme/Allergies: Negative.  Negative for environmental allergies and polydipsia. Does not bruise/bleed easily.  Psychiatric/Behavioral: Negative.  Negative for depression. The patient is not nervous/anxious and does not have insomnia.   All other systems reviewed and are negative. 14 point review of systems was performed and is negative except as detailed under history of present illness and above   PHYSICAL EXAMINATION  ECOG PERFORMANCE STATUS: 0 - Asymptomatic  Vitals:   05/22/17 1028  BP: (!) 105/56  Pulse: 66  Resp: 16     Physical Exam  Constitutional: She is oriented to person, place, and time and well-developed, well-nourished, and in no distress. No distress.  Wears glasses. Pt was able to get on exam table without assistance.   HENT:  Head: Normocephalic and atraumatic.  Mouth/Throat: No oropharyngeal exudate.  Eyes: Conjunctivae and EOM are normal. Pupils are equal, round, and reactive to light. No scleral icterus.  Neck: Normal range of motion. Neck supple. No  JVD present.  Cardiovascular: Normal rate, regular rhythm and normal heart sounds.  Exam reveals no gallop and no friction rub.   No murmur heard. Pulmonary/Chest: Effort normal and breath sounds normal. No respiratory distress. She has no wheezes. She has no rales.  Abdominal: Soft. Bowel sounds are normal. She exhibits no distension. There is no tenderness. There is no guarding.  Musculoskeletal: Normal range of motion. She exhibits no edema or tenderness.  Lymphadenopathy:    She has no cervical adenopathy.  Neurological: She is alert and oriented to person, place, and time. No cranial nerve deficit. Gait normal.  Skin: Skin is warm and dry. No rash noted. No erythema. No pallor.  Psychiatric: Affect and judgment normal.  Nursing note and vitals reviewed.   LABORATORY DATA: I have  reviewed the data as listed. CBC    Component Value Date/Time   WBC 6.7 05/22/2017 0955   RBC 4.09 05/22/2017 0955   HGB 13.5 05/22/2017 0955   HCT 38.1 05/22/2017 0955   PLT 258 05/22/2017 0955   MCV 93.2 05/22/2017 0955   MCH 33.0 05/22/2017 0955   MCHC 35.4 05/22/2017 0955   RDW 12.3 05/22/2017 0955   LYMPHSABS 1.8 05/22/2017 0955   MONOABS 0.4 05/22/2017 0955   EOSABS 0.4 05/22/2017 0955   BASOSABS 0.1 05/22/2017 0955   CMP     Component Value Date/Time   NA 134 (L) 05/22/2017 0955   K 4.2 05/22/2017 0955   CL 99 (L) 05/22/2017 0955   CO2 26 05/22/2017 0955   GLUCOSE 296 (H) 05/22/2017 0955   BUN 11 05/22/2017 0955   CREATININE 1.02 (H) 05/22/2017 0955   CREATININE 0.88 02/21/2012 1153   CALCIUM 9.3 05/22/2017 0955   PROT 6.4 (L) 05/22/2017 0955   ALBUMIN 4.1 05/22/2017 0955   AST 33 05/22/2017 0955   ALT 37 05/22/2017 0955   ALKPHOS 34 (L) 05/22/2017 0955   BILITOT 0.7 05/22/2017 0955   GFRNONAA >60 05/22/2017 0955   GFRAA >60 05/22/2017 0955    ASSESSMENT and THERAPY PLAN:  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 She has not been getting frequent sinopulmonary infections. IgG is pending today. She will be notified of results when available.   She will continue to follow with Dr. Nevada Crane for her primary care issues. I will send over a copy of her CBC, CMP to Dr. Nevada Crane today. Laboratory studies reviewed.  She will return for a follow up in 6 months with labs.   All questions were answered. The patient knows to call the clinic with any problems, questions or concerns. We can certainly see the patient much sooner if necessary.   Orders Placed This Encounter  Procedures  . CBC with Differential    Standing Status:   Future    Standing Expiration Date:   05/22/2018  . Comprehensive metabolic panel    Standing Status:   Future    Standing Expiration Date:   05/22/2018  . IgG, IgA, IgM    Standing Status:   Future    Standing Expiration Date:   05/22/2018    Twana First, MD

## 2017-05-22 NOTE — Patient Instructions (Signed)
[facility] Discharge Instructions  RECOMMENDATIONS MADE BY THE CONSULTANT AND ANY TEST RESULTS WILL BE SENT TO YOUR REFERRING PHYSICIAN.  You were seen today by Dr. Zhou.   Thank you for choosing [facility] to provide your oncology and hematology care.  To afford each patient quality time with our provider, please arrive at least 15 minutes before your scheduled appointment time.    If you have a lab appointment with the Cancer Center please come in thru the  Main Entrance and check in at the main information desk  You need to re-schedule your appointment should you arrive 10 or more minutes late.  We strive to give you quality time with our providers, and arriving late affects you and other patients whose appointments are after yours.  Also, if you no show three or more times for appointments you may be dismissed from the clinic at the providers discretion.     Again, thank you for choosing [facility].  Our hope is that these requests will decrease the amount of time that you wait before being seen by our physicians.       _____________________________________________________________  Should you have questions after your visit to  Cancer Center, please contact our office at [phone] between the hours of 8:30 a.m. and 4:30 p.m.  Voicemails left after 4:30 p.m. will not be returned until the following business day.  For prescription refill requests, have your pharmacy contact our office.       Resources For Cancer Patients and their Caregivers ? American Cancer Society: Can assist with transportation, wigs, general needs, runs Look Good Feel Better.        1-888-227-6333 ? Cancer Care: Provides financial assistance, online support groups, medication/co-pay assistance.  1-800-813-HOPE (4673) ? [facility] Assists [place] cancer patients and their families  through emotional , educational and financial support.  336-427-4357 ? [place] DSS Where to apply for food stamps, Medicaid and utility assistance. 336-342-1394 ? [facility]: Transportation to medical appointments. 336-347-2287 ? Social Security Administration: May apply for disability if have a Stage IV cancer. 336-342-7796 1-800-772-1213 ? [place] Aging, Disability and Transit Services: Assists with nutrition, care and transit needs. 336-349-2343  Cancer Center Support Programs: @10RELATIVEDAYS@ > Cancer Support Group  2nd Tuesday of the month 1pm-2pm, Journey Room  > Creative Journey  3rd Tuesday of the month 1130am-1pm, Journey Room  > Look Good Feel Better  1st Wednesday of the month 10am-12 noon, Journey Room (Call American Cancer Society to register 1-800-395-5775)    

## 2017-05-23 LAB — IGG, IGA, IGM
IgA: 37 mg/dL — ABNORMAL LOW (ref 87–352)
IgG (Immunoglobin G), Serum: 520 mg/dL — ABNORMAL LOW (ref 700–1600)
IgM, Serum: 15 mg/dL — ABNORMAL LOW (ref 26–217)

## 2017-05-25 DIAGNOSIS — E559 Vitamin D deficiency, unspecified: Secondary | ICD-10-CM | POA: Diagnosis not present

## 2017-05-25 DIAGNOSIS — E119 Type 2 diabetes mellitus without complications: Secondary | ICD-10-CM | POA: Diagnosis not present

## 2017-05-25 DIAGNOSIS — E039 Hypothyroidism, unspecified: Secondary | ICD-10-CM | POA: Diagnosis not present

## 2017-05-25 DIAGNOSIS — D509 Iron deficiency anemia, unspecified: Secondary | ICD-10-CM | POA: Diagnosis not present

## 2017-05-29 ENCOUNTER — Ambulatory Visit (HOSPITAL_COMMUNITY): Payer: Self-pay | Admitting: Psychiatry

## 2017-05-29 DIAGNOSIS — E782 Mixed hyperlipidemia: Secondary | ICD-10-CM | POA: Diagnosis not present

## 2017-05-29 DIAGNOSIS — M797 Fibromyalgia: Secondary | ICD-10-CM | POA: Diagnosis not present

## 2017-05-29 DIAGNOSIS — E119 Type 2 diabetes mellitus without complications: Secondary | ICD-10-CM | POA: Diagnosis not present

## 2017-05-29 DIAGNOSIS — D509 Iron deficiency anemia, unspecified: Secondary | ICD-10-CM | POA: Diagnosis not present

## 2017-05-29 DIAGNOSIS — R5382 Chronic fatigue, unspecified: Secondary | ICD-10-CM | POA: Diagnosis not present

## 2017-05-29 DIAGNOSIS — R945 Abnormal results of liver function studies: Secondary | ICD-10-CM | POA: Diagnosis not present

## 2017-05-29 DIAGNOSIS — E6609 Other obesity due to excess calories: Secondary | ICD-10-CM | POA: Diagnosis not present

## 2017-05-29 DIAGNOSIS — M4800 Spinal stenosis, site unspecified: Secondary | ICD-10-CM | POA: Diagnosis not present

## 2017-05-29 DIAGNOSIS — J45909 Unspecified asthma, uncomplicated: Secondary | ICD-10-CM | POA: Diagnosis not present

## 2017-05-29 DIAGNOSIS — G894 Chronic pain syndrome: Secondary | ICD-10-CM | POA: Diagnosis not present

## 2017-06-16 ENCOUNTER — Telehealth (HOSPITAL_COMMUNITY): Payer: Self-pay | Admitting: *Deleted

## 2017-06-16 ENCOUNTER — Encounter (HOSPITAL_COMMUNITY): Payer: Self-pay | Admitting: *Deleted

## 2017-06-16 DIAGNOSIS — N390 Urinary tract infection, site not specified: Secondary | ICD-10-CM | POA: Diagnosis not present

## 2017-06-16 NOTE — Telephone Encounter (Signed)
I left a message on her answering machine. I requested a call back if she needs additional help

## 2017-06-16 NOTE — Telephone Encounter (Signed)
Voice message from patient, asked if it is safe to take medicine.

## 2017-06-27 ENCOUNTER — Encounter (HOSPITAL_COMMUNITY): Payer: Self-pay | Admitting: Psychiatry

## 2017-06-27 ENCOUNTER — Ambulatory Visit (INDEPENDENT_AMBULATORY_CARE_PROVIDER_SITE_OTHER): Payer: PPO | Admitting: Psychiatry

## 2017-06-27 VITALS — BP 99/61 | HR 74 | Ht 71.0 in | Wt 238.0 lb

## 2017-06-27 DIAGNOSIS — F332 Major depressive disorder, recurrent severe without psychotic features: Secondary | ICD-10-CM | POA: Diagnosis not present

## 2017-06-27 MED ORDER — BENZTROPINE MESYLATE 0.5 MG PO TABS: 0.5000 mg | ORAL_TABLET | Freq: Every day | ORAL | 2 refills | Status: DC

## 2017-06-27 MED ORDER — RISPERIDONE 1 MG PO TABS: 1.0000 mg | ORAL_TABLET | Freq: Every day | ORAL | 2 refills | Status: DC

## 2017-06-27 MED ORDER — FLUOXETINE HCL 40 MG PO CAPS: 40.0000 mg | ORAL_CAPSULE | Freq: Every day | ORAL | 2 refills | Status: DC

## 2017-06-27 MED ORDER — PRAZOSIN HCL 2 MG PO CAPS: 2.0000 mg | ORAL_CAPSULE | Freq: Every day | ORAL | 2 refills | Status: DC

## 2017-06-27 MED ORDER — CLONAZEPAM 0.5 MG PO TABS: 0.5000 mg | ORAL_TABLET | Freq: Two times a day (BID) | ORAL | 2 refills | Status: DC | PRN
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
## 2017-07-04 ENCOUNTER — Telehealth (HOSPITAL_COMMUNITY): Payer: Self-pay | Admitting: *Deleted

## 2017-07-04 MED ORDER — BENZTROPINE MESYLATE 0.5 MG PO TABS: 0.5000 mg | ORAL_TABLET | Freq: Every day | ORAL | 2 refills | Status: DC

## 2017-07-04 NOTE — Telephone Encounter (Signed)
[name] with [facility] called requesting 90 days supply for pt Cogentin. She stated they received pt medication but it was for 30 days supply 2 refills. Per [name], she would like to know if provider does 90 days supply if she wanted any refills on script as well. Pt medication was last filled on 06-27-2017 with 30 days 2 refills. Carol's number is [phone] and pharmacy number is [phone]. Per Dr. Harrington Challenger, You tell her 90 days is ok with 2 refills. Johnell Comings at [phone] and informed her with what provider stated and she verbalized understanding.

## 2017-07-04 NOTE — Telephone Encounter (Signed)
You tell her 90 days is ok with 2 refills

## 2017-07-04 NOTE — Telephone Encounter (Signed)
Spoke with [name] and informed her with what provider stated and she verbalized understanding.

## 2017-07-04 NOTE — Telephone Encounter (Signed)
[name] with [facility] called requesting 90 days supply for pt Cogentin. She stated they received pt medication but it was for 30 days supply 2 refills. Per [name], she would like to know if provider does 90 days supply if she wanted any refills on script as well. Pt medication was last filled on 06-27-2017 with 30 days 2 refills. Carol's number is [phone] and pharmacy number is [phone].

## 2017-09-27 ENCOUNTER — Ambulatory Visit (INDEPENDENT_AMBULATORY_CARE_PROVIDER_SITE_OTHER): Payer: PPO | Admitting: Psychiatry

## 2017-09-27 ENCOUNTER — Encounter (HOSPITAL_COMMUNITY): Payer: Self-pay | Admitting: Psychiatry

## 2017-09-27 VITALS — BP 116/54 | HR 65 | Wt 235.4 lb

## 2017-09-27 DIAGNOSIS — R5381 Other malaise: Secondary | ICD-10-CM

## 2017-09-27 DIAGNOSIS — G8929 Other chronic pain: Secondary | ICD-10-CM

## 2017-09-27 DIAGNOSIS — Z91411 Personal history of adult psychological abuse: Secondary | ICD-10-CM

## 2017-09-27 DIAGNOSIS — M797 Fibromyalgia: Secondary | ICD-10-CM | POA: Diagnosis not present

## 2017-09-27 DIAGNOSIS — Z818 Family history of other mental and behavioral disorders: Secondary | ICD-10-CM

## 2017-09-27 DIAGNOSIS — Z560 Unemployment, unspecified: Secondary | ICD-10-CM | POA: Diagnosis not present

## 2017-09-27 DIAGNOSIS — Z87891 Personal history of nicotine dependence: Secondary | ICD-10-CM | POA: Diagnosis not present

## 2017-09-27 DIAGNOSIS — R5383 Other fatigue: Secondary | ICD-10-CM | POA: Diagnosis not present

## 2017-09-27 DIAGNOSIS — F332 Major depressive disorder, recurrent severe without psychotic features: Secondary | ICD-10-CM

## 2017-09-27 DIAGNOSIS — Z810 Family history of intellectual disabilities: Secondary | ICD-10-CM | POA: Diagnosis not present

## 2017-09-27 MED ORDER — PRAZOSIN HCL 2 MG PO CAPS: 2.0000 mg | ORAL_CAPSULE | Freq: Every day | ORAL | 2 refills | Status: DC

## 2017-09-27 MED ORDER — BENZTROPINE MESYLATE 0.5 MG PO TABS: 0.5000 mg | ORAL_TABLET | Freq: Every day | ORAL | 2 refills | Status: DC

## 2017-09-27 MED ORDER — FLUOXETINE HCL 40 MG PO CAPS: 40.0000 mg | ORAL_CAPSULE | Freq: Every day | ORAL | 2 refills | Status: DC

## 2017-09-27 MED ORDER — CLONAZEPAM 0.5 MG PO TABS: 0.5000 mg | ORAL_TABLET | Freq: Two times a day (BID) | ORAL | 2 refills | Status: DC | PRN

## 2017-09-27 MED ORDER — RISPERIDONE 1 MG PO TABS: 1.0000 mg | ORAL_TABLET | Freq: Every day | ORAL | 2 refills | Status: DC
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
## 2017-10-09 DIAGNOSIS — M545 Low back pain: Secondary | ICD-10-CM | POA: Diagnosis not present

## 2017-10-09 DIAGNOSIS — R300 Dysuria: Secondary | ICD-10-CM | POA: Diagnosis not present

## 2017-11-21 ENCOUNTER — Other Ambulatory Visit (HOSPITAL_COMMUNITY): Payer: Self-pay

## 2017-11-21 ENCOUNTER — Ambulatory Visit (HOSPITAL_COMMUNITY): Payer: Self-pay

## 2017-11-30 DIAGNOSIS — E119 Type 2 diabetes mellitus without complications: Secondary | ICD-10-CM | POA: Diagnosis not present

## 2017-11-30 DIAGNOSIS — E039 Hypothyroidism, unspecified: Secondary | ICD-10-CM | POA: Diagnosis not present

## 2017-11-30 DIAGNOSIS — E782 Mixed hyperlipidemia: Secondary | ICD-10-CM | POA: Diagnosis not present

## 2017-11-30 DIAGNOSIS — M545 Low back pain: Secondary | ICD-10-CM | POA: Diagnosis not present

## 2017-11-30 DIAGNOSIS — E559 Vitamin D deficiency, unspecified: Secondary | ICD-10-CM | POA: Diagnosis not present

## 2017-11-30 DIAGNOSIS — D509 Iron deficiency anemia, unspecified: Secondary | ICD-10-CM | POA: Diagnosis not present

## 2017-12-01 DIAGNOSIS — E559 Vitamin D deficiency, unspecified: Secondary | ICD-10-CM | POA: Diagnosis not present

## 2017-12-01 DIAGNOSIS — K649 Unspecified hemorrhoids: Secondary | ICD-10-CM | POA: Diagnosis not present

## 2017-12-01 DIAGNOSIS — Z0001 Encounter for general adult medical examination with abnormal findings: Secondary | ICD-10-CM | POA: Diagnosis not present

## 2017-12-01 DIAGNOSIS — E782 Mixed hyperlipidemia: Secondary | ICD-10-CM | POA: Diagnosis not present

## 2017-12-01 DIAGNOSIS — K5900 Constipation, unspecified: Secondary | ICD-10-CM | POA: Diagnosis not present

## 2017-12-01 DIAGNOSIS — F419 Anxiety disorder, unspecified: Secondary | ICD-10-CM | POA: Diagnosis not present

## 2017-12-01 DIAGNOSIS — R7301 Impaired fasting glucose: Secondary | ICD-10-CM | POA: Diagnosis not present

## 2017-12-01 DIAGNOSIS — Z01419 Encounter for gynecological examination (general) (routine) without abnormal findings: Secondary | ICD-10-CM | POA: Diagnosis not present

## 2017-12-01 DIAGNOSIS — Z124 Encounter for screening for malignant neoplasm of cervix: Secondary | ICD-10-CM | POA: Diagnosis not present

## 2017-12-01 DIAGNOSIS — Z6834 Body mass index (BMI) 34.0-34.9, adult: Secondary | ICD-10-CM | POA: Diagnosis not present

## 2017-12-01 DIAGNOSIS — Z0489 Encounter for examination and observation for other specified reasons: Secondary | ICD-10-CM | POA: Diagnosis not present

## 2017-12-01 DIAGNOSIS — F339 Major depressive disorder, recurrent, unspecified: Secondary | ICD-10-CM | POA: Diagnosis not present

## 2017-12-13 ENCOUNTER — Other Ambulatory Visit: Payer: Self-pay

## 2017-12-13 ENCOUNTER — Inpatient Hospital Stay (HOSPITAL_BASED_OUTPATIENT_CLINIC_OR_DEPARTMENT_OTHER): Payer: PPO | Admitting: Oncology

## 2017-12-13 ENCOUNTER — Encounter (HOSPITAL_COMMUNITY): Payer: Self-pay | Admitting: Oncology

## 2017-12-13 ENCOUNTER — Inpatient Hospital Stay (HOSPITAL_COMMUNITY): Payer: PPO | Attending: Oncology

## 2017-12-13 VITALS — BP 98/59 | HR 60 | Temp 98.3°F | Resp 16 | Ht 71.0 in | Wt 234.0 lb

## 2017-12-13 DIAGNOSIS — D839 Common variable immunodeficiency, unspecified: Secondary | ICD-10-CM | POA: Diagnosis not present

## 2017-12-13 DIAGNOSIS — M797 Fibromyalgia: Secondary | ICD-10-CM

## 2017-12-13 DIAGNOSIS — Z79899 Other long term (current) drug therapy: Secondary | ICD-10-CM

## 2017-12-13 DIAGNOSIS — K219 Gastro-esophageal reflux disease without esophagitis: Secondary | ICD-10-CM | POA: Diagnosis not present

## 2017-12-13 DIAGNOSIS — G4730 Sleep apnea, unspecified: Secondary | ICD-10-CM | POA: Insufficient documentation

## 2017-12-13 DIAGNOSIS — F319 Bipolar disorder, unspecified: Secondary | ICD-10-CM | POA: Insufficient documentation

## 2017-12-13 DIAGNOSIS — I10 Essential (primary) hypertension: Secondary | ICD-10-CM | POA: Diagnosis not present

## 2017-12-13 DIAGNOSIS — G629 Polyneuropathy, unspecified: Secondary | ICD-10-CM | POA: Insufficient documentation

## 2017-12-13 DIAGNOSIS — Z87891 Personal history of nicotine dependence: Secondary | ICD-10-CM | POA: Diagnosis not present

## 2017-12-13 DIAGNOSIS — E559 Vitamin D deficiency, unspecified: Secondary | ICD-10-CM | POA: Diagnosis not present

## 2017-12-13 DIAGNOSIS — E782 Mixed hyperlipidemia: Secondary | ICD-10-CM | POA: Insufficient documentation

## 2017-12-13 DIAGNOSIS — E1140 Type 2 diabetes mellitus with diabetic neuropathy, unspecified: Secondary | ICD-10-CM | POA: Diagnosis not present

## 2017-12-13 DIAGNOSIS — K589 Irritable bowel syndrome without diarrhea: Secondary | ICD-10-CM | POA: Insufficient documentation

## 2017-12-13 DIAGNOSIS — J45909 Unspecified asthma, uncomplicated: Secondary | ICD-10-CM | POA: Insufficient documentation

## 2017-12-13 DIAGNOSIS — Z8673 Personal history of transient ischemic attack (TIA), and cerebral infarction without residual deficits: Secondary | ICD-10-CM | POA: Diagnosis not present

## 2017-12-13 DIAGNOSIS — F4310 Post-traumatic stress disorder, unspecified: Secondary | ICD-10-CM | POA: Diagnosis not present

## 2017-12-13 LAB — CBC WITH DIFFERENTIAL/PLATELET
BASOS PCT: 1 %
Basophils Absolute: 0.1 10*3/uL (ref 0.0–0.1)
Eosinophils Absolute: 0.3 10*3/uL (ref 0.0–0.7)
Eosinophils Relative: 4 %
HEMATOCRIT: 38.3 % (ref 36.0–46.0)
HEMOGLOBIN: 13.3 g/dL (ref 12.0–15.0)
LYMPHS ABS: 2 10*3/uL (ref 0.7–4.0)
Lymphocytes Relative: 24 %
MCH: 32.8 pg (ref 26.0–34.0)
MCHC: 34.7 g/dL (ref 30.0–36.0)
MCV: 94.6 fL (ref 78.0–100.0)
MONOS PCT: 6 %
Monocytes Absolute: 0.5 10*3/uL (ref 0.1–1.0)
NEUTROS ABS: 5.5 10*3/uL (ref 1.7–7.7)
NEUTROS PCT: 65 %
Platelets: 323 10*3/uL (ref 150–400)
RBC: 4.05 MIL/uL (ref 3.87–5.11)
RDW: 12.2 % (ref 11.5–15.5)
WBC: 8.4 10*3/uL (ref 4.0–10.5)

## 2017-12-13 LAB — COMPREHENSIVE METABOLIC PANEL
ALBUMIN: 4.4 g/dL (ref 3.5–5.0)
ALK PHOS: 39 U/L (ref 38–126)
ALT: 62 U/L — AB (ref 14–54)
ANION GAP: 8 (ref 5–15)
AST: 70 U/L — ABNORMAL HIGH (ref 15–41)
BILIRUBIN TOTAL: 0.4 mg/dL (ref 0.3–1.2)
BUN: 14 mg/dL (ref 6–20)
CALCIUM: 9.9 mg/dL (ref 8.9–10.3)
CO2: 27 mmol/L (ref 22–32)
CREATININE: 0.95 mg/dL (ref 0.44–1.00)
Chloride: 105 mmol/L (ref 101–111)
GFR calc Af Amer: >60 mL/min (ref >60)
GFR calc non Af Amer: >60 mL/min (ref >60)
GLUCOSE: 153 mg/dL — AB (ref 65–99)
Potassium: 4.8 mmol/L (ref 3.5–5.1)
Sodium: 140 mmol/L (ref 135–145)
TOTAL PROTEIN: 6.9 g/dL (ref 6.5–8.1)

## 2017-12-13 NOTE — Progress Notes (Signed)
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 female returns for follow-up of her common variable immunodeficiency.    Patient presents today for continued follow-up unaccompanied.  She states she has been doing well since her last visit.  She does complain of mild fatigue, constipation, numbness and burning in bilateral lower extremities and 4 out of 10 pain all over due to fibromyalgia. Otherwise she has no complaints today.  Her appetite is 100% energy levels of 75%.  She denies any sino-respiratory complaints.  Patient received IVIG last on 07/06/2015 and has not required it since.  Unfortunately she was not able to continue IVIG due to a very expensive co-pay of $800 per month.   MEDICAL HISTORY: Past Medical History:  Diagnosis Date  . Allergic rhinitis   . Anemia   . Arthritis    Lower back and hips  . Asthma   . Bipolar disorder ([place])   . 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[place])   . Constipation   . 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[place])   . Depression   . Essential hypertension, benign   . Fibromyalgia   . GERD (gastroesophageal reflux disease)   . H/O sleep apnea   . Hip pain, left   . History of palpitations    Negative Holter monitor  . History of pneumonia 1988  . Hyperlipidemia   . IBS (irritable bowel syndrome)   . Neck pain   . Poor short term memory   . PTSD (post-traumatic stress disorder)   . S/P Botox injection 11/2014    For migraine headaches  . Type 2 diabetes mellitus ([place])   . Urgency of urination     has Unspecified vitamin D deficiency; Mixed hyperlipidemia; Morbid obesity ([place]); BIPOLAR AFFECTIVE DISORDER; Hx of tobacco use, presenting hazards to health; DEPRESSION; Essential hypertension, benign; IBS;  FIBROMYALGIA; Palpitations; Neuropathy of upper extremity; History of stroke in prior 3 months; Headache(784.0); Cervicalgia; Acute confusional state; 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[place]); Migraine; and Neck pain of over 3 months duration on their problem list.     No history exists.     is allergic to sulfonamide derivatives; gluten meal; molds & smuts; clonazepam; dust mite extract; and tegretol [carbamazepine].  Michelle Kettle. Clements had no medications administered during this visit.  SURGICAL HISTORY: Past Surgical History:  Procedure Laterality Date  . CARPAL TUNNEL RELEASE Right 06/10/2013   Procedure: CARPAL TUNNEL RELEASE;  Surgeon: Faythe Ghee, MD;  Location: [facility];  Service: Neurosurgery;  Laterality: Right;  Right Carpal Tunnel Release   . CHOLECYSTECTOMY    . DENTAL SURGERY  12/2014  . mole exc     x 3  . MUSCLE BIOPSY  2008   Right leg  . TUBAL LIGATION      SOCIAL HISTORY: Social History   Socioeconomic History  . Marital status: Married    Spouse name: Not on file  . Number of children: 2  . Years of education: College  . Highest education level: Not on file  Social Needs  . Financial resource strain: Not on file  . Food insecurity - worry: Not on file  . Food insecurity - inability: Not on file  . Transportation needs - medical: Not on file  . Transportation needs - non-medical: Not on file  Occupational  History  . 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 Smoking status: Former Smoker    Types: Cigarettes    Last attempt to quit: 11/04/2010    Years since quitting: 7.1  . Smokeless tobacco: Never Used  Substance and Sexual Activity  . Alcohol use: Yes    Alcohol/week: 0.0 oz    Comment: one mixed drink per week  . Drug use: No  . Sexual activity: Yes    Birth control/protection: Surgical  Other Topics Concern  . Not on file  Social History Narrative   Married    Lives with spouse and daughter   Right handed.   Caffeine use: 2 cups coffee in morning and 2 cups tea during the day     FAMILY HISTORY: Family History  Problem Relation Age of Onset  . Fibromyalgia Mother   . Diabetes type II Mother   . Depression Mother   . Alzheimer's disease Mother   . GER disease Mother   . Heart disease Mother   . Paranoid behavior Mother   . Dementia Mother   . Heart attack Father 31       MI x5  . Stroke Father        CVA x7  . Asthma Father   . Brain cancer Father   . Seizures Father   . Anxiety disorder Sister   . OCD Sister   . Sexual abuse Sister   . Physical abuse Sister   . Anxiety disorder Sister   . ADD / ADHD Daughter   . ADD / ADHD Daughter   . Alcohol abuse Neg Hx   . Drug abuse Neg Hx   . Schizophrenia Neg Hx     Review of Systems  Constitutional: Positive for malaise/fatigue. Negative for chills, diaphoresis and fever.  HENT: Negative.  Negative for hearing loss, sore throat and tinnitus.   Eyes: Negative.  Negative for blurred vision, photophobia and discharge.  Respiratory: Negative.  Negative for cough, hemoptysis, shortness of breath and wheezing.   Cardiovascular: Negative.  Negative for chest pain, palpitations, orthopnea, claudication and leg swelling.  Gastrointestinal: Positive for constipation. Negative for abdominal pain, diarrhea, melena, nausea and vomiting.  Genitourinary: Negative.  Negative for dysuria and hematuria.  Musculoskeletal: Positive for myalgias. Negative for back pain and joint pain.  Skin: Negative.  Negative for itching and rash.  Neurological: Positive for sensory change (Numbness and burning in feet). Negative for dizziness, weakness and headaches.  Endo/Heme/Allergies: Negative.  Negative for environmental allergies and polydipsia. Does not bruise/bleed easily.  Psychiatric/Behavioral: Negative.  Negative for depression. The patient is not nervous/anxious and does not have insomnia.   All other  systems reviewed and are negative. 14 point review of systems was performed and is negative except as detailed under history of present illness and above   PHYSICAL EXAMINATION  ECOG PERFORMANCE STATUS: 0 - Asymptomatic  Vitals:   12/13/17 1559  BP: (!) 98/59  Pulse: 60  Resp: 16  Temp: 98.3 F (36.8 C)  SpO2: 92%     Physical Exam  Constitutional: She is oriented to person, place, and time and well-developed, well-nourished, and in no distress. No distress.  Wears glasses. Pt was able to get on exam table without assistance.   HENT:  Head: Normocephalic and atraumatic.  Mouth/Throat: No oropharyngeal exudate.  Eyes: Conjunctivae and EOM are normal. Pupils are equal, round, and reactive to light.  No scleral icterus.  Neck: Normal range of motion. Neck supple. No JVD present.  Cardiovascular: Normal rate, regular rhythm and normal heart sounds. Exam reveals no gallop and no friction rub.  No murmur heard. Pulmonary/Chest: Effort normal and breath sounds normal. No respiratory distress. She has no wheezes. She has no rales.  Abdominal: Soft. Bowel sounds are normal. She exhibits no distension. There is no tenderness. There is no guarding.  Musculoskeletal: Normal range of motion. She exhibits no edema or tenderness.  Lymphadenopathy:    She has no cervical adenopathy.  Neurological: She is alert and oriented to person, place, and time. No cranial nerve deficit. Gait normal.  Skin: Skin is warm and dry. No rash noted. No erythema. No pallor.  Psychiatric: Affect and judgment normal.  Nursing note and vitals reviewed.   LABORATORY DATA: I have reviewed the data as listed. CBC    Component Value Date/Time   WBC 8.4 12/13/2017 1514   RBC 4.05 12/13/2017 1514   HGB 13.3 12/13/2017 1514   HCT 38.3 12/13/2017 1514   PLT 323 12/13/2017 1514   MCV 94.6 12/13/2017 1514   MCH 32.8 12/13/2017 1514   MCHC 34.7 12/13/2017 1514   RDW 12.2 12/13/2017 1514   LYMPHSABS 2.0  12/13/2017 1514   MONOABS 0.5 12/13/2017 1514   EOSABS 0.3 12/13/2017 1514   BASOSABS 0.1 12/13/2017 1514   CMP     Component Value Date/Time   NA 140 12/13/2017 1514   K 4.8 12/13/2017 1514   CL 105 12/13/2017 1514   CO2 27 12/13/2017 1514   GLUCOSE 153 (H) 12/13/2017 1514   BUN 14 12/13/2017 1514   CREATININE 0.95 12/13/2017 1514   CREATININE 0.88 02/21/2012 1153   CALCIUM 9.9 12/13/2017 1514   PROT 6.9 12/13/2017 1514   ALBUMIN 4.4 12/13/2017 1514   AST 70 (H) 12/13/2017 1514   ALT 62 (H) 12/13/2017 1514   ALKPHOS 39 12/13/2017 1514   BILITOT 0.4 12/13/2017 1514   GFRNONAA >60 12/13/2017 1514   GFRAA >60 12/13/2017 1514    ASSESSMENT and THERAPY PLAN:  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  She denies getting frequent sinopulmonary infections.  IgG is 511 today.  Labs continue to be stable.  Patient's liver enzymes are slightly elevated.  AST 70, ALT 62 today.  We will continue to monitor this.  Patient has questions regarding her 64 year old daughter and is asking if she can be referred to our clinic.  She explained that her daughters PCP will not draw her IgG, IgA or IgM due to how expensive the test are.  Encouraged patient to have her daughter's PCP refer her to our clinic and we can potentially work her up since there is a genetic component to this disease.   She will return for a follow up in 6 months with labs (CBC, CMET, Immunoglobins).   All questions were answered. The patient knows to call the clinic with any problems, questions or concerns. We can certainly see the patient much sooner if necessary.   No orders of the defined types were placed in this encounter.  Greater than 50% was spent in counseling and coordination of care with this patient including but not limited to discussion of the relevant topics above (See  A&P) including, but not limited to diagnosis and management of acute and chronic medical conditions.   Faythe Casa, NP 12/19/2017 11:43 AM

## 2017-12-14 LAB — IGG, IGA, IGM
IGA: 35 mg/dL — AB (ref 87–352)
IGG (IMMUNOGLOBIN G), SERUM: 511 mg/dL — AB (ref 700–1600)
IgM (Immunoglobulin M), Srm: 18 mg/dL — ABNORMAL LOW (ref 26–217)

## 2017-12-28 ENCOUNTER — Ambulatory Visit (HOSPITAL_COMMUNITY): Payer: Self-pay | Admitting: Psychiatry

## 2017-12-29 ENCOUNTER — Other Ambulatory Visit (HOSPITAL_COMMUNITY): Payer: Self-pay | Admitting: Hematology & Oncology

## 2017-12-29 ENCOUNTER — Other Ambulatory Visit (HOSPITAL_COMMUNITY): Payer: Self-pay | Admitting: Internal Medicine

## 2017-12-29 DIAGNOSIS — Z1239 Encounter for other screening for malignant neoplasm of breast: Secondary | ICD-10-CM

## 2018-01-01 ENCOUNTER — Ambulatory Visit (HOSPITAL_COMMUNITY): Payer: PPO | Admitting: Psychiatry

## 2018-01-01 ENCOUNTER — Encounter (HOSPITAL_COMMUNITY): Payer: Self-pay | Admitting: Psychiatry

## 2018-01-01 VITALS — BP 98/66 | HR 75 | Ht 71.0 in | Wt 229.0 lb

## 2018-01-01 DIAGNOSIS — M797 Fibromyalgia: Secondary | ICD-10-CM

## 2018-01-01 DIAGNOSIS — G894 Chronic pain syndrome: Secondary | ICD-10-CM

## 2018-01-01 DIAGNOSIS — Z560 Unemployment, unspecified: Secondary | ICD-10-CM

## 2018-01-01 DIAGNOSIS — Z810 Family history of intellectual disabilities: Secondary | ICD-10-CM | POA: Diagnosis not present

## 2018-01-01 DIAGNOSIS — F332 Major depressive disorder, recurrent severe without psychotic features: Secondary | ICD-10-CM | POA: Diagnosis not present

## 2018-01-01 DIAGNOSIS — Z87891 Personal history of nicotine dependence: Secondary | ICD-10-CM

## 2018-01-01 DIAGNOSIS — Z91411 Personal history of adult psychological abuse: Secondary | ICD-10-CM

## 2018-01-01 DIAGNOSIS — Z62811 Personal history of psychological abuse in childhood: Secondary | ICD-10-CM

## 2018-01-01 DIAGNOSIS — Z818 Family history of other mental and behavioral disorders: Secondary | ICD-10-CM | POA: Diagnosis not present

## 2018-01-01 MED ORDER — CLONAZEPAM 0.5 MG PO TABS: 0.5000 mg | ORAL_TABLET | Freq: Two times a day (BID) | ORAL | 1 refills | Status: DC | PRN

## 2018-01-01 MED ORDER — PRAZOSIN HCL 2 MG PO CAPS: 2.0000 mg | ORAL_CAPSULE | Freq: Every day | ORAL | 2 refills | Status: DC

## 2018-01-01 MED ORDER — FLUOXETINE HCL 40 MG PO CAPS: 40.0000 mg | ORAL_CAPSULE | Freq: Every day | ORAL | 2 refills | Status: DC

## 2018-01-01 MED ORDER — BENZTROPINE MESYLATE 0.5 MG PO TABS: 0.5000 mg | ORAL_TABLET | Freq: Every day | ORAL | 2 refills | Status: DC

## 2018-01-01 MED ORDER — RISPERIDONE 1 MG PO TABS: 1.0000 mg | ORAL_TABLET | Freq: Every day | ORAL | 2 refills | Status: DC
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
## 2018-01-04 ENCOUNTER — Ambulatory Visit (HOSPITAL_COMMUNITY)
Admission: RE | Admit: 2018-01-04 | Discharge: 2018-01-04 | Disposition: A | Discharge status: Home / Self Care | Payer: PPO | Source: Ambulatory Visit | Attending: Internal Medicine | Admitting: Internal Medicine

## 2018-01-04 ENCOUNTER — Encounter (HOSPITAL_COMMUNITY): Payer: Self-pay

## 2018-01-04 DIAGNOSIS — Z1231 Encounter for screening mammogram for malignant neoplasm of breast: Secondary | ICD-10-CM | POA: Insufficient documentation

## 2018-01-04 DIAGNOSIS — Z1239 Encounter for other screening for malignant neoplasm of breast: Secondary | ICD-10-CM

## 2018-01-05 DIAGNOSIS — M25552 Pain in left hip: Secondary | ICD-10-CM | POA: Diagnosis not present

## 2018-01-10 DIAGNOSIS — J019 Acute sinusitis, unspecified: Secondary | ICD-10-CM | POA: Diagnosis not present

## 2018-01-10 DIAGNOSIS — Z6834 Body mass index (BMI) 34.0-34.9, adult: Secondary | ICD-10-CM | POA: Diagnosis not present

## 2018-01-22 DIAGNOSIS — M545 Low back pain: Secondary | ICD-10-CM | POA: Diagnosis not present

## 2018-01-22 DIAGNOSIS — Z6832 Body mass index (BMI) 32.0-32.9, adult: Secondary | ICD-10-CM | POA: Diagnosis not present

## 2018-01-22 DIAGNOSIS — B379 Candidiasis, unspecified: Secondary | ICD-10-CM | POA: Diagnosis not present

## 2018-02-13 DIAGNOSIS — M545 Low back pain: Secondary | ICD-10-CM | POA: Diagnosis not present

## 2018-02-13 DIAGNOSIS — M7062 Trochanteric bursitis, left hip: Secondary | ICD-10-CM | POA: Diagnosis not present

## 2018-02-13 DIAGNOSIS — M25552 Pain in left hip: Secondary | ICD-10-CM | POA: Diagnosis not present

## 2018-02-17 DIAGNOSIS — I10 Essential (primary) hypertension: Secondary | ICD-10-CM | POA: Diagnosis not present

## 2018-02-17 DIAGNOSIS — E559 Vitamin D deficiency, unspecified: Secondary | ICD-10-CM | POA: Diagnosis not present

## 2018-02-17 DIAGNOSIS — E039 Hypothyroidism, unspecified: Secondary | ICD-10-CM | POA: Diagnosis not present

## 2018-02-17 DIAGNOSIS — E782 Mixed hyperlipidemia: Secondary | ICD-10-CM | POA: Diagnosis not present

## 2018-02-17 DIAGNOSIS — E119 Type 2 diabetes mellitus without complications: Secondary | ICD-10-CM | POA: Diagnosis not present

## 2018-02-21 DIAGNOSIS — R5382 Chronic fatigue, unspecified: Secondary | ICD-10-CM | POA: Diagnosis not present

## 2018-02-21 DIAGNOSIS — M797 Fibromyalgia: Secondary | ICD-10-CM | POA: Diagnosis not present

## 2018-02-21 DIAGNOSIS — F419 Anxiety disorder, unspecified: Secondary | ICD-10-CM | POA: Diagnosis not present

## 2018-02-21 DIAGNOSIS — F319 Bipolar disorder, unspecified: Secondary | ICD-10-CM | POA: Diagnosis not present

## 2018-02-21 DIAGNOSIS — G894 Chronic pain syndrome: Secondary | ICD-10-CM | POA: Diagnosis not present

## 2018-02-21 DIAGNOSIS — M545 Low back pain: Secondary | ICD-10-CM | POA: Diagnosis not present

## 2018-02-21 DIAGNOSIS — D509 Iron deficiency anemia, unspecified: Secondary | ICD-10-CM | POA: Diagnosis not present

## 2018-02-21 DIAGNOSIS — D839 Common variable immunodeficiency, unspecified: Secondary | ICD-10-CM | POA: Diagnosis not present

## 2018-02-21 DIAGNOSIS — M4800 Spinal stenosis, site unspecified: Secondary | ICD-10-CM | POA: Diagnosis not present

## 2018-02-21 DIAGNOSIS — G589 Mononeuropathy, unspecified: Secondary | ICD-10-CM | POA: Diagnosis not present

## 2018-02-21 DIAGNOSIS — F339 Major depressive disorder, recurrent, unspecified: Secondary | ICD-10-CM | POA: Diagnosis not present

## 2018-02-21 DIAGNOSIS — R945 Abnormal results of liver function studies: Secondary | ICD-10-CM | POA: Diagnosis not present

## 2018-02-21 DIAGNOSIS — G4733 Obstructive sleep apnea (adult) (pediatric): Secondary | ICD-10-CM | POA: Diagnosis not present

## 2018-02-21 DIAGNOSIS — E559 Vitamin D deficiency, unspecified: Secondary | ICD-10-CM | POA: Diagnosis not present

## 2018-02-21 DIAGNOSIS — J45909 Unspecified asthma, uncomplicated: Secondary | ICD-10-CM | POA: Diagnosis not present

## 2018-02-21 DIAGNOSIS — E6609 Other obesity due to excess calories: Secondary | ICD-10-CM | POA: Diagnosis not present

## 2018-02-21 DIAGNOSIS — F514 Sleep terrors [night terrors]: Secondary | ICD-10-CM | POA: Diagnosis not present

## 2018-02-21 DIAGNOSIS — N390 Urinary tract infection, site not specified: Secondary | ICD-10-CM | POA: Diagnosis not present

## 2018-02-21 DIAGNOSIS — E782 Mixed hyperlipidemia: Secondary | ICD-10-CM | POA: Diagnosis not present

## 2018-02-21 DIAGNOSIS — E119 Type 2 diabetes mellitus without complications: Secondary | ICD-10-CM | POA: Diagnosis not present

## 2018-02-21 DIAGNOSIS — Z6831 Body mass index (BMI) 31.0-31.9, adult: Secondary | ICD-10-CM | POA: Diagnosis not present

## 2018-02-22 DIAGNOSIS — M7062 Trochanteric bursitis, left hip: Secondary | ICD-10-CM | POA: Diagnosis not present

## 2018-02-27 DIAGNOSIS — M7062 Trochanteric bursitis, left hip: Secondary | ICD-10-CM | POA: Diagnosis not present

## 2018-03-01 DIAGNOSIS — M7062 Trochanteric bursitis, left hip: Secondary | ICD-10-CM | POA: Diagnosis not present

## 2018-03-03 DIAGNOSIS — M533 Sacrococcygeal disorders, not elsewhere classified: Secondary | ICD-10-CM | POA: Diagnosis not present

## 2018-03-03 DIAGNOSIS — G894 Chronic pain syndrome: Secondary | ICD-10-CM | POA: Diagnosis not present

## 2018-03-06 DIAGNOSIS — M7062 Trochanteric bursitis, left hip: Secondary | ICD-10-CM | POA: Diagnosis not present

## 2018-03-08 DIAGNOSIS — M7062 Trochanteric bursitis, left hip: Secondary | ICD-10-CM | POA: Diagnosis not present

## 2018-03-13 DIAGNOSIS — M7062 Trochanteric bursitis, left hip: Secondary | ICD-10-CM | POA: Diagnosis not present

## 2018-03-20 DIAGNOSIS — M7062 Trochanteric bursitis, left hip: Secondary | ICD-10-CM | POA: Diagnosis not present

## 2018-03-22 DIAGNOSIS — M7062 Trochanteric bursitis, left hip: Secondary | ICD-10-CM | POA: Diagnosis not present

## 2018-03-27 DIAGNOSIS — M7062 Trochanteric bursitis, left hip: Secondary | ICD-10-CM | POA: Diagnosis not present

## 2018-03-28 DIAGNOSIS — M7062 Trochanteric bursitis, left hip: Secondary | ICD-10-CM | POA: Diagnosis not present

## 2018-03-30 DIAGNOSIS — M7062 Trochanteric bursitis, left hip: Secondary | ICD-10-CM | POA: Diagnosis not present

## 2018-04-04 ENCOUNTER — Other Ambulatory Visit (HOSPITAL_COMMUNITY): Payer: Self-pay

## 2018-04-04 DIAGNOSIS — M7062 Trochanteric bursitis, left hip: Secondary | ICD-10-CM | POA: Diagnosis not present

## 2018-04-04 DIAGNOSIS — D839 Common variable immunodeficiency, unspecified: Secondary | ICD-10-CM

## 2018-04-05 ENCOUNTER — Inpatient Hospital Stay (HOSPITAL_COMMUNITY): Payer: PPO | Attending: Hematology

## 2018-04-05 DIAGNOSIS — Z79899 Other long term (current) drug therapy: Secondary | ICD-10-CM | POA: Insufficient documentation

## 2018-04-05 DIAGNOSIS — D839 Common variable immunodeficiency, unspecified: Secondary | ICD-10-CM | POA: Diagnosis not present

## 2018-04-05 DIAGNOSIS — N951 Menopausal and female climacteric states: Secondary | ICD-10-CM | POA: Diagnosis not present

## 2018-04-05 LAB — COMPREHENSIVE METABOLIC PANEL
ALBUMIN: 4.4 g/dL (ref 3.5–5.0)
ALT: 48 U/L (ref 14–54)
AST: 45 U/L — AB (ref 15–41)
Alkaline Phosphatase: 27 U/L — ABNORMAL LOW (ref 38–126)
Anion gap: 10 (ref 5–15)
BUN: 17 mg/dL (ref 6–20)
CHLORIDE: 102 mmol/L (ref 101–111)
CO2: 26 mmol/L (ref 22–32)
CREATININE: 1.06 mg/dL — AB (ref 0.44–1.00)
Calcium: 9.5 mg/dL (ref 8.9–10.3)
GFR calc Af Amer: >60 mL/min (ref >60)
GFR calc non Af Amer: >60 mL/min (ref >60)
GLUCOSE: 121 mg/dL — AB (ref 65–99)
POTASSIUM: 4.4 mmol/L (ref 3.5–5.1)
Sodium: 138 mmol/L (ref 135–145)
Total Bilirubin: 1 mg/dL (ref 0.3–1.2)
Total Protein: 6.9 g/dL (ref 6.5–8.1)

## 2018-04-05 LAB — CBC WITH DIFFERENTIAL/PLATELET
BASOS ABS: 0.1 10*3/uL (ref 0.0–0.1)
BASOS PCT: 1 %
Eosinophils Absolute: 0.4 10*3/uL (ref 0.0–0.7)
Eosinophils Relative: 5 %
HEMATOCRIT: 39.7 % (ref 36.0–46.0)
Hemoglobin: 13.8 g/dL (ref 12.0–15.0)
LYMPHS PCT: 31 %
Lymphs Abs: 2 10*3/uL (ref 0.7–4.0)
MCH: 33.7 pg (ref 26.0–34.0)
MCHC: 34.8 g/dL (ref 30.0–36.0)
MCV: 96.8 fL (ref 78.0–100.0)
MONO ABS: 0.5 10*3/uL (ref 0.1–1.0)
Monocytes Relative: 7 %
NEUTROS ABS: 3.7 10*3/uL (ref 1.7–7.7)
Neutrophils Relative %: 56 %
PLATELETS: 280 10*3/uL (ref 150–400)
RBC: 4.1 MIL/uL (ref 3.87–5.11)
RDW: 12.3 % (ref 11.5–15.5)
WBC: 6.5 10*3/uL (ref 4.0–10.5)

## 2018-04-06 LAB — IGG, IGA, IGM
IGA: 34 mg/dL — AB (ref 87–352)
IGG (IMMUNOGLOBIN G), SERUM: 556 mg/dL — AB (ref 700–1600)
IGM (IMMUNOGLOBULIN M), SRM: 14 mg/dL — AB (ref 26–217)

## 2018-04-12 ENCOUNTER — Encounter (HOSPITAL_COMMUNITY): Payer: Self-pay | Admitting: Hematology

## 2018-04-12 ENCOUNTER — Inpatient Hospital Stay (HOSPITAL_BASED_OUTPATIENT_CLINIC_OR_DEPARTMENT_OTHER): Payer: PPO | Admitting: Hematology

## 2018-04-12 ENCOUNTER — Other Ambulatory Visit: Payer: Self-pay

## 2018-04-12 VITALS — BP 101/45 | HR 71 | Temp 98.4°F | Resp 18 | Wt 230.0 lb

## 2018-04-12 DIAGNOSIS — N951 Menopausal and female climacteric states: Secondary | ICD-10-CM

## 2018-04-12 DIAGNOSIS — D839 Common variable immunodeficiency, unspecified: Secondary | ICD-10-CM | POA: Diagnosis not present

## 2018-04-12 DIAGNOSIS — Z79899 Other long term (current) drug therapy: Secondary | ICD-10-CM

## 2018-04-12 NOTE — Assessment & Plan Note (Signed)
1.  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  This is due to financial reasons based on record. - IgG levels are 556.  She does not report any sinopulmonary infections in the last 3 months.  She had UTI couple of times for which she was an antibiotic.  She denies any fevers, night sweats.  She has minor weight loss as she quit drinking sweet tea.  She does have occasional hot flashes.  We will continue to monitor her closely.  She will come back in 4 months with repeat IgG levels.

## 2018-04-12 NOTE — Progress Notes (Signed)
[facility] [address] [place] [address]   CLINIC:  Medical Oncology/Hematology  PCP:  [name], MD [place] [address] [phone]   REASON FOR VISIT:  Follow-up for variable immunodeficiency.  CURRENT THERAPY: On no therapy.   INTERVAL HISTORY:  Ms. Runner 52 y.o. female returns for routine follow-up of common variable immunodeficiency.  She is not on any IVIG.  She does not report any recurrent sinopulmonary infections.  However she had 2 episodes of UTI.  She has occasional hot flashes but denied any night sweats or fevers.  She lost few pounds but quit drinking sweet tea.  She has not been receiving IVIG because of financial reasons.  She has generalized pain on and off from fibromyalgia.  She is accompanied by her daughter today.  REVIEW OF SYSTEMS:  Review of Systems  Constitutional: Positive for fatigue.  Endocrine: Positive for hot flashes.  Musculoskeletal: Positive for myalgias.  All other systems reviewed and are negative.    PAST MEDICAL/SURGICAL HISTORY:  Past Medical History:  Diagnosis Date  . Allergic rhinitis   . Anemia   . Arthritis    Lower back and hips  . Asthma   . Bipolar disorder ([place])   . 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[place])   . Constipation   . 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[place])   . Depression   . Essential hypertension, benign   . Fibromyalgia   . GERD (gastroesophageal reflux disease)   . H/O sleep apnea   . Hip pain, left   . History of palpitations    Negative Holter monitor  . History of pneumonia 1988  . Hyperlipidemia   . IBS (irritable bowel syndrome)   . Neck pain   . Poor short term memory   . PTSD (post-traumatic stress disorder)   . S/P Botox injection 11/2014    For migraine headaches  . Type 2 diabetes mellitus ([place])   . Urgency of urination    Past Surgical History:  Procedure Laterality Date  . CARPAL TUNNEL RELEASE Right 06/10/2013   Procedure: CARPAL  TUNNEL RELEASE;  Surgeon: Faythe Ghee, MD;  Location: [facility];  Service: Neurosurgery;  Laterality: Right;  Right Carpal Tunnel Release   . CHOLECYSTECTOMY    . DENTAL SURGERY  12/2014  . mole exc     x 3  . MUSCLE BIOPSY  2008   Right leg  . TUBAL LIGATION       SOCIAL HISTORY:  Social History   Socioeconomic History  . Marital status: Married    Spouse name: Not on file  . Number of children: 2  . Years of education: College  . Highest education level: Not on file  Occupational History  . 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 Financial resource strain: Not on file  . Food insecurity:    Worry: Not on file    Inability: Not on file  . Transportation needs:    Medical: Not on file    Non-medical: Not on file  Tobacco Use  . Smoking status: Former Smoker    Types: Cigarettes    Last attempt to quit: 11/04/2010    Years since quitting: 7.4  . Smokeless tobacco: Never Used  Substance and Sexual Activity  . Alcohol use: Yes    Alcohol/week: 0.0 oz    Comment: one mixed drink  per week  . Drug use: No  . Sexual activity: Yes    Birth control/protection: Surgical  Lifestyle  . Physical activity:    Days per week: Not on file    Minutes per session: Not on file  . Stress: Not on file  Relationships  . Social connections:    Talks on phone: Not on file    Gets together: Not on file    Attends religious service: Not on file    Active member of club or organization: Not on file    Attends meetings of clubs or organizations: Not on file    Relationship status: Not on file  . Intimate partner violence:    Fear of current or ex partner: Not on file    Emotionally abused: Not on file    Physically abused: Not on file    Forced sexual activity: Not on file  Other Topics Concern  . Not on file  Social History Narrative   Married   Lives with spouse and daughter   Right handed.    Caffeine use: 2 cups coffee in morning and 2 cups tea during the day     FAMILY HISTORY:  Family History  Problem Relation Age of Onset  . Fibromyalgia Mother   . Diabetes type II Mother   . Depression Mother   . Alzheimer's disease Mother   . GER disease Mother   . Heart disease Mother   . Paranoid behavior Mother   . Dementia Mother   . Heart attack Father 31       MI x5  . Stroke Father        CVA x7  . Asthma Father   . Brain cancer Father   . Seizures Father   . Anxiety disorder Sister   . OCD Sister   . Sexual abuse Sister   . Physical abuse Sister   . Anxiety disorder Sister   . ADD / ADHD Daughter   . ADD / ADHD Daughter   . Alcohol abuse Neg Hx   . Drug abuse Neg Hx   . Schizophrenia Neg Hx     CURRENT MEDICATIONS:  Outpatient Encounter Medications as of 04/12/2018  Medication Sig Note  . albuterol (PROVENTIL HFA;VENTOLIN HFA) 108 (90 BASE) MCG/ACT inhaler Inhale 2 puffs into the lungs every 6 (six) hours as needed for wheezing or shortness of breath.   . benztropine (COGENTIN) 0.5 MG tablet Take 1 tablet (0.5 mg total) by mouth at bedtime.   . clonazePAM (KLONOPIN) 0.5 MG tablet Take 1 tablet (0.5 mg total) by mouth 2 (two) times daily as needed for anxiety.   . FENOFIBRATE PO Take by mouth daily.   . fexofenadine (ALLEGRA) 30 MG tablet Take 30 mg by mouth daily.   Marland Kitchen FLUoxetine (PROZAC) 40 MG capsule Take 1 capsule (40 mg total) by mouth daily.   . fluticasone (FLONASE) 50 MCG/ACT nasal spray Place 2 sprays into both nostrils daily.   Marland Kitchen gabapentin (NEURONTIN) 300 MG capsule Take 300 mg by mouth 3 (three) times daily.   Marland Kitchen HYDROcodone-acetaminophen (NORCO) 7.5-325 MG tablet 1 tablet every 4 (four) hours as needed.  06/28/2016: Received from: External Pharmacy  . Lancets (ONETOUCH ULTRASOFT) lancets 1 each by Other route as needed.  04/25/2014: Received from: External Pharmacy  . Melatonin 5 MG TABS Take 5 mg by mouth at bedtime as needed.    . metFORMIN  (GLUCOPHAGE) 500 MG tablet Take 500 mg by mouth 2 (two) times daily  with a meal.  06/28/2016: Received from: External Pharmacy  . Multiple Vitamin (MULTIVITAMIN) tablet Take 1 tablet by mouth daily. Reported on 02/10/2016   . naproxen sodium (ALEVE) 220 MG tablet Take 220 mg by mouth 2 (two) times daily as needed.   Marland Kitchen omeprazole (PRILOSEC) 20 MG capsule Take 20 mg by mouth daily.    . ONE TOUCH ULTRA TEST test strip  04/25/2014: Received from: External Pharmacy  . OVER THE COUNTER MEDICATION Take 1 capsule by mouth daily. Reported on 02/10/2016   . polyethylene glycol powder (GLYCOLAX/MIRALAX) powder Use 1 capful or 1/2 capful daily (Patient taking differently: Use 1 capful PRN)   . pravastatin (PRAVACHOL) 40 MG tablet Take 40 mg by mouth daily.   . prazosin (MINIPRESS) 2 MG capsule Take 1 capsule (2 mg total) by mouth at bedtime.   . risperiDONE (RISPERDAL) 1 MG tablet Take 1 tablet (1 mg total) by mouth at bedtime.   Marland Kitchen tiZANidine (ZANAFLEX) 4 MG tablet Take 4 mg by mouth daily. 4mg  at bedtime and 2 mg during the day 03/12/2015: Received from: External Pharmacy Received Sig:   . [DISCONTINUED] EQ IBUPROFEN 200 MG CAPS Take 200 mg by mouth daily.  01/08/2014: Received from: External Pharmacy Received Sig:   . [DISCONTINUED] buPROPion (WELLBUTRIN SR) 150 MG 12 hr tablet Take 1 tablet (150 mg total) by mouth 2 (two) times daily.    No facility-administered encounter medications on file as of 04/12/2018.     ALLERGIES:  Allergies  Allergen Reactions  . Mold Extract  [Trichophyton] Shortness Of Breath    wheezing  . Other Anaphylaxis, Shortness Of Breath and Other (See Comments)     : Angioedema wheezing Wheezing wheezing Other reaction(s): Cough Wheezing Itching and rash Wheezing wheezing  . Sulfa Antibiotics Anaphylaxis     : Angioedema   . Sulfonamide Derivatives Anaphylaxis     : Angioedema  . Gluten Meal Itching  . Molds & Smuts   . Clonazepam Other (See Comments)    Confusion and memory  loss Caused nconfusion  . Dust Mite Extract   . Tegretol [Carbamazepine] Rash     PHYSICAL EXAM:  ECOG Performance status: 1  Vitals:   04/12/18 1111  BP: (!) 101/45  Pulse: 71  Resp: 18  Temp: 98.4 F (36.9 C)  SpO2: 98%   Filed Weights   04/12/18 1111  Weight: 230 lb (104.3 kg)    Physical Exam  Deferred LABORATORY DATA:  I have reviewed the labs as listed.  CBC    Component Value Date/Time   WBC 6.5 04/05/2018 1033   RBC 4.10 04/05/2018 1033   HGB 13.8 04/05/2018 1033   HCT 39.7 04/05/2018 1033   PLT 280 04/05/2018 1033   MCV 96.8 04/05/2018 1033   MCH 33.7 04/05/2018 1033   MCHC 34.8 04/05/2018 1033   RDW 12.3 04/05/2018 1033   LYMPHSABS 2.0 04/05/2018 1033   MONOABS 0.5 04/05/2018 1033   EOSABS 0.4 04/05/2018 1033   BASOSABS 0.1 04/05/2018 1033   CMP Latest Ref Rng & Units 04/05/2018 12/13/2017 05/22/2017  Glucose 65 - 99 mg/dL 121(H) 153(H) 296(H)  BUN 6 - 20 mg/dL 17 14 11   Creatinine 0.44 - 1.00 mg/dL 1.06(H) 0.95 1.02(H)  Sodium 135 - 145 mmol/L 138 140 134(L)  Potassium 3.5 - 5.1 mmol/L 4.4 4.8 4.2  Chloride 101 - 111 mmol/L 102 105 99(L)  CO2 22 - 32 mmol/L 26 27 26   Calcium 8.9 - 10.3 mg/dL 9.5 9.9 9.3  Total Protein 6.5 - 8.1 g/dL 6.9 6.9 6.4(L)  Total Bilirubin 0.3 - 1.2 mg/dL 1.0 0.4 0.7  Alkaline Phos 38 - 126 U/L 27(L) 39 34(L)  AST 15 - 41 U/L 45(H) 70(H) 33  ALT 14 - 54 U/L 48 62(H) 37        ASSESSMENT & PLAN:   Common variable immunodeficiency 1.  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  This is due to financial reasons based on record. - IgG levels are 556.  She does not report any sinopulmonary infections in the last 3 months.  She had UTI couple of times for which she was an antibiotic.  She denies any fevers, night sweats.  She has minor weight loss as she quit drinking sweet tea.  She does have occasional hot flashes.  We will continue to monitor her closely.  She will come back in 4  months with repeat IgG levels.      Orders placed this encounter:  Orders Placed This Encounter  Procedures  . IgG, IgA, IgM  . CMP ([facility] only)  . CBC with Differential ([place])      Derek Jack, MD [facility] [phone]

## 2018-04-18 DIAGNOSIS — R300 Dysuria: Secondary | ICD-10-CM | POA: Diagnosis not present

## 2018-04-18 DIAGNOSIS — D509 Iron deficiency anemia, unspecified: Secondary | ICD-10-CM | POA: Diagnosis not present

## 2018-04-18 DIAGNOSIS — G4733 Obstructive sleep apnea (adult) (pediatric): Secondary | ICD-10-CM | POA: Diagnosis not present

## 2018-04-18 DIAGNOSIS — R5382 Chronic fatigue, unspecified: Secondary | ICD-10-CM | POA: Diagnosis not present

## 2018-04-18 DIAGNOSIS — M4800 Spinal stenosis, site unspecified: Secondary | ICD-10-CM | POA: Diagnosis not present

## 2018-04-18 DIAGNOSIS — F339 Major depressive disorder, recurrent, unspecified: Secondary | ICD-10-CM | POA: Diagnosis not present

## 2018-04-18 DIAGNOSIS — E6609 Other obesity due to excess calories: Secondary | ICD-10-CM | POA: Diagnosis not present

## 2018-04-18 DIAGNOSIS — G589 Mononeuropathy, unspecified: Secondary | ICD-10-CM | POA: Diagnosis not present

## 2018-04-18 DIAGNOSIS — E119 Type 2 diabetes mellitus without complications: Secondary | ICD-10-CM | POA: Diagnosis not present

## 2018-04-18 DIAGNOSIS — G894 Chronic pain syndrome: Secondary | ICD-10-CM | POA: Diagnosis not present

## 2018-04-18 DIAGNOSIS — R945 Abnormal results of liver function studies: Secondary | ICD-10-CM | POA: Diagnosis not present

## 2018-04-18 DIAGNOSIS — J45909 Unspecified asthma, uncomplicated: Secondary | ICD-10-CM | POA: Diagnosis not present

## 2018-05-22 DIAGNOSIS — E782 Mixed hyperlipidemia: Secondary | ICD-10-CM | POA: Diagnosis not present

## 2018-05-22 DIAGNOSIS — E039 Hypothyroidism, unspecified: Secondary | ICD-10-CM | POA: Diagnosis not present

## 2018-05-22 DIAGNOSIS — E119 Type 2 diabetes mellitus without complications: Secondary | ICD-10-CM | POA: Diagnosis not present

## 2018-05-24 DIAGNOSIS — M545 Low back pain: Secondary | ICD-10-CM | POA: Diagnosis not present

## 2018-05-24 DIAGNOSIS — Z6833 Body mass index (BMI) 33.0-33.9, adult: Secondary | ICD-10-CM | POA: Diagnosis not present

## 2018-05-24 DIAGNOSIS — E782 Mixed hyperlipidemia: Secondary | ICD-10-CM | POA: Diagnosis not present

## 2018-05-24 DIAGNOSIS — E559 Vitamin D deficiency, unspecified: Secondary | ICD-10-CM | POA: Diagnosis not present

## 2018-05-24 DIAGNOSIS — J45909 Unspecified asthma, uncomplicated: Secondary | ICD-10-CM | POA: Diagnosis not present

## 2018-05-24 DIAGNOSIS — E1169 Type 2 diabetes mellitus with other specified complication: Secondary | ICD-10-CM | POA: Diagnosis not present

## 2018-05-24 DIAGNOSIS — M797 Fibromyalgia: Secondary | ICD-10-CM | POA: Diagnosis not present

## 2018-06-15 DIAGNOSIS — E875 Hyperkalemia: Secondary | ICD-10-CM | POA: Diagnosis not present

## 2018-06-15 DIAGNOSIS — E782 Mixed hyperlipidemia: Secondary | ICD-10-CM | POA: Diagnosis not present

## 2018-06-15 DIAGNOSIS — K219 Gastro-esophageal reflux disease without esophagitis: Secondary | ICD-10-CM | POA: Diagnosis not present

## 2018-06-15 DIAGNOSIS — E1169 Type 2 diabetes mellitus with other specified complication: Secondary | ICD-10-CM | POA: Diagnosis not present

## 2018-06-15 DIAGNOSIS — I10 Essential (primary) hypertension: Secondary | ICD-10-CM | POA: Diagnosis not present

## 2018-06-15 DIAGNOSIS — E039 Hypothyroidism, unspecified: Secondary | ICD-10-CM | POA: Diagnosis not present

## 2018-06-15 DIAGNOSIS — D839 Common variable immunodeficiency, unspecified: Secondary | ICD-10-CM | POA: Diagnosis not present

## 2018-06-15 DIAGNOSIS — F419 Anxiety disorder, unspecified: Secondary | ICD-10-CM | POA: Diagnosis not present

## 2018-06-15 DIAGNOSIS — E119 Type 2 diabetes mellitus without complications: Secondary | ICD-10-CM | POA: Diagnosis not present

## 2018-06-15 DIAGNOSIS — F339 Major depressive disorder, recurrent, unspecified: Secondary | ICD-10-CM | POA: Diagnosis not present

## 2018-06-25 DIAGNOSIS — M545 Low back pain: Secondary | ICD-10-CM | POA: Diagnosis not present

## 2018-06-25 DIAGNOSIS — N390 Urinary tract infection, site not specified: Secondary | ICD-10-CM | POA: Diagnosis not present

## 2018-06-25 DIAGNOSIS — R300 Dysuria: Secondary | ICD-10-CM | POA: Diagnosis not present

## 2018-08-17 ENCOUNTER — Inpatient Hospital Stay (HOSPITAL_COMMUNITY): Payer: PPO | Attending: Hematology

## 2018-08-17 DIAGNOSIS — D839 Common variable immunodeficiency, unspecified: Secondary | ICD-10-CM | POA: Insufficient documentation

## 2018-08-17 DIAGNOSIS — I10 Essential (primary) hypertension: Secondary | ICD-10-CM | POA: Insufficient documentation

## 2018-08-17 DIAGNOSIS — R7989 Other specified abnormal findings of blood chemistry: Secondary | ICD-10-CM | POA: Diagnosis not present

## 2018-08-17 DIAGNOSIS — Z87440 Personal history of urinary (tract) infections: Secondary | ICD-10-CM | POA: Diagnosis not present

## 2018-08-17 LAB — COMPREHENSIVE METABOLIC PANEL
ALT: 72 U/L — ABNORMAL HIGH (ref 0–44)
ANION GAP: 10 (ref 5–15)
AST: 78 U/L — ABNORMAL HIGH (ref 15–41)
Albumin: 4.7 g/dL (ref 3.5–5.0)
Alkaline Phosphatase: 29 U/L — ABNORMAL LOW (ref 38–126)
BILIRUBIN TOTAL: 0.8 mg/dL (ref 0.3–1.2)
BUN: 13 mg/dL (ref 6–20)
CHLORIDE: 103 mmol/L (ref 98–111)
CO2: 27 mmol/L (ref 22–32)
Calcium: 9.9 mg/dL (ref 8.9–10.3)
Creatinine, Ser: 0.93 mg/dL (ref 0.44–1.00)
GFR calc non Af Amer: >60 mL/min (ref >60)
Glucose, Bld: 142 mg/dL — ABNORMAL HIGH (ref 70–99)
POTASSIUM: 4.3 mmol/L (ref 3.5–5.1)
Sodium: 140 mmol/L (ref 135–145)
TOTAL PROTEIN: 7.2 g/dL (ref 6.5–8.1)

## 2018-08-17 LAB — CBC WITH DIFFERENTIAL/PLATELET
BASOS ABS: 0.1 10*3/uL (ref 0.0–0.1)
Basophils Relative: 1 %
EOS PCT: 4 %
Eosinophils Absolute: 0.3 10*3/uL (ref 0.0–0.7)
HEMATOCRIT: 39.2 % (ref 36.0–46.0)
Hemoglobin: 13.6 g/dL (ref 12.0–15.0)
LYMPHS PCT: 31 %
Lymphs Abs: 2.2 10*3/uL (ref 0.7–4.0)
MCH: 32.8 pg (ref 26.0–34.0)
MCHC: 34.7 g/dL (ref 30.0–36.0)
MCV: 94.5 fL (ref 78.0–100.0)
MONO ABS: 0.5 10*3/uL (ref 0.1–1.0)
MONOS PCT: 7 %
NEUTROS ABS: 4.1 10*3/uL (ref 1.7–7.7)
Neutrophils Relative %: 57 %
PLATELETS: 330 10*3/uL (ref 150–400)
RBC: 4.15 MIL/uL (ref 3.87–5.11)
RDW: 12.1 % (ref 11.5–15.5)
WBC: 7.2 10*3/uL (ref 4.0–10.5)

## 2018-08-18 LAB — IGG, IGA, IGM
IGM (IMMUNOGLOBULIN M), SRM: 16 mg/dL — AB (ref 26–217)
IgA: 39 mg/dL — ABNORMAL LOW (ref 87–352)
IgG (Immunoglobin G), Serum: 597 mg/dL — ABNORMAL LOW (ref 700–1600)

## 2018-08-23 DIAGNOSIS — Z6833 Body mass index (BMI) 33.0-33.9, adult: Secondary | ICD-10-CM | POA: Diagnosis not present

## 2018-08-23 DIAGNOSIS — K219 Gastro-esophageal reflux disease without esophagitis: Secondary | ICD-10-CM | POA: Diagnosis not present

## 2018-08-23 DIAGNOSIS — G4733 Obstructive sleep apnea (adult) (pediatric): Secondary | ICD-10-CM | POA: Diagnosis not present

## 2018-08-23 DIAGNOSIS — R945 Abnormal results of liver function studies: Secondary | ICD-10-CM | POA: Diagnosis not present

## 2018-08-23 DIAGNOSIS — E119 Type 2 diabetes mellitus without complications: Secondary | ICD-10-CM | POA: Diagnosis not present

## 2018-08-23 DIAGNOSIS — E1169 Type 2 diabetes mellitus with other specified complication: Secondary | ICD-10-CM | POA: Diagnosis not present

## 2018-08-23 DIAGNOSIS — M25552 Pain in left hip: Secondary | ICD-10-CM | POA: Diagnosis not present

## 2018-08-23 DIAGNOSIS — D509 Iron deficiency anemia, unspecified: Secondary | ICD-10-CM | POA: Diagnosis not present

## 2018-08-23 DIAGNOSIS — I10 Essential (primary) hypertension: Secondary | ICD-10-CM | POA: Diagnosis not present

## 2018-08-23 DIAGNOSIS — M4800 Spinal stenosis, site unspecified: Secondary | ICD-10-CM | POA: Diagnosis not present

## 2018-08-23 DIAGNOSIS — F339 Major depressive disorder, recurrent, unspecified: Secondary | ICD-10-CM | POA: Diagnosis not present

## 2018-08-23 DIAGNOSIS — E039 Hypothyroidism, unspecified: Secondary | ICD-10-CM | POA: Diagnosis not present

## 2018-08-24 ENCOUNTER — Ambulatory Visit (HOSPITAL_COMMUNITY): Payer: Self-pay | Admitting: Internal Medicine

## 2018-08-27 DIAGNOSIS — D509 Iron deficiency anemia, unspecified: Secondary | ICD-10-CM | POA: Diagnosis not present

## 2018-08-27 DIAGNOSIS — N390 Urinary tract infection, site not specified: Secondary | ICD-10-CM | POA: Diagnosis not present

## 2018-08-27 DIAGNOSIS — J45909 Unspecified asthma, uncomplicated: Secondary | ICD-10-CM | POA: Diagnosis not present

## 2018-08-27 DIAGNOSIS — E1165 Type 2 diabetes mellitus with hyperglycemia: Secondary | ICD-10-CM | POA: Diagnosis not present

## 2018-08-27 DIAGNOSIS — M545 Low back pain: Secondary | ICD-10-CM | POA: Diagnosis not present

## 2018-08-27 DIAGNOSIS — M4800 Spinal stenosis, site unspecified: Secondary | ICD-10-CM | POA: Diagnosis not present

## 2018-08-27 DIAGNOSIS — E559 Vitamin D deficiency, unspecified: Secondary | ICD-10-CM | POA: Diagnosis not present

## 2018-08-27 DIAGNOSIS — Z0001 Encounter for general adult medical examination with abnormal findings: Secondary | ICD-10-CM | POA: Diagnosis not present

## 2018-08-27 DIAGNOSIS — G894 Chronic pain syndrome: Secondary | ICD-10-CM | POA: Diagnosis not present

## 2018-08-27 DIAGNOSIS — R945 Abnormal results of liver function studies: Secondary | ICD-10-CM | POA: Diagnosis not present

## 2018-08-27 DIAGNOSIS — F339 Major depressive disorder, recurrent, unspecified: Secondary | ICD-10-CM | POA: Diagnosis not present

## 2018-08-27 DIAGNOSIS — R5382 Chronic fatigue, unspecified: Secondary | ICD-10-CM | POA: Diagnosis not present

## 2018-08-27 DIAGNOSIS — F319 Bipolar disorder, unspecified: Secondary | ICD-10-CM | POA: Diagnosis not present

## 2018-08-27 DIAGNOSIS — Z23 Encounter for immunization: Secondary | ICD-10-CM | POA: Diagnosis not present

## 2018-08-27 DIAGNOSIS — G589 Mononeuropathy, unspecified: Secondary | ICD-10-CM | POA: Diagnosis not present

## 2018-08-27 DIAGNOSIS — M797 Fibromyalgia: Secondary | ICD-10-CM | POA: Diagnosis not present

## 2018-08-27 DIAGNOSIS — E782 Mixed hyperlipidemia: Secondary | ICD-10-CM | POA: Diagnosis not present

## 2018-08-27 DIAGNOSIS — G4733 Obstructive sleep apnea (adult) (pediatric): Secondary | ICD-10-CM | POA: Diagnosis not present

## 2018-08-27 DIAGNOSIS — E119 Type 2 diabetes mellitus without complications: Secondary | ICD-10-CM | POA: Diagnosis not present

## 2018-08-27 DIAGNOSIS — D839 Common variable immunodeficiency, unspecified: Secondary | ICD-10-CM | POA: Diagnosis not present

## 2018-08-27 DIAGNOSIS — E6609 Other obesity due to excess calories: Secondary | ICD-10-CM | POA: Diagnosis not present

## 2018-08-28 ENCOUNTER — Other Ambulatory Visit: Payer: Self-pay

## 2018-08-28 ENCOUNTER — Other Ambulatory Visit (HOSPITAL_COMMUNITY): Payer: Self-pay | Admitting: Internal Medicine

## 2018-08-28 ENCOUNTER — Inpatient Hospital Stay (HOSPITAL_BASED_OUTPATIENT_CLINIC_OR_DEPARTMENT_OTHER): Payer: PPO | Admitting: Internal Medicine

## 2018-08-28 ENCOUNTER — Encounter (HOSPITAL_COMMUNITY): Payer: Self-pay | Admitting: Internal Medicine

## 2018-08-28 VITALS — BP 114/64 | HR 77 | Temp 97.7°F | Resp 18 | Wt 236.0 lb

## 2018-08-28 DIAGNOSIS — Z87440 Personal history of urinary (tract) infections: Secondary | ICD-10-CM | POA: Diagnosis not present

## 2018-08-28 DIAGNOSIS — D839 Common variable immunodeficiency, unspecified: Secondary | ICD-10-CM | POA: Diagnosis not present

## 2018-08-28 DIAGNOSIS — R7989 Other specified abnormal findings of blood chemistry: Secondary | ICD-10-CM | POA: Diagnosis not present

## 2018-08-28 DIAGNOSIS — I10 Essential (primary) hypertension: Secondary | ICD-10-CM

## 2018-08-28 DIAGNOSIS — R748 Abnormal levels of other serum enzymes: Secondary | ICD-10-CM

## 2018-08-28 NOTE — Progress Notes (Signed)
Diagnosis No diagnosis found.  Staging Cancer Staging No matching staging information was found for the patient.  Assessment and Plan:  Common variable immunodeficiency 1.  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  This is due to financial reasons based on record.  Labs done 08/17/2018 reviewed and showed WBC 7.2 HB 13.6 plts 330,000.  Chemistries WNL with K+ 4.3 Cr 0.93.  IgG is improved at 597 compared to 556 on labs done 04/2018.  Pt denies any URI symptoms.  She will RTC in 02/2019 for follow-up and repeat labs.    2.  Recurrent UTIs.  Pt is advised to discuss with PCP referral to Urology.    3.  Elevated LFTs.  AST 78, ALT 72.  Pt reports she is on cholesterol medications.  She reports she is set up for USN by PCP.  Will will follow-up results.    4.  HTN.  BP is 114/64.  Follow-up with PCP.    Current Status:  Pt is seen today for follow-up.  She reports recurrent UTIs.  She denies any URI symptoms.     Problem List Patient Active Problem List   Diagnosis Date Noted  . Migraine [G43.909] 10/28/2014  . Neck pain of over 3 months duration [M54.2, G89.29] 10/28/2014  . 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[place]) [D83.9] 07/29/2014  . Headache(784.0) [R51] 11/06/2013  . Cervicalgia [M54.2] 11/06/2013  . Acute confusional state [F05] 11/06/2013  . History of stroke in prior 3 months [Z86.73] 10/15/2013  . 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 Unspecified vitamin D deficiency [E55.9] 02/13/2009  . Morbid obesity ([place]) [E66.01] 02/13/2009  . BIPOLAR AFFECTIVE DISORDER [F31.9] 11/19/2007  . Essential hypertension, benign [I10] 11/16/2007  . Mixed hyperlipidemia [E78.2] 10/03/2007  . Hx of tobacco use, presenting hazards to health [Z87.891] 10/03/2007  . DEPRESSION [F32.9] 10/03/2007  . IBS [K58.9] 10/03/2007  . FIBROMYALGIA [IMO0001] 10/03/2007  . Palpitations [R00.2] 10/03/2007    Past Medical History Past Medical History:   Diagnosis Date  . Allergic rhinitis   . Anemia   . Arthritis    Lower back and hips  . Asthma   . Bipolar disorder ([place])   . 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[place])   . Constipation   . 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[place])   . Depression   . Essential hypertension, benign   . Fibromyalgia   . GERD (gastroesophageal reflux disease)   . H/O sleep apnea   . Hip pain, left   . History of palpitations    Negative Holter monitor  . History of pneumonia 1988  . Hyperlipidemia   . IBS (irritable bowel syndrome)   . Neck pain   . Poor short term memory   . PTSD (post-traumatic stress disorder)   . S/P Botox injection 11/2014    For migraine headaches  . Type 2 diabetes mellitus ([place])   . Urgency of urination     Past Surgical History Past Surgical History:  Procedure Laterality Date  . CARPAL TUNNEL RELEASE Right 06/10/2013   Procedure: CARPAL TUNNEL RELEASE;  Surgeon: Faythe Ghee, MD;  Location: [facility];  Service: Neurosurgery;  Laterality: Right;  Right Carpal Tunnel Release   . CHOLECYSTECTOMY    . DENTAL SURGERY  12/2014  . mole exc     x 3  . MUSCLE BIOPSY  2008   Right leg  . TUBAL LIGATION      Family History Family History  Problem Relation Age of  Onset  . Fibromyalgia Mother   . Diabetes type II Mother   . Depression Mother   . Alzheimer's disease Mother   . GER disease Mother   . Heart disease Mother   . Paranoid behavior Mother   . Dementia Mother   . Heart attack Father 31       MI x5  . Stroke Father        CVA x7  . Asthma Father   . Brain cancer Father   . Seizures Father   . Anxiety disorder Sister   . OCD Sister   . Sexual abuse Sister   . Physical abuse Sister   . Anxiety disorder Sister   . ADD / ADHD Daughter   . ADD / ADHD Daughter   . Alcohol abuse Neg Hx   . Drug abuse Neg Hx   . Schizophrenia Neg Hx      Social History  reports that she quit smoking about 7 years ago. Her smoking use included cigarettes. She has  never used smokeless tobacco. She reports that she drinks alcohol. She reports that she does not use drugs.  Medications  Current Outpatient Medications:  .  albuterol (PROVENTIL HFA;VENTOLIN HFA) 108 (90 BASE) MCG/ACT inhaler, Inhale 2 puffs into the lungs every 6 (six) hours as needed for wheezing or shortness of breath., Disp: , Rfl:  .  benztropine (COGENTIN) 0.5 MG tablet, Take 1 tablet (0.5 mg total) by mouth at bedtime., Disp: 90 tablet, Rfl: 2 .  clonazePAM (KLONOPIN) 0.5 MG tablet, Take 1 tablet (0.5 mg total) by mouth 2 (two) times daily as needed for anxiety., Disp: 180 tablet, Rfl: 1 .  fenofibrate 160 MG tablet, , Disp: , Rfl:  .  fexofenadine (ALLEGRA) 30 MG tablet, Take 30 mg by mouth daily., Disp: , Rfl:  .  FLUoxetine (PROZAC) 40 MG capsule, Take 1 capsule (40 mg total) by mouth daily., Disp: 90 capsule, Rfl: 2 .  fluticasone (FLONASE) 50 MCG/ACT nasal spray, Place 2 sprays into both nostrils daily., Disp: , Rfl:  .  gabapentin (NEURONTIN) 300 MG capsule, Take 300 mg by mouth 3 (three) times daily., Disp: , Rfl:  .  HYDROcodone-acetaminophen (NORCO) 7.5-325 MG tablet, 1 tablet every 4 (four) hours as needed. , Disp: , Rfl:  .  Lancets (ONETOUCH ULTRASOFT) lancets, 1 each by Other route as needed. , Disp: , Rfl:  .  Melatonin 5 MG TABS, Take 5 mg by mouth at bedtime as needed. , Disp: , Rfl:  .  metFORMIN (GLUCOPHAGE) 1000 MG tablet, , Disp: , Rfl:  .  Multiple Vitamin (MULTIVITAMIN) tablet, Take 1 tablet by mouth daily. Reported on 02/10/2016, Disp: , Rfl:  .  naproxen sodium (ALEVE) 220 MG tablet, Take 220 mg by mouth 2 (two) times daily as needed., Disp: , Rfl:  .  omeprazole (PRILOSEC) 20 MG capsule, Take 20 mg by mouth daily. , Disp: , Rfl:  .  ONE TOUCH ULTRA TEST test strip, , Disp: , Rfl:  .  OVER THE COUNTER MEDICATION, Take 1 capsule by mouth daily. Reported on 02/10/2016, Disp: , Rfl:  .  polyethylene glycol powder (GLYCOLAX/MIRALAX) powder, Use 1 capful or 1/2 capful  daily (Patient taking differently: Use 1 capful PRN), Disp: 500 g, Rfl: 2 .  pravastatin (PRAVACHOL) 40 MG tablet, Take 40 mg by mouth daily., Disp: , Rfl:  .  prazosin (MINIPRESS) 2 MG capsule, Take 1 capsule (2 mg total) by mouth at bedtime., Disp: 90 capsule, Rfl:  2 .  risperiDONE (RISPERDAL) 1 MG tablet, Take 1 tablet (1 mg total) by mouth at bedtime., Disp: 90 tablet, Rfl: 2 .  tiZANidine (ZANAFLEX) 4 MG tablet, Take 4 mg by mouth daily. 42m at bedtime and 2 mg during the day, Disp: , Rfl:   Allergies Mold extract  [trichophyton]; Other; Sulfa antibiotics; Sulfonamide derivatives; Gluten meal; Molds & smuts; Clonazepam; Dust mite extract; and Tegretol [carbamazepine]  Review of Systems Review of Systems - Oncology ROS negative other than recurrent UTIs.     Physical Exam  Vitals Wt Readings from Last 3 Encounters:  08/28/18 236 lb (107 kg)  04/12/18 230 lb (104.3 kg)  12/13/17 234 lb (106.1 kg)   Temp Readings from Last 3 Encounters:  08/28/18 97.7 F (36.5 C) (Oral)  04/12/18 98.4 F (36.9 C) (Oral)  12/13/17 98.3 F (36.8 C) (Oral)   BP Readings from Last 3 Encounters:  08/28/18 114/64  04/12/18 (!) 101/45  12/13/17 (!) 98/59   Pulse Readings from Last 3 Encounters:  08/28/18 77  04/12/18 71  12/13/17 60   Constitutional: Well-developed, well-nourished, and in no distress.   HENT: Head: Normocephalic and atraumatic.  Mouth/Throat: No oropharyngeal exudate. Mucosa moist. Eyes: Pupils are equal, round, and reactive to light. Conjunctivae are normal. No scleral icterus.  Neck: Normal range of motion. Neck supple. No JVD present.  Cardiovascular: Normal rate, regular rhythm and normal heart sounds.  Exam reveals no gallop and no friction rub.   No murmur heard. Pulmonary/Chest: Effort normal and breath sounds normal. No respiratory distress. No wheezes.No rales.  Abdominal: Soft. Bowel sounds are normal. No distension. There is no tenderness. There is no guarding.   Musculoskeletal: No edema or tenderness.  Lymphadenopathy: No cervical, axillary or supraclavicular adenopathy.  Neurological: Alert and oriented to person, place, and time. No cranial nerve deficit.  Skin: Skin is warm and dry. No rash noted. No erythema. No pallor.  Psychiatric: Affect and judgment normal.   Labs No visits with results within 3 Day(s) from this visit.  Latest known visit with results is:  Appointment on 08/17/2018  Component Date Value Ref Range Status  . IgG (Immunoglobin G), Serum 08/17/2018 597* 700 - 1,600 mg/dL Final  . IgA 08/17/2018 39* 87 - 352 mg/dL Final   Result confirmed on concentration.  . IgM (Immunoglobulin M), Srm 08/17/2018 16* 26 - 217 mg/dL Final   Comment: (NOTE) Result confirmed on concentration. [id number] FarmerMD POZ:[id number]  . WBC 08/17/2018 7.2  4.0 - 10.5 K/uL Final  . RBC 08/17/2018 4.15  3.87 - 5.11 MIL/uL Final  . Hemoglobin 08/17/2018 13.6  12.0 - 15.0 g/dL Final  . HCT 08/17/2018 39.2  36.0 - 46.0 % Final  . MCV 08/17/2018 94.5  78.0 - 100.0 fL Final  . MCH 08/17/2018 32.8  26.0 - 34.0 pg Final  . MCHC 08/17/2018 34.7  30.0 - 36.0 g/dL Final  . RDW 08/17/2018 12.1  11.5 - 15.5 % Final  . Platelets 08/17/2018 330  150 - 400 K/uL Final  . Neutrophils Relative % 08/17/2018 57  % Final  . Neutro Abs 08/17/2018 4.1  1.7 - 7.7 K/uL Final  . Lymphocytes Relative 08/17/2018 31  % Final  . Lymphs Abs 08/17/2018 2.2  0.7 - 4.0 K/uL Final  . Monocytes Relative 08/17/2018 7  % Final  . Monocytes Absolute 08/17/2018 0.5  0.1 - 1.0 K/uL Final  . Eosinophils Relative  08/17/2018 4  % Final  . Eosinophils Absolute 08/17/2018 0.3  0.0 - 0.7 K/uL Final  . Basophils Relative 08/17/2018 1  % Final  . Basophils Absolute 08/17/2018 0.1  0.0 - 0.1 K/uL Final   Performed at [facility], [address] [place], [place] [address]  . Sodium 08/17/2018 140  135 - 145  mmol/L Final  . Potassium 08/17/2018 4.3  3.5 - 5.1 mmol/L Final  . Chloride 08/17/2018 103  98 - 111 mmol/L Final  . CO2 08/17/2018 27  22 - 32 mmol/L Final  . Glucose, Bld 08/17/2018 142* 70 - 99 mg/dL Final  . BUN 08/17/2018 13  6 - 20 mg/dL Final  . Creatinine, Ser 08/17/2018 0.93  0.44 - 1.00 mg/dL Final  . Calcium 08/17/2018 9.9  8.9 - 10.3 mg/dL Final  . Total Protein 08/17/2018 7.2  6.5 - 8.1 g/dL Final  . Albumin 08/17/2018 4.7  3.5 - 5.0 g/dL Final  . AST 08/17/2018 78* 15 - 41 U/L Final  . ALT 08/17/2018 72* 0 - 44 U/L Final  . Alkaline Phosphatase 08/17/2018 29* 38 - 126 U/L Final  . Total Bilirubin 08/17/2018 0.8  0.3 - 1.2 mg/dL Final  . GFR calc non Af Amer 08/17/2018 >60  >60 mL/min Final  . GFR calc Af Amer 08/17/2018 >60  >60 mL/min Final   Comment: (NOTE) The eGFR has been calculated using the CKD EPI equation. This calculation has not been validated in all clinical situations. eGFR's persistently <60 mL/min signify possible Chronic Kidney Disease.   Georgiann Hahn gap 08/17/2018 10  5 - 15 Final   Performed at [facility], [address], [place], [place] [address]     Pathology No orders of the defined types were placed in this encounter.      Zoila Shutter MD

## 2018-08-28 NOTE — Patient Instructions (Signed)
[place] at [facility] Discharge Instructions  You saw Dr. Walden Field today.   Thank you for choosing [facility] at [facility] to provide your oncology and hematology care.  To afford each patient quality time with our provider, please arrive at least 15 minutes before your scheduled appointment time.   If you have a lab appointment with the [facility] please come in thru the  Main Entrance and check in at the main information desk  You need to re-schedule your appointment should you arrive 10 or more minutes late.  We strive to give you quality time with our providers, and arriving late affects you and other patients whose appointments are after yours.  Also, if you no show three or more times for appointments you may be dismissed from the clinic at the providers discretion.     Again, thank you for choosing [facility].  Our hope is that these requests will decrease the amount of time that you wait before being seen by our physicians.       _____________________________________________________________  Should you have questions after your visit to [facility], please contact our office at [phone] between the hours of 8:00 a.m. and 4:30 p.m.  Voicemails left after 4:00 p.m. will not be returned until the following business day.  For prescription refill requests, have your pharmacy contact our office and allow 72 hours.    Cancer Center Support Programs:   > Cancer Support Group  2nd Tuesday of the month 1pm-2pm, Journey Room

## 2018-09-04 ENCOUNTER — Ambulatory Visit (HOSPITAL_COMMUNITY)
Admission: RE | Admit: 2018-09-04 | Discharge: 2018-09-04 | Disposition: A | Discharge status: Home / Self Care | Payer: PPO | Source: Ambulatory Visit | Attending: Internal Medicine | Admitting: Internal Medicine

## 2018-09-04 DIAGNOSIS — Z9049 Acquired absence of other specified parts of digestive tract: Secondary | ICD-10-CM | POA: Diagnosis not present

## 2018-09-04 DIAGNOSIS — R7989 Other specified abnormal findings of blood chemistry: Secondary | ICD-10-CM | POA: Diagnosis not present

## 2018-09-04 DIAGNOSIS — K760 Fatty (change of) liver, not elsewhere classified: Secondary | ICD-10-CM | POA: Diagnosis not present

## 2018-09-04 DIAGNOSIS — R748 Abnormal levels of other serum enzymes: Secondary | ICD-10-CM | POA: Diagnosis not present

## 2018-09-11 DIAGNOSIS — M5136 Other intervertebral disc degeneration, lumbar region: Secondary | ICD-10-CM | POA: Diagnosis not present

## 2018-09-11 DIAGNOSIS — Z79899 Other long term (current) drug therapy: Secondary | ICD-10-CM | POA: Diagnosis not present

## 2018-09-11 DIAGNOSIS — M1612 Unilateral primary osteoarthritis, left hip: Secondary | ICD-10-CM | POA: Diagnosis not present

## 2018-09-11 DIAGNOSIS — Z0389 Encounter for observation for other suspected diseases and conditions ruled out: Secondary | ICD-10-CM | POA: Diagnosis not present

## 2018-09-11 DIAGNOSIS — M545 Low back pain: Secondary | ICD-10-CM | POA: Diagnosis not present

## 2018-09-11 DIAGNOSIS — E119 Type 2 diabetes mellitus without complications: Secondary | ICD-10-CM | POA: Diagnosis not present

## 2018-09-11 DIAGNOSIS — J45909 Unspecified asthma, uncomplicated: Secondary | ICD-10-CM | POA: Diagnosis not present

## 2018-09-11 DIAGNOSIS — E039 Hypothyroidism, unspecified: Secondary | ICD-10-CM | POA: Diagnosis not present

## 2018-09-17 DIAGNOSIS — M25552 Pain in left hip: Secondary | ICD-10-CM | POA: Diagnosis not present

## 2018-09-17 DIAGNOSIS — Z6833 Body mass index (BMI) 33.0-33.9, adult: Secondary | ICD-10-CM | POA: Diagnosis not present

## 2018-09-20 ENCOUNTER — Ambulatory Visit (HOSPITAL_COMMUNITY): Payer: PPO | Admitting: Psychiatry

## 2018-09-20 ENCOUNTER — Encounter (HOSPITAL_COMMUNITY): Payer: Self-pay | Admitting: Psychiatry

## 2018-09-20 VITALS — BP 112/75 | HR 67 | Ht 71.0 in | Wt 227.0 lb

## 2018-09-20 DIAGNOSIS — F332 Major depressive disorder, recurrent severe without psychotic features: Secondary | ICD-10-CM | POA: Diagnosis not present

## 2018-09-20 DIAGNOSIS — F419 Anxiety disorder, unspecified: Secondary | ICD-10-CM

## 2018-09-20 MED ORDER — CLONAZEPAM 0.5 MG PO TABS: 0.5000 mg | ORAL_TABLET | Freq: Two times a day (BID) | ORAL | 1 refills | Status: DC | PRN

## 2018-09-20 MED ORDER — FLUOXETINE HCL 40 MG PO CAPS: 40.0000 mg | ORAL_CAPSULE | Freq: Every day | ORAL | 2 refills | Status: DC

## 2018-09-20 MED ORDER — PRAZOSIN HCL 2 MG PO CAPS: 2.0000 mg | ORAL_CAPSULE | Freq: Every day | ORAL | 2 refills | Status: DC

## 2018-09-20 MED ORDER — BENZTROPINE MESYLATE 0.5 MG PO TABS: 0.5000 mg | ORAL_TABLET | Freq: Every day | ORAL | 2 refills | Status: DC

## 2018-09-20 MED ORDER — RISPERIDONE 1 MG PO TABS: 1.0000 mg | ORAL_TABLET | Freq: Every day | ORAL | 2 refills | Status: DC
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
## 2018-09-24 DIAGNOSIS — F319 Bipolar disorder, unspecified: Secondary | ICD-10-CM | POA: Diagnosis not present

## 2018-09-24 DIAGNOSIS — M797 Fibromyalgia: Secondary | ICD-10-CM | POA: Diagnosis not present

## 2018-09-24 DIAGNOSIS — E782 Mixed hyperlipidemia: Secondary | ICD-10-CM | POA: Diagnosis not present

## 2018-09-24 DIAGNOSIS — I10 Essential (primary) hypertension: Secondary | ICD-10-CM | POA: Diagnosis not present

## 2018-09-24 DIAGNOSIS — G894 Chronic pain syndrome: Secondary | ICD-10-CM | POA: Diagnosis not present

## 2018-09-24 DIAGNOSIS — E039 Hypothyroidism, unspecified: Secondary | ICD-10-CM | POA: Diagnosis not present

## 2018-09-24 DIAGNOSIS — E1165 Type 2 diabetes mellitus with hyperglycemia: Secondary | ICD-10-CM | POA: Diagnosis not present

## 2018-09-24 DIAGNOSIS — F339 Major depressive disorder, recurrent, unspecified: Secondary | ICD-10-CM | POA: Diagnosis not present

## 2018-09-24 DIAGNOSIS — D509 Iron deficiency anemia, unspecified: Secondary | ICD-10-CM | POA: Diagnosis not present

## 2018-09-24 DIAGNOSIS — E559 Vitamin D deficiency, unspecified: Secondary | ICD-10-CM | POA: Diagnosis not present

## 2018-09-24 DIAGNOSIS — F419 Anxiety disorder, unspecified: Secondary | ICD-10-CM | POA: Diagnosis not present

## 2018-09-25 DIAGNOSIS — M7062 Trochanteric bursitis, left hip: Secondary | ICD-10-CM | POA: Insufficient documentation

## 2018-09-25 DIAGNOSIS — M1612 Unilateral primary osteoarthritis, left hip: Secondary | ICD-10-CM | POA: Insufficient documentation

## 2018-10-02 ENCOUNTER — Telehealth (HOSPITAL_COMMUNITY): Payer: Self-pay | Admitting: *Deleted

## 2018-10-02 ENCOUNTER — Other Ambulatory Visit (HOSPITAL_COMMUNITY): Payer: Self-pay | Admitting: Psychiatry

## 2018-10-02 MED ORDER — FLUOXETINE HCL 40 MG PO CAPS: 40.0000 mg | ORAL_CAPSULE | Freq: Every day | ORAL | 2 refills | Status: DC

## 2018-10-02 MED ORDER — BENZTROPINE MESYLATE 0.5 MG PO TABS: 0.5000 mg | ORAL_TABLET | Freq: Every day | ORAL | 2 refills | Status: DC

## 2018-10-02 MED ORDER — PRAZOSIN HCL 2 MG PO CAPS: 2.0000 mg | ORAL_CAPSULE | Freq: Every day | ORAL | 2 refills | Status: DC

## 2018-10-02 MED ORDER — CLONAZEPAM 0.5 MG PO TABS: 0.5000 mg | ORAL_TABLET | Freq: Two times a day (BID) | ORAL | 1 refills | Status: DC | PRN

## 2018-10-02 MED ORDER — RISPERIDONE 1 MG PO TABS: 1.0000 mg | ORAL_TABLET | Freq: Every day | ORAL | 2 refills | Status: DC

## 2018-10-02 NOTE — Telephone Encounter (Signed)
Dr Harrington Challenger Patient's medication will now be managed by [facility] & all new scripts are quested to be sent  For patient's medication. Rx updated in system

## 2018-10-02 NOTE — Telephone Encounter (Signed)
resent

## 2018-10-19 DIAGNOSIS — M1612 Unilateral primary osteoarthritis, left hip: Secondary | ICD-10-CM | POA: Diagnosis not present

## 2018-10-19 DIAGNOSIS — M7062 Trochanteric bursitis, left hip: Secondary | ICD-10-CM | POA: Diagnosis not present

## 2018-10-23 DIAGNOSIS — M7062 Trochanteric bursitis, left hip: Secondary | ICD-10-CM | POA: Diagnosis not present

## 2018-10-23 DIAGNOSIS — M1612 Unilateral primary osteoarthritis, left hip: Secondary | ICD-10-CM | POA: Diagnosis not present

## 2018-10-26 DIAGNOSIS — M7062 Trochanteric bursitis, left hip: Secondary | ICD-10-CM | POA: Diagnosis not present

## 2018-10-26 DIAGNOSIS — M1612 Unilateral primary osteoarthritis, left hip: Secondary | ICD-10-CM | POA: Diagnosis not present

## 2018-10-29 DIAGNOSIS — M7062 Trochanteric bursitis, left hip: Secondary | ICD-10-CM | POA: Diagnosis not present

## 2018-10-29 DIAGNOSIS — M1612 Unilateral primary osteoarthritis, left hip: Secondary | ICD-10-CM | POA: Diagnosis not present

## 2018-11-05 DIAGNOSIS — M1612 Unilateral primary osteoarthritis, left hip: Secondary | ICD-10-CM | POA: Diagnosis not present

## 2018-11-05 DIAGNOSIS — M7062 Trochanteric bursitis, left hip: Secondary | ICD-10-CM | POA: Diagnosis not present

## 2018-11-14 DIAGNOSIS — M533 Sacrococcygeal disorders, not elsewhere classified: Secondary | ICD-10-CM | POA: Diagnosis not present

## 2018-11-22 DIAGNOSIS — E559 Vitamin D deficiency, unspecified: Secondary | ICD-10-CM | POA: Diagnosis not present

## 2018-11-22 DIAGNOSIS — E1165 Type 2 diabetes mellitus with hyperglycemia: Secondary | ICD-10-CM | POA: Diagnosis not present

## 2018-11-22 DIAGNOSIS — E782 Mixed hyperlipidemia: Secondary | ICD-10-CM | POA: Diagnosis not present

## 2018-11-22 DIAGNOSIS — E039 Hypothyroidism, unspecified: Secondary | ICD-10-CM | POA: Diagnosis not present

## 2018-11-22 DIAGNOSIS — I10 Essential (primary) hypertension: Secondary | ICD-10-CM | POA: Diagnosis not present

## 2018-12-06 DIAGNOSIS — E039 Hypothyroidism, unspecified: Secondary | ICD-10-CM | POA: Diagnosis not present

## 2018-12-06 DIAGNOSIS — F339 Major depressive disorder, recurrent, unspecified: Secondary | ICD-10-CM | POA: Diagnosis not present

## 2018-12-06 DIAGNOSIS — E782 Mixed hyperlipidemia: Secondary | ICD-10-CM | POA: Diagnosis not present

## 2018-12-06 DIAGNOSIS — I10 Essential (primary) hypertension: Secondary | ICD-10-CM | POA: Diagnosis not present

## 2018-12-06 DIAGNOSIS — K219 Gastro-esophageal reflux disease without esophagitis: Secondary | ICD-10-CM | POA: Diagnosis not present

## 2018-12-06 DIAGNOSIS — J45909 Unspecified asthma, uncomplicated: Secondary | ICD-10-CM | POA: Diagnosis not present

## 2018-12-06 DIAGNOSIS — F319 Bipolar disorder, unspecified: Secondary | ICD-10-CM | POA: Diagnosis not present

## 2018-12-06 DIAGNOSIS — E1169 Type 2 diabetes mellitus with other specified complication: Secondary | ICD-10-CM | POA: Diagnosis not present

## 2018-12-06 DIAGNOSIS — E119 Type 2 diabetes mellitus without complications: Secondary | ICD-10-CM | POA: Diagnosis not present

## 2018-12-07 DIAGNOSIS — M533 Sacrococcygeal disorders, not elsewhere classified: Secondary | ICD-10-CM | POA: Diagnosis not present

## 2018-12-07 DIAGNOSIS — G894 Chronic pain syndrome: Secondary | ICD-10-CM | POA: Diagnosis not present

## 2018-12-10 ENCOUNTER — Telehealth (HOSPITAL_COMMUNITY): Payer: Self-pay

## 2018-12-10 ENCOUNTER — Other Ambulatory Visit (HOSPITAL_COMMUNITY): Payer: Self-pay | Admitting: Internal Medicine

## 2018-12-10 DIAGNOSIS — Z1231 Encounter for screening mammogram for malignant neoplasm of breast: Secondary | ICD-10-CM

## 2018-12-10 NOTE — Telephone Encounter (Signed)
I called the patients PCP and informed them of the message, should their office do the referral or did you want [place] here to refer the patient?

## 2018-12-10 NOTE — Telephone Encounter (Signed)
Michelle Clements from Dr. Juel Burrow office called saying that a this patient had missed an appointment with one of the providers, so the provider reached out to her. The patient is currently experiencing memory loss, it become frequent where she is forgetting things a lot. Dr. Juel Burrow office said the patient should come see [place] for an evaluation.. Please advise

## 2018-12-10 NOTE — Telephone Encounter (Signed)
They need to do it

## 2018-12-10 NOTE — Telephone Encounter (Signed)
The patient is supposed to see me on 1/17. We do not have a psychologist here who can do testing for memory loss. Suggest neurology referral

## 2018-12-11 DIAGNOSIS — E1169 Type 2 diabetes mellitus with other specified complication: Secondary | ICD-10-CM | POA: Diagnosis not present

## 2018-12-11 DIAGNOSIS — E782 Mixed hyperlipidemia: Secondary | ICD-10-CM | POA: Diagnosis not present

## 2018-12-11 DIAGNOSIS — E039 Hypothyroidism, unspecified: Secondary | ICD-10-CM | POA: Diagnosis not present

## 2018-12-11 DIAGNOSIS — E1165 Type 2 diabetes mellitus with hyperglycemia: Secondary | ICD-10-CM | POA: Diagnosis not present

## 2018-12-11 DIAGNOSIS — D509 Iron deficiency anemia, unspecified: Secondary | ICD-10-CM | POA: Diagnosis not present

## 2018-12-11 DIAGNOSIS — I10 Essential (primary) hypertension: Secondary | ICD-10-CM | POA: Diagnosis not present

## 2018-12-11 DIAGNOSIS — E559 Vitamin D deficiency, unspecified: Secondary | ICD-10-CM | POA: Diagnosis not present

## 2018-12-11 NOTE — Telephone Encounter (Signed)
Called Dr. Juel Burrow office spoke with Cordelia Pen about the referral to neurology and she said they will take care of it

## 2018-12-13 DIAGNOSIS — Z1283 Encounter for screening for malignant neoplasm of skin: Secondary | ICD-10-CM | POA: Diagnosis not present

## 2018-12-13 DIAGNOSIS — D485 Neoplasm of uncertain behavior of skin: Secondary | ICD-10-CM | POA: Diagnosis not present

## 2018-12-13 DIAGNOSIS — L308 Other specified dermatitis: Secondary | ICD-10-CM | POA: Diagnosis not present

## 2018-12-13 DIAGNOSIS — L821 Other seborrheic keratosis: Secondary | ICD-10-CM | POA: Diagnosis not present

## 2018-12-13 DIAGNOSIS — D225 Melanocytic nevi of trunk: Secondary | ICD-10-CM | POA: Diagnosis not present

## 2018-12-17 DIAGNOSIS — E039 Hypothyroidism, unspecified: Secondary | ICD-10-CM | POA: Diagnosis not present

## 2018-12-17 DIAGNOSIS — R945 Abnormal results of liver function studies: Secondary | ICD-10-CM | POA: Diagnosis not present

## 2018-12-17 DIAGNOSIS — E559 Vitamin D deficiency, unspecified: Secondary | ICD-10-CM | POA: Diagnosis not present

## 2018-12-17 DIAGNOSIS — M4800 Spinal stenosis, site unspecified: Secondary | ICD-10-CM | POA: Diagnosis not present

## 2018-12-17 DIAGNOSIS — F319 Bipolar disorder, unspecified: Secondary | ICD-10-CM | POA: Diagnosis not present

## 2018-12-17 DIAGNOSIS — E1169 Type 2 diabetes mellitus with other specified complication: Secondary | ICD-10-CM | POA: Diagnosis not present

## 2018-12-17 DIAGNOSIS — G4733 Obstructive sleep apnea (adult) (pediatric): Secondary | ICD-10-CM | POA: Diagnosis not present

## 2018-12-17 DIAGNOSIS — B379 Candidiasis, unspecified: Secondary | ICD-10-CM | POA: Diagnosis not present

## 2018-12-17 DIAGNOSIS — D839 Common variable immunodeficiency, unspecified: Secondary | ICD-10-CM | POA: Diagnosis not present

## 2018-12-17 DIAGNOSIS — F339 Major depressive disorder, recurrent, unspecified: Secondary | ICD-10-CM | POA: Diagnosis not present

## 2018-12-17 DIAGNOSIS — M797 Fibromyalgia: Secondary | ICD-10-CM | POA: Diagnosis not present

## 2018-12-17 DIAGNOSIS — D509 Iron deficiency anemia, unspecified: Secondary | ICD-10-CM | POA: Diagnosis not present

## 2018-12-17 DIAGNOSIS — E119 Type 2 diabetes mellitus without complications: Secondary | ICD-10-CM | POA: Diagnosis not present

## 2018-12-17 DIAGNOSIS — E6609 Other obesity due to excess calories: Secondary | ICD-10-CM | POA: Diagnosis not present

## 2018-12-17 DIAGNOSIS — E1165 Type 2 diabetes mellitus with hyperglycemia: Secondary | ICD-10-CM | POA: Diagnosis not present

## 2018-12-17 DIAGNOSIS — N390 Urinary tract infection, site not specified: Secondary | ICD-10-CM | POA: Diagnosis not present

## 2018-12-17 DIAGNOSIS — J45909 Unspecified asthma, uncomplicated: Secondary | ICD-10-CM | POA: Diagnosis not present

## 2018-12-17 DIAGNOSIS — E782 Mixed hyperlipidemia: Secondary | ICD-10-CM | POA: Diagnosis not present

## 2018-12-17 DIAGNOSIS — E875 Hyperkalemia: Secondary | ICD-10-CM | POA: Diagnosis not present

## 2018-12-20 ENCOUNTER — Ambulatory Visit: Payer: PPO | Admitting: Neurology

## 2018-12-21 ENCOUNTER — Encounter (HOSPITAL_COMMUNITY): Payer: Self-pay | Admitting: Psychiatry

## 2018-12-21 ENCOUNTER — Ambulatory Visit (INDEPENDENT_AMBULATORY_CARE_PROVIDER_SITE_OTHER): Payer: PPO | Admitting: Psychiatry

## 2018-12-21 VITALS — BP 104/71 | HR 78 | Ht 71.0 in | Wt 229.0 lb

## 2018-12-21 DIAGNOSIS — F332 Major depressive disorder, recurrent severe without psychotic features: Secondary | ICD-10-CM | POA: Diagnosis not present

## 2018-12-21 MED ORDER — PRAZOSIN HCL 2 MG PO CAPS: 2.0000 mg | ORAL_CAPSULE | Freq: Every day | ORAL | 2 refills | Status: DC

## 2018-12-21 MED ORDER — CLONAZEPAM 0.5 MG PO TABS: 0.5000 mg | ORAL_TABLET | Freq: Two times a day (BID) | ORAL | 1 refills | Status: DC | PRN

## 2018-12-21 MED ORDER — BENZTROPINE MESYLATE 0.5 MG PO TABS: 0.5000 mg | ORAL_TABLET | Freq: Every day | ORAL | 2 refills | Status: DC

## 2018-12-21 MED ORDER — RISPERIDONE 1 MG PO TABS: 1.0000 mg | ORAL_TABLET | Freq: Every day | ORAL | 2 refills | Status: DC

## 2018-12-21 MED ORDER — FLUOXETINE HCL 40 MG PO CAPS: 40.0000 mg | ORAL_CAPSULE | Freq: Every day | ORAL | 2 refills | Status: DC
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
## 2018-12-26 ENCOUNTER — Inpatient Hospital Stay (HOSPITAL_COMMUNITY): Payer: PPO | Attending: Internal Medicine

## 2018-12-26 DIAGNOSIS — Z87891 Personal history of nicotine dependence: Secondary | ICD-10-CM | POA: Diagnosis not present

## 2018-12-26 DIAGNOSIS — D839 Common variable immunodeficiency, unspecified: Secondary | ICD-10-CM | POA: Diagnosis not present

## 2018-12-26 DIAGNOSIS — E119 Type 2 diabetes mellitus without complications: Secondary | ICD-10-CM | POA: Insufficient documentation

## 2018-12-26 DIAGNOSIS — Z7984 Long term (current) use of oral hypoglycemic drugs: Secondary | ICD-10-CM | POA: Insufficient documentation

## 2018-12-26 DIAGNOSIS — R7989 Other specified abnormal findings of blood chemistry: Secondary | ICD-10-CM | POA: Insufficient documentation

## 2018-12-26 DIAGNOSIS — K760 Fatty (change of) liver, not elsewhere classified: Secondary | ICD-10-CM | POA: Diagnosis not present

## 2018-12-26 DIAGNOSIS — Z9049 Acquired absence of other specified parts of digestive tract: Secondary | ICD-10-CM | POA: Insufficient documentation

## 2018-12-26 DIAGNOSIS — I10 Essential (primary) hypertension: Secondary | ICD-10-CM | POA: Diagnosis not present

## 2018-12-26 DIAGNOSIS — Z79899 Other long term (current) drug therapy: Secondary | ICD-10-CM | POA: Diagnosis not present

## 2018-12-26 LAB — COMPREHENSIVE METABOLIC PANEL
ALT: 62 U/L — ABNORMAL HIGH (ref 0–44)
AST: 59 U/L — ABNORMAL HIGH (ref 15–41)
Albumin: 4.4 g/dL (ref 3.5–5.0)
Alkaline Phosphatase: 30 U/L — ABNORMAL LOW (ref 38–126)
Anion gap: 8 (ref 5–15)
BUN: 9 mg/dL (ref 6–20)
CALCIUM: 9.8 mg/dL (ref 8.9–10.3)
CO2: 26 mmol/L (ref 22–32)
Chloride: 101 mmol/L (ref 98–111)
Creatinine, Ser: 0.89 mg/dL (ref 0.44–1.00)
GFR calc Af Amer: >60 mL/min (ref >60)
GFR calc non Af Amer: >60 mL/min (ref >60)
Glucose, Bld: 198 mg/dL — ABNORMAL HIGH (ref 70–99)
Potassium: 4.7 mmol/L (ref 3.5–5.1)
Sodium: 135 mmol/L (ref 135–145)
Total Bilirubin: 0.6 mg/dL (ref 0.3–1.2)
Total Protein: 6.8 g/dL (ref 6.5–8.1)

## 2018-12-26 LAB — CBC WITH DIFFERENTIAL/PLATELET
Abs Immature Granulocytes: 0.03 10*3/uL (ref 0.00–0.07)
Basophils Absolute: 0.1 10*3/uL (ref 0.0–0.1)
Basophils Relative: 1 %
Eosinophils Absolute: 0.2 10*3/uL (ref 0.0–0.5)
Eosinophils Relative: 3 %
HCT: 38.5 % (ref 36.0–46.0)
Hemoglobin: 13.2 g/dL (ref 12.0–15.0)
Immature Granulocytes: 0 %
Lymphocytes Relative: 24 %
Lymphs Abs: 1.8 10*3/uL (ref 0.7–4.0)
MCH: 32.5 pg (ref 26.0–34.0)
MCHC: 34.3 g/dL (ref 30.0–36.0)
MCV: 94.8 fL (ref 80.0–100.0)
MONO ABS: 0.5 10*3/uL (ref 0.1–1.0)
Monocytes Relative: 7 %
Neutro Abs: 4.9 10*3/uL (ref 1.7–7.7)
Neutrophils Relative %: 65 %
Platelets: 318 10*3/uL (ref 150–400)
RBC: 4.06 MIL/uL (ref 3.87–5.11)
RDW: 12 % (ref 11.5–15.5)
WBC: 7.6 10*3/uL (ref 4.0–10.5)
nRBC: 0 % (ref 0.0–0.2)

## 2018-12-26 LAB — LACTATE DEHYDROGENASE: LDH: 131 U/L (ref 98–192)

## 2018-12-27 LAB — IGG, IGA, IGM
IGG (IMMUNOGLOBIN G), SERUM: 486 mg/dL — AB (ref 700–1600)
IGM (IMMUNOGLOBULIN M), SRM: 14 mg/dL — AB (ref 26–217)
IgA: 39 mg/dL — ABNORMAL LOW (ref 87–352)

## 2019-01-02 ENCOUNTER — Other Ambulatory Visit: Payer: Self-pay

## 2019-01-02 ENCOUNTER — Inpatient Hospital Stay (HOSPITAL_COMMUNITY): Payer: PPO | Admitting: Internal Medicine

## 2019-01-02 ENCOUNTER — Encounter (HOSPITAL_COMMUNITY): Payer: Self-pay | Admitting: Internal Medicine

## 2019-01-02 VITALS — BP 125/73 | HR 83 | Temp 98.2°F | Resp 18 | Wt 229.6 lb

## 2019-01-02 DIAGNOSIS — E119 Type 2 diabetes mellitus without complications: Secondary | ICD-10-CM

## 2019-01-02 DIAGNOSIS — Z7984 Long term (current) use of oral hypoglycemic drugs: Secondary | ICD-10-CM

## 2019-01-02 DIAGNOSIS — R7989 Other specified abnormal findings of blood chemistry: Secondary | ICD-10-CM | POA: Diagnosis not present

## 2019-01-02 DIAGNOSIS — K760 Fatty (change of) liver, not elsewhere classified: Secondary | ICD-10-CM | POA: Diagnosis not present

## 2019-01-02 DIAGNOSIS — D839 Common variable immunodeficiency, unspecified: Secondary | ICD-10-CM

## 2019-01-02 DIAGNOSIS — Z79899 Other long term (current) drug therapy: Secondary | ICD-10-CM | POA: Diagnosis not present

## 2019-01-02 DIAGNOSIS — Z9049 Acquired absence of other specified parts of digestive tract: Secondary | ICD-10-CM

## 2019-01-02 DIAGNOSIS — I10 Essential (primary) hypertension: Secondary | ICD-10-CM

## 2019-01-02 DIAGNOSIS — Z87891 Personal history of nicotine dependence: Secondary | ICD-10-CM

## 2019-01-02 NOTE — Progress Notes (Signed)
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  Assessment and Plan:  1.  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  This is due to financial reasons based on record.  Labs done 12/26/2018 reviewed and showed Wbc 7.6 HB 13.2 plts 318,000.  Chemistries WNL with K+ 4.7 Cr 0.89.  IgG decreased at 486 compared to level of 597 on labs done 08/2018.  Pt denies any URI symptoms or recurrent Infections.  She will follow-up in 06/2019 with repeat labs.    2.  Elevated LFTs.  AST 59 and ALT 62.  Pt on cholesterol medications.  Abdominal USN done 09/04/2018 reviewed and showed  IMPRESSION: Prior cholecystectomy.  No evidence of biliary ductal dilatation.  Diffuse hepatic steatosis.  Pt should follow-up with PCP for monitoring.    3.  HTN.  BP is 125/73.  Follow-up with PCP.    4.  Health maintenance.  Pt should follow-up with GI and have mammogram screenings as directed.    Current Status:  Pt is seen today for follow-up.  She denies any recent infections.  She is here to go over labs.    Problem List Patient Active Problem List   Diagnosis Date Noted  . Migraine [G43.909] 10/28/2014  . Neck pain of over 3 months duration [M54.2, G89.29] 10/28/2014  . 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 Headache(784.0) [R51] 11/06/2013  . Cervicalgia [M54.2] 11/06/2013  . Acute confusional state [F05] 11/06/2013  . History of stroke in prior 3 months [Z86.73] 10/15/2013  . 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 Unspecified vitamin D deficiency [E55.9] 02/13/2009  . Morbid obesity ([place]) [E66.01] 02/13/2009  . BIPOLAR AFFECTIVE DISORDER [F31.9] 11/19/2007  . Essential hypertension, benign [I10] 11/16/2007  . Mixed hyperlipidemia [E78.2]  10/03/2007  . Hx of tobacco use, presenting hazards to health [Z87.891] 10/03/2007  . DEPRESSION [F32.9] 10/03/2007  . IBS [K58.9] 10/03/2007  . FIBROMYALGIA [IMO0001] 10/03/2007  . Palpitations [R00.2] 10/03/2007    Past Medical History Past Medical History:  Diagnosis Date  . Allergic rhinitis   . Anemia   . Arthritis    Lower back and hips  . Asthma   . Bipolar disorder ([place])   . 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[place])   . Constipation   . 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[place])   . Depression   . Essential hypertension, benign   . Fibromyalgia   . GERD (gastroesophageal reflux disease)   . H/O sleep apnea   . Hip pain, left   . History of palpitations    Negative Holter monitor  . History of pneumonia 1988  . Hyperlipidemia   . IBS (irritable bowel syndrome)   . Neck pain   . Poor short term memory   . PTSD (post-traumatic stress disorder)   . S/P Botox injection 11/2014    For migraine headaches  . Type 2 diabetes mellitus ([place])   . Urgency of urination     Past Surgical History Past Surgical History:  Procedure Laterality Date  . CARPAL TUNNEL RELEASE Right 06/10/2013   Procedure: CARPAL TUNNEL RELEASE;  Surgeon: Faythe Ghee, MD;  Location: [facility];  Service: Neurosurgery;  Laterality: Right;  Right Carpal Tunnel Release   . CHOLECYSTECTOMY    . DENTAL SURGERY  12/2014  . mole exc     x  3  . MUSCLE BIOPSY  2008   Right leg  . TUBAL LIGATION      Family History Family History  Problem Relation Age of Onset  . Fibromyalgia Mother   . Diabetes type II Mother   . Depression Mother   . Alzheimer's disease Mother   . GER disease Mother   . Heart disease Mother   . Paranoid behavior Mother   . Dementia Mother   . Heart attack Father 31       MI x5  . Stroke Father        CVA x7  . Asthma Father   . Brain cancer Father   . Seizures Father   . Anxiety disorder Sister   . OCD Sister   . Sexual abuse Sister   . Physical abuse Sister   .  Anxiety disorder Sister   . ADD / ADHD Daughter   . ADD / ADHD Daughter   . Alcohol abuse Neg Hx   . Drug abuse Neg Hx   . Schizophrenia Neg Hx      Social History  reports that she quit smoking about 8 years ago. Her smoking use included cigarettes. She has never used smokeless tobacco. She reports current alcohol use. She reports that she does not use drugs.  Medications  Current Outpatient Medications:  .  albuterol (PROVENTIL HFA;VENTOLIN HFA) 108 (90 BASE) MCG/ACT inhaler, Inhale 2 puffs into the lungs every 6 (six) hours as needed for wheezing or shortness of breath., Disp: , Rfl:  .  benztropine (COGENTIN) 0.5 MG tablet, Take 1 tablet (0.5 mg total) by mouth at bedtime., Disp: 90 tablet, Rfl: 2 .  cholecalciferol (VITAMIN D3) 25 MCG (1000 UT) tablet, Take 5,000 Units by mouth daily., Disp: , Rfl:  .  clonazePAM (KLONOPIN) 0.5 MG tablet, Take 1 tablet (0.5 mg total) by mouth 2 (two) times daily as needed for anxiety., Disp: 180 tablet, Rfl: 1 .  fenofibrate 160 MG tablet, , Disp: , Rfl:  .  FLUoxetine (PROZAC) 40 MG capsule, Take 1 capsule (40 mg total) by mouth daily., Disp: 90 capsule, Rfl: 2 .  fluticasone (FLONASE) 50 MCG/ACT nasal spray, Place 2 sprays into both nostrils daily., Disp: , Rfl:  .  gabapentin (NEURONTIN) 300 MG capsule, Take 300 mg by mouth 3 (three) times daily., Disp: , Rfl:  .  HYDROcodone-acetaminophen (NORCO) 7.5-325 MG tablet, 1 tablet every 4 (four) hours as needed. , Disp: , Rfl:  .  Lancets (ONETOUCH ULTRASOFT) lancets, 1 each by Other route as needed. , Disp: , Rfl:  .  Melatonin 5 MG TABS, Take 5 mg by mouth at bedtime as needed. , Disp: , Rfl:  .  metFORMIN (GLUCOPHAGE) 1000 MG tablet, , Disp: , Rfl:  .  Multiple Vitamin (MULTIVITAMIN) tablet, Take 1 tablet by mouth daily. Reported on 02/10/2016, Disp: , Rfl:  .  naproxen sodium (ALEVE) 220 MG tablet, Take 220 mg by mouth 2 (two) times daily as needed., Disp: , Rfl:  .  omeprazole (PRILOSEC) 20 MG  capsule, Take 20 mg by mouth daily. , Disp: , Rfl:  .  ONE TOUCH ULTRA TEST test strip, , Disp: , Rfl:  .  polyethylene glycol powder (GLYCOLAX/MIRALAX) powder, Use 1 capful or 1/2 capful daily (Patient taking differently: Use 1 capful PRN), Disp: 500 g, Rfl: 2 .  pravastatin (PRAVACHOL) 40 MG tablet, Take 40 mg by mouth daily., Disp: , Rfl:  .  prazosin (MINIPRESS) 2 MG capsule, Take 1  capsule (2 mg total) by mouth at bedtime., Disp: 90 capsule, Rfl: 2 .  risperiDONE (RISPERDAL) 1 MG tablet, Take 1 tablet (1 mg total) by mouth at bedtime., Disp: 90 tablet, Rfl: 2 .  tiZANidine (ZANAFLEX) 4 MG tablet, Take 4 mg by mouth daily. 4mg  at bedtime and 2 mg during the day, Disp: , Rfl:  .  fexofenadine (ALLEGRA) 30 MG tablet, Take 30 mg by mouth daily., Disp: , Rfl:   Allergies Mold extract  [trichophyton]; Other; Sulfa antibiotics; Sulfonamide derivatives; Gluten meal; Molds & smuts; Clonazepam; Dust mite extract; and Tegretol [carbamazepine]  Review of Systems Review of Systems - Oncology ROS negative   Physical Exam  Vitals Wt Readings from Last 3 Encounters:  01/02/19 229 lb 9.6 oz (104.1 kg)  08/28/18 236 lb (107 kg)  04/12/18 230 lb (104.3 kg)   Temp Readings from Last 3 Encounters:  01/02/19 98.2 F (36.8 C) (Oral)  08/28/18 97.7 F (36.5 C) (Oral)  04/12/18 98.4 F (36.9 C) (Oral)   BP Readings from Last 3 Encounters:  01/02/19 125/73  08/28/18 114/64  04/12/18 (!) 101/45   Pulse Readings from Last 3 Encounters:  01/02/19 83  08/28/18 77  04/12/18 71   Constitutional: Well-developed, well-nourished, and in no distress.   HENT: Head: Normocephalic and atraumatic.  Mouth/Throat: No oropharyngeal exudate. Mucosa moist. Eyes: Pupils are equal, round, and reactive to light. Conjunctivae are normal. No scleral icterus.  Neck: Normal range of motion. Neck supple. No JVD present.  Cardiovascular: Normal rate, regular rhythm and normal heart sounds.  Exam reveals no gallop and  no friction rub.   No murmur heard. Pulmonary/Chest: Effort normal and breath sounds normal. No respiratory distress. No wheezes.No rales.  Abdominal: Soft. Bowel sounds are normal. No distension. There is no tenderness. There is no guarding.  Musculoskeletal: No edema or tenderness.  Lymphadenopathy: No cervical, axillary or supraclavicular adenopathy.  Neurological: Alert and oriented to person, place, and time. No cranial nerve deficit.  Skin: Skin is warm and dry. No rash noted. No erythema. No pallor.  Psychiatric: Affect and judgment normal.   Labs No visits with results within 3 Day(s) from this visit.  Latest known visit with results is:  Appointment on 12/26/2018  Component Date Value Ref Range Status  . WBC 12/26/2018 7.6  4.0 - 10.5 K/uL Final  . RBC 12/26/2018 4.06  3.87 - 5.11 MIL/uL Final  . Hemoglobin 12/26/2018 13.2  12.0 - 15.0 g/dL Final  . HCT 12/26/2018 38.5  36.0 - 46.0 % Final  . MCV 12/26/2018 94.8  80.0 - 100.0 fL Final  . MCH 12/26/2018 32.5  26.0 - 34.0 pg Final  . MCHC 12/26/2018 34.3  30.0 - 36.0 g/dL Final  . RDW 12/26/2018 12.0  11.5 - 15.5 % Final  . Platelets 12/26/2018 318  150 - 400 K/uL Final  . nRBC 12/26/2018 0.0  0.0 - 0.2 % Final  . Neutrophils Relative % 12/26/2018 65  % Final  . Neutro Abs 12/26/2018 4.9  1.7 - 7.7 K/uL Final  . Lymphocytes Relative 12/26/2018 24  % Final  . Lymphs Abs 12/26/2018 1.8  0.7 - 4.0 K/uL Final  . Monocytes Relative 12/26/2018 7  % Final  . Monocytes Absolute 12/26/2018 0.5  0.1 - 1.0 K/uL Final  . Eosinophils Relative 12/26/2018 3  % Final  . Eosinophils Absolute 12/26/2018 0.2  0.0 - 0.5 K/uL Final  . Basophils Relative 12/26/2018 1  % Final  . Basophils Absolute 12/26/2018  0.1  0.0 - 0.1 K/uL Final  . Immature Granulocytes 12/26/2018 0  % Final  . Abs Immature Granulocytes 12/26/2018 0.03  0.00 - 0.07 K/uL Final   Performed at [facility], [address], [place], [place] [address]  . Sodium 12/26/2018 135   135 - 145 mmol/L Final  . Potassium 12/26/2018 4.7  3.5 - 5.1 mmol/L Final  . Chloride 12/26/2018 101  98 - 111 mmol/L Final  . CO2 12/26/2018 26  22 - 32 mmol/L Final  . Glucose, Bld 12/26/2018 198* 70 - 99 mg/dL Final  . BUN 12/26/2018 9  6 - 20 mg/dL Final  . Creatinine, Ser 12/26/2018 0.89  0.44 - 1.00 mg/dL Final  . Calcium 12/26/2018 9.8  8.9 - 10.3 mg/dL Final  . Total Protein 12/26/2018 6.8  6.5 - 8.1 g/dL Final  . Albumin 12/26/2018 4.4  3.5 - 5.0 g/dL Final  . AST 12/26/2018 59* 15 - 41 U/L Final  . ALT 12/26/2018 62* 0 - 44 U/L Final  . Alkaline Phosphatase 12/26/2018 30* 38 - 126 U/L Final  . Total Bilirubin 12/26/2018 0.6  0.3 - 1.2 mg/dL Final  . GFR calc non Af Amer 12/26/2018 >60  >60 mL/min Final  . GFR calc Af Amer 12/26/2018 >60  >60 mL/min Final  . Anion gap 12/26/2018 8  5 - 15 Final   Performed at [facility], [address], [place], [place] [address]  . LDH 12/26/2018 131  98 - 192 U/L Final   Performed at [facility], [address], [place],  [address]  . IgG (Immunoglobin G), Serum 12/26/2018 486* 700 - 1,600 mg/dL Final  . IgA 12/26/2018 39* 87 - 352 mg/dL Final   Result confirmed on concentration.  . IgM (Immunoglobulin M), Srm 12/26/2018 14* 26 - 217 mg/dL Final   Comment: (NOTE) Result confirmed on concentration. Performed At: [facility] [place], [place] [id number] Rush Farmer MD [id number]:[id number]      Pathology Orders Placed This Encounter  Procedures  . CBC with Differential/Platelet    Standing Status:   Future    Standing Expiration Date:   01/03/2020  . Comprehensive metabolic panel    Standing Status:   Future    Standing Expiration Date:   01/03/2020  . Lactate dehydrogenase    Standing Status:   Future    Standing Expiration Date:   01/03/2020  . IgG, IgA, IgM    Standing Status:   Future    Standing Expiration Date:   01/03/2020       Zoila Shutter MD

## 2019-01-04 DIAGNOSIS — N3001 Acute cystitis with hematuria: Secondary | ICD-10-CM | POA: Diagnosis not present

## 2019-01-04 DIAGNOSIS — R300 Dysuria: Secondary | ICD-10-CM | POA: Diagnosis not present

## 2019-01-08 DIAGNOSIS — F419 Anxiety disorder, unspecified: Secondary | ICD-10-CM | POA: Diagnosis not present

## 2019-01-08 DIAGNOSIS — F514 Sleep terrors [night terrors]: Secondary | ICD-10-CM | POA: Diagnosis not present

## 2019-01-08 DIAGNOSIS — E559 Vitamin D deficiency, unspecified: Secondary | ICD-10-CM | POA: Diagnosis not present

## 2019-01-08 DIAGNOSIS — F339 Major depressive disorder, recurrent, unspecified: Secondary | ICD-10-CM | POA: Diagnosis not present

## 2019-01-08 DIAGNOSIS — E119 Type 2 diabetes mellitus without complications: Secondary | ICD-10-CM | POA: Diagnosis not present

## 2019-01-08 DIAGNOSIS — E039 Hypothyroidism, unspecified: Secondary | ICD-10-CM | POA: Diagnosis not present

## 2019-01-08 DIAGNOSIS — F3181 Bipolar II disorder: Secondary | ICD-10-CM | POA: Diagnosis not present

## 2019-01-08 DIAGNOSIS — E782 Mixed hyperlipidemia: Secondary | ICD-10-CM | POA: Diagnosis not present

## 2019-01-16 ENCOUNTER — Ambulatory Visit (HOSPITAL_COMMUNITY)
Admission: RE | Admit: 2019-01-16 | Discharge: 2019-01-16 | Disposition: A | Discharge status: Home / Self Care | Payer: PPO | Source: Ambulatory Visit | Attending: Internal Medicine | Admitting: Internal Medicine

## 2019-01-16 DIAGNOSIS — Z1231 Encounter for screening mammogram for malignant neoplasm of breast: Secondary | ICD-10-CM | POA: Insufficient documentation

## 2019-01-21 DIAGNOSIS — N390 Urinary tract infection, site not specified: Secondary | ICD-10-CM | POA: Diagnosis not present

## 2019-01-21 DIAGNOSIS — D839 Common variable immunodeficiency, unspecified: Secondary | ICD-10-CM | POA: Diagnosis not present

## 2019-01-21 DIAGNOSIS — L309 Dermatitis, unspecified: Secondary | ICD-10-CM | POA: Diagnosis not present

## 2019-01-21 DIAGNOSIS — M545 Low back pain: Secondary | ICD-10-CM | POA: Diagnosis not present

## 2019-01-21 DIAGNOSIS — E039 Hypothyroidism, unspecified: Secondary | ICD-10-CM | POA: Diagnosis not present

## 2019-01-21 DIAGNOSIS — D509 Iron deficiency anemia, unspecified: Secondary | ICD-10-CM | POA: Diagnosis not present

## 2019-01-21 DIAGNOSIS — Z6831 Body mass index (BMI) 31.0-31.9, adult: Secondary | ICD-10-CM | POA: Diagnosis not present

## 2019-01-21 DIAGNOSIS — E782 Mixed hyperlipidemia: Secondary | ICD-10-CM | POA: Diagnosis not present

## 2019-01-21 DIAGNOSIS — G589 Mononeuropathy, unspecified: Secondary | ICD-10-CM | POA: Diagnosis not present

## 2019-01-21 DIAGNOSIS — S60949A Unspecified superficial injury of unspecified finger, initial encounter: Secondary | ICD-10-CM | POA: Diagnosis not present

## 2019-01-21 DIAGNOSIS — R21 Rash and other nonspecific skin eruption: Secondary | ICD-10-CM | POA: Diagnosis not present

## 2019-01-21 DIAGNOSIS — I10 Essential (primary) hypertension: Secondary | ICD-10-CM | POA: Diagnosis not present

## 2019-01-21 DIAGNOSIS — R300 Dysuria: Secondary | ICD-10-CM | POA: Diagnosis not present

## 2019-01-21 DIAGNOSIS — E6609 Other obesity due to excess calories: Secondary | ICD-10-CM | POA: Diagnosis not present

## 2019-01-21 DIAGNOSIS — Z23 Encounter for immunization: Secondary | ICD-10-CM | POA: Diagnosis not present

## 2019-01-21 DIAGNOSIS — B379 Candidiasis, unspecified: Secondary | ICD-10-CM | POA: Diagnosis not present

## 2019-02-13 DIAGNOSIS — F319 Bipolar disorder, unspecified: Secondary | ICD-10-CM | POA: Diagnosis not present

## 2019-02-13 DIAGNOSIS — I10 Essential (primary) hypertension: Secondary | ICD-10-CM | POA: Diagnosis not present

## 2019-02-13 DIAGNOSIS — E1169 Type 2 diabetes mellitus with other specified complication: Secondary | ICD-10-CM | POA: Diagnosis not present

## 2019-02-13 DIAGNOSIS — K219 Gastro-esophageal reflux disease without esophagitis: Secondary | ICD-10-CM | POA: Diagnosis not present

## 2019-02-13 DIAGNOSIS — E782 Mixed hyperlipidemia: Secondary | ICD-10-CM | POA: Diagnosis not present

## 2019-02-13 DIAGNOSIS — E119 Type 2 diabetes mellitus without complications: Secondary | ICD-10-CM | POA: Diagnosis not present

## 2019-02-13 DIAGNOSIS — D509 Iron deficiency anemia, unspecified: Secondary | ICD-10-CM | POA: Diagnosis not present

## 2019-02-13 DIAGNOSIS — F339 Major depressive disorder, recurrent, unspecified: Secondary | ICD-10-CM | POA: Diagnosis not present

## 2019-02-13 DIAGNOSIS — E039 Hypothyroidism, unspecified: Secondary | ICD-10-CM | POA: Diagnosis not present

## 2019-02-13 DIAGNOSIS — E1165 Type 2 diabetes mellitus with hyperglycemia: Secondary | ICD-10-CM | POA: Diagnosis not present

## 2019-03-06 DIAGNOSIS — E1169 Type 2 diabetes mellitus with other specified complication: Secondary | ICD-10-CM | POA: Diagnosis not present

## 2019-03-06 DIAGNOSIS — K219 Gastro-esophageal reflux disease without esophagitis: Secondary | ICD-10-CM | POA: Diagnosis not present

## 2019-03-06 DIAGNOSIS — D509 Iron deficiency anemia, unspecified: Secondary | ICD-10-CM | POA: Diagnosis not present

## 2019-03-06 DIAGNOSIS — F319 Bipolar disorder, unspecified: Secondary | ICD-10-CM | POA: Diagnosis not present

## 2019-03-06 DIAGNOSIS — F339 Major depressive disorder, recurrent, unspecified: Secondary | ICD-10-CM | POA: Diagnosis not present

## 2019-03-06 DIAGNOSIS — E119 Type 2 diabetes mellitus without complications: Secondary | ICD-10-CM | POA: Diagnosis not present

## 2019-03-06 DIAGNOSIS — E1165 Type 2 diabetes mellitus with hyperglycemia: Secondary | ICD-10-CM | POA: Diagnosis not present

## 2019-03-06 DIAGNOSIS — E039 Hypothyroidism, unspecified: Secondary | ICD-10-CM | POA: Diagnosis not present

## 2019-03-06 DIAGNOSIS — I10 Essential (primary) hypertension: Secondary | ICD-10-CM | POA: Diagnosis not present

## 2019-03-06 DIAGNOSIS — E782 Mixed hyperlipidemia: Secondary | ICD-10-CM | POA: Diagnosis not present

## 2019-03-22 ENCOUNTER — Ambulatory Visit (INDEPENDENT_AMBULATORY_CARE_PROVIDER_SITE_OTHER): Payer: PPO | Admitting: Psychiatry

## 2019-03-22 ENCOUNTER — Encounter (HOSPITAL_COMMUNITY): Payer: Self-pay | Admitting: Psychiatry

## 2019-03-22 ENCOUNTER — Other Ambulatory Visit: Payer: Self-pay

## 2019-03-22 DIAGNOSIS — Z79899 Other long term (current) drug therapy: Secondary | ICD-10-CM | POA: Diagnosis not present

## 2019-03-22 DIAGNOSIS — F332 Major depressive disorder, recurrent severe without psychotic features: Secondary | ICD-10-CM

## 2019-03-22 MED ORDER — FLUOXETINE HCL 40 MG PO CAPS: 40.0000 mg | ORAL_CAPSULE | Freq: Every day | ORAL | 2 refills | Status: DC

## 2019-03-22 MED ORDER — RISPERIDONE 1 MG PO TABS: 1.0000 mg | ORAL_TABLET | Freq: Every day | ORAL | 2 refills | Status: DC

## 2019-03-22 MED ORDER — PRAZOSIN HCL 2 MG PO CAPS: 2.0000 mg | ORAL_CAPSULE | Freq: Every day | ORAL | 2 refills | Status: DC

## 2019-03-22 MED ORDER — CLONAZEPAM 0.5 MG PO TABS: 0.5000 mg | ORAL_TABLET | Freq: Two times a day (BID) | ORAL | 1 refills | Status: DC | PRN
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
## 2019-03-28 DIAGNOSIS — E039 Hypothyroidism, unspecified: Secondary | ICD-10-CM | POA: Diagnosis not present

## 2019-03-28 DIAGNOSIS — E1165 Type 2 diabetes mellitus with hyperglycemia: Secondary | ICD-10-CM | POA: Diagnosis not present

## 2019-03-28 DIAGNOSIS — E559 Vitamin D deficiency, unspecified: Secondary | ICD-10-CM | POA: Diagnosis not present

## 2019-03-28 DIAGNOSIS — E782 Mixed hyperlipidemia: Secondary | ICD-10-CM | POA: Diagnosis not present

## 2019-03-28 DIAGNOSIS — R945 Abnormal results of liver function studies: Secondary | ICD-10-CM | POA: Diagnosis not present

## 2019-03-28 DIAGNOSIS — D509 Iron deficiency anemia, unspecified: Secondary | ICD-10-CM | POA: Diagnosis not present

## 2019-03-28 DIAGNOSIS — E119 Type 2 diabetes mellitus without complications: Secondary | ICD-10-CM | POA: Diagnosis not present

## 2019-03-28 DIAGNOSIS — E1169 Type 2 diabetes mellitus with other specified complication: Secondary | ICD-10-CM | POA: Diagnosis not present

## 2019-03-28 DIAGNOSIS — I10 Essential (primary) hypertension: Secondary | ICD-10-CM | POA: Diagnosis not present

## 2019-04-01 DIAGNOSIS — M797 Fibromyalgia: Secondary | ICD-10-CM | POA: Diagnosis not present

## 2019-04-01 DIAGNOSIS — E1169 Type 2 diabetes mellitus with other specified complication: Secondary | ICD-10-CM | POA: Diagnosis not present

## 2019-04-01 DIAGNOSIS — G4730 Sleep apnea, unspecified: Secondary | ICD-10-CM | POA: Diagnosis not present

## 2019-04-01 DIAGNOSIS — E039 Hypothyroidism, unspecified: Secondary | ICD-10-CM | POA: Diagnosis not present

## 2019-04-01 DIAGNOSIS — E559 Vitamin D deficiency, unspecified: Secondary | ICD-10-CM | POA: Diagnosis not present

## 2019-04-01 DIAGNOSIS — E782 Mixed hyperlipidemia: Secondary | ICD-10-CM | POA: Diagnosis not present

## 2019-04-01 DIAGNOSIS — K760 Fatty (change of) liver, not elsewhere classified: Secondary | ICD-10-CM | POA: Diagnosis not present

## 2019-04-01 DIAGNOSIS — D839 Common variable immunodeficiency, unspecified: Secondary | ICD-10-CM | POA: Diagnosis not present

## 2019-04-01 DIAGNOSIS — F3181 Bipolar II disorder: Secondary | ICD-10-CM | POA: Diagnosis not present

## 2019-04-01 DIAGNOSIS — D509 Iron deficiency anemia, unspecified: Secondary | ICD-10-CM | POA: Diagnosis not present

## 2019-04-01 DIAGNOSIS — J4520 Mild intermittent asthma, uncomplicated: Secondary | ICD-10-CM | POA: Diagnosis not present

## 2019-04-12 DIAGNOSIS — E782 Mixed hyperlipidemia: Secondary | ICD-10-CM | POA: Diagnosis not present

## 2019-04-12 DIAGNOSIS — E039 Hypothyroidism, unspecified: Secondary | ICD-10-CM | POA: Diagnosis not present

## 2019-04-12 DIAGNOSIS — E1169 Type 2 diabetes mellitus with other specified complication: Secondary | ICD-10-CM | POA: Diagnosis not present

## 2019-04-12 DIAGNOSIS — J4520 Mild intermittent asthma, uncomplicated: Secondary | ICD-10-CM | POA: Diagnosis not present

## 2019-04-22 DIAGNOSIS — Z0000 Encounter for general adult medical examination without abnormal findings: Secondary | ICD-10-CM | POA: Diagnosis not present

## 2019-04-24 DIAGNOSIS — R300 Dysuria: Secondary | ICD-10-CM | POA: Diagnosis not present

## 2019-04-24 DIAGNOSIS — B379 Candidiasis, unspecified: Secondary | ICD-10-CM | POA: Diagnosis not present

## 2019-04-24 DIAGNOSIS — E875 Hyperkalemia: Secondary | ICD-10-CM | POA: Diagnosis not present

## 2019-04-24 DIAGNOSIS — D839 Common variable immunodeficiency, unspecified: Secondary | ICD-10-CM | POA: Diagnosis not present

## 2019-04-24 DIAGNOSIS — E6609 Other obesity due to excess calories: Secondary | ICD-10-CM | POA: Diagnosis not present

## 2019-04-24 DIAGNOSIS — E039 Hypothyroidism, unspecified: Secondary | ICD-10-CM | POA: Diagnosis not present

## 2019-04-24 DIAGNOSIS — R358 Other polyuria: Secondary | ICD-10-CM | POA: Diagnosis not present

## 2019-04-24 DIAGNOSIS — E559 Vitamin D deficiency, unspecified: Secondary | ICD-10-CM | POA: Diagnosis not present

## 2019-04-24 DIAGNOSIS — E1169 Type 2 diabetes mellitus with other specified complication: Secondary | ICD-10-CM | POA: Diagnosis not present

## 2019-04-24 DIAGNOSIS — E119 Type 2 diabetes mellitus without complications: Secondary | ICD-10-CM | POA: Diagnosis not present

## 2019-04-24 DIAGNOSIS — N390 Urinary tract infection, site not specified: Secondary | ICD-10-CM | POA: Diagnosis not present

## 2019-04-24 DIAGNOSIS — E782 Mixed hyperlipidemia: Secondary | ICD-10-CM | POA: Diagnosis not present

## 2019-04-24 DIAGNOSIS — E1165 Type 2 diabetes mellitus with hyperglycemia: Secondary | ICD-10-CM | POA: Diagnosis not present

## 2019-04-24 DIAGNOSIS — D509 Iron deficiency anemia, unspecified: Secondary | ICD-10-CM | POA: Diagnosis not present

## 2019-04-24 DIAGNOSIS — Z0000 Encounter for general adult medical examination without abnormal findings: Secondary | ICD-10-CM | POA: Diagnosis not present

## 2019-04-24 DIAGNOSIS — R3915 Urgency of urination: Secondary | ICD-10-CM | POA: Diagnosis not present

## 2019-05-10 DIAGNOSIS — E875 Hyperkalemia: Secondary | ICD-10-CM | POA: Diagnosis not present

## 2019-05-10 DIAGNOSIS — D509 Iron deficiency anemia, unspecified: Secondary | ICD-10-CM | POA: Diagnosis not present

## 2019-05-10 DIAGNOSIS — R3915 Urgency of urination: Secondary | ICD-10-CM | POA: Diagnosis not present

## 2019-05-10 DIAGNOSIS — E782 Mixed hyperlipidemia: Secondary | ICD-10-CM | POA: Diagnosis not present

## 2019-05-10 DIAGNOSIS — B379 Candidiasis, unspecified: Secondary | ICD-10-CM | POA: Diagnosis not present

## 2019-05-10 DIAGNOSIS — E6609 Other obesity due to excess calories: Secondary | ICD-10-CM | POA: Diagnosis not present

## 2019-05-10 DIAGNOSIS — E559 Vitamin D deficiency, unspecified: Secondary | ICD-10-CM | POA: Diagnosis not present

## 2019-05-10 DIAGNOSIS — D839 Common variable immunodeficiency, unspecified: Secondary | ICD-10-CM | POA: Diagnosis not present

## 2019-05-10 DIAGNOSIS — E039 Hypothyroidism, unspecified: Secondary | ICD-10-CM | POA: Diagnosis not present

## 2019-05-10 DIAGNOSIS — N390 Urinary tract infection, site not specified: Secondary | ICD-10-CM | POA: Diagnosis not present

## 2019-05-10 DIAGNOSIS — R358 Other polyuria: Secondary | ICD-10-CM | POA: Diagnosis not present

## 2019-05-10 DIAGNOSIS — E119 Type 2 diabetes mellitus without complications: Secondary | ICD-10-CM | POA: Diagnosis not present

## 2019-05-10 DIAGNOSIS — R300 Dysuria: Secondary | ICD-10-CM | POA: Diagnosis not present

## 2019-05-10 DIAGNOSIS — E1165 Type 2 diabetes mellitus with hyperglycemia: Secondary | ICD-10-CM | POA: Diagnosis not present

## 2019-05-10 DIAGNOSIS — E1169 Type 2 diabetes mellitus with other specified complication: Secondary | ICD-10-CM | POA: Diagnosis not present

## 2019-05-16 DIAGNOSIS — D509 Iron deficiency anemia, unspecified: Secondary | ICD-10-CM | POA: Diagnosis not present

## 2019-05-16 DIAGNOSIS — K219 Gastro-esophageal reflux disease without esophagitis: Secondary | ICD-10-CM | POA: Diagnosis not present

## 2019-05-16 DIAGNOSIS — E1169 Type 2 diabetes mellitus with other specified complication: Secondary | ICD-10-CM | POA: Diagnosis not present

## 2019-05-16 DIAGNOSIS — E119 Type 2 diabetes mellitus without complications: Secondary | ICD-10-CM | POA: Diagnosis not present

## 2019-05-16 DIAGNOSIS — I10 Essential (primary) hypertension: Secondary | ICD-10-CM | POA: Diagnosis not present

## 2019-05-16 DIAGNOSIS — E1165 Type 2 diabetes mellitus with hyperglycemia: Secondary | ICD-10-CM | POA: Diagnosis not present

## 2019-05-16 DIAGNOSIS — F319 Bipolar disorder, unspecified: Secondary | ICD-10-CM | POA: Diagnosis not present

## 2019-05-16 DIAGNOSIS — E039 Hypothyroidism, unspecified: Secondary | ICD-10-CM | POA: Diagnosis not present

## 2019-05-16 DIAGNOSIS — E782 Mixed hyperlipidemia: Secondary | ICD-10-CM | POA: Diagnosis not present

## 2019-05-16 DIAGNOSIS — F339 Major depressive disorder, recurrent, unspecified: Secondary | ICD-10-CM | POA: Diagnosis not present

## 2019-05-23 ENCOUNTER — Ambulatory Visit (HOSPITAL_COMMUNITY): Payer: PPO | Admitting: Psychiatry

## 2019-05-31 ENCOUNTER — Encounter (HOSPITAL_COMMUNITY): Payer: Self-pay | Admitting: Psychiatry

## 2019-05-31 ENCOUNTER — Ambulatory Visit (INDEPENDENT_AMBULATORY_CARE_PROVIDER_SITE_OTHER): Payer: PPO | Admitting: Psychiatry

## 2019-05-31 ENCOUNTER — Other Ambulatory Visit: Payer: Self-pay

## 2019-05-31 DIAGNOSIS — F332 Major depressive disorder, recurrent severe without psychotic features: Secondary | ICD-10-CM | POA: Diagnosis not present

## 2019-05-31 MED ORDER — RISPERIDONE 1 MG PO TABS: 1.0000 mg | ORAL_TABLET | Freq: Every day | ORAL | 2 refills | Status: DC

## 2019-05-31 MED ORDER — FLUOXETINE HCL 40 MG PO CAPS: 40.0000 mg | ORAL_CAPSULE | Freq: Every day | ORAL | 2 refills | Status: DC

## 2019-05-31 MED ORDER — CLONAZEPAM 0.5 MG PO TABS: 0.5000 mg | ORAL_TABLET | Freq: Two times a day (BID) | ORAL | 1 refills | Status: DC | PRN

## 2019-05-31 MED ORDER — PRAZOSIN HCL 2 MG PO CAPS: 2.0000 mg | ORAL_CAPSULE | Freq: Every day | ORAL | 2 refills | Status: DC

## 2019-05-31 MED ORDER — BENZTROPINE MESYLATE 0.5 MG PO TABS: 0.5000 mg | ORAL_TABLET | Freq: Every day | ORAL | 2 refills | Status: DC
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
## 2019-06-07 DIAGNOSIS — E119 Type 2 diabetes mellitus without complications: Secondary | ICD-10-CM | POA: Diagnosis not present

## 2019-06-07 DIAGNOSIS — E039 Hypothyroidism, unspecified: Secondary | ICD-10-CM | POA: Diagnosis not present

## 2019-06-07 DIAGNOSIS — I10 Essential (primary) hypertension: Secondary | ICD-10-CM | POA: Diagnosis not present

## 2019-06-07 DIAGNOSIS — F339 Major depressive disorder, recurrent, unspecified: Secondary | ICD-10-CM | POA: Diagnosis not present

## 2019-06-07 DIAGNOSIS — D509 Iron deficiency anemia, unspecified: Secondary | ICD-10-CM | POA: Diagnosis not present

## 2019-06-07 DIAGNOSIS — E1165 Type 2 diabetes mellitus with hyperglycemia: Secondary | ICD-10-CM | POA: Diagnosis not present

## 2019-06-07 DIAGNOSIS — E1169 Type 2 diabetes mellitus with other specified complication: Secondary | ICD-10-CM | POA: Diagnosis not present

## 2019-06-07 DIAGNOSIS — F319 Bipolar disorder, unspecified: Secondary | ICD-10-CM | POA: Diagnosis not present

## 2019-06-07 DIAGNOSIS — E782 Mixed hyperlipidemia: Secondary | ICD-10-CM | POA: Diagnosis not present

## 2019-06-07 DIAGNOSIS — K219 Gastro-esophageal reflux disease without esophagitis: Secondary | ICD-10-CM | POA: Diagnosis not present

## 2019-07-01 ENCOUNTER — Other Ambulatory Visit: Payer: Self-pay

## 2019-07-01 ENCOUNTER — Encounter (HOSPITAL_COMMUNITY): Payer: Self-pay | Admitting: Psychiatry

## 2019-07-01 ENCOUNTER — Ambulatory Visit (INDEPENDENT_AMBULATORY_CARE_PROVIDER_SITE_OTHER): Payer: PPO | Admitting: Psychiatry

## 2019-07-01 DIAGNOSIS — F332 Major depressive disorder, recurrent severe without psychotic features: Secondary | ICD-10-CM | POA: Diagnosis not present

## 2019-07-01 MED ORDER — PRAZOSIN HCL 2 MG PO CAPS: 2.0000 mg | ORAL_CAPSULE | Freq: Every day | ORAL | 2 refills | Status: DC

## 2019-07-01 MED ORDER — RISPERIDONE 1 MG PO TABS: 1.0000 mg | ORAL_TABLET | Freq: Every day | ORAL | 2 refills | Status: DC

## 2019-07-01 MED ORDER — FLUOXETINE HCL 40 MG PO CAPS: 40.0000 mg | ORAL_CAPSULE | Freq: Every day | ORAL | 2 refills | Status: DC

## 2019-07-01 MED ORDER — BENZTROPINE MESYLATE 0.5 MG PO TABS: 0.5000 mg | ORAL_TABLET | Freq: Every day | ORAL | 2 refills | Status: DC

## 2019-07-01 MED ORDER — CLONAZEPAM 0.5 MG PO TABS: 0.5000 mg | ORAL_TABLET | Freq: Two times a day (BID) | ORAL | 1 refills | Status: DC | PRN
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
## 2019-07-02 ENCOUNTER — Other Ambulatory Visit (HOSPITAL_COMMUNITY): Payer: Self-pay | Admitting: *Deleted

## 2019-07-02 DIAGNOSIS — D839 Common variable immunodeficiency, unspecified: Secondary | ICD-10-CM

## 2019-07-03 ENCOUNTER — Other Ambulatory Visit: Payer: Self-pay

## 2019-07-03 ENCOUNTER — Inpatient Hospital Stay (HOSPITAL_COMMUNITY): Payer: PPO | Attending: Hematology

## 2019-07-03 DIAGNOSIS — Z87891 Personal history of nicotine dependence: Secondary | ICD-10-CM | POA: Diagnosis not present

## 2019-07-03 DIAGNOSIS — K760 Fatty (change of) liver, not elsewhere classified: Secondary | ICD-10-CM | POA: Diagnosis not present

## 2019-07-03 DIAGNOSIS — Z79899 Other long term (current) drug therapy: Secondary | ICD-10-CM | POA: Insufficient documentation

## 2019-07-03 DIAGNOSIS — D839 Common variable immunodeficiency, unspecified: Secondary | ICD-10-CM | POA: Diagnosis not present

## 2019-07-03 DIAGNOSIS — R7989 Other specified abnormal findings of blood chemistry: Secondary | ICD-10-CM | POA: Diagnosis not present

## 2019-07-03 DIAGNOSIS — I10 Essential (primary) hypertension: Secondary | ICD-10-CM | POA: Insufficient documentation

## 2019-07-03 DIAGNOSIS — E119 Type 2 diabetes mellitus without complications: Secondary | ICD-10-CM | POA: Diagnosis not present

## 2019-07-03 DIAGNOSIS — Z9049 Acquired absence of other specified parts of digestive tract: Secondary | ICD-10-CM | POA: Diagnosis not present

## 2019-07-03 DIAGNOSIS — Z7984 Long term (current) use of oral hypoglycemic drugs: Secondary | ICD-10-CM | POA: Insufficient documentation

## 2019-07-03 LAB — CBC WITH DIFFERENTIAL/PLATELET
Abs Immature Granulocytes: 0.02 10*3/uL (ref 0.00–0.07)
Basophils Absolute: 0.1 10*3/uL (ref 0.0–0.1)
Basophils Relative: 1 %
Eosinophils Absolute: 0.3 10*3/uL (ref 0.0–0.5)
Eosinophils Relative: 3 %
HCT: 42.8 % (ref 36.0–46.0)
Hemoglobin: 14.7 g/dL (ref 12.0–15.0)
Immature Granulocytes: 0 %
Lymphocytes Relative: 25 %
Lymphs Abs: 2.2 10*3/uL (ref 0.7–4.0)
MCH: 31.7 pg (ref 26.0–34.0)
MCHC: 34.3 g/dL (ref 30.0–36.0)
MCV: 92.2 fL (ref 80.0–100.0)
Monocytes Absolute: 0.5 10*3/uL (ref 0.1–1.0)
Monocytes Relative: 6 %
Neutro Abs: 5.9 10*3/uL (ref 1.7–7.7)
Neutrophils Relative %: 65 %
Platelets: 288 10*3/uL (ref 150–400)
RBC: 4.64 MIL/uL (ref 3.87–5.11)
RDW: 12.4 % (ref 11.5–15.5)
WBC: 9 10*3/uL (ref 4.0–10.5)
nRBC: 0 % (ref 0.0–0.2)

## 2019-07-03 LAB — COMPREHENSIVE METABOLIC PANEL
ALT: 39 U/L (ref 0–44)
AST: 54 U/L — ABNORMAL HIGH (ref 15–41)
Albumin: 4.6 g/dL (ref 3.5–5.0)
Alkaline Phosphatase: 49 U/L (ref 38–126)
Anion gap: 8 (ref 5–15)
BUN: 12 mg/dL (ref 6–20)
CO2: 28 mmol/L (ref 22–32)
Calcium: 10.3 mg/dL (ref 8.9–10.3)
Chloride: 104 mmol/L (ref 98–111)
Creatinine, Ser: 1 mg/dL (ref 0.44–1.00)
GFR calc Af Amer: >60 mL/min (ref >60)
GFR calc non Af Amer: >60 mL/min (ref >60)
Glucose, Bld: 119 mg/dL — ABNORMAL HIGH (ref 70–99)
Potassium: 4.7 mmol/L (ref 3.5–5.1)
Sodium: 140 mmol/L (ref 135–145)
Total Bilirubin: 0.9 mg/dL (ref 0.3–1.2)
Total Protein: 7 g/dL (ref 6.5–8.1)

## 2019-07-03 LAB — LACTATE DEHYDROGENASE: LDH: 144 U/L (ref 98–192)

## 2019-07-04 LAB — IGG, IGA, IGM
IgA: 37 mg/dL — ABNORMAL LOW (ref 87–352)
IgG (Immunoglobin G), Serum: 564 mg/dL — ABNORMAL LOW (ref 586–1602)
IgM (Immunoglobulin M), Srm: 12 mg/dL — ABNORMAL LOW (ref 26–217)

## 2019-07-10 ENCOUNTER — Other Ambulatory Visit: Payer: Self-pay

## 2019-07-10 ENCOUNTER — Inpatient Hospital Stay (HOSPITAL_COMMUNITY): Payer: PPO | Attending: Hematology | Admitting: Hematology

## 2019-07-10 ENCOUNTER — Encounter (HOSPITAL_COMMUNITY): Payer: Self-pay | Admitting: Hematology

## 2019-07-10 VITALS — BP 112/68 | HR 80 | Temp 97.0°F | Resp 16 | Wt 230.0 lb

## 2019-07-10 DIAGNOSIS — Z87891 Personal history of nicotine dependence: Secondary | ICD-10-CM | POA: Diagnosis not present

## 2019-07-10 DIAGNOSIS — R7989 Other specified abnormal findings of blood chemistry: Secondary | ICD-10-CM | POA: Insufficient documentation

## 2019-07-10 DIAGNOSIS — K760 Fatty (change of) liver, not elsewhere classified: Secondary | ICD-10-CM | POA: Insufficient documentation

## 2019-07-10 DIAGNOSIS — D839 Common variable immunodeficiency, unspecified: Secondary | ICD-10-CM | POA: Insufficient documentation

## 2019-07-10 DIAGNOSIS — E8889 Other specified metabolic disorders: Secondary | ICD-10-CM | POA: Diagnosis not present

## 2019-07-10 NOTE — Patient Instructions (Addendum)
[facility] at [facility] Discharge Instructions  You were seen today by Dr. Katragadda. He went over your recent lab results. He will see you back in 6 months for labs and follow up.   Thank you for choosing [facility] at [facility] to provide your oncology and hematology care.  To afford each patient quality time with our provider, please arrive at least 15 minutes before your scheduled appointment time.   If you have a lab appointment with the Cancer Center please come in thru the  Main Entrance and check in at the main information desk  You need to re-schedule your appointment should you arrive 10 or more minutes late.  We strive to give you quality time with our providers, and arriving late affects you and other patients whose appointments are after yours.  Also, if you no show three or more times for appointments you may be dismissed from the clinic at the providers discretion.     Again, thank you for choosing [facility].  Our hope is that these requests will decrease the amount of time that you wait before being seen by our physicians.       _____________________________________________________________  Should you have questions after your visit to [facility], please contact our office at [phone] between the hours of 8:00 a.m. and 4:30 p.m.  Voicemails left after 4:00 p.m. will not be returned until the following business day.  For prescription refill requests, have your pharmacy contact our office and allow 72 hours.    Cancer Center Support Programs:   > Cancer Support Group  2nd Tuesday of the month 1pm-2pm, Journey Room    

## 2019-07-10 NOTE — Assessment & Plan Note (Signed)
1.  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  This was discontinued secondary to financial reasons. - She denies any fevers, night sweats or weight loss in the last 6 months. -She denies any recurrent infections in the last 1 year. - Physical examination today did not reveal any palpable adenopathy or splenomegaly. - We reviewed her blood work.  IgG level was 564.  Normal LDH and CBC. - We will monitor her in 79-month intervals.  2.  Elevated LFTs: - Ultrasound of the abdomen on 09/04/2018 showed diffuse hepatic steatosis. - Her AST today improved to 54.  ALT has normalized. -She was counseled to lose weight as treatment for hepatic steatosis with elevated liver enzymes.  3.  Health maintenance: -Mammogram on 01/16/2019 was BI-RADS Category 1.

## 2019-07-10 NOTE — Progress Notes (Signed)
[place], [place] [address]   CLINIC:  Medical Oncology/Hematology  PCP:  [name], MD [place] [address] [phone]   REASON FOR VISIT:  Follow-up for common variable immunodeficiency and elevated liver enzymes.    INTERVAL HISTORY:  Michelle Clements 53 y.o. female seen for follow-up of common variable immunodeficiency and elevated liver enzymes secondary to fatty liver.  She denies any fevers, night sweats or weight loss in the last 6 months.  Denies any nausea, vomiting or diarrhea or constipation.  No change in bowel habits.  Denies any bleeding per rectum or melena.  No infections in the last 1 year.  Denies any ER visits or hospitalizations.  Appetite is 100%.  Energy levels are 75%.  No pain reported.    REVIEW OF SYSTEMS:  Review of Systems  All other systems reviewed and are negative.    PAST MEDICAL/SURGICAL HISTORY:  Past Medical History:  Diagnosis Date  . Allergic rhinitis   . Anemia   . Arthritis    Lower back and hips  . Asthma   . Bipolar disorder ([place])   . 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[place])   . Constipation   . 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[place])   . Depression   . Essential hypertension, benign   . Fibromyalgia   . GERD (gastroesophageal reflux disease)   . H/O sleep apnea   . Hip pain, left   . History of palpitations    Negative Holter monitor  . History of pneumonia 1988  . Hyperlipidemia   . IBS (irritable bowel syndrome)   . Neck pain   . Poor short term memory   . PTSD (post-traumatic stress disorder)   . S/P Botox injection 11/2014    For migraine headaches  . Type 2 diabetes mellitus ([place])   . Urgency of urination    Past Surgical History:  Procedure Laterality Date  . CARPAL TUNNEL RELEASE Right 06/10/2013   Procedure: CARPAL TUNNEL RELEASE;  Surgeon: Faythe Ghee, MD;  Location: [facility];  Service: Neurosurgery;  Laterality: Right;  Right Carpal Tunnel  Release   . CHOLECYSTECTOMY    . DENTAL SURGERY  12/2014  . mole exc     x 3  . MUSCLE BIOPSY  2008   Right leg  . TUBAL LIGATION       SOCIAL HISTORY:  Social History   Socioeconomic History  . Marital status: Married    Spouse name: Not on file  . Number of children: 2  . Years of education: College  . Highest education level: Not on file  Occupational History  . 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 Financial resource strain: Not on file  . Food insecurity    Worry: Not on file    Inability: Not on file  . Transportation needs    Medical: Not on file    Non-medical: Not on file  Tobacco Use  . Smoking status: Former Smoker    Types: Cigarettes    Quit date: 11/04/2010    Years since quitting: 8.6  . Smokeless tobacco: Never Used  Substance and Sexual Activity  . Alcohol use: Yes    Alcohol/week: 0.0 standard drinks    Comment: one mixed drink per week  . Drug use: No  . Sexual activity: Yes    Birth control/protection: Surgical  Lifestyle  . Physical activity    Days per week: Not on file    Minutes per session: Not on file  . Stress: Not on file  Relationships  . Social Herbalist on phone: Not on file    Gets together: Not on file    Attends religious service: Not on file    Active member of club or organization: Not on file    Attends meetings of clubs or organizations: Not on file    Relationship status: Not on file  . Intimate partner violence    Fear of current or ex partner: Not on file    Emotionally abused: Not on file    Physically abused: Not on file    Forced sexual activity: Not on file  Other Topics Concern  . Not on file  Social History Narrative   Married   Lives with spouse and daughter   Right handed.   Caffeine use: 2 cups coffee in morning and 2 cups tea during the day     FAMILY HISTORY:  Family History  Problem Relation Age of  Onset  . Fibromyalgia Mother   . Diabetes type II Mother   . Depression Mother   . Alzheimer's disease Mother   . GER disease Mother   . Heart disease Mother   . Paranoid behavior Mother   . Dementia Mother   . Heart attack Father 31       MI x5  . Stroke Father        CVA x7  . Asthma Father   . Brain cancer Father   . Seizures Father   . Anxiety disorder Sister   . OCD Sister   . Sexual abuse Sister   . Physical abuse Sister   . Anxiety disorder Sister   . ADD / ADHD Daughter   . ADD / ADHD Daughter   . Alcohol abuse Neg Hx   . Drug abuse Neg Hx   . Schizophrenia Neg Hx     CURRENT MEDICATIONS:  Outpatient Encounter Medications as of 07/10/2019  Medication Sig Note  . albuterol (PROVENTIL HFA;VENTOLIN HFA) 108 (90 BASE) MCG/ACT inhaler Inhale 2 puffs into the lungs every 6 (six) hours as needed for wheezing or shortness of breath.   . benztropine (COGENTIN) 0.5 MG tablet Take 1 tablet (0.5 mg total) by mouth at bedtime.   . cholecalciferol (VITAMIN D3) 25 MCG (1000 UT) tablet Take 5,000 Units by mouth daily.   . clonazePAM (KLONOPIN) 0.5 MG tablet Take 1 tablet (0.5 mg total) by mouth 2 (two) times daily as needed for anxiety.   . fenofibrate 160 MG tablet    . fexofenadine (ALLEGRA) 30 MG tablet Take 30 mg by mouth daily.   Marland Kitchen FLUoxetine (PROZAC) 40 MG capsule Take 1 capsule (40 mg total) by mouth daily.   . fluticasone (FLONASE) 50 MCG/ACT nasal spray Place 2 sprays into both nostrils daily.   Marland Kitchen HYDROcodone-acetaminophen (NORCO) 7.5-325 MG tablet 1 tablet every 4 (four) hours as needed.  06/28/2016: Received from: External Pharmacy  . Lancets (ONETOUCH ULTRASOFT) lancets 1 each by Other route as needed.  04/25/2014: Received from: External Pharmacy  . Melatonin 5 MG TABS Take 5 mg by mouth at bedtime as needed.    . metFORMIN (GLUCOPHAGE) 1000 MG tablet    . Multiple Vitamin (MULTIVITAMIN) tablet Take 1 tablet by mouth daily. Reported on 02/10/2016   . naproxen sodium (ALEVE)  220 MG tablet Take 220 mg  by mouth 2 (two) times daily as needed.   . nitrofurantoin, macrocrystal-monohydrate, (MACROBID) 100 MG capsule Take 100 mg by mouth daily.   Marland Kitchen omeprazole (PRILOSEC) 20 MG capsule Take 20 mg by mouth daily.    . ONE TOUCH ULTRA TEST test strip  04/25/2014: Received from: External Pharmacy  . polyethylene glycol powder (GLYCOLAX/MIRALAX) powder Use 1 capful or 1/2 capful daily (Patient taking differently: Use 1 capful PRN)   . prazosin (MINIPRESS) 2 MG capsule Take 1 capsule (2 mg total) by mouth at bedtime.   . pregabalin (LYRICA) 150 MG capsule Take 150 mg by mouth 3 (three) times daily.   . risperiDONE (RISPERDAL) 1 MG tablet Take 1 tablet (1 mg total) by mouth at bedtime.   . rosuvastatin (CRESTOR) 40 MG tablet Take 40 mg by mouth daily.   Marland Kitchen tiZANidine (ZANAFLEX) 4 MG tablet Take 4 mg by mouth daily. 4mg  at bedtime and 2 mg during the day 03/12/2015: Received from: External Pharmacy Received Sig:   . [DISCONTINUED] buPROPion (WELLBUTRIN SR) 150 MG 12 hr tablet Take 1 tablet (150 mg total) by mouth 2 (two) times daily.   . [DISCONTINUED] pravastatin (PRAVACHOL) 40 MG tablet Take 40 mg by mouth daily.    No facility-administered encounter medications on file as of 07/10/2019.     ALLERGIES:  Allergies  Allergen Reactions  . Mold Extract  [Trichophyton] Shortness Of Breath    wheezing  . Other Anaphylaxis, Shortness Of Breath and Other (See Comments)     : Angioedema wheezing Wheezing wheezing Other reaction(s): Cough Wheezing Itching and rash Wheezing wheezing  . Sulfa Antibiotics Anaphylaxis     : Angioedema   . Sulfonamide Derivatives Anaphylaxis     : Angioedema  . Gluten Meal Itching  . Molds & Smuts   . Clonazepam Other (See Comments)    Confusion and memory loss Caused nconfusion  . Dust Mite Extract   . Tegretol [Carbamazepine] Rash     PHYSICAL EXAM:  ECOG Performance status: 1  Vitals:   07/10/19 1110  BP: 112/68  Pulse: 80  Resp: 16   Temp: (!) 97 F (36.1 C)  SpO2: 98%   Filed Weights   07/10/19 1110  Weight: 230 lb (104.3 kg)    Physical Exam Vitals signs reviewed.  Constitutional:      Appearance: Normal appearance.  Cardiovascular:     Rate and Rhythm: Normal rate and regular rhythm.     Heart sounds: Normal heart sounds.  Pulmonary:     Effort: Pulmonary effort is normal.     Breath sounds: Normal breath sounds.  Abdominal:     General: There is no distension.     Palpations: Abdomen is soft. There is no mass.  Musculoskeletal:        General: No swelling.  Lymphadenopathy:     Cervical: No cervical adenopathy.  Skin:    General: Skin is warm.  Neurological:     General: No focal deficit present.     Mental Status: She is alert and oriented to person, place, and time.  Psychiatric:        Mood and Affect: Mood normal.        Behavior: Behavior normal.      LABORATORY DATA:  I have reviewed the labs as listed.  CBC    Component Value Date/Time   WBC 9.0 07/03/2019 1230   RBC 4.64 07/03/2019 1230   HGB 14.7 07/03/2019 1230   HCT 42.8 07/03/2019 1230  PLT 288 07/03/2019 1230   MCV 92.2 07/03/2019 1230   MCH 31.7 07/03/2019 1230   MCHC 34.3 07/03/2019 1230   RDW 12.4 07/03/2019 1230   LYMPHSABS 2.2 07/03/2019 1230   MONOABS 0.5 07/03/2019 1230   EOSABS 0.3 07/03/2019 1230   BASOSABS 0.1 07/03/2019 1230   CMP Latest Ref Rng & Units 07/03/2019 12/26/2018 08/17/2018  Glucose 70 - 99 mg/dL 119(H) 198(H) 142(H)  BUN 6 - 20 mg/dL 12 9 13   Creatinine 0.44 - 1.00 mg/dL 1.00 0.89 0.93  Sodium 135 - 145 mmol/L 140 135 140  Potassium 3.5 - 5.1 mmol/L 4.7 4.7 4.3  Chloride 98 - 111 mmol/L 104 101 103  CO2 22 - 32 mmol/L 28 26 27   Calcium 8.9 - 10.3 mg/dL 10.3 9.8 9.9  Total Protein 6.5 - 8.1 g/dL 7.0 6.8 7.2  Total Bilirubin 0.3 - 1.2 mg/dL 0.9 0.6 0.8  Alkaline Phos 38 - 126 U/L 49 30(L) 29(L)  AST 15 - 41 U/L 54(H) 59(H) 78(H)  ALT 0 - 44 U/L 39 62(H) 72(H)       DIAGNOSTIC  IMAGING:  I have independently reviewed the scans and discussed with the patient.   I have reviewed Michelle Lick LPN's note and agree with the documentation.  I personally performed a face-to-face visit, made revisions and my assessment and plan is as follows.    ASSESSMENT & PLAN:   Common variable immunodeficiency 1.  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  This was discontinued secondary to financial reasons. - She denies any fevers, night sweats or weight loss in the last 6 months. -She denies any recurrent infections in the last 1 year. - Physical examination today did not reveal any palpable adenopathy or splenomegaly. - We reviewed her blood work.  IgG level was 564.  Normal LDH and CBC. - We will monitor her in 79-month intervals.  2.  Elevated LFTs: - Ultrasound of the abdomen on 09/04/2018 showed diffuse hepatic steatosis. - Her AST today improved to 54.  ALT has normalized. -She was counseled to lose weight as treatment for hepatic steatosis with elevated liver enzymes.  3.  Health maintenance: -Mammogram on 01/16/2019 was BI-RADS Category 1.  Total time spent is 25 minutes with more than 50% of the time spent face-to-face discussing diagnosis, surveillance plan, counseling and coordination of care.    Orders placed this encounter:  Orders Placed This Encounter  Procedures  . CBC with Differential/Platelet  . Comprehensive metabolic panel  . Lactate dehydrogenase  . IgG, IgA, IgM      Derek Jack, MD [place] [phone]

## 2019-07-17 DIAGNOSIS — J4520 Mild intermittent asthma, uncomplicated: Secondary | ICD-10-CM | POA: Diagnosis not present

## 2019-07-17 DIAGNOSIS — G4730 Sleep apnea, unspecified: Secondary | ICD-10-CM | POA: Diagnosis not present

## 2019-07-17 DIAGNOSIS — F3181 Bipolar II disorder: Secondary | ICD-10-CM | POA: Diagnosis not present

## 2019-07-17 DIAGNOSIS — E559 Vitamin D deficiency, unspecified: Secondary | ICD-10-CM | POA: Diagnosis not present

## 2019-07-17 DIAGNOSIS — E1169 Type 2 diabetes mellitus with other specified complication: Secondary | ICD-10-CM | POA: Diagnosis not present

## 2019-07-17 DIAGNOSIS — E782 Mixed hyperlipidemia: Secondary | ICD-10-CM | POA: Diagnosis not present

## 2019-07-17 DIAGNOSIS — G3184 Mild cognitive impairment, so stated: Secondary | ICD-10-CM | POA: Diagnosis not present

## 2019-07-17 DIAGNOSIS — K760 Fatty (change of) liver, not elsewhere classified: Secondary | ICD-10-CM | POA: Diagnosis not present

## 2019-07-17 DIAGNOSIS — D839 Common variable immunodeficiency, unspecified: Secondary | ICD-10-CM | POA: Diagnosis not present

## 2019-07-17 DIAGNOSIS — M797 Fibromyalgia: Secondary | ICD-10-CM | POA: Diagnosis not present

## 2019-07-17 DIAGNOSIS — F514 Sleep terrors [night terrors]: Secondary | ICD-10-CM | POA: Diagnosis not present

## 2019-07-18 DIAGNOSIS — D839 Common variable immunodeficiency, unspecified: Secondary | ICD-10-CM | POA: Diagnosis not present

## 2019-07-18 DIAGNOSIS — E782 Mixed hyperlipidemia: Secondary | ICD-10-CM | POA: Diagnosis not present

## 2019-07-18 DIAGNOSIS — E559 Vitamin D deficiency, unspecified: Secondary | ICD-10-CM | POA: Diagnosis not present

## 2019-07-18 DIAGNOSIS — F3181 Bipolar II disorder: Secondary | ICD-10-CM | POA: Diagnosis not present

## 2019-07-18 DIAGNOSIS — F514 Sleep terrors [night terrors]: Secondary | ICD-10-CM | POA: Diagnosis not present

## 2019-07-18 DIAGNOSIS — J4520 Mild intermittent asthma, uncomplicated: Secondary | ICD-10-CM | POA: Diagnosis not present

## 2019-07-18 DIAGNOSIS — K760 Fatty (change of) liver, not elsewhere classified: Secondary | ICD-10-CM | POA: Diagnosis not present

## 2019-07-18 DIAGNOSIS — E1169 Type 2 diabetes mellitus with other specified complication: Secondary | ICD-10-CM | POA: Diagnosis not present

## 2019-07-18 DIAGNOSIS — M797 Fibromyalgia: Secondary | ICD-10-CM | POA: Diagnosis not present

## 2019-07-18 DIAGNOSIS — G3184 Mild cognitive impairment, so stated: Secondary | ICD-10-CM | POA: Diagnosis not present

## 2019-07-18 DIAGNOSIS — G4730 Sleep apnea, unspecified: Secondary | ICD-10-CM | POA: Diagnosis not present

## 2019-08-14 DIAGNOSIS — I10 Essential (primary) hypertension: Secondary | ICD-10-CM | POA: Diagnosis not present

## 2019-08-14 DIAGNOSIS — E1165 Type 2 diabetes mellitus with hyperglycemia: Secondary | ICD-10-CM | POA: Diagnosis not present

## 2019-08-14 DIAGNOSIS — E1169 Type 2 diabetes mellitus with other specified complication: Secondary | ICD-10-CM | POA: Diagnosis not present

## 2019-08-14 DIAGNOSIS — E039 Hypothyroidism, unspecified: Secondary | ICD-10-CM | POA: Diagnosis not present

## 2019-08-14 DIAGNOSIS — D509 Iron deficiency anemia, unspecified: Secondary | ICD-10-CM | POA: Diagnosis not present

## 2019-08-14 DIAGNOSIS — E119 Type 2 diabetes mellitus without complications: Secondary | ICD-10-CM | POA: Diagnosis not present

## 2019-08-14 DIAGNOSIS — F339 Major depressive disorder, recurrent, unspecified: Secondary | ICD-10-CM | POA: Diagnosis not present

## 2019-08-14 DIAGNOSIS — F319 Bipolar disorder, unspecified: Secondary | ICD-10-CM | POA: Diagnosis not present

## 2019-08-14 DIAGNOSIS — E782 Mixed hyperlipidemia: Secondary | ICD-10-CM | POA: Diagnosis not present

## 2019-08-14 DIAGNOSIS — K219 Gastro-esophageal reflux disease without esophagitis: Secondary | ICD-10-CM | POA: Diagnosis not present

## 2019-08-16 DIAGNOSIS — L299 Pruritus, unspecified: Secondary | ICD-10-CM | POA: Diagnosis not present

## 2019-08-16 DIAGNOSIS — R21 Rash and other nonspecific skin eruption: Secondary | ICD-10-CM | POA: Diagnosis not present

## 2019-08-29 ENCOUNTER — Other Ambulatory Visit: Payer: Self-pay

## 2019-08-29 ENCOUNTER — Ambulatory Visit: Payer: PPO | Admitting: Neurology

## 2019-08-29 ENCOUNTER — Encounter: Payer: Self-pay | Admitting: Neurology

## 2019-08-29 VITALS — BP 130/78 | HR 76 | Temp 97.8°F | Ht 71.0 in | Wt 231.0 lb

## 2019-08-29 DIAGNOSIS — R4189 Other symptoms and signs involving cognitive functions and awareness: Secondary | ICD-10-CM | POA: Diagnosis not present

## 2019-08-29 NOTE — Progress Notes (Signed)
[id number]    Provider:  Dr Jaynee Eagles Requesting Provider: [name], MD Primary Care Provider:  [name], MD  CC:  Memory loss  HPI:  Michelle Clements is a 53 y.o. female here as requested by [name], MD for poor short term memory.  Past medical history type 2 diabetes, PTSD, poor short-term memory, neck pain, IBS, hyperlipidemia, left hip pain, sleep apnea, fibromyalgia, hypertension, depression, common variable immunodeficiency, compulsive behavior disorder, constipation. Started a year ago, her daughter and her husband would tell her things and they would do things together, she watched a series on netflix a few months and doesn't remember it, she forgets appointments, she forgets things her husband tells her, they are noticing it. Daughter is getting irritated with her. She has been recently wearing her cpap 4-5 hours a night. She started only a month ago. Dr. Nevada Crane wrote her for a mask 5 years ago, she is still tired during the day, she does not work she is disabled and has a lot of pain, nothing dangerous like leaving stove on or accidents, she worries a lot about her daughter and that is hard, she has bipolar, depression, anxiety, PTSD, compulsive behavior disorder. No other focal neurologic deficits, associated symptoms, inciting events or modifiable factors.  Reviewed notes, labs and imaging from outside physicians, which showed:  Personally reviewed MRI of the brain November 2014 which was normal.  CBC July 2020 normal.  CMP with elevated glucose and elevated AST otherwise normal.  Review of Systems: Patient complains of symptoms per HPI as well as the following symptoms: memory loss, sleep apnea, depression, anxiety, PTSD, stress. Pertinent negatives and positives per HPI. All others negative.   Social History   Socioeconomic History  . Marital status: Married    Spouse name: Not on file  . Number of children: 2  . Years of education: College  . Highest  education level: Not on file  Occupational History  . 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 Financial resource strain: Not on file  . Food insecurity    Worry: Not on file    Inability: Not on file  . Transportation needs    Medical: Not on file    Non-medical: Not on file  Tobacco Use  . Smoking status: Former Smoker    Types: Cigarettes    Quit date: 11/04/2010    Years since quitting: 8.8  . Smokeless tobacco: Never Used  Substance and Sexual Activity  . Alcohol use: Yes    Alcohol/week: 0.0 standard drinks    Comment: one mixed drink per week  . Drug use: No  . Sexual activity: Yes    Birth control/protection: Surgical  Lifestyle  . Physical activity    Days per week: Not on file    Minutes per session: Not on file  . Stress: Not on file  Relationships  . Social Herbalist on phone: Not on file    Gets together: Not on file    Attends religious service: Not on file    Active member of club or organization: Not on file    Attends meetings of clubs or organizations: Not on file    Relationship status: Not on file  . Intimate partner violence    Fear of current or ex partner: Not on file    Emotionally abused: Not on file  Physically abused: Not on file    Forced sexual activity: Not on file  Other Topics Concern  . Not on file  Social History Narrative   Married   Lives with spouse and daughter   Right handed.   Caffeine use: 2 cups coffee in morning and 2 cups tea during the day     Family History  Problem Relation Age of Onset  . Fibromyalgia Mother   . Diabetes type II Mother   . Depression Mother   . Alzheimer's disease Mother   . GER disease Mother   . Heart disease Mother   . Paranoid behavior Mother   . Dementia Mother   . Heart attack Father 31       MI x5  . Stroke Father        CVA x7  . Asthma Father   . Brain cancer Father   . Seizures Father    . Anxiety disorder Sister   . OCD Sister   . Sexual abuse Sister   . Physical abuse Sister   . Anxiety disorder Sister   . ADD / ADHD Daughter   . ADD / ADHD Daughter   . Alcohol abuse Neg Hx   . Drug abuse Neg Hx   . Schizophrenia Neg Hx     Past Medical History:  Diagnosis Date  . Allergic rhinitis   . Anemia   . Arthritis    Lower back and hips  . Asthma   . Bipolar disorder ([place])   . 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 Constipation   . 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[place])   . Depression   . Essential hypertension, benign   . Fibromyalgia   . GERD (gastroesophageal reflux disease)   . H/O sleep apnea   . Hip pain, left   . History of palpitations    Negative Holter monitor  . History of pneumonia 1988  . Hyperlipidemia   . IBS (irritable bowel syndrome)   . Neck pain   . Poor short term memory   . PTSD (post-traumatic stress disorder)   . S/P Botox injection 11/2014    For migraine headaches  . Type 2 diabetes mellitus ([place])   . Urgency of urination     Patient Active Problem List   Diagnosis Date Noted  . Migraine 10/28/2014  . Neck pain of over 3 months duration 10/28/2014  . 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[place]) 07/29/2014  . Headache(784.0) 11/06/2013  . Cervicalgia 11/06/2013  . Acute confusional state 11/06/2013  . History of stroke in prior 3 months 10/15/2013  . Neuropathy of upper extremity 04/23/2013  . Unspecified vitamin D deficiency 02/13/2009  . Morbid obesity ([place]) 02/13/2009  . BIPOLAR AFFECTIVE DISORDER 11/19/2007  . Essential hypertension, benign 11/16/2007  . Mixed hyperlipidemia 10/03/2007  . Hx of tobacco use, presenting hazards to health 10/03/2007  . DEPRESSION 10/03/2007  . IBS 10/03/2007  . FIBROMYALGIA 10/03/2007  . Palpitations 10/03/2007    Past Surgical History:  Procedure Laterality Date  . CARPAL TUNNEL RELEASE Right 06/10/2013   Procedure: CARPAL TUNNEL RELEASE;  Surgeon: Faythe Ghee, MD;   Location: [facility];  Service: Neurosurgery;  Laterality: Right;  Right Carpal Tunnel Release   . CHOLECYSTECTOMY    . DENTAL SURGERY  12/2014  . mole exc     x 3  . MUSCLE BIOPSY  2008   Right leg  . TUBAL LIGATION      Current Outpatient Medications  Medication Sig Dispense  Refill  . albuterol (PROVENTIL HFA;VENTOLIN HFA) 108 (90 BASE) MCG/ACT inhaler Inhale 2 puffs into the lungs every 6 (six) hours as needed for wheezing or shortness of breath.    . benztropine (COGENTIN) 0.5 MG tablet Take 1 tablet (0.5 mg total) by mouth at bedtime. 90 tablet 2  . clonazePAM (KLONOPIN) 0.5 MG tablet Take 1 tablet (0.5 mg total) by mouth 2 (two) times daily as needed for anxiety. 180 tablet 1  . FLUoxetine (PROZAC) 40 MG capsule Take 1 capsule (40 mg total) by mouth daily. 90 capsule 2  . fluticasone (FLONASE) 50 MCG/ACT nasal spray Place 2 sprays into both nostrils daily.    Marland Kitchen HYDROcodone-acetaminophen (NORCO) 7.5-325 MG tablet 1 tablet every 4 (four) hours as needed.     . Lancets (ONETOUCH ULTRASOFT) lancets 1 each by Other route as needed.     . Levocetirizine Dihydrochloride (XYZAL PO) Take 1 tablet by mouth daily.    . Melatonin 5 MG TABS Take 5 mg by mouth at bedtime as needed.     . metFORMIN (GLUCOPHAGE) 1000 MG tablet Take 1,000 mg by mouth 2 (two) times daily with a meal.     . Multiple Vitamin (MULTIVITAMIN) tablet Take 1 tablet by mouth daily. Reported on 02/10/2016    . naproxen sodium (ALEVE) 220 MG tablet Take 220 mg by mouth 2 (two) times daily as needed.    . nitrofurantoin, macrocrystal-monohydrate, (MACROBID) 100 MG capsule Take 100 mg by mouth daily.    Marland Kitchen omeprazole (PRILOSEC) 20 MG capsule Take 20 mg by mouth daily.     . ONE TOUCH ULTRA TEST test strip     . polyethylene glycol powder (GLYCOLAX/MIRALAX) powder Use 1 capful or 1/2 capful daily (Patient taking differently: Use 1 capful PRN) 500 g 2  . prazosin (MINIPRESS) 2 MG capsule Take 1 capsule (2 mg total) by mouth at  bedtime. 90 capsule 2  . pregabalin (LYRICA) 150 MG capsule Take 150 mg by mouth 3 (three) times daily.    . risperiDONE (RISPERDAL) 1 MG tablet Take 1 tablet (1 mg total) by mouth at bedtime. 90 tablet 2  . rosuvastatin (CRESTOR) 40 MG tablet Take 40 mg by mouth daily.    Marland Kitchen tiZANidine (ZANAFLEX) 4 MG tablet Take 4 mg by mouth daily. 4mg  at bedtime and 2 mg during the day     No current facility-administered medications for this visit.     Allergies as of 08/29/2019 - Review Complete 08/29/2019  Allergen Reaction Noted  . Mold extract  [trichophyton] Shortness Of Breath 09/17/2014  . Other Anaphylaxis, Shortness Of Breath, and Other (See Comments) 09/17/2014  . Sulfa antibiotics Anaphylaxis 09/17/2014  . Sulfonamide derivatives Anaphylaxis 10/03/2007  . Gluten meal Itching 09/26/2014  . Molds & smuts  09/26/2014  . Clonazepam Other (See Comments) 10/01/2013  . Dust mite extract  09/26/2014  . Tegretol [carbamazepine] Rash 06/03/2015    Vitals: BP 130/78 (BP Location: Right Arm, Patient Position: Sitting)   Pulse 76   Temp 97.8 F (36.6 C) Comment: taken by check-in staff  Ht 5\' 11"  (1.803 m)   Wt 231 lb (104.8 kg)   BMI 32.22 kg/m  Last Weight:  Wt Readings from Last 1 Encounters:  08/29/19 231 lb (104.8 kg)   Last Height:   Ht Readings from Last 1 Encounters:  08/29/19 5\' 11"  (1.803 m)     Physical exam: Exam: Gen: NAD, conversant, well nourised, obese, well groomed  CV: RRR, no MRG. No Carotid Bruits. No peripheral edema, warm, nontender Eyes: Conjunctivae clear without exudates or hemorrhage  Neuro: Detailed Neurologic Exam  Speech:    Speech is normal; fluent and spontaneous with normal comprehension.  Cognition:    The patient is oriented to person, place, and time;     recent and remote memory intact;     language fluent;     normal attention, concentration,     fund of knowledge Cranial Nerves:    The pupils are equal, round, and  reactive to light. The fundi are normal and spontaneous venous pulsations are present. Visual fields are full to finger confrontation. Extraocular movements are intact. Trigeminal sensation is intact and the muscles of mastication are normal. The face is symmetric(left lid ptosis chronic). The palate elevates in the midline. Hearing intact. Voice is normal. Shoulder shrug is normal. The tongue has normal motion without fasciculations.   Coordination:    Normal finger to nose and heel to shin. Normal rapid alternating movements.   Gait:    Heel-toe and tandem gait are normal.   Motor Observation:    No asymmetry, no atrophy, and no involuntary movements noted. Tone:    Normal muscle tone.    Posture:    Posture is normal. normal erect    Strength:    Strength is V/V in the upper and lower limbs.      Sensation: intact to LT     Reflex Exam:  DTR's:    Deep tendon reflexes in the upper and lower extremities are normal bilaterally.   Toes:    The toes are downgoing bilaterally.   Clonus:    Clonus is absent.    Assessment/Plan:  53 y.o. female here as requested by [name], MD for poor short term memory.  Past medical history type 2 diabetes, PTSD, poor short-term memory, neck pain, IBS, hyperlipidemia, left hip pain, sleep apnea, fibromyalgia, hypertension, depression, common variable immunodeficiency, compulsive behavior disorder, constipation.  -  I don' t suspect any neurodegenerative disease in patient, likely multifactorial including psychiatric disorders(bipolar, depression, anxiety, PTSD, compulsive behavior disorder.), chronic pain, untreated sleep apnea, lifestyle, normal cognitive aging, ast elevated but says not using alcohol.   - Dr. [place] Crane writes for her cpap, recommend checking report to ensure cpap setting is good, no significant residual apneas and no leaking.  - MRI normal, recommend repeat but she declines at this time and I don;t think it is absolutely  necessary.   Orders Placed This Encounter  Procedures  . B12 and Folate Panel  . Vitamin B1  . Methylmalonic acid, serum  . TSH    Cc: [name], MD,    Sarina Ill, MD  [facility] [address] [place], [place] [address]  Phone [phone] Fax [phone] *-)

## 2019-09-05 LAB — B12 AND FOLATE PANEL
Folate: >20 ng/mL (ref >3.0)
Vitamin B-12: 397 pg/mL (ref 232–1245)

## 2019-09-05 LAB — METHYLMALONIC ACID, SERUM: Methylmalonic Acid: 389 nmol/L — ABNORMAL HIGH (ref 0–378)

## 2019-09-05 LAB — VITAMIN B1: Thiamine: 175.2 nmol/L (ref 66.5–200.0)

## 2019-09-05 LAB — TSH: TSH: 2.77 u[IU]/mL (ref 0.450–4.500)

## 2019-09-11 ENCOUNTER — Telehealth: Payer: Self-pay | Admitting: *Deleted

## 2019-09-11 NOTE — Telephone Encounter (Signed)
-----   Message from Melvenia Beam, MD sent at 09/06/2019  2:29 PM EDT ----- She has B12 deficiency. Even though B12 is low-normal, methylmalonic acid is still elevated which means she need more B12. I would recommend getting B12 supplements over the counter 1000-2000 daily and getting B12 reched in 3 month

## 2019-09-11 NOTE — Telephone Encounter (Signed)
LVM for pt to call office about results. 

## 2019-09-16 DIAGNOSIS — E1169 Type 2 diabetes mellitus with other specified complication: Secondary | ICD-10-CM | POA: Diagnosis not present

## 2019-09-16 DIAGNOSIS — D509 Iron deficiency anemia, unspecified: Secondary | ICD-10-CM | POA: Diagnosis not present

## 2019-09-16 DIAGNOSIS — F319 Bipolar disorder, unspecified: Secondary | ICD-10-CM | POA: Diagnosis not present

## 2019-09-16 DIAGNOSIS — E782 Mixed hyperlipidemia: Secondary | ICD-10-CM | POA: Diagnosis not present

## 2019-09-16 DIAGNOSIS — E119 Type 2 diabetes mellitus without complications: Secondary | ICD-10-CM | POA: Diagnosis not present

## 2019-09-16 DIAGNOSIS — E1165 Type 2 diabetes mellitus with hyperglycemia: Secondary | ICD-10-CM | POA: Diagnosis not present

## 2019-09-16 DIAGNOSIS — K219 Gastro-esophageal reflux disease without esophagitis: Secondary | ICD-10-CM | POA: Diagnosis not present

## 2019-09-16 DIAGNOSIS — E039 Hypothyroidism, unspecified: Secondary | ICD-10-CM | POA: Diagnosis not present

## 2019-09-16 DIAGNOSIS — F339 Major depressive disorder, recurrent, unspecified: Secondary | ICD-10-CM | POA: Diagnosis not present

## 2019-09-16 DIAGNOSIS — I10 Essential (primary) hypertension: Secondary | ICD-10-CM | POA: Diagnosis not present

## 2019-09-17 NOTE — Telephone Encounter (Signed)
Called, LVM for pt again to call office. 

## 2019-09-18 NOTE — Telephone Encounter (Signed)
I have spoken with the patient and she verbalized understanding of the information below.  She will start the recommended B12 supplement and is agreeable to be rechecked in three months.

## 2019-10-01 ENCOUNTER — Ambulatory Visit (INDEPENDENT_AMBULATORY_CARE_PROVIDER_SITE_OTHER): Payer: PPO | Admitting: Psychiatry

## 2019-10-01 ENCOUNTER — Other Ambulatory Visit: Payer: Self-pay

## 2019-10-01 ENCOUNTER — Encounter (HOSPITAL_COMMUNITY): Payer: Self-pay | Admitting: Psychiatry

## 2019-10-01 DIAGNOSIS — F332 Major depressive disorder, recurrent severe without psychotic features: Secondary | ICD-10-CM

## 2019-10-01 MED ORDER — BENZTROPINE MESYLATE 1 MG PO TABS: 1.0000 mg | ORAL_TABLET | Freq: Every day | ORAL | 2 refills | Status: DC

## 2019-10-01 MED ORDER — RISPERIDONE 1 MG PO TABS: 1.0000 mg | ORAL_TABLET | Freq: Every day | ORAL | 2 refills | Status: DC

## 2019-10-01 MED ORDER — PRAZOSIN HCL 2 MG PO CAPS: 2.0000 mg | ORAL_CAPSULE | Freq: Every day | ORAL | 2 refills | Status: DC

## 2019-10-01 MED ORDER — FLUOXETINE HCL 40 MG PO CAPS: 40.0000 mg | ORAL_CAPSULE | Freq: Every day | ORAL | 2 refills | Status: DC
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
## 2019-10-07 DIAGNOSIS — R509 Fever, unspecified: Secondary | ICD-10-CM | POA: Diagnosis not present

## 2019-10-07 DIAGNOSIS — Z20828 Contact with and (suspected) exposure to other viral communicable diseases: Secondary | ICD-10-CM | POA: Diagnosis not present

## 2019-10-11 DIAGNOSIS — D509 Iron deficiency anemia, unspecified: Secondary | ICD-10-CM | POA: Diagnosis not present

## 2019-10-11 DIAGNOSIS — F319 Bipolar disorder, unspecified: Secondary | ICD-10-CM | POA: Diagnosis not present

## 2019-10-11 DIAGNOSIS — E119 Type 2 diabetes mellitus without complications: Secondary | ICD-10-CM | POA: Diagnosis not present

## 2019-10-11 DIAGNOSIS — E782 Mixed hyperlipidemia: Secondary | ICD-10-CM | POA: Diagnosis not present

## 2019-10-11 DIAGNOSIS — I10 Essential (primary) hypertension: Secondary | ICD-10-CM | POA: Diagnosis not present

## 2019-10-11 DIAGNOSIS — F339 Major depressive disorder, recurrent, unspecified: Secondary | ICD-10-CM | POA: Diagnosis not present

## 2019-10-11 DIAGNOSIS — E1165 Type 2 diabetes mellitus with hyperglycemia: Secondary | ICD-10-CM | POA: Diagnosis not present

## 2019-10-11 DIAGNOSIS — E1169 Type 2 diabetes mellitus with other specified complication: Secondary | ICD-10-CM | POA: Diagnosis not present

## 2019-10-11 DIAGNOSIS — E039 Hypothyroidism, unspecified: Secondary | ICD-10-CM | POA: Diagnosis not present

## 2019-10-11 DIAGNOSIS — K219 Gastro-esophageal reflux disease without esophagitis: Secondary | ICD-10-CM | POA: Diagnosis not present

## 2019-10-24 DIAGNOSIS — E782 Mixed hyperlipidemia: Secondary | ICD-10-CM | POA: Diagnosis not present

## 2019-10-24 DIAGNOSIS — E039 Hypothyroidism, unspecified: Secondary | ICD-10-CM | POA: Diagnosis not present

## 2019-10-24 DIAGNOSIS — D509 Iron deficiency anemia, unspecified: Secondary | ICD-10-CM | POA: Diagnosis not present

## 2019-10-24 DIAGNOSIS — E119 Type 2 diabetes mellitus without complications: Secondary | ICD-10-CM | POA: Diagnosis not present

## 2019-10-24 DIAGNOSIS — I10 Essential (primary) hypertension: Secondary | ICD-10-CM | POA: Diagnosis not present

## 2019-10-24 DIAGNOSIS — Z23 Encounter for immunization: Secondary | ICD-10-CM | POA: Diagnosis not present

## 2019-10-24 DIAGNOSIS — E559 Vitamin D deficiency, unspecified: Secondary | ICD-10-CM | POA: Diagnosis not present

## 2019-10-24 DIAGNOSIS — G3184 Mild cognitive impairment, so stated: Secondary | ICD-10-CM | POA: Diagnosis not present

## 2019-10-24 DIAGNOSIS — E1169 Type 2 diabetes mellitus with other specified complication: Secondary | ICD-10-CM | POA: Diagnosis not present

## 2019-10-28 DIAGNOSIS — E1169 Type 2 diabetes mellitus with other specified complication: Secondary | ICD-10-CM | POA: Diagnosis not present

## 2019-10-28 DIAGNOSIS — G4730 Sleep apnea, unspecified: Secondary | ICD-10-CM | POA: Diagnosis not present

## 2019-10-28 DIAGNOSIS — G3184 Mild cognitive impairment, so stated: Secondary | ICD-10-CM | POA: Diagnosis not present

## 2019-10-28 DIAGNOSIS — F3181 Bipolar II disorder: Secondary | ICD-10-CM | POA: Diagnosis not present

## 2019-10-28 DIAGNOSIS — E559 Vitamin D deficiency, unspecified: Secondary | ICD-10-CM | POA: Diagnosis not present

## 2019-10-28 DIAGNOSIS — J4520 Mild intermittent asthma, uncomplicated: Secondary | ICD-10-CM | POA: Diagnosis not present

## 2019-10-28 DIAGNOSIS — Z712 Person consulting for explanation of examination or test findings: Secondary | ICD-10-CM | POA: Diagnosis not present

## 2019-10-28 DIAGNOSIS — D839 Common variable immunodeficiency, unspecified: Secondary | ICD-10-CM | POA: Diagnosis not present

## 2019-10-28 DIAGNOSIS — M797 Fibromyalgia: Secondary | ICD-10-CM | POA: Diagnosis not present

## 2019-10-28 DIAGNOSIS — F514 Sleep terrors [night terrors]: Secondary | ICD-10-CM | POA: Diagnosis not present

## 2019-10-28 DIAGNOSIS — K760 Fatty (change of) liver, not elsewhere classified: Secondary | ICD-10-CM | POA: Diagnosis not present

## 2019-11-13 DIAGNOSIS — J4520 Mild intermittent asthma, uncomplicated: Secondary | ICD-10-CM | POA: Diagnosis not present

## 2019-11-13 DIAGNOSIS — K760 Fatty (change of) liver, not elsewhere classified: Secondary | ICD-10-CM | POA: Diagnosis not present

## 2019-11-13 DIAGNOSIS — Z712 Person consulting for explanation of examination or test findings: Secondary | ICD-10-CM | POA: Diagnosis not present

## 2019-11-13 DIAGNOSIS — G3184 Mild cognitive impairment, so stated: Secondary | ICD-10-CM | POA: Diagnosis not present

## 2019-11-13 DIAGNOSIS — M797 Fibromyalgia: Secondary | ICD-10-CM | POA: Diagnosis not present

## 2019-11-13 DIAGNOSIS — G4730 Sleep apnea, unspecified: Secondary | ICD-10-CM | POA: Diagnosis not present

## 2019-11-13 DIAGNOSIS — E559 Vitamin D deficiency, unspecified: Secondary | ICD-10-CM | POA: Diagnosis not present

## 2019-11-13 DIAGNOSIS — F514 Sleep terrors [night terrors]: Secondary | ICD-10-CM | POA: Diagnosis not present

## 2019-11-13 DIAGNOSIS — D839 Common variable immunodeficiency, unspecified: Secondary | ICD-10-CM | POA: Diagnosis not present

## 2019-11-13 DIAGNOSIS — F3181 Bipolar II disorder: Secondary | ICD-10-CM | POA: Diagnosis not present

## 2019-11-13 DIAGNOSIS — E1169 Type 2 diabetes mellitus with other specified complication: Secondary | ICD-10-CM | POA: Diagnosis not present

## 2019-11-19 ENCOUNTER — Encounter (HOSPITAL_COMMUNITY): Payer: Self-pay | Admitting: Psychiatry

## 2019-11-19 ENCOUNTER — Other Ambulatory Visit: Payer: Self-pay

## 2019-11-19 ENCOUNTER — Ambulatory Visit (INDEPENDENT_AMBULATORY_CARE_PROVIDER_SITE_OTHER): Payer: PPO | Admitting: Psychiatry

## 2019-11-19 DIAGNOSIS — F515 Nightmare disorder: Secondary | ICD-10-CM

## 2019-11-19 DIAGNOSIS — F419 Anxiety disorder, unspecified: Secondary | ICD-10-CM | POA: Diagnosis not present

## 2019-11-19 DIAGNOSIS — F4310 Post-traumatic stress disorder, unspecified: Secondary | ICD-10-CM | POA: Diagnosis not present

## 2019-11-19 DIAGNOSIS — F333 Major depressive disorder, recurrent, severe with psychotic symptoms: Secondary | ICD-10-CM | POA: Diagnosis not present

## 2019-11-19 DIAGNOSIS — F332 Major depressive disorder, recurrent severe without psychotic features: Secondary | ICD-10-CM

## 2019-11-19 MED ORDER — PRAZOSIN HCL 2 MG PO CAPS: 2.0000 mg | ORAL_CAPSULE | Freq: Every day | ORAL | 2 refills | Status: DC

## 2019-11-19 MED ORDER — FLUOXETINE HCL 40 MG PO CAPS: 40.0000 mg | ORAL_CAPSULE | Freq: Every day | ORAL | 2 refills | Status: DC
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
## 2019-12-09 DIAGNOSIS — F3181 Bipolar II disorder: Secondary | ICD-10-CM | POA: Diagnosis not present

## 2019-12-09 DIAGNOSIS — M797 Fibromyalgia: Secondary | ICD-10-CM | POA: Diagnosis not present

## 2019-12-09 DIAGNOSIS — E1169 Type 2 diabetes mellitus with other specified complication: Secondary | ICD-10-CM | POA: Diagnosis not present

## 2019-12-09 DIAGNOSIS — J4520 Mild intermittent asthma, uncomplicated: Secondary | ICD-10-CM | POA: Diagnosis not present

## 2019-12-09 DIAGNOSIS — D839 Common variable immunodeficiency, unspecified: Secondary | ICD-10-CM | POA: Diagnosis not present

## 2019-12-09 DIAGNOSIS — E559 Vitamin D deficiency, unspecified: Secondary | ICD-10-CM | POA: Diagnosis not present

## 2019-12-09 DIAGNOSIS — E782 Mixed hyperlipidemia: Secondary | ICD-10-CM | POA: Diagnosis not present

## 2019-12-09 DIAGNOSIS — K760 Fatty (change of) liver, not elsewhere classified: Secondary | ICD-10-CM | POA: Diagnosis not present

## 2019-12-09 DIAGNOSIS — G4730 Sleep apnea, unspecified: Secondary | ICD-10-CM | POA: Diagnosis not present

## 2019-12-09 DIAGNOSIS — G3184 Mild cognitive impairment, so stated: Secondary | ICD-10-CM | POA: Diagnosis not present

## 2019-12-09 DIAGNOSIS — F331 Major depressive disorder, recurrent, moderate: Secondary | ICD-10-CM | POA: Diagnosis not present

## 2019-12-17 ENCOUNTER — Other Ambulatory Visit (HOSPITAL_COMMUNITY): Payer: Self-pay | Admitting: Internal Medicine

## 2019-12-17 DIAGNOSIS — Z1231 Encounter for screening mammogram for malignant neoplasm of breast: Secondary | ICD-10-CM

## 2019-12-27 DIAGNOSIS — M5416 Radiculopathy, lumbar region: Secondary | ICD-10-CM | POA: Diagnosis not present

## 2019-12-27 DIAGNOSIS — M545 Low back pain: Secondary | ICD-10-CM | POA: Diagnosis not present

## 2020-01-01 ENCOUNTER — Ambulatory Visit (INDEPENDENT_AMBULATORY_CARE_PROVIDER_SITE_OTHER): Payer: PPO | Admitting: Psychiatry

## 2020-01-01 ENCOUNTER — Encounter (HOSPITAL_COMMUNITY): Payer: Self-pay | Admitting: Psychiatry

## 2020-01-01 ENCOUNTER — Other Ambulatory Visit: Payer: Self-pay

## 2020-01-01 DIAGNOSIS — F332 Major depressive disorder, recurrent severe without psychotic features: Secondary | ICD-10-CM | POA: Diagnosis not present

## 2020-01-01 MED ORDER — PRAZOSIN HCL 2 MG PO CAPS: 2.0000 mg | ORAL_CAPSULE | Freq: Every day | ORAL | 2 refills | Status: DC

## 2020-01-01 MED ORDER — BENZTROPINE MESYLATE 1 MG PO TABS: 1.0000 mg | ORAL_TABLET | Freq: Every day | ORAL | 2 refills | Status: DC

## 2020-01-01 MED ORDER — RISPERIDONE 1 MG PO TABS: 1.0000 mg | ORAL_TABLET | Freq: Every day | ORAL | 2 refills | Status: DC

## 2020-01-01 MED ORDER — FLUOXETINE HCL 40 MG PO CAPS: 40.0000 mg | ORAL_CAPSULE | Freq: Every day | ORAL | 2 refills | Status: DC
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
## 2020-01-09 ENCOUNTER — Inpatient Hospital Stay (HOSPITAL_COMMUNITY): Payer: Medicare Other | Attending: Hematology

## 2020-01-13 DIAGNOSIS — Z20822 Contact with and (suspected) exposure to covid-19: Secondary | ICD-10-CM | POA: Diagnosis not present

## 2020-01-13 DIAGNOSIS — J069 Acute upper respiratory infection, unspecified: Secondary | ICD-10-CM | POA: Diagnosis not present

## 2020-01-16 ENCOUNTER — Ambulatory Visit (HOSPITAL_COMMUNITY): Payer: PPO | Admitting: Nurse Practitioner

## 2020-01-20 ENCOUNTER — Ambulatory Visit (HOSPITAL_COMMUNITY): Payer: PPO

## 2020-01-20 ENCOUNTER — Ambulatory Visit (HOSPITAL_COMMUNITY)
Admission: RE | Admit: 2020-01-20 | Discharge: 2020-01-20 | Disposition: A | Discharge status: Home / Self Care | Payer: Medicare Other | Source: Ambulatory Visit | Attending: Pulmonary Disease | Admitting: Pulmonary Disease

## 2020-01-20 ENCOUNTER — Other Ambulatory Visit: Payer: Self-pay | Admitting: Nurse Practitioner

## 2020-01-20 DIAGNOSIS — Z23 Encounter for immunization: Secondary | ICD-10-CM | POA: Insufficient documentation

## 2020-01-20 DIAGNOSIS — E119 Type 2 diabetes mellitus without complications: Secondary | ICD-10-CM | POA: Insufficient documentation

## 2020-01-20 DIAGNOSIS — U071 COVID-19: Secondary | ICD-10-CM | POA: Diagnosis not present

## 2020-01-20 MED ORDER — SODIUM CHLORIDE 0.9 % IV SOLN: INTRAVENOUS | Status: DC | PRN

## 2020-01-20 MED ORDER — EPINEPHRINE 0.3 MG/0.3ML IJ SOAJ: 0.3000 mg | Freq: Once | INTRAMUSCULAR | Status: DC | PRN

## 2020-01-20 MED ORDER — DIPHENHYDRAMINE HCL 50 MG/ML IJ SOLN: 50.0000 mg | Freq: Once | INTRAMUSCULAR | Status: DC | PRN

## 2020-01-20 MED ORDER — ALBUTEROL SULFATE HFA 108 (90 BASE) MCG/ACT IN AERS: 2.0000 | INHALATION_SPRAY | Freq: Once | RESPIRATORY_TRACT | Status: DC | PRN

## 2020-01-20 MED ORDER — METHYLPREDNISOLONE SODIUM SUCC 125 MG IJ SOLR: 125.0000 mg | Freq: Once | INTRAMUSCULAR | Status: DC | PRN

## 2020-01-20 MED ORDER — SODIUM CHLORIDE 0.9 % IV SOLN
700.0000 mg | Freq: Once | INTRAVENOUS | Status: AC
  Administered 2020-01-20: 700 mg via INTRAVENOUS
  Filled 2020-01-20: qty 20

## 2020-01-20 MED ORDER — FAMOTIDINE IN NACL 20-0.9 MG/50ML-% IV SOLN: 20.0000 mg | Freq: Once | INTRAVENOUS | Status: DC | PRN

## 2020-01-20 NOTE — Progress Notes (Signed)
  Diagnosis: COVID-19  Physician: Dr. Wright  Procedure: [facility] Med: bamlanivimab infusion - Provided patient with bamlanimivab fact sheet for patients, parents and caregivers prior to infusion.  Complications: No immediate complications noted.  Discharge: Discharged home   Michelle Clements 01/20/2020  

## 2020-01-20 NOTE — Discharge Instructions (Signed)

## 2020-01-20 NOTE — Progress Notes (Signed)
  I connected by phone with Michelle Clements on 01/20/2020 at 3:09 PM to discuss the potential use of an new treatment for mild to moderate COVID-19 viral infection in non-hospitalized patients.  This patient is a 54 y.o. female that meets the FDA criteria for Emergency Use Authorization of bamlanivimab or casirivimab\imdevimab.  Has a (+) direct SARS-CoV-2 viral test result  Has mild or moderate COVID-19   Is ? 54 years of age and weighs ? 40 kg  Is NOT hospitalized due to COVID-19  Is NOT requiring oxygen therapy or requiring an increase in baseline oxygen flow rate due to COVID-19  Is within 10 days of symptom onset  Has at least one of the high risk factor(s) for progression to severe COVID-19 and/or hospitalization as defined in EUA.  Specific high risk criteria : Diabetes   I have spoken and communicated the following to the patient or parent/caregiver:  1. FDA has authorized the emergency use of bamlanivimab and casirivimab\imdevimab for the treatment of mild to moderate COVID-19 in adults and pediatric patients with positive results of direct SARS-CoV-2 viral testing who are 68 years of age and older weighing at least 40 kg, and who are at high risk for progressing to severe COVID-19 and/or hospitalization.  2. The significant known and potential risks and benefits of bamlanivimab and casirivimab\imdevimab, and the extent to which such potential risks and benefits are unknown.  3. Information on available alternative treatments and the risks and benefits of those alternatives, including clinical trials.  4. Patients treated with bamlanivimab and casirivimab\imdevimab should continue to self-isolate and use infection control measures (e.g., wear mask, isolate, social distance, avoid sharing personal items, clean and disinfect "high touch" surfaces, and frequent handwashing) according to CDC guidelines.   5. The patient or parent/caregiver has the option to accept or refuse  bamlanivimab or casirivimab\imdevimab .  After reviewing this information with the patient, The patient agreed to proceed with receiving the bamlanimivab infusion and will be provided a copy of the Fact sheet prior to receiving the infusion.[name] 01/20/2020 3:09 PM

## 2020-01-21 ENCOUNTER — Encounter (HOSPITAL_COMMUNITY): Payer: Self-pay

## 2020-01-22 DIAGNOSIS — E782 Mixed hyperlipidemia: Secondary | ICD-10-CM | POA: Diagnosis not present

## 2020-01-22 DIAGNOSIS — E039 Hypothyroidism, unspecified: Secondary | ICD-10-CM | POA: Diagnosis not present

## 2020-01-22 DIAGNOSIS — K219 Gastro-esophageal reflux disease without esophagitis: Secondary | ICD-10-CM | POA: Diagnosis not present

## 2020-01-22 DIAGNOSIS — F339 Major depressive disorder, recurrent, unspecified: Secondary | ICD-10-CM | POA: Diagnosis not present

## 2020-01-22 DIAGNOSIS — E119 Type 2 diabetes mellitus without complications: Secondary | ICD-10-CM | POA: Diagnosis not present

## 2020-01-22 DIAGNOSIS — I10 Essential (primary) hypertension: Secondary | ICD-10-CM | POA: Diagnosis not present

## 2020-01-22 DIAGNOSIS — F319 Bipolar disorder, unspecified: Secondary | ICD-10-CM | POA: Diagnosis not present

## 2020-01-22 DIAGNOSIS — E1169 Type 2 diabetes mellitus with other specified complication: Secondary | ICD-10-CM | POA: Diagnosis not present

## 2020-01-22 DIAGNOSIS — E1165 Type 2 diabetes mellitus with hyperglycemia: Secondary | ICD-10-CM | POA: Diagnosis not present

## 2020-01-22 DIAGNOSIS — D509 Iron deficiency anemia, unspecified: Secondary | ICD-10-CM | POA: Diagnosis not present

## 2020-01-24 DIAGNOSIS — U071 COVID-19: Secondary | ICD-10-CM | POA: Diagnosis not present

## 2020-02-04 DIAGNOSIS — F339 Major depressive disorder, recurrent, unspecified: Secondary | ICD-10-CM | POA: Diagnosis not present

## 2020-02-04 DIAGNOSIS — G589 Mononeuropathy, unspecified: Secondary | ICD-10-CM | POA: Diagnosis not present

## 2020-02-04 DIAGNOSIS — D509 Iron deficiency anemia, unspecified: Secondary | ICD-10-CM | POA: Diagnosis not present

## 2020-02-04 DIAGNOSIS — R945 Abnormal results of liver function studies: Secondary | ICD-10-CM | POA: Diagnosis not present

## 2020-02-04 DIAGNOSIS — M4800 Spinal stenosis, site unspecified: Secondary | ICD-10-CM | POA: Diagnosis not present

## 2020-02-04 DIAGNOSIS — E119 Type 2 diabetes mellitus without complications: Secondary | ICD-10-CM | POA: Diagnosis not present

## 2020-02-04 DIAGNOSIS — I10 Essential (primary) hypertension: Secondary | ICD-10-CM | POA: Diagnosis not present

## 2020-02-04 DIAGNOSIS — R5382 Chronic fatigue, unspecified: Secondary | ICD-10-CM | POA: Diagnosis not present

## 2020-02-04 DIAGNOSIS — E7289 Other specified disorders of amino-acid metabolism: Secondary | ICD-10-CM | POA: Diagnosis not present

## 2020-02-04 DIAGNOSIS — G4733 Obstructive sleep apnea (adult) (pediatric): Secondary | ICD-10-CM | POA: Diagnosis not present

## 2020-02-04 DIAGNOSIS — J45909 Unspecified asthma, uncomplicated: Secondary | ICD-10-CM | POA: Diagnosis not present

## 2020-02-04 DIAGNOSIS — E6609 Other obesity due to excess calories: Secondary | ICD-10-CM | POA: Diagnosis not present

## 2020-02-04 DIAGNOSIS — D839 Common variable immunodeficiency, unspecified: Secondary | ICD-10-CM | POA: Diagnosis not present

## 2020-02-04 DIAGNOSIS — G894 Chronic pain syndrome: Secondary | ICD-10-CM | POA: Diagnosis not present

## 2020-02-06 DIAGNOSIS — M797 Fibromyalgia: Secondary | ICD-10-CM | POA: Diagnosis not present

## 2020-02-06 DIAGNOSIS — E559 Vitamin D deficiency, unspecified: Secondary | ICD-10-CM | POA: Diagnosis not present

## 2020-02-06 DIAGNOSIS — D839 Common variable immunodeficiency, unspecified: Secondary | ICD-10-CM | POA: Diagnosis not present

## 2020-02-06 DIAGNOSIS — F514 Sleep terrors [night terrors]: Secondary | ICD-10-CM | POA: Diagnosis not present

## 2020-02-06 DIAGNOSIS — F3181 Bipolar II disorder: Secondary | ICD-10-CM | POA: Diagnosis not present

## 2020-02-06 DIAGNOSIS — J4520 Mild intermittent asthma, uncomplicated: Secondary | ICD-10-CM | POA: Diagnosis not present

## 2020-02-06 DIAGNOSIS — K760 Fatty (change of) liver, not elsewhere classified: Secondary | ICD-10-CM | POA: Diagnosis not present

## 2020-02-06 DIAGNOSIS — E1169 Type 2 diabetes mellitus with other specified complication: Secondary | ICD-10-CM | POA: Diagnosis not present

## 2020-02-06 DIAGNOSIS — Z712 Person consulting for explanation of examination or test findings: Secondary | ICD-10-CM | POA: Diagnosis not present

## 2020-02-06 DIAGNOSIS — G3184 Mild cognitive impairment, so stated: Secondary | ICD-10-CM | POA: Diagnosis not present

## 2020-02-06 DIAGNOSIS — G4730 Sleep apnea, unspecified: Secondary | ICD-10-CM | POA: Diagnosis not present

## 2020-02-19 DIAGNOSIS — D839 Common variable immunodeficiency, unspecified: Secondary | ICD-10-CM | POA: Diagnosis not present

## 2020-02-19 DIAGNOSIS — Z712 Person consulting for explanation of examination or test findings: Secondary | ICD-10-CM | POA: Diagnosis not present

## 2020-02-19 DIAGNOSIS — F3181 Bipolar II disorder: Secondary | ICD-10-CM | POA: Diagnosis not present

## 2020-02-19 DIAGNOSIS — G3184 Mild cognitive impairment, so stated: Secondary | ICD-10-CM | POA: Diagnosis not present

## 2020-02-19 DIAGNOSIS — J4520 Mild intermittent asthma, uncomplicated: Secondary | ICD-10-CM | POA: Diagnosis not present

## 2020-02-19 DIAGNOSIS — M797 Fibromyalgia: Secondary | ICD-10-CM | POA: Diagnosis not present

## 2020-02-19 DIAGNOSIS — E782 Mixed hyperlipidemia: Secondary | ICD-10-CM | POA: Diagnosis not present

## 2020-02-19 DIAGNOSIS — G4730 Sleep apnea, unspecified: Secondary | ICD-10-CM | POA: Diagnosis not present

## 2020-02-19 DIAGNOSIS — E559 Vitamin D deficiency, unspecified: Secondary | ICD-10-CM | POA: Diagnosis not present

## 2020-02-19 DIAGNOSIS — E1169 Type 2 diabetes mellitus with other specified complication: Secondary | ICD-10-CM | POA: Diagnosis not present

## 2020-02-19 DIAGNOSIS — K760 Fatty (change of) liver, not elsewhere classified: Secondary | ICD-10-CM | POA: Diagnosis not present

## 2020-03-04 DIAGNOSIS — M25461 Effusion, right knee: Secondary | ICD-10-CM | POA: Diagnosis not present

## 2020-03-04 DIAGNOSIS — M25561 Pain in right knee: Secondary | ICD-10-CM | POA: Diagnosis not present

## 2020-03-11 DIAGNOSIS — M542 Cervicalgia: Secondary | ICD-10-CM | POA: Diagnosis not present

## 2020-03-11 DIAGNOSIS — R519 Headache, unspecified: Secondary | ICD-10-CM | POA: Diagnosis not present

## 2020-03-24 ENCOUNTER — Other Ambulatory Visit (HOSPITAL_COMMUNITY): Payer: Self-pay | Admitting: Psychiatry

## 2020-03-24 ENCOUNTER — Telehealth (HOSPITAL_COMMUNITY): Payer: Self-pay | Admitting: *Deleted

## 2020-03-24 DIAGNOSIS — D839 Common variable immunodeficiency, unspecified: Secondary | ICD-10-CM | POA: Diagnosis not present

## 2020-03-24 DIAGNOSIS — M797 Fibromyalgia: Secondary | ICD-10-CM | POA: Diagnosis not present

## 2020-03-24 DIAGNOSIS — E559 Vitamin D deficiency, unspecified: Secondary | ICD-10-CM | POA: Diagnosis not present

## 2020-03-24 DIAGNOSIS — E1169 Type 2 diabetes mellitus with other specified complication: Secondary | ICD-10-CM | POA: Diagnosis not present

## 2020-03-24 DIAGNOSIS — F3181 Bipolar II disorder: Secondary | ICD-10-CM | POA: Diagnosis not present

## 2020-03-24 DIAGNOSIS — K760 Fatty (change of) liver, not elsewhere classified: Secondary | ICD-10-CM | POA: Diagnosis not present

## 2020-03-24 DIAGNOSIS — Z712 Person consulting for explanation of examination or test findings: Secondary | ICD-10-CM | POA: Diagnosis not present

## 2020-03-24 DIAGNOSIS — J4520 Mild intermittent asthma, uncomplicated: Secondary | ICD-10-CM | POA: Diagnosis not present

## 2020-03-24 DIAGNOSIS — G4730 Sleep apnea, unspecified: Secondary | ICD-10-CM | POA: Diagnosis not present

## 2020-03-24 DIAGNOSIS — E782 Mixed hyperlipidemia: Secondary | ICD-10-CM | POA: Diagnosis not present

## 2020-03-24 DIAGNOSIS — F331 Major depressive disorder, recurrent, moderate: Secondary | ICD-10-CM | POA: Diagnosis not present

## 2020-03-24 MED ORDER — RISPERIDONE 1 MG PO TABS: 1.0000 mg | ORAL_TABLET | Freq: Every day | ORAL | 0 refills | Status: DC

## 2020-03-24 MED ORDER — FLUOXETINE HCL 40 MG PO CAPS: 40.0000 mg | ORAL_CAPSULE | Freq: Every day | ORAL | 0 refills | Status: DC

## 2020-03-24 NOTE — Telephone Encounter (Signed)
Ordered

## 2020-03-24 NOTE — Telephone Encounter (Signed)
Dr Modesta Messing patient of Dr Barn[facility] CALLED REFILL REQUEST ::  FLUoxetine (PROZAC) 40 MG capsule  &&  risperiDONE (RISPERDAL)  1 MG tablet -- Next  Appt 04-01-2020

## 2020-04-01 ENCOUNTER — Telehealth (HOSPITAL_COMMUNITY): Payer: PPO | Admitting: Psychiatry

## 2020-04-09 DIAGNOSIS — E782 Mixed hyperlipidemia: Secondary | ICD-10-CM | POA: Diagnosis not present

## 2020-04-09 DIAGNOSIS — G4730 Sleep apnea, unspecified: Secondary | ICD-10-CM | POA: Diagnosis not present

## 2020-04-09 DIAGNOSIS — F331 Major depressive disorder, recurrent, moderate: Secondary | ICD-10-CM | POA: Diagnosis not present

## 2020-04-09 DIAGNOSIS — D839 Common variable immunodeficiency, unspecified: Secondary | ICD-10-CM | POA: Diagnosis not present

## 2020-04-09 DIAGNOSIS — F3181 Bipolar II disorder: Secondary | ICD-10-CM | POA: Diagnosis not present

## 2020-04-09 DIAGNOSIS — K760 Fatty (change of) liver, not elsewhere classified: Secondary | ICD-10-CM | POA: Diagnosis not present

## 2020-04-09 DIAGNOSIS — J4520 Mild intermittent asthma, uncomplicated: Secondary | ICD-10-CM | POA: Diagnosis not present

## 2020-04-09 DIAGNOSIS — E559 Vitamin D deficiency, unspecified: Secondary | ICD-10-CM | POA: Diagnosis not present

## 2020-04-09 DIAGNOSIS — M797 Fibromyalgia: Secondary | ICD-10-CM | POA: Diagnosis not present

## 2020-04-09 DIAGNOSIS — Z712 Person consulting for explanation of examination or test findings: Secondary | ICD-10-CM | POA: Diagnosis not present

## 2020-04-09 DIAGNOSIS — E1169 Type 2 diabetes mellitus with other specified complication: Secondary | ICD-10-CM | POA: Diagnosis not present

## 2020-04-14 ENCOUNTER — Telehealth (INDEPENDENT_AMBULATORY_CARE_PROVIDER_SITE_OTHER): Payer: PPO | Admitting: Psychiatry

## 2020-04-14 ENCOUNTER — Other Ambulatory Visit: Payer: Self-pay

## 2020-04-14 ENCOUNTER — Encounter (HOSPITAL_COMMUNITY): Payer: Self-pay | Admitting: Psychiatry

## 2020-04-14 DIAGNOSIS — F332 Major depressive disorder, recurrent severe without psychotic features: Secondary | ICD-10-CM

## 2020-04-14 MED ORDER — RISPERIDONE 1 MG PO TABS: 1.0000 mg | ORAL_TABLET | Freq: Every day | ORAL | 2 refills | Status: DC

## 2020-04-14 MED ORDER — CLONAZEPAM 0.5 MG PO TABS: 0.5000 mg | ORAL_TABLET | Freq: Two times a day (BID) | ORAL | 2 refills | Status: DC | PRN

## 2020-04-14 MED ORDER — FLUOXETINE HCL 40 MG PO CAPS: 40.0000 mg | ORAL_CAPSULE | Freq: Every day | ORAL | 0 refills | Status: DC
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
## 2020-04-15 DIAGNOSIS — S338XXA Sprain of other parts of lumbar spine and pelvis, initial encounter: Secondary | ICD-10-CM | POA: Diagnosis not present

## 2020-04-15 DIAGNOSIS — S134XXA Sprain of ligaments of cervical spine, initial encounter: Secondary | ICD-10-CM | POA: Diagnosis not present

## 2020-04-15 DIAGNOSIS — S233XXA Sprain of ligaments of thoracic spine, initial encounter: Secondary | ICD-10-CM | POA: Diagnosis not present

## 2020-04-16 DIAGNOSIS — S338XXA Sprain of other parts of lumbar spine and pelvis, initial encounter: Secondary | ICD-10-CM | POA: Diagnosis not present

## 2020-04-16 DIAGNOSIS — S134XXA Sprain of ligaments of cervical spine, initial encounter: Secondary | ICD-10-CM | POA: Diagnosis not present

## 2020-04-16 DIAGNOSIS — S233XXA Sprain of ligaments of thoracic spine, initial encounter: Secondary | ICD-10-CM | POA: Diagnosis not present

## 2020-04-20 DIAGNOSIS — S233XXA Sprain of ligaments of thoracic spine, initial encounter: Secondary | ICD-10-CM | POA: Diagnosis not present

## 2020-04-20 DIAGNOSIS — S134XXA Sprain of ligaments of cervical spine, initial encounter: Secondary | ICD-10-CM | POA: Diagnosis not present

## 2020-04-20 DIAGNOSIS — S338XXA Sprain of other parts of lumbar spine and pelvis, initial encounter: Secondary | ICD-10-CM | POA: Diagnosis not present

## 2020-04-22 DIAGNOSIS — S338XXA Sprain of other parts of lumbar spine and pelvis, initial encounter: Secondary | ICD-10-CM | POA: Diagnosis not present

## 2020-04-22 DIAGNOSIS — S233XXA Sprain of ligaments of thoracic spine, initial encounter: Secondary | ICD-10-CM | POA: Diagnosis not present

## 2020-04-22 DIAGNOSIS — S134XXA Sprain of ligaments of cervical spine, initial encounter: Secondary | ICD-10-CM | POA: Diagnosis not present

## 2020-04-24 DIAGNOSIS — S134XXA Sprain of ligaments of cervical spine, initial encounter: Secondary | ICD-10-CM | POA: Diagnosis not present

## 2020-04-24 DIAGNOSIS — S338XXA Sprain of other parts of lumbar spine and pelvis, initial encounter: Secondary | ICD-10-CM | POA: Diagnosis not present

## 2020-04-24 DIAGNOSIS — S233XXA Sprain of ligaments of thoracic spine, initial encounter: Secondary | ICD-10-CM | POA: Diagnosis not present

## 2020-04-27 DIAGNOSIS — S134XXA Sprain of ligaments of cervical spine, initial encounter: Secondary | ICD-10-CM | POA: Diagnosis not present

## 2020-04-27 DIAGNOSIS — S338XXA Sprain of other parts of lumbar spine and pelvis, initial encounter: Secondary | ICD-10-CM | POA: Diagnosis not present

## 2020-04-27 DIAGNOSIS — S233XXA Sprain of ligaments of thoracic spine, initial encounter: Secondary | ICD-10-CM | POA: Diagnosis not present

## 2020-04-29 DIAGNOSIS — M533 Sacrococcygeal disorders, not elsewhere classified: Secondary | ICD-10-CM | POA: Diagnosis not present

## 2020-04-30 DIAGNOSIS — S134XXA Sprain of ligaments of cervical spine, initial encounter: Secondary | ICD-10-CM | POA: Diagnosis not present

## 2020-04-30 DIAGNOSIS — S233XXA Sprain of ligaments of thoracic spine, initial encounter: Secondary | ICD-10-CM | POA: Diagnosis not present

## 2020-04-30 DIAGNOSIS — S338XXA Sprain of other parts of lumbar spine and pelvis, initial encounter: Secondary | ICD-10-CM | POA: Diagnosis not present

## 2020-05-05 DIAGNOSIS — S233XXA Sprain of ligaments of thoracic spine, initial encounter: Secondary | ICD-10-CM | POA: Diagnosis not present

## 2020-05-05 DIAGNOSIS — S338XXA Sprain of other parts of lumbar spine and pelvis, initial encounter: Secondary | ICD-10-CM | POA: Diagnosis not present

## 2020-05-05 DIAGNOSIS — S134XXA Sprain of ligaments of cervical spine, initial encounter: Secondary | ICD-10-CM | POA: Diagnosis not present

## 2020-05-11 DIAGNOSIS — S134XXA Sprain of ligaments of cervical spine, initial encounter: Secondary | ICD-10-CM | POA: Diagnosis not present

## 2020-05-11 DIAGNOSIS — S338XXA Sprain of other parts of lumbar spine and pelvis, initial encounter: Secondary | ICD-10-CM | POA: Diagnosis not present

## 2020-05-11 DIAGNOSIS — S233XXA Sprain of ligaments of thoracic spine, initial encounter: Secondary | ICD-10-CM | POA: Diagnosis not present

## 2020-05-14 DIAGNOSIS — D839 Common variable immunodeficiency, unspecified: Secondary | ICD-10-CM | POA: Diagnosis not present

## 2020-05-14 DIAGNOSIS — E039 Hypothyroidism, unspecified: Secondary | ICD-10-CM | POA: Diagnosis not present

## 2020-05-14 DIAGNOSIS — E559 Vitamin D deficiency, unspecified: Secondary | ICD-10-CM | POA: Diagnosis not present

## 2020-05-14 DIAGNOSIS — E119 Type 2 diabetes mellitus without complications: Secondary | ICD-10-CM | POA: Diagnosis not present

## 2020-05-14 DIAGNOSIS — S233XXA Sprain of ligaments of thoracic spine, initial encounter: Secondary | ICD-10-CM | POA: Diagnosis not present

## 2020-05-14 DIAGNOSIS — E6609 Other obesity due to excess calories: Secondary | ICD-10-CM | POA: Diagnosis not present

## 2020-05-14 DIAGNOSIS — D509 Iron deficiency anemia, unspecified: Secondary | ICD-10-CM | POA: Diagnosis not present

## 2020-05-14 DIAGNOSIS — E782 Mixed hyperlipidemia: Secondary | ICD-10-CM | POA: Diagnosis not present

## 2020-05-14 DIAGNOSIS — E1169 Type 2 diabetes mellitus with other specified complication: Secondary | ICD-10-CM | POA: Diagnosis not present

## 2020-05-14 DIAGNOSIS — S134XXA Sprain of ligaments of cervical spine, initial encounter: Secondary | ICD-10-CM | POA: Diagnosis not present

## 2020-05-14 DIAGNOSIS — S338XXA Sprain of other parts of lumbar spine and pelvis, initial encounter: Secondary | ICD-10-CM | POA: Diagnosis not present

## 2020-05-14 DIAGNOSIS — E1165 Type 2 diabetes mellitus with hyperglycemia: Secondary | ICD-10-CM | POA: Diagnosis not present

## 2020-05-14 DIAGNOSIS — B379 Candidiasis, unspecified: Secondary | ICD-10-CM | POA: Diagnosis not present

## 2020-05-18 DIAGNOSIS — K760 Fatty (change of) liver, not elsewhere classified: Secondary | ICD-10-CM | POA: Diagnosis not present

## 2020-05-18 DIAGNOSIS — E1169 Type 2 diabetes mellitus with other specified complication: Secondary | ICD-10-CM | POA: Diagnosis not present

## 2020-05-18 DIAGNOSIS — G4730 Sleep apnea, unspecified: Secondary | ICD-10-CM | POA: Diagnosis not present

## 2020-05-18 DIAGNOSIS — Z712 Person consulting for explanation of examination or test findings: Secondary | ICD-10-CM | POA: Diagnosis not present

## 2020-05-18 DIAGNOSIS — E559 Vitamin D deficiency, unspecified: Secondary | ICD-10-CM | POA: Diagnosis not present

## 2020-05-18 DIAGNOSIS — F331 Major depressive disorder, recurrent, moderate: Secondary | ICD-10-CM | POA: Diagnosis not present

## 2020-05-18 DIAGNOSIS — M797 Fibromyalgia: Secondary | ICD-10-CM | POA: Diagnosis not present

## 2020-05-18 DIAGNOSIS — D839 Common variable immunodeficiency, unspecified: Secondary | ICD-10-CM | POA: Diagnosis not present

## 2020-05-18 DIAGNOSIS — J4520 Mild intermittent asthma, uncomplicated: Secondary | ICD-10-CM | POA: Diagnosis not present

## 2020-05-18 DIAGNOSIS — F3181 Bipolar II disorder: Secondary | ICD-10-CM | POA: Diagnosis not present

## 2020-05-18 DIAGNOSIS — E782 Mixed hyperlipidemia: Secondary | ICD-10-CM | POA: Diagnosis not present

## 2020-05-19 DIAGNOSIS — Z0001 Encounter for general adult medical examination with abnormal findings: Secondary | ICD-10-CM | POA: Diagnosis not present

## 2020-05-19 DIAGNOSIS — G3184 Mild cognitive impairment, so stated: Secondary | ICD-10-CM | POA: Diagnosis not present

## 2020-05-19 DIAGNOSIS — E559 Vitamin D deficiency, unspecified: Secondary | ICD-10-CM | POA: Diagnosis not present

## 2020-05-19 DIAGNOSIS — J4520 Mild intermittent asthma, uncomplicated: Secondary | ICD-10-CM | POA: Diagnosis not present

## 2020-05-19 DIAGNOSIS — D839 Common variable immunodeficiency, unspecified: Secondary | ICD-10-CM | POA: Diagnosis not present

## 2020-05-19 DIAGNOSIS — M545 Low back pain: Secondary | ICD-10-CM | POA: Diagnosis not present

## 2020-05-19 DIAGNOSIS — M797 Fibromyalgia: Secondary | ICD-10-CM | POA: Diagnosis not present

## 2020-05-19 DIAGNOSIS — K760 Fatty (change of) liver, not elsewhere classified: Secondary | ICD-10-CM | POA: Diagnosis not present

## 2020-05-19 DIAGNOSIS — G4730 Sleep apnea, unspecified: Secondary | ICD-10-CM | POA: Diagnosis not present

## 2020-05-19 DIAGNOSIS — E1169 Type 2 diabetes mellitus with other specified complication: Secondary | ICD-10-CM | POA: Diagnosis not present

## 2020-05-19 DIAGNOSIS — F3181 Bipolar II disorder: Secondary | ICD-10-CM | POA: Diagnosis not present

## 2020-06-18 DIAGNOSIS — F331 Major depressive disorder, recurrent, moderate: Secondary | ICD-10-CM | POA: Diagnosis not present

## 2020-06-18 DIAGNOSIS — E782 Mixed hyperlipidemia: Secondary | ICD-10-CM | POA: Diagnosis not present

## 2020-06-18 DIAGNOSIS — M545 Low back pain: Secondary | ICD-10-CM | POA: Diagnosis not present

## 2020-06-18 DIAGNOSIS — F3181 Bipolar II disorder: Secondary | ICD-10-CM | POA: Diagnosis not present

## 2020-06-18 DIAGNOSIS — E1169 Type 2 diabetes mellitus with other specified complication: Secondary | ICD-10-CM | POA: Diagnosis not present

## 2020-06-18 DIAGNOSIS — Z0001 Encounter for general adult medical examination with abnormal findings: Secondary | ICD-10-CM | POA: Diagnosis not present

## 2020-06-18 DIAGNOSIS — E559 Vitamin D deficiency, unspecified: Secondary | ICD-10-CM | POA: Diagnosis not present

## 2020-06-18 DIAGNOSIS — D839 Common variable immunodeficiency, unspecified: Secondary | ICD-10-CM | POA: Diagnosis not present

## 2020-06-18 DIAGNOSIS — G4730 Sleep apnea, unspecified: Secondary | ICD-10-CM | POA: Diagnosis not present

## 2020-06-18 DIAGNOSIS — M797 Fibromyalgia: Secondary | ICD-10-CM | POA: Diagnosis not present

## 2020-06-18 DIAGNOSIS — K760 Fatty (change of) liver, not elsewhere classified: Secondary | ICD-10-CM | POA: Diagnosis not present

## 2020-07-02 ENCOUNTER — Other Ambulatory Visit (HOSPITAL_BASED_OUTPATIENT_CLINIC_OR_DEPARTMENT_OTHER): Payer: Self-pay

## 2020-07-02 DIAGNOSIS — G4733 Obstructive sleep apnea (adult) (pediatric): Secondary | ICD-10-CM

## 2020-07-07 ENCOUNTER — Other Ambulatory Visit: Payer: Self-pay

## 2020-07-07 ENCOUNTER — Ambulatory Visit: Payer: PPO | Attending: Internal Medicine | Admitting: Neurology

## 2020-07-07 DIAGNOSIS — G4730 Sleep apnea, unspecified: Secondary | ICD-10-CM | POA: Insufficient documentation

## 2020-07-07 DIAGNOSIS — Z7984 Long term (current) use of oral hypoglycemic drugs: Secondary | ICD-10-CM | POA: Diagnosis not present

## 2020-07-07 DIAGNOSIS — Z79899 Other long term (current) drug therapy: Secondary | ICD-10-CM | POA: Diagnosis not present

## 2020-07-07 DIAGNOSIS — G4733 Obstructive sleep apnea (adult) (pediatric): Secondary | ICD-10-CM

## 2020-07-08 ENCOUNTER — Telehealth (INDEPENDENT_AMBULATORY_CARE_PROVIDER_SITE_OTHER): Payer: PPO | Admitting: Psychiatry

## 2020-07-08 ENCOUNTER — Encounter (HOSPITAL_COMMUNITY): Payer: Self-pay | Admitting: Psychiatry

## 2020-07-08 DIAGNOSIS — F332 Major depressive disorder, recurrent severe without psychotic features: Secondary | ICD-10-CM

## 2020-07-08 MED ORDER — FLUOXETINE HCL 40 MG PO CAPS: 40.0000 mg | ORAL_CAPSULE | Freq: Every day | ORAL | 2 refills | Status: DC

## 2020-07-08 MED ORDER — CLONAZEPAM 0.5 MG PO TABS: 0.5000 mg | ORAL_TABLET | Freq: Two times a day (BID) | ORAL | 2 refills | Status: DC | PRN

## 2020-07-08 MED ORDER — RISPERIDONE 1 MG PO TABS: 1.0000 mg | ORAL_TABLET | Freq: Every day | ORAL | 2 refills | Status: DC
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
## 2020-07-14 DIAGNOSIS — F331 Major depressive disorder, recurrent, moderate: Secondary | ICD-10-CM | POA: Diagnosis not present

## 2020-07-14 DIAGNOSIS — M545 Low back pain: Secondary | ICD-10-CM | POA: Diagnosis not present

## 2020-07-14 DIAGNOSIS — M797 Fibromyalgia: Secondary | ICD-10-CM | POA: Diagnosis not present

## 2020-07-14 DIAGNOSIS — E782 Mixed hyperlipidemia: Secondary | ICD-10-CM | POA: Diagnosis not present

## 2020-07-14 DIAGNOSIS — D839 Common variable immunodeficiency, unspecified: Secondary | ICD-10-CM | POA: Diagnosis not present

## 2020-07-14 DIAGNOSIS — E1169 Type 2 diabetes mellitus with other specified complication: Secondary | ICD-10-CM | POA: Diagnosis not present

## 2020-07-14 DIAGNOSIS — F3181 Bipolar II disorder: Secondary | ICD-10-CM | POA: Diagnosis not present

## 2020-07-14 DIAGNOSIS — G4730 Sleep apnea, unspecified: Secondary | ICD-10-CM | POA: Diagnosis not present

## 2020-07-14 DIAGNOSIS — Z0001 Encounter for general adult medical examination with abnormal findings: Secondary | ICD-10-CM | POA: Diagnosis not present

## 2020-07-14 DIAGNOSIS — K760 Fatty (change of) liver, not elsewhere classified: Secondary | ICD-10-CM | POA: Diagnosis not present

## 2020-07-14 DIAGNOSIS — E559 Vitamin D deficiency, unspecified: Secondary | ICD-10-CM | POA: Diagnosis not present

## 2020-07-15 ENCOUNTER — Telehealth (HOSPITAL_COMMUNITY): Payer: PPO | Admitting: Psychiatry

## 2020-07-18 NOTE — Procedures (Signed)
[place] A. Merlene Laughter, MD     [email or web address]             NOCTURNAL POLYSOMNOGRAPHY   LOCATION: [facility]    Patient Name: Michelle Clements, Michelle Clements Date: 07/07/2020 Gender: Female D.O.B: [birth year] Age (years): 54 Referring Provider: Delphina Cahill Height (inches): 71 Interpreting Physician: Phillips Odor MD, ABSM Weight (lbs): 231 RPSGT: Rosebud Poles BMI: 32 MRN: [id number] Neck Size: 16.50 CLINICAL INFORMATION The patient is referred for a CPAP titration to treat sleep apnea.     Date of NPSG, Split Night or HST:  SLEEP STUDY TECHNIQUE As per the AASM Manual for the Scoring of Sleep and Associated Events v2.3 (April 2016) with a hypopnea requiring 4% desaturations.  The channels recorded and monitored were frontal, central and occipital EEG, electrooculogram (EOG), submentalis EMG (chin), nasal and oral airflow, thoracic and abdominal wall motion, anterior tibialis EMG, snore microphone, electrocardiogram, and pulse oximetry. Continuous positive airway pressure (CPAP) was initiated at the beginning of the study and titrated to treat sleep-disordered breathing.  MEDICATIONS Medications self-administered by patient taken the night of the study : N/A  Current Outpatient Medications:    albuterol (PROVENTIL HFA;VENTOLIN HFA) 108 (90 BASE) MCG/ACT inhaler, Inhale 2 puffs into the lungs every 6 (six) hours as needed for wheezing or shortness of breath., Disp: , Rfl:    clonazePAM (KLONOPIN) 0.5 MG tablet, Take 1 tablet (0.5 mg total) by mouth 2 (two) times daily as needed for anxiety., Disp: 60 tablet, Rfl: 2   FLUoxetine (PROZAC) 40 MG capsule, Take 1 capsule (40 mg total) by mouth daily., Disp: 30 capsule, Rfl: 2   fluticasone (FLONASE) 50 MCG/ACT nasal spray, Place 2 sprays into both nostrils daily., Disp: , Rfl:    HYDROcodone-acetaminophen (NORCO) 7.5-325 MG tablet, 1 tablet every 4 (four) hours as needed. , Disp: , Rfl:    Lancets (ONETOUCH ULTRASOFT)  lancets, 1 each by Other route as needed. , Disp: , Rfl:    Levocetirizine Dihydrochloride (XYZAL PO), Take 1 tablet by mouth daily., Disp: , Rfl:    Melatonin 5 MG TABS, Take 5 mg by mouth at bedtime as needed. , Disp: , Rfl:    metFORMIN (GLUCOPHAGE) 1000 MG tablet, Take 1,000 mg by mouth 2 (two) times daily with a meal. , Disp: , Rfl:    Multiple Vitamin (MULTIVITAMIN) tablet, Take 1 tablet by mouth daily. Reported on 02/10/2016, Disp: , Rfl:    MYRBETRIQ 25 MG TB24 tablet, Take 25 mg by mouth at bedtime., Disp: , Rfl:    naproxen sodium (ALEVE) 220 MG tablet, Take 220 mg by mouth 2 (two) times daily as needed., Disp: , Rfl:    nitrofurantoin (MACRODANTIN) 100 MG capsule, Take 100 mg by mouth every morning., Disp: , Rfl:    nitrofurantoin, macrocrystal-monohydrate, (MACROBID) 100 MG capsule, Take 100 mg by mouth daily., Disp: , Rfl:    omeprazole (PRILOSEC) 20 MG capsule, Take 20 mg by mouth daily. , Disp: , Rfl:    ONE TOUCH ULTRA TEST test strip, , Disp: , Rfl:    polyethylene glycol powder (GLYCOLAX/MIRALAX) powder, Use 1 capful or 1/2 capful daily (Patient taking differently: Use 1 capful PRN), Disp: 500 g, Rfl: 2   pregabalin (LYRICA) 150 MG capsule, Take 150 mg by mouth 3 (three) times daily., Disp: , Rfl:    risperiDONE (RISPERDAL) 1 MG tablet, Take 1 tablet (1 mg total) by mouth at bedtime., Disp: 90 tablet, Rfl: 2   rosuvastatin (CRESTOR) 40 MG tablet, Take 40  mg by mouth daily., Disp: , Rfl:    tiZANidine (ZANAFLEX) 4 MG tablet, Take 4 mg by mouth daily. 4mg  at bedtime and 2 mg during the day, Disp: , Rfl:    TECHNICIAN COMMENTS Comments added by technician: Patient tolerated CPAP therapy very well. Therapy started at 5 cm of H2O and increased to 6 cm of H2O due to mild events. Please see medication list prior to lights out time Comments added by scorer: N/A RESPIRATORY PARAMETERS Optimal PAP Pressure (cm): 6 AHI at Optimal Pressure (/hr): 0.0 Overall Minimal O2 (%): 88.0 Supine %  at Optimal Pressure (%): 0 Minimal O2 at Optimal Pressure (%): 91.0    SLEEP ARCHITECTURE The study was initiated at 10:23:11 PM and ended at 6:16:05 AM.  Sleep onset time was 3.3 minutes and the sleep efficiency was 92.1%%. The total sleep time was 435.5 minutes.  The patient spent 2.6%% of the night in stage N1 sleep, 26.2%% in stage N2 sleep, 47.3%% in stage N3 and 23.9% in REM.Stage REM latency was 180.0 minutes  Wake after sleep onset was 34.1. Alpha intrusion was absent. Supine sleep was 78.07%.  CARDIAC DATA The 2 lead EKG demonstrated sinus rhythm. The mean heart rate was 71.6 beats per minute. Other EKG findings include: None.  LEG MOVEMENT DATA The total Periodic Limb Movements of Sleep (PLMS) were 0. The PLMS index was 0.0. A PLMS index of <15 is considered normal in adults.  IMPRESSIONS The optimal CPAP is 6 cm of water.    Delano Metz, MD Diplomate, American Board of Sleep Medicine.  ELECTRONICALLY SIGNED ON:  07/18/2020, 4:00 PM [place] PH: [phone]   FX: [phone] [place]

## 2020-07-21 DIAGNOSIS — E119 Type 2 diabetes mellitus without complications: Secondary | ICD-10-CM | POA: Diagnosis not present

## 2020-08-05 DIAGNOSIS — G894 Chronic pain syndrome: Secondary | ICD-10-CM | POA: Diagnosis not present

## 2020-08-05 DIAGNOSIS — F419 Anxiety disorder, unspecified: Secondary | ICD-10-CM | POA: Diagnosis not present

## 2020-08-05 DIAGNOSIS — F3181 Bipolar II disorder: Secondary | ICD-10-CM | POA: Diagnosis not present

## 2020-08-05 DIAGNOSIS — R5382 Chronic fatigue, unspecified: Secondary | ICD-10-CM | POA: Diagnosis not present

## 2020-08-05 DIAGNOSIS — D509 Iron deficiency anemia, unspecified: Secondary | ICD-10-CM | POA: Diagnosis not present

## 2020-08-05 DIAGNOSIS — E119 Type 2 diabetes mellitus without complications: Secondary | ICD-10-CM | POA: Diagnosis not present

## 2020-08-05 DIAGNOSIS — Z712 Person consulting for explanation of examination or test findings: Secondary | ICD-10-CM | POA: Diagnosis not present

## 2020-08-05 DIAGNOSIS — D839 Common variable immunodeficiency, unspecified: Secondary | ICD-10-CM | POA: Diagnosis not present

## 2020-08-05 DIAGNOSIS — Z20822 Contact with and (suspected) exposure to covid-19: Secondary | ICD-10-CM | POA: Diagnosis not present

## 2020-08-05 DIAGNOSIS — E6609 Other obesity due to excess calories: Secondary | ICD-10-CM | POA: Diagnosis not present

## 2020-08-05 DIAGNOSIS — E039 Hypothyroidism, unspecified: Secondary | ICD-10-CM | POA: Diagnosis not present

## 2020-08-05 DIAGNOSIS — I10 Essential (primary) hypertension: Secondary | ICD-10-CM | POA: Diagnosis not present

## 2020-08-05 DIAGNOSIS — M791 Myalgia, unspecified site: Secondary | ICD-10-CM | POA: Diagnosis not present

## 2020-08-05 DIAGNOSIS — G589 Mononeuropathy, unspecified: Secondary | ICD-10-CM | POA: Diagnosis not present

## 2020-08-05 DIAGNOSIS — R945 Abnormal results of liver function studies: Secondary | ICD-10-CM | POA: Diagnosis not present

## 2020-08-05 DIAGNOSIS — I959 Hypotension, unspecified: Secondary | ICD-10-CM | POA: Diagnosis not present

## 2020-08-05 DIAGNOSIS — G4733 Obstructive sleep apnea (adult) (pediatric): Secondary | ICD-10-CM | POA: Diagnosis not present

## 2020-08-05 DIAGNOSIS — J45909 Unspecified asthma, uncomplicated: Secondary | ICD-10-CM | POA: Diagnosis not present

## 2020-08-05 DIAGNOSIS — M25552 Pain in left hip: Secondary | ICD-10-CM | POA: Diagnosis not present

## 2020-08-05 DIAGNOSIS — M4800 Spinal stenosis, site unspecified: Secondary | ICD-10-CM | POA: Diagnosis not present

## 2020-08-05 DIAGNOSIS — F339 Major depressive disorder, recurrent, unspecified: Secondary | ICD-10-CM | POA: Diagnosis not present

## 2020-08-05 DIAGNOSIS — R358 Other polyuria: Secondary | ICD-10-CM | POA: Diagnosis not present

## 2020-08-05 DIAGNOSIS — M797 Fibromyalgia: Secondary | ICD-10-CM | POA: Diagnosis not present

## 2020-08-18 DIAGNOSIS — Z0001 Encounter for general adult medical examination with abnormal findings: Secondary | ICD-10-CM | POA: Diagnosis not present

## 2020-08-18 DIAGNOSIS — F3181 Bipolar II disorder: Secondary | ICD-10-CM | POA: Diagnosis not present

## 2020-08-18 DIAGNOSIS — E559 Vitamin D deficiency, unspecified: Secondary | ICD-10-CM | POA: Diagnosis not present

## 2020-08-18 DIAGNOSIS — K760 Fatty (change of) liver, not elsewhere classified: Secondary | ICD-10-CM | POA: Diagnosis not present

## 2020-08-18 DIAGNOSIS — E1169 Type 2 diabetes mellitus with other specified complication: Secondary | ICD-10-CM | POA: Diagnosis not present

## 2020-08-18 DIAGNOSIS — D839 Common variable immunodeficiency, unspecified: Secondary | ICD-10-CM | POA: Diagnosis not present

## 2020-08-18 DIAGNOSIS — J4520 Mild intermittent asthma, uncomplicated: Secondary | ICD-10-CM | POA: Diagnosis not present

## 2020-08-18 DIAGNOSIS — F331 Major depressive disorder, recurrent, moderate: Secondary | ICD-10-CM | POA: Diagnosis not present

## 2020-08-18 DIAGNOSIS — M797 Fibromyalgia: Secondary | ICD-10-CM | POA: Diagnosis not present

## 2020-08-18 DIAGNOSIS — M545 Low back pain: Secondary | ICD-10-CM | POA: Diagnosis not present

## 2020-08-18 DIAGNOSIS — G4730 Sleep apnea, unspecified: Secondary | ICD-10-CM | POA: Diagnosis not present

## 2020-08-19 DIAGNOSIS — S233XXA Sprain of ligaments of thoracic spine, initial encounter: Secondary | ICD-10-CM | POA: Diagnosis not present

## 2020-08-19 DIAGNOSIS — S338XXA Sprain of other parts of lumbar spine and pelvis, initial encounter: Secondary | ICD-10-CM | POA: Diagnosis not present

## 2020-08-19 DIAGNOSIS — S134XXA Sprain of ligaments of cervical spine, initial encounter: Secondary | ICD-10-CM | POA: Diagnosis not present

## 2020-08-20 DIAGNOSIS — B360 Pityriasis versicolor: Secondary | ICD-10-CM | POA: Diagnosis not present

## 2020-08-20 DIAGNOSIS — L820 Inflamed seborrheic keratosis: Secondary | ICD-10-CM | POA: Diagnosis not present

## 2020-08-24 DIAGNOSIS — S233XXA Sprain of ligaments of thoracic spine, initial encounter: Secondary | ICD-10-CM | POA: Diagnosis not present

## 2020-08-24 DIAGNOSIS — S338XXA Sprain of other parts of lumbar spine and pelvis, initial encounter: Secondary | ICD-10-CM | POA: Diagnosis not present

## 2020-08-24 DIAGNOSIS — S134XXA Sprain of ligaments of cervical spine, initial encounter: Secondary | ICD-10-CM | POA: Diagnosis not present

## 2020-08-26 DIAGNOSIS — J45909 Unspecified asthma, uncomplicated: Secondary | ICD-10-CM | POA: Diagnosis not present

## 2020-08-26 DIAGNOSIS — D509 Iron deficiency anemia, unspecified: Secondary | ICD-10-CM | POA: Diagnosis not present

## 2020-08-26 DIAGNOSIS — R5382 Chronic fatigue, unspecified: Secondary | ICD-10-CM | POA: Diagnosis not present

## 2020-08-26 DIAGNOSIS — D839 Common variable immunodeficiency, unspecified: Secondary | ICD-10-CM | POA: Diagnosis not present

## 2020-08-26 DIAGNOSIS — M4800 Spinal stenosis, site unspecified: Secondary | ICD-10-CM | POA: Diagnosis not present

## 2020-08-26 DIAGNOSIS — E6609 Other obesity due to excess calories: Secondary | ICD-10-CM | POA: Diagnosis not present

## 2020-08-26 DIAGNOSIS — R945 Abnormal results of liver function studies: Secondary | ICD-10-CM | POA: Diagnosis not present

## 2020-08-26 DIAGNOSIS — E119 Type 2 diabetes mellitus without complications: Secondary | ICD-10-CM | POA: Diagnosis not present

## 2020-08-26 DIAGNOSIS — G4733 Obstructive sleep apnea (adult) (pediatric): Secondary | ICD-10-CM | POA: Diagnosis not present

## 2020-08-26 DIAGNOSIS — G589 Mononeuropathy, unspecified: Secondary | ICD-10-CM | POA: Diagnosis not present

## 2020-08-26 DIAGNOSIS — F339 Major depressive disorder, recurrent, unspecified: Secondary | ICD-10-CM | POA: Diagnosis not present

## 2020-08-26 DIAGNOSIS — G894 Chronic pain syndrome: Secondary | ICD-10-CM | POA: Diagnosis not present

## 2020-08-26 LAB — BASIC METABOLIC PANEL: Glucose: 154

## 2020-08-26 LAB — COMPREHENSIVE METABOLIC PANEL
Calcium: 10.5 (ref 8.7–10.7)
GFR calc non Af Amer: 40

## 2020-08-26 LAB — VITAMIN D 25 HYDROXY (VIT D DEFICIENCY, FRACTURES): Vit D, 25-Hydroxy: 29.6

## 2020-08-27 LAB — LIPID PANEL
Cholesterol: 171 (ref 0–200)
LDL Cholesterol: 89
Triglycerides: 147 (ref 40–160)

## 2020-09-04 ENCOUNTER — Telehealth (HOSPITAL_COMMUNITY): Payer: Self-pay | Admitting: Psychiatry

## 2020-09-04 DIAGNOSIS — M797 Fibromyalgia: Secondary | ICD-10-CM | POA: Diagnosis not present

## 2020-09-04 DIAGNOSIS — M5412 Radiculopathy, cervical region: Secondary | ICD-10-CM | POA: Diagnosis not present

## 2020-09-04 DIAGNOSIS — Z79891 Long term (current) use of opiate analgesic: Secondary | ICD-10-CM | POA: Diagnosis not present

## 2020-09-04 DIAGNOSIS — M545 Low back pain, unspecified: Secondary | ICD-10-CM | POA: Diagnosis not present

## 2020-09-04 DIAGNOSIS — M542 Cervicalgia: Secondary | ICD-10-CM | POA: Diagnosis not present

## 2020-09-04 DIAGNOSIS — G894 Chronic pain syndrome: Secondary | ICD-10-CM | POA: Diagnosis not present

## 2020-09-04 NOTE — Telephone Encounter (Signed)
Called to schedule f/u appt,  Left vm

## 2020-09-07 DIAGNOSIS — E1169 Type 2 diabetes mellitus with other specified complication: Secondary | ICD-10-CM | POA: Diagnosis not present

## 2020-09-07 DIAGNOSIS — G894 Chronic pain syndrome: Secondary | ICD-10-CM | POA: Diagnosis not present

## 2020-09-07 DIAGNOSIS — D839 Common variable immunodeficiency, unspecified: Secondary | ICD-10-CM | POA: Diagnosis not present

## 2020-09-07 DIAGNOSIS — E559 Vitamin D deficiency, unspecified: Secondary | ICD-10-CM | POA: Diagnosis not present

## 2020-09-07 DIAGNOSIS — M797 Fibromyalgia: Secondary | ICD-10-CM | POA: Diagnosis not present

## 2020-09-07 DIAGNOSIS — J4520 Mild intermittent asthma, uncomplicated: Secondary | ICD-10-CM | POA: Diagnosis not present

## 2020-09-07 DIAGNOSIS — G4730 Sleep apnea, unspecified: Secondary | ICD-10-CM | POA: Diagnosis not present

## 2020-09-07 DIAGNOSIS — F3181 Bipolar II disorder: Secondary | ICD-10-CM | POA: Diagnosis not present

## 2020-09-07 DIAGNOSIS — F514 Sleep terrors [night terrors]: Secondary | ICD-10-CM | POA: Diagnosis not present

## 2020-09-07 DIAGNOSIS — K760 Fatty (change of) liver, not elsewhere classified: Secondary | ICD-10-CM | POA: Diagnosis not present

## 2020-09-07 DIAGNOSIS — G3184 Mild cognitive impairment, so stated: Secondary | ICD-10-CM | POA: Diagnosis not present

## 2020-09-10 DIAGNOSIS — E1165 Type 2 diabetes mellitus with hyperglycemia: Secondary | ICD-10-CM | POA: Diagnosis not present

## 2020-09-10 DIAGNOSIS — E782 Mixed hyperlipidemia: Secondary | ICD-10-CM | POA: Diagnosis not present

## 2020-09-10 DIAGNOSIS — F3181 Bipolar II disorder: Secondary | ICD-10-CM | POA: Diagnosis not present

## 2020-09-10 DIAGNOSIS — G4730 Sleep apnea, unspecified: Secondary | ICD-10-CM | POA: Diagnosis not present

## 2020-09-10 DIAGNOSIS — R0602 Shortness of breath: Secondary | ICD-10-CM | POA: Diagnosis not present

## 2020-09-10 DIAGNOSIS — D839 Common variable immunodeficiency, unspecified: Secondary | ICD-10-CM | POA: Diagnosis not present

## 2020-09-10 DIAGNOSIS — F331 Major depressive disorder, recurrent, moderate: Secondary | ICD-10-CM | POA: Diagnosis not present

## 2020-09-10 DIAGNOSIS — E559 Vitamin D deficiency, unspecified: Secondary | ICD-10-CM | POA: Diagnosis not present

## 2020-09-10 DIAGNOSIS — M797 Fibromyalgia: Secondary | ICD-10-CM | POA: Diagnosis not present

## 2020-09-10 DIAGNOSIS — J4520 Mild intermittent asthma, uncomplicated: Secondary | ICD-10-CM | POA: Diagnosis not present

## 2020-09-10 DIAGNOSIS — F329 Major depressive disorder, single episode, unspecified: Secondary | ICD-10-CM | POA: Diagnosis not present

## 2020-09-10 DIAGNOSIS — E1169 Type 2 diabetes mellitus with other specified complication: Secondary | ICD-10-CM | POA: Diagnosis not present

## 2020-09-10 DIAGNOSIS — R809 Proteinuria, unspecified: Secondary | ICD-10-CM | POA: Diagnosis not present

## 2020-09-10 DIAGNOSIS — R7989 Other specified abnormal findings of blood chemistry: Secondary | ICD-10-CM | POA: Diagnosis not present

## 2020-09-10 DIAGNOSIS — Z87891 Personal history of nicotine dependence: Secondary | ICD-10-CM | POA: Diagnosis not present

## 2020-09-10 DIAGNOSIS — Z20822 Contact with and (suspected) exposure to covid-19: Secondary | ICD-10-CM | POA: Diagnosis not present

## 2020-09-10 DIAGNOSIS — K219 Gastro-esophageal reflux disease without esophagitis: Secondary | ICD-10-CM | POA: Diagnosis not present

## 2020-09-10 DIAGNOSIS — Z7984 Long term (current) use of oral hypoglycemic drugs: Secondary | ICD-10-CM | POA: Diagnosis not present

## 2020-09-10 DIAGNOSIS — G3184 Mild cognitive impairment, so stated: Secondary | ICD-10-CM | POA: Diagnosis not present

## 2020-09-10 DIAGNOSIS — J45909 Unspecified asthma, uncomplicated: Secondary | ICD-10-CM | POA: Diagnosis not present

## 2020-09-10 DIAGNOSIS — R944 Abnormal results of kidney function studies: Secondary | ICD-10-CM | POA: Diagnosis not present

## 2020-09-10 DIAGNOSIS — K760 Fatty (change of) liver, not elsewhere classified: Secondary | ICD-10-CM | POA: Diagnosis not present

## 2020-09-10 DIAGNOSIS — Z794 Long term (current) use of insulin: Secondary | ICD-10-CM | POA: Diagnosis not present

## 2020-09-10 DIAGNOSIS — R0600 Dyspnea, unspecified: Secondary | ICD-10-CM | POA: Diagnosis not present

## 2020-09-14 DIAGNOSIS — J45909 Unspecified asthma, uncomplicated: Secondary | ICD-10-CM | POA: Diagnosis not present

## 2020-09-14 DIAGNOSIS — F339 Major depressive disorder, recurrent, unspecified: Secondary | ICD-10-CM | POA: Diagnosis not present

## 2020-09-14 DIAGNOSIS — G894 Chronic pain syndrome: Secondary | ICD-10-CM | POA: Diagnosis not present

## 2020-09-14 DIAGNOSIS — J4520 Mild intermittent asthma, uncomplicated: Secondary | ICD-10-CM | POA: Diagnosis not present

## 2020-09-14 DIAGNOSIS — G3184 Mild cognitive impairment, so stated: Secondary | ICD-10-CM | POA: Diagnosis not present

## 2020-09-14 DIAGNOSIS — E119 Type 2 diabetes mellitus without complications: Secondary | ICD-10-CM | POA: Diagnosis not present

## 2020-09-14 DIAGNOSIS — D839 Common variable immunodeficiency, unspecified: Secondary | ICD-10-CM | POA: Diagnosis not present

## 2020-09-14 DIAGNOSIS — E559 Vitamin D deficiency, unspecified: Secondary | ICD-10-CM | POA: Diagnosis not present

## 2020-09-14 DIAGNOSIS — M797 Fibromyalgia: Secondary | ICD-10-CM | POA: Diagnosis not present

## 2020-09-14 DIAGNOSIS — E6609 Other obesity due to excess calories: Secondary | ICD-10-CM | POA: Diagnosis not present

## 2020-09-14 DIAGNOSIS — G4733 Obstructive sleep apnea (adult) (pediatric): Secondary | ICD-10-CM | POA: Diagnosis not present

## 2020-09-14 DIAGNOSIS — D509 Iron deficiency anemia, unspecified: Secondary | ICD-10-CM | POA: Diagnosis not present

## 2020-09-14 DIAGNOSIS — R5382 Chronic fatigue, unspecified: Secondary | ICD-10-CM | POA: Diagnosis not present

## 2020-09-14 DIAGNOSIS — K760 Fatty (change of) liver, not elsewhere classified: Secondary | ICD-10-CM | POA: Diagnosis not present

## 2020-09-14 DIAGNOSIS — E1169 Type 2 diabetes mellitus with other specified complication: Secondary | ICD-10-CM | POA: Diagnosis not present

## 2020-09-14 DIAGNOSIS — R945 Abnormal results of liver function studies: Secondary | ICD-10-CM | POA: Diagnosis not present

## 2020-09-14 DIAGNOSIS — G4730 Sleep apnea, unspecified: Secondary | ICD-10-CM | POA: Diagnosis not present

## 2020-09-14 DIAGNOSIS — M4800 Spinal stenosis, site unspecified: Secondary | ICD-10-CM | POA: Diagnosis not present

## 2020-09-14 DIAGNOSIS — F514 Sleep terrors [night terrors]: Secondary | ICD-10-CM | POA: Diagnosis not present

## 2020-09-14 DIAGNOSIS — G589 Mononeuropathy, unspecified: Secondary | ICD-10-CM | POA: Diagnosis not present

## 2020-09-14 DIAGNOSIS — F3181 Bipolar II disorder: Secondary | ICD-10-CM | POA: Diagnosis not present

## 2020-09-14 LAB — BASIC METABOLIC PANEL
Glucose: 388
Potassium: 5.3 (ref 3.4–5.3)

## 2020-09-14 LAB — COMPREHENSIVE METABOLIC PANEL
Calcium: 10.3 (ref 8.7–10.7)
GFR calc non Af Amer: 46

## 2020-09-24 DIAGNOSIS — F3181 Bipolar II disorder: Secondary | ICD-10-CM | POA: Diagnosis not present

## 2020-09-24 DIAGNOSIS — E559 Vitamin D deficiency, unspecified: Secondary | ICD-10-CM | POA: Diagnosis not present

## 2020-09-24 DIAGNOSIS — G3184 Mild cognitive impairment, so stated: Secondary | ICD-10-CM | POA: Diagnosis not present

## 2020-09-24 DIAGNOSIS — G894 Chronic pain syndrome: Secondary | ICD-10-CM | POA: Diagnosis not present

## 2020-09-24 DIAGNOSIS — E1169 Type 2 diabetes mellitus with other specified complication: Secondary | ICD-10-CM | POA: Diagnosis not present

## 2020-09-24 DIAGNOSIS — G4730 Sleep apnea, unspecified: Secondary | ICD-10-CM | POA: Diagnosis not present

## 2020-09-24 DIAGNOSIS — K760 Fatty (change of) liver, not elsewhere classified: Secondary | ICD-10-CM | POA: Diagnosis not present

## 2020-09-24 DIAGNOSIS — F514 Sleep terrors [night terrors]: Secondary | ICD-10-CM | POA: Diagnosis not present

## 2020-09-24 DIAGNOSIS — D839 Common variable immunodeficiency, unspecified: Secondary | ICD-10-CM | POA: Diagnosis not present

## 2020-09-24 DIAGNOSIS — J4520 Mild intermittent asthma, uncomplicated: Secondary | ICD-10-CM | POA: Diagnosis not present

## 2020-09-24 DIAGNOSIS — M797 Fibromyalgia: Secondary | ICD-10-CM | POA: Diagnosis not present

## 2020-09-24 LAB — TSH: TSH: 1.66 (ref 0.41–5.90)

## 2020-10-06 DIAGNOSIS — Z79891 Long term (current) use of opiate analgesic: Secondary | ICD-10-CM | POA: Diagnosis not present

## 2020-10-06 DIAGNOSIS — M797 Fibromyalgia: Secondary | ICD-10-CM | POA: Diagnosis not present

## 2020-10-06 DIAGNOSIS — M5412 Radiculopathy, cervical region: Secondary | ICD-10-CM | POA: Diagnosis not present

## 2020-10-06 DIAGNOSIS — M545 Low back pain, unspecified: Secondary | ICD-10-CM | POA: Diagnosis not present

## 2020-10-06 DIAGNOSIS — M542 Cervicalgia: Secondary | ICD-10-CM | POA: Diagnosis not present

## 2020-10-07 DIAGNOSIS — R102 Pelvic and perineal pain: Secondary | ICD-10-CM | POA: Diagnosis not present

## 2020-10-07 DIAGNOSIS — N952 Postmenopausal atrophic vaginitis: Secondary | ICD-10-CM | POA: Diagnosis not present

## 2020-10-07 DIAGNOSIS — Z124 Encounter for screening for malignant neoplasm of cervix: Secondary | ICD-10-CM | POA: Diagnosis not present

## 2020-10-07 DIAGNOSIS — Z01419 Encounter for gynecological examination (general) (routine) without abnormal findings: Secondary | ICD-10-CM | POA: Diagnosis not present

## 2020-10-14 DIAGNOSIS — N888 Other specified noninflammatory disorders of cervix uteri: Secondary | ICD-10-CM | POA: Diagnosis not present

## 2020-10-14 DIAGNOSIS — N858 Other specified noninflammatory disorders of uterus: Secondary | ICD-10-CM | POA: Diagnosis not present

## 2020-10-14 DIAGNOSIS — Z9851 Tubal ligation status: Secondary | ICD-10-CM | POA: Diagnosis not present

## 2020-10-14 DIAGNOSIS — R102 Pelvic and perineal pain: Secondary | ICD-10-CM | POA: Diagnosis not present

## 2020-10-14 DIAGNOSIS — Z780 Asymptomatic menopausal state: Secondary | ICD-10-CM | POA: Diagnosis not present

## 2020-10-15 ENCOUNTER — Other Ambulatory Visit: Payer: Self-pay

## 2020-10-15 ENCOUNTER — Ambulatory Visit: Payer: PPO | Admitting: Nurse Practitioner

## 2020-10-15 ENCOUNTER — Encounter: Payer: Self-pay | Admitting: Nurse Practitioner

## 2020-10-15 ENCOUNTER — Telehealth (HOSPITAL_COMMUNITY): Payer: PPO | Admitting: Psychiatry

## 2020-10-15 VITALS — BP 144/85 | HR 90 | Ht 71.0 in | Wt 235.0 lb

## 2020-10-15 DIAGNOSIS — Z794 Long term (current) use of insulin: Secondary | ICD-10-CM

## 2020-10-15 DIAGNOSIS — E1165 Type 2 diabetes mellitus with hyperglycemia: Secondary | ICD-10-CM

## 2020-10-15 NOTE — Patient Instructions (Signed)

## 2020-10-15 NOTE — Progress Notes (Signed)
Endocrinology Consult Note       10/15/2020, 4:30 PM   Subjective:    Patient ID: Michelle Clements, female    DOB: 14-Mar-1966.  Michelle Clements is being seen in consultation for management of currently uncontrolled symptomatic diabetes requested by  [name], MD.   Past Medical History:  Diagnosis Date  . Allergic rhinitis   . Anemia   . Arthritis    Lower back and hips  . Asthma   . Bipolar disorder ([place])   . 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[place])   . Constipation   . 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 Depression   . Essential hypertension, benign   . Fibromyalgia   . GERD (gastroesophageal reflux disease)   . H/O sleep apnea   . Hip pain, left   . History of palpitations    Negative Holter monitor  . History of pneumonia 1988  . Hyperlipidemia   . IBS (irritable bowel syndrome)   . Neck pain   . Poor short term memory   . PTSD (post-traumatic stress disorder)   . S/P Botox injection 11/2014    For migraine headaches  . Type 2 diabetes mellitus ([place])   . Urgency of urination     Past Surgical History:  Procedure Laterality Date  . CARPAL TUNNEL RELEASE Right 06/10/2013   Procedure: CARPAL TUNNEL RELEASE;  Surgeon: Faythe Ghee, MD;  Location: [place] [facility];  Service: Neurosurgery;  Laterality: Right;  Right Carpal Tunnel Release   . CHOLECYSTECTOMY    . DENTAL SURGERY  12/2014  . mole exc     x 3  . MUSCLE BIOPSY  2008   Right leg  . TUBAL LIGATION      Social History   Socioeconomic History  . Marital status: Married    Spouse name: Not on file  . Number of children: 2  . Years of education: College  . Highest education level: Not on file  Occupational History  . 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 Smoking status: Former Smoker    Types: Cigarettes    Quit date: 11/04/2010     Years since quitting: 9.9  . Smokeless tobacco: Never Used  Substance and Sexual Activity  . Alcohol use: Yes    Alcohol/week: 0.0 standard drinks    Comment: one mixed drink per week  . Drug use: No  . Sexual activity: Yes    Birth control/protection: Surgical  Other Topics Concern  . Not on file  Social History Narrative   Married   Lives with spouse and daughter   Right handed.   Caffeine use: 2 cups coffee in morning and 2 cups tea during the day    Social Determinants of Health   Financial Resource Strain:   . Difficulty of Paying Living Expenses: Not on file  Food Insecurity:   . Worried About Charity fundraiser in the Last Year: Not on file  . Ran Out of Food in the Last Year: Not on file  Transportation  Needs:   . Lack of Transportation (Medical): Not on file  . Lack of Transportation (Non-Medical): Not on file  Physical Activity:   . Days of Exercise per Week: Not on file  . Minutes of Exercise per Session: Not on file  Stress:   . Feeling of Stress : Not on file  Social Connections:   . Frequency of Communication with Friends and Family: Not on file  . Frequency of Social Gatherings with Friends and Family: Not on file  . Attends Religious Services: Not on file  . Active Member of Clubs or Organizations: Not on file  . Attends Archivist Meetings: Not on file  . Marital Status: Not on file    Family History  Problem Relation Age of Onset  . Fibromyalgia Mother   . Diabetes type II Mother   . Depression Mother   . Alzheimer's disease Mother   . GER disease Mother   . Heart disease Mother   . Paranoid behavior Mother   . Dementia Mother   . Heart attack Father 31       MI x5  . Stroke Father        CVA x7  . Asthma Father   . Brain cancer Father   . Seizures Father   . Anxiety disorder Sister   . OCD Sister   . Sexual abuse Sister   . Physical abuse Sister   . Anxiety disorder Sister   . ADD / ADHD Daughter   . ADD / ADHD Daughter    . Alcohol abuse Neg Hx   . Drug abuse Neg Hx   . Schizophrenia Neg Hx     Outpatient Encounter Medications as of 10/15/2020  Medication Sig  . albuterol (PROVENTIL HFA;VENTOLIN HFA) 108 (90 BASE) MCG/ACT inhaler Inhale 2 puffs into the lungs every 6 (six) hours as needed for wheezing or shortness of breath.  . Blood Glucose Monitoring Suppl ([place]) w/Device KIT See admin instructions.  . Cholecalciferol (VITAMIN D) 50 MCG (2000 UT) tablet Take 1,000 Units by mouth daily.  . clonazePAM (KLONOPIN) 0.5 MG tablet Take 1 tablet (0.5 mg total) by mouth 2 (two) times daily as needed for anxiety.  Marland Kitchen estradiol (ESTRACE) 0.1 MG/GM vaginal cream Place vaginally.  Marland Kitchen FLUoxetine (PROZAC) 40 MG capsule Take 1 capsule (40 mg total) by mouth daily.  . fluticasone (FLONASE) 50 MCG/ACT nasal spray Place 2 sprays into both nostrils daily.  Marland Kitchen HYDROcodone-acetaminophen (NORCO) 7.5-325 MG tablet 1 tablet every 4 (four) hours as needed.   Marland Kitchen ibuprofen (ADVIL) 800 MG tablet Take 800 mg by mouth 2 (two) times daily.  . insulin glargine (LANTUS SOLOSTAR) 100 UNIT/ML Solostar Pen Inject 75 Units into the skin daily.  . Lancets (ONETOUCH ULTRASOFT) lancets 1 each by Other route as needed.   Marland Kitchen levocetirizine (XYZAL) 5 MG tablet Take 5 mg by mouth at bedtime.  Marland Kitchen lubiprostone (AMITIZA) 24 MCG capsule TAKE ONE CAPSULE BY MOUTH EVERY MORNING and TAKE ONE CAPSULE BY MOUTH EVERY EVENING  . Melatonin 5 MG TABS Take 5 mg by mouth at bedtime as needed.   . metFORMIN (GLUCOPHAGE) 500 MG tablet Take 500 mg by mouth 2 (two) times daily.  . naproxen sodium (ALEVE) 220 MG tablet Take 220 mg by mouth 2 (two) times daily as needed.  . nitrofurantoin (MACRODANTIN) 100 MG capsule Take 100 mg by mouth every morning.  Marland Kitchen omeprazole (PRILOSEC) 20 MG capsule Take 20 mg by mouth daily.   Marland Kitchen  ONE TOUCH ULTRA TEST test strip   . oxybutynin (DITROPAN) 5 MG tablet TAKE ONE TABLET BY MOUTH EVERYDAY AT BEDTIME FOR FOR BLADDER  control  . pregabalin (LYRICA) 150 MG capsule Take 150 mg by mouth 3 (three) times daily.  . rosuvastatin (CRESTOR) 40 MG tablet Take 40 mg by mouth daily.  . sitaGLIPtin (JANUVIA) 100 MG tablet Take 100 mg by mouth daily.  Marland Kitchen tiZANidine (ZANAFLEX) 4 MG tablet Take 4 mg by mouth daily. 78m at bedtime and 2 mg during the day  . vitamin B-12 (CYANOCOBALAMIN) 100 MCG tablet Take 100 mcg by mouth daily.  . [DISCONTINUED] Levocetirizine Dihydrochloride (XYZAL PO) Take 1 tablet by mouth daily.  . risperiDONE (RISPERDAL) 1 MG tablet Take 1 tablet (1 mg total) by mouth at bedtime.  . [DISCONTINUED] metFORMIN (GLUCOPHAGE) 1000 MG tablet Take 1,000 mg by mouth 2 (two) times daily with a meal.   . [DISCONTINUED] Multiple Vitamin (MULTIVITAMIN) tablet Take 1 tablet by mouth daily. Reported on 02/10/2016  . [DISCONTINUED] MYRBETRIQ 25 MG TB24 tablet Take 25 mg by mouth at bedtime.  . [DISCONTINUED] nitrofurantoin, macrocrystal-monohydrate, (MACROBID) 100 MG capsule Take 100 mg by mouth daily.  . [DISCONTINUED] polyethylene glycol powder (GLYCOLAX/MIRALAX) powder Use 1 capful or 1/2 capful daily (Patient taking differently: Use 1 capful PRN)   No facility-administered encounter medications on file as of 10/15/2020.    ALLERGIES: Allergies  Allergen Reactions  . Mold Extract  [Trichophyton] Shortness Of Breath    wheezing  . Other Anaphylaxis, Shortness Of Breath and Other (See Comments)     : Angioedema wheezing Wheezing wheezing Other reaction(s): Cough Wheezing Itching and rash Wheezing wheezing  . Sulfa Antibiotics Anaphylaxis     : Angioedema   . Sulfonamide Derivatives Anaphylaxis     : Angioedema  . Gluten Meal Itching  . Molds & Smuts   . Clonazepam Other (See Comments)    Confusion and memory loss Caused nconfusion  . Dust Mite Extract   . Tegretol [Carbamazepine] Rash    VACCINATION STATUS: Immunization History  Administered Date(s) Administered  . Influenza Whole 10/03/2007   . Influenza-Unspecified 09/18/2014, 09/23/2015  . Pneumococcal Polysaccharide-23 10/14/2012    Diabetes She presents for her initial diabetic visit. She has type 2 diabetes mellitus. Onset time: She was diagnosed at approximate age of 89 Her disease course has been improving. There are no hypoglycemic associated symptoms. Associated symptoms include fatigue. Pertinent negatives for diabetes include no blurred vision, no polydipsia, no polyphagia and no polyuria. There are no hypoglycemic complications. Symptoms are stable. Diabetic complications include nephropathy and retinopathy. Risk factors for coronary artery disease include diabetes mellitus, dyslipidemia, obesity and sedentary lifestyle. Current diabetic treatment includes insulin injections and oral agent (dual therapy). She is compliant with treatment most of the time. Her weight is increasing steadily. She is following a generally unhealthy diet. When asked about meal planning, she reported none. She has not had a previous visit with a dietitian. She rarely participates in exercise. Her breakfast blood glucose range is generally 110-130 mg/dl. Her lunch blood glucose range is generally 90-110 mg/dl. Her dinner blood glucose range is generally 110-130 mg/dl. Her bedtime blood glucose range is generally >200 mg/dl. (She presents today for her consultation, accompanied by her husband, with her meter and logs.  She recently saw her PCP who started her on Lantus and instructed her to titrate up by 5 units every few days until her glucose is less than 100.  She is currently on Lantus  75 units, Metformin 500 mg po twice daily with meals, and Januvia 100 mg po daily.  She is unsure of her last A1c, says she has not had it checked recently.  She denies any episodes of hypoglycemia.  She endorses the consumption of sweetened beverages and snacking.) An ACE inhibitor/angiotensin II receptor blocker is not being taken. She does not see a podiatrist.Eye exam is  current.  Hyperlipidemia This is a chronic problem. The current episode started more than 1 year ago. The problem is controlled. Recent lipid tests were reviewed and are normal. Exacerbating diseases include diabetes and obesity. There are no known factors aggravating her hyperlipidemia. Current antihyperlipidemic treatment includes statins. The current treatment provides moderate improvement of lipids. There are no compliance problems.  Risk factors for coronary artery disease include diabetes mellitus, dyslipidemia, obesity and a sedentary lifestyle.    Review of systems  Constitutional: + Minimally fluctuating body weight,  current Body mass index is 32.78 kg/m. , no fatigue, no subjective hyperthermia, no subjective hypothermia Eyes: no blurry vision, no xerophthalmia ENT: no sore throat, no nodules palpated in throat, no dysphagia/odynophagia, no hoarseness Cardiovascular: no chest pain, no shortness of breath, no palpitations, no leg swelling Respiratory: no cough, no shortness of breath Gastrointestinal: no nausea/vomiting/diarrhea Musculoskeletal: no muscle/joint aches Skin: no rashes, no hyperemia Neurological: no tremors, no numbness, no tingling, no dizziness Psychiatric: no depression, no anxiety   Objective:    BP (!) 144/85 (BP Location: Right Arm, Patient Position: Sitting)   Pulse 90   Ht 5' 11"  (1.803 m)   Wt 235 lb (106.6 kg)   BMI 32.78 kg/m   Wt Readings from Last 3 Encounters:  10/15/20 235 lb (106.6 kg)  08/29/19 231 lb (104.8 kg)  07/10/19 230 lb (104.3 kg)    BP Readings from Last 3 Encounters:  10/15/20 (!) 144/85  01/20/20 111/75  08/29/19 130/78    Physical Exam- Limited  Constitutional:  Body mass index is 32.78 kg/m. , not in acute distress, normal state of mind Eyes:  EOMI, no exophthalmos Neck: Supple Thyroid: No gross goiter Cardiovascular: RRR, no murmers, rubs, or gallops, no edema Respiratory: Adequate breathing efforts, no crackles,  rales, rhonchi, or wheezing Musculoskeletal: no gross deformities, strength intact in all four extremities, no gross restriction of joint movements Skin:  no rashes, no hyperemia Neurological: no tremor with outstretched hands   CMP ( most recent) CMP     Component Value Date/Time   NA 140 07/03/2019 1230   K 5.3 09/14/2020 0000   CL 104 07/03/2019 1230   CO2 28 07/03/2019 1230   GLUCOSE 119 (H) 07/03/2019 1230   BUN 12 07/03/2019 1230   CREATININE 1.00 07/03/2019 1230   CREATININE 0.88 02/21/2012 1153   CALCIUM 10.3 09/14/2020 0000   PROT 7.0 07/03/2019 1230   ALBUMIN 4.6 07/03/2019 1230   AST 54 (H) 07/03/2019 1230   ALT 39 07/03/2019 1230   ALKPHOS 49 07/03/2019 1230   BILITOT 0.9 07/03/2019 1230   GFRNONAA 46 09/14/2020 0000   GFRAA >60 07/03/2019 1230     Diabetic Labs (most recent): Lab Results  Component Value Date   HGBA1C 6.2 (H) 03/18/2015     Lipid Panel ( most recent) Lipid Panel     Component Value Date/Time   CHOL 171 08/26/2020 0000   TRIG 147 08/26/2020 0000   HDL 60 03/18/2015 0915   CHOLHDL 4.2 03/18/2015 0915   VLDL 44 (H) 03/18/2015 0915   LDLCALC 89 08/26/2020 0000  Lab Results  Component Value Date   TSH 1.66 09/14/2020   TSH 2.770 08/29/2019   TSH 3.261 10/14/2012   TSH 2.651 02/21/2012   TSH 1.981 03/06/2009   TSH 3.250 01/23/2008           Assessment & Plan:   1) Type 2 diabetes mellitus with hyperglycemia, with long-term current use of insulin (HCC)  - Michelle Clements has currently uncontrolled symptomatic type 2 DM since 54 years of age,  with most recent A1c of 6.9 % (unsure of date).   Recent labs reviewed, showing CKD stage 3.  - I had a long discussion with her about the progressive nature of diabetes and the pathology behind its complications. -her diabetes is complicated by sleep apnea, HLD, retinopathy and she remains at a high risk for more acute and chronic complications which include CAD, CVA, CKD,  retinopathy, and neuropathy. These are all discussed in detail with her.  - I have counseled her on diet  and weight management  by adopting a carbohydrate restricted/protein rich diet. Patient is encouraged to switch to  unprocessed or minimally processed     complex starch and increased protein intake (animal or plant source), fruits, and vegetables. -  she is advised to stick to a routine mealtimes to eat 3 meals  a day and avoid unnecessary snacks ( to snack only to correct hypoglycemia).   - she admits that there is a room for improvement in her food and drink choices. - Suggestion is made for her to avoid simple carbohydrates  from her diet including Cakes, Sweet Desserts, Ice Cream, Soda (diet and regular), Sweet Tea, Candies, Chips, Cookies, Store Bought Juices, Alcohol in Excess of  1-2 drinks a day, Artificial Sweeteners,  Coffee Creamer, and "Sugar-free" Products. This will help patient to have more stable blood glucose profile and potentially avoid unintended weight gain.  - she will be scheduled with Jearld Fenton, RDN, CDE for diabetes education.  - I have approached her with the following individualized plan to manage  her diabetes and patient agrees:   -She is advised to continue current medication regimen consisting of Januvia 100 mg po daily, Metformin 500 mg po twice daily with meals, and Lantus 75 units SQ daily at bedtime.  -She is encouraged to monitor her blood glucose 4 times daily, before meals and before bed, and log her results on the clinic logs provided to bring back to her follow up appointment in 1 week.  - she is warned not to take insulin without proper monitoring per orders. - Adjustment parameters are given to her for hypo and hyperglycemia in writing. - she is advised to continue Metformin 500 mg po twice daily with meals, therapeutically suitable for patient .  - Specific targets for  A1c;  LDL, HDL,  and Triglycerides were discussed with the patient.  2)  Blood Pressure /Hypertension:   her blood pressure is not controlled to target.  She is not on any antihypertensive medications.  She will be considered for low dose ACE/ARB if BP is above 140/90 for 3 separate visits.  3) Lipids/Hyperlipidemia:    Review of her recent lipid panel from 08/26/20 showed controlled  LDL at 89 .  she  is advised to continue    Crestor 40 mg daily at bedtime.  Side effects and precautions discussed with her.  4)  Weight/Diet:  Her Body mass index is 32.78 kg/m.  -  clearly complicating her diabetes care.   she  is a candidate for weight loss. I discussed with her the fact that loss of 5 - 10% of her  current body weight will have the most impact on her diabetes management.  Exercise, and detailed carbohydrates information provided  -  detailed on discharge instructions.  5) Chronic Care/Health Maintenance: -she is on Statin medications and  is encouraged to initiate and continue to follow up with Ophthalmology, Dentist,  Podiatrist at least yearly or according to recommendations, and advised to stay away from smoking. I have recommended yearly flu vaccine and pneumonia vaccine at least every 5 years; moderate intensity exercise for up to 150 minutes weekly; and  sleep for at least 7 hours a day.  - she is  advised to maintain close follow up with [name], MD for primary care needs, as well as her other providers for optimal and coordinated care.   - Time spent in this patient care: 60 min, of which > 50% was spent in  counseling  her about her diabetes and the rest reviewing her blood glucose logs , discussing her hypoglycemia and hyperglycemia episodes, reviewing her current and  previous labs / studies  ( including abstraction from other facilities) and medications  doses and developing a  long term treatment plan based on the latest standards of care/ guidelines; and documenting her care.    Please refer to Patient Instructions for Blood Glucose Monitoring and  Insulin/Medications Dosing Guide"  in media tab for additional information. Please  also refer to " Patient Self Inventory" in the Media  tab for reviewed elements of pertinent patient history.  Michelle Clements participated in the discussions, expressed understanding, and voiced agreement with the above plans.  All questions were answered to her satisfaction. she is encouraged to contact clinic should she have any questions or concerns prior to her return visit.   Follow up plan: - Return in about 1 week (around 10/22/2020) for Diabetes follow up, Bring glucometer and logs, No previsit labs.  Rayetta Pigg, [facility] [address] [place], [place] [address] Phone: [phone] Fax: [phone]  10/15/2020, 4:30 PM

## 2020-10-16 DIAGNOSIS — N859 Noninflammatory disorder of uterus, unspecified: Secondary | ICD-10-CM | POA: Diagnosis not present

## 2020-10-16 DIAGNOSIS — Z6833 Body mass index (BMI) 33.0-33.9, adult: Secondary | ICD-10-CM | POA: Diagnosis not present

## 2020-10-16 DIAGNOSIS — E669 Obesity, unspecified: Secondary | ICD-10-CM | POA: Diagnosis not present

## 2020-10-16 DIAGNOSIS — R9389 Abnormal findings on diagnostic imaging of other specified body structures: Secondary | ICD-10-CM | POA: Diagnosis not present

## 2020-10-19 ENCOUNTER — Telehealth (INDEPENDENT_AMBULATORY_CARE_PROVIDER_SITE_OTHER): Payer: PPO | Admitting: Psychiatry

## 2020-10-19 ENCOUNTER — Other Ambulatory Visit: Payer: Self-pay

## 2020-10-19 ENCOUNTER — Encounter (HOSPITAL_COMMUNITY): Payer: Self-pay | Admitting: Psychiatry

## 2020-10-19 DIAGNOSIS — M797 Fibromyalgia: Secondary | ICD-10-CM | POA: Diagnosis not present

## 2020-10-19 DIAGNOSIS — F3181 Bipolar II disorder: Secondary | ICD-10-CM | POA: Diagnosis not present

## 2020-10-19 DIAGNOSIS — D839 Common variable immunodeficiency, unspecified: Secondary | ICD-10-CM | POA: Diagnosis not present

## 2020-10-19 DIAGNOSIS — K760 Fatty (change of) liver, not elsewhere classified: Secondary | ICD-10-CM | POA: Diagnosis not present

## 2020-10-19 DIAGNOSIS — F332 Major depressive disorder, recurrent severe without psychotic features: Secondary | ICD-10-CM

## 2020-10-19 DIAGNOSIS — E559 Vitamin D deficiency, unspecified: Secondary | ICD-10-CM | POA: Diagnosis not present

## 2020-10-19 DIAGNOSIS — G3184 Mild cognitive impairment, so stated: Secondary | ICD-10-CM | POA: Diagnosis not present

## 2020-10-19 DIAGNOSIS — E1169 Type 2 diabetes mellitus with other specified complication: Secondary | ICD-10-CM | POA: Diagnosis not present

## 2020-10-19 DIAGNOSIS — G4730 Sleep apnea, unspecified: Secondary | ICD-10-CM | POA: Diagnosis not present

## 2020-10-19 DIAGNOSIS — G894 Chronic pain syndrome: Secondary | ICD-10-CM | POA: Diagnosis not present

## 2020-10-19 DIAGNOSIS — J4520 Mild intermittent asthma, uncomplicated: Secondary | ICD-10-CM | POA: Diagnosis not present

## 2020-10-19 DIAGNOSIS — F514 Sleep terrors [night terrors]: Secondary | ICD-10-CM | POA: Diagnosis not present

## 2020-10-19 MED ORDER — FLUOXETINE HCL 40 MG PO CAPS: 40.0000 mg | ORAL_CAPSULE | Freq: Every day | ORAL | 2 refills | Status: DC

## 2020-10-19 MED ORDER — CLONAZEPAM 0.5 MG PO TABS: 0.5000 mg | ORAL_TABLET | Freq: Two times a day (BID) | ORAL | 2 refills | Status: DC | PRN
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
## 2020-10-21 DIAGNOSIS — Z23 Encounter for immunization: Secondary | ICD-10-CM | POA: Diagnosis not present

## 2020-10-22 ENCOUNTER — Other Ambulatory Visit: Payer: Self-pay

## 2020-10-22 ENCOUNTER — Encounter: Payer: Self-pay | Admitting: Nurse Practitioner

## 2020-10-22 ENCOUNTER — Ambulatory Visit (INDEPENDENT_AMBULATORY_CARE_PROVIDER_SITE_OTHER): Payer: PPO | Admitting: Nurse Practitioner

## 2020-10-22 VITALS — BP 153/82 | HR 71 | Wt 232.0 lb

## 2020-10-22 DIAGNOSIS — E1165 Type 2 diabetes mellitus with hyperglycemia: Secondary | ICD-10-CM

## 2020-10-22 DIAGNOSIS — Z794 Long term (current) use of insulin: Secondary | ICD-10-CM

## 2020-10-22 LAB — POCT UA - MICROALBUMIN: Microalbumin Ur, POC: 150 mg/L

## 2020-10-22 LAB — POCT GLYCOSYLATED HEMOGLOBIN (HGB A1C): HbA1c, POC (controlled diabetic range): 7.9 % — AB (ref 0.0–7.0)

## 2020-10-22 NOTE — Addendum Note (Signed)
Addended by: Brita Romp on: 10/22/2020 12:03 PM   Modules accepted: Orders

## 2020-10-22 NOTE — Patient Instructions (Signed)

## 2020-10-22 NOTE — Progress Notes (Signed)
Endocrinology Follow Up Visit       10/22/2020, 12:01 PM   Subjective:    Patient ID: Michelle Clements, female    DOB: 24-Oct-1966.  Michelle Clements is being seen in follow up after being seen in consultation for management of currently uncontrolled symptomatic diabetes requested by  [name], MD.   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[name])    Depression    Essential hypertension, benign    Fibromyalgia    GERD (gastroesophageal reflux disease)    H/O sleep apnea    Hip pain, left    History of palpitations    Negative Holter monitor   History of pneumonia 1988   Hyperlipidemia    IBS (irritable bowel syndrome)    Neck pain    Poor short term memory    PTSD (post-traumatic stress disorder)    S/P Botox injection 11/2014    For migraine headaches   Type 2 diabetes mellitus ([name])    Urgency of urination     Past Surgical History:  Procedure Laterality Date   CARPAL TUNNEL RELEASE Right 06/10/2013   Procedure: CARPAL TUNNEL RELEASE;  Surgeon: Faythe Ghee, MD;  Location: [facility];  Service: Neurosurgery;  Laterality: Right;  Right Carpal Tunnel Release    CHOLECYSTECTOMY     DENTAL SURGERY  12/2014   mole exc     x 3   MUSCLE BIOPSY  2008   Right leg   TUBAL LIGATION      Social History   Socioeconomic History   Marital status: Married    Spouse name: Not on file   Number of children: 2   Years of education: College   Highest education level: Not on file  Occupational History   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   Caffeine use: 2 cups coffee in morning and 2 cups tea during the day    Social Determinants of Health   Financial Resource Strain:    Difficulty of Paying Living Expenses: Not on file  Food Insecurity:    Worried About Charity fundraiser in the Last Year: Not on file   [employer] of Food in the Last Year: Not on file  Transportation Needs:  Lack of Transportation (Medical): Not on file   Lack of Transportation (Non-Medical): Not on file  Physical Activity:    Days of Exercise per Week: Not on file   Minutes of Exercise per Session: Not on file  Stress:    Feeling of Stress : Not on file  Social Connections:    Frequency of Communication with Friends and Family: Not on file   Frequency of Social Gatherings with Friends and Family: Not on file   Attends Religious Services: Not on file   Active Member of Clubs or Organizations: Not on file   Attends Archivist Meetings: Not on file   Marital Status: Not on file    Family History  Problem Relation Age of Onset   Fibromyalgia Mother    Diabetes type II Mother    Depression Mother    Alzheimer's disease Mother    GER disease Mother    Heart disease Mother    Paranoid behavior Mother    Dementia Mother    Heart attack Father 28       MI x5   Stroke Father        CVA x7   Asthma Father    Brain cancer Father    Seizures Father    Anxiety disorder Sister    OCD Sister    Sexual abuse Sister    Physical abuse Sister    Anxiety disorder Sister    ADD / ADHD Daughter     ADD / ADHD Daughter    Alcohol abuse Neg Hx    Drug abuse Neg Hx    Schizophrenia Neg Hx     Outpatient Encounter Medications as of 10/22/2020  Medication Sig   albuterol (PROVENTIL HFA;VENTOLIN HFA) 108 (90 BASE) MCG/ACT inhaler Inhale 2 puffs into the lungs every 6 (six) hours as needed for wheezing or shortness of breath.   Blood Glucose Monitoring Suppl ([place]) w/Device KIT See admin instructions.   Cholecalciferol (VITAMIN D) 50 MCG (2000 UT) tablet Take 1,000 Units by mouth daily.   clonazePAM (KLONOPIN) 0.5 MG tablet Take 1 tablet (0.5 mg total) by mouth 2 (two) times daily as needed for anxiety.   estradiol (ESTRACE) 0.1 MG/GM vaginal cream Place vaginally.   FLUoxetine (PROZAC) 40 MG capsule Take 1 capsule (40 mg total) by mouth daily.   fluticasone (FLONASE) 50 MCG/ACT nasal spray Place 2 sprays into both nostrils daily.   HYDROcodone-acetaminophen (NORCO) 7.5-325 MG tablet 1 tablet every 4 (four) hours as needed.    ibuprofen (ADVIL) 800 MG tablet Take 800 mg by mouth 2 (two) times daily.   insulin glargine (LANTUS SOLOSTAR) 100 UNIT/ML Solostar Pen Inject 65 Units into the skin daily.   Lancets (ONETOUCH ULTRASOFT) lancets 1 each by Other route as needed.    levocetirizine (XYZAL) 5 MG tablet Take 5 mg by mouth at bedtime.   lubiprostone (AMITIZA) 24 MCG capsule TAKE ONE CAPSULE BY MOUTH EVERY MORNING and TAKE ONE CAPSULE BY MOUTH EVERY EVENING   Melatonin 5 MG TABS Take 5 mg by mouth at bedtime as needed.    metFORMIN (GLUCOPHAGE) 500 MG tablet Take 500 mg by mouth 2 (two) times daily.   naproxen sodium (ALEVE) 220 MG tablet Take 220 mg by mouth 2 (two) times daily as needed.   nitrofurantoin (MACRODANTIN) 100 MG capsule Take 100 mg by mouth every morning.   omeprazole (PRILOSEC) 20 MG capsule Take 20 mg by mouth daily.    ONE TOUCH ULTRA  TEST test strip    pregabalin (LYRICA) 150 MG capsule Take 150 mg by mouth 3 (three) times  daily.   rosuvastatin (CRESTOR) 40 MG tablet Take 40 mg by mouth daily.   sitaGLIPtin (JANUVIA) 100 MG tablet Take 100 mg by mouth daily.   tiZANidine (ZANAFLEX) 4 MG tablet Take 4 mg by mouth daily. 78m at bedtime and 2 mg during the day   vitamin B-12 (CYANOCOBALAMIN) 100 MCG tablet Take 100 mcg by mouth daily.   oxybutynin (DITROPAN) 5 MG tablet TAKE ONE TABLET BY MOUTH EVERYDAY AT BEDTIME FOR FOR BLADDER control (Patient not taking: Reported on 10/22/2020)   No facility-administered encounter medications on file as of 10/22/2020.    ALLERGIES: Allergies  Allergen Reactions   Mold Extract  [Trichophyton] Shortness Of Breath    wheezing   Other Anaphylaxis, Shortness Of Breath and Other (See Comments)     : Angioedema wheezing Wheezing wheezing Other reaction(s): Cough Wheezing Itching and rash Wheezing wheezing   Sulfa Antibiotics Anaphylaxis     : Angioedema    Sulfonamide Derivatives Anaphylaxis     : Angioedema   Gluten Meal Itching   Molds & Smuts    Clonazepam Other (See Comments)    Confusion and memory loss Caused nconfusion   Dust Mite Extract    Tegretol [Carbamazepine] Rash    VACCINATION STATUS: Immunization History  Administered Date(s) Administered   Influenza Whole 10/03/2007   Influenza-Unspecified 09/18/2014, 09/23/2015   Pneumococcal Polysaccharide-23 10/14/2012    Diabetes She presents for her initial diabetic visit. She has type 2 diabetes mellitus. Onset time: She was diagnosed at approximate age of 89 Her disease course has been improving. There are no hypoglycemic associated symptoms. Pertinent negatives for diabetes include no blurred vision, no fatigue, no polydipsia, no polyphagia and no polyuria. There are no hypoglycemic complications. Symptoms are improving. Diabetic complications include nephropathy and retinopathy. Risk factors for coronary artery disease include diabetes mellitus, dyslipidemia, obesity and sedentary  lifestyle. Current diabetic treatment includes insulin injections and oral agent (dual therapy). She is compliant with treatment most of the time. Her weight is decreasing steadily. She is following a generally healthy diet. Meal planning includes avoidance of concentrated sweets. She has not had a previous visit with a dietitian. She participates in exercise intermittently. Her home blood glucose trend is decreasing steadily. Her breakfast blood glucose range is generally 70-90 mg/dl. Her lunch blood glucose range is generally 90-110 mg/dl. Her dinner blood glucose range is generally 110-130 mg/dl. Her bedtime blood glucose range is generally 110-130 mg/dl. Her overall blood glucose range is 90-110 mg/dl. (She presents today, accompanied by her husband, with her meter and logs showing greatly improved glycemic profile overall.  She does have tightening fasting numbers in the 70s.  She has drastically changed her lifestyle since last visit.  Her POCT A1c today is 7.9%, increasing from when checked last at PCP of 6.9%.) An ACE inhibitor/angiotensin II receptor blocker is not being taken. She does not see a podiatrist.Eye exam is current.  Hyperlipidemia This is a chronic problem. The current episode started more than 1 year ago. The problem is controlled. Recent lipid tests were reviewed and are normal. Exacerbating diseases include chronic renal disease, diabetes and obesity. There are no known factors aggravating her hyperlipidemia. Current antihyperlipidemic treatment includes statins. The current treatment provides moderate improvement of lipids. There are no compliance problems.  Risk factors for coronary artery disease include diabetes mellitus, dyslipidemia, obesity and a sedentary lifestyle.  Review of systems  Constitutional: + steadily decreasing body weight,  current Body mass index is 32.36 kg/m. , no fatigue, no subjective hyperthermia, no subjective hypothermia Eyes: no blurry vision, no  xerophthalmia ENT: c/o dry throat (PCP recently discontinued Oxybutynin), + hoarseness Cardiovascular: no chest pain, no shortness of breath, no palpitations, no leg swelling Respiratory: no cough, no shortness of breath Gastrointestinal: no nausea/vomiting/diarrhea Musculoskeletal: no muscle/joint aches Skin: no rashes, no hyperemia Neurological: no tremors, no numbness, no tingling, no dizziness Psychiatric: no depression, no anxiety   Objective:    BP (!) 153/82 (BP Location: Left Arm)    Pulse 71    Wt 232 lb (105.2 kg)    BMI 32.36 kg/m   Wt Readings from Last 3 Encounters:  10/22/20 232 lb (105.2 kg)  10/15/20 235 lb (106.6 kg)  08/29/19 231 lb (104.8 kg)    BP Readings from Last 3 Encounters:  10/22/20 (!) 153/82  10/15/20 (!) 144/85  01/20/20 111/75    Physical Exam- Limited  Constitutional:  Body mass index is 32.36 kg/m. , not in acute distress, normal state of mind Eyes:  EOMI, no exophthalmos Respiratory: Adequate breathing efforts Musculoskeletal: no gross deformities, strength intact in all four extremities, no gross restriction of joint movements Skin:  no rashes, no hyperemia Neurological: no tremor with outstretched hands   Foot exam:   No rashes, ulcers, cuts, calluses, onychodystrophy.   Good pulses bilat.  Good sensation to 10 g monofilament bilat.   CMP ( most recent) CMP     Component Value Date/Time   NA 140 07/03/2019 1230   K 5.3 09/14/2020 0000   CL 104 07/03/2019 1230   CO2 28 07/03/2019 1230   GLUCOSE 119 (H) 07/03/2019 1230   BUN 12 07/03/2019 1230   CREATININE 1.00 07/03/2019 1230   CREATININE 0.88 02/21/2012 1153   CALCIUM 10.3 09/14/2020 0000   PROT 7.0 07/03/2019 1230   ALBUMIN 4.6 07/03/2019 1230   AST 54 (H) 07/03/2019 1230   ALT 39 07/03/2019 1230   ALKPHOS 49 07/03/2019 1230   BILITOT 0.9 07/03/2019 1230   GFRNONAA 46 09/14/2020 0000   GFRAA >60 07/03/2019 1230     Diabetic Labs (most recent): Lab Results   Component Value Date   HGBA1C 7.9 (A) 10/22/2020   HGBA1C 6.2 (H) 03/18/2015     Lipid Panel ( most recent) Lipid Panel     Component Value Date/Time   CHOL 171 08/26/2020 0000   TRIG 147 08/26/2020 0000   HDL 60 03/18/2015 0915   CHOLHDL 4.2 03/18/2015 0915   VLDL 44 (H) 03/18/2015 0915   LDLCALC 89 08/26/2020 0000      Lab Results  Component Value Date   TSH 1.66 09/14/2020   TSH 2.770 08/29/2019   TSH 3.261 10/14/2012   TSH 2.651 02/21/2012   TSH 1.981 03/06/2009   TSH 3.250 01/23/2008           Assessment & Plan:   1) Type 2 diabetes mellitus with hyperglycemia, with long-term current use of insulin (HCC)  - Michelle Clements has currently uncontrolled symptomatic type 2 DM since 54 years of age, with most recent A1c of 6.9 % (unsure of date).  She presents today, accompanied by her husband, with her meter and logs showing greatly improved glycemic profile overall.  She does have tightening fasting numbers in the 70s.  She has drastically changed her lifestyle since last visit.  Her POCT A1c today is 7.9%, increasing from when  checked last at PCP of 6.9%.   Recent labs reviewed, showing CKD stage 3.  Her POCT urine microalbumin was abnormal at 150 (will monitor).  - I had a long discussion with her about the progressive nature of diabetes and the pathology behind its complications. -her diabetes is complicated by sleep apnea, HLD, retinopathy and she remains at a high risk for more acute and chronic complications which include CAD, CVA, CKD, retinopathy, and neuropathy. These are all discussed in detail with her.  - Nutritional counseling repeated at each appointment to reinforce education.   - The patient admits there is a room for improvement in their diet and drink choices. -  Suggestion is made for the patient to avoid simple carbohydrates from their diet including Cakes, Sweet Desserts / Pastries, Ice Cream, Soda (diet and regular), Sweet Tea, Candies, Chips,  Cookies, Sweet Pastries,  Store Bought Juices, Alcohol in Excess of  1-2 drinks a day, Artificial Sweeteners, Coffee Creamer, and "Sugar-free" Products. This will help patient to have stable blood glucose profile and potentially avoid unintended weight gain.   - I encouraged the patient to switch to  unprocessed or minimally processed complex starch and increased protein intake (animal or plant source), fruits, and vegetables.   - Patient is advised to stick to a routine mealtimes to eat 3 meals  a day and avoid unnecessary snacks ( to snack only to correct hypoglycemia).  - she will be scheduled with Jearld Fenton, RDN, CDE for diabetes education.  - I have approached her with the following individualized plan to manage  her diabetes and patient agrees:   -Given her tightening fasting glycemic profile, she will tolerate decrease in her Lantus to 65 units SQ nightly.  She is advised to continue Januvia 100 mg po daily and continue Metformin 500 mg po twice daily with meals.  -She is encouraged to continue monitoring her blood glucose 4 times daily, before meals and before bed, and call the clinic if she has readings less than 70 or greater than 200 for 3 tests in a row.  - Specific targets for  A1c;  LDL, HDL,  and Triglycerides were discussed with the patient.  2) Blood Pressure /Hypertension:   her blood pressure is not controlled to target.  She is not on any antihypertensive medications.  She will be considered for low dose ACE/ARB if BP is above 140/90 for 3 separate visits.  3) Lipids/Hyperlipidemia:    Review of her recent lipid panel from 08/26/20 showed controlled  LDL at 89 .  she  is advised to continue    Crestor 40 mg daily at bedtime.  Side effects and precautions discussed with her.  4)  Weight/Diet:  Her Body mass index is 32.36 kg/m.  -  clearly complicating her diabetes care.   she is a candidate for weight loss. I discussed with her the fact that loss of 5 - 10% of her   current body weight will have the most impact on her diabetes management.  Exercise, and detailed carbohydrates information provided  -  detailed on discharge instructions.  5) Chronic Care/Health Maintenance: -she is on Statin medications and is encouraged to initiate and continue to follow up with Ophthalmology, Dentist,  Podiatrist at least yearly or according to recommendations, and advised to stay away from smoking. I have recommended yearly flu vaccine and pneumonia vaccine at least every 5 years; moderate intensity exercise for up to 150 minutes weekly; and  sleep for at least 7  hours a day.  - she is  advised to maintain close follow up with [name], MD for primary care needs, as well as her other providers for optimal and coordinated care.   - Time spent on this patient care encounter:  35 min, of which > 50% was spent in  counseling and the rest reviewing her blood glucose logs , discussing her hypoglycemia and hyperglycemia episodes, reviewing her current and  previous labs / studies  ( including abstraction from other facilities) and medications  doses and developing a  long term treatment plan and documenting her care.   Please refer to Patient Instructions for Blood Glucose Monitoring and Insulin/Medications Dosing Guide"  in media tab for additional information. Please  also refer to " Patient Self Inventory" in the Media  tab for reviewed elements of pertinent patient history.  Michelle Clements participated in the discussions, expressed understanding, and voiced agreement with the above plans.  All questions were answered to her satisfaction. she is encouraged to contact clinic should she have any questions or concerns prior to her return visit.   Follow up plan: - Return in about 3 months (around 01/22/2021) for Diabetes follow up with A1c in office, ABI next visit, Previsit labs.  Rayetta Pigg, [facility] [address] [place], [place]  [address] Phone: [phone] Fax: [phone]  10/22/2020, 12:01 PM

## 2020-10-26 ENCOUNTER — Other Ambulatory Visit (HOSPITAL_COMMUNITY): Payer: Self-pay | Admitting: Psychiatry

## 2020-10-26 ENCOUNTER — Telehealth (HOSPITAL_COMMUNITY): Payer: Self-pay | Admitting: *Deleted

## 2020-10-26 MED ORDER — TEMAZEPAM 15 MG PO CAPS: 15.0000 mg | ORAL_CAPSULE | Freq: Every evening | ORAL | 0 refills | Status: DC | PRN

## 2020-10-26 NOTE — Telephone Encounter (Signed)
Patient called and [facility] stating that she is having troubles sleeping. Per pt she would like for provider to please prescribe her with something to help her sleep.

## 2020-10-26 NOTE — Telephone Encounter (Signed)
Informed patient with what provider stated and patient verbalized understanding and agreed

## 2020-10-26 NOTE — Telephone Encounter (Signed)
Temazepam 15 mg sent in

## 2020-10-27 DIAGNOSIS — N952 Postmenopausal atrophic vaginitis: Secondary | ICD-10-CM | POA: Diagnosis not present

## 2020-11-02 ENCOUNTER — Telehealth (HOSPITAL_COMMUNITY): Payer: Self-pay | Admitting: *Deleted

## 2020-11-02 ENCOUNTER — Other Ambulatory Visit (HOSPITAL_COMMUNITY): Payer: Self-pay | Admitting: Psychiatry

## 2020-11-02 MED ORDER — TRAZODONE HCL 50 MG PO TABS: 50.0000 mg | ORAL_TABLET | Freq: Every day | ORAL | 2 refills | Status: DC

## 2020-11-02 NOTE — Telephone Encounter (Signed)
Patient called stating that the Temazepam is not working. Per pt she is still not able to sleep. Per pt she is having vivid dreams and hallucinations. Per pt she would like to know what else can provider put her on for sleep.

## 2020-11-02 NOTE — Telephone Encounter (Signed)
Informed patient with information and she verbalized understanding and agreed with SIG for new sleeping script

## 2020-11-02 NOTE — Telephone Encounter (Signed)
Trazodone 50 mg sent in

## 2020-11-03 ENCOUNTER — Encounter: Payer: Self-pay | Admitting: Nurse Practitioner

## 2020-11-03 DIAGNOSIS — M545 Low back pain, unspecified: Secondary | ICD-10-CM | POA: Diagnosis not present

## 2020-11-03 DIAGNOSIS — Z79891 Long term (current) use of opiate analgesic: Secondary | ICD-10-CM | POA: Diagnosis not present

## 2020-11-03 DIAGNOSIS — M797 Fibromyalgia: Secondary | ICD-10-CM | POA: Diagnosis not present

## 2020-11-03 DIAGNOSIS — M5412 Radiculopathy, cervical region: Secondary | ICD-10-CM | POA: Diagnosis not present

## 2020-11-03 DIAGNOSIS — M542 Cervicalgia: Secondary | ICD-10-CM | POA: Diagnosis not present

## 2020-11-11 DIAGNOSIS — M533 Sacrococcygeal disorders, not elsewhere classified: Secondary | ICD-10-CM | POA: Diagnosis not present

## 2020-11-17 DIAGNOSIS — G5620 Lesion of ulnar nerve, unspecified upper limb: Secondary | ICD-10-CM | POA: Diagnosis not present

## 2020-11-17 DIAGNOSIS — M797 Fibromyalgia: Secondary | ICD-10-CM | POA: Diagnosis not present

## 2020-11-17 DIAGNOSIS — M545 Low back pain, unspecified: Secondary | ICD-10-CM | POA: Diagnosis not present

## 2020-11-17 DIAGNOSIS — Z79891 Long term (current) use of opiate analgesic: Secondary | ICD-10-CM | POA: Diagnosis not present

## 2020-11-17 DIAGNOSIS — M5412 Radiculopathy, cervical region: Secondary | ICD-10-CM | POA: Diagnosis not present

## 2020-11-17 DIAGNOSIS — G5603 Carpal tunnel syndrome, bilateral upper limbs: Secondary | ICD-10-CM | POA: Diagnosis not present

## 2020-11-17 DIAGNOSIS — M542 Cervicalgia: Secondary | ICD-10-CM | POA: Diagnosis not present

## 2020-11-18 ENCOUNTER — Encounter: Payer: Self-pay | Admitting: Nutrition

## 2020-11-18 ENCOUNTER — Other Ambulatory Visit: Payer: Self-pay

## 2020-11-18 ENCOUNTER — Encounter: Payer: PPO | Attending: Nurse Practitioner | Admitting: Nutrition

## 2020-11-18 VITALS — Ht 71.0 in | Wt 231.0 lb

## 2020-11-18 DIAGNOSIS — E782 Mixed hyperlipidemia: Secondary | ICD-10-CM

## 2020-11-18 DIAGNOSIS — Z794 Long term (current) use of insulin: Secondary | ICD-10-CM | POA: Insufficient documentation

## 2020-11-18 DIAGNOSIS — N183 Chronic kidney disease, stage 3 unspecified: Secondary | ICD-10-CM

## 2020-11-18 DIAGNOSIS — E1165 Type 2 diabetes mellitus with hyperglycemia: Secondary | ICD-10-CM | POA: Insufficient documentation

## 2020-11-18 DIAGNOSIS — I10 Essential (primary) hypertension: Secondary | ICD-10-CM

## 2020-11-18 NOTE — Patient Instructions (Signed)
Goals  Follow My Plate Eat 79-21 g CHO at meals Count carbs Read labels. Cut out processed foods Increase fresh fruits and vegetables. Get A1C down to 7%

## 2020-11-18 NOTE — Progress Notes (Signed)
Medical Nutrition Therapy   Primary concerns today: Uncontrolled blood sugars Referral diagnosis: DM Type 2, e11.65 x 10 yrs. Preferred learning style:  no preference indicated Learning readiness:  change in progress   NUTRITION ASSESSMENT  FBS 80-10''s Before lunch and dinners < 150's, bedtime < 150's. She is doing excellent. BS are WNL. She is working on eating meals on time. Taking medications as prescribed. Anthropometrics  Wt Readings from Last 3 Encounters:  11/18/20 231 lb (104.8 kg)  10/22/20 232 lb (105.2 kg)  10/15/20 235 lb (106.6 kg)   Ht Readings from Last 3 Encounters:  11/18/20 5\' 11"  (1.803 m)  10/15/20 5\' 11"  (1.803 m)  08/29/19 5\' 11"  (1.803 m)   Body mass index is 32.22 kg/m. @BMIFA @ Facility age limit for growth percentiles is 20 years. Facility age limit for growth percentiles is 20 years. '   Clinical Medical Hx: Bipolar, Fibromyalgia, Hyperlipidemia, Obesity, Type 2 DM Medications: Januvia 100 mg/d Metformin 500 mg BIDand Lantus 65 units. Labs:  Lab Results  Component Value Date   HGBA1C 7.9 (A) 10/22/2020    Notable Signs/Symptoms:  Steriod injection  Lifestyle & Dietary Hx Lab Results  Component Value Date   HGBA1C 7.9 (A) 10/22/2020   CMP Latest Ref Rng & Units 09/14/2020 08/26/2020 07/03/2019  Glucose 70 - 99 mg/dL - - 119(H)  BUN 6 - 20 mg/dL - - 12  Creatinine 0.44 - 1.00 mg/dL - - 1.00  Sodium 135 - 145 mmol/L - - 140  Potassium 3.4 - 5.3 5.3 - 4.7  Chloride 98 - 111 mmol/L - - 104  CO2 22 - 32 mmol/L - - 28  Calcium 8.7 - 10.7 10.3 10.5 10.3  Total Protein 6.5 - 8.1 g/dL - - 7.0  Total Bilirubin 0.3 - 1.2 mg/dL - - 0.9  Alkaline Phos 38 - 126 U/L - - 49  AST 15 - 41 U/L - - 54(H)  ALT 0 - 44 U/L - - 39   Lipid Panel     Component Value Date/Time   CHOL 171 08/26/2020 0000   TRIG 147 08/26/2020 0000   HDL 60 03/18/2015 0915   CHOLHDL 4.2 03/18/2015 0915   VLDL 44 (H) 03/18/2015 0915   LDLCALC 89 08/26/2020 0000      Estimated daily fluid intake: 64 oz Supplements:  Sleep: a lot Stress / self-care:  Current average weekly physical activity: limited due to join pain  24-Hr Dietary Recall First Meal:Plain oatmeal with almonds and honey, blueberries, coffee with milk Lunch: Deli meat, cheese stick, fruit, water , salad Dinner: Rotisserie chicken, broccoli, sweet potato, water Estimated Energy Needs Calories: 1500  Carbohydrate: 170g Protein: 112g Fat: 42g   NUTRITION DIAGNOSIS  NB-1.1 Food and nutrition-related knowledge deficit As related to Diabetes Type 2.  As evidenced by A1C 7.9%.   NUTRITION INTERVENTION  Nutrition education (E-1) on the following topics:  . DM Type 2 . Hyper/hypoglycemia . A1C goals . Meal Planning, . Portion control . Prevention of complications  . Carb counting   Handouts Provided Include   My Plate  Meal Plan Card  Diabetes Instructions   Learning Style & Readiness for Change Teaching method utilized: Visual & Auditory  Demonstrated degree of understanding via: Teach Back  Barriers to learning/adherence to lifestyle change: joint pain, pain meds.  Goals Established by Pt Goals  Follow My Plate Eat 35-00 g CHO at meals Count carbs Read labels. Cut out processed foods Increase fresh fruits and vegetables. Get A1C down  to 7%.   MONITORING & EVALUATION Dietary intake, weekly physical activity, f/u in 2 months

## 2020-11-23 DIAGNOSIS — G4733 Obstructive sleep apnea (adult) (pediatric): Secondary | ICD-10-CM | POA: Diagnosis not present

## 2020-11-23 DIAGNOSIS — E6609 Other obesity due to excess calories: Secondary | ICD-10-CM | POA: Diagnosis not present

## 2020-11-23 DIAGNOSIS — E119 Type 2 diabetes mellitus without complications: Secondary | ICD-10-CM | POA: Diagnosis not present

## 2020-11-23 DIAGNOSIS — R5382 Chronic fatigue, unspecified: Secondary | ICD-10-CM | POA: Diagnosis not present

## 2020-11-23 DIAGNOSIS — G894 Chronic pain syndrome: Secondary | ICD-10-CM | POA: Diagnosis not present

## 2020-11-23 DIAGNOSIS — J45909 Unspecified asthma, uncomplicated: Secondary | ICD-10-CM | POA: Diagnosis not present

## 2020-11-23 DIAGNOSIS — G589 Mononeuropathy, unspecified: Secondary | ICD-10-CM | POA: Diagnosis not present

## 2020-11-23 DIAGNOSIS — R945 Abnormal results of liver function studies: Secondary | ICD-10-CM | POA: Diagnosis not present

## 2020-11-23 DIAGNOSIS — M4800 Spinal stenosis, site unspecified: Secondary | ICD-10-CM | POA: Diagnosis not present

## 2020-11-23 DIAGNOSIS — D509 Iron deficiency anemia, unspecified: Secondary | ICD-10-CM | POA: Diagnosis not present

## 2020-11-23 DIAGNOSIS — D839 Common variable immunodeficiency, unspecified: Secondary | ICD-10-CM | POA: Diagnosis not present

## 2020-11-23 DIAGNOSIS — F339 Major depressive disorder, recurrent, unspecified: Secondary | ICD-10-CM | POA: Diagnosis not present

## 2020-11-23 DIAGNOSIS — N390 Urinary tract infection, site not specified: Secondary | ICD-10-CM | POA: Diagnosis not present

## 2020-12-04 DIAGNOSIS — I10 Essential (primary) hypertension: Secondary | ICD-10-CM | POA: Diagnosis not present

## 2020-12-04 DIAGNOSIS — M797 Fibromyalgia: Secondary | ICD-10-CM | POA: Diagnosis not present

## 2020-12-04 DIAGNOSIS — E782 Mixed hyperlipidemia: Secondary | ICD-10-CM | POA: Diagnosis not present

## 2020-12-04 DIAGNOSIS — Z23 Encounter for immunization: Secondary | ICD-10-CM | POA: Diagnosis not present

## 2020-12-08 DIAGNOSIS — M797 Fibromyalgia: Secondary | ICD-10-CM | POA: Diagnosis not present

## 2020-12-08 DIAGNOSIS — M792 Neuralgia and neuritis, unspecified: Secondary | ICD-10-CM | POA: Diagnosis not present

## 2020-12-08 DIAGNOSIS — M47812 Spondylosis without myelopathy or radiculopathy, cervical region: Secondary | ICD-10-CM | POA: Diagnosis not present

## 2020-12-08 DIAGNOSIS — G8929 Other chronic pain: Secondary | ICD-10-CM | POA: Diagnosis not present

## 2020-12-08 DIAGNOSIS — M4781 Spondylosis without myelopathy or radiculopathy: Secondary | ICD-10-CM | POA: Diagnosis not present

## 2020-12-09 DIAGNOSIS — M4602 Spinal enthesopathy, cervical region: Secondary | ICD-10-CM | POA: Diagnosis not present

## 2020-12-09 DIAGNOSIS — M5030 Other cervical disc degeneration, unspecified cervical region: Secondary | ICD-10-CM | POA: Diagnosis not present

## 2020-12-09 DIAGNOSIS — G629 Polyneuropathy, unspecified: Secondary | ICD-10-CM | POA: Diagnosis not present

## 2020-12-09 DIAGNOSIS — M5032 Other cervical disc degeneration, mid-cervical region, unspecified level: Secondary | ICD-10-CM | POA: Diagnosis not present

## 2020-12-10 ENCOUNTER — Encounter: Payer: Self-pay | Admitting: Nurse Practitioner

## 2020-12-14 DIAGNOSIS — G4733 Obstructive sleep apnea (adult) (pediatric): Secondary | ICD-10-CM | POA: Diagnosis not present

## 2020-12-14 DIAGNOSIS — M545 Low back pain, unspecified: Secondary | ICD-10-CM | POA: Diagnosis not present

## 2020-12-14 DIAGNOSIS — M542 Cervicalgia: Secondary | ICD-10-CM | POA: Diagnosis not present

## 2020-12-14 DIAGNOSIS — M797 Fibromyalgia: Secondary | ICD-10-CM | POA: Diagnosis not present

## 2020-12-14 DIAGNOSIS — Z79891 Long term (current) use of opiate analgesic: Secondary | ICD-10-CM | POA: Diagnosis not present

## 2020-12-14 DIAGNOSIS — M5412 Radiculopathy, cervical region: Secondary | ICD-10-CM | POA: Diagnosis not present

## 2020-12-28 DIAGNOSIS — S134XXA Sprain of ligaments of cervical spine, initial encounter: Secondary | ICD-10-CM | POA: Diagnosis not present

## 2020-12-28 DIAGNOSIS — S338XXA Sprain of other parts of lumbar spine and pelvis, initial encounter: Secondary | ICD-10-CM | POA: Diagnosis not present

## 2020-12-28 DIAGNOSIS — S233XXA Sprain of ligaments of thoracic spine, initial encounter: Secondary | ICD-10-CM | POA: Diagnosis not present

## 2020-12-29 DIAGNOSIS — G894 Chronic pain syndrome: Secondary | ICD-10-CM | POA: Diagnosis not present

## 2020-12-29 DIAGNOSIS — M4800 Spinal stenosis, site unspecified: Secondary | ICD-10-CM | POA: Diagnosis not present

## 2020-12-29 DIAGNOSIS — D839 Common variable immunodeficiency, unspecified: Secondary | ICD-10-CM | POA: Diagnosis not present

## 2020-12-29 DIAGNOSIS — R945 Abnormal results of liver function studies: Secondary | ICD-10-CM | POA: Diagnosis not present

## 2020-12-29 DIAGNOSIS — G589 Mononeuropathy, unspecified: Secondary | ICD-10-CM | POA: Diagnosis not present

## 2020-12-29 DIAGNOSIS — E6609 Other obesity due to excess calories: Secondary | ICD-10-CM | POA: Diagnosis not present

## 2020-12-29 DIAGNOSIS — F339 Major depressive disorder, recurrent, unspecified: Secondary | ICD-10-CM | POA: Diagnosis not present

## 2020-12-29 DIAGNOSIS — E119 Type 2 diabetes mellitus without complications: Secondary | ICD-10-CM | POA: Diagnosis not present

## 2020-12-29 DIAGNOSIS — G4733 Obstructive sleep apnea (adult) (pediatric): Secondary | ICD-10-CM | POA: Diagnosis not present

## 2020-12-29 DIAGNOSIS — D509 Iron deficiency anemia, unspecified: Secondary | ICD-10-CM | POA: Diagnosis not present

## 2020-12-29 DIAGNOSIS — R5382 Chronic fatigue, unspecified: Secondary | ICD-10-CM | POA: Diagnosis not present

## 2020-12-29 DIAGNOSIS — J45909 Unspecified asthma, uncomplicated: Secondary | ICD-10-CM | POA: Diagnosis not present

## 2020-12-29 LAB — HEPATIC FUNCTION PANEL
ALT: 26 (ref 7–35)
AST: 23 (ref 13–35)
Alkaline Phosphatase: 61 (ref 25–125)
Bilirubin, Total: 0.4

## 2020-12-29 LAB — BASIC METABOLIC PANEL
BUN: 19 (ref 4–21)
CO2: 27 — AB (ref 13–22)
Chloride: 102 (ref 99–108)
Creatinine: 1.4 — AB (ref 0.5–1.1)
Glucose: 67
Potassium: 4.2 (ref 3.4–5.3)
Sodium: 141 (ref 137–147)

## 2020-12-29 LAB — COMPREHENSIVE METABOLIC PANEL
Albumin: 4.8 (ref 3.5–5.0)
Calcium: 10.2 (ref 8.7–10.7)
GFR calc Af Amer: 50
GFR calc non Af Amer: 44
Globulin: 1.8

## 2020-12-29 LAB — HEMOGLOBIN A1C: Hemoglobin A1C: 5.4

## 2020-12-29 LAB — TSH: TSH: 2.69 (ref 0.41–5.90)

## 2020-12-30 DIAGNOSIS — S233XXA Sprain of ligaments of thoracic spine, initial encounter: Secondary | ICD-10-CM | POA: Diagnosis not present

## 2020-12-30 DIAGNOSIS — S134XXA Sprain of ligaments of cervical spine, initial encounter: Secondary | ICD-10-CM | POA: Diagnosis not present

## 2020-12-30 DIAGNOSIS — S338XXA Sprain of other parts of lumbar spine and pelvis, initial encounter: Secondary | ICD-10-CM | POA: Diagnosis not present

## 2020-12-30 LAB — LIPID PANEL
Cholesterol: 134 (ref 0–200)
HDL: 39 (ref 35–70)
LDL Cholesterol: 64
Triglycerides: 185 — AB (ref 40–160)

## 2020-12-31 DIAGNOSIS — R945 Abnormal results of liver function studies: Secondary | ICD-10-CM | POA: Diagnosis not present

## 2020-12-31 DIAGNOSIS — D509 Iron deficiency anemia, unspecified: Secondary | ICD-10-CM | POA: Diagnosis not present

## 2020-12-31 DIAGNOSIS — F339 Major depressive disorder, recurrent, unspecified: Secondary | ICD-10-CM | POA: Diagnosis not present

## 2020-12-31 DIAGNOSIS — D839 Common variable immunodeficiency, unspecified: Secondary | ICD-10-CM | POA: Diagnosis not present

## 2020-12-31 DIAGNOSIS — G589 Mononeuropathy, unspecified: Secondary | ICD-10-CM | POA: Diagnosis not present

## 2020-12-31 DIAGNOSIS — G4733 Obstructive sleep apnea (adult) (pediatric): Secondary | ICD-10-CM | POA: Diagnosis not present

## 2020-12-31 DIAGNOSIS — G894 Chronic pain syndrome: Secondary | ICD-10-CM | POA: Diagnosis not present

## 2020-12-31 DIAGNOSIS — E6609 Other obesity due to excess calories: Secondary | ICD-10-CM | POA: Diagnosis not present

## 2020-12-31 DIAGNOSIS — M4800 Spinal stenosis, site unspecified: Secondary | ICD-10-CM | POA: Diagnosis not present

## 2020-12-31 DIAGNOSIS — E119 Type 2 diabetes mellitus without complications: Secondary | ICD-10-CM | POA: Diagnosis not present

## 2020-12-31 DIAGNOSIS — R5382 Chronic fatigue, unspecified: Secondary | ICD-10-CM | POA: Diagnosis not present

## 2020-12-31 DIAGNOSIS — J45909 Unspecified asthma, uncomplicated: Secondary | ICD-10-CM | POA: Diagnosis not present

## 2021-01-05 DIAGNOSIS — S338XXA Sprain of other parts of lumbar spine and pelvis, initial encounter: Secondary | ICD-10-CM | POA: Diagnosis not present

## 2021-01-05 DIAGNOSIS — S233XXA Sprain of ligaments of thoracic spine, initial encounter: Secondary | ICD-10-CM | POA: Diagnosis not present

## 2021-01-05 DIAGNOSIS — S134XXA Sprain of ligaments of cervical spine, initial encounter: Secondary | ICD-10-CM | POA: Diagnosis not present

## 2021-01-07 DIAGNOSIS — S233XXA Sprain of ligaments of thoracic spine, initial encounter: Secondary | ICD-10-CM | POA: Diagnosis not present

## 2021-01-07 DIAGNOSIS — S134XXA Sprain of ligaments of cervical spine, initial encounter: Secondary | ICD-10-CM | POA: Diagnosis not present

## 2021-01-07 DIAGNOSIS — S338XXA Sprain of other parts of lumbar spine and pelvis, initial encounter: Secondary | ICD-10-CM | POA: Diagnosis not present

## 2021-01-11 DIAGNOSIS — S338XXA Sprain of other parts of lumbar spine and pelvis, initial encounter: Secondary | ICD-10-CM | POA: Diagnosis not present

## 2021-01-11 DIAGNOSIS — S134XXA Sprain of ligaments of cervical spine, initial encounter: Secondary | ICD-10-CM | POA: Diagnosis not present

## 2021-01-11 DIAGNOSIS — S233XXA Sprain of ligaments of thoracic spine, initial encounter: Secondary | ICD-10-CM | POA: Diagnosis not present

## 2021-01-18 ENCOUNTER — Other Ambulatory Visit: Payer: Self-pay

## 2021-01-18 ENCOUNTER — Encounter (HOSPITAL_COMMUNITY): Payer: Self-pay | Admitting: Psychiatry

## 2021-01-18 ENCOUNTER — Telehealth (INDEPENDENT_AMBULATORY_CARE_PROVIDER_SITE_OTHER): Payer: HMO | Admitting: Psychiatry

## 2021-01-18 ENCOUNTER — Ambulatory Visit: Payer: PPO | Admitting: Nutrition

## 2021-01-18 DIAGNOSIS — S134XXA Sprain of ligaments of cervical spine, initial encounter: Secondary | ICD-10-CM | POA: Diagnosis not present

## 2021-01-18 DIAGNOSIS — S338XXA Sprain of other parts of lumbar spine and pelvis, initial encounter: Secondary | ICD-10-CM | POA: Diagnosis not present

## 2021-01-18 DIAGNOSIS — F332 Major depressive disorder, recurrent severe without psychotic features: Secondary | ICD-10-CM

## 2021-01-18 DIAGNOSIS — S233XXA Sprain of ligaments of thoracic spine, initial encounter: Secondary | ICD-10-CM | POA: Diagnosis not present

## 2021-01-18 MED ORDER — INGREZZA 80 MG PO CAPS: 80.0000 mg | ORAL_CAPSULE | Freq: Every day | ORAL | 2 refills | Status: DC

## 2021-01-18 MED ORDER — FLUOXETINE HCL 40 MG PO CAPS: 40.0000 mg | ORAL_CAPSULE | Freq: Every day | ORAL | 2 refills | Status: DC

## 2021-01-18 MED ORDER — TRAZODONE HCL 50 MG PO TABS: 50.0000 mg | ORAL_TABLET | Freq: Every day | ORAL | 2 refills | Status: DC

## 2021-01-18 MED ORDER — CLONAZEPAM 0.5 MG PO TABS: 0.5000 mg | ORAL_TABLET | Freq: Two times a day (BID) | ORAL | 2 refills | Status: DC | PRN
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
## 2021-01-21 ENCOUNTER — Encounter: Payer: Self-pay | Admitting: Nurse Practitioner

## 2021-01-22 ENCOUNTER — Encounter: Payer: HMO | Attending: Nurse Practitioner | Admitting: Dietician

## 2021-01-22 ENCOUNTER — Encounter: Payer: Self-pay | Admitting: Nurse Practitioner

## 2021-01-22 ENCOUNTER — Other Ambulatory Visit: Payer: Self-pay

## 2021-01-22 ENCOUNTER — Ambulatory Visit (INDEPENDENT_AMBULATORY_CARE_PROVIDER_SITE_OTHER): Payer: HMO | Admitting: Nurse Practitioner

## 2021-01-22 VITALS — Ht 71.0 in

## 2021-01-22 VITALS — BP 124/74 | HR 71 | Ht 71.0 in | Wt 222.4 lb

## 2021-01-22 DIAGNOSIS — Z794 Long term (current) use of insulin: Secondary | ICD-10-CM

## 2021-01-22 DIAGNOSIS — E119 Type 2 diabetes mellitus without complications: Secondary | ICD-10-CM | POA: Insufficient documentation

## 2021-01-22 DIAGNOSIS — E1165 Type 2 diabetes mellitus with hyperglycemia: Secondary | ICD-10-CM

## 2021-01-22 NOTE — Patient Instructions (Signed)

## 2021-01-22 NOTE — Patient Instructions (Addendum)
Continue to recognize your carbohydrates at every meal, and choose the right amount of each card to eat 45-60g at each meal.  Work towards hitting your 45-60g of carbs at lunch.  Check your blood sugar fasting in the morning, and before bed.

## 2021-01-22 NOTE — Progress Notes (Signed)
Endocrinology Follow Up Visit       01/22/2021, 11:52 AM   Subjective:    Patient ID: Michelle Clements, female    DOB: 1966-11-29.  Michelle Clements is being seen in follow up after being seen in consultation for management of currently uncontrolled symptomatic diabetes requested by  [name], MD.   Past Medical History:  Diagnosis Date  . Allergic rhinitis   . Anemia   . Arthritis    Lower back and hips  . Asthma   . Bipolar disorder ([place])   . 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[place])   . Constipation   . 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 Depression   . Essential hypertension, benign   . Fibromyalgia   . GERD (gastroesophageal reflux disease)   . H/O sleep apnea   . Hip pain, left   . History of palpitations    Negative Holter monitor  . History of pneumonia 1988  . Hyperlipidemia   . IBS (irritable bowel syndrome)   . Neck pain   . Poor short term memory   . PTSD (post-traumatic stress disorder)   . S/P Botox injection 11/2014    For migraine headaches  . Type 2 diabetes mellitus ([place])   . Urgency of urination     Past Surgical History:  Procedure Laterality Date  . CARPAL TUNNEL RELEASE Right 06/10/2013   Procedure: CARPAL TUNNEL RELEASE;  Surgeon: Faythe Ghee, MD;  Location: [facility];  Service: Neurosurgery;  Laterality: Right;  Right Carpal Tunnel Release   . CHOLECYSTECTOMY    . DENTAL SURGERY  12/2014  . mole exc     x 3  . MUSCLE BIOPSY  2008   Right leg  . TUBAL LIGATION      Social History   Socioeconomic History  . Marital status: Married    Spouse name: Not on file  . Number of children: 2  . Years of education: College  . Highest education level: Not on file  Occupational History  . 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 Smoking status: Former Smoker    Types: Cigarettes     Quit date: 11/04/2010    Years since quitting: 10.2  . Smokeless tobacco: Never Used  Substance and Sexual Activity  . Alcohol use: Yes    Alcohol/week: 0.0 standard drinks    Comment: one mixed drink per week  . Drug use: No  . Sexual activity: Yes    Birth control/protection: Surgical  Other Topics Concern  . Not on file  Social History Narrative   Married   Lives with spouse and daughter   Right handed.   Caffeine use: 2 cups coffee in morning and 2 cups tea during the day    Social Determinants of Health   Financial Resource Strain: Not on file  Food Insecurity: Not on file  Transportation Needs: Not on file  Physical Activity: Not on file  Stress: Not on file  Social Connections: Not on file    Family History  Problem Relation Age of Onset  . Fibromyalgia  Mother   . Diabetes type II Mother   . Depression Mother   . Alzheimer's disease Mother   . GER disease Mother   . Heart disease Mother   . Paranoid behavior Mother   . Dementia Mother   . Heart attack Father 31       MI x5  . Stroke Father        CVA x7  . Asthma Father   . Brain cancer Father   . Seizures Father   . Anxiety disorder Sister   . OCD Sister   . Sexual abuse Sister   . Physical abuse Sister   . Anxiety disorder Sister   . ADD / ADHD Daughter   . ADD / ADHD Daughter   . Alcohol abuse Neg Hx   . Drug abuse Neg Hx   . Schizophrenia Neg Hx     Outpatient Encounter Medications as of 01/22/2021  Medication Sig  . albuterol (PROVENTIL HFA;VENTOLIN HFA) 108 (90 BASE) MCG/ACT inhaler Inhale 2 puffs into the lungs every 6 (six) hours as needed for wheezing or shortness of breath.  . Ascorbic Acid (VITAMIN C) 1000 MG tablet Take 1,000 mg by mouth daily.  . Blood Glucose Monitoring Suppl ([place]) w/Device KIT See admin instructions.  . Cholecalciferol (VITAMIN D) 50 MCG (2000 UT) tablet Take 1,000 Units by mouth daily.  . clonazePAM (KLONOPIN) 0.5 MG tablet Take 1 tablet (0.5  mg total) by mouth 2 (two) times daily as needed for anxiety.  Marland Kitchen estradiol (ESTRACE) 0.1 MG/GM vaginal cream Place vaginally.  Marland Kitchen FLUoxetine (PROZAC) 40 MG capsule Take 1 capsule (40 mg total) by mouth daily.  . Glucos-Chond-Hyal Ac-Ca Fructo (MOVE FREE JOINT HEALTH ADVANCE PO) Take by mouth.  Marland Kitchen HYDROcodone-acetaminophen (NORCO) 7.5-325 MG tablet 1 tablet every 4 (four) hours as needed.   Marland Kitchen ibuprofen (ADVIL) 800 MG tablet Take 800 mg by mouth 2 (two) times daily.  . insulin glargine (LANTUS SOLOSTAR) 100 UNIT/ML Solostar Pen Inject 55 Units into the skin daily.  . Lancets (ONETOUCH ULTRASOFT) lancets 1 each by Other route as needed.   Marland Kitchen levocetirizine (XYZAL) 5 MG tablet Take 5 mg by mouth at bedtime.  Marland Kitchen lubiprostone (AMITIZA) 24 MCG capsule TAKE ONE CAPSULE BY MOUTH EVERY MORNING and TAKE ONE CAPSULE BY MOUTH EVERY EVENING  . Melatonin 5 MG TABS Take 5 mg by mouth at bedtime as needed.   . metFORMIN (GLUCOPHAGE) 500 MG tablet Take 500 mg by mouth 2 (two) times daily.  . naproxen sodium (ALEVE) 220 MG tablet Take 220 mg by mouth 2 (two) times daily as needed.  Marland Kitchen omeprazole (PRILOSEC) 20 MG capsule Take 20 mg by mouth daily.  . rosuvastatin (CRESTOR) 40 MG tablet Take 40 mg by mouth daily.  . sennosides-docusate sodium (SENOKOT-S) 8.6-50 MG tablet Take 1 tablet by mouth daily.  . sitaGLIPtin (JANUVIA) 100 MG tablet Take 100 mg by mouth daily.  Marland Kitchen tiZANidine (ZANAFLEX) 4 MG tablet Take 4 mg by mouth daily. 66m at bedtime and 2 mg during the day  . traZODone (DESYREL) 50 MG tablet Take 1 tablet (50 mg total) by mouth at bedtime.  . vitamin B-12 (CYANOCOBALAMIN) 100 MCG tablet Take 100 mcg by mouth daily.  .Marland Kitchenzinc gluconate 50 MG tablet Take 50 mg by mouth daily.  . [DISCONTINUED] pregabalin (LYRICA) 150 MG capsule Take 200 mg by mouth 3 (three) times daily.  . pregabalin (LYRICA) 200 MG capsule Take 200 mg by mouth 3 (three)  times daily.  . [DISCONTINUED] fluticasone (FLONASE) 50 MCG/ACT nasal spray  Place 2 sprays into both nostrils daily.  . [DISCONTINUED] nitrofurantoin (MACRODANTIN) 100 MG capsule Take 100 mg by mouth every morning.  . [DISCONTINUED] ONE TOUCH ULTRA TEST test strip   . [DISCONTINUED] oxybutynin (DITROPAN) 5 MG tablet TAKE ONE TABLET BY MOUTH EVERYDAY AT BEDTIME FOR FOR BLADDER control (Patient not taking: No sig reported)  . [DISCONTINUED] Valbenazine Tosylate (INGREZZA) 80 MG CAPS Take 80 mg by mouth at bedtime.   No facility-administered encounter medications on file as of 01/22/2021.    ALLERGIES: Allergies  Allergen Reactions  . Mold Extract  [Trichophyton] Shortness Of Breath    wheezing  . Other Anaphylaxis, Shortness Of Breath and Other (See Comments)     : Angioedema wheezing Wheezing wheezing Other reaction(s): Cough Wheezing Itching and rash Wheezing wheezing  . Sulfa Antibiotics Anaphylaxis     : Angioedema   . Sulfonamide Derivatives Anaphylaxis     : Angioedema  . Gluten Meal Itching  . Molds & Smuts   . Clonazepam Other (See Comments)    Confusion and memory loss Caused nconfusion  . Dust Mite Extract   . Tegretol [Carbamazepine] Rash    VACCINATION STATUS: Immunization History  Administered Date(s) Administered  . Influenza Whole 10/03/2007  . Influenza-Unspecified 09/18/2014, 09/23/2015  . Pneumococcal Polysaccharide-23 10/14/2012    Diabetes She presents for her follow-up diabetic visit. She has type 2 diabetes mellitus. Onset time: She was diagnosed at approximate age of 69. Her disease course has been improving. Hypoglycemia symptoms include sweats and tremors. Pertinent negatives for diabetes include no blurred vision, no fatigue, no polydipsia, no polyphagia and no polyuria. There are no hypoglycemic complications. Symptoms are improving. Diabetic complications include nephropathy and retinopathy. Risk factors for coronary artery disease include diabetes mellitus, dyslipidemia, obesity and sedentary lifestyle. Current  diabetic treatment includes insulin injections and oral agent (dual therapy). She is compliant with treatment all of the time. Her weight is decreasing steadily. She is following a generally healthy diet. Meal planning includes avoidance of concentrated sweets. She has not had a previous visit with a dietitian. She participates in exercise intermittently. Her home blood glucose trend is decreasing steadily. Her breakfast blood glucose range is generally 70-90 mg/dl. Her lunch blood glucose range is generally 90-110 mg/dl. Her dinner blood glucose range is generally 90-110 mg/dl. Her bedtime blood glucose range is generally 90-110 mg/dl. Her overall blood glucose range is 90-110 mg/dl. (She presents today with her meter and logs, showing drastically improved glycemic profile with tight fasting readings.  Her previsit A1c was 5.4% on 12/29/20 improving from previous visit of 7.9%.  She has been meeting with the nutritionist routinely, has cut out sweetened foods/drinks.  She denies significant hypoglycemia.) An ACE inhibitor/angiotensin II receptor blocker is not being taken. She does not see a podiatrist.Eye exam is current.  Hyperlipidemia This is a chronic problem. The current episode started more than 1 year ago. The problem is controlled. Recent lipid tests were reviewed and are normal. Exacerbating diseases include chronic renal disease, diabetes and obesity. There are no known factors aggravating her hyperlipidemia. Current antihyperlipidemic treatment includes statins. The current treatment provides moderate improvement of lipids. There are no compliance problems.  Risk factors for coronary artery disease include diabetes mellitus, dyslipidemia, obesity and a sedentary lifestyle.    Review of systems  Constitutional: + steadily decreasing body weight,  current Body mass index is 31.02 kg/m. , no fatigue, no subjective hyperthermia, no  subjective hypothermia Eyes: no blurry vision, no  xerophthalmia ENT: c/o dry throat (PCP recently discontinued Oxybutynin), + hoarseness Cardiovascular: no chest pain, no shortness of breath, no palpitations, no leg swelling Respiratory: no cough, no shortness of breath Gastrointestinal: no nausea/vomiting/diarrhea Musculoskeletal: no muscle/joint aches, reports bilateral lower back pain (thinks it is her kidneys) Skin: no rashes, no hyperemia Neurological: no tremors, no numbness, no tingling, no dizziness Psychiatric: no depression, no anxiety   Objective:    BP 124/74 (BP Location: Right Arm)   Pulse 71   Ht 5' 11"  (1.803 m)   Wt 222 lb 6.4 oz (100.9 kg)   BMI 31.02 kg/m   Wt Readings from Last 3 Encounters:  01/22/21 222 lb 6.4 oz (100.9 kg)  11/18/20 231 lb (104.8 kg)  10/22/20 232 lb (105.2 kg)    BP Readings from Last 3 Encounters:  01/22/21 124/74  10/22/20 (!) 153/82  10/15/20 (!) 144/85     Physical Exam- Limited  Constitutional:  Body mass index is 31.02 kg/m. , not in acute distress, normal state of mind Eyes:  EOMI, no exophthalmos Neck: Supple Cardiovascular: RRR, no murmers, rubs, or gallops, no edema Respiratory: Adequate breathing efforts, no crackles, rales, rhonchi, or wheezing Musculoskeletal: no gross deformities, strength intact in all four extremities, no gross restriction of joint movements Skin:  no rashes, no hyperemia Neurological: no tremor with outstretched hands   POCT ABI Results 01/22/21   Right ABI:  1.19      Left ABI:  1.19  Right leg systolic / diastolic: 211/94 mmHg Left leg systolic / diastolic: 174/08 mmHg  Arm systolic / diastolic: 144/81 mmHG  Detailed report will be scanned into patient chart.  CMP ( most recent) CMP     Component Value Date/Time   NA 141 12/29/2020 0000   K 4.2 12/29/2020 0000   CL 102 12/29/2020 0000   CO2 27 (A) 12/29/2020 0000   GLUCOSE 119 (H) 07/03/2019 1230   BUN 19 12/29/2020 0000   CREATININE 1.4 (A) 12/29/2020 0000   CREATININE 1.00  07/03/2019 1230   CREATININE 0.88 02/21/2012 1153   CALCIUM 10.2 12/29/2020 0000   PROT 7.0 07/03/2019 1230   ALBUMIN 4.8 12/29/2020 0000   AST 23 12/29/2020 0000   ALT 26 12/29/2020 0000   ALKPHOS 61 12/29/2020 0000   BILITOT 0.9 07/03/2019 1230   GFRNONAA 44 12/29/2020 0000   GFRAA 50 12/29/2020 0000     Diabetic Labs (most recent): Lab Results  Component Value Date   HGBA1C 5.4 12/29/2020   HGBA1C 7.9 (A) 10/22/2020   HGBA1C 6.2 (H) 03/18/2015     Lipid Panel ( most recent) Lipid Panel     Component Value Date/Time   CHOL 134 12/29/2020 0000   TRIG 185 (A) 12/29/2020 0000   HDL 39 12/29/2020 0000   CHOLHDL 4.2 03/18/2015 0915   VLDL 44 (H) 03/18/2015 0915   LDLCALC 64 12/29/2020 0000      Lab Results  Component Value Date   TSH 2.69 12/29/2020   TSH 1.66 09/14/2020   TSH 2.770 08/29/2019   TSH 3.261 10/14/2012   TSH 2.651 02/21/2012   TSH 1.981 03/06/2009   TSH 3.250 01/23/2008           Assessment & Plan:   1) Type 2 diabetes mellitus with hyperglycemia, with long-term current use of insulin ([place])  - Michelle Clements has currently uncontrolled symptomatic type 2 DM since 55 years of age, with most recent A1c of  6.9 % (unsure of date).  She presents today with her meter and logs, showing drastically improved glycemic profile with tight fasting readings.  Her previsit A1c was 5.4% on 12/29/20 improving from previous visit of 7.9%.  She has been meeting with the nutritionist routinely, has cut out sweetened foods/drinks.  She denies significant hypoglycemia.   Recent labs reviewed, showing CKD stage 3- stable.    - I had a long discussion with her about the progressive nature of diabetes and the pathology behind its complications. -her diabetes is complicated by sleep apnea, HLD, retinopathy and she remains at a high risk for more acute and chronic complications which include CAD, CVA, CKD, retinopathy, and neuropathy. These are all discussed in detail with  her.  - Nutritional counseling repeated at each appointment due to patients tendency to fall back in to old habits.  - The patient admits there is a room for improvement in their diet and drink choices. -  Suggestion is made for the patient to avoid simple carbohydrates from their diet including Cakes, Sweet Desserts / Pastries, Ice Cream, Soda (diet and regular), Sweet Tea, Candies, Chips, Cookies, Sweet Pastries,  Store Bought Juices, Alcohol in Excess of  1-2 drinks a day, Artificial Sweeteners, Coffee Creamer, and "Sugar-free" Products. This will help patient to have stable blood glucose profile and potentially avoid unintended weight gain.   - I encouraged the patient to switch to  unprocessed or minimally processed complex starch and increased protein intake (animal or plant source), fruits, and vegetables.   - Patient is advised to stick to a routine mealtimes to eat 3 meals  a day and avoid unnecessary snacks ( to snack only to correct hypoglycemia).  - she is seeing RDE after her appointment with me today for diabetes education.  - I have approached her with the following individualized plan to manage  her diabetes and patient agrees:   -Given her tightening fasting glycemic profile, she is advised to decrease her Lantus to 55 units SQ nightly.  She can continue her Metformin 500 mg po twice daily and Januvia 100 mg po daily for now.  May consider decreasing dose at next visit if renal function continues to decline.  She is also advised to maintain adequate hydration.  -She is encouraged to continue monitoring blood glucose twice daily, before breakfast and before bed, and to call the clinic if she has readings less than 70 or greater than 200 for 3 tests in a row.  - Specific targets for  A1c;  LDL, HDL,  and Triglycerides were discussed with the patient.  2) Blood Pressure /Hypertension:   her blood pressure is controlled to target without the use of antihypertensive medications.She  will be considered for low dose ACE/ARB if BP is above 140/90 for 3 separate visits.  3) Lipids/Hyperlipidemia:    Her most recent lipid panel from 12/29/20 shows improved LDL of 64 and mildly elevated triglycerides of 185.  She is advised to continue Crestor 40 mg po daily at bedtime.  Side effects and precautions discussed with her.  4)  Weight/Diet:  Her Body mass index is 31.02 kg/m.  -  clearly complicating her diabetes care.  She has lost approx 10 lbs and is advised to keep it up!  she is a candidate for weight loss. I discussed with her the fact that loss of 5 - 10% of her  current body weight will have the most impact on her diabetes management.  Exercise, and detailed carbohydrates  information provided  -  detailed on discharge instructions.  5) Chronic Care/Health Maintenance: -she is on Statin medications and is encouraged to initiate and continue to follow up with Ophthalmology, Dentist,  Podiatrist at least yearly or according to recommendations, and advised to stay away from smoking. I have recommended yearly flu vaccine and pneumonia vaccine at least every 5 years; moderate intensity exercise for up to 150 minutes weekly; and  sleep for at least 7 hours a day.  - she is advised to maintain close follow up with [name], MD for primary care needs, as well as her other providers for optimal and coordinated care.   - Time spent on this patient care encounter:  40 min, of which > 50% was spent in  counseling and the rest reviewing her blood glucose logs , discussing her hypoglycemia and hyperglycemia episodes, reviewing her current and  previous labs / studies  ( including abstraction from other facilities) and medications  doses and developing a  long term treatment plan and documenting her care.   Please refer to Patient Instructions for Blood Glucose Monitoring and Insulin/Medications Dosing Guide"  in media tab for additional information. Please  also refer to " Patient Self  Inventory" in the Media  tab for reviewed elements of pertinent patient history.  Michelle Clements participated in the discussions, expressed understanding, and voiced agreement with the above plans.  All questions were answered to her satisfaction. she is encouraged to contact clinic should she have any questions or concerns prior to her return visit.   Follow up plan: - Return in about 3 months (around 04/21/2021) for Diabetes follow up with A1c in office, Bring glucometer and logs, Previsit labs.  Rayetta Pigg, [facility] [address] [place], [place] [address] Phone: [phone] Fax: [phone]  01/22/2021, 11:52 AM

## 2021-01-22 NOTE — Progress Notes (Signed)
Medical Nutrition Therapy  Follow Up  Primary concerns today: Uncontrolled blood sugars Referral diagnosis: DM Type 2, e11.65 x 10 yrs. Preferred learning style:  no preference indicated Learning readiness:  change in progress   NUTRITION ASSESSMENT   Anthropometrics  Ht: 5'11" Wt: 222.4 lbs Body mass index is 31.02 kg/m.     Clinical Medical Hx: Bipolar, Fibromyalgia, Hyperlipidemia, Obesity, Type 2 DM Medications: Januvia 100 mg/d Metformin 500 mg BIDand Lantus 65 units. Labs: 12/29/2020 A1c - 5.4 AST - 23 ALT -26 (LFTs were previously elevated)   Notable Signs/Symptoms:  Steriod injection  Lifestyle & Dietary Hx  Pt reports weight loss of 10 lbs since November 18, 2020. Pt reports high intake of almonds. Pt reports 45-60 g of carbs seems like too much food. Pt reports if their blood sugar is under 100 before bed, they will eat a half of a peanut butter sandwich, or a yogurt. Pt reports their favorite dinner is a grilled chicken and a sweet potato. Pt states dinner is their largest meal. Pt reports starting to get values 70s and 80s occasionally. Pt reports not experiencing symptoms at these values. Pt reports experiencing a low recently in [place], pt reports feeling as if they were going to faint. Pt reports eating a strawberry candy and felt better after 15 minutes.   Estimated daily fluid intake: 64 oz Supplements:  Sleep: a lot Stress / self-care:  Current average weekly physical activity: limited due to join pain  24-Hr Dietary Recall  First Meal: Oatmeal with honey, blueberries, and almonds. Lunch: Two Good Yogurt Dinner: 3 oz chicken tips, sweet potatoes, fried okra Beverages: water, coffee, [place]  Estimated Energy Needs Calories: 1500  Carbohydrate: 170g Protein: 112g Fat: 42g   NUTRITION DIAGNOSIS  NB-1.1 Food and nutrition-related knowledge deficit As related to Diabetes Type 2.  As evidenced by A1C 7.9%.   NUTRITION INTERVENTION  Nutrition  education (E-1) on the following topics:  . DM Type 2 . Hyper/hypoglycemia . A1C goals . Meal Planning, . Portion control . Prevention of complications  . Carb counting   Handouts Provided Include   My Plate  Meal Plan Card  Diabetes Instructions   Learning Style & Readiness for Change Teaching method utilized: Visual & Auditory  Demonstrated degree of understanding via: Teach Back  Barriers to learning/adherence to lifestyle change: joint pain, pain meds.  Goals Established by Pt  Continue to recognize your carbohydrates at every meal, and choose the right amount of each card to eat 45-60g at each meal.  Work towards hitting your 45-60g of carbs at lunch.  Check your blood sugar fasting in the morning, and before bed.   MONITORING & EVALUATION Dietary intake, weekly physical activity, f/u in 2 months

## 2021-02-04 ENCOUNTER — Telehealth (HOSPITAL_COMMUNITY): Payer: Self-pay

## 2021-02-04 NOTE — Telephone Encounter (Signed)
Medication problem - Call from pt stating she was having problems with balance since she started Trazodone so her pharmacist advised to stop to see if improved.  Pt. reported she did this but then had problems sleeping so took again last night and has similar feelings this morning along with feeling "hungover".  Patient stated she had been having this balance issue since the first day she took Trazodone but took it to help sleep.  Patient stated she also takes Clonazepam as prescribed and Melatonin at night to help sleep as well.  Patient questions what she could take in place of Trazodone to help with sleep.  Agreed to send message to Dr. Harrington Challenger as patient is scheduled for next appointment on 03/09/21.

## 2021-02-04 NOTE — Telephone Encounter (Signed)
She can try cutting it in half.If that doesn't work call back and we can try something like Belsommra but we will need insurance approval

## 2021-02-04 NOTE — Telephone Encounter (Signed)
Medication management - Telephone call with patient to inform of instructions from Dr. Harrington Challenger to try cutting Trazodone 50 mg in half for a total of 25 mg a night to see if this would help with not having balance issues or being left to feel "hungover" after taking.  Patient agreed to try this and will call back in the coming week if balance or the "hungover" feeling persists after cutting it down to 1/2 a tablet at bedtime. Informed patient if this did not work then Dr. Harrington Challenger reported she would look at other options that may have to be authorized but will wait to see if by simply cutting in half will take away reported side effects.

## 2021-02-08 DIAGNOSIS — G5603 Carpal tunnel syndrome, bilateral upper limbs: Secondary | ICD-10-CM | POA: Diagnosis not present

## 2021-02-08 DIAGNOSIS — Z79891 Long term (current) use of opiate analgesic: Secondary | ICD-10-CM | POA: Diagnosis not present

## 2021-02-08 DIAGNOSIS — G4733 Obstructive sleep apnea (adult) (pediatric): Secondary | ICD-10-CM | POA: Diagnosis not present

## 2021-02-08 DIAGNOSIS — M5412 Radiculopathy, cervical region: Secondary | ICD-10-CM | POA: Diagnosis not present

## 2021-02-08 DIAGNOSIS — M797 Fibromyalgia: Secondary | ICD-10-CM | POA: Diagnosis not present

## 2021-02-08 DIAGNOSIS — M542 Cervicalgia: Secondary | ICD-10-CM | POA: Diagnosis not present

## 2021-02-08 DIAGNOSIS — M545 Low back pain, unspecified: Secondary | ICD-10-CM | POA: Diagnosis not present

## 2021-02-08 DIAGNOSIS — G5620 Lesion of ulnar nerve, unspecified upper limb: Secondary | ICD-10-CM | POA: Diagnosis not present

## 2021-02-15 ENCOUNTER — Encounter: Payer: Self-pay | Admitting: Nurse Practitioner

## 2021-02-16 ENCOUNTER — Telehealth (HOSPITAL_COMMUNITY): Payer: Self-pay | Admitting: *Deleted

## 2021-02-16 ENCOUNTER — Other Ambulatory Visit (HOSPITAL_COMMUNITY): Payer: Self-pay | Admitting: Psychiatry

## 2021-02-16 DIAGNOSIS — G4733 Obstructive sleep apnea (adult) (pediatric): Secondary | ICD-10-CM | POA: Diagnosis not present

## 2021-02-16 DIAGNOSIS — R945 Abnormal results of liver function studies: Secondary | ICD-10-CM | POA: Diagnosis not present

## 2021-02-16 DIAGNOSIS — G589 Mononeuropathy, unspecified: Secondary | ICD-10-CM | POA: Diagnosis not present

## 2021-02-16 DIAGNOSIS — D839 Common variable immunodeficiency, unspecified: Secondary | ICD-10-CM | POA: Diagnosis not present

## 2021-02-16 DIAGNOSIS — E6609 Other obesity due to excess calories: Secondary | ICD-10-CM | POA: Diagnosis not present

## 2021-02-16 DIAGNOSIS — F339 Major depressive disorder, recurrent, unspecified: Secondary | ICD-10-CM | POA: Diagnosis not present

## 2021-02-16 DIAGNOSIS — R5382 Chronic fatigue, unspecified: Secondary | ICD-10-CM | POA: Diagnosis not present

## 2021-02-16 DIAGNOSIS — M4800 Spinal stenosis, site unspecified: Secondary | ICD-10-CM | POA: Diagnosis not present

## 2021-02-16 DIAGNOSIS — D509 Iron deficiency anemia, unspecified: Secondary | ICD-10-CM | POA: Diagnosis not present

## 2021-02-16 DIAGNOSIS — E119 Type 2 diabetes mellitus without complications: Secondary | ICD-10-CM | POA: Diagnosis not present

## 2021-02-16 DIAGNOSIS — J45909 Unspecified asthma, uncomplicated: Secondary | ICD-10-CM | POA: Diagnosis not present

## 2021-02-16 DIAGNOSIS — G894 Chronic pain syndrome: Secondary | ICD-10-CM | POA: Diagnosis not present

## 2021-02-16 MED ORDER — TRAZODONE HCL 50 MG PO TABS: 50.0000 mg | ORAL_TABLET | Freq: Every day | ORAL | 2 refills | Status: DC

## 2021-02-16 NOTE — Telephone Encounter (Signed)
I sent it last month, but resent it today

## 2021-02-16 NOTE — Telephone Encounter (Signed)
Per previous message, Medication is Trazodone.

## 2021-02-16 NOTE — Telephone Encounter (Signed)
Per pt per previous message, her medications are bubble pack and she wants Dr. Harrington Challenger to please send in 50 mg 1/2 tablets QHS. Per pt she don't get the bottles because of the bubble pack being already set up for her at the pharmacy.

## 2021-02-16 NOTE — Telephone Encounter (Signed)
sent 

## 2021-02-16 NOTE — Telephone Encounter (Signed)
Patient stated she wants her Trazodone sent to [facility] not the pharmacy it was just sent to.

## 2021-02-17 ENCOUNTER — Other Ambulatory Visit (HOSPITAL_COMMUNITY): Payer: Self-pay | Admitting: Psychiatry

## 2021-02-17 MED ORDER — TRAZODONE HCL 50 MG PO TABS: 25.0000 mg | ORAL_TABLET | Freq: Every day | ORAL | 2 refills | Status: DC

## 2021-02-17 NOTE — Telephone Encounter (Signed)
It doesn't come in a 25mg , I resent the 50 mg, she'll need to break it in half

## 2021-02-17 NOTE — Telephone Encounter (Signed)
[facility] stated [facility], the Trazodone direction was not correct. Per pt it needed to say take half of tablet of the 50 mg QHS. Or provider can send in 25 mg QHS.

## 2021-02-17 NOTE — Telephone Encounter (Signed)
Per pt she is not able to break the tablet in half due to it coming in a bubble pack from the pharmacy and she don't want to confuse herself

## 2021-02-17 NOTE — Telephone Encounter (Signed)
Like I said, it does not come in a 25mg  pill. She will have to buy a pill cutter

## 2021-02-18 NOTE — Telephone Encounter (Signed)
Opened in Error.

## 2021-02-18 NOTE — Telephone Encounter (Signed)
Patient is aware 

## 2021-02-23 ENCOUNTER — Encounter (HOSPITAL_COMMUNITY): Payer: Self-pay

## 2021-02-23 ENCOUNTER — Other Ambulatory Visit (HOSPITAL_COMMUNITY): Payer: Self-pay

## 2021-02-23 DIAGNOSIS — D839 Common variable immunodeficiency, unspecified: Secondary | ICD-10-CM

## 2021-02-23 NOTE — Progress Notes (Signed)
Patient declines the need for follow-up with Dr. Delton Coombes. Dr. Delton Coombes and Dr. [name] made aware.

## 2021-02-23 NOTE — Progress Notes (Signed)
Labs received from Dr. [name] and reviewed by Dr. Delton Coombes. Additional orders placed for CBCd and CMP per Dr. Delton Coombes. Patient to be scheduled for labs and follow-up appt.

## 2021-03-01 DIAGNOSIS — M5412 Radiculopathy, cervical region: Secondary | ICD-10-CM | POA: Diagnosis not present

## 2021-03-01 DIAGNOSIS — M461 Sacroiliitis, not elsewhere classified: Secondary | ICD-10-CM | POA: Diagnosis not present

## 2021-03-01 DIAGNOSIS — M4802 Spinal stenosis, cervical region: Secondary | ICD-10-CM | POA: Diagnosis not present

## 2021-03-01 DIAGNOSIS — M47816 Spondylosis without myelopathy or radiculopathy, lumbar region: Secondary | ICD-10-CM | POA: Diagnosis not present

## 2021-03-09 ENCOUNTER — Telehealth (INDEPENDENT_AMBULATORY_CARE_PROVIDER_SITE_OTHER): Payer: HMO | Admitting: Psychiatry

## 2021-03-09 ENCOUNTER — Encounter (HOSPITAL_COMMUNITY): Payer: Self-pay | Admitting: Psychiatry

## 2021-03-09 ENCOUNTER — Other Ambulatory Visit: Payer: Self-pay

## 2021-03-09 DIAGNOSIS — F332 Major depressive disorder, recurrent severe without psychotic features: Secondary | ICD-10-CM | POA: Diagnosis not present

## 2021-03-09 MED ORDER — VALBENAZINE TOSYLATE 80 MG PO CAPS: 80.0000 mg | ORAL_CAPSULE | Freq: Every day | ORAL | 2 refills | Status: DC

## 2021-03-09 MED ORDER — FLUOXETINE HCL 40 MG PO CAPS: 40.0000 mg | ORAL_CAPSULE | Freq: Every day | ORAL | 2 refills | Status: DC

## 2021-03-09 MED ORDER — TRAZODONE HCL 50 MG PO TABS: 25.0000 mg | ORAL_TABLET | Freq: Every day | ORAL | 2 refills | Status: DC

## 2021-03-09 MED ORDER — CLONAZEPAM 0.5 MG PO TABS: 0.5000 mg | ORAL_TABLET | Freq: Two times a day (BID) | ORAL | 2 refills | Status: DC | PRN
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
## 2021-03-10 DIAGNOSIS — M461 Sacroiliitis, not elsewhere classified: Secondary | ICD-10-CM | POA: Diagnosis not present

## 2021-03-18 ENCOUNTER — Encounter (HOSPITAL_COMMUNITY): Payer: Self-pay

## 2021-04-04 DIAGNOSIS — R0602 Shortness of breath: Secondary | ICD-10-CM | POA: Diagnosis not present

## 2021-04-04 DIAGNOSIS — J45909 Unspecified asthma, uncomplicated: Secondary | ICD-10-CM | POA: Diagnosis not present

## 2021-04-04 DIAGNOSIS — K219 Gastro-esophageal reflux disease without esophagitis: Secondary | ICD-10-CM | POA: Diagnosis not present

## 2021-04-04 DIAGNOSIS — E1165 Type 2 diabetes mellitus with hyperglycemia: Secondary | ICD-10-CM | POA: Diagnosis not present

## 2021-04-04 DIAGNOSIS — J069 Acute upper respiratory infection, unspecified: Secondary | ICD-10-CM | POA: Diagnosis not present

## 2021-04-04 DIAGNOSIS — R030 Elevated blood-pressure reading, without diagnosis of hypertension: Secondary | ICD-10-CM | POA: Diagnosis not present

## 2021-04-06 DIAGNOSIS — M545 Low back pain, unspecified: Secondary | ICD-10-CM | POA: Diagnosis not present

## 2021-04-06 DIAGNOSIS — Z79891 Long term (current) use of opiate analgesic: Secondary | ICD-10-CM | POA: Diagnosis not present

## 2021-04-06 DIAGNOSIS — G894 Chronic pain syndrome: Secondary | ICD-10-CM | POA: Diagnosis not present

## 2021-04-06 DIAGNOSIS — Z79899 Other long term (current) drug therapy: Secondary | ICD-10-CM | POA: Diagnosis not present

## 2021-04-06 DIAGNOSIS — M542 Cervicalgia: Secondary | ICD-10-CM | POA: Diagnosis not present

## 2021-04-12 DIAGNOSIS — G4733 Obstructive sleep apnea (adult) (pediatric): Secondary | ICD-10-CM

## 2021-04-12 DIAGNOSIS — D509 Iron deficiency anemia, unspecified: Secondary | ICD-10-CM | POA: Insufficient documentation

## 2021-04-12 DIAGNOSIS — J069 Acute upper respiratory infection, unspecified: Secondary | ICD-10-CM | POA: Diagnosis not present

## 2021-04-12 DIAGNOSIS — D849 Immunodeficiency, unspecified: Secondary | ICD-10-CM | POA: Insufficient documentation

## 2021-04-12 DIAGNOSIS — E039 Hypothyroidism, unspecified: Secondary | ICD-10-CM | POA: Insufficient documentation

## 2021-04-12 DIAGNOSIS — R030 Elevated blood-pressure reading, without diagnosis of hypertension: Secondary | ICD-10-CM | POA: Diagnosis not present

## 2021-04-12 DIAGNOSIS — J45909 Unspecified asthma, uncomplicated: Secondary | ICD-10-CM | POA: Diagnosis not present

## 2021-04-12 DIAGNOSIS — R0602 Shortness of breath: Secondary | ICD-10-CM | POA: Diagnosis not present

## 2021-04-12 HISTORY — DX: Obstructive sleep apnea (adult) (pediatric): G47.33

## 2021-04-14 DIAGNOSIS — J45909 Unspecified asthma, uncomplicated: Secondary | ICD-10-CM | POA: Diagnosis not present

## 2021-04-14 DIAGNOSIS — G589 Mononeuropathy, unspecified: Secondary | ICD-10-CM | POA: Diagnosis not present

## 2021-04-14 DIAGNOSIS — G894 Chronic pain syndrome: Secondary | ICD-10-CM | POA: Diagnosis not present

## 2021-04-14 DIAGNOSIS — F339 Major depressive disorder, recurrent, unspecified: Secondary | ICD-10-CM | POA: Diagnosis not present

## 2021-04-14 DIAGNOSIS — D839 Common variable immunodeficiency, unspecified: Secondary | ICD-10-CM | POA: Diagnosis not present

## 2021-04-14 DIAGNOSIS — M4800 Spinal stenosis, site unspecified: Secondary | ICD-10-CM | POA: Diagnosis not present

## 2021-04-14 DIAGNOSIS — D509 Iron deficiency anemia, unspecified: Secondary | ICD-10-CM | POA: Diagnosis not present

## 2021-04-14 DIAGNOSIS — R5382 Chronic fatigue, unspecified: Secondary | ICD-10-CM | POA: Diagnosis not present

## 2021-04-14 DIAGNOSIS — E6609 Other obesity due to excess calories: Secondary | ICD-10-CM | POA: Diagnosis not present

## 2021-04-14 DIAGNOSIS — R945 Abnormal results of liver function studies: Secondary | ICD-10-CM | POA: Diagnosis not present

## 2021-04-14 DIAGNOSIS — G4733 Obstructive sleep apnea (adult) (pediatric): Secondary | ICD-10-CM | POA: Diagnosis not present

## 2021-04-14 DIAGNOSIS — E119 Type 2 diabetes mellitus without complications: Secondary | ICD-10-CM | POA: Diagnosis not present

## 2021-04-14 LAB — BASIC METABOLIC PANEL
BUN: 27 — AB (ref 4–21)
CO2: 27 — AB (ref 13–22)
Chloride: 100 (ref 99–108)
Creatinine: 1.4 — AB (ref 0.5–1.1)
Glucose: 73
Potassium: 4 (ref 3.4–5.3)
Sodium: 142 (ref 137–147)

## 2021-04-14 LAB — COMPREHENSIVE METABOLIC PANEL
Albumin: 4.7 (ref 3.5–5.0)
Calcium: 10 (ref 8.7–10.7)
Globulin: 1.6

## 2021-04-14 LAB — HEPATIC FUNCTION PANEL
ALT: 108 — AB (ref 7–35)
AST: 56 — AB (ref 13–35)
Alkaline Phosphatase: 66 (ref 25–125)
Bilirubin, Total: 0.4

## 2021-04-15 ENCOUNTER — Encounter: Payer: Self-pay | Admitting: Nurse Practitioner

## 2021-04-21 ENCOUNTER — Encounter: Payer: Self-pay | Admitting: Nurse Practitioner

## 2021-04-21 ENCOUNTER — Other Ambulatory Visit: Payer: Self-pay

## 2021-04-21 ENCOUNTER — Encounter: Payer: HMO | Attending: Nurse Practitioner | Admitting: Nutrition

## 2021-04-21 ENCOUNTER — Other Ambulatory Visit (HOSPITAL_COMMUNITY): Payer: Self-pay | Admitting: Family Medicine

## 2021-04-21 ENCOUNTER — Ambulatory Visit (INDEPENDENT_AMBULATORY_CARE_PROVIDER_SITE_OTHER): Payer: HMO | Admitting: Nurse Practitioner

## 2021-04-21 VITALS — BP 138/82 | HR 79 | Ht 71.0 in | Wt 216.0 lb

## 2021-04-21 DIAGNOSIS — E1165 Type 2 diabetes mellitus with hyperglycemia: Secondary | ICD-10-CM | POA: Diagnosis not present

## 2021-04-21 DIAGNOSIS — E119 Type 2 diabetes mellitus without complications: Secondary | ICD-10-CM | POA: Diagnosis not present

## 2021-04-21 DIAGNOSIS — E782 Mixed hyperlipidemia: Secondary | ICD-10-CM | POA: Insufficient documentation

## 2021-04-21 DIAGNOSIS — Z794 Long term (current) use of insulin: Secondary | ICD-10-CM

## 2021-04-21 DIAGNOSIS — N183 Chronic kidney disease, stage 3 unspecified: Secondary | ICD-10-CM | POA: Insufficient documentation

## 2021-04-21 DIAGNOSIS — I10 Essential (primary) hypertension: Secondary | ICD-10-CM | POA: Diagnosis not present

## 2021-04-21 DIAGNOSIS — R051 Acute cough: Secondary | ICD-10-CM

## 2021-04-21 LAB — POCT GLYCOSYLATED HEMOGLOBIN (HGB A1C): Hemoglobin A1C: 5.4 % (ref 4.0–5.6)

## 2021-04-21 MED ORDER — ROSUVASTATIN CALCIUM 20 MG PO TABS: 20.0000 mg | ORAL_TABLET | Freq: Every day | ORAL | 3 refills | Status: DC

## 2021-04-21 NOTE — Progress Notes (Signed)
Endocrinology Follow Up Visit       04/21/2021, 12:05 PM   Subjective:    Patient ID: Michelle Clements, female    DOB: 29-Jan-1966.  Michelle Clements is being seen in follow up after being seen in consultation for management of currently uncontrolled symptomatic diabetes requested by  [name], MD.   Past Medical History:  Diagnosis Date  . Allergic rhinitis   . Anemia   . Arthritis    Lower back and hips  . Asthma   . Bipolar disorder ([place])   . 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[place])   . Constipation   . 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 Depression   . Essential hypertension, benign   . Fibromyalgia   . GERD (gastroesophageal reflux disease)   . H/O sleep apnea   . Hip pain, left   . History of palpitations    Negative Holter monitor  . History of pneumonia 1988  . Hyperlipidemia   . IBS (irritable bowel syndrome)   . Neck pain   . Poor short term memory   . PTSD (post-traumatic stress disorder)   . S/P Botox injection 11/2014    For migraine headaches  . Type 2 diabetes mellitus ([place])   . Urgency of urination     Past Surgical History:  Procedure Laterality Date  . CARPAL TUNNEL RELEASE Right 06/10/2013   Procedure: CARPAL TUNNEL RELEASE;  Surgeon: Faythe Ghee, MD;  Location: [facility];  Service: Neurosurgery;  Laterality: Right;  Right Carpal Tunnel Release   . CHOLECYSTECTOMY    . DENTAL SURGERY  12/2014  . mole exc     x 3  . MUSCLE BIOPSY  2008   Right leg  . TUBAL LIGATION      Social History   Socioeconomic History  . Marital status: Married    Spouse name: Not on file  . Number of children: 2  . Years of education: College  . Highest education level: Not on file  Occupational History  . 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 Smoking status: Former Smoker    Types: Cigarettes     Quit date: 11/04/2010    Years since quitting: 10.4  . Smokeless tobacco: Never Used  Substance and Sexual Activity  . Alcohol use: Yes    Alcohol/week: 0.0 standard drinks    Comment: one mixed drink per week  . Drug use: No  . Sexual activity: Yes    Birth control/protection: Surgical  Other Topics Concern  . Not on file  Social History Narrative   Married   Lives with spouse and daughter   Right handed.   Caffeine use: 2 cups coffee in morning and 2 cups tea during the day    Social Determinants of Health   Financial Resource Strain: Not on file  Food Insecurity: Not on file  Transportation Needs: Not on file  Physical Activity: Not on file  Stress: Not on file  Social Connections: Not on file    Family History  Problem Relation Age of Onset  . Fibromyalgia  Mother   . Diabetes type II Mother   . Depression Mother   . Alzheimer's disease Mother   . GER disease Mother   . Heart disease Mother   . Paranoid behavior Mother   . Dementia Mother   . Heart attack Father 31       MI x5  . Stroke Father        CVA x7  . Asthma Father   . Brain cancer Father   . Seizures Father   . Anxiety disorder Sister   . OCD Sister   . Sexual abuse Sister   . Physical abuse Sister   . Anxiety disorder Sister   . ADD / ADHD Daughter   . ADD / ADHD Daughter   . Alcohol abuse Neg Hx   . Drug abuse Neg Hx   . Schizophrenia Neg Hx     Outpatient Encounter Medications as of 04/21/2021  Medication Sig  . albuterol (PROVENTIL HFA;VENTOLIN HFA) 108 (90 BASE) MCG/ACT inhaler Inhale 2 puffs into the lungs every 6 (six) hours as needed for wheezing or shortness of breath.  . Ascorbic Acid (VITAMIN C) 1000 MG tablet Take 1,000 mg by mouth daily.  . Blood Glucose Monitoring Suppl ([place]) w/Device KIT See admin instructions.  . Cholecalciferol (VITAMIN D) 50 MCG (2000 UT) tablet Take 1,000 Units by mouth daily.  . clonazePAM (KLONOPIN) 0.5 MG tablet Take 1 tablet (0.5  mg total) by mouth 2 (two) times daily as needed for anxiety.  Marland Kitchen estradiol (ESTRACE) 0.1 MG/GM vaginal cream Place vaginally.  Marland Kitchen FLUoxetine (PROZAC) 40 MG capsule Take 1 capsule (40 mg total) by mouth daily.  . Glucos-Chond-Hyal Ac-Ca Fructo (MOVE FREE JOINT HEALTH ADVANCE PO) Take by mouth.  Marland Kitchen HYDROcodone-acetaminophen (NORCO) 7.5-325 MG tablet 1 tablet every 4 (four) hours as needed.   Marland Kitchen ibuprofen (ADVIL) 800 MG tablet Take 800 mg by mouth 2 (two) times daily.  . insulin glargine (LANTUS SOLOSTAR) 100 UNIT/ML Solostar Pen Inject 50 Units into the skin daily.  . Lancets (ONETOUCH ULTRASOFT) lancets 1 each by Other route as needed.   Marland Kitchen levocetirizine (XYZAL) 5 MG tablet Take 5 mg by mouth at bedtime.  Marland Kitchen lubiprostone (AMITIZA) 24 MCG capsule TAKE ONE CAPSULE BY MOUTH EVERY MORNING and TAKE ONE CAPSULE BY MOUTH EVERY EVENING  . Melatonin 5 MG TABS Take 5 mg by mouth at bedtime as needed.   . naproxen sodium (ALEVE) 220 MG tablet Take 220 mg by mouth 2 (two) times daily as needed.  Marland Kitchen omeprazole (PRILOSEC) 20 MG capsule Take 20 mg by mouth daily.  . pregabalin (LYRICA) 200 MG capsule Take 200 mg by mouth 3 (three) times daily.  . rosuvastatin (CRESTOR) 20 MG tablet Take 1 tablet (20 mg total) by mouth at bedtime.  . sennosides-docusate sodium (SENOKOT-S) 8.6-50 MG tablet Take 1 tablet by mouth daily.  . sitaGLIPtin (JANUVIA) 100 MG tablet Take 100 mg by mouth daily.  Marland Kitchen tiZANidine (ZANAFLEX) 4 MG tablet Take 4 mg by mouth daily. $RemoveBefo'4mg'MOMqEjFVkbt$  at bedtime and 2 mg during the day  . traZODone (DESYREL) 50 MG tablet Take 0.5 tablets (25 mg total) by mouth at bedtime.  . valbenazine (INGREZZA) 80 MG capsule Take 1 capsule (80 mg total) by mouth daily.  . vitamin B-12 (CYANOCOBALAMIN) 100 MCG tablet Take 100 mcg by mouth daily.  Marland Kitchen zinc gluconate 50 MG tablet Take 50 mg by mouth daily.  . [DISCONTINUED] metFORMIN (GLUCOPHAGE) 500 MG tablet Take 500 mg  by mouth 2 (two) times daily.  . [DISCONTINUED] rosuvastatin  (CRESTOR) 40 MG tablet Take 20 mg by mouth daily.   No facility-administered encounter medications on file as of 04/21/2021.    ALLERGIES: Allergies  Allergen Reactions  . Mold Extract  [Trichophyton] Shortness Of Breath    wheezing  . Other Anaphylaxis, Shortness Of Breath and Other (See Comments)     : Angioedema wheezing Wheezing wheezing Other reaction(s): Cough Wheezing Itching and rash Wheezing wheezing  . Sulfa Antibiotics Anaphylaxis     : Angioedema   . Sulfonamide Derivatives Anaphylaxis     : Angioedema  . Gluten Meal Itching  . Molds & Smuts   . Clonazepam Other (See Comments)    Confusion and memory loss Caused nconfusion  . Dust Mite Extract   . Tegretol [Carbamazepine] Rash    VACCINATION STATUS: Immunization History  Administered Date(s) Administered  . Influenza Whole 10/03/2007  . Influenza-Unspecified 09/18/2014, 09/23/2015  . Pneumococcal Polysaccharide-23 10/14/2012    Diabetes She presents for her follow-up diabetic visit. She has type 2 diabetes mellitus. Onset time: She was diagnosed at approximate age of 78. Her disease course has been stable. Hypoglycemia symptoms include sweats and tremors. Pertinent negatives for diabetes include no blurred vision, no fatigue, no polydipsia, no polyphagia and no polyuria. There are no hypoglycemic complications. Symptoms are stable. Diabetic complications include nephropathy and retinopathy. Risk factors for coronary artery disease include diabetes mellitus, dyslipidemia, obesity and sedentary lifestyle. Current diabetic treatment includes insulin injections and oral agent (dual therapy). She is compliant with treatment all of the time. Her weight is decreasing steadily. She is following a generally healthy diet. Meal planning includes avoidance of concentrated sweets. She has not had a previous visit with a dietitian. She participates in exercise intermittently. Her home blood glucose trend is fluctuating  minimally. Her breakfast blood glucose range is generally 70-90 mg/dl. Her lunch blood glucose range is generally 90-110 mg/dl. Her dinner blood glucose range is generally 90-110 mg/dl. Her bedtime blood glucose range is generally 110-130 mg/dl. (She presents today with her meter and logs showing continued tight fasting and at goal postprandial glycemic profile despite being on several rounds of steroids since last visit.  Her POCT A1c today is 5.4%, unchanged from previous visit.  She does have mild fasting hypoglycemia noted.  ) An ACE inhibitor/angiotensin II receptor blocker is not being taken. She does not see a podiatrist.Eye exam is current.  Hyperlipidemia This is a chronic problem. The current episode started more than 1 year ago. The problem is controlled. Recent lipid tests were reviewed and are normal. Exacerbating diseases include chronic renal disease, diabetes and obesity. There are no known factors aggravating her hyperlipidemia. Current antihyperlipidemic treatment includes statins. The current treatment provides moderate improvement of lipids. There are no compliance problems.  Risk factors for coronary artery disease include diabetes mellitus, dyslipidemia, obesity and a sedentary lifestyle.    Review of systems  Constitutional: + steadily decreasing body weight,  current Body mass index is 30.13 kg/m. , no fatigue, no subjective hyperthermia, no subjective hypothermia Eyes: no blurry vision, no xerophthalmia ENT: no sore throat, no nodules palpated in throat, no dysphagia/odynophagia, no hoarseness Cardiovascular: no chest pain, no shortness of breath, no palpitations, no leg swelling Respiratory: no cough, no shortness of breath Gastrointestinal: no nausea/vomiting/diarrhea Musculoskeletal: + complains of BLE cramping Skin: no rashes, no hyperemia Neurological: no tremors, no numbness, no tingling, no dizziness Psychiatric: no depression, no anxiety   Objective:  BP  138/82   Pulse 79   Ht $R'5\' 11"'BF$  (1.803 m)   Wt 216 lb (98 kg)   BMI 30.13 kg/m   Wt Readings from Last 3 Encounters:  04/21/21 216 lb (98 kg)  01/22/21 222 lb 6.4 oz (100.9 kg)  11/18/20 231 lb (104.8 kg)    BP Readings from Last 3 Encounters:  04/21/21 138/82  01/22/21 124/74  10/22/20 (!) 153/82     Physical Exam- Limited  Constitutional:  Body mass index is 30.13 kg/m. , not in acute distress, normal state of mind Eyes:  EOMI, no exophthalmos Neck: Supple Cardiovascular: RRR, no murmurs, rubs, or gallops, no edema Respiratory: Adequate breathing efforts, no crackles, rales, rhonchi, or wheezing Musculoskeletal: no gross deformities, strength intact in all four extremities, no gross restriction of joint movements Skin:  no rashes, no hyperemia Neurological: no tremor with outstretched hands    CMP ( most recent) CMP     Component Value Date/Time   NA 142 04/14/2021 0000   K 4.0 04/14/2021 0000   CL 100 04/14/2021 0000   CO2 27 (A) 04/14/2021 0000   GLUCOSE 119 (H) 07/03/2019 1230   BUN 27 (A) 04/14/2021 0000   CREATININE 1.4 (A) 04/14/2021 0000   CREATININE 1.00 07/03/2019 1230   CREATININE 0.88 02/21/2012 1153   CALCIUM 10.0 04/14/2021 0000   PROT 7.0 07/03/2019 1230   ALBUMIN 4.7 04/14/2021 0000   AST 56 (A) 04/14/2021 0000   ALT 108 (A) 04/14/2021 0000   ALKPHOS 66 04/14/2021 0000   BILITOT 0.9 07/03/2019 1230   GFRNONAA 44 12/29/2020 0000   GFRAA 50 12/29/2020 0000     Diabetic Labs (most recent): Lab Results  Component Value Date   HGBA1C 5.4 04/21/2021   HGBA1C 5.4 12/29/2020   HGBA1C 7.9 (A) 10/22/2020     Lipid Panel ( most recent) Lipid Panel     Component Value Date/Time   CHOL 134 12/29/2020 0000   TRIG 185 (A) 12/29/2020 0000   HDL 39 12/29/2020 0000   CHOLHDL 4.2 03/18/2015 0915   VLDL 44 (H) 03/18/2015 0915   LDLCALC 64 12/29/2020 0000      Lab Results  Component Value Date   TSH 2.69 12/29/2020   TSH 1.66 09/14/2020    TSH 2.770 08/29/2019   TSH 3.261 10/14/2012   TSH 2.651 02/21/2012   TSH 1.981 03/06/2009   TSH 3.250 01/23/2008           Assessment & Plan:   1) Type 2 diabetes mellitus with hyperglycemia, with long-term current use of insulin (HCC)  - Michelle Clements has currently uncontrolled symptomatic type 2 DM since 55 years of age.  She presents today with her meter and logs showing continued tight fasting and at goal postprandial glycemic profile despite being on several rounds of steroids since last visit.  Her POCT A1c today is 5.4%, unchanged from previous visit.  She does have mild fasting hypoglycemia noted.     Recent labs reviewed, showing worsening CKD stage 3 and elevated LFTs (maybe related to prednisone use?).  Will repeat CMP in 1 week.   - I had a long discussion with her about the progressive nature of diabetes and the pathology behind its complications. -her diabetes is complicated by sleep apnea, HLD, retinopathy and she remains at a high risk for more acute and chronic complications which include CAD, CVA, CKD, retinopathy, and neuropathy. These are all discussed in detail with her.  - Nutritional counseling repeated  at each appointment due to patients tendency to fall back in to old habits.  - The patient admits there is a room for improvement in their diet and drink choices. -  Suggestion is made for the patient to avoid simple carbohydrates from their diet including Cakes, Sweet Desserts / Pastries, Ice Cream, Soda (diet and regular), Sweet Tea, Candies, Chips, Cookies, Sweet Pastries, Store Bought Juices, Alcohol in Excess of 1-2 drinks a day, Artificial Sweeteners, Coffee Creamer, and "Sugar-free" Products. This will help patient to have stable blood glucose profile and potentially avoid unintended weight gain.   - I encouraged the patient to switch to unprocessed or minimally processed complex starch and increased protein intake (animal or plant source), fruits, and  vegetables.   - Patient is advised to stick to a routine mealtimes to eat 3 meals a day and avoid unnecessary snacks (to snack only to correct hypoglycemia).  - she is seeing RDE after her appointment with me today for diabetes education.  - I have approached her with the following individualized plan to manage  her diabetes and patient agrees:   -Given her steady decline in her kidney function, will stop her Metformin altogether at this time.  She can continue Januvia 100 mg po daily for now (may consider stopping at next visit if kidneys have not improved with stopping Metformin alone).  She can continue her Lantus at 50 units SQ nightly (I do expect her glucose to rebound slightly after stopping the Metformin).   -She is encouraged to continue monitoring blood glucose twice daily, before breakfast and before bed, and to call the clinic if she has readings less than 70 or greater than 200 for 3 tests in a row.  - Specific targets for  A1c;  LDL, HDL,  and Triglycerides were discussed with the patient.  2) Blood Pressure /Hypertension:   her blood pressure is controlled to target without the use of antihypertensive medications.She will be considered for low dose ACE/ARB if BP is above 140/90 for 3 separate visits.  3) Lipids/Hyperlipidemia:    Her most recent lipid panel from 12/29/20 shows improved LDL of 64 and mildly elevated triglycerides of 185.  Based on her elevated LFTs, she is advised to lower her dose of Crestor to 20 mg po daily at bedtime.  Side effects and precautions discussed with her.  4)  Weight/Diet:  Her Body mass index is 30.13 kg/m.  -  clearly complicating her diabetes care.  She has lost approx 10 lbs and is advised to keep it up!  she is a candidate for weight loss. I discussed with her the fact that loss of 5 - 10% of her  current body weight will have the most impact on her diabetes management.  Exercise, and detailed carbohydrates information provided  -  detailed on  discharge instructions.  5) Chronic Care/Health Maintenance: -she is on Statin medications and is encouraged to initiate and continue to follow up with Ophthalmology, Dentist,  Podiatrist at least yearly or according to recommendations, and advised to stay away from smoking. I have recommended yearly flu vaccine and pneumonia vaccine at least every 5 years; moderate intensity exercise for up to 150 minutes weekly; and  sleep for at least 7 hours a day.  - she is advised to maintain close follow up with [name], MD for primary care needs, as well as her other providers for optimal and coordinated care.     I spent 45 minutes in the care  of the patient today including review of labs from [place], Lipids, Thyroid Function, Hematology (current and previous including abstractions from other facilities); face-to-face time discussing  her blood glucose readings/logs, discussing hypoglycemia and hyperglycemia episodes and symptoms, medications doses, her options of short and long term treatment based on the latest standards of care / guidelines;  discussion about incorporating lifestyle medicine;  and documenting the encounter.    Please refer to Patient Instructions for Blood Glucose Monitoring and Insulin/Medications Dosing Guide"  in media tab for additional information. Please  also refer to " Patient Self Inventory" in the Media  tab for reviewed elements of pertinent patient history.  Michelle Clements participated in the discussions, expressed understanding, and voiced agreement with the above plans.  All questions were answered to her satisfaction. she is encouraged to contact clinic should she have any questions or concerns prior to her return visit.   Follow up plan: - Return in about 4 months (around 08/22/2021) for Diabetes F/U with A1c in office, Bring meter and logs, Previsit labs.  Rayetta Pigg, [facility] [address] [place], [place] [address] Phone:  [phone] Fax: [phone]  04/21/2021, 12:05 PM

## 2021-04-21 NOTE — Patient Instructions (Signed)

## 2021-04-21 NOTE — Progress Notes (Signed)
Medical Nutrition Therapy  Follow Up  Primary concerns today: Uncontrolled blood sugars Referral diagnosis: DM Type 2, e11.65 x 10 yrs. Preferred learning style:  no preference indicated Learning readiness:  change in progress   NUTRITION ASSESSMENT   Anthropometrics  Ht: 5'11" Wt: 222.4 lbs  Clinical Medical Hx: Bipolar, Fibromyalgia, Hyperlipidemia, Obesity, Type 2 DM Medications: Januvia 100 mg/d Metformin 500 mg BID and Lantus 65 units. Labs: 12/29/2020 A1c - 5.4  AST - 23 ALT -26 (LFTs were previously elevated) Her PCP stopped her cholesterol medications due to elevated liver enzymes. Wants to lose more  weight. On prednisone  from for 10 days due to  allergies and asthma. Lost 6 lbs since last visit.   Notable Signs/Symptoms:  Steriod injections taken for breathing issues.  Lifestyle & Dietary Hx Working on making better food choices.   Estimated daily fluid intake: 64 oz Supplements:  Sleep: a lot Stress / self-care:  Current average weekly physical activity:   24-Hr Dietary Recall  First Meal: Oatmeal with honey, blueberries,  L) [place] and cheese wrap, fruit, water Dinner: Sirloin steak, broccoli, red potatoes, cantaloupe and strawberries. Beverages: water, coffee, [place]  Estimated Energy Needs Calories: 1500  Carbohydrate: 170g Protein: 112g Fat: 42g   NUTRITION DIAGNOSIS  NB-1.1 Food and nutrition-related knowledge deficit As related to Diabetes Type 2.  As evidenced by A1C 7.9%.   NUTRITION INTERVENTION  Nutrition education (E-1) on the following topics:  . DM Type 2 . Hyper/hypoglycemia . A1C goals . Meal Planning, . Portion control . Prevention of complications  . Carb counting   Handouts Provided Include     Learning Style & Readiness for Change Teaching method utilized: Visual & Auditory  Demonstrated degree of understanding via: Teach Back  Barriers to learning/adherence to lifestyle change: joint pain, pain meds.  Goals  Established by Pt  Get in 150 minutes of physical activity weekly Download MapMYWalk or walking app on your phone to track steps or activity Try Myfitnesspal for food journal and exercise log.   MONITORING & EVALUATION Dietary intake, weekly physical activity, f/u in 4-5 months

## 2021-04-21 NOTE — Patient Instructions (Addendum)
Goals  Get in 150 minutes of physical activity weekly Download MapMYWalk or walking app on your phone to track steps or activity Try Myfitnesspal for food journal and exercise log.

## 2021-04-22 ENCOUNTER — Other Ambulatory Visit: Payer: Self-pay

## 2021-04-22 ENCOUNTER — Ambulatory Visit (HOSPITAL_COMMUNITY)
Admission: RE | Admit: 2021-04-22 | Discharge: 2021-04-22 | Disposition: A | Discharge status: Home / Self Care | Payer: HMO | Source: Ambulatory Visit | Attending: Family Medicine | Admitting: Family Medicine

## 2021-04-22 DIAGNOSIS — R051 Acute cough: Secondary | ICD-10-CM | POA: Insufficient documentation

## 2021-04-22 DIAGNOSIS — R0602 Shortness of breath: Secondary | ICD-10-CM | POA: Diagnosis not present

## 2021-04-22 DIAGNOSIS — R059 Cough, unspecified: Secondary | ICD-10-CM | POA: Diagnosis not present

## 2021-04-22 MED ORDER — SITAGLIPTIN PHOSPHATE 100 MG PO TABS: 100.0000 mg | ORAL_TABLET | Freq: Every day | ORAL | 0 refills | Status: DC

## 2021-04-28 ENCOUNTER — Encounter: Payer: Self-pay | Admitting: Nutrition

## 2021-04-30 DIAGNOSIS — R300 Dysuria: Secondary | ICD-10-CM | POA: Insufficient documentation

## 2021-04-30 DIAGNOSIS — F514 Sleep terrors [night terrors]: Secondary | ICD-10-CM | POA: Insufficient documentation

## 2021-04-30 DIAGNOSIS — F411 Generalized anxiety disorder: Secondary | ICD-10-CM | POA: Insufficient documentation

## 2021-04-30 DIAGNOSIS — J309 Allergic rhinitis, unspecified: Secondary | ICD-10-CM | POA: Diagnosis not present

## 2021-04-30 DIAGNOSIS — K760 Fatty (change of) liver, not elsewhere classified: Secondary | ICD-10-CM | POA: Insufficient documentation

## 2021-04-30 DIAGNOSIS — J4540 Moderate persistent asthma, uncomplicated: Secondary | ICD-10-CM | POA: Diagnosis not present

## 2021-05-04 DIAGNOSIS — E119 Type 2 diabetes mellitus without complications: Secondary | ICD-10-CM | POA: Diagnosis not present

## 2021-05-04 DIAGNOSIS — E782 Mixed hyperlipidemia: Secondary | ICD-10-CM | POA: Diagnosis not present

## 2021-05-05 ENCOUNTER — Other Ambulatory Visit: Payer: Self-pay

## 2021-05-05 ENCOUNTER — Telehealth (INDEPENDENT_AMBULATORY_CARE_PROVIDER_SITE_OTHER): Payer: HMO | Admitting: Psychiatry

## 2021-05-05 ENCOUNTER — Encounter (HOSPITAL_COMMUNITY): Payer: Self-pay | Admitting: Psychiatry

## 2021-05-05 DIAGNOSIS — D849 Immunodeficiency, unspecified: Secondary | ICD-10-CM | POA: Diagnosis not present

## 2021-05-05 DIAGNOSIS — R413 Other amnesia: Secondary | ICD-10-CM | POA: Insufficient documentation

## 2021-05-05 DIAGNOSIS — J4540 Moderate persistent asthma, uncomplicated: Secondary | ICD-10-CM | POA: Insufficient documentation

## 2021-05-05 DIAGNOSIS — F332 Major depressive disorder, recurrent severe without psychotic features: Secondary | ICD-10-CM | POA: Diagnosis not present

## 2021-05-05 DIAGNOSIS — G894 Chronic pain syndrome: Secondary | ICD-10-CM | POA: Diagnosis not present

## 2021-05-05 DIAGNOSIS — N1831 Chronic kidney disease, stage 3a: Secondary | ICD-10-CM | POA: Diagnosis not present

## 2021-05-05 DIAGNOSIS — F319 Bipolar disorder, unspecified: Secondary | ICD-10-CM | POA: Diagnosis not present

## 2021-05-05 DIAGNOSIS — M797 Fibromyalgia: Secondary | ICD-10-CM | POA: Diagnosis not present

## 2021-05-05 DIAGNOSIS — N3010 Interstitial cystitis (chronic) without hematuria: Secondary | ICD-10-CM | POA: Diagnosis not present

## 2021-05-05 DIAGNOSIS — R945 Abnormal results of liver function studies: Secondary | ICD-10-CM | POA: Diagnosis not present

## 2021-05-05 DIAGNOSIS — E782 Mixed hyperlipidemia: Secondary | ICD-10-CM | POA: Diagnosis not present

## 2021-05-05 DIAGNOSIS — R5382 Chronic fatigue, unspecified: Secondary | ICD-10-CM | POA: Diagnosis not present

## 2021-05-05 DIAGNOSIS — J309 Allergic rhinitis, unspecified: Secondary | ICD-10-CM | POA: Diagnosis not present

## 2021-05-05 DIAGNOSIS — E1169 Type 2 diabetes mellitus with other specified complication: Secondary | ICD-10-CM | POA: Diagnosis not present

## 2021-05-05 MED ORDER — FLUOXETINE HCL 40 MG PO CAPS: 40.0000 mg | ORAL_CAPSULE | Freq: Every day | ORAL | 2 refills | Status: DC

## 2021-05-05 MED ORDER — FLUOXETINE HCL 20 MG PO CAPS: 20.0000 mg | ORAL_CAPSULE | Freq: Every day | ORAL | 2 refills | Status: DC
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
## 2021-05-07 DIAGNOSIS — S233XXA Sprain of ligaments of thoracic spine, initial encounter: Secondary | ICD-10-CM | POA: Diagnosis not present

## 2021-05-07 DIAGNOSIS — S338XXA Sprain of other parts of lumbar spine and pelvis, initial encounter: Secondary | ICD-10-CM | POA: Diagnosis not present

## 2021-05-07 DIAGNOSIS — S134XXA Sprain of ligaments of cervical spine, initial encounter: Secondary | ICD-10-CM | POA: Diagnosis not present

## 2021-05-11 ENCOUNTER — Telehealth (HOSPITAL_COMMUNITY): Payer: HMO | Admitting: Psychiatry

## 2021-05-21 ENCOUNTER — Encounter: Payer: Self-pay | Admitting: Nurse Practitioner

## 2021-05-25 ENCOUNTER — Ambulatory Visit (INDEPENDENT_AMBULATORY_CARE_PROVIDER_SITE_OTHER): Payer: HMO | Admitting: Psychiatry

## 2021-05-25 ENCOUNTER — Encounter (HOSPITAL_COMMUNITY): Payer: Self-pay | Admitting: Psychiatry

## 2021-05-25 ENCOUNTER — Other Ambulatory Visit: Payer: Self-pay

## 2021-05-25 DIAGNOSIS — F332 Major depressive disorder, recurrent severe without psychotic features: Secondary | ICD-10-CM | POA: Diagnosis not present
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
## 2021-05-26 DIAGNOSIS — M461 Sacroiliitis, not elsewhere classified: Secondary | ICD-10-CM | POA: Diagnosis not present

## 2021-06-02 DIAGNOSIS — R350 Frequency of micturition: Secondary | ICD-10-CM | POA: Diagnosis not present

## 2021-06-03 DIAGNOSIS — E1165 Type 2 diabetes mellitus with hyperglycemia: Secondary | ICD-10-CM | POA: Diagnosis not present

## 2021-06-03 DIAGNOSIS — K219 Gastro-esophageal reflux disease without esophagitis: Secondary | ICD-10-CM | POA: Diagnosis not present

## 2021-06-11 ENCOUNTER — Ambulatory Visit (HOSPITAL_COMMUNITY): Payer: HMO | Admitting: Psychiatry

## 2021-06-11 ENCOUNTER — Ambulatory Visit (INDEPENDENT_AMBULATORY_CARE_PROVIDER_SITE_OTHER): Payer: HMO | Admitting: Psychiatry

## 2021-06-11 ENCOUNTER — Other Ambulatory Visit: Payer: Self-pay

## 2021-06-11 DIAGNOSIS — F332 Major depressive disorder, recurrent severe without psychotic features: Secondary | ICD-10-CM | POA: Diagnosis not present
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
## 2021-06-14 ENCOUNTER — Ambulatory Visit (HOSPITAL_COMMUNITY): Payer: HMO | Admitting: Psychiatry

## 2021-06-25 ENCOUNTER — Ambulatory Visit (INDEPENDENT_AMBULATORY_CARE_PROVIDER_SITE_OTHER): Payer: HMO | Admitting: Psychiatry

## 2021-06-25 ENCOUNTER — Other Ambulatory Visit: Payer: Self-pay

## 2021-06-25 DIAGNOSIS — F332 Major depressive disorder, recurrent severe without psychotic features: Secondary | ICD-10-CM | POA: Diagnosis not present
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
## 2021-06-28 DIAGNOSIS — M5412 Radiculopathy, cervical region: Secondary | ICD-10-CM | POA: Diagnosis not present

## 2021-06-28 DIAGNOSIS — Z79891 Long term (current) use of opiate analgesic: Secondary | ICD-10-CM | POA: Diagnosis not present

## 2021-06-28 DIAGNOSIS — G5620 Lesion of ulnar nerve, unspecified upper limb: Secondary | ICD-10-CM | POA: Diagnosis not present

## 2021-06-28 DIAGNOSIS — G4733 Obstructive sleep apnea (adult) (pediatric): Secondary | ICD-10-CM | POA: Diagnosis not present

## 2021-06-28 DIAGNOSIS — M47896 Other spondylosis, lumbar region: Secondary | ICD-10-CM | POA: Diagnosis not present

## 2021-06-28 DIAGNOSIS — G5603 Carpal tunnel syndrome, bilateral upper limbs: Secondary | ICD-10-CM | POA: Diagnosis not present

## 2021-06-28 DIAGNOSIS — M797 Fibromyalgia: Secondary | ICD-10-CM | POA: Diagnosis not present

## 2021-06-28 DIAGNOSIS — M542 Cervicalgia: Secondary | ICD-10-CM | POA: Diagnosis not present

## 2021-06-28 DIAGNOSIS — M545 Low back pain, unspecified: Secondary | ICD-10-CM | POA: Diagnosis not present

## 2021-07-01 ENCOUNTER — Telehealth (HOSPITAL_COMMUNITY): Payer: HMO | Admitting: Psychiatry

## 2021-07-04 DIAGNOSIS — K219 Gastro-esophageal reflux disease without esophagitis: Secondary | ICD-10-CM | POA: Diagnosis not present

## 2021-07-04 DIAGNOSIS — E1165 Type 2 diabetes mellitus with hyperglycemia: Secondary | ICD-10-CM | POA: Diagnosis not present

## 2021-07-05 ENCOUNTER — Telehealth (INDEPENDENT_AMBULATORY_CARE_PROVIDER_SITE_OTHER): Payer: HMO | Admitting: Psychiatry

## 2021-07-05 ENCOUNTER — Encounter (HOSPITAL_COMMUNITY): Payer: Self-pay | Admitting: Psychiatry

## 2021-07-05 ENCOUNTER — Other Ambulatory Visit: Payer: Self-pay

## 2021-07-05 DIAGNOSIS — F332 Major depressive disorder, recurrent severe without psychotic features: Secondary | ICD-10-CM

## 2021-07-05 MED ORDER — CLONAZEPAM 0.5 MG PO TABS: 0.5000 mg | ORAL_TABLET | Freq: Two times a day (BID) | ORAL | 2 refills | Status: DC | PRN

## 2021-07-05 MED ORDER — FLUOXETINE HCL 40 MG PO CAPS: 40.0000 mg | ORAL_CAPSULE | Freq: Every day | ORAL | 2 refills | Status: DC
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
## 2021-07-09 ENCOUNTER — Ambulatory Visit (HOSPITAL_COMMUNITY): Payer: HMO | Admitting: Psychiatry

## 2021-07-09 ENCOUNTER — Other Ambulatory Visit: Payer: Self-pay

## 2021-07-10 DIAGNOSIS — B349 Viral infection, unspecified: Secondary | ICD-10-CM | POA: Diagnosis not present

## 2021-07-10 DIAGNOSIS — Z20822 Contact with and (suspected) exposure to covid-19: Secondary | ICD-10-CM | POA: Diagnosis not present

## 2021-07-10 DIAGNOSIS — M791 Myalgia, unspecified site: Secondary | ICD-10-CM | POA: Diagnosis not present

## 2021-07-10 DIAGNOSIS — R300 Dysuria: Secondary | ICD-10-CM | POA: Diagnosis not present

## 2021-07-12 DIAGNOSIS — R5383 Other fatigue: Secondary | ICD-10-CM | POA: Diagnosis not present

## 2021-07-12 DIAGNOSIS — D839 Common variable immunodeficiency, unspecified: Secondary | ICD-10-CM | POA: Diagnosis not present

## 2021-07-12 DIAGNOSIS — R309 Painful micturition, unspecified: Secondary | ICD-10-CM | POA: Diagnosis not present

## 2021-07-12 DIAGNOSIS — B349 Viral infection, unspecified: Secondary | ICD-10-CM | POA: Insufficient documentation

## 2021-07-12 DIAGNOSIS — N1832 Chronic kidney disease, stage 3b: Secondary | ICD-10-CM | POA: Insufficient documentation

## 2021-07-12 DIAGNOSIS — E1122 Type 2 diabetes mellitus with diabetic chronic kidney disease: Secondary | ICD-10-CM | POA: Insufficient documentation

## 2021-07-12 DIAGNOSIS — E1165 Type 2 diabetes mellitus with hyperglycemia: Secondary | ICD-10-CM | POA: Diagnosis not present

## 2021-07-16 DIAGNOSIS — N1832 Chronic kidney disease, stage 3b: Secondary | ICD-10-CM | POA: Diagnosis not present

## 2021-07-23 ENCOUNTER — Other Ambulatory Visit: Payer: Self-pay

## 2021-07-23 ENCOUNTER — Ambulatory Visit (INDEPENDENT_AMBULATORY_CARE_PROVIDER_SITE_OTHER): Payer: HMO | Admitting: Psychiatry

## 2021-07-23 DIAGNOSIS — F332 Major depressive disorder, recurrent severe without psychotic features: Secondary | ICD-10-CM | POA: Diagnosis not present
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
## 2021-07-27 ENCOUNTER — Other Ambulatory Visit: Payer: Self-pay

## 2021-07-27 ENCOUNTER — Inpatient Hospital Stay (HOSPITAL_COMMUNITY): Payer: HMO | Attending: Hematology and Oncology | Admitting: Hematology and Oncology

## 2021-07-27 ENCOUNTER — Encounter (HOSPITAL_COMMUNITY): Payer: Self-pay | Admitting: Hematology and Oncology

## 2021-07-27 VITALS — BP 144/75 | HR 80 | Temp 97.1°F | Resp 18 | Ht 71.0 in | Wt 230.8 lb

## 2021-07-27 DIAGNOSIS — Z79899 Other long term (current) drug therapy: Secondary | ICD-10-CM | POA: Insufficient documentation

## 2021-07-27 DIAGNOSIS — D801 Nonfamilial hypogammaglobulinemia: Secondary | ICD-10-CM | POA: Insufficient documentation

## 2021-07-27 DIAGNOSIS — Z87891 Personal history of nicotine dependence: Secondary | ICD-10-CM | POA: Diagnosis not present

## 2021-07-27 NOTE — Addendum Note (Signed)
Addended by: Candis Musa on: 07/27/2021 10:56 AM   Modules accepted: Orders

## 2021-07-27 NOTE — Progress Notes (Signed)
[place] [phone]   Fax:([phone]) [place] NOTE  Patient Care Team: [name], MD as PCP - General (Internal Medicine) Satira Sark, MD as Consulting Physician (Cardiology) Lloyd Huger, Leonie Man, MD (Internal Medicine) Garvin Fila, MD as Consulting Physician (Neurology) Darrol Jump, MD (Inactive) as Consulting Physician (Psychiatry) Jonnie Kind, MD (Inactive) as Consulting Physician (Obstetrics and Gynecology) Susy Frizzle, PTA as Physical Therapy Assistant (Physical Therapy) Susy Frizzle, PTA as Physical Therapy Assistant (Physical Therapy) Cloria Spring, MD as Consulting Physician ([place])  Hematological/Oncological History # 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 It was not thought that she had functional disorder of the immune system. 07/09/2021: last seen by Dr. Delton Coombes at [facility]. Had not received IVIG since 2016 due to insurance issues.  07/27/2021: re-establish care with [facility]  CHIEF COMPLAINTS/PURPOSE OF CONSULTATION:  "Immunodeficiency "  HISTORY OF PRESENTING ILLNESS:  Michelle Clements 55 y.o. female with medical history significant for GERD, fibromyalgia, DM type II, and IBS who presents for evaluation of hypogammaglobulinemia.  On review of the previous records Michelle Clements was last seen by [facility] Allergy[facility]ology on 10/27/2014. At that time Dr. Lloyd Huger noted: " A review of Ms. Biddy immune evaluation reveals mild hypogammaglobulinemia (not significant), a low IgM but normal specific antibody titers, normal B cell subsets panels and normal cellular function. Taken together the immune evaluation is normal. I do not believe Ms. Kahl has a functional disorder of her immune system. For the hypo IgM this may become  significant if the IgM was less that 15. For this I suggest she have an annual IgG, IgM and IgA. Currently the IgM level is not likely to be significant".  She subsequently followed with Dr. Shelly Coss due to concern for the patient's history of hypogammaglobulinemia she was referred to hematology.   On exam today Michelle Clements is accompanied by her husband.  She notes that due to financial reasons she had to discontinue IVIG in August 2016.  This was mostly due to the fact that her co-pay was far too high.  She reports that she recently spoke with home health infusion who noted they might be able to help her get these infusions for a more reasonable cost.  She presents today in order to try to help make this happen.  On discussion today she reports that she has been having issues with recurrent infections.  She notes she has had approximately 10 infections in the last year mostly consisting of urinary tract infections and sinus infections.  She notes that she also did have a COVID infection in February 2020.  She has not had any issues with rash, pneumonia, or GI upset.  She otherwise denies any fevers, chills, sweats, nausea, vomiting or diarrhea at this time.  A full 10 point ROS is listed below.  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[place])    Depression    Essential hypertension, benign    Fibromyalgia    GERD (gastroesophageal reflux disease)    H/O sleep apnea    Hip  pain, left    History of palpitations    Negative Holter monitor   History of pneumonia 1988   Hyperlipidemia    IBS (irritable bowel syndrome)    Neck pain    Poor short term memory    PTSD (post-traumatic stress disorder)    S/P Botox injection 11/2014    For migraine headaches   Type 2 diabetes mellitus  ([place])    Urgency of urination     SURGICAL HISTORY: Past Surgical History:  Procedure Laterality Date   CARPAL TUNNEL RELEASE Right 06/10/2013   Procedure: CARPAL TUNNEL RELEASE;  Surgeon: Faythe Ghee, MD;  Location: [place] [facility];  Service: Neurosurgery;  Laterality: Right;  Right Carpal Tunnel Release    CHOLECYSTECTOMY     DENTAL SURGERY  12/2014   mole exc     x 3   MUSCLE BIOPSY  2008   Right leg   TUBAL LIGATION      SOCIAL HISTORY: Social History   Socioeconomic History   Marital status: Married    Spouse name: Not on file   Number of children: 2   Years of education: College   Highest education level: Not on file  Occupational History   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   Caffeine use: 2 cups coffee in morning and 2 cups tea during the day    Social Determinants of Health   Financial Resource Strain: Low Risk    Difficulty of Paying Living Expenses: Not very hard  Food Insecurity: No Food Insecurity   Worried About Charity fundraiser in the Last Year: Never true   Ran Out of Food in the Last Year: Never true  Transportation Needs: No Transportation Needs   Lack of Transportation (Medical): No   Lack of Transportation (Non-Medical): No  Physical Activity: Inactive   Days of Exercise per Week: 0 days   Minutes of Exercise per Session: 0 min  Stress: No Stress Concern Present   Feeling of Stress : Not at all  Social Connections: Moderately Integrated    Frequency of Communication with Friends and Family: More than three times a week   Frequency of Social Gatherings with Friends and Family: More than three times a week   Attends Religious Services: More than 4 times per year   Active Member of [employer] or Organizations: No   Attends Music therapist: Never   Marital Status: Married  Human resources officer Violence: Not At Risk   Fear of Current or Ex-Partner: No   Emotionally Abused: No   Physically Abused: No   Sexually Abused: No    FAMILY HISTORY: Family History  Problem Relation Age of Onset   Fibromyalgia Mother    Diabetes type II Mother    Depression Mother    Alzheimer's disease Mother    GER disease Mother    Heart disease Mother  Paranoid behavior Mother    Dementia Mother    Heart attack Father 4       MI x5   Stroke Father        CVA x7   Asthma Father    Brain cancer Father    Seizures Father    Anxiety disorder Sister    OCD Sister    Sexual abuse Sister    Physical abuse Sister    Anxiety disorder Sister    ADD / ADHD Daughter    ADD / ADHD Daughter    Alcohol abuse Neg Hx    Drug abuse Neg Hx    Schizophrenia Neg Hx     ALLERGIES:  is allergic to molds & smuts, other, statins, sulfa antibiotics, sulfonamide derivatives, trichophyton, carbamazepine, dust mite extract, gluten meal, and clonazepam.  MEDICATIONS:  Current Outpatient Medications  Medication Sig Dispense Refill   Ascorbic Acid (VITAMIN C) 1000 MG tablet Take 1,000 mg by mouth daily.     Blood Glucose Monitoring Suppl ([place]) w/Device KIT See admin instructions.     cetirizine (ZYRTEC) 10 MG tablet Take 10 mg by mouth at bedtime.     Cholecalciferol (VITAMIN D) 50 MCG (2000 UT) tablet Take 1,000 Units by mouth daily.     estradiol (ESTRACE) 0.1 MG/GM vaginal cream Place vaginally.     FLUoxetine (PROZAC) 40 MG capsule Take 1 capsule (40 mg total) by mouth daily. 30 capsule 2   fluticasone (FLONASE) 50 MCG/ACT  nasal spray Place 1 spray into both nostrils daily.     Glucos-Chond-Hyal Ac-Ca Fructo (MOVE FREE JOINT HEALTH ADVANCE PO) Take by mouth.     ibuprofen (ADVIL) 800 MG tablet Take 800 mg by mouth 2 (two) times daily.     insulin glargine (LANTUS SOLOSTAR) 100 UNIT/ML Solostar Pen Inject 50 Units into the skin daily.     ipratropium-albuterol (DUONEB) 0.5-2.5 (3) MG/3ML SOLN Take by nebulization.     Lancets (ONETOUCH ULTRASOFT) lancets 1 each by Other route as needed.      lubiprostone (AMITIZA) 24 MCG capsule TAKE ONE CAPSULE BY MOUTH EVERY MORNING and TAKE ONE CAPSULE BY MOUTH EVERY EVENING     Melatonin 5 MG TABS Take 5 mg by mouth at bedtime as needed.      omeprazole (PRILOSEC) 20 MG capsule Take 20 mg by mouth daily.     ONETOUCH VERIO test strip 4 (four) times daily.     pregabalin (LYRICA) 200 MG capsule Take 200 mg by mouth 3 (three) times daily.     rosuvastatin (CRESTOR) 20 MG tablet Take 1 tablet (20 mg total) by mouth at bedtime. 90 tablet 3   sennosides-docusate sodium (SENOKOT-S) 8.6-50 MG tablet Take 1 tablet by mouth daily.     sitaGLIPtin (JANUVIA) 100 MG tablet Take 1 tablet (100 mg total) by mouth daily. 90 tablet 0   tiZANidine (ZANAFLEX) 4 MG tablet Take 4 mg by mouth daily. 80m at bedtime and 2 mg during the day     vitamin B-12 (CYANOCOBALAMIN) 100 MCG tablet Take 100 mcg by mouth daily.     zinc gluconate 50 MG tablet Take 50 mg by mouth daily.     albuterol (PROVENTIL HFA;VENTOLIN HFA) 108 (90 BASE) MCG/ACT inhaler Inhale 2 puffs into the lungs every 6 (six) hours as needed for wheezing or shortness of breath. (Patient not taking: Reported on 07/27/2021)     clonazePAM (KLONOPIN) 0.5 MG tablet Take 1 tablet (0.5 mg total) by mouth  2 (two) times daily as needed for anxiety. (Patient not taking: Reported on 07/27/2021) 60 tablet 2   HYDROcodone-acetaminophen (NORCO) 10-325 MG tablet Take 1 tablet by mouth 4 (four) times daily as needed. (Patient not taking: Reported on  07/27/2021)     naproxen sodium (ALEVE) 220 MG tablet Take 220 mg by mouth 2 (two) times daily as needed. (Patient not taking: Reported on 07/27/2021)     No current facility-administered medications for this visit.    REVIEW OF SYSTEMS:   Constitutional: ( - ) fevers, ( - )  chills , ( - ) night sweats Eyes: ( - ) blurriness of vision, ( - ) double vision, ( - ) watery eyes Ears, nose, mouth, throat, and face: ( - ) mucositis, ( - ) sore throat Respiratory: ( - ) cough, ( - ) dyspnea, ( - ) wheezes Cardiovascular: ( - ) palpitation, ( - ) chest discomfort, ( - ) lower extremity swelling Gastrointestinal:  ( - ) nausea, ( - ) heartburn, ( - ) change in bowel habits Skin: ( - ) abnormal skin rashes Lymphatics: ( - ) new lymphadenopathy, ( - ) easy bruising Neurological: ( - ) numbness, ( - ) tingling, ( - ) new weaknesses Behavioral/Psych: ( - ) mood change, ( - ) new changes  All other systems were reviewed with the patient and are negative.  PHYSICAL EXAMINATION:  Vitals:   07/27/21 1008  BP: (!) 144/75  Pulse: 80  Resp: 18  Temp: (!) 97.1 F (36.2 C)  SpO2: 96%   Filed Weights   07/27/21 1008  Weight: 230 lb 12.8 oz (104.7 kg)    GENERAL: well appearing middle-aged Caucasian female in no acute distress in NAD  SKIN: skin color, texture, turgor are normal, no rashes or significant lesions EYES: conjunctiva are pink and non-injected, sclera clear LUNGS: clear to auscultation and percussion with normal breathing effort HEART: regular rate & rhythm and no murmurs and no lower extremity edema Musculoskeletal: no cyanosis of digits and no clubbing  PSYCH: alert & oriented x 3, fluent speech NEURO: no focal motor/sensory deficits  LABORATORY DATA:  I have reviewed the data as listed CBC Latest Ref Rng & Units 07/03/2019 12/26/2018 08/17/2018  WBC 4.0 - 10.5 K/uL 9.0 7.6 7.2  Hemoglobin 12.0 - 15.0 g/dL 14.7 13.2 13.6  Hematocrit 36.0 - 46.0 % 42.8 38.5 39.2  Platelets 150 -  400 K/uL 288 318 330    CMP Latest Ref Rng & Units 04/14/2021 12/29/2020 09/14/2020  Glucose 70 - 99 mg/dL - - -  BUN 4 - 21 27(A) 19 -  Creatinine 0.5 - 1.1 1.4(A) 1.4(A) -  Sodium 137 - 147 142 141 -  Potassium 3.4 - 5.3 4.0 4.2 5.3  Chloride 99 - 108 100 102 -  CO2 13 - 22 27(A) 27(A) -  Calcium 8.7 - 10.7 10.0 10.2 10.3  Total Protein 6.5 - 8.1 g/dL - - -  Total Bilirubin 0.3 - 1.2 mg/dL - - -  Alkaline Phos 25 - 125 66 61 -  AST 13 - 35 56(A) 23 -  ALT 7 - 35 108(A) 26 -    RADIOGRAPHIC STUDIES: No results found.  ASSESSMENT & PLAN Michelle Clements 54 y.o. female with medical history significant for GERD, fibromyalgia, DM type II, and IBS who presents for evaluation of hypogammaglobulinemia.  After review of the labs, review of the records, and discussion with the patient the patients findings are most consistent with  hypogammaglobulinemia and recurrent infections.  The patient does have a mild decrease in IVIG levels which may be contributing to her frequent recurrent infections.  As such I do believe would be reasonable to consider restarting her IVIG therapy.  The contact information for this patient's home infusion would be [facility].  Her contact there is Mali Sandy who can be reached at [phone].  The fax number there is [phone].  His email is [email or web address].  #Hypogammaglobulinemia -- Patient has a modest hypogammaglobulinemia but does have recurrent infections.  I think she would be a reasonable candidate for IVIG --We will work with the patient, her insurance, and her home health infusion company to see if this would be feasible. --Would plan for infusions q. 28 days with every 3 month clinic visit follow-up.  Orders Placed This Encounter  Procedures   CBC with Differential ([place] Only)    Standing Status:   Future    Standing Expiration Date:   07/27/2022   CMP ([place] only)    Standing Status:   Future    Standing  Expiration Date:   07/27/2022   QIG  (Quant. immunoglobulins  - IgG, IgA, IgM)    Standing Status:   Future    Number of Occurrences:   1    Standing Expiration Date:   07/27/2022   Comprehensive metabolic panel    Standing Status:   Future    Number of Occurrences:   1    Standing Expiration Date:   07/27/2022    Order Specific Question:   Release to patient    Answer:   Immediate   CBC with Differential/Platelet    Standing Status:   Future    Number of Occurrences:   1    Standing Expiration Date:   07/27/2022    Order Specific Question:   Release to patient    Answer:   Immediate   Comprehensive metabolic panel    Standing Status:   Future    Number of Occurrences:   1    Standing Expiration Date:   07/27/2022    Order Specific Question:   Release to patient    Answer:   Immediate   CBC with Differential/Platelet    Standing Status:   Future    Number of Occurrences:   1    Standing Expiration Date:   07/27/2022    Order Specific Question:   Release to patient    Answer:   Immediate   IgG, IgA, IgM    Standing Status:   Future    Number of Occurrences:   1    Standing Expiration Date:   07/27/2022    Order Specific Question:   Release to patient    Answer:   Immediate    All questions were answered. The patient knows to call the clinic with any problems, questions or concerns.  A total of more than 60 minutes were spent on this encounter with face-to-face time and non-face-to-face time, including preparing to see the patient, ordering tests and/or medications, counseling the patient and coordination of care as outlined above.   Ledell Peoples, MD Department of Hematology/Oncology [place] at [facility] Phone: [phone] Pager: [phone] Email: [email or web address]  07/27/2021 4:50 PM

## 2021-08-03 DIAGNOSIS — M4802 Spinal stenosis, cervical region: Secondary | ICD-10-CM | POA: Diagnosis not present

## 2021-08-03 DIAGNOSIS — M797 Fibromyalgia: Secondary | ICD-10-CM | POA: Diagnosis not present

## 2021-08-03 DIAGNOSIS — M461 Sacroiliitis, not elsewhere classified: Secondary | ICD-10-CM | POA: Diagnosis not present

## 2021-08-03 DIAGNOSIS — M47816 Spondylosis without myelopathy or radiculopathy, lumbar region: Secondary | ICD-10-CM | POA: Diagnosis not present

## 2021-08-04 ENCOUNTER — Encounter: Payer: Self-pay | Admitting: Hematology and Oncology

## 2021-08-04 ENCOUNTER — Telehealth: Payer: Self-pay | Admitting: *Deleted

## 2021-08-04 DIAGNOSIS — E1165 Type 2 diabetes mellitus with hyperglycemia: Secondary | ICD-10-CM | POA: Diagnosis not present

## 2021-08-04 DIAGNOSIS — K219 Gastro-esophageal reflux disease without esophagitis: Secondary | ICD-10-CM | POA: Diagnosis not present

## 2021-08-04 NOTE — Telephone Encounter (Signed)
Received call from Mali Sandy @ [facility]. Pt had reached out to them for possible home infusion of IVIG. He would to talk to Dr. Marthann Schiller nurse about the referral process. He can be called at [phone] or emailed at [email or web address]

## 2021-08-05 ENCOUNTER — Encounter (HOSPITAL_COMMUNITY): Payer: Self-pay

## 2021-08-05 DIAGNOSIS — I10 Essential (primary) hypertension: Secondary | ICD-10-CM | POA: Diagnosis not present

## 2021-08-06 ENCOUNTER — Encounter (HOSPITAL_COMMUNITY): Payer: Self-pay | Admitting: Psychiatry

## 2021-08-06 ENCOUNTER — Telehealth (HOSPITAL_COMMUNITY): Payer: Self-pay | Admitting: Psychiatry

## 2021-08-06 ENCOUNTER — Other Ambulatory Visit: Payer: Self-pay

## 2021-08-06 ENCOUNTER — Telehealth (INDEPENDENT_AMBULATORY_CARE_PROVIDER_SITE_OTHER): Payer: HMO | Admitting: Psychiatry

## 2021-08-06 DIAGNOSIS — S233XXA Sprain of ligaments of thoracic spine, initial encounter: Secondary | ICD-10-CM | POA: Diagnosis not present

## 2021-08-06 DIAGNOSIS — S338XXA Sprain of other parts of lumbar spine and pelvis, initial encounter: Secondary | ICD-10-CM | POA: Diagnosis not present

## 2021-08-06 DIAGNOSIS — F332 Major depressive disorder, recurrent severe without psychotic features: Secondary | ICD-10-CM | POA: Diagnosis not present

## 2021-08-06 DIAGNOSIS — F319 Bipolar disorder, unspecified: Secondary | ICD-10-CM

## 2021-08-06 DIAGNOSIS — S134XXA Sprain of ligaments of cervical spine, initial encounter: Secondary | ICD-10-CM | POA: Diagnosis not present

## 2021-08-06 LAB — LIPID PANEL
Cholesterol: 196 (ref 0–200)
HDL: 54 (ref 35–70)
LDL Cholesterol: 117
Triglycerides: 144 (ref 40–160)

## 2021-08-06 LAB — HEMOGLOBIN A1C: Hemoglobin A1C: 5.9

## 2021-08-06 LAB — COMPREHENSIVE METABOLIC PANEL
Albumin: 5 (ref 3.5–5.0)
Calcium: 10.8 — AB (ref 8.7–10.7)
Globulin: 1.7

## 2021-08-06 LAB — BASIC METABOLIC PANEL
BUN: 16 (ref 4–21)
CO2: 26 — AB (ref 13–22)
Chloride: 102 (ref 99–108)
Creatinine: 1.4 — AB (ref 0.5–1.1)
Glucose: 92
Potassium: 5 (ref 3.4–5.3)
Sodium: 143 (ref 137–147)

## 2021-08-06 LAB — HEPATIC FUNCTION PANEL
ALT: 28 (ref 7–35)
AST: 28 (ref 13–35)
Alkaline Phosphatase: 78 (ref 25–125)
Bilirubin, Total: 0.6

## 2021-08-06 MED ORDER — FLUOXETINE HCL 40 MG PO CAPS: 40.0000 mg | ORAL_CAPSULE | Freq: Every day | ORAL | 2 refills | Status: DC

## 2021-08-06 MED ORDER — BUPROPION HCL ER (XL) 150 MG PO TB24
150.0000 mg | ORAL_TABLET | ORAL | 2 refills | Status: DC
Start: 2021-08-06 — End: 2021-09-30

## 2021-08-06 MED ORDER — CLONAZEPAM 1 MG PO TABS: 1.0000 mg | ORAL_TABLET | Freq: Two times a day (BID) | ORAL | 2 refills | Status: DC | PRN

## 2021-08-06 NOTE — Progress Notes (Signed)
Virtual Visit via Telephone Note  I connected with Michelle Clements on 08/06/21 at  9:20 AM EDT by telephone and verified that I am speaking with the correct person using two identifiers.  Location: Patient: home Provider: home office   I discussed the limitations, risks, security and privacy concerns of performing an evaluation and management service by telephone and the availability of in person appointments. I also discussed with the patient that there may be a patient responsible charge related to this service. The patient expressed understanding and agreed to proceed.       I discussed the assessment and treatment plan with the patient. The patient was provided an opportunity to ask questions and all were answered. The patient agreed with the plan and demonstrated an understanding of the instructions.   The patient was advised to call back or seek an in-person evaluation if the symptoms worsen or if the condition fails to improve as anticipated.  I provided 30 minutes of non-face-to-face time during this encounter.   [name], MD  [facility] MD/PA/NP OP Progress Note  08/06/2021 9:57 AM Michelle Clements  MRN:  [id number]  Chief Complaint:  Chief Complaint   Depression; Anxiety; Follow-up    HPI: This patient is a 55 year old married white female who lives with her husband r in [place]. She has two daughters  who live out of the home. 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  The patient and husband were return after 4 weeks.  The patient is not doing at all well.  She states that she has become increasingly depressed.  Her mother is in a nursing home and is slowly dying of dementia.  Apparently hospice is now involved.  The patient has power of attorney.  Her other 2 sisters are not very involved.  She states that she is gone way downhill.  She is sleeping all the time and just staying in the bed.  She does not feel like doing anything.  She is overly focused on negative things  about herself.  She feels like she has been a bad daughter and a bad wife.  She is perseverating about this today and was very tearful and distraught.  I spoke to her husband as well who states that this depression has been getting worse prior to her mother's decline.  I explained to the patient that we could try adding medications but she also seems to need some support.  She is seeing a therapist Vickii Chafe Bynum every 2 weeks but is probably not enough.  I will refer her to intensive outpatient treatment.  The patient denies any thoughts of self-harm or suicide.  She is eating and functioning at a basic level so I do not think she needs hospitalization at this point.  However given that she has been declining this may be a possibility in the near future. Visit Diagnosis:    ICD-10-CM   1. Major depressive disorder, recurrent, severe without psychotic features ([place])  F33.2     2. Bipolar 1 disorder (HCC)  F31.9 clonazePAM (KLONOPIN) 1 MG tablet      Past Psychiatric History: Admitted for detox several years ago.  She has had past outpatient treatment  Past Medical History:  Past Medical History:  Diagnosis Date   Allergic rhinitis    Anemia    Arthritis    Lower back and hips   Asthma    Bipolar disorder ([place])    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[place])  Constipation    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HCC)    Depression    Essential hypertension, benign    Fibromyalgia    GERD (gastroesophageal reflux disease)    H/O sleep apnea    Hip pain, left    History of palpitations    Negative Holter monitor   History of pneumonia 1988   Hyperlipidemia    IBS (irritable bowel syndrome)    Neck pain    Poor short term memory    PTSD (post-traumatic stress disorder)    S/P Botox injection 11/2014    For migraine headaches   Type 2 diabetes mellitus ([place])    Urgency of urination     Past Surgical History:  Procedure Laterality Date   CARPAL TUNNEL RELEASE Right 06/10/2013    Procedure: CARPAL TUNNEL RELEASE;  Surgeon: Faythe Ghee, MD;  Location: [facility];  Service: Neurosurgery;  Laterality: Right;  Right Carpal Tunnel Release    CHOLECYSTECTOMY     DENTAL SURGERY  12/2014   mole exc     x 3   MUSCLE BIOPSY  2008   Right leg   TUBAL LIGATION      Family Psychiatric History: see below  Family History:  Family History  Problem Relation Age of Onset   Fibromyalgia Mother    Diabetes type II Mother    Depression Mother    Alzheimer's disease Mother    GER disease Mother    Heart disease Mother    Paranoid behavior Mother    Dementia Mother    Heart attack Father 68       MI x5   Stroke Father        CVA x7   Asthma Father    Brain cancer Father    Seizures Father    Anxiety disorder Sister    OCD Sister    Sexual abuse Sister    Physical abuse Sister    Anxiety disorder Sister    ADD / ADHD Daughter    ADD / ADHD Daughter    Alcohol abuse Neg Hx    Drug abuse Neg Hx    Schizophrenia Neg Hx     Social History:  Social History   Socioeconomic History   Marital status: Married    Spouse name: Not on file   Number of children: 2   Years of education: College   Highest education level: Not on file  Occupational History   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   Caffeine use: 2 cups coffee in morning and 2 cups tea during the day    Social Determinants of Health   Financial Resource Strain: Low Risk    Difficulty of Paying Living  Expenses: Not very hard  Food Insecurity: No Food Insecurity   Worried About Running Out of Food in the Last Year: Never true   Ran Out of  Food in the Last Year: Never true  Transportation Needs: No Transportation Needs   Lack of Transportation (Medical): No   Lack of Transportation (Non-Medical): No  Physical Activity: Inactive   Days of Exercise per Week: 0 days   Minutes of Exercise per Session: 0 min  Stress: No Stress Concern Present   Feeling of Stress : Not at all  Social Connections: Moderately Integrated   Frequency of Communication with Friends and Family: More than three times a week   Frequency of Social Gatherings with Friends and Family: More than three times a week   Attends Religious Services: More than 4 times per year   Active Member of [employer] or Organizations: No   Attends Archivist Meetings: Never   Marital Status: Married    Allergies:  Allergies  Allergen Reactions   Molds & Smuts Shortness Of Breath    wheezing   Other Anaphylaxis, Shortness Of Breath and Other (See Comments)     : Angioedema wheezing Wheezing wheezing Other reaction(s): Cough Wheezing Itching and rash Wheezing wheezing Wheezing wheezing   Statins Anaphylaxis   Sulfa Antibiotics Anaphylaxis     : Angioedema    Sulfonamide Derivatives Anaphylaxis     : Angioedema   Trichophyton Shortness Of Breath    wheezing wheezing wheezing   Carbamazepine Rash   Dust Mite Extract Cough    Wheezing Other reaction(s): Wheezing Wheezing   Gluten Meal Itching   Clonazepam Other (See Comments)    Confusion and memory loss Caused nconfusion    Metabolic Disorder Labs: Lab Results  Component Value Date   HGBA1C 5.4 04/21/2021   MPG 131 03/18/2015   No results found for: PROLACTIN Lab Results  Component Value Date   CHOL 134 12/29/2020   TRIG 185 (A) 12/29/2020   HDL 39 12/29/2020   CHOLHDL 4.2 03/18/2015   VLDL 44 (H) 03/18/2015   LDLCALC 64 12/29/2020   LDLCALC 89  08/26/2020   Lab Results  Component Value Date   TSH 2.69 12/29/2020   TSH 1.66 09/14/2020    Therapeutic Level Labs: Lab Results  Component Value Date   LITHIUM 0.20 (L) 02/21/2012   No results found for: VALPROATE No components found for:  CBMZ  Current Medications: Current Outpatient Medications  Medication Sig Dispense Refill   buPROPion (WELLBUTRIN XL) 150 MG 24 hr tablet Take 1 tablet (150 mg total) by mouth every morning. 30 tablet 2   albuterol (PROVENTIL HFA;VENTOLIN HFA) 108 (90 BASE) MCG/ACT inhaler Inhale 2 puffs into the lungs every 6 (six) hours as needed for wheezing or shortness of breath. (Patient not taking: Reported on 07/27/2021)     Ascorbic Acid (VITAMIN C) 1000 MG tablet Take 1,000 mg by mouth daily.     Blood Glucose Monitoring Suppl ([place]) w/Device KIT See admin instructions.     cetirizine (ZYRTEC) 10 MG tablet Take 10 mg by mouth at bedtime.     Cholecalciferol (VITAMIN D) 50 MCG (2000 UT) tablet Take 1,000 Units by mouth daily.     clonazePAM (KLONOPIN) 0.5 MG tablet Take 1 tablet (0.5 mg total) by mouth 2 (two) times daily as needed for anxiety. (Patient not taking: Reported on 07/27/2021) 60 tablet 2   clonazePAM (KLONOPIN) 1 MG tablet Take 1 tablet (1 mg total) by mouth 2 (two) times daily as needed for anxiety. 60 tablet 2   estradiol (ESTRACE) 0.1 MG/GM vaginal cream Place vaginally.     FLUoxetine (PROZAC) 40 MG capsule  Take 1 capsule (40 mg total) by mouth daily. 30 capsule 2   fluticasone (FLONASE) 50 MCG/ACT nasal spray Place 1 spray into both nostrils daily.     Glucos-Chond-Hyal Ac-Ca Fructo (MOVE FREE JOINT HEALTH ADVANCE PO) Take by mouth.     HYDROcodone-acetaminophen (NORCO) 10-325 MG tablet Take 1 tablet by mouth 4 (four) times daily as needed. (Patient not taking: Reported on 07/27/2021)     ibuprofen (ADVIL) 800 MG tablet Take 800 mg by mouth 2 (two) times daily.     insulin glargine (LANTUS SOLOSTAR) 100 UNIT/ML  Solostar Pen Inject 50 Units into the skin daily.     ipratropium-albuterol (DUONEB) 0.5-2.5 (3) MG/3ML SOLN Take by nebulization.     Lancets (ONETOUCH ULTRASOFT) lancets 1 each by Other route as needed.      lubiprostone (AMITIZA) 24 MCG capsule TAKE ONE CAPSULE BY MOUTH EVERY MORNING and TAKE ONE CAPSULE BY MOUTH EVERY EVENING     Melatonin 5 MG TABS Take 5 mg by mouth at bedtime as needed.      naproxen sodium (ALEVE) 220 MG tablet Take 220 mg by mouth 2 (two) times daily as needed. (Patient not taking: Reported on 07/27/2021)     omeprazole (PRILOSEC) 20 MG capsule Take 20 mg by mouth daily.     ONETOUCH VERIO test strip 4 (four) times daily.     pregabalin (LYRICA) 200 MG capsule Take 200 mg by mouth 3 (three) times daily.     rosuvastatin (CRESTOR) 20 MG tablet Take 1 tablet (20 mg total) by mouth at bedtime. 90 tablet 3   sennosides-docusate sodium (SENOKOT-S) 8.6-50 MG tablet Take 1 tablet by mouth daily.     sitaGLIPtin (JANUVIA) 100 MG tablet Take 1 tablet (100 mg total) by mouth daily. 90 tablet 0   tiZANidine (ZANAFLEX) 4 MG tablet Take 4 mg by mouth daily. 9m at bedtime and 2 mg during the day     vitamin B-12 (CYANOCOBALAMIN) 100 MCG tablet Take 100 mcg by mouth daily.     zinc gluconate 50 MG tablet Take 50 mg by mouth daily.     No current facility-administered medications for this visit.     Musculoskeletal: Strength & Muscle Tone: within normal limits Gait & Station: normal Patient leans: N/A  Psychiatric Specialty Exam: Review of Systems  Constitutional:  Positive for fatigue.  Musculoskeletal:  Positive for arthralgias and myalgias.  Psychiatric/Behavioral:  Positive for dysphoric mood and hallucinations. The patient is nervous/anxious.   All other systems reviewed and are negative.  Last menstrual period 01/04/2018.There is no height or weight on file to calculate BMI.  General Appearance: Casual and Fairly Groomed  Eye Contact:  Good  Speech:  Clear and  Coherent  Volume:  Increased  Mood:  Anxious, Depressed, Hopeless, and Worthless  Affect:  Labile and Tearful  Thought Process:  Goal Directed  Orientation:  Full (Time, Place, and Person)  Thought Content: Hallucinations: Auditory hears music  Suicidal Thoughts:  No  Homicidal Thoughts:  No  Memory:  Immediate;   Good Recent;   Good Remote;   Good  Judgement:  Fair  Insight:  Fair  Psychomotor Activity:  Decreased  Concentration:  Concentration: Poor and Attention Span: Poor  Recall:  Good  Fund of Knowledge: Good  Language: Good  Akathisia:  No  Handed:  Right  AIMS (if indicated): not done  Assets:  Communication Skills Desire for Improvement Physical Health Resilience Social Support  ADL's:  Intact  Cognition: WNL  Sleep:  Good   Screenings: Mini-Mental    Flowsheet Row Office Visit from 08/29/2019 in [facility]  Total Score (max 30 points ) 27      PHQ2-9    Flowsheet Row Video Visit from 08/06/2021 in [place] Office Visit from 07/27/2021 in [place] Video Visit from 07/05/2021 in [facility] [facility] from 05/25/2021 in [place] [facility] Video Visit from 05/05/2021 in [place] [facility]  PHQ-2 Total Score 6 0 0 3 3  PHQ-9 Total Score 13 -- -- 14 10      Flowsheet Row Video Visit from 08/06/2021 in [place] [facility] Video Visit from 07/05/2021 in [place] [facility] from 05/25/2021 in [place] [facility]  C-SSRS RISK CATEGORY Error: Question 6 not populated Error: Question 6 not populated No Risk        Assessment and Plan: Patient is a 55 year old female with a history of PTSD depression anxiety hallucinations and nightmares.  She seems to be doing worse given  her mother's decline.  She is having a very difficult time handling this and seems to be extremely distraught today.  She does not feel like she needs to go into the hospital but I will refer her to the intensive outpatient program.  Her husband is there with her and she denies suicidal ideation or intent.  We will add Wellbutrin XL 150 mg daily to her regimen to help energy, continue Prozac 40 mg daily and increase clonazepam to 1 mg twice daily for anxiety.  She will return to see me next week so we can make sure she is on the right track we talked about adding mood stabilizer such as Abilify but she has had negative effects to this in the past.  She is also been given information about calling 911 or going to the behavioral health urgent care center in [place] if her condition worsens   [name], MD 08/06/2021, 9:57 AM

## 2021-08-06 NOTE — Telephone Encounter (Signed)
D:  Dr. Harrington Challenger referred pt to [facility].  A:  Placed call to orient pt and provide her with a start date; but according to pt's husband, pt was resting/taking a nap.  Informed Mr. Desch that case manager will contact her on 08-10-21 since [facility] is closed on Labor Day.  Inform Dr. Harrington Challenger.  R:  Husband receptive.

## 2021-08-09 DIAGNOSIS — N183 Chronic kidney disease, stage 3 unspecified: Secondary | ICD-10-CM | POA: Diagnosis not present

## 2021-08-09 DIAGNOSIS — G8929 Other chronic pain: Secondary | ICD-10-CM | POA: Diagnosis not present

## 2021-08-09 DIAGNOSIS — R0902 Hypoxemia: Secondary | ICD-10-CM | POA: Diagnosis not present

## 2021-08-09 DIAGNOSIS — T391X2D Poisoning by 4-Aminophenol derivatives, intentional self-harm, subsequent encounter: Secondary | ICD-10-CM | POA: Diagnosis not present

## 2021-08-09 DIAGNOSIS — Z882 Allergy status to sulfonamides status: Secondary | ICD-10-CM | POA: Diagnosis not present

## 2021-08-09 DIAGNOSIS — F4320 Adjustment disorder, unspecified: Secondary | ICD-10-CM | POA: Diagnosis not present

## 2021-08-09 DIAGNOSIS — F4310 Post-traumatic stress disorder, unspecified: Secondary | ICD-10-CM | POA: Diagnosis not present

## 2021-08-09 DIAGNOSIS — F05 Delirium due to known physiological condition: Secondary | ICD-10-CM | POA: Diagnosis not present

## 2021-08-09 DIAGNOSIS — T50904A Poisoning by unspecified drugs, medicaments and biological substances, undetermined, initial encounter: Secondary | ICD-10-CM | POA: Diagnosis not present

## 2021-08-09 DIAGNOSIS — N189 Chronic kidney disease, unspecified: Secondary | ICD-10-CM | POA: Diagnosis not present

## 2021-08-09 DIAGNOSIS — Z9049 Acquired absence of other specified parts of digestive tract: Secondary | ICD-10-CM | POA: Diagnosis not present

## 2021-08-09 DIAGNOSIS — I129 Hypertensive chronic kidney disease with stage 1 through stage 4 chronic kidney disease, or unspecified chronic kidney disease: Secondary | ICD-10-CM | POA: Diagnosis not present

## 2021-08-09 DIAGNOSIS — G4733 Obstructive sleep apnea (adult) (pediatric): Secondary | ICD-10-CM | POA: Diagnosis not present

## 2021-08-09 DIAGNOSIS — T391X2A Poisoning by 4-Aminophenol derivatives, intentional self-harm, initial encounter: Secondary | ICD-10-CM | POA: Diagnosis not present

## 2021-08-09 DIAGNOSIS — T1491XA Suicide attempt, initial encounter: Secondary | ICD-10-CM | POA: Diagnosis not present

## 2021-08-09 DIAGNOSIS — Z794 Long term (current) use of insulin: Secondary | ICD-10-CM | POA: Diagnosis not present

## 2021-08-09 DIAGNOSIS — G309 Alzheimer's disease, unspecified: Secondary | ICD-10-CM | POA: Diagnosis not present

## 2021-08-09 DIAGNOSIS — Z87891 Personal history of nicotine dependence: Secondary | ICD-10-CM | POA: Diagnosis not present

## 2021-08-09 DIAGNOSIS — R109 Unspecified abdominal pain: Secondary | ICD-10-CM | POA: Diagnosis not present

## 2021-08-09 DIAGNOSIS — F32A Depression, unspecified: Secondary | ICD-10-CM | POA: Diagnosis not present

## 2021-08-09 DIAGNOSIS — E1140 Type 2 diabetes mellitus with diabetic neuropathy, unspecified: Secondary | ICD-10-CM | POA: Diagnosis not present

## 2021-08-09 DIAGNOSIS — T40602A Poisoning by unspecified narcotics, intentional self-harm, initial encounter: Secondary | ICD-10-CM | POA: Diagnosis not present

## 2021-08-09 DIAGNOSIS — T887XXA Unspecified adverse effect of drug or medicament, initial encounter: Secondary | ICD-10-CM | POA: Diagnosis not present

## 2021-08-09 DIAGNOSIS — R45851 Suicidal ideations: Secondary | ICD-10-CM | POA: Diagnosis not present

## 2021-08-09 DIAGNOSIS — K5900 Constipation, unspecified: Secondary | ICD-10-CM | POA: Diagnosis not present

## 2021-08-09 DIAGNOSIS — E782 Mixed hyperlipidemia: Secondary | ICD-10-CM | POA: Diagnosis not present

## 2021-08-09 DIAGNOSIS — N200 Calculus of kidney: Secondary | ICD-10-CM | POA: Diagnosis not present

## 2021-08-09 DIAGNOSIS — T50901A Poisoning by unspecified drugs, medicaments and biological substances, accidental (unintentional), initial encounter: Secondary | ICD-10-CM | POA: Diagnosis not present

## 2021-08-09 DIAGNOSIS — R4182 Altered mental status, unspecified: Secondary | ICD-10-CM | POA: Diagnosis not present

## 2021-08-09 DIAGNOSIS — I700 Atherosclerosis of aorta: Secondary | ICD-10-CM | POA: Diagnosis not present

## 2021-08-09 DIAGNOSIS — Z79899 Other long term (current) drug therapy: Secondary | ICD-10-CM | POA: Diagnosis not present

## 2021-08-09 DIAGNOSIS — E1122 Type 2 diabetes mellitus with diabetic chronic kidney disease: Secondary | ICD-10-CM | POA: Diagnosis not present

## 2021-08-09 DIAGNOSIS — M797 Fibromyalgia: Secondary | ICD-10-CM | POA: Diagnosis not present

## 2021-08-09 DIAGNOSIS — T40604A Poisoning by unspecified narcotics, undetermined, initial encounter: Secondary | ICD-10-CM | POA: Diagnosis not present

## 2021-08-09 DIAGNOSIS — F319 Bipolar disorder, unspecified: Secondary | ICD-10-CM | POA: Diagnosis not present

## 2021-08-10 ENCOUNTER — Telehealth (HOSPITAL_COMMUNITY): Payer: Self-pay | Admitting: Psychiatry

## 2021-08-10 DIAGNOSIS — M797 Fibromyalgia: Secondary | ICD-10-CM | POA: Diagnosis not present

## 2021-08-10 DIAGNOSIS — N183 Chronic kidney disease, stage 3 unspecified: Secondary | ICD-10-CM | POA: Diagnosis not present

## 2021-08-10 DIAGNOSIS — Z794 Long term (current) use of insulin: Secondary | ICD-10-CM | POA: Diagnosis not present

## 2021-08-10 DIAGNOSIS — E1122 Type 2 diabetes mellitus with diabetic chronic kidney disease: Secondary | ICD-10-CM | POA: Diagnosis not present

## 2021-08-10 DIAGNOSIS — F319 Bipolar disorder, unspecified: Secondary | ICD-10-CM | POA: Diagnosis not present

## 2021-08-10 DIAGNOSIS — F329 Major depressive disorder, single episode, unspecified: Secondary | ICD-10-CM | POA: Diagnosis not present

## 2021-08-10 DIAGNOSIS — T50902A Poisoning by unspecified drugs, medicaments and biological substances, intentional self-harm, initial encounter: Secondary | ICD-10-CM | POA: Diagnosis not present

## 2021-08-10 DIAGNOSIS — R45851 Suicidal ideations: Secondary | ICD-10-CM | POA: Diagnosis not present

## 2021-08-10 DIAGNOSIS — T391X2A Poisoning by 4-Aminophenol derivatives, intentional self-harm, initial encounter: Secondary | ICD-10-CM | POA: Diagnosis not present

## 2021-08-10 DIAGNOSIS — T1491XA Suicide attempt, initial encounter: Secondary | ICD-10-CM | POA: Diagnosis not present

## 2021-08-10 NOTE — Telephone Encounter (Signed)
D:  Placed second call to pt, but she wasn't available according to her husband.  Husband states that pt's mother passed.  A:  Expressed condolences to the family.  Informed Dr. Harrington Challenger about pt's recent loss.

## 2021-08-11 DIAGNOSIS — N183 Chronic kidney disease, stage 3 unspecified: Secondary | ICD-10-CM | POA: Diagnosis not present

## 2021-08-11 DIAGNOSIS — M797 Fibromyalgia: Secondary | ICD-10-CM | POA: Diagnosis not present

## 2021-08-11 DIAGNOSIS — F319 Bipolar disorder, unspecified: Secondary | ICD-10-CM | POA: Diagnosis not present

## 2021-08-11 DIAGNOSIS — T50902D Poisoning by unspecified drugs, medicaments and biological substances, intentional self-harm, subsequent encounter: Secondary | ICD-10-CM | POA: Insufficient documentation

## 2021-08-11 DIAGNOSIS — E1122 Type 2 diabetes mellitus with diabetic chronic kidney disease: Secondary | ICD-10-CM | POA: Diagnosis not present

## 2021-08-11 DIAGNOSIS — G8929 Other chronic pain: Secondary | ICD-10-CM | POA: Diagnosis not present

## 2021-08-11 DIAGNOSIS — T391X2D Poisoning by 4-Aminophenol derivatives, intentional self-harm, subsequent encounter: Secondary | ICD-10-CM | POA: Diagnosis not present

## 2021-08-11 DIAGNOSIS — Z794 Long term (current) use of insulin: Secondary | ICD-10-CM | POA: Diagnosis not present

## 2021-08-12 ENCOUNTER — Telehealth (HOSPITAL_COMMUNITY): Payer: Self-pay | Admitting: Psychiatry

## 2021-08-12 DIAGNOSIS — K5900 Constipation, unspecified: Secondary | ICD-10-CM | POA: Diagnosis not present

## 2021-08-13 ENCOUNTER — Other Ambulatory Visit: Payer: Self-pay

## 2021-08-13 ENCOUNTER — Telehealth (HOSPITAL_COMMUNITY): Payer: Self-pay | Admitting: Psychiatry

## 2021-08-13 ENCOUNTER — Ambulatory Visit (HOSPITAL_COMMUNITY): Payer: HMO | Admitting: Psychiatry

## 2021-08-13 ENCOUNTER — Encounter (HOSPITAL_COMMUNITY): Payer: Self-pay | Admitting: Hematology & Oncology

## 2021-08-13 NOTE — Telephone Encounter (Signed)
Therapist attempted to contact patient for scheduled appointment.  Patient's husband informed therapist patient is in [facility] in [place].  Per his report, patient attempted to harm herself this past Monday and this was triggered by the recent death of patient's mother.  Therapist expressed condolences.

## 2021-08-17 ENCOUNTER — Telehealth (HOSPITAL_COMMUNITY): Payer: HMO | Admitting: Psychiatry

## 2021-08-17 DIAGNOSIS — R410 Disorientation, unspecified: Secondary | ICD-10-CM | POA: Diagnosis not present

## 2021-08-17 DIAGNOSIS — T1491XD Suicide attempt, subsequent encounter: Secondary | ICD-10-CM | POA: Diagnosis not present

## 2021-08-17 DIAGNOSIS — G4733 Obstructive sleep apnea (adult) (pediatric): Secondary | ICD-10-CM | POA: Diagnosis not present

## 2021-08-19 ENCOUNTER — Other Ambulatory Visit: Payer: Self-pay

## 2021-08-19 ENCOUNTER — Telehealth (HOSPITAL_COMMUNITY): Payer: Self-pay | Admitting: Psychiatry

## 2021-08-19 ENCOUNTER — Encounter (HOSPITAL_COMMUNITY): Payer: Self-pay | Admitting: Psychiatry

## 2021-08-19 ENCOUNTER — Telehealth (INDEPENDENT_AMBULATORY_CARE_PROVIDER_SITE_OTHER): Payer: HMO | Admitting: Psychiatry

## 2021-08-19 DIAGNOSIS — F332 Major depressive disorder, recurrent severe without psychotic features: Secondary | ICD-10-CM

## 2021-08-19 DIAGNOSIS — F319 Bipolar disorder, unspecified: Secondary | ICD-10-CM | POA: Diagnosis not present

## 2021-08-19 NOTE — Telephone Encounter (Signed)
D:  Returned pt's call re: [facility].  A:  Oriented pt.  Encouraged pt to call to verify her benefits.  Pt states she has upcoming doctor's appts the beginning of the week; so she will start on 08-26-21.  Inform Dr. Harrington Challenger.  R:  Pt receptive.

## 2021-08-19 NOTE — Telephone Encounter (Signed)
D:  Dr. Harrington Challenger requested that case manager reach back out to pt re: [facility].  A:  Placed call to pt, but there was no answer.  Left vm requesting pt to call the case manager back.  Inform Dr. Harrington Challenger.
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
## 2021-08-24 ENCOUNTER — Telehealth (HOSPITAL_COMMUNITY): Payer: Self-pay | Admitting: *Deleted

## 2021-08-24 NOTE — Telephone Encounter (Signed)
Patient called stating she would like to know when she can stop having someone being with her around the clock.   Per pt she wants provider to call her because she wants to speak with her. [phone]

## 2021-08-24 NOTE — Patient Instructions (Signed)
Diabetes Mellitus and Nutrition, Adult When you have diabetes, or diabetes mellitus, it is very important to have healthy eating habits because your blood sugar (glucose) levels are greatly affected by what you eat and drink. Eating healthy foods in the right amounts, at about the same times every day, can help you:  Control your blood glucose.  Lower your risk of heart disease.  Improve your blood pressure.  Reach or maintain a healthy weight. What can affect my meal plan? Every person with diabetes is different, and each person has different needs for a meal plan. Your health care provider may recommend that you work with a dietitian to make a meal plan that is best for you. Your meal plan may vary depending on factors such as:  The calories you need.  The medicines you take.  Your weight.  Your blood glucose, blood pressure, and cholesterol levels.  Your activity level.  Other health conditions you have, such as heart or kidney disease. How do carbohydrates affect me? Carbohydrates, also called carbs, affect your blood glucose level more than any other type of food. Eating carbs naturally raises the amount of glucose in your blood. Carb counting is a method for keeping track of how many carbs you eat. Counting carbs is important to keep your blood glucose at a healthy level, especially if you use insulin or take certain oral diabetes medicines. It is important to know how many carbs you can safely have in each meal. This is different for every person. Your dietitian can help you calculate how many carbs you should have at each meal and for each snack. How does alcohol affect me? Alcohol can cause a sudden decrease in blood glucose (hypoglycemia), especially if you use insulin or take certain oral diabetes medicines. Hypoglycemia can be a life-threatening condition. Symptoms of hypoglycemia, such as sleepiness, dizziness, and confusion, are similar to symptoms of having too much  alcohol.  Do not drink alcohol if: ? Your health care provider tells you not to drink. ? You are pregnant, may be pregnant, or are planning to become pregnant.  If you drink alcohol: ? Do not drink on an empty stomach. ? Limit how much you use to:  0-1 drink a day for women.  0-2 drinks a day for men. ? Be aware of how much alcohol is in your drink. In the [place] one drink equals one 12 oz bottle of beer (355 mL), one 5 oz glass of wine (148 mL), or one 1 oz glass of hard liquor (44 mL). ? Keep yourself hydrated with water, diet soda, or unsweetened iced tea.  Keep in mind that regular soda, juice, and other mixers may contain a lot of sugar and must be counted as carbs. What are tips for following this plan? Reading food labels  Start by checking the serving size on the "Nutrition Facts" label of packaged foods and drinks. The amount of calories, carbs, fats, and other nutrients listed on the label is based on one serving of the item. Many items contain more than one serving per package.  Check the total grams (g) of carbs in one serving. You can calculate the number of servings of carbs in one serving by dividing the total carbs by 15. For example, if a food has 30 g of total carbs per serving, it would be equal to 2 servings of carbs.  Check the number of grams (g) of saturated fats and trans fats in one serving. Choose foods that have   a low amount or none of these fats.  Check the number of milligrams (mg) of salt (sodium) in one serving. Most people should limit total sodium intake to less than 2,300 mg per day.  Always check the nutrition information of foods labeled as "low-fat" or "nonfat." These foods may be higher in added sugar or refined carbs and should be avoided.  Talk to your dietitian to identify your daily goals for nutrients listed on the label. Shopping  Avoid buying canned, pre-made, or processed foods. These foods tend to be high in fat, sodium, and added  sugar.  Shop around the outside edge of the grocery store. This is where you will most often find fresh fruits and vegetables, bulk grains, fresh meats, and fresh dairy. Cooking  Use low-heat cooking methods, such as baking, instead of high-heat cooking methods like deep frying.  Cook using healthy oils, such as olive, canola, or sunflower oil.  Avoid cooking with butter, cream, or high-fat meats. Meal planning  Eat meals and snacks regularly, preferably at the same times every day. Avoid going long periods of time without eating.  Eat foods that are high in fiber, such as fresh fruits, vegetables, beans, and whole grains. Talk with your dietitian about how many servings of carbs you can eat at each meal.  Eat 4-6 oz (112-168 g) of lean protein each day, such as lean meat, chicken, fish, eggs, or tofu. One ounce (oz) of lean protein is equal to: ? 1 oz (28 g) of meat, chicken, or fish. ? 1 egg. ?  cup (62 g) of tofu.  Eat some foods each day that contain healthy fats, such as avocado, nuts, seeds, and fish.   What foods should I eat? Fruits Berries. Apples. Oranges. Peaches. Apricots. Plums. Grapes. Mango. Papaya. Pomegranate. Kiwi. Cherries. Vegetables Lettuce. Spinach. Leafy greens, including kale, chard, collard greens, and mustard greens. Beets. Cauliflower. Cabbage. Broccoli. Carrots. Green beans. Tomatoes. Peppers. Onions. Cucumbers. Brussels sprouts. Grains Whole grains, such as whole-wheat or whole-grain bread, crackers, tortillas, cereal, and pasta. Unsweetened oatmeal. Quinoa. Brown or wild rice. Meats and other proteins Seafood. Poultry without skin. Lean cuts of poultry and beef. Tofu. Nuts. Seeds. Dairy Low-fat or fat-free dairy products such as milk, yogurt, and cheese. The items listed above may not be a complete list of foods and beverages you can eat. Contact a dietitian for more information. What foods should I avoid? Fruits Fruits canned with  syrup. Vegetables Canned vegetables. Frozen vegetables with butter or cream sauce. Grains Refined white flour and flour products such as bread, pasta, snack foods, and cereals. Avoid all processed foods. Meats and other proteins Fatty cuts of meat. Poultry with skin. Breaded or fried meats. Processed meat. Avoid saturated fats. Dairy Full-fat yogurt, cheese, or milk. Beverages Sweetened drinks, such as soda or iced tea. The items listed above may not be a complete list of foods and beverages you should avoid. Contact a dietitian for more information. Questions to ask a health care provider  Do I need to meet with a diabetes educator?  Do I need to meet with a dietitian?  What number can I call if I have questions?  When are the best times to check my blood glucose? Where to find more information:  American Diabetes Association: diabetes.org  Academy of Nutrition and Dietetics: www.eatright.org  National Institute of Diabetes and Digestive and Kidney Diseases: www.niddk.nih.gov  Association of Diabetes Care and Education Specialists: www.diabeteseducator.org Summary  It is important to have healthy eating   habits because your blood sugar (glucose) levels are greatly affected by what you eat and drink.  A healthy meal plan will help you control your blood glucose and maintain a healthy lifestyle.  Your health care provider may recommend that you work with a dietitian to make a meal plan that is best for you.  Keep in mind that carbohydrates (carbs) and alcohol have immediate effects on your blood glucose levels. It is important to count carbs and to use alcohol carefully. This information is not intended to replace advice given to you by your health care provider. Make sure you discuss any questions you have with your health care provider. Document Revised: 10/29/2019 Document Reviewed: 10/29/2019 Elsevier Patient Education  2021 Elsevier Inc.  

## 2021-08-24 NOTE — Telephone Encounter (Signed)
When she and her family feel she is safe

## 2021-08-24 NOTE — Telephone Encounter (Signed)
Staff spoke with patient and informed/read her what Dr. Harrington Challenger stated. Patient verbalized understand and responded to staff "ok, thank you".

## 2021-08-24 NOTE — Telephone Encounter (Signed)
Patient do not want provider to call her. Per pt she just want the answer

## 2021-08-25 ENCOUNTER — Encounter: Payer: Self-pay | Admitting: Nurse Practitioner

## 2021-08-25 ENCOUNTER — Ambulatory Visit (INDEPENDENT_AMBULATORY_CARE_PROVIDER_SITE_OTHER): Payer: HMO | Admitting: Nurse Practitioner

## 2021-08-25 ENCOUNTER — Other Ambulatory Visit: Payer: Self-pay

## 2021-08-25 ENCOUNTER — Ambulatory Visit: Payer: HMO | Admitting: Nutrition

## 2021-08-25 VITALS — BP 94/62 | HR 72 | Ht 71.0 in | Wt 222.2 lb

## 2021-08-25 DIAGNOSIS — E1165 Type 2 diabetes mellitus with hyperglycemia: Secondary | ICD-10-CM

## 2021-08-25 DIAGNOSIS — Z794 Long term (current) use of insulin: Secondary | ICD-10-CM | POA: Diagnosis not present

## 2021-08-25 LAB — POCT GLYCOSYLATED HEMOGLOBIN (HGB A1C): HbA1c, POC (controlled diabetic range): 5.7 % (ref 0.0–7.0)

## 2021-08-25 NOTE — Progress Notes (Signed)
Endocrinology Follow Up Visit       08/25/2021, 11:42 AM   Subjective:    Patient ID: Michelle Clements, female    DOB: Nov 27, 1966.  Michelle Clements is being seen in follow up after being seen in consultation for management of currently uncontrolled symptomatic diabetes requested by  [name], MD.   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[place])    Depression    Essential hypertension, benign    Fibromyalgia    GERD (gastroesophageal reflux disease)    H/O sleep apnea    Hip pain, left    History of palpitations    Negative Holter monitor   History of pneumonia 1988   Hyperlipidemia    IBS (irritable bowel syndrome)    Neck pain    Poor short term memory    PTSD (post-traumatic stress disorder)    S/P Botox injection 11/2014    For migraine headaches   Type 2 diabetes mellitus ([place])    Urgency of urination     Past Surgical History:  Procedure Laterality Date   CARPAL TUNNEL RELEASE Right 06/10/2013   Procedure: CARPAL TUNNEL RELEASE;  Surgeon: Faythe Ghee, MD;  Location: [facility];  Service: Neurosurgery;  Laterality: Right;  Right Carpal Tunnel Release    CHOLECYSTECTOMY     DENTAL SURGERY  12/2014   mole exc     x 3   MUSCLE BIOPSY  2008   Right leg   TUBAL LIGATION      Social History   Socioeconomic History   Marital status: Married    Spouse name: Not on file   Number of children: 2   Years of education: College   Highest education level: Not on file  Occupational History   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   Caffeine use: 2 cups coffee in morning and 2 cups tea during the day    Social Determinants of Health   Financial Resource Strain: Low Risk    Difficulty of Paying Living Expenses: Not very hard  Food Insecurity: No Food Insecurity   Worried About Charity fundraiser in the Last Year: Never true   Ran Out of Food in the Last Year: Never true  Transportation Needs: No Data processing manager (Medical): No   Lack of Transportation (Non-Medical): No  Physical Activity: Inactive   Days of Exercise per Week: 0 days   Minutes of Exercise per Session: 0 min  Stress: No Stress Concern Present   Feeling of Stress : Not at all  Social Connections: Moderately Integrated   Frequency of Communication with Friends and Family: More than three times a week   Frequency of Social Gatherings with Friends and Family: More than three times a week   Attends Religious Services: More than 4 times per year   Active Member of [employer] or Organizations: No   Attends Music therapist: Never   Marital Status: Married    Family History  Problem Relation Age of Onset   Fibromyalgia Mother    Diabetes type II Mother    Depression Mother    Alzheimer's disease Mother    GER disease Mother    Heart disease Mother    Paranoid behavior Mother    Dementia Mother    Heart attack Father 15       MI x5   Stroke Father        CVA x7   Asthma Father    Brain cancer Father    Seizures Father    Anxiety disorder Sister    OCD Sister    Sexual abuse Sister    Physical abuse Sister    Anxiety disorder Sister     ADD / ADHD Daughter    ADD / ADHD Daughter    Alcohol abuse Neg Hx    Drug abuse Neg Hx    Schizophrenia Neg Hx     Outpatient Encounter Medications as of 08/25/2021  Medication Sig   albuterol (PROVENTIL HFA;VENTOLIN HFA) 108 (90 BASE) MCG/ACT inhaler Inhale 2 puffs into the lungs every 6 (six) hours as needed for wheezing or shortness of breath.   Ascorbic Acid (VITAMIN C) 1000 MG tablet Take 1,000 mg by mouth daily.   Blood Glucose Monitoring Suppl ([place]) w/Device KIT See admin instructions.   buPROPion (WELLBUTRIN XL) 150 MG 24 hr tablet Take 1 tablet (150 mg total) by mouth every morning.   cetirizine (ZYRTEC) 10 MG tablet Take 10 mg by mouth at bedtime.   Cholecalciferol (VITAMIN D) 50 MCG (2000 UT) tablet Take 2,000 Units by mouth daily.   clonazePAM (KLONOPIN) 0.5 MG tablet Take 1 tablet (0.5 mg total) by mouth 2 (two) times daily as needed for anxiety.   clonazePAM (KLONOPIN) 1 MG tablet Take 1 tablet (1 mg total) by mouth 2 (two) times daily as needed for anxiety.   estradiol (ESTRACE) 0.1 MG/GM vaginal cream Place vaginally.   FLUoxetine (PROZAC) 40 MG capsule Take 1 capsule (40 mg total) by mouth daily.   fluticasone (FLONASE) 50 MCG/ACT nasal spray Place 1 spray into both nostrils daily.   Glucos-Chond-Hyal Ac-Ca Fructo (MOVE FREE JOINT HEALTH ADVANCE PO) Take by mouth.   ibuprofen (ADVIL) 800 MG tablet Take 800 mg by mouth 2 (two) times daily.   insulin glargine (LANTUS SOLOSTAR) 100 UNIT/ML Solostar Pen Inject 50 Units into the skin daily.   Lancets (ONETOUCH ULTRASOFT) lancets 1 each by Other route as needed.    lubiprostone (AMITIZA) 24 MCG capsule TAKE ONE CAPSULE BY MOUTH EVERY MORNING and TAKE ONE CAPSULE BY MOUTH EVERY EVENING   Melatonin 5 MG TABS Take 5 mg by mouth at bedtime as needed.    naproxen sodium (  ALEVE) 220 MG tablet Take 220 mg by mouth 2 (two) times daily as needed.   omeprazole (PRILOSEC) 20 MG capsule Take 20 mg by mouth daily.    ONETOUCH VERIO test strip 4 (four) times daily.   pregabalin (LYRICA) 200 MG capsule Take 200 mg by mouth 3 (three) times daily.   rosuvastatin (CRESTOR) 20 MG tablet Take 1 tablet (20 mg total) by mouth at bedtime.   sennosides-docusate sodium (SENOKOT-S) 8.6-50 MG tablet Take 1 tablet by mouth daily.   tiZANidine (ZANAFLEX) 4 MG tablet Take 4 mg by mouth daily. 52m at bedtime and 2 mg during the day   vitamin B-12 (CYANOCOBALAMIN) 100 MCG tablet Take 100 mcg by mouth daily.   zinc gluconate 50 MG tablet Take 50 mg by mouth daily.   [DISCONTINUED] sitaGLIPtin (JANUVIA) 100 MG tablet Take 1 tablet (100 mg total) by mouth daily.   [DISCONTINUED] ascorbic acid (VITAMIN C) 500 MG tablet Take by mouth. (Patient not taking: Reported on 08/25/2021)   [DISCONTINUED] buPROPion (WELLBUTRIN XL) 150 MG 24 hr tablet Take by mouth. (Patient not taking: Reported on 08/25/2021)   [DISCONTINUED] cetirizine (ZYRTEC) 10 MG tablet Take by mouth. (Patient not taking: Reported on 08/25/2021)   [DISCONTINUED] Cholecalciferol 25 MCG (1000 UT) tablet Take by mouth. (Patient not taking: Reported on 08/25/2021)   [DISCONTINUED] FLUoxetine (PROZAC) 40 MG capsule Take by mouth. (Patient not taking: Reported on 08/25/2021)   [DISCONTINUED] HYDROcodone-acetaminophen (NORCO) 10-325 MG tablet Take 1 tablet by mouth 4 (four) times daily as needed. (Patient not taking: No sig reported)   [DISCONTINUED] ipratropium-albuterol (DUONEB) 0.5-2.5 (3) MG/3ML SOLN Take by nebulization. (Patient not taking: Reported on 08/25/2021)   [DISCONTINUED] zinc sulfate 220 (50 Zn) MG capsule Take by mouth. (Patient not taking: Reported on 08/25/2021)   No facility-administered encounter medications on file as of 08/25/2021.    ALLERGIES: Allergies  Allergen Reactions   Molds & Smuts Shortness Of Breath    wheezing   Other Anaphylaxis, Shortness Of Breath and Other (See Comments)     : Angioedema wheezing Wheezing wheezing Other reaction(s):  Cough Wheezing Itching and rash Wheezing wheezing Wheezing wheezing Other reaction(s): Other (See Comments)  : Angioedema wheezing Wheezing wheezing Other reaction(s): Cough Wheezing Itching and rash Wheezing wheezing Other reaction(s): Other (See Comments) Wheezing wheezing  : Angioedema wheezing Wheezing wheezing Other reaction(s): Cough Wheezing Itching and rash Wheezing wheezing Wheezing wheezing   Statins Anaphylaxis   Sulfa Antibiotics Anaphylaxis     : Angioedema    Sulfonamide Derivatives Anaphylaxis     : Angioedema   Trichophyton Shortness Of Breath    wheezing wheezing wheezing wheezing wheezing wheezing wheezing wheezing wheezing   Carbamazepine Rash   Dust Mite Extract Cough and Other (See Comments)    Wheezing Other reaction(s): Wheezing Wheezing Wheezing Other reaction(s): Wheezing Wheezing Wheezing Other reaction(s): Wheezing Wheezing   Gluten Meal Itching   Clonazepam Other (See Comments)    Confusion and memory loss Caused nconfusion    VACCINATION STATUS: Immunization History  Administered Date(s) Administered   Influenza Whole 10/03/2007   Influenza-Unspecified 09/18/2014, 09/23/2015   Pneumococcal Polysaccharide-23 10/14/2012    Diabetes She presents for her follow-up diabetic visit. She has type 2 diabetes mellitus. Onset time: She was diagnosed at approximate age of 89 Her disease course has been stable. There are no hypoglycemic associated symptoms. Pertinent negatives for diabetes include no blurred vision, no fatigue, no polydipsia, no polyphagia and no polyuria. There are no hypoglycemic complications. Symptoms are stable. Diabetic  complications include nephropathy and retinopathy. Risk factors for coronary artery disease include diabetes mellitus, dyslipidemia, obesity and sedentary lifestyle. Current diabetic treatment includes insulin injections and oral agent (monotherapy). She is compliant with treatment all of  the time. Her weight is stable. She is following a generally healthy diet. Meal planning includes avoidance of concentrated sweets. She has not had a previous visit with a dietitian. She participates in exercise intermittently. Her home blood glucose trend is fluctuating minimally. Her breakfast blood glucose range is generally 70-90 mg/dl. Her bedtime blood glucose range is generally 130-140 mg/dl. (She presents today with her meter and logs showing at goal fasting and near target postprandial glycemic profile.  Her POCT A1c today is 5.7%, stable from last visit of 5.4%.  She denies any significant hypoglycemia.  She is mourning the loss of her mother recently and is taking it pretty hard.  She was recently hospitalized and was found to have elevated pancreatic enzymes.  She does report she was put on Metformin during her hospital stay but was not reinitiated on it at discharge.) An ACE inhibitor/angiotensin II receptor blocker is not being taken. She does not see a podiatrist.Eye exam is current.  Hyperlipidemia This is a chronic problem. The current episode started more than 1 year ago. The problem is controlled. Recent lipid tests were reviewed and are normal. Exacerbating diseases include chronic renal disease, diabetes and obesity. There are no known factors aggravating her hyperlipidemia. Current antihyperlipidemic treatment includes statins. The current treatment provides moderate improvement of lipids. There are no compliance problems.  Risk factors for coronary artery disease include diabetes mellitus, dyslipidemia, obesity and a sedentary lifestyle.   Review of systems  Constitutional: + steadily decreasing body weight,  current Body mass index is 30.99 kg/m. , no fatigue, no subjective hyperthermia, no subjective hypothermia Eyes: no blurry vision, no xerophthalmia ENT: no sore throat, no nodules palpated in throat, no dysphagia/odynophagia, no hoarseness Cardiovascular: no chest pain, no  shortness of breath, no palpitations, no leg swelling Respiratory: no cough, no shortness of breath Gastrointestinal: no nausea/vomiting/diarrhea Musculoskeletal: chronic generalized pain- taking only Advil for now- sees pain specialist soon Skin: no rashes, no hyperemia Neurological: no tremors, no numbness, no tingling, no dizziness Psychiatric: + depression- on meds, sees psychiatrist , no anxiety   Objective:    BP 94/62   Pulse 72   Ht 5' 11"  (1.803 m)   Wt 222 lb 3.2 oz (100.8 kg)   LMP 01/04/2018   BMI 30.99 kg/m   Wt Readings from Last 3 Encounters:  08/25/21 222 lb 3.2 oz (100.8 kg)  07/27/21 230 lb 12.8 oz (104.7 kg)  04/21/21 216 lb (98 kg)    BP Readings from Last 3 Encounters:  08/25/21 94/62  07/27/21 (!) 144/75  04/21/21 138/82     Physical Exam- Limited  Constitutional:  Body mass index is 30.99 kg/m. , not in acute distress, normal state of mind Eyes:  EOMI, no exophthalmos Neck: Supple Cardiovascular: RRR, no murmurs, rubs, or gallops, no edema Respiratory: Adequate breathing efforts, no crackles, rales, rhonchi, or wheezing Musculoskeletal: no gross deformities, strength intact in all four extremities, no gross restriction of joint movements Skin:  no rashes, no hyperemia Neurological: no tremor with outstretched hands   CMP ( most recent) CMP     Component Value Date/Time   NA 142 04/14/2021 0000   K 4.0 04/14/2021 0000   CL 100 04/14/2021 0000   CO2 27 (A) 04/14/2021 0000   GLUCOSE 119 (  H) 07/03/2019 1230   BUN 27 (A) 04/14/2021 0000   CREATININE 1.4 (A) 04/14/2021 0000   CREATININE 1.00 07/03/2019 1230   CREATININE 0.88 02/21/2012 1153   CALCIUM 10.0 04/14/2021 0000   PROT 7.0 07/03/2019 1230   ALBUMIN 4.7 04/14/2021 0000   AST 56 (A) 04/14/2021 0000   ALT 108 (A) 04/14/2021 0000   ALKPHOS 66 04/14/2021 0000   BILITOT 0.9 07/03/2019 1230   GFRNONAA 44 12/29/2020 0000   GFRAA 50 12/29/2020 0000     Diabetic Labs (most  recent): Lab Results  Component Value Date   HGBA1C 5.7 08/25/2021   HGBA1C 5.4 04/21/2021   HGBA1C 5.4 12/29/2020     Lipid Panel ( most recent) Lipid Panel     Component Value Date/Time   CHOL 134 12/29/2020 0000   TRIG 185 (A) 12/29/2020 0000   HDL 39 12/29/2020 0000   CHOLHDL 4.2 03/18/2015 0915   VLDL 44 (H) 03/18/2015 0915   LDLCALC 64 12/29/2020 0000      Lab Results  Component Value Date   TSH 2.69 12/29/2020   TSH 1.66 09/14/2020   TSH 2.770 08/29/2019   TSH 3.261 10/14/2012   TSH 2.651 02/21/2012   TSH 1.981 03/06/2009   TSH 3.250 01/23/2008           Assessment & Plan:   1) Type 2 diabetes mellitus with hyperglycemia, with long-term current use of insulin ([place])  - Michelle Clements has currently uncontrolled symptomatic type 2 DM since 55 years of age.  She presents today with her meter and logs showing at goal fasting and near target postprandial glycemic profile.  Her POCT A1c today is 5.7%, stable from last visit of 5.4%.  She denies any significant hypoglycemia.  She is mourning the loss of her mother recently and is taking it pretty hard.  She was recently hospitalized and was found to have elevated pancreatic enzymes.  She does report she was put on Metformin during her hospital stay but was not reinitiated on it at discharge.   Recent labs reviewed, showing stable CKD stage 3.  - I had a long discussion with her about the progressive nature of diabetes and the pathology behind its complications. -her diabetes is complicated by sleep apnea, HLD, retinopathy and she remains at a high risk for more acute and chronic complications which include CAD, CVA, CKD, retinopathy, and neuropathy. These are all discussed in detail with her.  - Nutritional counseling repeated at each appointment due to patients tendency to fall back in to old habits.  - The patient admits there is a room for improvement in their diet and drink choices. -  Suggestion is made for the  patient to avoid simple carbohydrates from their diet including Cakes, Sweet Desserts / Pastries, Ice Cream, Soda (diet and regular), Sweet Tea, Candies, Chips, Cookies, Sweet Pastries, Store Bought Juices, Alcohol in Excess of 1-2 drinks a day, Artificial Sweeteners, Coffee Creamer, and "Sugar-free" Products. This will help patient to have stable blood glucose profile and potentially avoid unintended weight gain.   - I encouraged the patient to switch to unprocessed or minimally processed complex starch and increased protein intake (animal or plant source), fruits, and vegetables.   - Patient is advised to stick to a routine mealtimes to eat 3 meals a day and avoid unnecessary snacks (to snack only to correct hypoglycemia).  - she is following with Jearld Fenton, RDE for diabetes education.  - I have approached her with the  following individualized plan to manage  her diabetes and patient agrees:   -Based on her recently elevated lipase levels, will stop Januvia altogether for now, this should also help her kidneys as well.  She is advised to continue her Lantus 50 units SQ nightly.    -She is encouraged to continue monitoring blood glucose twice daily, before breakfast and before bed, and to call the clinic if she has readings less than 70 or greater than 200 for 3 tests in a row.  - Specific targets for  A1c;  LDL, HDL,  and Triglycerides were discussed with the patient.  2) Blood Pressure /Hypertension:   her blood pressure is controlled to target without the use of antihypertensive medications (borderline low today).  She will be considered for low dose ACE/ARB if BP is above 140/90 for 3 separate visits.  She is on several medications which will drop her blood pressure.  Reinforced the need to stay adequately hydrated.  3) Lipids/Hyperlipidemia:    Her most recent lipid panel from 05/04/21 shows LDL of 117 and improved triglycerides of 144.Marland Kitchen  She is advised to continue her Crestor 20 mg po  daily at bedtime.  Side effects and precautions discussed with her.  4) Weight/Diet:  Her Body mass index is 30.99 kg/m.  -  clearly complicating her diabetes care.  She has lost approx 10 lbs and is advised to keep it up!  she is a candidate for weight loss. I discussed with her the fact that loss of 5 - 10% of her  current body weight will have the most impact on her diabetes management.  Exercise, and detailed carbohydrates information provided  -  detailed on discharge instructions.  5) Chronic Care/Health Maintenance: -she is on Statin medications and is encouraged to initiate and continue to follow up with Ophthalmology, Dentist,  Podiatrist at least yearly or according to recommendations, and advised to stay away from smoking. I have recommended yearly flu vaccine and pneumonia vaccine at least every 5 years; moderate intensity exercise for up to 150 minutes weekly; and  sleep for at least 7 hours a day.  - she is advised to maintain close follow up with [name], MD for primary care needs, as well as her other providers for optimal and coordinated care.       I spent 46 minutes in the care of the patient today including review of labs from [facility], Lipids, Thyroid Function, Hematology (current and previous including abstractions from other facilities); face-to-face time discussing  her blood glucose readings/logs, discussing hypoglycemia and hyperglycemia episodes and symptoms, medications doses, her options of short and long term treatment based on the latest standards of care / guidelines;  discussion about incorporating lifestyle medicine;  and documenting the encounter.    Please refer to Patient Instructions for Blood Glucose Monitoring and Insulin/Medications Dosing Guide"  in media tab for additional information. Please  also refer to " Patient Self Inventory" in the Media  tab for reviewed elements of pertinent patient history.  Michelle Clements participated in the discussions,  expressed understanding, and voiced agreement with the above plans.  All questions were answered to her satisfaction. she is encouraged to contact clinic should she have any questions or concerns prior to her return visit.   Follow up plan: - Return in about 4 months (around 12/25/2021) for Diabetes F/U with A1c in office, Previsit labs, Bring meter and logs.  Rayetta Pigg, FNP-BC [facility] [address] [place],  [place] [address] Phone: [phone] Fax: [phone]  08/25/2021, 11:42 AM

## 2021-08-26 ENCOUNTER — Other Ambulatory Visit (HOSPITAL_COMMUNITY): Payer: HMO | Attending: Psychiatry | Admitting: Psychiatry

## 2021-08-26 ENCOUNTER — Other Ambulatory Visit (HOSPITAL_COMMUNITY): Payer: Self-pay | Admitting: *Deleted

## 2021-08-26 DIAGNOSIS — F332 Major depressive disorder, recurrent severe without psychotic features: Secondary | ICD-10-CM

## 2021-08-26 DIAGNOSIS — R4589 Other symptoms and signs involving emotional state: Secondary | ICD-10-CM | POA: Insufficient documentation

## 2021-08-26 DIAGNOSIS — R41844 Frontal lobe and executive function deficit: Secondary | ICD-10-CM | POA: Insufficient documentation

## 2021-08-26 DIAGNOSIS — Z560 Unemployment, unspecified: Secondary | ICD-10-CM | POA: Insufficient documentation

## 2021-08-26 DIAGNOSIS — Z79899 Other long term (current) drug therapy: Secondary | ICD-10-CM | POA: Insufficient documentation

## 2021-08-26 DIAGNOSIS — Z87891 Personal history of nicotine dependence: Secondary | ICD-10-CM | POA: Insufficient documentation

## 2021-08-26 DIAGNOSIS — Z818 Family history of other mental and behavioral disorders: Secondary | ICD-10-CM | POA: Insufficient documentation

## 2021-08-26 DIAGNOSIS — F319 Bipolar disorder, unspecified: Secondary | ICD-10-CM | POA: Insufficient documentation

## 2021-08-26 DIAGNOSIS — Z7289 Other problems related to lifestyle: Secondary | ICD-10-CM | POA: Insufficient documentation

## 2021-08-26 NOTE — Progress Notes (Signed)
Virtual Visit via Video Note  I connected with Michelle Clements on 08/26/21 at  9:00 AM EDT by a video enabled telemedicine application and verified that I am speaking with the correct person using two identifiers.  Location: Patient: Home Provider: Office   I discussed the limitations of evaluation and management by telemedicine and the availability of in person appointments. The patient expressed understanding and agreed to proceed.    I discussed the assessment and treatment plan with the patient. The patient was provided an opportunity to ask questions and all were answered. The patient agreed with the plan and demonstrated an understanding of the instructions.   The patient was advised to call back or seek an in-person evaluation if the symptoms worsen or if the condition fails to improve as anticipated.  I provided 15  minutes of non-face-to-face time during this encounter.   Derrill Center, NP    Psychiatric Initial Adult Assessment   Patient Identification: Michelle Clements MRN:  [id number] Date of Evaluation:  08/29/2021 Referral Source:  Inpatient admission Chief Complaint:   Depression and suicidal ideations Visit Diagnosis:    ICD-10-CM   1. Bipolar 1 disorder (HCC)  F31.9     2. Major depressive disorder, recurrent, severe without psychotic features ([place])  F33.2       History of Present Illness:  Michelle Clements 55 year old, marriedCaucasian female presents after referral from inpatient admission.  She reported "Intentional overdose".  she reports multiple stressors related to her depression.  States the recent passing of her mother 9/4.  States she became overwhelmed with family members coming into town and making demands about "how things should go ."  Stated she impulsively took a multiple handful of prescription medications.  She reports a history of bipolar disorder, major depressive disorder and  generalized anxiety disorder.  Patient reports she is currently followed by  psychiatrist Harrington Challenger and therapist Maurice Small. Reported a recently medication adjustment prior to her inpatient admission.   Michelle Clements denied substance abuse history, denied history of physical or sexual abuse.  Denied previous inpatient admissions.  Reports she was prescribed Wellbutrin 150, Prozac 40 mg and Klonopin 1 mg twice daily for anxiety.  She reports she has been taking and tolerating medications well.  Currently she is denying suicidal or homicidal ideations.  Denies auditory or visual hallucinations.  Patient to be admitted into intensive outpatient programming (IOP) 08/26/2021   Associated Signs/Symptoms: Depression Symptoms:  depressed mood, difficulty concentrating, anxiety, (Hypo) Manic Symptoms:  Distractibility, Impulsivity, Irritable Mood, Labiality of Mood, Anxiety Symptoms:  Excessive Worry, Social Anxiety, Psychotic Symptoms:  Hallucinations: None PTSD Symptoms: NA  Past Psychiatric History:   Previous Psychotropic Medications: No   Substance Abuse History in the last 12 months:  No.  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[place])    Depression    Essential hypertension, benign    Fibromyalgia    GERD (gastroesophageal reflux disease)    H/O sleep apnea    Hip pain, left    History of palpitations    Negative Holter monitor   History of pneumonia 1988   Hyperlipidemia    IBS (irritable bowel syndrome)  Neck pain    Poor short term memory    PTSD (post-traumatic stress disorder)    S/P Botox injection 11/2014    For migraine headaches   Type 2 diabetes mellitus ([place])    Urgency of urination     Past Surgical History:  Procedure Laterality Date   CARPAL TUNNEL RELEASE Right 06/10/2013    Procedure: CARPAL TUNNEL RELEASE;  Surgeon: Faythe Ghee, MD;  Location: [facility];  Service: Neurosurgery;  Laterality: Right;  Right Carpal Tunnel Release    CHOLECYSTECTOMY     DENTAL SURGERY  12/2014   mole exc     x 3   MUSCLE BIOPSY  2008   Right leg   TUBAL LIGATION      Family Psychiatric History:   Family History:  Family History  Problem Relation Age of Onset   Fibromyalgia Mother    Diabetes type II Mother    Depression Mother    Alzheimer's disease Mother    GER disease Mother    Heart disease Mother    Paranoid behavior Mother    Dementia Mother    Heart attack Father 71       MI x5   Stroke Father        CVA x7   Asthma Father    Brain cancer Father    Seizures Father    Anxiety disorder Sister    OCD Sister    Sexual abuse Sister    Physical abuse Sister    Anxiety disorder Sister    ADD / ADHD Daughter    ADD / ADHD Daughter    Alcohol abuse Neg Hx    Drug abuse Neg Hx    Schizophrenia Neg Hx     Social History:   Social History   Socioeconomic History   Marital status: Married    Spouse name: Not on file   Number of children: 2   Years of education: College   Highest education level: Not on file  Occupational History   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   Caffeine use: 2 cups coffee in morning and 2 cups tea during the day    Social Determinants of Health   Financial Resource Strain: Low Risk     Difficulty of Paying Living Expenses: Not very hard  Food Insecurity: No Food Insecurity   Worried About Charity fundraiser in the Last Year: Never true   Ran Out of Food in the Last Year: Never true  Transportation Needs: No Transportation Needs   Lack of Transportation (Medical): No   Lack of Transportation (Non-Medical): No  Physical Activity: Inactive   Days of Exercise per Week: 0 days   Minutes of Exercise per Session: 0 min  Stress: No Stress Concern Present   Feeling of Stress : Not at all  Social Connections: Moderately Integrated   Frequency of Communication with Friends and Family: More than three times a week   Frequency of Social Gatherings with Friends and Family: More than three times a week   Attends Religious Services: More than 4 times per year   Active Member of [employer] or Organizations: No   Attends Archivist Meetings: Never   Marital Status: Married    Additional Social History:   Allergies:   Allergies  Allergen Reactions   Molds & Smuts Shortness Of Breath    wheezing   Other Anaphylaxis, Shortness Of Breath and Other (See Comments)     : Angioedema wheezing Wheezing wheezing Other reaction(s): Cough Wheezing Itching and rash Wheezing wheezing Wheezing wheezing Other reaction(s): Other (See Comments)  : Angioedema wheezing Wheezing wheezing Other reaction(s): Cough Wheezing Itching and rash Wheezing wheezing Other reaction(s): Other (See Comments) Wheezing wheezing  : Angioedema wheezing Wheezing wheezing Other reaction(s): Cough Wheezing Itching and rash Wheezing wheezing Wheezing wheezing   Statins Anaphylaxis   Sulfa Antibiotics Anaphylaxis     : Angioedema    Sulfonamide Derivatives Anaphylaxis     : Angioedema   Trichophyton Shortness Of Breath    wheezing wheezing wheezing wheezing wheezing wheezing wheezing wheezing wheezing   Carbamazepine Rash   Dust Mite Extract Cough and Other (See  Comments)    Wheezing Other reaction(s): Wheezing Wheezing Wheezing Other reaction(s): Wheezing Wheezing Wheezing Other reaction(s): Wheezing Wheezing   Gluten Meal Itching   Clonazepam Other (See Comments)    Confusion and memory loss Caused nconfusion    Metabolic Disorder Labs: Lab Results  Component Value Date   HGBA1C 5.7 08/25/2021   MPG 131 03/18/2015   No results found for: PROLACTIN Lab Results  Component Value Date   CHOL 134 12/29/2020   TRIG 185 (A) 12/29/2020   HDL 39 12/29/2020   CHOLHDL 4.2 03/18/2015   VLDL 44 (H) 03/18/2015   LDLCALC 64 12/29/2020   LDLCALC 89 08/26/2020   Lab Results  Component Value Date   TSH 2.69 12/29/2020    Therapeutic Level Labs: Lab Results  Component Value Date   LITHIUM 0.20 (L) 02/21/2012   No results found for: CBMZ No results found for: VALPROATE  Current Medications: Current Outpatient Medications  Medication Sig Dispense Refill   albuterol (PROVENTIL HFA;VENTOLIN HFA) 108 (90 BASE) MCG/ACT inhaler Inhale 2 puffs into the lungs every 6 (six) hours as needed for wheezing or shortness of breath.     Ascorbic Acid (VITAMIN C) 1000 MG tablet Take 1,000 mg by mouth daily.     Blood Glucose Monitoring Suppl ([place]) w/Device KIT See admin instructions.     buPROPion (WELLBUTRIN XL) 150 MG 24 hr tablet Take 1 tablet (150 mg total) by mouth every morning. 30 tablet 2   cetirizine (ZYRTEC) 10 MG tablet Take 10 mg by mouth at bedtime.     Cholecalciferol (VITAMIN D) 50 MCG (2000 UT) tablet Take 2,000 Units by mouth daily.     clonazePAM (KLONOPIN) 0.5 MG tablet Take 1 tablet (0.5 mg total) by mouth 2 (two) times daily as needed for anxiety. 60 tablet 2   clonazePAM (KLONOPIN) 1 MG tablet Take 1 tablet (1 mg total) by mouth 2 (two) times daily as needed for anxiety. 60 tablet 2   estradiol (ESTRACE) 0.1 MG/GM vaginal cream Place vaginally.     FLUoxetine (PROZAC) 40 MG capsule Take 1 capsule (40 mg  total) by mouth daily. 30 capsule 2  fluticasone (FLONASE) 50 MCG/ACT nasal spray Place 1 spray into both nostrils daily.     Glucos-Chond-Hyal Ac-Ca Fructo (MOVE FREE JOINT HEALTH ADVANCE PO) Take by mouth.     ibuprofen (ADVIL) 800 MG tablet Take 800 mg by mouth 2 (two) times daily.     insulin glargine (LANTUS SOLOSTAR) 100 UNIT/ML Solostar Pen Inject 50 Units into the skin daily.     Lancets (ONETOUCH ULTRASOFT) lancets 1 each by Other route as needed.      lubiprostone (AMITIZA) 24 MCG capsule TAKE ONE CAPSULE BY MOUTH EVERY MORNING and TAKE ONE CAPSULE BY MOUTH EVERY EVENING     Melatonin 5 MG TABS Take 5 mg by mouth at bedtime as needed.      naproxen sodium (ALEVE) 220 MG tablet Take 220 mg by mouth 2 (two) times daily as needed.     omeprazole (PRILOSEC) 20 MG capsule Take 20 mg by mouth daily.     ONETOUCH VERIO test strip 4 (four) times daily.     pregabalin (LYRICA) 200 MG capsule Take 200 mg by mouth 3 (three) times daily.     rosuvastatin (CRESTOR) 20 MG tablet Take 1 tablet (20 mg total) by mouth at bedtime. 90 tablet 3   sennosides-docusate sodium (SENOKOT-S) 8.6-50 MG tablet Take 1 tablet by mouth daily.     tiZANidine (ZANAFLEX) 4 MG tablet Take 4 mg by mouth daily. 1m at bedtime and 2 mg during the day     vitamin B-12 (CYANOCOBALAMIN) 100 MCG tablet Take 100 mcg by mouth daily.     zinc gluconate 50 MG tablet Take 50 mg by mouth daily.     No current facility-administered medications for this visit.    Musculoskeletal: Strength & Muscle Tone: within normal limits Gait & Station: normal Patient leans: N/A  Psychiatric Specialty Exam: Review of Systems  Last menstrual period 01/04/2018.There is no height or weight on file to calculate BMI.  General Appearance: Casual  Eye Contact:  Good  Speech:  Clear and Coherent  Volume:  Normal  Mood:  Anxious and Depressed  Affect:  Congruent  Thought Process:  Coherent  Orientation:  Full (Time, Place, and Person)   Thought Content:  Logical  Suicidal Thoughts:  No  Homicidal Thoughts:  No  Memory:  Immediate;   Good Recent;   Good  Judgement:  Fair  Insight:  Fair  Psychomotor Activity:  Normal  Concentration:  Concentration: Good  Recall:  Good  Fund of Knowledge:Good  Language: Good  Akathisia:  No  Handed:  Right  AIMS (if indicated):  done  Assets:  Communication Skills Desire for Improvement Resilience Social Support  ADL's:  Intact  Cognition: WNL  Sleep:  Negative   Screenings: Mini-Mental    Flowsheet Row [facility]  Total Score (max 30 points ) 27      PHQ2-9    Flowsheet Row [facility]  PHQ-2 Total Score 3 6 0 0 3  PHQ-9 Total Score 5 13 -- -- 14      Flowsheet Row [facility]20[facility] Video Visit from [facility]/20[facility] Video Visit from 07/05/2021 in  [facility]  C-SSRS RISK CATEGORY Error: Q3, 4, or 5 should not be populated when Q2 is No Error: Question 6 not populated Error: Question 6 not populated       Assessment and Plan:  Start Intensive Outpatient programing -Continue medications as directed  - patient inquired about [facility] testing   Treatment plan was reviewed and agreed upon by NP T.Paxten Appelt and patient Michelle Clements need for group service  Derrill Center, NP 9/25/20222:42 PM

## 2021-08-27 ENCOUNTER — Other Ambulatory Visit (HOSPITAL_COMMUNITY): Payer: HMO | Admitting: Professional

## 2021-08-27 ENCOUNTER — Ambulatory Visit (HOSPITAL_COMMUNITY): Payer: HMO | Admitting: Psychiatry

## 2021-08-27 ENCOUNTER — Other Ambulatory Visit (HOSPITAL_COMMUNITY): Payer: HMO | Admitting: Occupational Therapy

## 2021-08-27 ENCOUNTER — Other Ambulatory Visit: Payer: Self-pay

## 2021-08-27 ENCOUNTER — Encounter (HOSPITAL_COMMUNITY): Payer: Self-pay

## 2021-08-27 DIAGNOSIS — R4589 Other symptoms and signs involving emotional state: Secondary | ICD-10-CM | POA: Diagnosis not present

## 2021-08-27 DIAGNOSIS — F332 Major depressive disorder, recurrent severe without psychotic features: Secondary | ICD-10-CM

## 2021-08-27 DIAGNOSIS — R41844 Frontal lobe and executive function deficit: Secondary | ICD-10-CM

## 2021-08-27 DIAGNOSIS — F319 Bipolar disorder, unspecified: Secondary | ICD-10-CM

## 2021-08-27 DIAGNOSIS — Z560 Unemployment, unspecified: Secondary | ICD-10-CM | POA: Diagnosis not present

## 2021-08-27 DIAGNOSIS — Z79899 Other long term (current) drug therapy: Secondary | ICD-10-CM | POA: Diagnosis not present

## 2021-08-27 DIAGNOSIS — Z87891 Personal history of nicotine dependence: Secondary | ICD-10-CM | POA: Diagnosis not present

## 2021-08-27 DIAGNOSIS — Z7289 Other problems related to lifestyle: Secondary | ICD-10-CM | POA: Diagnosis not present

## 2021-08-27 DIAGNOSIS — Z818 Family history of other mental and behavioral disorders: Secondary | ICD-10-CM | POA: Diagnosis not present

## 2021-08-27 NOTE — Therapy (Signed)
Michelle Clements, [place], [address] Phone: [phone]   Fax:  [phone] Virtual Visit via Video Note  I connected with Michelle Clements on 08/27/21 at  12:00 PM EDT by a video enabled telemedicine application and verified that I am speaking with the correct person using two identifiers.  Location: Patient: Patient Home Provider: Clinic Office   I discussed the limitations of evaluation and management by telemedicine and the availability of in person appointments. The patient expressed understanding and agreed to proceed.   I discussed the assessment and treatment plan with the patient. The patient was provided an opportunity to ask questions and all were answered. The patient agreed with the plan and demonstrated an understanding of the instructions.   The patient was advised to call back or seek an in-person evaluation if the symptoms worsen or if the condition fails to improve as anticipated.  I provided 78 minutes of non-face-to-face time during this encounter. 50 minutes OT Group Therapy 28 minutes OT Evaluation  [name], OT/L   Occupational Therapy Evaluation  Patient Details  Name: Michelle Clements MRN: [id number] Date of Birth: [birth year] Referring Provider (OT): Ricky Ala   Encounter Date: 08/27/2021   OT End of Session - 08/27/21 1434     Visit Number 1    Number of Visits 20    Date for OT Re-Evaluation 09/24/21    Authorization Type [employer]    OT Start Time 1200   OT Eval [phone]   OT Stop Time 1250    OT Time Calculation (min) 50 min    Activity Tolerance Patient tolerated treatment well    Behavior During Therapy WFL for tasks assessed/performed             Past Medical History:  Diagnosis Date   Allergic rhinitis    Anemia    Arthritis    Lower back and hips   Asthma    Bipolar disorder ([place])    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[place])    Constipation     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[place])    Depression    Essential hypertension, benign    Fibromyalgia    GERD (gastroesophageal reflux disease)    H/O sleep apnea    Hip pain, left    History of palpitations    Negative Holter monitor   History of pneumonia 1988   Hyperlipidemia    IBS (irritable bowel syndrome)    Neck pain    Poor short term memory    PTSD (post-traumatic stress disorder)    S/P Botox injection 11/2014    For migraine headaches   Type 2 diabetes mellitus ([place])    Urgency of urination     Past Surgical History:  Procedure Laterality Date   CARPAL TUNNEL RELEASE Right 06/10/2013   Procedure: CARPAL TUNNEL RELEASE;  Surgeon: Faythe Ghee, MD;  Location: [facility];  Service: Neurosurgery;  Laterality: Right;  Right Carpal Tunnel Release    CHOLECYSTECTOMY     DENTAL SURGERY  12/2014   mole exc     x 3   MUSCLE BIOPSY  2008   Right leg   TUBAL LIGATION      There were no vitals filed for this visit.   Subjective Assessment - 08/27/21 1432     Currently in Pain? No/denies               [facility] OT Assessment - 08/27/21 0001  Assessment   Medical Diagnosis Bipolar I disorder    Referring Provider (OT) Ricky Ala      Precautions   Precautions None      Balance Screen   Has the patient fallen in the past 6 months No    Has the patient had a decrease in activity level because of a fear of falling?  No    Is the patient reluctant to leave their home because of a fear of falling?  No              OT Education - 08/27/21 1432     Education Details Educated on OT role within PHP in addition to identifying worry and utilized circle on control tool to categorize what we do and do not have control over    Person(s) Educated Patient    Methods Explanation;Handout    Comprehension Verbalized understanding              OT Short Term Goals - 08/27/21 1439       OT SHORT TERM GOAL #1   Title Pt will actively engage in  OT group sessions throughout duration of PHP programming, in order to promote daily structure, social engagement, and opportunities to develop and utilize adaptive strategies to maximize functional performance in preparation for safe transition and integration back into school, work, and the community.    Time 4    Period Weeks    Status New    Target Date 09/24/21      OT SHORT TERM GOAL #2   Title Pt will identify and implement 1-3 sleep hygiene strategies she can utilize, in order to improve sleep quality/ADL performance, in preparation for safe and healthy reintegration back into the community at discharge.    Time 4    Period Weeks    Status New    Target Date 09/24/21      OT SHORT TERM GOAL #3   Title Pt will identify 1-3 stress management strategies she can utilize, in order to safely manage increased psychosocial stressors identified, with min cues, in preparation for safe transition back to the community at discharge.    Time 4    Status New    Target Date 09/24/21           Occupational Therapy Assessment 08/27/2021  Michelle Clements is a 55 y/o female with PMHx of bipolar disorder who was referred to the [facility] program from IOP after a recent hospitalization following an overdose suicide attempt and worsening depression. Pt reports that her mother passed away in the beginning of September of this year and this was the onset of depressive symptoms and several stressors involving her affairs/family conflict. Pt lives at home with her husband, 94 y/o daughter Michelle Clements and her husband. Pt does not work and denies having any hobbies/interests. Pt reports desire to engage in [place] programming in order to manage identified stressors and to engage meaningfully in identified ares of occupation and ADL/iADLs. Upon approach, pt presents as calm, cooperative, and forthcoming with information for OT evaluation. Pt reports enjoying singing and going to church and identifies goal for admission "manage stress".    Precautions/Limitations: None noted/observed  Cognition: WFL   Visual Motor: Wears glasses   Living Situation: Pt lives with husband; 28 yo daughter Michelle Clements and her husband recently moved in while their house is being renovated  School/Work: Pt is unemployed, takes care of the house  ADL/iADL Performance: Pt engaging in self-care/hygiene, reports loss of energy/motivation  Leisure Financial [employer] and Hobbies: Enjoys singing and attending church  Social Support: Seeks support from her husband, daughter Michelle Clements, and therapist Vickii Chafe   What do you do when you are very stressed, angry, upset, sad or anxious? Isolate from others, Cry, and Sleep   What helps when you are not feeling well? Wrapping in a blanket, Calling my therapist, Having a warm or cool drink, Watching TV, Pacing the hall, and Eating something  What are some things that make it MORE difficult for you when you are already upset? Being alone/isolated, Noise (in general), Not being able to express my opinion, Loud noises, People staring at me, Being criticized, and Particular time of year  Is there anything specific that you would like help with while you're in the partial hospitalization program? Coping Skills, Impulse Control, Relationships, Communication, Medication , Stress Management, Sleep, Exercise, and Self-esteem   What is your goal while you are here?  Stress management  Assessment: Pt demonstrates behavior that inhibits/restricts participation in occupation and would benefit from skilled occupational therapy services to address current difficulties with symptom management, emotion regulation, socialization, stress management, time management, job readiness, financial wellness, health and nutrition, sleep hygiene, ADL/iADL performance and leisure participation, in preparation for reintegration and return to community at discharge.   Plan: Pt will participate in skilled occupational therapy sessions (group and/or individual)  in order to promote daily structure, social engagement, and opportunities to develop and utilize adaptive strategies to maximize functional performance in preparation for safe transition and integration back into school, work, and/or the community at discharge. OT sessions will occur 4-5 x per week for 2-4 weeks.   [employer], MOT, OTR/L  Group Session:  S: "I worry about a lot of other people's problems and carry that around with me on my back"  O: Group session encouraged increased participation and engagement through discussion focused on worry and our circle of control. Group reviewed a PowerPoint that discussed healthy vs unhealthy worry with specific examples provided. Discussion also focused on utilizing the circle of control outline to identify what is within our control, what we have influence on, and what is not in our control. Group members shared specific examples and worries and identified what categories they fell in within the circle of control.   A: Jemila was active and independent in her participation of discussion and activity, sharing that she struggles to "control" everything and in doing so, often worries about other people's problems and issues. Victory was actively engaged, sharing one of her worries is the black mold problem at her daughter's house. Pt was able to recognize that this is something she cannot control. Appeared receptive to education and information received on circle of control tool.   P: Continue to attend PHP OT group sessions 5x week for 2 weeks to promote daily structure, social engagement, and opportunities to develop and utilize adaptive strategies to maximize functional performance in preparation for safe transition and integration back into school, work, and the community. Plan to address topic of stress management in next OT group session.  Plan - 08/27/21 1435     Clinical Impression Statement Michelle Clements is a 55 y/o female with PMHx of bipolar disorder  who was referred to the [facility] program from IOP after a recent hospitalization following an overdose suicide attempt and worsening depression. Pt reports that her mother passed away in the beginning of September of this year and this was the onset of depressive symptoms and several stressors involving her affairs/family conflict.  Pt lives at home with her husband, 54 y/o daughter Michelle Clements and her husband. Pt does not work and denies having any hobbies/interests. Pt reports desire to engage in [place] programming in order to manage identified stressors and to engage meaningfully in identified ares of occupation and ADL/iADLs.    OT Occupational Profile and History Problem Focused Assessment - Including review of records relating to presenting problem    Occupational performance deficits (Please refer to evaluation for details): ADL's;IADL's;Rest and Sleep;Education;Work;Leisure;Social Participation    Body Structure / Function / Physical Skills ADL    Cognitive Skills Attention;Emotional;Energy/Drive;Learn;Memory;Perception;Problem Solve;Safety Awareness;Temperament/Personality;Thought;Understand    Psychosocial Skills Coping Strategies;Environmental  Adaptations;Habits;Interpersonal Interaction;Routines and Behaviors    Rehab Potential Good    Clinical Decision Making Limited treatment options, no task modification necessary    Comorbidities Affecting Occupational Performance: May have comorbidities impacting occupational performance    Modification or Assistance to Complete Evaluation  No modification of tasks or assist necessary to complete eval    OT Frequency 5x / week    OT Duration 4 weeks    OT Treatment/Interventions Self-care/ADL training;Patient/family education;Coping strategies training;Psychosocial skills training    Consulted and Agree with Plan of Care Patient             Patient will benefit from skilled therapeutic intervention in order to improve the following deficits and impairments:    Body Structure / Function / Physical Skills: ADL Cognitive Skills: Attention, Emotional, Energy/Drive, Learn, Memory, Perception, Problem Solve, Safety Awareness, Temperament/Personality, Thought, Understand Psychosocial Skills: Coping Strategies, Environmental  Adaptations, Habits, Interpersonal Interaction, Routines and Behaviors   Visit Diagnosis: Difficulty coping  Frontal lobe and executive function deficit  Bipolar 1 disorder [facility])    Problem List Patient Active Problem List   Diagnosis Date Noted   Migraine 10/28/2014   Neck pain of over 3 months duration 10/28/2014   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[place]) 07/29/2014   Headache(784.0) 11/06/2013   Cervicalgia 11/06/2013   Acute confusional state 11/06/2013   History of stroke in prior 3 months 10/15/2013   Neuropathy of upper extremity 04/23/2013   Unspecified vitamin D deficiency 02/13/2009   Morbid obesity ([place]) 02/13/2009   BIPOLAR AFFECTIVE DISORDER 11/19/2007   Essential hypertension, benign 11/16/2007   Mixed hyperlipidemia 10/03/2007   Hx of tobacco use, presenting hazards to health 10/03/2007   DEPRESSION 10/03/2007   IBS 10/03/2007   FIBROMYALGIA 10/03/2007   Palpitations 10/03/2007    08/27/2021  [name], MOT, OTR/L  08/27/2021, 2:40 PM  [place], [place], [address] Phone: [phone]   Fax:  [phone]  Name: NICKIA BOESEN MRN: [id number] Date of Birth: 04-13-1966

## 2021-08-29 ENCOUNTER — Encounter (HOSPITAL_COMMUNITY): Payer: Self-pay | Admitting: Psychiatry

## 2021-08-30 ENCOUNTER — Ambulatory Visit (HOSPITAL_COMMUNITY): Payer: HMO

## 2021-08-30 ENCOUNTER — Other Ambulatory Visit (HOSPITAL_COMMUNITY): Payer: HMO | Admitting: Occupational Therapy

## 2021-08-30 ENCOUNTER — Other Ambulatory Visit (HOSPITAL_COMMUNITY): Payer: HMO | Admitting: Professional

## 2021-08-30 ENCOUNTER — Other Ambulatory Visit: Payer: Self-pay

## 2021-08-30 ENCOUNTER — Encounter (HOSPITAL_COMMUNITY): Payer: Self-pay | Admitting: Occupational Therapy

## 2021-08-30 ENCOUNTER — Encounter (HOSPITAL_COMMUNITY): Payer: Self-pay

## 2021-08-30 DIAGNOSIS — R41844 Frontal lobe and executive function deficit: Secondary | ICD-10-CM

## 2021-08-30 DIAGNOSIS — F319 Bipolar disorder, unspecified: Secondary | ICD-10-CM

## 2021-08-30 DIAGNOSIS — F332 Major depressive disorder, recurrent severe without psychotic features: Secondary | ICD-10-CM

## 2021-08-30 DIAGNOSIS — R4589 Other symptoms and signs involving emotional state: Secondary | ICD-10-CM

## 2021-08-30 NOTE — Progress Notes (Signed)
Spoke with patient via Webex video call, used 2 identifiers to correctly identify patient. This is her first time in [facility] after being referred from [facility]. She spent 8 days in- patient after an OD of Hydrocodone. Her mother died Aug 14, 2023. She was the main caretaker for mother and after her death her sister was wanting to control what happened for the funeral. Also had an argument with her oldest daughter about not including her ex-husband in the funeral. She went to her room and took an entire bottle of Hydrocodone but knew her husband would come check on her. That was her first suicide attempt. She does not want to die and no longer feels suicidal. Denies HI or AV hallucinations. She is having trouble sleeping and would like something to help her sleep. Message sent to NP for discussion. On scale 1-10 as 10 being worst she rates depression at 3 and anxiety at 5. PHQ9=11. No issues or complaints. No side effects from medications.

## 2021-08-30 NOTE — Therapy (Signed)
[facility] [place], [place], [address] Phone: [phone]   Fax:  [phone] Virtual Visit via Video Note  I connected with [name] on 08/30/21 at  11:00 AM EDT by a video enabled telemedicine application and verified that I am speaking with the correct person using two identifiers.  Location: Patient: Patient Home Provider: Clinic Office   I discussed the limitations of evaluation and management by telemedicine and the availability of in person appointments. The patient expressed understanding and agreed to proceed.   I discussed the assessment and treatment plan with the patient. The patient was provided an opportunity to ask questions and all were answered. The patient agreed with the plan and demonstrated an understanding of the instructions.   The patient was advised to call back or seek an in-person evaluation if the symptoms worsen or if the condition fails to improve as anticipated.  I provided 60 minutes of non-face-to-face time during this encounter.   [name], OT/L   Occupational Therapy Treatment  Patient Details  Name: Michelle Clements MRN: [id number] Date of Birth: 06-06-1966 Referring Provider (OT): Ricky Ala   Encounter Date: 08/30/2021   OT End of Session - 08/30/21 1308     Visit Number 2    Number of Visits 20    Date for OT Re-Evaluation 09/24/21    Authorization Type [employer]    OT Start Time 1105    OT Stop Time 1205    OT Time Calculation (min) 60 min    Activity Tolerance Patient tolerated treatment well    Behavior During Therapy WFL for tasks assessed/performed             Past Medical History:  Diagnosis Date   Allergic rhinitis    Anemia    Arthritis    Lower back and hips   Asthma    Bipolar disorder ([place])    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[place])    Constipation    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[place])    Depression     Essential hypertension, benign    Fibromyalgia    GERD (gastroesophageal reflux disease)    H/O sleep apnea    Hip pain, left    History of palpitations    Negative Holter monitor   History of pneumonia 1988   Hyperlipidemia    IBS (irritable bowel syndrome)    Neck pain    Poor short term memory    PTSD (post-traumatic stress disorder)    S/P Botox injection 11/2014    For migraine headaches   Type 2 diabetes mellitus ([place])    Urgency of urination     Past Surgical History:  Procedure Laterality Date   CARPAL TUNNEL RELEASE Right 06/10/2013   Procedure: CARPAL TUNNEL RELEASE;  Surgeon: Faythe Ghee, MD;  Location: [facility];  Service: Neurosurgery;  Laterality: Right;  Right Carpal Tunnel Release    CHOLECYSTECTOMY     DENTAL SURGERY  12/2014   mole exc     x 3   MUSCLE BIOPSY  2008   Right leg   TUBAL LIGATION      There were no vitals filed for this visit.   Subjective Assessment - 08/30/21 1308     Currently in Pain? No/denies  OT Education - 08/30/21 1308     Education Details Educated on tips and strategies to improve overall self-care    Person(s) Educated Patient    Methods Explanation;Handout    Comprehension Verbalized understanding              OT Short Term Goals - 08/30/21 1309       OT SHORT TERM GOAL #1   Status On-going      OT SHORT TERM GOAL #2   Status On-going      OT SHORT TERM GOAL #3   Status On-going           Group Session:  S: "I put people before myself in every single situation. I will help my daughters and my husband before I help myself, that's just the type of person I am."  O: Today's group session focused on topic of self-care and group reviewed a self-care assessment that identified a specific category within self-care that they needed the most improvement in, including physical, emotional/psychological, social, spiritual, and professional self-care.  Discussion focused on members sharing both areas of strength and areas of improvement. Members were encouraged to brainstorm together to identify specific strategies to overcome and work through the areas of improvement.    A: Michelle Clements was active in her participation of discussion and activity, sharing that she struggles to engage in self-care, because she has been taught early on that it is selfish and her role is to take care of others BEFORE herself. Pt appeared receptive to the idea that self-care is not selfish, however noted it is something she will actively have to practice to unlearn. Pt shared that she does not have any hobbies and finding a hobby or trying something new would be an area of growth and improvement for her emotional self-care. Pt identified eating regularly and in line with her diabetic precautions as a current physical self-care strength.   P: Continue to attend PHP OT group sessions 5x week for 2 weeks to promote daily structure, social engagement, and opportunities to develop and utilize adaptive strategies to maximize functional performance in preparation for safe transition and integration back into school, work, and the community.   Plan - 08/30/21 1308     Occupational performance deficits (Please refer to evaluation for details): ADL's;IADL's;Rest and Sleep;Education;Work;Leisure;Social Participation    Body Structure / Function / Physical Skills ADL    Cognitive Skills Attention;Emotional;Energy/Drive;Learn;Memory;Perception;Problem Solve;Safety Awareness;Temperament/Personality;Thought;Understand    Psychosocial Skills Coping Strategies;Environmental  Adaptations;Habits;Interpersonal Interaction;Routines and Behaviors             Patient will benefit from skilled therapeutic intervention in order to improve the following deficits and impairments:   Body Structure / Function / Physical Skills: ADL Cognitive Skills: Attention, Emotional, Energy/Drive, Learn, Memory,  Perception, Problem Solve, Safety Awareness, Temperament/Personality, Thought, Understand Psychosocial Skills: Coping Strategies, Environmental  Adaptations, Habits, Interpersonal Interaction, Routines and Behaviors   Visit Diagnosis: Difficulty coping  Frontal lobe and executive function deficit  Bipolar 1 disorder [facility])    Problem List Patient Active Problem List   Diagnosis Date Noted   Migraine 10/28/2014   Neck pain of over 3 months duration 10/28/2014   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[place]) 07/29/2014   Headache(784.0) 11/06/2013   Cervicalgia 11/06/2013   Acute confusional state 11/06/2013   History of stroke in prior 3 months 10/15/2013   Neuropathy of upper extremity 04/23/2013   Unspecified vitamin D deficiency 02/13/2009   Morbid obesity ([place]) 02/13/2009   BIPOLAR AFFECTIVE DISORDER 11/19/2007  Essential hypertension, benign 11/16/2007   Mixed hyperlipidemia 10/03/2007   Hx of tobacco use, presenting hazards to health 10/03/2007   DEPRESSION 10/03/2007   IBS 10/03/2007   FIBROMYALGIA 10/03/2007   Palpitations 10/03/2007    08/30/2021  [name], MOT, OTR/L  08/30/2021, 1:09 PM  [place], [place], [address] Phone: [phone]   Fax:  [phone]  Name: TABBY BEASTON MRN: [id number] Date of Birth: December 23, 1965

## 2021-08-31 ENCOUNTER — Other Ambulatory Visit: Payer: Self-pay

## 2021-08-31 ENCOUNTER — Ambulatory Visit (HOSPITAL_COMMUNITY): Payer: HMO

## 2021-08-31 ENCOUNTER — Other Ambulatory Visit (HOSPITAL_COMMUNITY): Payer: HMO | Admitting: Occupational Therapy

## 2021-08-31 ENCOUNTER — Encounter (HOSPITAL_COMMUNITY): Payer: Self-pay

## 2021-08-31 ENCOUNTER — Other Ambulatory Visit (HOSPITAL_COMMUNITY): Payer: HMO | Admitting: Professional

## 2021-08-31 DIAGNOSIS — F332 Major depressive disorder, recurrent severe without psychotic features: Secondary | ICD-10-CM

## 2021-08-31 DIAGNOSIS — F319 Bipolar disorder, unspecified: Secondary | ICD-10-CM | POA: Diagnosis not present

## 2021-08-31 DIAGNOSIS — R41844 Frontal lobe and executive function deficit: Secondary | ICD-10-CM

## 2021-08-31 DIAGNOSIS — R4589 Other symptoms and signs involving emotional state: Secondary | ICD-10-CM

## 2021-08-31 NOTE — Therapy (Signed)
[address] [place], [place], [address] Phone: [phone]   Fax:  [phone] Virtual Visit via Video Note  I connected with Michelle Clements on 08/31/21 at  12:15 PM EDT by a video enabled telemedicine application and verified that I am speaking with the correct person using two identifiers.  Location: Patient: Patient Home Provider: Clinic Office    I discussed the limitations of evaluation and management by telemedicine and the availability of in person appointments. The patient expressed understanding and agreed to proceed.   I discussed the assessment and treatment plan with the patient. The patient was provided an opportunity to ask questions and all were answered. The patient agreed with the plan and demonstrated an understanding of the instructions.   The patient was advised to call back or seek an in-person evaluation if the symptoms worsen or if the condition fails to improve as anticipated.  I provided 35 minutes of non-face-to-face time during this encounter.   [name], OT/L   Occupational Therapy Treatment  Patient Details  Name: Michelle Clements MRN: [id number] Date of Birth: [birth year] Referring Provider (OT): Ricky Ala   Encounter Date: 08/31/2021   OT End of Session - 08/31/21 1300     Visit Number 3    Number of Visits 20    Date for OT Re-Evaluation 09/24/21    Authorization Type [employer]    OT Start Time 1215    OT Stop Time 1250    OT Time Calculation (min) 35 min    Activity Tolerance Patient tolerated treatment well    Behavior During Therapy WFL for tasks assessed/performed             Past Medical History:  Diagnosis Date   Allergic rhinitis    Anemia    Arthritis    Lower back and hips   Asthma    Bipolar disorder ([place])    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[place])    Constipation    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[place])    Depression     Essential hypertension, benign    Fibromyalgia    GERD (gastroesophageal reflux disease)    H/O sleep apnea    Hip pain, left    History of palpitations    Negative Holter monitor   History of pneumonia 1988   Hyperlipidemia    IBS (irritable bowel syndrome)    Neck pain    Poor short term memory    PTSD (post-traumatic stress disorder)    S/P Botox injection 11/2014    For migraine headaches   Type 2 diabetes mellitus ([place])    Urgency of urination     Past Surgical History:  Procedure Laterality Date   CARPAL TUNNEL RELEASE Right 06/10/2013   Procedure: CARPAL TUNNEL RELEASE;  Surgeon: Faythe Ghee, MD;  Location: [facility];  Service: Neurosurgery;  Laterality: Right;  Right Carpal Tunnel Release    CHOLECYSTECTOMY     DENTAL SURGERY  12/2014   mole exc     x 3   MUSCLE BIOPSY  2008   Right leg   TUBAL LIGATION      There were no vitals filed for this visit.   Subjective Assessment - 08/31/21 1300     Currently in Pain? No/denies  OT Education - 08/31/21 1300     Education Details Educated on physical symptomology of stress and its effects on the body, along with positive stress management tips/strategies    Person(s) Educated Patient    Methods Explanation;Handout    Comprehension Verbalized understanding              OT Short Term Goals - 08/30/21 1309       OT SHORT TERM GOAL #1   Status On-going      OT SHORT TERM GOAL #2   Status On-going      OT SHORT TERM GOAL #3   Status On-going           Group Session:  S: "Sometimes I just crawl into the bed to try and forget."  O: Today's discussion focused on the topic of stress management. Group members worked collaboratively to create a Environmental consultant identifying physical signs, behavioral signs, emotional/psychological, and cognitive signs of stress. Discussion then focused and encouraged group members to identify positive  stress management strategies they could utilize in those moments to manage identified signs.   A: [name] was active and independent in her participation of discussion and activity, sharing actively in group brainstorm. Pt shared how stress presents itself for her, both in physical signs and emotional responses. Pt identified "isolation" as a negative strategy she has used in the past to manage, along with sleeping. Appeared open and receptive to education and review of positive stress management techniques.   P: Continue to attend PHP OT group sessions 5x week for 1 weeks to promote daily structure, social engagement, and opportunities to develop and utilize adaptive strategies to maximize functional performance in preparation for safe transition and integration back into school, work, and the community.    Plan - 08/31/21 1301     Occupational performance deficits (Please refer to evaluation for details): ADL's;IADL's;Rest and Sleep;Education;Work;Leisure;Social Participation    Body Structure / Function / Physical Skills ADL    Cognitive Skills Attention;Emotional;Energy/Drive;Learn;Memory;Perception;Problem Solve;Safety Awareness;Temperament/Personality;Thought;Understand    Psychosocial Skills Coping Strategies;Environmental  Adaptations;Habits;Interpersonal Interaction;Routines and Behaviors             Patient will benefit from skilled therapeutic intervention in order to improve the following deficits and impairments:   Body Structure / Function / Physical Skills: ADL Cognitive Skills: Attention, Emotional, Energy/Drive, Learn, Memory, Perception, Problem Solve, Safety Awareness, Temperament/Personality, Thought, Understand Psychosocial Skills: Coping Strategies, Environmental  Adaptations, Habits, Interpersonal Interaction, Routines and Behaviors   Visit Diagnosis: Difficulty coping  Frontal lobe and executive function deficit  Bipolar 1 disorder [facility])    Problem  List Patient Active Problem List   Diagnosis Date Noted   Migraine 10/28/2014   Neck pain of over 3 months duration 10/28/2014   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[place]) 07/29/2014   Headache(784.0) 11/06/2013   Cervicalgia 11/06/2013   Acute confusional state 11/06/2013   History of stroke in prior 3 months 10/15/2013   Neuropathy of upper extremity 04/23/2013   Unspecified vitamin D deficiency 02/13/2009   Morbid obesity ([place]) 02/13/2009   BIPOLAR AFFECTIVE DISORDER 11/19/2007   Essential hypertension, benign 11/16/2007   Mixed hyperlipidemia 10/03/2007   Hx of tobacco use, presenting hazards to health 10/03/2007   DEPRESSION 10/03/2007   IBS 10/03/2007   FIBROMYALGIA 10/03/2007   Palpitations 10/03/2007    08/31/2021  [name], MOT, OTR/L  08/31/2021, 1:01 PM  [facility] PARTIAL HOSPITALIZATION PROGRAM [place], [place], [address] Phone: [phone]  Fax:  [phone]  Name: MASON BURLEIGH MRN: [id number] Date of Birth: May 20, 1966

## 2021-09-01 ENCOUNTER — Encounter (HOSPITAL_COMMUNITY): Payer: Self-pay | Admitting: Family

## 2021-09-01 ENCOUNTER — Other Ambulatory Visit: Payer: Self-pay

## 2021-09-01 ENCOUNTER — Other Ambulatory Visit (HOSPITAL_COMMUNITY): Payer: HMO

## 2021-09-01 ENCOUNTER — Telehealth (HOSPITAL_COMMUNITY): Payer: Self-pay | Admitting: Psychiatry

## 2021-09-01 ENCOUNTER — Ambulatory Visit (HOSPITAL_COMMUNITY): Payer: HMO

## 2021-09-01 DIAGNOSIS — M797 Fibromyalgia: Secondary | ICD-10-CM | POA: Diagnosis not present

## 2021-09-01 DIAGNOSIS — M545 Low back pain, unspecified: Secondary | ICD-10-CM | POA: Diagnosis not present

## 2021-09-01 DIAGNOSIS — F319 Bipolar disorder, unspecified: Secondary | ICD-10-CM | POA: Diagnosis not present

## 2021-09-01 DIAGNOSIS — N3010 Interstitial cystitis (chronic) without hematuria: Secondary | ICD-10-CM | POA: Diagnosis not present

## 2021-09-01 DIAGNOSIS — R5382 Chronic fatigue, unspecified: Secondary | ICD-10-CM | POA: Diagnosis not present

## 2021-09-01 DIAGNOSIS — M542 Cervicalgia: Secondary | ICD-10-CM | POA: Diagnosis not present

## 2021-09-01 DIAGNOSIS — G894 Chronic pain syndrome: Secondary | ICD-10-CM | POA: Diagnosis not present

## 2021-09-01 DIAGNOSIS — J309 Allergic rhinitis, unspecified: Secondary | ICD-10-CM | POA: Diagnosis not present

## 2021-09-01 DIAGNOSIS — D849 Immunodeficiency, unspecified: Secondary | ICD-10-CM | POA: Diagnosis not present

## 2021-09-01 DIAGNOSIS — J4540 Moderate persistent asthma, uncomplicated: Secondary | ICD-10-CM | POA: Diagnosis not present

## 2021-09-01 DIAGNOSIS — M5416 Radiculopathy, lumbar region: Secondary | ICD-10-CM | POA: Diagnosis not present

## 2021-09-01 DIAGNOSIS — G4733 Obstructive sleep apnea (adult) (pediatric): Secondary | ICD-10-CM | POA: Diagnosis not present

## 2021-09-01 DIAGNOSIS — Z23 Encounter for immunization: Secondary | ICD-10-CM | POA: Diagnosis not present

## 2021-09-01 DIAGNOSIS — G5620 Lesion of ulnar nerve, unspecified upper limb: Secondary | ICD-10-CM | POA: Diagnosis not present

## 2021-09-01 DIAGNOSIS — E782 Mixed hyperlipidemia: Secondary | ICD-10-CM | POA: Diagnosis not present

## 2021-09-01 DIAGNOSIS — Z0001 Encounter for general adult medical examination with abnormal findings: Secondary | ICD-10-CM | POA: Diagnosis not present

## 2021-09-01 DIAGNOSIS — M5412 Radiculopathy, cervical region: Secondary | ICD-10-CM | POA: Diagnosis not present

## 2021-09-01 DIAGNOSIS — M47896 Other spondylosis, lumbar region: Secondary | ICD-10-CM | POA: Diagnosis not present

## 2021-09-01 DIAGNOSIS — R945 Abnormal results of liver function studies: Secondary | ICD-10-CM | POA: Diagnosis not present

## 2021-09-01 DIAGNOSIS — G5603 Carpal tunnel syndrome, bilateral upper limbs: Secondary | ICD-10-CM | POA: Diagnosis not present

## 2021-09-01 NOTE — Progress Notes (Signed)
Virtual Visit via Video Note  I connected with Michelle Clements on 09/01/21 at  9:00 AM EDT by a video enabled telemedicine application and verified that I am speaking with the correct person using two identifiers.  Location: Patient: Home Provider: Office   I discussed the limitations of evaluation and management by telemedicine and the availability of in person appointments. The patient expressed understanding and agreed to proceed    I discussed the assessment and treatment plan with the patient. The patient was provided an opportunity to ask questions and all were answered. The patient agreed with the plan and demonstrated an understanding of the instructions.   The patient was advised to call back or seek an in-person evaluation if the symptoms worsen or if the condition fails to improve as anticipated.  I provided 15 minutes of non-face-to-face time during this encounter.   Derrill Center, NP   [place] MD/PA/NP OP Progress Note  09/01/2021 11:18 AM Michelle Clements  MRN:  [id number]  Chief Complaint:   Michelle Clements reported " I get really anxious and worked up."   Evaluation: Michelle Clements was seen and evaluated via tele-assessment.  She presents with a bright and pleasant affect.  Denying suicidal or homicidal ideations.  Denies auditory or visual hallucinations.  Reports mild anxiety related to starting group setting. "  I noticed that this group was more reactive relaxed than I anticipated. "  Reports she has been learning a lot of coping skills since attending group of the past 2 days.  "  I have been dealing with stress a lot better than before."  rating her depression 3 out of 10 with 10 being the worst.  Anxiety 8 out of 10 with 10 being the worst.  Anella reported she is sleeping much "better".  States she has a follow-up appointment with her pain management doctor as she continues to ruminate about projected pain during the winter months.  States she is currently prescribed Lyrica 3 times daily.   States she has been working on Radiographer, therapeutic.  Patient to continue with daily group sessions.  Support,encouragement reassurance was provided.   Visit Diagnosis:    ICD-10-CM   1. Major depressive disorder, recurrent, severe without psychotic features ([place])  F33.2     2. 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[place])    Depression    Essential hypertension, benign    Fibromyalgia    GERD (gastroesophageal reflux disease)    H/O sleep apnea    Hip pain, left    History of palpitations    Negative Holter monitor   History of pneumonia 1988   Hyperlipidemia    IBS (irritable bowel syndrome)    Neck pain    Poor short term memory    PTSD (post-traumatic stress disorder)    S/P Botox injection 11/2014    For migraine headaches   Type 2 diabetes mellitus ([place])    Urgency of urination     Past Surgical History:  Procedure Laterality Date   CARPAL TUNNEL RELEASE Right 06/10/2013   Procedure: CARPAL TUNNEL RELEASE;  Surgeon: Olga Coaster Kritzer,  MD;  Location: [facility];  Service: Neurosurgery;  Laterality: Right;  Right Carpal Tunnel Release    CHOLECYSTECTOMY     DENTAL SURGERY  12/2014   mole exc     x 3   MUSCLE BIOPSY  2008   Right leg   TUBAL LIGATION      Family Psychiatric History:  Family History:  Family History  Problem Relation Age of Onset   Fibromyalgia Mother    Diabetes type II Mother    Depression Mother    Alzheimer's disease Mother    GER disease Mother    Heart disease Mother    Paranoid behavior Mother    Dementia Mother    Heart attack Father 108       MI x5   Stroke Father        CVA x7   Asthma Father    Brain cancer Father    Seizures Father     Anxiety disorder Sister    OCD Sister    Sexual abuse Sister    Physical abuse Sister    Anxiety disorder Sister    ADD / ADHD Daughter    ADD / ADHD Daughter    Alcohol abuse Neg Hx    Drug abuse Neg Hx    Schizophrenia Neg Hx     Social History:  Social History   Socioeconomic History   Marital status: Married    Spouse name: Not on file   Number of children: 2   Years of education: College   Highest education level: Associate degree: academic program  Occupational History   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   Caffeine use: 2 cups coffee in morning and 2 cups tea during the day    Social Determinants of Health   Financial Resource Strain: Low Risk    Difficulty of Paying Living Expenses: Not very hard  Food Insecurity: No Food Insecurity   Worried About Charity fundraiser in the Last Year: Never true   Ran Out of Food in the Last Year: Never true  Transportation Needs: No Transportation Needs   Lack of Transportation (Medical): No   Lack of Transportation (Non-Medical): No  Physical Activity: Inactive   Days of Exercise per Week: 0 days   Minutes of Exercise per Session: 0 min  Stress: No Stress Concern Present   Feeling of Stress : Not at all  Social Connections: Moderately Integrated   Frequency of Communication with Friends and Family: More than three times a week   Frequency of Social Gatherings with Friends and Family: More  than three times a week   Attends Religious Services: More than 4 times per year   Active Member of [employer] or Organizations: No   Attends Archivist Meetings: Never   Marital Status: Married  Allergies:  Allergies  Allergen Reactions   Molds & Smuts Shortness Of Breath    wheezing   Other Anaphylaxis, Shortness Of Breath and Other (See Comments)     : Angioedema wheezing Wheezing wheezing Other reaction(s): Cough Wheezing Itching and rash Wheezing wheezing Wheezing wheezing Other reaction(s): Other (See Comments)  : Angioedema wheezing Wheezing wheezing Other reaction(s): Cough Wheezing Itching and rash Wheezing wheezing Other reaction(s): Other (See Comments) Wheezing wheezing  : Angioedema wheezing Wheezing wheezing Other reaction(s): Cough Wheezing Itching and rash Wheezing wheezing Wheezing wheezing   Statins Anaphylaxis   Sulfa Antibiotics Anaphylaxis     : Angioedema    Sulfonamide Derivatives Anaphylaxis     : Angioedema   Trichophyton Shortness Of Breath    wheezing wheezing wheezing wheezing wheezing wheezing wheezing wheezing wheezing   Carbamazepine Rash   Dust Mite Extract Cough and Other (See Comments)    Wheezing Other reaction(s): Wheezing Wheezing Wheezing Other reaction(s): Wheezing Wheezing Wheezing Other reaction(s): Wheezing Wheezing   Gluten Meal Itching   Clonazepam Other (See Comments)    Confusion and memory loss Caused nconfusion    Metabolic Disorder Labs: Lab Results  Component Value Date   HGBA1C 5.7 08/25/2021   MPG 131 03/18/2015   No results found for: PROLACTIN Lab Results  Component Value Date   CHOL 134 12/29/2020   TRIG 185 (A) 12/29/2020   HDL 39 12/29/2020   CHOLHDL 4.2 03/18/2015   VLDL 44 (H) 03/18/2015   LDLCALC 64 12/29/2020   LDLCALC 89 08/26/2020   Lab Results  Component Value Date   TSH 2.69 12/29/2020   TSH 1.66 09/14/2020    Therapeutic Level Labs: Lab  Results  Component Value Date   LITHIUM 0.20 (L) 02/21/2012   No results found for: VALPROATE No components found for:  CBMZ  Current Medications: Current Outpatient Medications  Medication Sig Dispense Refill   albuterol (PROVENTIL HFA;VENTOLIN HFA) 108 (90 BASE) MCG/ACT inhaler Inhale 2 puffs into the lungs every 6 (six) hours as needed for wheezing or shortness of breath.     Ascorbic Acid (VITAMIN C) 1000 MG tablet Take 1,000 mg by mouth daily. (Patient not taking: Reported on 08/30/2021)     Blood Glucose Monitoring Suppl ([place]) w/Device KIT See admin instructions.     buPROPion (WELLBUTRIN XL) 150 MG 24 hr tablet Take 1 tablet (150 mg total) by mouth every morning. 30 tablet 2   cetirizine (ZYRTEC) 10 MG tablet Take 10 mg by mouth at bedtime.     Cholecalciferol (VITAMIN D) 50 MCG (2000 UT) tablet Take 2,000 Units by mouth daily.     clonazePAM (KLONOPIN) 0.5 MG tablet Take 1 tablet (0.5 mg total) by mouth 2 (two) times daily as needed for anxiety. 60 tablet 2   clonazePAM (KLONOPIN) 1 MG tablet Take 1 tablet (1 mg total) by mouth 2 (two) times daily as needed for anxiety. 60 tablet 2   estradiol (ESTRACE) 0.1 MG/GM vaginal cream Place vaginally. (Patient not taking: Reported on 08/30/2021)     FLUoxetine (PROZAC) 40 MG capsule Take 1 capsule (40 mg total) by mouth daily. 30 capsule 2   fluticasone (FLONASE) 50 MCG/ACT nasal spray Place 1 spray into both nostrils daily.     Glucos-Chond-Hyal Ac-Ca Fructo (MOVE FREE JOINT HEALTH ADVANCE PO) Take by mouth.     ibuprofen (ADVIL) 800 MG tablet Take 800 mg by mouth 2 (two) times daily.     insulin glargine (LANTUS SOLOSTAR) 100 UNIT/ML Solostar  Pen Inject 50 Units into the skin daily.     Lancets (ONETOUCH ULTRASOFT) lancets 1 each by Other route as needed.      lubiprostone (AMITIZA) 24 MCG capsule TAKE ONE CAPSULE BY MOUTH EVERY MORNING and TAKE ONE CAPSULE BY MOUTH EVERY EVENING     Melatonin 5 MG TABS Take 5 mg by  mouth at bedtime as needed.      Multiple Vitamin (MULTIVITAMIN) tablet Take 1 tablet by mouth daily.     naproxen sodium (ALEVE) 220 MG tablet Take 220 mg by mouth 2 (two) times daily as needed. (Patient not taking: Reported on 08/30/2021)     omeprazole (PRILOSEC) 20 MG capsule Take 20 mg by mouth daily.     ONETOUCH VERIO test strip 4 (four) times daily.     pregabalin (LYRICA) 200 MG capsule Take 200 mg by mouth 3 (three) times daily.     rosuvastatin (CRESTOR) 20 MG tablet Take 1 tablet (20 mg total) by mouth at bedtime. 90 tablet 3   sennosides-docusate sodium (SENOKOT-S) 8.6-50 MG tablet Take 1 tablet by mouth daily.     tiZANidine (ZANAFLEX) 4 MG tablet Take 4 mg by mouth daily. 27m at bedtime and 2 mg during the day     vitamin B-12 (CYANOCOBALAMIN) 100 MCG tablet Take 100 mcg by mouth daily. (Patient not taking: Reported on 08/30/2021)     zinc gluconate 50 MG tablet Take 50 mg by mouth daily. (Patient not taking: Reported on 08/30/2021)     No current facility-administered medications for this visit.     Musculoskeletal: Strength & Muscle Tone: within normal limits Gait & Station: normal Patient leans: N/A  Psychiatric Specialty Exam: Review of Systems  Last menstrual period 01/04/2018.There is no height or weight on file to calculate BMI.  General Appearance: Casual  Eye Contact:  Good  Speech:  Clear and Coherent  Volume:  Normal  Mood:  Anxious and Depressed  Affect:  Congruent  Thought Process:  Coherent  Orientation:  NA  Thought Content: Logical   Suicidal Thoughts:  No  Homicidal Thoughts:  No  Memory:  Immediate;   Good Recent;   Good  Judgement:  Good  Insight:  Good  Psychomotor Activity:  Normal  Concentration:  Concentration: Good  Recall:  [place] Knowledge: Good  Language: Good  Akathisia:  No  Handed:  Right  AIMS (if indicated): not done  Assets:  Communication Skills Desire for Improvement Resilience Social Support  ADL's:  Intact   Cognition: WNL  Sleep:  Fair   Screenings: Mini-Mental    [facility]  Total Score (max 30 points ) 27      PHQ2-9    Flowsheet Row Counselor from 08/30/2021 in [facility] Visit from [facility]/19/2021 [facility] Video Visit from 08/06/2021 in [facility] Visit from 07/27/2021 in [facility] Visit from 07/05/2021 in [facility]  PHQ-2 Total Score 2 3 6  0 0  PHQ-9 Total Score 11 5 13  -- --      Flowsheet Row Video Visit from 08/19/2021 in [facility] Video Visit from 08/06/2021 in [facility] [facility] Video Visit from 07/05/2021 in BPoulanError: Q3, 4, or 5 should not be populated when Q2 is No Error: Question 6 not populated  Error: Question 6 not populated        Assessment and Plan:  Patient to continue partial hospitalization programming Continue medications as directed  Treatment plan was reviewed and agreed upon by NP T. Bobby Rumpf and patient Chondra Boyde need for continued group services.    Derrill Center, NP 09/01/2021, 11:18 AM

## 2021-09-02 ENCOUNTER — Other Ambulatory Visit (HOSPITAL_COMMUNITY): Payer: HMO | Admitting: Occupational Therapy

## 2021-09-02 ENCOUNTER — Ambulatory Visit (HOSPITAL_COMMUNITY): Payer: HMO

## 2021-09-02 ENCOUNTER — Encounter (HOSPITAL_COMMUNITY): Payer: Self-pay | Admitting: Psychiatry

## 2021-09-02 ENCOUNTER — Encounter (HOSPITAL_COMMUNITY): Payer: Self-pay

## 2021-09-02 ENCOUNTER — Other Ambulatory Visit: Payer: Self-pay

## 2021-09-02 ENCOUNTER — Other Ambulatory Visit (HOSPITAL_COMMUNITY): Payer: HMO | Admitting: Professional

## 2021-09-02 ENCOUNTER — Telehealth (HOSPITAL_COMMUNITY): Payer: HMO | Admitting: Psychiatry

## 2021-09-02 ENCOUNTER — Telehealth (INDEPENDENT_AMBULATORY_CARE_PROVIDER_SITE_OTHER): Payer: HMO | Admitting: Psychiatry

## 2021-09-02 DIAGNOSIS — F332 Major depressive disorder, recurrent severe without psychotic features: Secondary | ICD-10-CM

## 2021-09-02 DIAGNOSIS — R41844 Frontal lobe and executive function deficit: Secondary | ICD-10-CM

## 2021-09-02 DIAGNOSIS — F319 Bipolar disorder, unspecified: Secondary | ICD-10-CM | POA: Diagnosis not present

## 2021-09-02 DIAGNOSIS — R4589 Other symptoms and signs involving emotional state: Secondary | ICD-10-CM

## 2021-09-02 MED ORDER — TRAZODONE HCL 50 MG PO TABS: 50.0000 mg | ORAL_TABLET | Freq: Every day | ORAL | 2 refills | Status: DC

## 2021-09-02 NOTE — Psych (Signed)
Virtual Visit via Video Note  I connected with Michelle Clements on 08/27/21 at  9:00 AM EDT by a video enabled telemedicine application and verified that I am speaking with the correct person using two identifiers.  Location: Patient: Home Provider: Clinical Home Office   I discussed the limitations of evaluation and management by telemedicine and the availability of in person appointments. The patient expressed understanding and agreed to proceed.  Follow Up Instructions:    I discussed the assessment and treatment plan with the patient. The patient was provided an opportunity to ask questions and all were answered. The patient agreed with the plan and demonstrated an understanding of the instructions.   The patient was advised to call back or seek an in-person evaluation if the symptoms worsen or if the condition fails to improve as anticipated.  I provided 240 minutes of non-face-to-face time during this encounter.   Royetta Crochet, [facility]    [facility] [place] PHP THERAPIST PROGRESS NOTE  Michelle Clements [id number]  Session Time: 9-10  Participation Level: Active  Behavioral Response: CasualAlertAnxious and Depressed  Type of Therapy: Group Therapy  Treatment Goals addressed: Coping  Interventions: CBT, DBT, Solution Focused, Strength-based, Supportive, and Reframing  Summary: Clinician led check-in regarding current stressors and situation. Clinician utilized active listening and empathetic response and validated patient emotions. Clinician facilitated processing group on pertinent issues.  Therapist Response: Michelle Clements is a 55 y.o. female who presents with depression and anxiety symptoms.Patient arrived within time allowed and reports that she is feeling "OK." Patient rates her mood at a 6 on a scale of 1-10 with 10 being great. Pt reports sleep and appetite are "ok." Pt reports she had an OK afternoon spending time talking to friends and family. Pt reports helping others brings her  joy. Pt able to process. Pt engaged in discussion.       Session Time: 10:00 - 11:00   Participation Level: Active   Behavioral Response: CasualAlertDepressed   Type of Therapy: Group Therapy   Treatment Goals addressed: Coping   Interventions: CBT, DBT, Supportive and Reframing   Summary: Cln continued topic of distress tolerance. Group discussed "ACCEPTS" skill and how/when patients can employ this method to help.  Patients identified when this technique may be helpful in their personal lives. Patients participated in making Top 5 lists to help in heightened states of mind.     Therapist Response: Pt engaged in discussion and is able to identify when ACCEPTS could be helpful.  Patient created Top 5 lists including friends to call and movies to watch.           Session Time: 11:00- 12:00  Participation Level: Active   Behavioral Response: CasualAlertDepressed   Type of Therapy: Group therapy   Treatment Goals addressed: Coping   Interventions: CBT; Solution focused; Supportive; Reframing  Summary: Cln continued topic of distress tolerance. Group discussed "Self-Soothe" skill and how/when patients can employ this method to help.  Patients identified when this technique may be helpful in their personal lives.  Therapist Response: Pt engaged in discussion. Pt reports vision and touch self-soothe techniques would help her the most.    Session Time: 12:00 -1:00  Participation Level: Active   Behavioral Response: CasualAlertDepressed   Type of Therapy: Group Therapy, OT   Treatment Goals addressed: Coping   Interventions: Psychosocial skills training, Supportive   Summary: 12:00 - 12:50: Occupational Therapy group 12:50 -1:00 Clinician led check-out. Clinician assessed for immediate needs, medication compliance and  efficacy, and safety concerns   Therapist Response: 12:00 - 12:50: Patient engaged in group. See OT note.   12:50 - 1:00: At check-out, patient rates  his mood at a 8 on a scale of 1-10 with 10 being great. Pt reports weekend plans of going to church and having lunch with her family. Pt demonstrates some progress as evidenced by active participation in group on first day with PHP. Patient denies SI/HI at the end of group.    Suicidal/Homicidal: Nowithout intent/plan  Plan: Pt will continue in PHP while working to decrease depression and anxiety symptoms, increase emotional regulation, and increase ability to manage symptoms in a healthy manner.   Diagnosis: No primary diagnosis found.    1. Severe episode of recurrent major depressive disorder, without psychotic features [facility])       Royetta Crochet, [facility] 08/27/2021

## 2021-09-02 NOTE — Psych (Addendum)
Virtual Visit via Video Note  I connected with Michelle Clements on 08/31/21 at  9:00 AM EDT by a video enabled telemedicine application and verified that I am speaking with the correct person using two identifiers.  Location: Patient: Home Provider: Clinical Home Office   I discussed the limitations of evaluation and management by telemedicine and the availability of in person appointments. The patient expressed understanding and agreed to proceed.  Follow Up Instructions:    I discussed the assessment and treatment plan with the patient. The patient was provided an opportunity to ask questions and all were answered. The patient agreed with the plan and demonstrated an understanding of the instructions.   The patient was advised to call back or seek an in-person evaluation if the symptoms worsen or if the condition fails to improve as anticipated.  I provided 240 minutes of non-face-to-face time during this encounter.   Royetta Crochet, [facility]    [facility] PHP THERAPIST PROGRESS NOTE  Michelle Clements [id number]  Session Time: 9-10  Participation Level: Active  Behavioral Response: CasualAlertAnxious and Depressed  Type of Therapy: Group Therapy  Treatment Goals addressed: Coping  Interventions: CBT, DBT, Solution Focused, Strength-based, Supportive, and Reframing  Summary: Clinician led check-in regarding current stressors and situation. Clinician utilized active listening and empathetic response and validated patient emotions. Clinician facilitated processing group on pertinent issues.  Therapist Response: Michelle Clements is a 55 y.o. female who presents with depression and anxiety symptoms. Patient arrived within time allowed and reports that she is feeling "good." Patient rates her mood at an 8 on a scale of 1-10 with 10 being great. Pt states she went to the grocery store and took a nap yesterday. Pt reports she is concerned about her appointment with her pain doctor due to her  trying to overdose on the meds prescribed by that provider. Pt able to process. Pt engaged in discussion.            Session Time: 10:00 - 11:00   Participation Level: Active   Behavioral Response: CasualAlertDepressed   Type of Therapy: Group Therapy   Treatment Goals addressed: Coping   Interventions: CBT, DBT, Supportive and Reframing   Summary: Cln introduced topic of Positive Psychology. Group watched "Positive Psychology" Ted-Talk. Patients discussed how their "lens" of life effects the way they feel. Patients discussed how moving the "goal posts" of life push happiness over the "cognitive horizon." Group discussed 5 strategies to help change lens. Patients identified one strategy they would be willing to try to change their "lens" for at least 21 days to create a new habit.      Therapist Response: Pt engaged in discussion and is able to understand how moving the goal posts of life can make happiness and joy unattainable at times. Pt reports she will try meditation to help change her lens.         Session Time: 11:00 -12:00   Participation Level: Active   Behavioral Response: CasualAlertDepressed   Type of Therapy: Group therapy   Treatment Goals addressed: Coping   Interventions: Supportive   Summary: Deirdre Peer, led group on topic of strengths.   Therapist Response: Pt engaged in discussion. See [place] note.    Session Time: 12:00- 1:00   Participation Level: Active   Behavioral Response: CasualAlertDepressed   Type of Therapy: Group Therapy, OT   Treatment Goals addressed: Coping   Interventions: Psychosocial skills training, Supportive   Summary: 12:00-12:50: Occupational Therapy group  12:50 -1:00 Clinician led check-out. Clinician assessed for immediate needs, medication compliance and efficacy, and safety concerns  Therapist Response: 12:00-12:50 Patient engaged in group. See OT note.   12:50 - 1:00: At check-out, patient  rates her mood at a 9 on a scale of 1-10 with 10 being great. Pt reports afternoon plans of spending time with her in-laws and husband. Pt demonstrates some progress as evidenced by increased willingness to try coping skills. Patient denies SI/HI at the end of group.   Suicidal/Homicidal: Nowithout intent/plan  Plan: Pt will continue in PHP while working to decrease depression and anxiety symptoms, increase emotional regulation, and increase ability to manage symptoms in a healthy manner.   Diagnosis: Major depressive disorder, recurrent, severe without psychotic features ([place]) [F33.2]    1. Major depressive disorder, recurrent, severe without psychotic features ([place])   2. Bipolar 1 disorder ([place])   3. Severe episode of recurrent major depressive disorder, without psychotic features [facility])       Royetta Crochet, [facility] 08/31/2021

## 2021-09-02 NOTE — Psych (Signed)
Virtual Visit via Video Note  I connected with Michelle Clements on 08/30/21 at  9:00 AM EDT by a video enabled telemedicine application and verified that I am speaking with the correct person using two identifiers.  Location: Patient: Home Provider: Clinical Home Office   I discussed the limitations of evaluation and management by telemedicine and the availability of in person appointments. The patient expressed understanding and agreed to proceed.  Follow Up Instructions:    I discussed the assessment and treatment plan with the patient. The patient was provided an opportunity to ask questions and all were answered. The patient agreed with the plan and demonstrated an understanding of the instructions.   The patient was advised to call back or seek an in-person evaluation if the symptoms worsen or if the condition fails to improve as anticipated.  I provided 240 minutes of non-face-to-face time during this encounter.   Royetta Crochet, [facility]    [facility] [place] PHP THERAPIST PROGRESS NOTE  Michelle Clements [id number]  Session Time: 9-10  Participation Level: Active  Behavioral Response: CasualAlertAnxious and Depressed  Type of Therapy: Group Therapy  Treatment Goals addressed: Coping  Interventions: CBT, DBT, Solution Focused, Strength-based, Supportive, and Reframing  Summary: Clinician led check-in regarding current stressors and situation. Clinician utilized active listening and empathetic response and validated patient emotions. Clinician facilitated processing group on pertinent issues.  Therapist Response: Michelle Clements is a 55 y.o. female who presents with depression and anxiety symptoms. Patient arrived within time allowed and reports that she is feeling "anxious." Patient rates her mood at a 5 on a scale of 1-10 with 10 being great. Pt states she went on her first "public outing" this weekend and experienced heightened anxiety and was able to work through it. Pt reports having  increased feelings of anxiety and fear regarding group. Pt reports not sleeping well last night due to racing thoughts. Pt able to process. Pt engaged in discussion.            Session Time: 10:00 - 11:00   Participation Level: Active   Behavioral Response: CasualAlertDepressed   Type of Therapy: Group Therapy   Treatment Goals addressed: Coping   Interventions: CBT, DBT, Supportive and Reframing   Summary: Cln led discussion on the connection between fear and anxiety. Cln contextualized anxiety and fear as both being future oriented and fed by the unknown. Group members shared ways in which fear interacts with their anxiety and the problems it creates. Cln encouraged CBT thought challenging and reframing as ways to address problematic fears and anxieties.     Therapist Response: Pt engaged in discussion and is able to make connections between fear and anxiety.           Session Time: 11:00- 12:00   Participation Level: Active   Behavioral Response: CasualAlertDepressed   Type of Therapy: Group Therapy, OT   Treatment Goals addressed: Coping   Interventions: Psychosocial skills training, Supportive   Summary: Occupational Therapy group   Therapist Response: Patient engaged in group when prompted. See OT note.         Session Time: 12:00 -1:00   Participation Level: Active   Behavioral Response: CasualAlertDepressed   Type of Therapy: Group therapy   Treatment Goals addressed: Coping   Interventions: CBT; Solution focused; Supportive; Reframing   Summary: 12:00 - 12:50: Clinician led introduced the topic of "Radical Acceptance". Patients identified and discussed areas of life where radical acceptance would be useful.   12:50 -1:00  Clinician led check-out. Clinician assessed for immediate needs, medication compliance and efficacy, and safety concerns   Therapist Response: 12:00 - 12:50: Pt engaged in discussion when prompted. Pt identified radical acceptance  could be useful when thinking about her past, specifically related to her ex-husband. 12:50 - 1:00: At check-out, patient rates her mood at a 7 on a scale of 1-10 with 10 being great. Pt reports afternoon plans of going to the grocery store and taking a nap. Pt demonstrates some progress as evidenced by increased insight. Patient denies SI/HI at the end of group.   Suicidal/Homicidal: Nowithout intent/plan  Plan: Pt will continue in PHP while working to decrease depression and anxiety symptoms, increase emotional regulation, and increase ability to manage symptoms in a healthy manner.   Diagnosis: Severe episode of recurrent major depressive disorder, without psychotic features ([place]) [F33.2]    1. Severe episode of recurrent major depressive disorder, without psychotic features [facility])       Royetta Crochet, [facility] 08/30/2021

## 2021-09-02 NOTE — Therapy (Signed)
[place] [place], [place], [address] Phone: [phone]   Fax:  [phone] Virtual Visit via Video Note  I connected with Michelle Clements on 09/02/21 at  12:00 PM EDT by a video enabled telemedicine application and verified that I am speaking with the correct person using two identifiers.  Location: Patient: Patient Home Provider: Clinic Office   I discussed the limitations of evaluation and management by telemedicine and the availability of in person appointments. The patient expressed understanding and agreed to proceed.   I discussed the assessment and treatment plan with the patient. The patient was provided an opportunity to ask questions and all were answered. The patient agreed with the plan and demonstrated an understanding of the instructions.   The patient was advised to call back or seek an in-person evaluation if the symptoms worsen or if the condition fails to improve as anticipated.  I provided 50 minutes of non-face-to-face time during this encounter.   [name], OT/L   Occupational Therapy Treatment  Patient Details  Name: Michelle Clements MRN: [id number] Date of Birth: [birth year] Referring Provider (OT): Ricky Ala   Encounter Date: 09/02/2021   OT End of Session - 09/02/21 1538     Visit Number 4    Number of Visits 20    Date for OT Re-Evaluation 09/24/21    Authorization Type [employer]    OT Start Time 1200    OT Stop Time 1250    OT Time Calculation (min) 50 min    Activity Tolerance Patient tolerated treatment well    Behavior During Therapy WFL for tasks assessed/performed             Past Medical History:  Diagnosis Date   Allergic rhinitis    Anemia    Arthritis    Lower back and hips   Asthma    Bipolar disorder ([place])    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[place])    Constipation    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[place])    Depression     Essential hypertension, benign    Fibromyalgia    GERD (gastroesophageal reflux disease)    H/O sleep apnea    Hip pain, left    History of palpitations    Negative Holter monitor   History of pneumonia 1988   Hyperlipidemia    IBS (irritable bowel syndrome)    Neck pain    Poor short term memory    PTSD (post-traumatic stress disorder)    S/P Botox injection 11/2014    For migraine headaches   Type 2 diabetes mellitus ([place])    Urgency of urination     Past Surgical History:  Procedure Laterality Date   CARPAL TUNNEL RELEASE Right 06/10/2013   Procedure: CARPAL TUNNEL RELEASE;  Surgeon: Faythe Ghee, MD;  Location: [facility];  Service: Neurosurgery;  Laterality: Right;  Right Carpal Tunnel Release    CHOLECYSTECTOMY     DENTAL SURGERY  12/2014   mole exc     x 3   MUSCLE BIOPSY  2008   Right leg   TUBAL LIGATION      There were no vitals filed for this visit.   Subjective Assessment - 09/02/21 1537     Currently in Pain? No/denies  OT Education - 09/02/21 1537     Education Details Educated on different factors that contribute to our ability to manage our time, along with specific time management tips/strategies, including procrastination    Person(s) Educated Patient    Methods Explanation;Handout    Comprehension Verbalized understanding              OT Short Term Goals - 08/30/21 1309       OT SHORT TERM GOAL #1   Status On-going      OT SHORT TERM GOAL #2   Status On-going      OT SHORT TERM GOAL #3   Status On-going           Group Session:  S: "Making to do lists helps me a lot "  O: Group began with a warm-up ice breaker activity where patients were encouraged to reflect on the scenario "If you had 86,400 dollars dropped into your bank account at midnight and 24 hours to spend it, what you use the money for? The only rule was that any money not used disappeared after 24 hours."  Warm-up activity was used as an Designer, industrial/product and a way to connect that similar to the scenario of money, we do not get time back and there are 86,400 seconds in a day. Warm-up transitioned into today's group focused on topic of Time Management. Group members identified ways in which they currently struggle with managing their time and discussion focused on alternative strategies to recognize how we can be more productive and intentional with our time and managing it appropriately. Group members also discussed specific strategies to overcome procrastination.   A: Jamilex was active and independent in her participation of discussion and activity, sharing that she struggles to manage her time, noting she would "force" herself to do something for 8 hours of the day when she was 'supposed' to be at work. She shared that she has since learned to give herself the ability to nap when she needs to and make to do lists. Appeared open and receptive to strategies reviewed during session.   P: Continue to attend PHP OT group sessions 5x week for 2 weeks to promote daily structure, social engagement, and opportunities to develop and utilize adaptive strategies to maximize functional performance in preparation for safe transition and integration back into school, work, and the community. Plan to address topic of time management in next OT group session.  Plan - 09/02/21 1538     Occupational performance deficits (Please refer to evaluation for details): ADL's;IADL's;Rest and Sleep;Education;Work;Leisure;Social Participation    Body Structure / Function / Physical Skills ADL    Cognitive Skills Attention;Emotional;Energy/Drive;Learn;Memory;Perception;Problem Solve;Safety Awareness;Temperament/Personality;Thought;Understand    Psychosocial Skills Coping Strategies;Environmental  Adaptations;Habits;Interpersonal Interaction;Routines and Behaviors             Patient will benefit from skilled therapeutic intervention in  order to improve the following deficits and impairments:   Body Structure / Function / Physical Skills: ADL Cognitive Skills: Attention, Emotional, Energy/Drive, Learn, Memory, Perception, Problem Solve, Safety Awareness, Temperament/Personality, Thought, Understand Psychosocial Skills: Coping Strategies, Environmental  Adaptations, Habits, Interpersonal Interaction, Routines and Behaviors   Visit Diagnosis: Difficulty coping  Frontal lobe and executive function deficit  Bipolar 1 disorder [facility])    Problem List Patient Active Problem List   Diagnosis Date Noted   Major depressive disorder, recurrent episode, severe ([place]) 08/27/2021   Migraine 10/28/2014   Neck pain of over 3 months duration 10/28/2014   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[place])  07/29/2014   Headache(784.0) 11/06/2013   Cervicalgia 11/06/2013   Acute confusional state 11/06/2013   History of stroke in prior 3 months 10/15/2013   Neuropathy of upper extremity 04/23/2013   Unspecified vitamin D deficiency 02/13/2009   Morbid obesity ([place]) 02/13/2009   BIPOLAR AFFECTIVE DISORDER 11/19/2007   Essential hypertension, benign 11/16/2007   Mixed hyperlipidemia 10/03/2007   Hx of tobacco use, presenting hazards to health 10/03/2007   DEPRESSION 10/03/2007   IBS 10/03/2007   FIBROMYALGIA 10/03/2007   Palpitations 10/03/2007    09/02/2021  [name], MOT, OTR/L  09/02/2021, 3:38 PM  [place], [place], [address] Phone: [phone]   Fax:  [phone]  Name: KELLYN MCCARY MRN: [id number] Date of Birth: 1966/08/11
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
## 2021-09-03 ENCOUNTER — Ambulatory Visit (HOSPITAL_COMMUNITY): Payer: HMO

## 2021-09-03 ENCOUNTER — Other Ambulatory Visit: Payer: Self-pay

## 2021-09-03 ENCOUNTER — Other Ambulatory Visit (HOSPITAL_COMMUNITY): Payer: HMO | Admitting: Professional

## 2021-09-03 ENCOUNTER — Other Ambulatory Visit (HOSPITAL_COMMUNITY): Payer: HMO | Admitting: Occupational Therapy

## 2021-09-03 ENCOUNTER — Encounter (HOSPITAL_COMMUNITY): Payer: Self-pay

## 2021-09-03 DIAGNOSIS — R41844 Frontal lobe and executive function deficit: Secondary | ICD-10-CM

## 2021-09-03 DIAGNOSIS — R4589 Other symptoms and signs involving emotional state: Secondary | ICD-10-CM

## 2021-09-03 DIAGNOSIS — F319 Bipolar disorder, unspecified: Secondary | ICD-10-CM

## 2021-09-03 DIAGNOSIS — K219 Gastro-esophageal reflux disease without esophagitis: Secondary | ICD-10-CM | POA: Diagnosis not present

## 2021-09-03 DIAGNOSIS — E1165 Type 2 diabetes mellitus with hyperglycemia: Secondary | ICD-10-CM | POA: Diagnosis not present

## 2021-09-03 DIAGNOSIS — F332 Major depressive disorder, recurrent severe without psychotic features: Secondary | ICD-10-CM
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
## 2021-09-03 NOTE — Psych (Signed)
Virtual Visit via Video Note  I connected with Michelle Clements on 09/02/21 at  9:00 AM EDT by a video enabled telemedicine application and verified that I am speaking with the correct person using two identifiers.  Location: Patient: Home Provider: Clinical Home Office   I discussed the limitations of evaluation and management by telemedicine and the availability of in person appointments. The patient expressed understanding and agreed to proceed.  Follow Up Instructions:    I discussed the assessment and treatment plan with the patient. The patient was provided an opportunity to ask questions and all were answered. The patient agreed with the plan and demonstrated an understanding of the instructions.   The patient was advised to call back or seek an in-person evaluation if the symptoms worsen or if the condition fails to improve as anticipated.  I provided 240 minutes of non-face-to-face time during this encounter.   Royetta Crochet, [facility]    [facility] PHP THERAPIST PROGRESS NOTE  Michelle Clements [id number]  Session Time: 9-10  Participation Level: Active  Behavioral Response: CasualAlertAnxious and Depressed  Type of Therapy: Group Therapy  Treatment Goals addressed: Coping  Interventions: CBT, DBT, Solution Focused, Strength-based, Supportive, and Reframing  Summary: Clinician led check-in regarding current stressors and situation. Clinician utilized active listening and empathetic response and validated patient emotions. Clinician facilitated processing group on pertinent issues.  Therapist Response: Michelle Clements is a 55 y.o. female who presents with depression and anxiety symptoms. Patient arrived within time allowed and reports that she is feeling "good." Patient rates her mood at an 8.5 on a scale of 1-10 with 10 being great. Pt reports she had good appointments for pain with her doctor yesterday. Pt reports "she was compassionate which helped my anxiety levels." Pt able to  process. Pt engaged in discussion.        Session Time: 10:00 - 11:00   Participation Level: Active   Behavioral Response: CasualAlertDepressed   Type of Therapy: Group Therapy   Treatment Goals addressed: Coping   Interventions: CBT, DBT, Supportive and Reframing   Summary: Clinician led discussion codependence and the "warning signs" of codependency.     Therapist Response: Pt engaged in discussion and reports understanding codependency. Pt reports she needs to work on codependence in her own life.       Session Time: 11:00 -12:00   Participation Level: Active   Behavioral Response: CasualAlertDepressed   Type of Therapy: Group therapy   Treatment Goals addressed: Coping   Interventions: Supportive   Summary: Cln introduced the cognitive triangle. Group spent time discussing how our thoughts, behaviors, and emotions about situations can affect how we handle problems and situations.   Therapist Response: Pt engaged in discussion. Pt reports understanding cognitive triangle. Pt able to identify how behaviors, thoughts, and emotions have affected how a situation was handled.    Session Time: 12:00- 1:00   Participation Level: Active   Behavioral Response: CasualAlertDepressed   Type of Therapy: Group Therapy, OT   Treatment Goals addressed: Coping   Interventions: Psychosocial skills training, Supportive   Summary: 12:00-12:50: Occupational Therapy group 12:50 -1:00 Clinician led check-out. Clinician assessed for immediate needs, medication compliance and efficacy, and safety concerns  Therapist Response: 12:00-12:50 Patient engaged in group. See OT note.   12:50 - 1:00: At check-out, patient rates her mood at a 7 on a scale of 1-10 with 10 being great. Pt reports afternoon plans of having down time for self. Pt demonstrates some progress  as evidenced by not allowing self to "spiral" about things she cannot control. Patient denies SI/HI at the end of  group.   Suicidal/Homicidal: Nowithout intent/plan  Plan: Pt will continue in PHP while working to decrease depression and anxiety symptoms, increase emotional regulation, and increase ability to manage symptoms in a healthy manner.   Diagnosis: Severe episode of recurrent major depressive disorder, without psychotic features ([place]) [F33.2]    1. Severe episode of recurrent major depressive disorder, without psychotic features [facility])       Royetta Crochet, [facility] 09/02/2021

## 2021-09-03 NOTE — Therapy (Signed)
[place], [place], [address] Phone: [phone]   Fax:  [phone] Virtual Visit via Video Note  I connected with Michelle Clements on 09/03/21 at  12:00 PM EDT by a video enabled telemedicine application and verified that I am speaking with the correct person using two identifiers.  Location: Patient: Patient Home Provider: Home Office   I discussed the limitations of evaluation and management by telemedicine and the availability of in person appointments. The patient expressed understanding and agreed to proceed.    I discussed the assessment and treatment plan with the patient. The patient was provided an opportunity to ask questions and all were answered. The patient agreed with the plan and demonstrated an understanding of the instructions.   The patient was advised to call back or seek an in-person evaluation if the symptoms worsen or if the condition fails to improve as anticipated.  I provided 50 minutes of non-face-to-face time during this encounter.   [name], OT/L   Occupational Therapy Treatment  Patient Details  Name: Michelle Clements MRN: [id number] Date of Birth: 11/04/1966 Referring Provider (OT): Ricky Ala   Encounter Date: 09/03/2021   OT End of Session - 09/03/21 1312     Visit Number 5    Number of Visits 20    Date for OT Re-Evaluation 09/24/21    Authorization Type [employer]    OT Start Time 1200    OT Stop Time 1250    OT Time Calculation (min) 50 min    Activity Tolerance Patient tolerated treatment well    Behavior During Therapy WFL for tasks assessed/performed             Past Medical History:  Diagnosis Date   Allergic rhinitis    Anemia    Arthritis    Lower back and hips   Asthma    Bipolar disorder ([place])    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[place])    Constipation    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[place])    Depression     Essential hypertension, benign    Fibromyalgia    GERD (gastroesophageal reflux disease)    H/O sleep apnea    Hip pain, left    History of palpitations    Negative Holter monitor   History of pneumonia 1988   Hyperlipidemia    IBS (irritable bowel syndrome)    Neck pain    Poor short term memory    PTSD (post-traumatic stress disorder)    S/P Botox injection 11/2014    For migraine headaches   Type 2 diabetes mellitus ([place])    Urgency of urination     Past Surgical History:  Procedure Laterality Date   CARPAL TUNNEL RELEASE Right 06/10/2013   Procedure: CARPAL TUNNEL RELEASE;  Surgeon: Faythe Ghee, MD;  Location: [facility];  Service: Neurosurgery;  Laterality: Right;  Right Carpal Tunnel Release    CHOLECYSTECTOMY     DENTAL SURGERY  12/2014   mole exc     x 3   MUSCLE BIOPSY  2008   Right leg   TUBAL LIGATION      There were no vitals filed for this visit.   Subjective Assessment - 09/03/21 1312     Currently in Pain? No/denies             OT Education - 09/03/21 1312     Education Details Continued education on different factors that contribute to  our ability to manage our time, along with specific time management tips/strategies, including procrastination    Person(s) Educated Patient    Methods Explanation;Handout    Comprehension Verbalized understanding              OT Short Term Goals - 08/30/21 1309       OT SHORT TERM GOAL #1   Status On-going      OT SHORT TERM GOAL #2   Status On-going      OT SHORT TERM GOAL #3   Status On-going           Group Session:  S: "Every once in awhile my daughters and I clean out our closets and have a 'swap' where we share our clothes with each other before donating them."  O: Group began with a recap on strategies/tips learned from previous OT session focused on time management. Today's group continued to focus on topic of time management and reviewed additional strategies to manage and create  schedules that are more efficient and effective in meeting their needs.   A: Michelle Clements was active and independent in her participation of discussion and activity, sharing that one way she helps to keep herself organized is tidy up her environment on a daily basis, and long term, engages in clothing 'swaps' with her daughters to get rid of clothes they no longer wear. Pt also identified improvement in learning to delegate tasks to others, recently delegating letting the dogs out and taking the trash to her son-in-law who recently moved in. Appeared receptive to additional education received on time management and procrastination.   P: Continue to attend PHP OT group sessions 5x week for 1 weeks to promote daily structure, social engagement, and opportunities to develop and utilize adaptive strategies to maximize functional performance in preparation for safe transition and integration back into school, work, and the community.   Plan - 09/03/21 1312     Occupational performance deficits (Please refer to evaluation for details): ADL's;IADL's;Rest and Sleep;Education;Work;Leisure;Social Participation    Body Structure / Function / Physical Skills ADL    Cognitive Skills Attention;Emotional;Energy/Drive;Learn;Memory;Perception;Problem Solve;Safety Awareness;Temperament/Personality;Thought;Understand    Psychosocial Skills Coping Strategies;Environmental  Adaptations;Habits;Interpersonal Interaction;Routines and Behaviors             Patient will benefit from skilled therapeutic intervention in order to improve the following deficits and impairments:   Body Structure / Function / Physical Skills: ADL Cognitive Skills: Attention, Emotional, Energy/Drive, Learn, Memory, Perception, Problem Solve, Safety Awareness, Temperament/Personality, Thought, Understand Psychosocial Skills: Coping Strategies, Environmental  Adaptations, Habits, Interpersonal Interaction, Routines and Behaviors   Visit  Diagnosis: Difficulty coping  Frontal lobe and executive function deficit  Bipolar 1 disorder [facility])    Problem List Patient Active Problem List   Diagnosis Date Noted   Major depressive disorder, recurrent episode, severe ([place]) 08/27/2021   Migraine 10/28/2014   Neck pain of over 3 months duration 10/28/2014   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[place]) 07/29/2014   Headache(784.0) 11/06/2013   Cervicalgia 11/06/2013   Acute confusional state 11/06/2013   History of stroke in prior 3 months 10/15/2013   Neuropathy of upper extremity 04/23/2013   Unspecified vitamin D deficiency 02/13/2009   Morbid obesity ([place]) 02/13/2009   BIPOLAR AFFECTIVE DISORDER 11/19/2007   Essential hypertension, benign 11/16/2007   Mixed hyperlipidemia 10/03/2007   Hx of tobacco use, presenting hazards to health 10/03/2007   DEPRESSION 10/03/2007   IBS 10/03/2007   FIBROMYALGIA 10/03/2007   Palpitations 10/03/2007    09/03/2021  Primitivo Gauze  Michelle Clements, MOT, OTR/L  09/03/2021, 1:13 PM  [place], [place], [address] Phone: [phone]   Fax:  [phone]  Name: Michelle Clements MRN: [id number] Date of Birth: August 26, 1966

## 2021-09-06 ENCOUNTER — Other Ambulatory Visit: Payer: Self-pay

## 2021-09-06 ENCOUNTER — Other Ambulatory Visit (HOSPITAL_COMMUNITY): Payer: HMO | Attending: Psychiatry | Admitting: Licensed Clinical Social Worker

## 2021-09-06 ENCOUNTER — Other Ambulatory Visit (HOSPITAL_COMMUNITY): Payer: Self-pay | Admitting: Psychiatry

## 2021-09-06 ENCOUNTER — Ambulatory Visit (HOSPITAL_COMMUNITY): Payer: HMO

## 2021-09-06 ENCOUNTER — Encounter (HOSPITAL_COMMUNITY): Payer: Self-pay

## 2021-09-06 ENCOUNTER — Other Ambulatory Visit (HOSPITAL_COMMUNITY): Payer: HMO | Admitting: Occupational Therapy

## 2021-09-06 DIAGNOSIS — R41844 Frontal lobe and executive function deficit: Secondary | ICD-10-CM | POA: Diagnosis not present

## 2021-09-06 DIAGNOSIS — F332 Major depressive disorder, recurrent severe without psychotic features: Secondary | ICD-10-CM | POA: Diagnosis not present

## 2021-09-06 DIAGNOSIS — Z9151 Personal history of suicidal behavior: Secondary | ICD-10-CM | POA: Insufficient documentation

## 2021-09-06 DIAGNOSIS — Z87891 Personal history of nicotine dependence: Secondary | ICD-10-CM | POA: Diagnosis not present

## 2021-09-06 DIAGNOSIS — Z560 Unemployment, unspecified: Secondary | ICD-10-CM | POA: Insufficient documentation

## 2021-09-06 DIAGNOSIS — R4589 Other symptoms and signs involving emotional state: Secondary | ICD-10-CM | POA: Diagnosis not present

## 2021-09-06 DIAGNOSIS — Z79899 Other long term (current) drug therapy: Secondary | ICD-10-CM | POA: Insufficient documentation

## 2021-09-06 DIAGNOSIS — F419 Anxiety disorder, unspecified: Secondary | ICD-10-CM | POA: Insufficient documentation

## 2021-09-06 DIAGNOSIS — Z818 Family history of other mental and behavioral disorders: Secondary | ICD-10-CM | POA: Insufficient documentation

## 2021-09-06 DIAGNOSIS — F319 Bipolar disorder, unspecified: Secondary | ICD-10-CM

## 2021-09-06 NOTE — Therapy (Signed)
[place], [place], [address] Phone: [phone]   Fax:  [phone] Virtual Visit via Video Note  I connected with Michelle Clements on 09/06/21 at  11:00 AM EDT by a video enabled telemedicine application and verified that I am speaking with the correct person using two identifiers.  Location: Patient: Patient Home Provider: Clinic Office   I discussed the limitations of evaluation and management by telemedicine and the availability of in person appointments. The patient expressed understanding and agreed to proceed.   I discussed the assessment and treatment plan with the patient. The patient was provided an opportunity to ask questions and all were answered. The patient agreed with the plan and demonstrated an understanding of the instructions.   The patient was advised to call back or seek an in-person evaluation if the symptoms worsen or if the condition fails to improve as anticipated.  I provided 60 minutes of non-face-to-face time during this encounter.   [name], OT/L   Occupational Therapy Treatment  Patient Details  Name: Michelle Clements MRN: [id number] Date of Birth: May 31, 1966 Referring Provider (OT): Ricky Ala   Encounter Date: 09/06/2021   OT End of Session - 09/06/21 1308     Visit Number 6    Number of Visits 20    Date for OT Re-Evaluation 09/24/21    Authorization Type [employer]    OT Start Time 1105    OT Stop Time 1205    OT Time Calculation (min) 60 min    Activity Tolerance Patient tolerated treatment well    Behavior During Therapy WFL for tasks assessed/performed             Past Medical History:  Diagnosis Date   Allergic rhinitis    Anemia    Arthritis    Lower back and hips   Asthma    Bipolar disorder ([place])    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[place])    Constipation    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[place])    Depression     Essential hypertension, benign    Fibromyalgia    GERD (gastroesophageal reflux disease)    H/O sleep apnea    Hip pain, left    History of palpitations    Negative Holter monitor   History of pneumonia 1988   Hyperlipidemia    IBS (irritable bowel syndrome)    Neck pain    Poor short term memory    PTSD (post-traumatic stress disorder)    S/P Botox injection 11/2014    For migraine headaches   Type 2 diabetes mellitus ([place])    Urgency of urination     Past Surgical History:  Procedure Laterality Date   CARPAL TUNNEL RELEASE Right 06/10/2013   Procedure: CARPAL TUNNEL RELEASE;  Surgeon: Faythe Ghee, MD;  Location: [facility];  Service: Neurosurgery;  Laterality: Right;  Right Carpal Tunnel Release    CHOLECYSTECTOMY     DENTAL SURGERY  12/2014   mole exc     x 3   MUSCLE BIOPSY  2008   Right leg   TUBAL LIGATION      There were no vitals filed for this visit.   Subjective Assessment - 09/06/21 1307     Currently in Pain? No/denies             OT Education - 09/06/21 1308     Education Details Educated on different communication styles and identified strategies/tips to  practice being more assertive    Person(s) Educated Patient    Methods Explanation;Handout    Comprehension Verbalized understanding              OT Short Term Goals - 08/30/21 1309       OT SHORT TERM GOAL #1   Status On-going      OT SHORT TERM GOAL #2   Status On-going      OT SHORT TERM GOAL #3   Status On-going           Group Session:  S: "I avoid conflict and shut down when someone else gets upset or angry."  O: Today's group focused on topic of Communication Styles. Group members were educated on the different styles including passive, aggressive, and assertive communication. Members shared and reflected on which style they most often find themselves communicating in and how to transition to a more assertive approach.   A: Michelle Clements was active and independent in her  participation of discussion and activity, sharing that she avoids conflict and does not like when someone else is angry or upset at/or towards her. She stated that she will take a passive approach and try to please them or shut down and walk away. She noted she struggles to stand up for herself and is eager to learn strategies to be more assertive. Appeared attentive and receptive to information received on communication styles.   P: Continue to attend PHP OT group sessions 5x week for 1 weeks to promote daily structure, social engagement, and opportunities to develop and utilize adaptive strategies to maximize functional performance in preparation for safe transition and integration back into school, work, and the community. Plan to address topic of assertiveness in next OT group session.   Plan - 09/06/21 1308     Occupational performance deficits (Please refer to evaluation for details): ADL's;IADL's;Rest and Sleep;Education;Work;Leisure;Social Participation    Body Structure / Function / Physical Skills ADL    Cognitive Skills Attention;Emotional;Energy/Drive;Learn;Memory;Perception;Problem Solve;Safety Awareness;Temperament/Personality;Thought;Understand    Psychosocial Skills Coping Strategies;Environmental  Adaptations;Habits;Interpersonal Interaction;Routines and Behaviors             Patient will benefit from skilled therapeutic intervention in order to improve the following deficits and impairments:   Body Structure / Function / Physical Skills: ADL Cognitive Skills: Attention, Emotional, Energy/Drive, Learn, Memory, Perception, Problem Solve, Safety Awareness, Temperament/Personality, Thought, Understand Psychosocial Skills: Coping Strategies, Environmental  Adaptations, Habits, Interpersonal Interaction, Routines and Behaviors   Visit Diagnosis: Difficulty coping  Frontal lobe and executive function deficit  Bipolar 1 disorder [facility])    Problem List Patient Active Problem  List   Diagnosis Date Noted   Major depressive disorder, recurrent episode, severe ([place]) 08/27/2021   Migraine 10/28/2014   Neck pain of over 3 months duration 10/28/2014   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[place]) 07/29/2014   Headache(784.0) 11/06/2013   Cervicalgia 11/06/2013   Acute confusional state 11/06/2013   History of stroke in prior 3 months 10/15/2013   Neuropathy of upper extremity 04/23/2013   Unspecified vitamin D deficiency 02/13/2009   Morbid obesity ([place]) 02/13/2009   BIPOLAR AFFECTIVE DISORDER 11/19/2007   Essential hypertension, benign 11/16/2007   Mixed hyperlipidemia 10/03/2007   Hx of tobacco use, presenting hazards to health 10/03/2007   DEPRESSION 10/03/2007   IBS 10/03/2007   FIBROMYALGIA 10/03/2007   Palpitations 10/03/2007    09/06/2021  [name], MOT, OTR/L  09/06/2021, 1:09 PM  [place],  [place], [address] Phone: [phone]   Fax:  [phone]  Name: Michelle Clements MRN: [id number] Date of Birth: 21-Oct-1966

## 2021-09-07 ENCOUNTER — Ambulatory Visit (HOSPITAL_COMMUNITY): Payer: HMO

## 2021-09-07 ENCOUNTER — Other Ambulatory Visit (HOSPITAL_COMMUNITY): Payer: HMO | Admitting: Occupational Therapy

## 2021-09-07 ENCOUNTER — Other Ambulatory Visit: Payer: Self-pay

## 2021-09-07 ENCOUNTER — Other Ambulatory Visit (HOSPITAL_COMMUNITY): Payer: HMO | Admitting: Licensed Clinical Social Worker

## 2021-09-07 DIAGNOSIS — M5412 Radiculopathy, cervical region: Secondary | ICD-10-CM | POA: Diagnosis not present

## 2021-09-07 DIAGNOSIS — R4589 Other symptoms and signs involving emotional state: Secondary | ICD-10-CM

## 2021-09-07 DIAGNOSIS — F319 Bipolar disorder, unspecified: Secondary | ICD-10-CM

## 2021-09-07 DIAGNOSIS — F332 Major depressive disorder, recurrent severe without psychotic features: Secondary | ICD-10-CM | POA: Diagnosis not present

## 2021-09-07 DIAGNOSIS — R41844 Frontal lobe and executive function deficit: Secondary | ICD-10-CM

## 2021-09-07 NOTE — Therapy (Signed)
[place], [place], [address] Phone: [phone]   Fax:  [phone] Virtual Visit via Video Note  I connected with Michelle Clements on 09/07/21 at  11:00 AM EDT by a video enabled telemedicine application and verified that I am speaking with the correct person using two identifiers.  Location: Patient: Patient Home Provider: Clinic Office   I discussed the limitations of evaluation and management by telemedicine and the availability of in person appointments. The patient expressed understanding and agreed to proceed.   I discussed the assessment and treatment plan with the patient. The patient was provided an opportunity to ask questions and all were answered. The patient agreed with the plan and demonstrated an understanding of the instructions.   The patient was advised to call back or seek an in-person evaluation if the symptoms worsen or if the condition fails to improve as anticipated.  I provided 60 minutes of non-face-to-face time during this encounter.   [name], OT/L   Occupational Therapy Treatment  Patient Details  Name: Michelle Clements MRN: [id number] Date of Birth: June 21, 1966 Referring Provider (OT): Ricky Ala   Encounter Date: 09/07/2021   OT End of Session - 09/07/21 1219     Visit Number 7    Number of Visits 20    Date for OT Re-Evaluation 09/24/21    Authorization Type [employer]    OT Start Time 1105    OT Stop Time 1205    OT Time Calculation (min) 60 min    Activity Tolerance Patient tolerated treatment well    Behavior During Therapy WFL for tasks assessed/performed             Past Medical History:  Diagnosis Date   Allergic rhinitis    Anemia    Arthritis    Lower back and hips   Asthma    Bipolar disorder ([place])    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[place])    Constipation    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[place])    Depression     Essential hypertension, benign    Fibromyalgia    GERD (gastroesophageal reflux disease)    H/O sleep apnea    Hip pain, left    History of palpitations    Negative Holter monitor   History of pneumonia 1988   Hyperlipidemia    IBS (irritable bowel syndrome)    Neck pain    Poor short term memory    PTSD (post-traumatic stress disorder)    S/P Botox injection 11/2014    For migraine headaches   Type 2 diabetes mellitus ([place])    Urgency of urination     Past Surgical History:  Procedure Laterality Date   CARPAL TUNNEL RELEASE Right 06/10/2013   Procedure: CARPAL TUNNEL RELEASE;  Surgeon: Faythe Ghee, MD;  Location: [facility];  Service: Neurosurgery;  Laterality: Right;  Right Carpal Tunnel Release    CHOLECYSTECTOMY     DENTAL SURGERY  12/2014   mole exc     x 3   MUSCLE BIOPSY  2008   Right leg   TUBAL LIGATION      There were no vitals filed for this visit.   Subjective Assessment - 09/07/21 1219     Currently in Pain? No/denies  OT Education - 09/07/21 1219     Education Details Educated on different communication styles with strategies to become more assertive with use of XYZ communication tool    Person(s) Educated Patient    Methods Explanation;Handout    Comprehension Verbalized understanding              OT Short Term Goals - 08/30/21 1309       OT SHORT TERM GOAL #1   Status On-going      OT SHORT TERM GOAL #2   Status On-going      OT SHORT TERM GOAL #3   Status On-going           Group Session:  S: "I have a situation with my daughter and one with my husband that I need to bring up and talk about."  O: Group began with a reflection from previous OT session focused on communication styles and group members re-iterated what was learned during previous session. Members shared and reflected on any opportunities they were presented with last evening to practice their  assertiveness skills or recognize patterns of communication observed. Today's group focused on assertiveness skills training and use of the XYZ* assertive communication tool was introduced. The XYZ communication tool states: I feel X when you do Y in situation Z and I would like _________. X is the emotion, Y is the specific behavior, and Z is the specific situation. Group members each formulated their own XYZ statement and shared with the group to discuss and offer feedback. Additional tips and strategies to practice being assertive were also introduced and discussed.   A: Michelle Clements was active and independent in her participation of discussion, actively asking questions and offering responses/input to others. Pt shared two scenarios in which she would like to be assertive moving forward and asked to brainstorm ideas with the group. One scenario pt shared was having a difficult conversation with her daughter who is currently not speaking to her. The other scenario was identifying how to tell her husband that him being on his phone and ignoring her makes her feel alone/unsupported. Pt receptive to education, responses, and feedback from both clinician and peers. Pt expressed appropriate use of XYZ formula in formulating her response to both scenarios.   P: Continue to attend PHP OT group sessions 5x week for 1 weeks to promote daily structure, social engagement, and opportunities to develop and utilize adaptive strategies to maximize functional performance in preparation for safe transition and integration back into school, work, and the community.    Plan - 09/07/21 1219     Occupational performance deficits (Please refer to evaluation for details): ADL's;IADL's;Rest and Sleep;Education;Work;Leisure;Social Participation    Body Structure / Function / Physical Skills ADL    Cognitive Skills Attention;Emotional;Energy/Drive;Learn;Memory;Perception;Problem Solve;Safety  Awareness;Temperament/Personality;Thought;Understand    Psychosocial Skills Coping Strategies;Environmental  Adaptations;Habits;Interpersonal Interaction;Routines and Behaviors             Patient will benefit from skilled therapeutic intervention in order to improve the following deficits and impairments:   Body Structure / Function / Physical Skills: ADL Cognitive Skills: Attention, Emotional, Energy/Drive, Learn, Memory, Perception, Problem Solve, Safety Awareness, Temperament/Personality, Thought, Understand Psychosocial Skills: Coping Strategies, Environmental  Adaptations, Habits, Interpersonal Interaction, Routines and Behaviors   Visit Diagnosis: Difficulty coping  Frontal lobe and executive function deficit  Bipolar 1 disorder [facility])    Problem List Patient Active Problem List   Diagnosis Date Noted   Major depressive disorder, recurrent episode, severe ([place]) 08/27/2021  Migraine 10/28/2014   Neck pain of over 3 months duration 10/28/2014   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) 07/29/2014   Headache(784.0) 11/06/2013   Cervicalgia 11/06/2013   Acute confusional state 11/06/2013   History of stroke in prior 3 months 10/15/2013   Neuropathy of upper extremity 04/23/2013   Unspecified vitamin D deficiency 02/13/2009   Morbid obesity ([place]) 02/13/2009   BIPOLAR AFFECTIVE DISORDER 11/19/2007   Essential hypertension, benign 11/16/2007   Mixed hyperlipidemia 10/03/2007   Hx of tobacco use, presenting hazards to health 10/03/2007   DEPRESSION 10/03/2007   IBS 10/03/2007   FIBROMYALGIA 10/03/2007   Palpitations 10/03/2007    09/07/2021  [name], MOT, OTR/L  09/07/2021, 12:20 PM  [place], [place], [address] Phone: [phone]   Fax:  [phone]  Name: Michelle Clements MRN: [id number] Date of Birth: March 12, 1966

## 2021-09-07 NOTE — Progress Notes (Signed)
Spoke with patient via Webex video call, used 2 identifiers to correctly identify patient. States that groups are going really good and she is enjoying them. Trazodone was started and she is getting better sleep. Has anxiety today because she is going for an injection in her neck for bone spurs, pinched nerve, and curvature to help alleviate the pain. On scale 1-10 as 10 being worst she rates depression at 2 and anxiety at 3. Denies SI/HI or AV hallucinations. No issues or complaints. No side effects from medications.

## 2021-09-08 ENCOUNTER — Encounter (HOSPITAL_COMMUNITY): Payer: Self-pay

## 2021-09-08 ENCOUNTER — Other Ambulatory Visit (HOSPITAL_COMMUNITY): Payer: HMO | Admitting: Occupational Therapy

## 2021-09-08 ENCOUNTER — Other Ambulatory Visit: Payer: Self-pay

## 2021-09-08 ENCOUNTER — Other Ambulatory Visit (HOSPITAL_COMMUNITY): Payer: HMO | Admitting: Licensed Clinical Social Worker

## 2021-09-08 ENCOUNTER — Ambulatory Visit (HOSPITAL_COMMUNITY): Payer: HMO

## 2021-09-08 DIAGNOSIS — F332 Major depressive disorder, recurrent severe without psychotic features: Secondary | ICD-10-CM

## 2021-09-08 DIAGNOSIS — F319 Bipolar disorder, unspecified: Secondary | ICD-10-CM

## 2021-09-08 DIAGNOSIS — R41844 Frontal lobe and executive function deficit: Secondary | ICD-10-CM

## 2021-09-08 DIAGNOSIS — R4589 Other symptoms and signs involving emotional state: Secondary | ICD-10-CM

## 2021-09-08 NOTE — Therapy (Signed)
[place], [place], [address] Phone: [phone]   Fax:  [phone] Virtual Visit via Video Note  I connected with [name] on 09/08/21 at  12:10 PM EDT by a video enabled telemedicine application and verified that I am speaking with the correct person using two identifiers.  Location: Patient: Patient Home Provider: Clinic Office   I discussed the limitations of evaluation and management by telemedicine and the availability of in person appointments. The patient expressed understanding and agreed to proceed.    I discussed the assessment and treatment plan with the patient. The patient was provided an opportunity to ask questions and all were answered. The patient agreed with the plan and demonstrated an understanding of the instructions.   The patient was advised to call back or seek an in-person evaluation if the symptoms worsen or if the condition fails to improve as anticipated.  I provided 40 minutes of non-face-to-face time during this encounter.   [name], OT/L   Occupational Therapy Treatment  Patient Details  Name: ENZLEY KITCHENS MRN: [id number] Date of Birth: 12-Aug-1966 Referring Provider (OT): Ricky Ala   Encounter Date: 09/08/2021   OT End of Session - 09/08/21 1525     Visit Number 8    Number of Visits 20    Date for OT Re-Evaluation 09/24/21    Authorization Type [employer]    OT Start Time 1210    OT Stop Time 1250    OT Time Calculation (min) 40 min    Activity Tolerance Patient tolerated treatment well    Behavior During Therapy WFL for tasks assessed/performed             Past Medical History:  Diagnosis Date   Allergic rhinitis    Anemia    Arthritis    Lower back and hips   Asthma    Bipolar disorder ([place])    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[place])    Constipation    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[place])    Depression     Essential hypertension, benign    Fibromyalgia    GERD (gastroesophageal reflux disease)    H/O sleep apnea    Hip pain, left    History of palpitations    Negative Holter monitor   History of pneumonia 1988   Hyperlipidemia    IBS (irritable bowel syndrome)    Neck pain    Poor short term memory    PTSD (post-traumatic stress disorder)    S/P Botox injection 11/2014    For migraine headaches   Type 2 diabetes mellitus ([place])    Urgency of urination     Past Surgical History:  Procedure Laterality Date   CARPAL TUNNEL RELEASE Right 06/10/2013   Procedure: CARPAL TUNNEL RELEASE;  Surgeon: Faythe Ghee, MD;  Location: [facility];  Service: Neurosurgery;  Laterality: Right;  Right Carpal Tunnel Release    CHOLECYSTECTOMY     DENTAL SURGERY  12/2014   mole exc     x 3   MUSCLE BIOPSY  2008   Right leg   TUBAL LIGATION      There were no vitals filed for this visit.   Subjective Assessment - 09/08/21 1524     Currently in Pain? No/denies  OT Education - 09/08/21 1524     Education Details Educated on the 5 F's and provided resources/strategies/tips to improve overall health and wellness    Person(s) Educated Patient    Methods Explanation;Handout    Comprehension Verbalized understanding              OT Short Term Goals - 08/30/21 1309       OT SHORT TERM GOAL #1   Status On-going      OT SHORT TERM GOAL #2   Status On-going      OT SHORT TERM GOAL #3   Status On-going           Group Session:  S: "I like to bring my dogs out on walks"  O: Today's group session focused on the topic of health and wellness as it relates to the impact on mental health. Discussion focused on identifying the 5 F's to wellness including Food, Fitness, Fresh air, Fellowship, and Friendship with self and soul. Group members identified areas of wellness that they would like to improve upon and were educated and  offered various resources. Discussion also focused on how the food we eat impacts our mental health, along with the benefits of engaging in physical activity/exercise and getting outside for fresh air. Discussion wrapped up with group members identifying one area of wellness they could improve upon and identified a strategy to do so.    A: [place] was active in her participation of discussion and activity, sharing that she engages in house work as part of her physical activity goal and shops "around in a circle" at the grocery store in order to avoid buying processed foods and "junk." Appeared receptive to education and additional information reviewed during session.   P: Continue to attend PHP OT group sessions 5x week for the remainder of the weeks to promote daily structure, social engagement, and opportunities to develop and utilize adaptive strategies to maximize functional performance in preparation for safe transition and integration back into school, work, and the community. Plan to address topic of sleep hygiene in next OT group session.   Plan - 09/08/21 1525     Occupational performance deficits (Please refer to evaluation for details): ADL's;IADL's;Rest and Sleep;Education;Work;Leisure;Social Participation    Body Structure / Function / Physical Skills ADL    Cognitive Skills Attention;Emotional;Energy/Drive;Learn;Memory;Perception;Problem Solve;Safety Awareness;Temperament/Personality;Thought;Understand    Psychosocial Skills Coping Strategies;Environmental  Adaptations;Habits;Interpersonal Interaction;Routines and Behaviors             Patient will benefit from skilled therapeutic intervention in order to improve the following deficits and impairments:   Body Structure / Function / Physical Skills: ADL Cognitive Skills: Attention, Emotional, Energy/Drive, Learn, Memory, Perception, Problem Solve, Safety Awareness, Temperament/Personality, Thought, Understand Psychosocial Skills:  Coping Strategies, Environmental  Adaptations, Habits, Interpersonal Interaction, Routines and Behaviors   Visit Diagnosis: Difficulty coping  Frontal lobe and executive function deficit  Bipolar 1 disorder [facility])    Problem List Patient Active Problem List   Diagnosis Date Noted   Major depressive disorder, recurrent episode, severe ([place]) 08/27/2021   Migraine 10/28/2014   Neck pain of over 3 months duration 10/28/2014   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[place]) 07/29/2014   Headache(784.0) 11/06/2013   Cervicalgia 11/06/2013   Acute confusional state 11/06/2013   History of stroke in prior 3 months 10/15/2013   Neuropathy of upper extremity 04/23/2013   Unspecified vitamin D deficiency 02/13/2009   Morbid obesity ([place]) 02/13/2009   BIPOLAR AFFECTIVE DISORDER 11/19/2007   Essential hypertension, benign  11/16/2007   Mixed hyperlipidemia 10/03/2007   Hx of tobacco use, presenting hazards to health 10/03/2007   DEPRESSION 10/03/2007   IBS 10/03/2007   FIBROMYALGIA 10/03/2007   Palpitations 10/03/2007    09/08/2021  [name], MOT, OTR/L  09/08/2021, 3:25 PM  [facility] PARTIAL HOSPITALIZATION PROGRAM [place], [place], [address] Phone: [phone]   Fax:  [phone]  Name: ADRIANNAH STEINKAMP MRN: [id number] Date of Birth: 07-Oct-1966

## 2021-09-09 ENCOUNTER — Other Ambulatory Visit: Payer: Self-pay

## 2021-09-09 ENCOUNTER — Ambulatory Visit (HOSPITAL_COMMUNITY): Payer: HMO

## 2021-09-09 ENCOUNTER — Other Ambulatory Visit (HOSPITAL_COMMUNITY): Payer: HMO | Admitting: Licensed Clinical Social Worker

## 2021-09-09 ENCOUNTER — Encounter (HOSPITAL_COMMUNITY): Payer: Self-pay

## 2021-09-09 ENCOUNTER — Other Ambulatory Visit (HOSPITAL_COMMUNITY): Payer: HMO | Admitting: Occupational Therapy

## 2021-09-09 DIAGNOSIS — R4589 Other symptoms and signs involving emotional state: Secondary | ICD-10-CM

## 2021-09-09 DIAGNOSIS — F319 Bipolar disorder, unspecified: Secondary | ICD-10-CM

## 2021-09-09 DIAGNOSIS — R41844 Frontal lobe and executive function deficit: Secondary | ICD-10-CM

## 2021-09-09 DIAGNOSIS — F332 Major depressive disorder, recurrent severe without psychotic features: Secondary | ICD-10-CM

## 2021-09-09 NOTE — Therapy (Signed)
[place], [place], [address] Phone: [phone]   Fax:  [phone] Virtual Visit via Video Note  I connected with Michelle Clements on 09/09/21 at  11:00 AM EDT by a video enabled telemedicine application and verified that I am speaking with the correct person using two identifiers.  Location: Patient: Patient Home Provider: Home Office   I discussed the limitations of evaluation and management by telemedicine and the availability of in person appointments. The patient expressed understanding and agreed to proceed.   I discussed the assessment and treatment plan with the patient. The patient was provided an opportunity to ask questions and all were answered. The patient agreed with the plan and demonstrated an understanding of the instructions.   The patient was advised to call back or seek an in-person evaluation if the symptoms worsen or if the condition fails to improve as anticipated.  I provided 60 minutes of non-face-to-face time during this encounter.   [name], OT/L   Occupational Therapy Treatment  Patient Details  Name: Michelle Clements MRN: [id number] Date of Birth: 05-Apr-1966 Referring Provider (OT): Ricky Ala   Encounter Date: 09/09/2021   OT End of Session - 09/09/21 1213     Visit Number 9    Number of Visits 20    Date for OT Re-Evaluation 09/24/21    Authorization Type [employer]    OT Start Time 1055    OT Stop Time 1155    OT Time Calculation (min) 60 min    Activity Tolerance Patient tolerated treatment well    Behavior During Therapy WFL for tasks assessed/performed             Past Medical History:  Diagnosis Date   Allergic rhinitis    Anemia    Arthritis    Lower back and hips   Asthma    Bipolar disorder ([place])    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[place])    Constipation    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[place])    Depression     Essential hypertension, benign    Fibromyalgia    GERD (gastroesophageal reflux disease)    H/O sleep apnea    Hip pain, left    History of palpitations    Negative Holter monitor   History of pneumonia 1988   Hyperlipidemia    IBS (irritable bowel syndrome)    Neck pain    Poor short term memory    PTSD (post-traumatic stress disorder)    S/P Botox injection 11/2014    For migraine headaches   Type 2 diabetes mellitus ([place])    Urgency of urination     Past Surgical History:  Procedure Laterality Date   CARPAL TUNNEL RELEASE Right 06/10/2013   Procedure: CARPAL TUNNEL RELEASE;  Surgeon: Faythe Ghee, MD;  Location: [facility];  Service: Neurosurgery;  Laterality: Right;  Right Carpal Tunnel Release    CHOLECYSTECTOMY     DENTAL SURGERY  12/2014   mole exc     x 3   MUSCLE BIOPSY  2008   Right leg   TUBAL LIGATION      There were no vitals filed for this visit.   Subjective Assessment - 09/09/21 1213     Currently in Pain? No/denies               OT Education - 09/09/21 1213     Education Details Educated on sleep hygiene and strategies to  improve overall sleep quality    Person(s) Educated Patient    Methods Explanation;Handout    Comprehension Verbalized understanding              OT Short Term Goals - 08/30/21 1309       OT SHORT TERM GOAL #1   Status On-going      OT SHORT TERM GOAL #2   Status On-going      OT SHORT TERM GOAL #3   Status On-going           Group Session:  S: "I had a really vivid and great dream while I was in the hospital"  O: Today's group discussion focused on topic of Sleep Hygiene. Patients reflected on the quality of sleep they typically receive and identified areas that need improvement. Group was given background information on sleep and sleep hygiene, including common sleep disorders. Group members also received information on how to improve one's sleep and introduced a sleep diary as a tool that can be  utilized to track sleep quality over a length of time. Group session ended with patients identifying one or more strategies they could utilize or implement into their sleep routine in order to improve overall sleep quality.    A: Michelle Clements was active in her participation of discussion, sharing that she does not have any current sleep difficulties, however has in the past. Asked appropriate questions and appeared interested and receptive to the education/information received during session.   P: Continue to attend PHP OT group sessions 5x week for 1 weeks to promote daily structure, social engagement, and opportunities to develop and utilize adaptive strategies to maximize functional performance in preparation for safe transition and integration back into school, work, and the community. Plan to address topic of sleep hygiene (continue) in next OT group session.  Plan - 09/09/21 1213     Occupational performance deficits (Please refer to evaluation for details): ADL's;IADL's;Rest and Sleep;Education;Work;Leisure;Social Participation    Body Structure / Function / Physical Skills ADL    Cognitive Skills Attention;Emotional;Energy/Drive;Learn;Memory;Perception;Problem Solve;Safety Awareness;Temperament/Personality;Thought;Understand    Psychosocial Skills Coping Strategies;Environmental  Adaptations;Habits;Interpersonal Interaction;Routines and Behaviors             Patient will benefit from skilled therapeutic intervention in order to improve the following deficits and impairments:   Body Structure / Function / Physical Skills: ADL Cognitive Skills: Attention, Emotional, Energy/Drive, Learn, Memory, Perception, Problem Solve, Safety Awareness, Temperament/Personality, Thought, Understand Psychosocial Skills: Coping Strategies, Environmental  Adaptations, Habits, Interpersonal Interaction, Routines and Behaviors   Visit Diagnosis: Difficulty coping  Frontal lobe and executive function  deficit  Bipolar 1 disorder [facility])    Problem List Patient Active Problem List   Diagnosis Date Noted   Major depressive disorder, recurrent episode, severe ([place]) 08/27/2021   Migraine 10/28/2014   Neck pain of over 3 months duration 10/28/2014   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[place]) 07/29/2014   Headache(784.0) 11/06/2013   Cervicalgia 11/06/2013   Acute confusional state 11/06/2013   History of stroke in prior 3 months 10/15/2013   Neuropathy of upper extremity 04/23/2013   Unspecified vitamin D deficiency 02/13/2009   Morbid obesity ([place]) 02/13/2009   BIPOLAR AFFECTIVE DISORDER 11/19/2007   Essential hypertension, benign 11/16/2007   Mixed hyperlipidemia 10/03/2007   Hx of tobacco use, presenting hazards to health 10/03/2007   DEPRESSION 10/03/2007   IBS 10/03/2007   FIBROMYALGIA 10/03/2007   Palpitations 10/03/2007    09/09/2021  [name], MOT, OTR/L  09/09/2021, 12:14 PM  [facility]  [place], [place], [address] Phone: [phone]   Fax:  [phone]  Name: Michelle Clements MRN: [id number] Date of Birth: Apr 18, 1966

## 2021-09-10 ENCOUNTER — Ambulatory Visit (HOSPITAL_COMMUNITY): Payer: HMO

## 2021-09-10 ENCOUNTER — Ambulatory Visit (HOSPITAL_COMMUNITY): Payer: HMO | Admitting: Psychiatry

## 2021-09-13 ENCOUNTER — Other Ambulatory Visit (HOSPITAL_COMMUNITY): Payer: HMO | Admitting: Licensed Clinical Social Worker

## 2021-09-13 ENCOUNTER — Other Ambulatory Visit: Payer: Self-pay

## 2021-09-13 ENCOUNTER — Other Ambulatory Visit (HOSPITAL_COMMUNITY): Payer: HMO | Admitting: Occupational Therapy

## 2021-09-13 ENCOUNTER — Encounter (HOSPITAL_COMMUNITY): Payer: Self-pay

## 2021-09-13 DIAGNOSIS — F319 Bipolar disorder, unspecified: Secondary | ICD-10-CM

## 2021-09-13 DIAGNOSIS — R4589 Other symptoms and signs involving emotional state: Secondary | ICD-10-CM

## 2021-09-13 DIAGNOSIS — F332 Major depressive disorder, recurrent severe without psychotic features: Secondary | ICD-10-CM | POA: Diagnosis not present

## 2021-09-13 DIAGNOSIS — R41844 Frontal lobe and executive function deficit: Secondary | ICD-10-CM

## 2021-09-13 NOTE — Therapy (Signed)
[place], [place], [address] Phone: [phone]   Fax:  [phone] Virtual Visit via Video Note  I connected with Michelle Clements on 09/13/21 at  11:05 AM EDT by a video enabled telemedicine application and verified that I am speaking with the correct person using two identifiers.  Location: Patient: Patient Home Provider: Home Office   I discussed the limitations of evaluation and management by telemedicine and the availability of in person appointments. The patient expressed understanding and agreed to proceed.  I discussed the assessment and treatment plan with the patient. The patient was provided an opportunity to ask questions and all were answered. The patient agreed with the plan and demonstrated an understanding of the instructions.   The patient was advised to call back or seek an in-person evaluation if the symptoms worsen or if the condition fails to improve as anticipated.  I provided 65 minutes of non-face-to-face time during this encounter.   [name], OT/L   Occupational Therapy Treatment  Patient Details  Name: Michelle Clements MRN: [id number] Date of Birth: 08/25/1966 Referring Provider (OT): Ricky Ala   Encounter Date: 09/13/2021   OT End of Session - 09/13/21 1311     Visit Number 10    Number of Visits 20    Date for OT Re-Evaluation 09/24/21    Authorization Type [employer]    OT Start Time 1105    OT Stop Time 1210    OT Time Calculation (min) 65 min    Activity Tolerance Patient tolerated treatment well    Behavior During Therapy WFL for tasks assessed/performed             Past Medical History:  Diagnosis Date   Allergic rhinitis    Anemia    Arthritis    Lower back and hips   Asthma    Bipolar disorder ([place])    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[place])    Constipation    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[place])    Depression     Essential hypertension, benign    Fibromyalgia    GERD (gastroesophageal reflux disease)    H/O sleep apnea    Hip pain, left    History of palpitations    Negative Holter monitor   History of pneumonia 1988   Hyperlipidemia    IBS (irritable bowel syndrome)    Neck pain    Poor short term memory    PTSD (post-traumatic stress disorder)    S/P Botox injection 11/2014    For migraine headaches   Type 2 diabetes mellitus ([place])    Urgency of urination     Past Surgical History:  Procedure Laterality Date   CARPAL TUNNEL RELEASE Right 06/10/2013   Procedure: CARPAL TUNNEL RELEASE;  Surgeon: Faythe Ghee, MD;  Location: [facility];  Service: Neurosurgery;  Laterality: Right;  Right Carpal Tunnel Release    CHOLECYSTECTOMY     DENTAL SURGERY  12/2014   mole exc     x 3   MUSCLE BIOPSY  2008   Right leg   TUBAL LIGATION      There were no vitals filed for this visit.   Subjective Assessment - 09/13/21 1310     Currently in Pain? No/denies  OT Education - 09/13/21 1311     Education Details Educated on concept of sensory modulation and self-soothing as coping strategies through use of the eight senses    Person(s) Educated Patient    Methods Explanation;Handout    Comprehension Verbalized understanding              OT Short Term Goals - 08/30/21 1309       OT SHORT TERM GOAL #1   Status On-going      OT SHORT TERM GOAL #2   Status On-going      OT SHORT TERM GOAL #3   Status On-going           Group Session:  S: "I never thought about these things, but I think it'll be really helpful."  O: Today's group session focused on topic of sensory modulation and self-soothing through use of the 8 senses. Discussion introduced the concept of sensory modulation and integration, focusing on how we can utilize our body and it's senses to self-soothe or cope, when we are experiencing an over or  under-whelming sensation or feeling. Group members were introduced to a sensory diet checklist as a helpful tool/resource that can be utilized to identify what activities and strategies we prefer and do not prefer based upon our response to different stimulus. The concept of alerting vs calming activities was also introduced to understand how to counteract how we are feeling (Example: when we are feeling overwhelmed/stressed, engage in something calming. When we are feeling depressed/low energy, engage in something alerting). Group members engaged actively in discussion sharing their own personal sensory likes/dislikes.    A: Michelle Clements was active in her participation of discussion and activity, asking several relevant and appropriate questions, while also contributing her sensory preferences. Michelle Clements shared that she never thought about these things, however was excited to learn more and use different activities to her benefit. She shared that looking at photos and videos of her mother from [place] would help to calm her,even though her mother is no longer here in person. Appeared both excited and receptive to utilizing sensory diet tool for future self-soothing benefit.   P: Continue to attend PHP OT group sessions 5x week for the remainder of the week to promote daily structure, social engagement, and opportunities to develop and utilize adaptive strategies to maximize functional performance in preparation for safe transition and integration back into school, work, and the community. Plan to address topic of safety planning in next OT group session.   Plan - 09/13/21 1311     Occupational performance deficits (Please refer to evaluation for details): ADL's;IADL's;Rest and Sleep;Education;Work;Leisure;Social Participation    Body Structure / Function / Physical Skills ADL    Cognitive Skills Attention;Emotional;Energy/Drive;Learn;Memory;Perception;Problem Solve;Safety  Awareness;Temperament/Personality;Thought;Understand    Psychosocial Skills Coping Strategies;Environmental  Adaptations;Habits;Interpersonal Interaction;Routines and Behaviors             Patient will benefit from skilled therapeutic intervention in order to improve the following deficits and impairments:   Body Structure / Function / Physical Skills: ADL Cognitive Skills: Attention, Emotional, Energy/Drive, Learn, Memory, Perception, Problem Solve, Safety Awareness, Temperament/Personality, Thought, Understand Psychosocial Skills: Coping Strategies, Environmental  Adaptations, Habits, Interpersonal Interaction, Routines and Behaviors   Visit Diagnosis: Difficulty coping  Frontal lobe and executive function deficit  Bipolar 1 disorder [facility])    Problem List Patient Active Problem List   Diagnosis Date Noted   Major depressive disorder, recurrent episode, severe ([place]) 08/27/2021   Migraine 10/28/2014   Neck pain of  over 3 months duration 10/28/2014   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[place]) 07/29/2014   Headache(784.0) 11/06/2013   Cervicalgia 11/06/2013   Acute confusional state 11/06/2013   History of stroke in prior 3 months 10/15/2013   Neuropathy of upper extremity 04/23/2013   Unspecified vitamin D deficiency 02/13/2009   Morbid obesity ([place]) 02/13/2009   BIPOLAR AFFECTIVE DISORDER 11/19/2007   Essential hypertension, benign 11/16/2007   Mixed hyperlipidemia 10/03/2007   Hx of tobacco use, presenting hazards to health 10/03/2007   DEPRESSION 10/03/2007   IBS 10/03/2007   FIBROMYALGIA 10/03/2007   Palpitations 10/03/2007    09/13/2021  [name], MOT, OTR/L  09/13/2021, 1:11 PM  [facility] HOSPITALIZATION PROGRAM [place], [place], [address] Phone: [phone]   Fax:  [phone]  Name: Michelle Clements MRN: [id number] Date of Birth: Nov 11, 1966

## 2021-09-14 ENCOUNTER — Other Ambulatory Visit (HOSPITAL_COMMUNITY): Payer: HMO | Admitting: Occupational Therapy

## 2021-09-14 ENCOUNTER — Encounter (HOSPITAL_COMMUNITY): Payer: Self-pay

## 2021-09-14 ENCOUNTER — Other Ambulatory Visit (HOSPITAL_COMMUNITY): Payer: HMO

## 2021-09-14 ENCOUNTER — Other Ambulatory Visit (HOSPITAL_COMMUNITY): Payer: HMO | Admitting: Licensed Clinical Social Worker

## 2021-09-14 ENCOUNTER — Other Ambulatory Visit: Payer: Self-pay

## 2021-09-14 DIAGNOSIS — F332 Major depressive disorder, recurrent severe without psychotic features: Secondary | ICD-10-CM | POA: Diagnosis not present

## 2021-09-14 DIAGNOSIS — F319 Bipolar disorder, unspecified: Secondary | ICD-10-CM

## 2021-09-14 DIAGNOSIS — R4589 Other symptoms and signs involving emotional state: Secondary | ICD-10-CM

## 2021-09-14 DIAGNOSIS — R41844 Frontal lobe and executive function deficit: Secondary | ICD-10-CM

## 2021-09-14 NOTE — Progress Notes (Signed)
Spoke with patient via Webex video call, used 2 identifiers to correctly identify patient. She was in good spirits and states she is being discharged today and starting IOP tomorrow. States she has really enjoyed PHP and will miss it. Her injection last week in her neck when well. She now has about 80% relief from pain. No side effects from medications. On scale 1-10 as 10 being worst she rates depression at 2 and anxiety at 4. Denies SI/HI or AV hallucinations. PHQ9=6 No issues or complaints. No side effects from medications.

## 2021-09-14 NOTE — Progress Notes (Signed)
Virtual Visit via Video Note  I connected with Michelle Clements on 09/14/21 at  9:00 AM EDT by a video enabled telemedicine application and verified that I am speaking with the correct person using two identifiers.  Location: Patient: Home Provider: Office   I discussed the limitations of evaluation and management by telemedicine and the availability of in person appointments. The patient expressed understanding and agreed to proceed.   I discussed the assessment and treatment plan with the patient. The patient was provided an opportunity to ask questions and all were answered. The patient agreed with the plan and demonstrated an understanding of the instructions.   The patient was advised to call back or seek an in-person evaluation if the symptoms worsen or if the condition fails to improve as anticipated.  I provided 15 minutes of non-face-to-face time during this encounter.   Derrill Center, NP   [facility]  Partial Hospitalization Outpatient Program Discharge Summary  Michelle Clements [id number]  Admission date: 08/26/2021 Discharge date: 09/14/2021  Reason for admission: Per admission assessment note: Michelle Clements 55 year old, marriedCaucasian female presents after referral from inpatient admission.  She reported "Intentional overdose".  she reports multiple stressors related to her depression.  States the recent passing of her mother 9/4.  States she became overwhelmed with family members coming into town and making demands about "how things should go ."  Stated she impulsively took a multiple handful of prescription medications.  She reports a history of bipolar disorder, major depressive disorder and  generalized anxiety disorder.  Patient reports she is currently followed by psychiatrist Michelle Clements and therapist Michelle Clements. Reported a recently medication adjustment prior to her inpatient admission.  ` Progress in Program Toward Treatment Goals: Ongoing, patient attended and  participated with daily group session with active and engaged participation.  Denying suicidal or homicidal ideations.  Denies auditory or visual hallucinations.  Continues to present with symptoms of  worry related to pain management in the wintertime.  States she was recently followed by pain management doctor and feels a lot better since her hydrocodone was discontinued however states " I do not know if this pain patch is gone to be strong enough when my arthritis/fibromyalgia  flares up in the winter"  Progress (rationale):  Michelle Clements to start intensive outpatient Programming (IOP) on 09/15/2021  Take all medications as prescribed. Keep all follow-up appointments as scheduled.  Do not consume alcohol or use illegal drugs while on prescription medications. Report any adverse effects from your medications to your primary care provider promptly.  In the event of recurrent symptoms or worsening symptoms, call 911, a crisis hotline, or go to the nearest emergency department for evaluation.    Derrill Center, NP 09/14/2021

## 2021-09-14 NOTE — Therapy (Signed)
[place], [place], [address] Phone: [phone]   Fax:  [phone] Virtual Visit via Video Note  I connected with Michelle Clements on 09/14/21 at  11:00 AM EDT by a video enabled telemedicine application and verified that I am speaking with the correct person using two identifiers.  Location: Patient: Patient Home Provider: Home Office   I discussed the limitations of evaluation and management by telemedicine and the availability of in person appointments. The patient expressed understanding and agreed to proceed.     I discussed the assessment and treatment plan with the patient. The patient was provided an opportunity to ask questions and all were answered. The patient agreed with the plan and demonstrated an understanding of the instructions.   The patient was advised to call back or seek an in-person evaluation if the symptoms worsen or if the condition fails to improve as anticipated.  I provided 60 minutes of non-face-to-face time during this encounter.   [name], OT/L   Occupational Therapy Treatment  Patient Details  Name: Michelle Clements MRN: [id number] Date of Birth: 09/11/1966 Referring Provider (OT): Ricky Ala   Encounter Date: 09/14/2021   OT End of Session - 09/14/21 1215     Visit Number 11    Number of Visits 20    Date for OT Re-Evaluation 09/24/21    Authorization Type [employer]    OT Start Time 1105    OT Stop Time 1205    OT Time Calculation (min) 60 min    Activity Tolerance Patient tolerated treatment well    Behavior During Therapy WFL for tasks assessed/performed             Past Medical History:  Diagnosis Date   Allergic rhinitis    Anemia    Arthritis    Lower back and hips   Asthma    Bipolar disorder ([place])    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[place])    Constipation    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[place])    Depression     Essential hypertension, benign    Fibromyalgia    GERD (gastroesophageal reflux disease)    H/O sleep apnea    Hip pain, left    History of palpitations    Negative Holter monitor   History of pneumonia 1988   Hyperlipidemia    IBS (irritable bowel syndrome)    Neck pain    Poor short term memory    PTSD (post-traumatic stress disorder)    S/P Botox injection 11/2014    For migraine headaches   Type 2 diabetes mellitus ([place])    Urgency of urination     Past Surgical History:  Procedure Laterality Date   CARPAL TUNNEL RELEASE Right 06/10/2013   Procedure: CARPAL TUNNEL RELEASE;  Surgeon: Faythe Ghee, MD;  Location: [facility];  Service: Neurosurgery;  Laterality: Right;  Right Carpal Tunnel Release    CHOLECYSTECTOMY     DENTAL SURGERY  12/2014   mole exc     x 3   MUSCLE BIOPSY  2008   Right leg   TUBAL LIGATION      There were no vitals filed for this visit.   Subjective Assessment - 09/14/21 1214     Currently in Pain? No/denies              OT Education - 09/14/21 1215     Education Details Educated on self-esteem/self-worth and the role  mental health plays in this; additionally reviewed and engaged in a strengths exploration activity    Person(s) Educated Patient    Methods Explanation;Handout    Comprehension Verbalized understanding              OT Short Term Goals - 09/14/21 1215       OT SHORT TERM GOAL #1   Title Pt will actively engage in OT group sessions throughout duration of [facility] programming, in order to promote daily structure, social engagement, and opportunities to develop and utilize adaptive strategies to maximize functional performance in preparation for safe transition and integration back into school, work, and the community.    Status Achieved      OT SHORT TERM GOAL #2   Title Pt will identify and implement 1-3 sleep hygiene strategies she can utilize, in order to improve sleep quality/ADL performance, in preparation for safe  and healthy reintegration back into the community at discharge.    Status Achieved      OT SHORT TERM GOAL #3   Title Pt will identify 1-3 stress management strategies she can utilize, in order to safely manage increased psychosocial stressors identified, with min cues, in preparation for safe transition back to the community at discharge.    Status Achieved           Group Session:  S: "leadership, empathy, creativity, adventurousness, kindness, spirituality, flexibility, and independence"   O: Group discussion focused on topic of self-esteem with an activity and emphasis focused on Strengths Exploration. Group members were given a handout of various strengths and asked to identify ten strengths they currently possess. Group members were also asked to identify from these strengths, which ones were strengths in relationships, profession, and personal fulfillment. Discussion also focused on strengths we would like to possess or improve on and related use/recognition of strengths as a strategy to boost confidence and emphasize healthy self-esteem.   A: Michelle Clements was active and independent in her participation of discussion and activity, sharing and highlighting several strengths of hers including, "leadership, empathy, creativity, adventurousness, kindness, spirituality, flexibility, and independence". She highlighted how each of these have served a role in her relationships, profession, and leisure pursuits.   P: Michelle Clements will discharge from the [facility] program, effective today, with plan to follow up and begin [facility] tomorrow, 09/15/21  OCCUPATIONAL THERAPY DISCHARGE SUMMARY  Visits from Start of Care: 11  Current functional level related to goals / functional outcomes: Michelle Clements has achieved 3/3 OT goals during her time in the [facility] program, really stepping outside of her comfort zone and growing in her ability to manage her anxiety and utilize skills taught during group. Pt is ready and agreeable to  discharge at this time with plan to follow up and begin [place] tomorrow 09/15/21 for ongoing therapy and treatment needs.    Remaining deficits: See above   Education / Equipment: See above   Patient agrees to discharge. Patient goals were met. Patient is being discharged due to meeting the stated rehab goals..      Plan - 09/14/21 1215     Occupational performance deficits (Please refer to evaluation for details): ADL's;IADL's;Rest and Sleep;Education;Work;Leisure;Social Participation    Body Structure / Function / Physical Skills ADL    Cognitive Skills Attention;Emotional;Energy/Drive;Learn;Memory;Perception;Problem Solve;Safety Awareness;Temperament/Personality;Thought;Understand    Psychosocial Skills Coping Strategies;Environmental  Adaptations;Habits;Interpersonal Interaction;Routines and Behaviors             Patient will benefit from skilled therapeutic intervention in order to improve the following  deficits and impairments:   Body Structure / Function / Physical Skills: ADL Cognitive Skills: Attention, Emotional, Energy/Drive, Learn, Memory, Perception, Problem Solve, Safety Awareness, Temperament/Personality, Thought, Understand Psychosocial Skills: Coping Strategies, Environmental  Adaptations, Habits, Interpersonal Interaction, Routines and Behaviors   Visit Diagnosis: Difficulty coping  Frontal lobe and executive function deficit  Bipolar 1 disorder [facility])    Problem List Patient Active Problem List   Diagnosis Date Noted   Major depressive disorder, recurrent episode, severe ([place]) 08/27/2021   Migraine 10/28/2014   Neck pain of over 3 months duration 10/28/2014   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   Headache(784.0) 11/06/2013   Cervicalgia 11/06/2013   Acute confusional state 11/06/2013   History of stroke in prior 3 months 10/15/2013   Neuropathy of upper extremity 04/23/2013   Unspecified vitamin D deficiency 02/13/2009   Morbid  obesity ([place]) 02/13/2009   BIPOLAR AFFECTIVE DISORDER 11/19/2007   Essential hypertension, benign 11/16/2007   Mixed hyperlipidemia 10/03/2007   Hx of tobacco use, presenting hazards to health 10/03/2007   DEPRESSION 10/03/2007   IBS 10/03/2007   FIBROMYALGIA 10/03/2007   Palpitations 10/03/2007    09/14/2021  [name], MOT, OTR/L  09/14/2021, 12:16 PM  [place], [place], [address] Phone: [phone]   Fax:  [phone]  Name: Michelle Clements MRN: [id number] Date of Birth: November 11, 1966

## 2021-09-15 ENCOUNTER — Encounter (HOSPITAL_COMMUNITY): Payer: Self-pay | Admitting: Family

## 2021-09-15 ENCOUNTER — Other Ambulatory Visit: Payer: Self-pay

## 2021-09-15 ENCOUNTER — Other Ambulatory Visit (HOSPITAL_COMMUNITY): Payer: HMO | Admitting: Psychiatry

## 2021-09-15 ENCOUNTER — Ambulatory Visit (HOSPITAL_COMMUNITY): Payer: HMO

## 2021-09-15 ENCOUNTER — Other Ambulatory Visit (HOSPITAL_COMMUNITY): Payer: HMO

## 2021-09-15 DIAGNOSIS — R41844 Frontal lobe and executive function deficit: Secondary | ICD-10-CM | POA: Diagnosis not present

## 2021-09-15 DIAGNOSIS — R4589 Other symptoms and signs involving emotional state: Secondary | ICD-10-CM | POA: Diagnosis not present

## 2021-09-15 DIAGNOSIS — F332 Major depressive disorder, recurrent severe without psychotic features: Secondary | ICD-10-CM | POA: Diagnosis not present

## 2021-09-15 DIAGNOSIS — F419 Anxiety disorder, unspecified: Secondary | ICD-10-CM | POA: Diagnosis not present

## 2021-09-15 DIAGNOSIS — Z818 Family history of other mental and behavioral disorders: Secondary | ICD-10-CM | POA: Diagnosis not present

## 2021-09-15 DIAGNOSIS — Z79899 Other long term (current) drug therapy: Secondary | ICD-10-CM | POA: Diagnosis not present

## 2021-09-15 DIAGNOSIS — Z87891 Personal history of nicotine dependence: Secondary | ICD-10-CM | POA: Diagnosis not present

## 2021-09-15 DIAGNOSIS — Z560 Unemployment, unspecified: Secondary | ICD-10-CM | POA: Diagnosis not present

## 2021-09-15 DIAGNOSIS — F319 Bipolar disorder, unspecified: Secondary | ICD-10-CM

## 2021-09-15 DIAGNOSIS — Z9151 Personal history of suicidal behavior: Secondary | ICD-10-CM | POA: Diagnosis not present

## 2021-09-15 NOTE — Progress Notes (Signed)
Virtual Visit via Video Note  I connected with Michelle Clements on 09/15/21 at  9:00 AM EDT by a video enabled telemedicine application and verified that I am speaking with the correct person using two identifiers.  Location: Patient: Home Provider: Office   I discussed the limitations of evaluation and management by telemedicine and the availability of in person appointments. The patient expressed understanding and agreed to proceed.   I discussed the assessment and treatment plan with the patient. The patient was provided an opportunity to ask questions and all were answered. The patient agreed with the plan and demonstrated an understanding of the instructions.   The patient was advised to call back or seek an in-person evaluation if the symptoms worsen or if the condition fails to improve as anticipated.  I provided 15 minutes of non-face-to-face time during this encounter.   Derrill Center, NP    Psychiatric Initial Adult Assessment   Patient Identification: [name] MRN:  [id number] Date of Evaluation:  09/15/2021 Referral Source: Stepdown from partial hospitalization programming Chief Complaint:    Worsening depression and anxiety Visit Diagnosis: Major depression disorder   History of Present Illness:  Per admission assessment note: Michelle Clements 55 year old, [place] female presents after referral from inpatient admission.  She reported "Intentional overdose".  she reports multiple stressors related to her depression.  States the recent passing of her mother 9/4.  States she became overwhelmed with family members coming into town and making demands about "how things should go ."  Stated she impulsively took a multiple handful of prescription medications.  She reports a history of bipolar disorder, major depressive disorder and  generalized anxiety disorder.  Patient reports she is currently followed by psychiatrist Harrington Challenger and therapist Maurice Small. Reported a recently  medication adjustment prior to her inpatient admission.   Evaluation: Jenice reports overall she is doing and feeling better.  Patient to transition to intensive outpatient programming on 09/15/2021.  Denying suicidal or homicidal ideations.  Denies auditory or visual hallucinations.  Reports a recent visit to pain management and feels tired constantly.  States Sales promotion account executive with setting boundaries for family members.  Denied medication refills at this time.  Overall reports her depression and anxiety 6 out of 10 with 10 being the worst.  Reports a good appetite.  States she is resting well throughout the night.   Associated Signs/Symptoms: Depression Symptoms:  depressed mood, suicidal attempt, anxiety, (Hypo) Manic Symptoms:  Distractibility, Elevated Mood, Irritable Mood, Labiality of Mood, Anxiety Symptoms:  Excessive Worry, Psychotic Symptoms:  Hallucinations: None PTSD Symptoms: NA  Past Psychiatric History:   Previous Psychotropic Medications: No   Substance Abuse History in the last 12 months:  No.  Consequences of Substance Abuse: NA  Past Medical History:  Past Medical History:  Diagnosis Date   Allergic rhinitis    Anemia    Arthritis    Lower back and hips   Asthma    Bipolar disorder ([place])    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HCC)    Constipation    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HCC)    Depression    Essential hypertension, benign    Fibromyalgia    GERD (gastroesophageal reflux disease)    H/O sleep apnea    Hip pain, left    History of palpitations    Negative Holter monitor   History of pneumonia 1988   Hyperlipidemia    IBS (irritable bowel syndrome)    Neck pain    Poor short  term memory    PTSD (post-traumatic stress disorder)    S/P Botox injection 11/2014    For migraine headaches   Type 2 diabetes mellitus ([place])    Urgency of urination     Past Surgical History:  Procedure Laterality Date   CARPAL TUNNEL RELEASE Right 06/10/2013    Procedure: CARPAL TUNNEL RELEASE;  Surgeon: Faythe Ghee, MD;  Location: [facility];  Service: Neurosurgery;  Laterality: Right;  Right Carpal Tunnel Release    CHOLECYSTECTOMY     DENTAL SURGERY  12/2014   mole exc     x 3   MUSCLE BIOPSY  2008   Right leg   TUBAL LIGATION      Family Psychiatric History:   Family History:  Family History  Problem Relation Age of Onset   Fibromyalgia Mother    Diabetes type II Mother    Depression Mother    Alzheimer's disease Mother    GER disease Mother    Heart disease Mother    Paranoid behavior Mother    Dementia Mother    Heart attack Father 66       MI x5   Stroke Father        CVA x7   Asthma Father    Brain cancer Father    Seizures Father    Anxiety disorder Sister    OCD Sister    Sexual abuse Sister    Physical abuse Sister    Anxiety disorder Sister    ADD / ADHD Daughter    ADD / ADHD Daughter    Alcohol abuse Neg Hx    Drug abuse Neg Hx    Schizophrenia Neg Hx     Social History:   Social History   Socioeconomic History   Marital status: Married    Spouse name: Not on file   Number of children: 2   Years of education: College   Highest education level: Associate degree: academic program  Occupational History   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   Caffeine use: 2 cups coffee in morning and 2 cups tea during the day    Social Determinants of Health   Financial  Resource Strain: Low Risk    Difficulty of Paying Living Expenses: Not very hard  Food Insecurity: No Food Insecurity   Worried About Charity fundraiser in the Last Year: Never true   Ran Out of Food in the Last Year: Never true  Transportation Needs: No Transportation Needs   Lack of Transportation (Medical): No   Lack of Transportation (Non-Medical): No  Physical Activity: Inactive   Days of Exercise per Week: 0 days   Minutes of Exercise per Session: 0 min  Stress: No Stress Concern Present   Feeling of Stress : Not at all  Social Connections: Moderately Integrated  Frequency of Communication with Friends and Family: More than three times a week   Frequency of Social Gatherings with Friends and Family: More than three times a week   Attends Religious Services: More than 4 times per year   Active Member of [employer] or Organizations: No   Attends Archivist Meetings: Never   Marital Status: Married    Additional Social History:   Allergies:   Allergies  Allergen Reactions   Molds & Smuts Shortness Of Breath    wheezing   Other Anaphylaxis, Shortness Of Breath and Other (See Comments)     : Angioedema wheezing Wheezing wheezing Other reaction(s): Cough Wheezing Itching and rash Wheezing wheezing Wheezing wheezing Other reaction(s): Other (See Comments)  : Angioedema wheezing Wheezing wheezing Other reaction(s): Cough Wheezing Itching and rash Wheezing wheezing Other reaction(s): Other (See Comments) Wheezing wheezing  : Angioedema wheezing Wheezing wheezing Other reaction(s): Cough Wheezing Itching and rash Wheezing wheezing Wheezing wheezing   Statins Anaphylaxis   Sulfa Antibiotics Anaphylaxis     : Angioedema    Sulfonamide Derivatives Anaphylaxis     : Angioedema   Trichophyton Shortness Of Breath    wheezing wheezing wheezing wheezing wheezing wheezing wheezing wheezing wheezing   Carbamazepine Rash   Dust Mite Extract  Cough and Other (See Comments)    Wheezing Other reaction(s): Wheezing Wheezing Wheezing Other reaction(s): Wheezing Wheezing Wheezing Other reaction(s): Wheezing Wheezing   Gluten Meal Itching   Clonazepam Other (See Comments)    Confusion and memory loss Caused nconfusion    Metabolic Disorder Labs: Lab Results  Component Value Date   HGBA1C 5.7 08/25/2021   MPG 131 03/18/2015   No results found for: PROLACTIN Lab Results  Component Value Date   CHOL 196 08/06/2021   TRIG 144 08/06/2021   HDL 54 08/06/2021   CHOLHDL 4.2 03/18/2015   VLDL 44 (H) 03/18/2015   LDLCALC 117 08/06/2021   LDLCALC 64 12/29/2020   Lab Results  Component Value Date   TSH 2.69 12/29/2020    Therapeutic Level Labs: Lab Results  Component Value Date   LITHIUM 0.20 (L) 02/21/2012   No results found for: CBMZ No results found for: VALPROATE  Current Medications: Current Outpatient Medications  Medication Sig Dispense Refill   albuterol (PROVENTIL HFA;VENTOLIN HFA) 108 (90 BASE) MCG/ACT inhaler Inhale 2 puffs into the lungs every 6 (six) hours as needed for wheezing or shortness of breath.     Ascorbic Acid (VITAMIN C) 1000 MG tablet Take 1,000 mg by mouth daily. (Patient not taking: No sig reported)     Blood Glucose Monitoring Suppl ([place]) w/Device KIT See admin instructions.     buPROPion (WELLBUTRIN XL) 150 MG 24 hr tablet Take 1 tablet (150 mg total) by mouth every morning. 30 tablet 2   cetirizine (ZYRTEC) 10 MG tablet Take 10 mg by mouth at bedtime.     Cholecalciferol (VITAMIN D) 50 MCG (2000 UT) tablet Take 2,000 Units by mouth daily. (Patient not taking: No sig reported)     clonazePAM (KLONOPIN) 1 MG tablet Take 1 tablet (1 mg total) by mouth 2 (two) times daily as needed for anxiety. 60 tablet 2   estradiol (ESTRACE) 0.1 MG/GM vaginal cream Place vaginally. (Patient not taking: No sig reported)     FLUoxetine (PROZAC) 40 MG capsule Take 1 capsule (40 mg  total) by mouth daily. 30 capsule 2   fluticasone (FLONASE) 50 MCG/ACT nasal spray Place 1 spray into both nostrils daily.  Glucos-Chond-Hyal Ac-Ca Fructo (MOVE FREE JOINT HEALTH ADVANCE PO) Take by mouth.     ibuprofen (ADVIL) 800 MG tablet Take 800 mg by mouth 2 (two) times daily.     insulin glargine (LANTUS SOLOSTAR) 100 UNIT/ML Solostar Pen Inject 50 Units into the skin daily.     Lancets (ONETOUCH ULTRASOFT) lancets 1 each by Other route as needed.      lubiprostone (AMITIZA) 24 MCG capsule TAKE ONE CAPSULE BY MOUTH EVERY MORNING and TAKE ONE CAPSULE BY MOUTH EVERY EVENING     Melatonin 5 MG TABS Take 5 mg by mouth at bedtime as needed.      Multiple Vitamin (MULTIVITAMIN) tablet Take 1 tablet by mouth daily.     naproxen sodium (ALEVE) 220 MG tablet Take 220 mg by mouth 2 (two) times daily as needed. (Patient not taking: Reported on 09/07/2021)     omeprazole (PRILOSEC) 20 MG capsule Take 20 mg by mouth daily.     ONETOUCH VERIO test strip 4 (four) times daily.     pregabalin (LYRICA) 200 MG capsule Take 200 mg by mouth 3 (three) times daily.     rosuvastatin (CRESTOR) 20 MG tablet Take 1 tablet (20 mg total) by mouth at bedtime. 90 tablet 3   sennosides-docusate sodium (SENOKOT-S) 8.6-50 MG tablet Take 1 tablet by mouth daily.     tiZANidine (ZANAFLEX) 4 MG tablet Take 4 mg by mouth daily. 73m at bedtime and 2 mg during the day     traZODone (DESYREL) 50 MG tablet Take 1 tablet (50 mg total) by mouth at bedtime. 30 tablet 2   vitamin B-12 (CYANOCOBALAMIN) 100 MCG tablet Take 100 mcg by mouth daily. (Patient not taking: No sig reported)     zinc gluconate 50 MG tablet Take 50 mg by mouth daily. (Patient not taking: No sig reported)     No current facility-administered medications for this visit.    Musculoskeletal: Strength & Muscle Tone: within normal limits Gait & Station: normal Patient leans: N/A  Psychiatric Specialty Exam: Review of Systems  Last menstrual period  01/04/2018.There is no height or weight on file to calculate BMI.  General Appearance: Casual  Eye Contact:  Good  Speech:  Clear and Coherent  Volume:  Normal  Mood:  Anxious and Depressed  Affect:  Congruent  Thought Process:  Coherent  Orientation:  NA  Thought Content:  Logical and Rumination  Suicidal Thoughts:  No  Homicidal Thoughts:  No  Memory:  Immediate;   Fair Recent;   Fair Remote;   Fair  Judgement:  Fair  Insight:  Fair  Psychomotor Activity:  Normal  Concentration:  Concentration: Good  Recall:  Good  Fund of Knowledge:Good  Language: Good  Akathisia:  No  Handed:  Right  AIMS (if indicated):  done  Assets:  Communication Skills Desire for Improvement Resilience Social Support  ADL's:  Intact  Cognition: WNL  Sleep:  Good   Screenings: Mini-Mental    Flowsheet Row [facility]  Total Score (max 30 points ) 27      PHQ2-9    Flowsheet Row Counselor [facility]20[place] [facility] [facility] [facility] [facility] 08[facility]06/2021 [facility]  PHQ-2 Total Score 1 0 2 3 6   PHQ-9 Total Score  6 4 11 5 13       Flowsheet Row Video Visit from 09/02/2021 in [place] [facility] Video Visit from 08/19/2021 in [place] [facility] Video Visit from 08/06/2021 in [place] Error: Q3, 4, or 5 should not be populated when Q2 is No Error: Q3, 4, or 5 should not be populated when Q2 is No Error: Question 6 not populated       Assessment and Plan:    Patient to start Intensive  Outpatient Programming -Continue medications as directed  Treatment plan was reviewed and agreed upon by NP. T.Sloane Palmer and patient Gala Padovano need for group service.      Derrill Center, NP 10/12/20228:48 AM

## 2021-09-15 NOTE — Progress Notes (Signed)
Virtual Visit via Video Note  I connected with Michelle Clements on @TODAY @ at  9:00 AM EDT by a video enabled telemedicine application and verified that I am speaking with the correct person using two identifiers.  Location: Patient: at home Provider: at office   I discussed the limitations of evaluation and management by telemedicine and the availability of in person appointments. The patient expressed understanding and agreed to proceed.  I discussed the assessment and treatment plan with the patient. The patient was provided an opportunity to ask questions and all were answered. The patient agreed with the plan and demonstrated an understanding of the instructions.   The patient was advised to call back or seek an in-person evaluation if the symptoms worsen or if the condition fails to improve as anticipated.  I provided 20 minutes of non-face-to-face time during this encounter.   Dellia Nims, M.Ed,CNA   Patient ID: Michelle Clements, female   DOB: Jul 31, 1966, 55 y.o.   MRN: [id number] D:  Pt was transitioned from PHP to [place] starting today.  As previous progress notes states:  This is her first time in [facility] after being referred from [facility]. She spent 8 days in- patient after an OD of Hydrocodone. Her mother died August 22, 2023. She was the main caretaker for mother and after her death her sister was wanting to control what happened for the funeral. Also had an argument with her oldest daughter about not including her ex-husband in the funeral. She went to her room and took an entire bottle of Hydrocodone but knew her husband would come check on her. That was her first suicide attempt. She does not want to die and no longer feels suicidal. Denies HI or AV hallucinations. On scale 1-10 as 10 being worst she rates depression at 3 and anxiety at 5. PHQ9=11. No issues or complaints. No side effects from medications A:  Oriented pt for MH-IOP.Pt gave verbal consent for tx, to release chart info to referred  providers and to complete any forms if needed.  Pt also gave consent for attending group virtually d/t COVID-19 social distancing restrictions.  Encouraged support groups and grief/loss group.   F/U with Dr. Harrington Challenger and Maurice Small, LCSW.  R:  Pt receptive.  Dellia Nims, M.Ed,CNA

## 2021-09-15 NOTE — Progress Notes (Signed)
Virtual Visit via Video Note   I connected with Michelle Clements. Baik on 09/15/21 at  9:00 AM EDT by a video enabled telemedicine application and verified that I am speaking with the correct person using two identifiers.   At orientation to the IOP program, Case Manager discussed the limitations of evaluation and management by telemedicine and the availability of in person appointments. The patient expressed understanding and agreed to proceed with virtual visits throughout the duration of the program.   Location:  Patient: Patient Home Provider: [facility] [place] Office   History of Present Illness: Bipolar Disorder    Observations/Objective: Check In: Case Manager checked in with all participants to review discharge dates, insurance authorizations, work-related documents and needs from the treatment team regarding medications. Michelle Clements stated needs and engaged in discussion.    Initial Therapeutic Activity: Counselor facilitated a check-in with Michelle Clements to assess for safety, sobriety and medication compliance.  Clinician also inquired about Michelle Clements's current emotional ratings, as well as any significant changes in thoughts, feelings or behavior since previous check in.  Michelle Clements presented for session on time and was alert, oriented x5, with no evidence or self-report of SI/HI or A/V H.  Michelle Clements reported compliance with medication and denied use of alcohol or illicit substances.  Michelle Clements reported scores of 2/10 for depression, 4/10 for anxiety, and 0/10 for anger/irritability.  Michelle Clements denied any recent panic attacks or outbursts.  Michelle Clements reported recent success of taking a hike with her husband, stating "We have land uphill and rode the four wheeler up there and across the creek to take a walk".  Michelle Clements reported current struggle of continuing to grieve the loss of her mother and worrying about how it will affect her emotionally when she needs to begin sorting through her belongings.     Second Therapeutic Activity: Clinician  continued discussion upon topic of self-care today with group members.  Clinician virtually shared self-care assessment form with members via Webex to complete final sub-categories (i.e. social, spiritual, and professional).  Members were asked to rank their engagement in the activities listed for each dimension on a scale of 1-3, with 1 indicating 'Poor', 2 indicating 'Ok', and 3 indicating 'Well'.  Clinician invited members to share results of this assessment, and inquired about which areas of self-care they are doing well in, as well as areas that require attention, and how they plan to begin addressing this during treatment.  Intervention was effective, as evidenced by Michelle Clements successfully completing final sections of assessment and actively engaging in discussion on subject, reporting that she is excelling in areas such as spending time with people she likes, having intimate time with her romantic partner, outreaching family/friends she hasn't talked to in [place], acting in accordance with her morals and values, and maintaining balance between personal and professional life, but would benefit from focusing more on areas such as meeting new people, recognizing what gives meaning to her life, participating in a meaningful cause, and saying 'no' to excessive responsibilities.  Michelle Clements reported that she would work to improve self-care deficits by taking classes at the [facility] to meet new people, outreach friends she has lost touch with through social media to reconnect, finding activities through her church to participate in which offer purpose, and being more assertive with family members about helping with fair distribution of household responsibilities.     Third Therapeutic Activity: Clinician offered to teach group members an ACT relaxation technique today to aid in managing difficult thoughts, feelings, urges, and sensations.  Clinician guided members through process of getting comfortable, achieving relaxing  breathing rhythm, and then maintaining this throughout activity.  Clinician invited members to imagine a gently flowing stream in their mind with leaves floating upon it, and when any thoughts, feelings, urges, or sensations arose, good or bad, they were instructed to visualize placing them on these passing leaves over course of 10 minutes practice.  Intervention was effective, as evidenced by Michelle Clements successfully participating in activity and reporting that she enjoyed it, stating "I was able to see the stream, and the leaves.  It was nice to stick my thoughts on the leaves and let go".  Michelle Clements reported feeling more relaxed afterward.    Assessment and Plan: Counselor recommends that Michelle Clements remain in IOP treatment to better manage mental health symptoms, ensure stability and pursue completion of treatment plan goals. Counselor recommends adherence to crisis/safety plan, taking medications as prescribed, and following up with medical professionals if any issues arise.     Follow Up Instructions: Counselor will send Webex link for next session. Michelle Clements was advised to call back or seek an in-person evaluation if the symptoms worsen or if the condition fails to improve as anticipated.     I provided 180 minutes of non-face-to-face time during this encounter.     Shade Flood, LCSW, LCAS 09/15/21

## 2021-09-16 ENCOUNTER — Other Ambulatory Visit (HOSPITAL_COMMUNITY): Payer: HMO

## 2021-09-16 ENCOUNTER — Other Ambulatory Visit: Payer: Self-pay

## 2021-09-16 ENCOUNTER — Ambulatory Visit (HOSPITAL_COMMUNITY): Payer: HMO

## 2021-09-16 ENCOUNTER — Other Ambulatory Visit (HOSPITAL_COMMUNITY): Payer: HMO | Admitting: Psychiatry

## 2021-09-16 DIAGNOSIS — F332 Major depressive disorder, recurrent severe without psychotic features: Secondary | ICD-10-CM

## 2021-09-16 DIAGNOSIS — F319 Bipolar disorder, unspecified: Secondary | ICD-10-CM

## 2021-09-16 NOTE — Progress Notes (Signed)
Virtual Visit via Video Note   I connected with Michelle Clements on 09/16/21 at  9:00 AM EDT by a video enabled telemedicine application and verified that I am speaking with the correct person using two identifiers.   At orientation to the IOP program, Case Manager discussed the limitations of evaluation and management by telemedicine and the availability of in person appointments. The patient expressed understanding and agreed to proceed with virtual visits throughout the duration of the program.   Location:  Patient: Patient Home Provider: OPT [place] Office   History of Present Illness: Bipolar Disorder    Observations/Objective: Check In: Case Manager checked in with all participants to review discharge dates, insurance authorizations, work-related documents and needs from the treatment team regarding medications. Michelle Clements stated needs and engaged in discussion.    Initial Therapeutic Activity: Counselor facilitated a check-in with Michelle Clements to assess for safety, sobriety and medication compliance.  Clinician also inquired about Michelle Clements's current emotional ratings, as well as any significant changes in thoughts, feelings or behavior since previous check in.  Michelle Clements presented for session on time and was alert, oriented x5, with no evidence or self-report of SI/HI or A/V H.  Michelle Clements reported compliance with medication and denied use of alcohol or illicit substances.  Michelle Clements reported scores of 2/10 for depression, 4/10 for anxiety, and 0/10 for anger/irritability.  Michelle Clements denied any recent panic attacks or outbursts.  Michelle Clements reported one recent struggle of driving her granddaughter to a doctor's appointment yesterday, only to discover that she was headed the wrong way mid-trip, which led to anxiety since she worried she would miss the appointment.  Michelle Clements reported that her success was using coping skills to calm down and focus so she could get there on time after all.     Second Therapeutic Activity: Counselor introduced  topic of assertive communication today.  Counselor shared various handouts with members virtually in group to read along with on the subject.  These handouts defined assertive communication as a communication style in which a person stands up for their own needs and wants, while also taking into consideration the needs and wants of others, without behaving in a passive or aggressive way.  Traits of assertive communicators were highlighted such as using appropriate speaking volume, maintaining eye contact, using confident language, and avoiding interruption.  Members were also provided with tips on how to improve communication, including respecting oneself, expressing thoughts and feelings calmly, and saying "No" when necessary.  Members were given a variety of scenarios where they could practice using these tips to respond in an assertive manner.  Intervention was effective, as evidenced by Michelle Clements successfully participating in discussion on the topic and reporting that she has a passive communication style which was reinforced by her family's upbringing, leading to traits such as putting other's needs before her own, and speaking quietly, stating "I basically let everyone walk all over me, but that's the way I was raised".  Michelle Clements reported that she began to improve her communication style after leaving her ex husband, and one behavior that she needs to change is engaging in 'silent treatment' when she is overwhelmed.  Michelle Clements also actively participated in roleplay activities in order to improve her communication skills.  Michelle Clements reported that she would start being more assertive in the household in order to address unfair distribution of chores with her children.       Assessment and Plan: Counselor recommends that Michelle Clements remain in IOP treatment to better manage mental health symptoms,  ensure stability and pursue completion of treatment plan goals. Counselor recommends adherence to crisis/safety plan, taking medications  as prescribed, and following up with medical professionals if any issues arise.     Follow Up Instructions: Counselor will send Webex link for next session. Michelle Clements was advised to call back or seek an in-person evaluation if the symptoms worsen or if the condition fails to improve as anticipated.     I provided 180 minutes of non-face-to-face time during this encounter.     Shade Flood, LCSW, LCAS 09/16/21

## 2021-09-16 NOTE — Patient Instructions (Addendum)
D:  Patient is requesting discharge today.  A:  Discharge today per patient's request.  Follow up with Dr. Harrington Challenger on 09-22-21 and patient states she will call Maurice Small, LCSW for a follow up appt.  Strongly encouraged support groups through [facility] and grief counseling.  R:  Patient receptive.

## 2021-09-16 NOTE — Progress Notes (Signed)
Virtual Visit via Video Note  I connected with Michelle Clements on @TODAY @ at  9:00 AM EDT by a video enabled telemedicine application and verified that I am speaking with the correct person using two identifiers.  Location: Patient: at home Provider: at office   I discussed the limitations of evaluation and management by telemedicine and the availability of in person appointments. The patient expressed understanding and agreed to proceed.  I discussed the assessment and treatment plan with the patient. The patient was provided an opportunity to ask questions and all were answered. The patient agreed with the plan and demonstrated an understanding of the instructions.   The patient was advised to call back or seek an in-person evaluation if the symptoms worsen or if the condition fails to improve as anticipated.  I provided 20 minutes of non-face-to-face time during this encounter.   Dellia Nims, M.Ed,CNA   Patient ID: Michelle Clements, female   DOB: [birth year], 55 y.o.   MRN: [id number] D:  Pt was transitioned from PHP to [place] starting today.  As previous progress notes states:  This is her first time in [facility] after being referred from [facility]. She spent 8 days in- patient after an OD of Hydrocodone. Her mother died 10-Aug-2023. She was the main caretaker for mother and after her death her sister was wanting to control what happened for the funeral. Also had an argument with her oldest daughter about not including her ex-husband in the funeral. She went to her room and took an entire bottle of Hydrocodone but knew her husband would come check on her. That was her first suicide attempt. She does not want to die and no longer feels suicidal. Denies HI or AV hallucinations. On scale 1-10 as 10 being worst she rates depression at 3 and anxiety at 5. PHQ9=11. No issues or complaints. No side effects from medications.  Pt started MH-IOP yesterday, but is requesting discharge today.  "I felt like I was the only  person talking in the groups.  No one talks.  It is so different from [facility]  Case manager explained to pt that the group is always changing because of new patients coming in and current ones discharging.  Expressed to patient that eventually the PHP patients will be stepping down to MH-IOP; in fact one will be starting next week.  Pt still declined; requesting discharge today. Reports overall mood improved.  Continues to grieve the loss of mother and struggles with anxiety at times.  On a scale of 1-10 (10 being the worst); pt rates her anxiety at a 4 and depression at a 1.  Denies SI/HI or A/V hallucinations.  A:  D/C today.  F/U with Dr. Harrington Challenger on 09-22-21 and pt is to call Maurice Small, LCSW for an appt.  Pt states she will f/u with [facility] is virtual[facility] and Hospice every few weeks.  Inform Ricky Ala, NP and Shade Flood, LCSW.  R: Patient receptive.   Dellia Nims, M.Ed,CNA

## 2021-09-17 ENCOUNTER — Other Ambulatory Visit (HOSPITAL_COMMUNITY): Payer: HMO

## 2021-09-17 ENCOUNTER — Other Ambulatory Visit: Payer: Self-pay

## 2021-09-17 ENCOUNTER — Ambulatory Visit (HOSPITAL_COMMUNITY): Payer: HMO

## 2021-09-19 ENCOUNTER — Encounter (HOSPITAL_COMMUNITY): Payer: Self-pay | Admitting: Family

## 2021-09-20 ENCOUNTER — Ambulatory Visit (HOSPITAL_COMMUNITY): Payer: HMO

## 2021-09-21 ENCOUNTER — Ambulatory Visit (HOSPITAL_COMMUNITY): Payer: HMO

## 2021-09-21 DIAGNOSIS — S134XXA Sprain of ligaments of cervical spine, initial encounter: Secondary | ICD-10-CM | POA: Diagnosis not present

## 2021-09-21 DIAGNOSIS — S338XXA Sprain of other parts of lumbar spine and pelvis, initial encounter: Secondary | ICD-10-CM | POA: Diagnosis not present

## 2021-09-21 DIAGNOSIS — S233XXA Sprain of ligaments of thoracic spine, initial encounter: Secondary | ICD-10-CM | POA: Diagnosis not present

## 2021-09-22 ENCOUNTER — Telehealth (HOSPITAL_COMMUNITY): Payer: Self-pay | Admitting: Psychiatry

## 2021-09-22 ENCOUNTER — Ambulatory Visit (HOSPITAL_COMMUNITY): Payer: HMO

## 2021-09-22 DIAGNOSIS — M47816 Spondylosis without myelopathy or radiculopathy, lumbar region: Secondary | ICD-10-CM | POA: Diagnosis not present

## 2021-09-22 DIAGNOSIS — Z6832 Body mass index (BMI) 32.0-32.9, adult: Secondary | ICD-10-CM | POA: Diagnosis not present

## 2021-09-22 NOTE — Telephone Encounter (Signed)
Called patient to schedule f/u appt per Dr. Harrington Challenger, left detailed message to call in and get scheduled

## 2021-09-23 ENCOUNTER — Ambulatory Visit (HOSPITAL_COMMUNITY): Payer: HMO

## 2021-09-24 ENCOUNTER — Ambulatory Visit (HOSPITAL_COMMUNITY): Payer: HMO

## 2021-09-24 ENCOUNTER — Ambulatory Visit (HOSPITAL_COMMUNITY): Payer: HMO | Admitting: Psychiatry

## 2021-09-27 DIAGNOSIS — M542 Cervicalgia: Secondary | ICD-10-CM | POA: Diagnosis not present

## 2021-09-27 DIAGNOSIS — M797 Fibromyalgia: Secondary | ICD-10-CM | POA: Diagnosis not present

## 2021-09-27 DIAGNOSIS — G4733 Obstructive sleep apnea (adult) (pediatric): Secondary | ICD-10-CM | POA: Diagnosis not present

## 2021-09-27 DIAGNOSIS — M545 Low back pain, unspecified: Secondary | ICD-10-CM | POA: Diagnosis not present

## 2021-09-27 DIAGNOSIS — G5620 Lesion of ulnar nerve, unspecified upper limb: Secondary | ICD-10-CM | POA: Diagnosis not present

## 2021-09-27 DIAGNOSIS — M5412 Radiculopathy, cervical region: Secondary | ICD-10-CM | POA: Diagnosis not present

## 2021-09-27 DIAGNOSIS — M47896 Other spondylosis, lumbar region: Secondary | ICD-10-CM | POA: Diagnosis not present

## 2021-09-27 DIAGNOSIS — M5416 Radiculopathy, lumbar region: Secondary | ICD-10-CM | POA: Diagnosis not present

## 2021-09-27 DIAGNOSIS — G5603 Carpal tunnel syndrome, bilateral upper limbs: Secondary | ICD-10-CM | POA: Diagnosis not present

## 2021-09-28 ENCOUNTER — Other Ambulatory Visit (HOSPITAL_COMMUNITY): Payer: Self-pay | Admitting: Neurology

## 2021-09-28 ENCOUNTER — Other Ambulatory Visit: Payer: Self-pay | Admitting: Neurology

## 2021-09-28 DIAGNOSIS — M47896 Other spondylosis, lumbar region: Secondary | ICD-10-CM

## 2021-09-29 ENCOUNTER — Telehealth (HOSPITAL_COMMUNITY): Payer: HMO | Admitting: Psychiatry

## 2021-09-29 DIAGNOSIS — R053 Chronic cough: Secondary | ICD-10-CM | POA: Diagnosis not present

## 2021-09-29 DIAGNOSIS — U071 COVID-19: Secondary | ICD-10-CM | POA: Diagnosis not present

## 2021-09-30 ENCOUNTER — Other Ambulatory Visit: Payer: Self-pay

## 2021-09-30 ENCOUNTER — Encounter (HOSPITAL_COMMUNITY): Payer: Self-pay | Admitting: Psychiatry

## 2021-09-30 ENCOUNTER — Telehealth (INDEPENDENT_AMBULATORY_CARE_PROVIDER_SITE_OTHER): Payer: HMO | Admitting: Psychiatry

## 2021-09-30 DIAGNOSIS — F332 Major depressive disorder, recurrent severe without psychotic features: Secondary | ICD-10-CM

## 2021-09-30 DIAGNOSIS — F319 Bipolar disorder, unspecified: Secondary | ICD-10-CM

## 2021-09-30 MED ORDER — BUPROPION HCL ER (XL) 300 MG PO TB24
300.0000 mg | ORAL_TABLET | ORAL | 2 refills | Status: DC
Start: 2021-09-30 — End: 2021-10-26

## 2021-09-30 MED ORDER — FLUOXETINE HCL 40 MG PO CAPS: 40.0000 mg | ORAL_CAPSULE | Freq: Every day | ORAL | 2 refills | Status: DC

## 2021-09-30 MED ORDER — TRAZODONE HCL 50 MG PO TABS: 50.0000 mg | ORAL_TABLET | Freq: Every day | ORAL | 2 refills | Status: DC

## 2021-09-30 MED ORDER — CLONAZEPAM 1 MG PO TABS: 1.0000 mg | ORAL_TABLET | Freq: Two times a day (BID) | ORAL | 2 refills | Status: DC | PRN

## 2021-09-30 NOTE — Progress Notes (Signed)
Virtual Visit via Video Note  I connected with Michelle Clements on 09/30/21 at 10:40 AM EDT by a video enabled telemedicine application and verified that I am speaking with the correct person using two identifiers.  Location: Patient: home Provider: office   I discussed the limitations of evaluation and management by telemedicine and the availability of in person appointments. The patient expressed understanding and agreed to proceed.     I discussed the assessment and treatment plan with the patient. The patient was provided an opportunity to ask questions and all were answered. The patient agreed with the plan and demonstrated an understanding of the instructions.   The patient was advised to call back or seek an in-person evaluation if the symptoms worsen or if the condition fails to improve as anticipated.  I provided 25 minutes of non-face-to-face time during this encounter.   Michelle Spiller, MD  [facility] MD/PA/NP OP Progress Note  09/30/2021 11:02 AM Michelle Clements  MRN:  [id number]  Chief Complaint:  Chief Complaint   Depression; Anxiety; Follow-up    HPI: This patient is a 55year-old married white female who lives with her husband r in [place]. She has two daughters  who live out of the home. 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  She has finished her intensive outpatient program and really benefited form it.  She is going to try to get into some other support groups online and also is restarting her therapy with Maurice Small.  For the most part her mood has been stable although not quite as good as it was when she was attending the groups.  She is dealing with chronic pain in her extremities as well as neuropathy.  She is now on  tizanidine.  She does feel a bit foggy and I urged her not to combine the tizanidine with clonazepam as she has been doing.  She asked if she can go up some more on the Wellbutrin.  She had read  somewhere that this helps with nerve pain although I have never seen any evidence for this.  Since it will probably also help her depression I do not see anything wrong with trying this.  Since she is already also on Prozac I warned that it could cause some jitteriness.  In general her mood has been more stable and she denies any thoughts of self-harm or suicidal ideation.  Of note her whole family has COVID although she has not gotten it herself. Visit Diagnosis:    ICD-10-CM   1. Major depressive disorder, recurrent, severe without psychotic features ([place])  F33.2     2. Bipolar 1 disorder (HCC)  F31.9 clonazePAM (KLONOPIN) 1 MG tablet      Past Psychiatric History: Recent admission for overdose/suicide attempt, admitted for detox several years ago  Past Medical History:  Past Medical History:  Diagnosis Date   Allergic rhinitis    Anemia    Arthritis    Lower back and hips   Asthma    Bipolar disorder ([place])    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HCC)    Constipation    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HCC)    Depression    Essential hypertension, benign    Fibromyalgia    GERD (gastroesophageal reflux disease)    H/O sleep apnea    Hip pain, left    History of palpitations    Negative Holter monitor   History of  pneumonia 1988   Hyperlipidemia    IBS (irritable bowel syndrome)    Neck pain    Poor short term memory    PTSD (post-traumatic stress disorder)    S/P Botox injection 11/2014    For migraine headaches   Type 2 diabetes mellitus ([place])    Urgency of urination     Past Surgical History:  Procedure Laterality Date   CARPAL TUNNEL RELEASE Right 06/10/2013   Procedure: CARPAL TUNNEL RELEASE;  Surgeon: Faythe Ghee, MD;  Location: [place] [facility];  Service: Neurosurgery;  Laterality: Right;  Right Carpal Tunnel Release    CHOLECYSTECTOMY     DENTAL SURGERY  12/2014   mole exc     x 3   MUSCLE BIOPSY  2008   Right leg   TUBAL LIGATION      Family Psychiatric  History: see below  Family History:  Family History  Problem Relation Age of Onset   Fibromyalgia Mother    Diabetes type II Mother    Depression Mother    Alzheimer's disease Mother    GER disease Mother    Heart disease Mother    Paranoid behavior Mother    Dementia Mother    Heart attack Father 59       MI x5   Stroke Father        CVA x7   Asthma Father    Brain cancer Father    Seizures Father    Anxiety disorder Sister    OCD Sister    Sexual abuse Sister    Physical abuse Sister    Anxiety disorder Sister    ADD / ADHD Daughter    ADD / ADHD Daughter    Alcohol abuse Neg Hx    Drug abuse Neg Hx    Schizophrenia Neg Hx     Social History:  Social History   Socioeconomic History   Marital status: Married    Spouse name: Not on file   Number of children: 2   Years of education: College   Highest education level: Associate degree: academic program  Occupational History   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   Caffeine use: 2 cups coffee in morning and 2 cups tea during the day    Social Determinants of Health   Financial Resource Strain: Low Risk    Difficulty of Paying Living Expenses: Not very hard  Food Insecurity: No Food Insecurity   Worried About Charity fundraiser in the Last Year: Never true   Ran Out of Food in the Last Year: Never true  Transportation Needs: No Transportation Needs   Lack of Transportation  (Medical): No   Lack of Transportation (Non-Medical): No  Physical Activity: Inactive   Days of Exercise per Week: 0 days   Minutes of Exercise per Session: 0  min  Stress: No Stress Concern Present   Feeling of Stress : Not at all  Social Connections: Moderately Integrated   Frequency of Communication with Friends and Family: More than three times a week   Frequency of Social Gatherings with Friends and Family: More than three times a week   Attends Religious Services: More than 4 times per year   Active Member of [employer] or Organizations: No   Attends Archivist Meetings: Never   Marital Status: Married    Allergies:  Allergies  Allergen Reactions   Molds & Smuts Shortness Of Breath    wheezing   Other Anaphylaxis, Shortness Of Breath and Other (See Comments)     : Angioedema wheezing Wheezing wheezing Other reaction(s): Cough Wheezing Itching and rash Wheezing wheezing Wheezing wheezing Other reaction(s): Other (See Comments)  : Angioedema wheezing Wheezing wheezing Other reaction(s): Cough Wheezing Itching and rash Wheezing wheezing Other reaction(s): Other (See Comments) Wheezing wheezing  : Angioedema wheezing Wheezing wheezing Other reaction(s): Cough Wheezing Itching and rash Wheezing wheezing Wheezing wheezing   Statins Anaphylaxis   Sulfa Antibiotics Anaphylaxis     : Angioedema    Sulfonamide Derivatives Anaphylaxis     : Angioedema   Trichophyton Shortness Of Breath    _0  wheezing wheezing wheezing wheezing   Carbamazepine Rash   Dust Mite Extract Cough and Other (See Comments)    Wheezing Other reaction(s): Wheezing Wheezing Wheezing Other reaction(s): Wheezing Wheezing Wheezing Other reaction(s): Wheezing Wheezing   Gluten Meal Itching   Clonazepam Other (See Comments)    Confusion and memory loss Caused nconfusion    Metabolic Disorder Labs: Lab Results   Component Value Date   HGBA1C 5.7 08/25/2021   MPG 131 03/18/2015   No results found for: PROLACTIN Lab Results  Component Value Date   CHOL 196 08/06/2021   TRIG 144 08/06/2021   HDL 54 08/06/2021   CHOLHDL 4.2 03/18/2015   VLDL 44 (H) 03/18/2015   LDLCALC 117 08/06/2021   LDLCALC 64 12/29/2020   Lab Results  Component Value Date   TSH 2.69 12/29/2020   TSH 1.66 09/14/2020    Therapeutic Level Labs: Lab Results  Component Value Date   LITHIUM 0.20 (L) 02/21/2012   No results found for: VALPROATE No components found for:  CBMZ  Current Medications: Current Outpatient Medications  Medication Sig Dispense Refill   buPROPion (WELLBUTRIN XL) 300 MG 24 hr tablet Take 1 tablet (300 mg total) by mouth every morning. 30 tablet 2   albuterol (PROVENTIL HFA;VENTOLIN HFA) 108 (90 BASE) MCG/ACT inhaler Inhale 2 puffs into the lungs every 6 (six) hours as needed for wheezing or shortness of breath.     Ascorbic Acid (VITAMIN C) 1000 MG tablet Take 1,000 mg by mouth daily.     Blood Glucose Monitoring Suppl ([place]) w/Device KIT See admin instructions.     cetirizine (ZYRTEC) 10 MG tablet Take 10 mg by mouth at bedtime.     Cholecalciferol (VITAMIN D) 50 MCG (2000 UT) tablet Take 2,000 Units by mouth daily.     clonazePAM (KLONOPIN) 1 MG tablet Take 1 tablet (1 mg total) by mouth 2 (two) times daily as needed for anxiety. 60 tablet 2   estradiol (ESTRACE) 0.1 MG/GM vaginal cream Place vaginally.     FLUoxetine (PROZAC) 40 MG capsule Take 1 capsule (40 mg total) by mouth daily. 30 capsule 2   fluticasone (FLONASE) 50 MCG/ACT nasal spray Place 1 spray  into both nostrils daily.     Glucos-Chond-Hyal Ac-Ca Fructo (MOVE FREE JOINT HEALTH ADVANCE PO) Take by mouth.     ibuprofen (ADVIL) 800 MG tablet Take 800 mg by mouth 2 (two) times daily.     insulin glargine (LANTUS SOLOSTAR) 100 UNIT/ML Solostar Pen Inject 50 Units into the skin daily.     Lancets (ONETOUCH  ULTRASOFT) lancets 1 each by Other route as needed.      lubiprostone (AMITIZA) 24 MCG capsule TAKE ONE CAPSULE BY MOUTH EVERY MORNING and TAKE ONE CAPSULE BY MOUTH EVERY EVENING     Melatonin 5 MG TABS Take 5 mg by mouth at bedtime as needed.      Multiple Vitamin (MULTIVITAMIN) tablet Take 1 tablet by mouth daily.     naproxen sodium (ALEVE) 220 MG tablet Take 220 mg by mouth 2 (two) times daily as needed. (Patient not taking: Reported on 09/07/2021)     omeprazole (PRILOSEC) 20 MG capsule Take 20 mg by mouth daily.     ONETOUCH VERIO test strip 4 (four) times daily.     pregabalin (LYRICA) 200 MG capsule Take 200 mg by mouth 3 (three) times daily.     rosuvastatin (CRESTOR) 20 MG tablet Take 1 tablet (20 mg total) by mouth at bedtime. 90 tablet 3   sennosides-docusate sodium (SENOKOT-S) 8.6-50 MG tablet Take 1 tablet by mouth daily.     tiZANidine (ZANAFLEX) 4 MG tablet Take 4 mg by mouth daily. 12m at bedtime and 2 mg during the day     traZODone (DESYREL) 50 MG tablet Take 1 tablet (50 mg total) by mouth at bedtime. 30 tablet 2   vitamin B-12 (CYANOCOBALAMIN) 100 MCG tablet Take 100 mcg by mouth daily.     zinc gluconate 50 MG tablet Take 50 mg by mouth daily.     No current facility-administered medications for this visit.     Musculoskeletal: Strength & Muscle Tone: within normal limits Gait & Station: normal Patient leans: N/A  Psychiatric Specialty Exam: Review of Systems  Musculoskeletal:  Positive for arthralgias, back pain and myalgias.  Neurological:  Positive for numbness.  All other systems reviewed and are negative.  Last menstrual period 01/04/2018.There is no height or weight on file to calculate BMI.  General Appearance: Casual and Fairly Groomed  Eye Contact:  Good  Speech:  Clear and Coherent  Volume:  Normal  Mood:  Euthymic  Affect:  Appropriate and Congruent  Thought Process:  Goal Directed  Orientation:  Full (Time, Place, and Person)  Thought Content:  WDL   Suicidal Thoughts:  No  Homicidal Thoughts:  No  Memory:  Immediate;   Good Recent;   Good Remote;   Good  Judgement:  Good  Insight:  Fair  Psychomotor Activity:  Decreased  Concentration:  Concentration: Good and Attention Span: Good  Recall:  Good  Fund of Knowledge: Good  Language: Good  Akathisia:  No  Handed:  Right  AIMS (if indicated): not done  Assets:  Communication Skills Desire for Improvement Resilience Social Support Talents/Skills  ADL's:  Intact  Cognition: WNL  Sleep:  Good   Screenings: Mini-Mental    [place] [facility]  Total Score (max 30 points ) 27      PHQ2-9    Flowsheet Row Video Visit [place] 09/[place]20[place] from 09/15/2021 in [place] 09/14/2021 in [place]  Video Visit from 09/02/2021 in [place] Counselor from 08/30/2021 in [place]  PHQ-2 Total Score _0 0 2  PHQ-9 Total Score _1 Flowsheet Row Video Visit from 09/30/2021 in [place] Counselor from 09/15/2021 in [place] Video Visit from 09/02/2021 in [facility] Error: Q3, 4, or 5 should not be populated when Q2 is No Error: Question 6 not populated Error: Q3, 4, or 5 should not be populated when Q2 is No        Assessment and Plan: This patient is a 55 year old female with a history of PTSD depression anxiety hallucinations and nightmares.  She made a recent suicide attempt about 2 months ago.  She has benefited greatly from the intensive outpatient program.  She is sleeping better now that we have added trazodone 50 mg at bedtime so this will be continued.   At her request I will increase Wellbutrin XL to 300 mg daily and continue Prozac 40 mg daily for depression and she will also continue clonazepam 1 mg twice daily for anxiety.  She will return to see me in 4-week   Michelle Spiller, MD 09/30/2021, 11:02 AM

## 2021-10-03 ENCOUNTER — Other Ambulatory Visit (HOSPITAL_COMMUNITY): Payer: Self-pay | Admitting: Psychiatry

## 2021-10-04 DIAGNOSIS — K219 Gastro-esophageal reflux disease without esophagitis: Secondary | ICD-10-CM | POA: Diagnosis not present

## 2021-10-04 DIAGNOSIS — E1165 Type 2 diabetes mellitus with hyperglycemia: Secondary | ICD-10-CM | POA: Diagnosis not present

## 2021-10-04 NOTE — Psych (Signed)
Virtual Visit via Video Note  I connected with Michelle Clements on 09/06/21 at  9:00 AM EDT by a video enabled telemedicine application and verified that I am speaking with the correct person using two identifiers.  Location: Patient: Home Provider: Clinical Home Office   I discussed the limitations of evaluation and management by telemedicine and the availability of in person appointments. The patient expressed understanding and agreed to proceed.  I discussed the assessment and treatment plan with the patient. The patient was provided an opportunity to ask questions and all were answered. The patient agreed with the plan and demonstrated an understanding of the instructions.   The patient was advised to call back or seek an in-person evaluation if the symptoms worsen or if the condition fails to improve as anticipated.  Pt was provided 240 minutes of non-face-to-face time during this encounter.   Lorin Glass, LCSW    [facility] PHP THERAPIST PROGRESS NOTE  Michelle Clements [id number]  Session Time: 9:00 - 10:00  Participation Level: Active  Behavioral Response: CasualAlertAnxious and Depressed  Type of Therapy: Group Therapy  Treatment Goals addressed: Coping  Interventions: CBT, DBT, Solution Focused, Strength-based, Supportive, and Reframing  Summary: Clinician led check-in regarding current stressors and situation. Clinician utilized active listening and empathetic response and validated patient emotions. Clinician facilitated processing group on pertinent issues.  Therapist Response: Michelle Clements is a 55 y.o. female who presents with depression and anxiety symptoms. Patient arrived within time allowed and reports that she is feeling "okay." Patient rates her mood at a 7 on a scale of 1-10 with 10 being great. Pt reports her weekend was a "struggle" and she had a "windfall of emotions." Pt states continued grief with her mother's death and trying to go through her things. Pt states  panic attacks. Pt able to process. Pt engaged in discussion.            Session Time: 10:00 - 11:00   Participation Level: Active   Behavioral Response: CasualAlertDepressed   Type of Therapy: Group Therapy   Treatment Goals addressed: Coping   Interventions: CBT, DBT, Supportive and Reframing   Summary: Cln introduced topic of stress management and the model of the "4 A's of stress management:" avoid, alter, accept, and adapt. Group members worked through Advice worker and discussed barriers to utilizing the 4 A's for stressors.     Therapist Response: Pt engaged in discussion and reports understanding of how to utilize the 4 A's.            Session Time: 11:00- 12:00   Participation Level: Active   Behavioral Response: CasualAlertDepressed   Type of Therapy: Group Therapy, OT   Treatment Goals addressed: Coping   Interventions: Psychosocial skills training, Supportive   Summary: Occupational Therapy group   Therapist Response: Patient engaged in group when prompted. See OT note.         Session Time: 12:00 -1:00   Participation Level: Active   Behavioral Response: CasualAlertDepressed   Type of Therapy: Group therapy   Treatment Goals addressed: Coping   Interventions: CBT; Solution focused; Supportive; Reframing   Summary: 12:00 - 12:50: Cln introduced topic of boundaries. Cln discussed how boundaries inform our relationships and affect self-esteem and personal agency. Group discussed the three types of boundaries: rigid, porous, and healthy and when each type is most helpful/harmful.  12:50 -1:00 Clinician led check-out. Clinician assessed for immediate needs, medication compliance and efficacy, and safety concerns   Therapist Response: 12:00 -  12:50: Pt engaged in discussion and reports having mostly porous boundaries.  12:50 - 1:00: At check-out, patient rates her mood at a 8 on a scale of 1-10 with 10 being great. Pt reports afternoon plans of doing  chores. Pt demonstrates some progress as evidenced by utilizing skills when overwhelmed. Patient denies SI/HI at the end of group.   Suicidal/Homicidal: Nowithout intent/plan  Plan: Pt will continue in PHP while working to decrease depression and anxiety symptoms, increase emotional regulation, and increase ability to manage symptoms in a healthy manner.   Diagnosis: Severe episode of recurrent major depressive disorder, without psychotic features ([place]) [F33.2]    1. Severe episode of recurrent major depressive disorder, without psychotic features ([place])       Lorin Glass, LCSW

## 2021-10-06 ENCOUNTER — Encounter (HOSPITAL_COMMUNITY): Payer: Self-pay | Admitting: Psychiatry

## 2021-10-06 ENCOUNTER — Telehealth (INDEPENDENT_AMBULATORY_CARE_PROVIDER_SITE_OTHER): Payer: HMO | Admitting: Psychiatry

## 2021-10-06 ENCOUNTER — Other Ambulatory Visit: Payer: Self-pay

## 2021-10-06 DIAGNOSIS — F332 Major depressive disorder, recurrent severe without psychotic features: Secondary | ICD-10-CM

## 2021-10-06 DIAGNOSIS — F319 Bipolar disorder, unspecified: Secondary | ICD-10-CM

## 2021-10-06 NOTE — Psych (Signed)
Virtual Visit via Video Note  I connected with Michelle Clements on 09/07/21 at  9:00 AM EDT by a video enabled telemedicine application and verified that I am speaking with the correct person using two identifiers.  Location: Patient: Home Provider: Clinical Home Office   I discussed the limitations of evaluation and management by telemedicine and the availability of in person appointments. The patient expressed understanding and agreed to proceed.  I discussed the assessment and treatment plan with the patient. The patient was provided an opportunity to ask questions and all were answered. The patient agreed with the plan and demonstrated an understanding of the instructions.   The patient was advised to call back or seek an in-person evaluation if the symptoms worsen or if the condition fails to improve as anticipated.  Pt was provided 240 minutes of non-face-to-face time during this encounter.   Lorin Glass, LCSW    [facility] PHP THERAPIST PROGRESS NOTE  Michelle Clements [id number]  Session Time: 9:00 - 10:00  Participation Level: Active  Behavioral Response: CasualAlertAnxious and Depressed  Type of Therapy: Group Therapy  Treatment Goals addressed: Coping  Interventions: CBT, DBT, Solution Focused, Strength-based, Supportive, and Reframing  Summary: Clinician led check-in regarding current stressors and situation. Clinician utilized active listening and empathetic response and validated patient emotions. Clinician facilitated processing group on pertinent issues.  Therapist Response: Michelle Clements is a 55 y.o. female who presents with depression and anxiety symptoms. Patient arrived within time allowed and reports that she is feeling "pretty good." Patient rates her mood at a 8 on a scale of 1-10 with 10 being great. Pt reports she had a relaxing afternoon and slept well. Pt reports stress regarding the relationship with her daughter. Pt able to process. Pt engaged in  discussion.            Session Time: 10:00 - 11:00   Participation Level: Active   Behavioral Response: CasualAlertDepressed   Type of Therapy: Group Therapy   Treatment Goals addressed: Coping   Interventions: CBT, DBT, Supportive and Reframing   Summary: Cln led discussion on ways to reinforce new habits. Group shared ways in which they can set themselves up for good habits. Cln encouraged reminders in phone, post-it notes, educating support system, and practicing in low-stress environments.     Therapist Response:  Pt engaged in discussion and is able to determine ways to reinforce positive habits.            Session Time: 11:00- 12:00   Participation Level: Active   Behavioral Response: CasualAlertDepressed   Type of Therapy: Group Therapy, OT   Treatment Goals addressed: Coping   Interventions: Psychosocial skills training, Supportive   Summary: Occupational Therapy group   Therapist Response: Patient engaged in group when prompted. See OT note.         Session Time: 12:00 -1:00   Participation Level: Active   Behavioral Response: CasualAlertDepressed   Type of Therapy: Group therapy   Treatment Goals addressed: Coping   Interventions: CBT; Solution focused; Supportive; Reframing   Summary: 12:00 - 12:50: Cln continued topic of boundaries. Cln discussed the different ways boundaries present: physical, emotional, intellectual, sexual, material, and time. Group talked about the ways in which each type presents for them and is a struggle.   12:50 -1:00 Clinician led check-out. Clinician assessed for immediate needs, medication compliance and efficacy, and safety concerns   Therapist Response: 12:00 - 12:50:  Pt engaged in discussion and reports most issue  with emotional and intellectual boundaries. 12:50 - 1:00: At check-out, patient rates her mood at a 8 on a scale of 1-10 with 10 being great. Pt reports afternoon plans of a medical appointment. Pt  demonstrates some progress as evidenced by seeking prosocial activities. Patient denies SI/HI at the end of group.   Suicidal/Homicidal: Nowithout intent/plan  Plan: Pt will continue in PHP while working to decrease depression and anxiety symptoms, increase emotional regulation, and increase ability to manage symptoms in a healthy manner.   Diagnosis: Severe episode of recurrent major depressive disorder, without psychotic features ([place]) [F33.2]    1. Severe episode of recurrent major depressive disorder, without psychotic features ([place])       Lorin Glass, LCSW

## 2021-10-06 NOTE — Progress Notes (Signed)
Virtual Visit via Video Note  I connected with Michelle Clements on 10/06/21 at  1:20 PM EDT by a video enabled telemedicine application and verified that I am speaking with the correct person using two identifiers.  Location: Patient: home Provider: office   I discussed the limitations of evaluation and management by telemedicine and the availability of in person appointments. The patient expressed understanding and agreed to proceed.      I discussed the assessment and treatment plan with the patient. The patient was provided an opportunity to ask questions and all were answered. The patient agreed with the plan and demonstrated an understanding of the instructions.   The patient was advised to call back or seek an in-person evaluation if the symptoms worsen or if the condition fails to improve as anticipated.  I provided 20 minutes of non-face-to-face time during this encounter.   [name], MD  [facility] MD/PA/NP OP Progress Note  10/06/2021 1:43 PM Michelle Clements  MRN:  [id number]  Chief Complaint:  Chief Complaint   Anxiety; Depression; Follow-up    HPI: This patient is a 55year-old married white female who lives with her husband in [place]. She has two daughters  who live out of the home. 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  She asked to be seen today because she had concerns about her medication.  She states that she feels foggy and off balance and sometimes feels like she is going to fall.  She has problems with short-term memory.  I noted that she is on numerous sedating medications including clonazepam tizanidine trazodone and Lyrica.  I urged her to cut the clonazepam and tizanidine in half and she is going to try this.  She has not noticed much change since we increased the Wellbutrin last week.  She is also having a nerve conduction study since she has been having the balance issues.  Right now her mood is fairly  stable and she denies serious depression suicidal ideation difficulty sleeping or severe anxiety. Visit Diagnosis:    ICD-10-CM   1. Bipolar 1 disorder (HCC)  F31.9     2. 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[place])    Depression    Essential hypertension, benign    Fibromyalgia    GERD (gastroesophageal reflux disease)    H/O sleep apnea    Hip pain, left    History of palpitations    Negative Holter monitor   History of pneumonia 1988   Hyperlipidemia    IBS (irritable bowel syndrome)    Neck pain    Poor short term memory    PTSD (post-traumatic stress disorder)    S/P Botox injection 11/2014    For migraine headaches   Type 2 diabetes mellitus ([place])    Urgency of urination     Past Surgical History:  Procedure Laterality Date   CARPAL TUNNEL RELEASE Right 06/10/2013   Procedure: CARPAL TUNNEL RELEASE;  Surgeon: Faythe Ghee, MD;  Location: [facility];  Service: Neurosurgery;  Laterality: Right;  Right Carpal Tunnel Release    CHOLECYSTECTOMY     DENTAL SURGERY  12/2014   mole exc     x 3   MUSCLE BIOPSY  2008   Right leg   TUBAL LIGATION      Family Psychiatric History: see below  Family History:  Family History  Problem Relation Age of Onset   Fibromyalgia Mother    Diabetes type II Mother    Depression Mother    Alzheimer's disease Mother    GER disease Mother    Heart disease Mother    Paranoid behavior Mother    Dementia Mother    Heart attack Father 54       MI x5   Stroke Father        CVA x7   Asthma Father    Brain  cancer Father    Seizures Father    Anxiety disorder Sister    OCD Sister    Sexual abuse Sister    Physical abuse Sister    Anxiety disorder Sister    ADD / ADHD Daughter    ADD / ADHD Daughter    Alcohol abuse Neg Hx    Drug abuse Neg Hx    Schizophrenia Neg Hx     Social History:  Social History   Socioeconomic History   Marital status: Married    Spouse name: Not on file   Number of children: 2   Years of education: College   Highest education level: Associate degree: academic program  Occupational History   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   Caffeine use: 2 cups coffee in morning and 2 cups tea during the day    Social Determinants of Health   Financial Resource Strain: Low Risk    Difficulty of Paying Living Expenses: Not very hard  Food Insecurity: No Food Insecurity   Worried About Charity fundraiser in the Last Year: Never true   Ran Out of Food in the Last Year: Never true  Transportation Needs: No Transportation Needs   Lack of Transportation (Medical): No   Lack of Transportation (Non-Medical): No  Physical Activity: Inactive   Days of Exercise per Week: 0 days   Minutes of Exercise per Session: 0 min  Stress: No Stress Concern Present   Feeling of Stress : Not at all  Social Connections: Moderately Integrated   Frequency of Communication with Friends and Family: More than three times a week   Frequency of Social  Gatherings with Friends and Family: More than three times a week   Attends Religious Services: More than 4 times per year   Active Member of [employer] or Organizations: No   Attends Archivist Meetings: Never  Marital Status: Married    Allergies:  Allergies  Allergen Reactions   Molds & Smuts Shortness Of Breath    wheezing   Other Anaphylaxis, Shortness Of Breath and Other (See Comments)     : Angioedema wheezing Wheezing wheezing Other reaction(s): Cough Wheezing Itching and rash Wheezing wheezing Wheezing wheezing Other reaction(s): Other (See Comments)  : Angioedema wheezing Wheezing wheezing Other reaction(s): Cough Wheezing Itching and rash Wheezing wheezing Other reaction(s): Other (See Comments) Wheezing wheezing  : Angioedema wheezing Wheezing wheezing Other reaction(s): Cough Wheezing Itching and rash Wheezing wheezing Wheezing wheezing   Statins Anaphylaxis   Sulfa Antibiotics Anaphylaxis     : Angioedema    Sulfonamide Derivatives Anaphylaxis     : Angioedema   Trichophyton Shortness Of Breath    wheezing wheezing wheezing wheezing wheezing wheezing wheezing wheezing wheezing   Carbamazepine Rash   Dust Mite Extract Cough and Other (See Comments)    Wheezing Other reaction(s): Wheezing Wheezing Wheezing Other reaction(s): Wheezing Wheezing Wheezing Other reaction(s): Wheezing Wheezing   Gluten Meal Itching   Clonazepam Other (See Comments)    Confusion and memory loss Caused nconfusion    Metabolic Disorder Labs: Lab Results  Component Value Date   HGBA1C 5.7 08/25/2021   MPG 131 03/18/2015   No results found for: PROLACTIN Lab Results  Component Value Date   CHOL 196 08/06/2021   TRIG 144 08/06/2021   HDL 54 08/06/2021   CHOLHDL 4.2 03/18/2015   VLDL 44 (H) 03/18/2015   LDLCALC 117 08/06/2021   LDLCALC 64 12/29/2020   Lab Results  Component Value Date   TSH 2.69 12/29/2020   TSH 1.66 09/14/2020     Therapeutic Level Labs: Lab Results  Component Value Date   LITHIUM 0.20 (L) 02/21/2012   No results found for: VALPROATE No components found for:  CBMZ  Current Medications: Current Outpatient Medications  Medication Sig Dispense Refill   albuterol (PROVENTIL HFA;VENTOLIN HFA) 108 (90 BASE) MCG/ACT inhaler Inhale 2 puffs into the lungs every 6 (six) hours as needed for wheezing or shortness of breath.     Ascorbic Acid (VITAMIN C) 1000 MG tablet Take 1,000 mg by mouth daily.     Blood Glucose Monitoring Suppl ([place]) w/Device KIT See admin instructions.     buPROPion (WELLBUTRIN XL) 300 MG 24 hr tablet Take 1 tablet (300 mg total) by mouth every morning. 30 tablet 2   cetirizine (ZYRTEC) 10 MG tablet Take 10 mg by mouth at bedtime.     Cholecalciferol (VITAMIN D) 50 MCG (2000 UT) tablet Take 2,000 Units by mouth daily.     clonazePAM (KLONOPIN) 1 MG tablet Take 1 tablet (1 mg total) by mouth 2 (two) times daily as needed for anxiety. 60 tablet 2   estradiol (ESTRACE) 0.1 MG/GM vaginal cream Place vaginally.     FLUoxetine (PROZAC) 40 MG capsule Take 1 capsule (40 mg total) by mouth daily. 30 capsule 2   fluticasone (FLONASE) 50 MCG/ACT nasal spray Place 1 spray into both nostrils daily.     Glucos-Chond-Hyal Ac-Ca Fructo (MOVE FREE JOINT HEALTH ADVANCE PO) Take by mouth.     ibuprofen (ADVIL) 800 MG tablet Take 800 mg by mouth 2 (two) times daily.     insulin glargine (LANTUS SOLOSTAR) 100 UNIT/ML Solostar Pen Inject 50 Units into the skin daily.     Lancets (ONETOUCH ULTRASOFT) lancets 1 each by Other route as needed.      lubiprostone (AMITIZA) 24 MCG capsule  TAKE ONE CAPSULE BY MOUTH EVERY MORNING and TAKE ONE CAPSULE BY MOUTH EVERY EVENING     Melatonin 5 MG TABS Take 5 mg by mouth at bedtime as needed.      Multiple Vitamin (MULTIVITAMIN) tablet Take 1 tablet by mouth daily.     naproxen sodium (ALEVE) 220 MG tablet Take 220 mg by mouth 2 (two) times daily  as needed. (Patient not taking: Reported on 09/07/2021)     omeprazole (PRILOSEC) 20 MG capsule Take 20 mg by mouth daily.     ONETOUCH VERIO test strip 4 (four) times daily.     pregabalin (LYRICA) 200 MG capsule Take 200 mg by mouth 3 (three) times daily.     rosuvastatin (CRESTOR) 20 MG tablet Take 1 tablet (20 mg total) by mouth at bedtime. 90 tablet 3   sennosides-docusate sodium (SENOKOT-S) 8.6-50 MG tablet Take 1 tablet by mouth daily.     tiZANidine (ZANAFLEX) 4 MG tablet Take 4 mg by mouth daily. 33m at bedtime and 2 mg during the day     traZODone (DESYREL) 50 MG tablet Take 1 tablet (50 mg total) by mouth at bedtime. 30 tablet 2   vitamin B-12 (CYANOCOBALAMIN) 100 MCG tablet Take 100 mcg by mouth daily.     zinc gluconate 50 MG tablet Take 50 mg by mouth daily.     No current facility-administered medications for this visit.     Musculoskeletal: Strength & Muscle Tone: within normal limits Gait & Station: unsteady Patient leans: N/A  Psychiatric Specialty Exam: Review of Systems  Musculoskeletal:  Positive for arthralgias, back pain and gait problem.  Neurological:  Positive for light-headedness.  All other systems reviewed and are negative.  Last menstrual period 01/04/2018.There is no height or weight on file to calculate BMI.  General Appearance: Casual and Fairly Groomed  Eye Contact:  Good  Speech:  Clear and Coherent  Volume:  Normal  Mood:  Euthymic  Affect:  Appropriate and Congruent  Thought Process:  Goal Directed  Orientation:  Full (Time, Place, and Person)  Thought Content: WDL   Suicidal Thoughts:  No  Homicidal Thoughts:  No  Memory:  Immediate;   Fair Recent;   Fair Remote;   Fair  Judgement:  Good  Insight:  Fair  Psychomotor Activity:  Decreased  Concentration:  Concentration: Fair and Attention Span: Fair  Recall:  FAES Corporationof Knowledge: Good  Language: Good  Akathisia:  No  Handed:  Right  AIMS (if indicated): not done  Assets:   Communication Skills Desire for Improvement Resilience Social Support Talents/Skills  ADL's:  Intact  Cognition: WNL  Sleep:  Good   Screenings: Mini-Mental    Flowsheet Row [facility]  Total Score (max 30 points ) 27      PHQ2-9    Flowsheet Row Video Visit [facility]20[facility] [facility]2021 [facility] from 09/14/2021 [facility]2021 [facility]  PHQ-2 Total Score 0 3 1 1  0  PHQ-9 Total Score -- 8 6 6 4       Flowsheet Row Video Visit [facility] 10/06/2021 [facility]/[place]/2021 [facility] from 09/15/2021 in [facility]: Q3, 4, or  5 should not be populated when Q2 is No Error: Q3, 4, or 5 should not be populated when Q2 is No Error: Question 6 not populated        Assessment and Plan: This patient is a 55 year old female with history of PTSD depression anxiety hallucinations and nightmares.  Now she is feeling lightheaded dizzy and forgetful.  Since she is on several sedating medications I encouraged her to cut the clonazepam down to 0.5 mg twice daily, cut the tizanidine in half and even try cutting the trazodone from 50 to 25 mg at bedtime.  She will continue Wellbutrin XL 300 mg daily and Prozac 40 mg daily for depression.  She will return to see me in 3 weeks but call sooner if symptoms recur.   [name], MD 10/06/2021, 1:43 PM

## 2021-10-07 DIAGNOSIS — D801 Nonfamilial hypogammaglobulinemia: Secondary | ICD-10-CM | POA: Diagnosis not present

## 2021-10-08 ENCOUNTER — Ambulatory Visit (HOSPITAL_COMMUNITY): Payer: HMO | Admitting: Psychiatry

## 2021-10-12 NOTE — Psych (Signed)
Virtual Visit via Video Note  I connected with Michelle Clements on 09/08/21 at  9:00 AM EDT by a video enabled telemedicine application and verified that I am speaking with the correct person using two identifiers.  Location: Patient: Home Provider: Clinical Home Office   I discussed the limitations of evaluation and management by telemedicine and the availability of in person appointments. The patient expressed understanding and agreed to proceed.   I discussed the assessment and treatment plan with the patient. The patient was provided an opportunity to ask questions and all were answered. The patient agreed with the plan and demonstrated an understanding of the instructions.   The patient was advised to call back or seek an in-person evaluation if the symptoms worsen or if the condition fails to improve as anticipated.  Pt was provided 240 minutes of non-face-to-face time during this encounter.   Lorin Glass, LCSW    [facility] PHP THERAPIST PROGRESS NOTE  Michelle Clements [id number]  Session Time: 9-10  Participation Level: Active  Behavioral Response: CasualAlertAnxious and Depressed  Type of Therapy: Group Therapy  Treatment Goals addressed: Coping  Interventions: CBT, DBT, Solution Focused, Strength-based, Supportive, and Reframing  Summary: Clinician led check-in regarding current stressors and situation. Clinician utilized active listening and empathetic response and validated patient emotions. Clinician facilitated processing group on pertinent issues.  Therapist Response: Michelle Clements is a 55 y.o. female who presents with depression and anxiety symptoms. Patient arrived within time allowed and reports that she is feeling "okay." Patient rates her mood at an 6 on a scale of 1-10 with 10 being great. Pt reports she had a neck injection yesterday and is feeling physically sore and ill today. Pt states spending time in public yesterday and beng able to manage anxiety due to her  family being with her. Pt reports struggling with self-esteem. Pt able to process. Pt engaged in discussion.        Session Time: 10:00 - 11:00   Participation Level: Active   Behavioral Response: CasualAlertDepressed   Type of Therapy: Group Therapy   Treatment Goals addressed: Coping   Interventions: CBT, DBT, Supportive and Reframing   Summary: Cln led discussion on decision making and how to apply logic to fears. Group viewed TED talk "Why you should define your fears not your goals" to aid discussion. Group discussed ways to consider how we can address fears and make them manageable.     Therapist Response: Pt engaged in discussion and practices decision making model with group.  fears not goals TED talk        Session Time: 11:00 -12:00   Participation Level: Active   Behavioral Response: CasualAlertDepressed   Type of Therapy: Group therapy   Treatment Goals addressed: Coping   Interventions: Supportive   Summary: Chaplaincy group   Therapist Response: Pt engaged in discussion. See note   Session Time: 12:00- 1:00   Participation Level: Active   Behavioral Response: CasualAlertDepressed   Type of Therapy: Group Therapy, OT   Treatment Goals addressed: Coping   Interventions: Psychosocial skills training, Supportive   Summary: 12:00-12:50: Occupational Therapy group 12:50 -1:00 Clinician led check-out. Clinician assessed for immediate needs, medication compliance and efficacy, and safety concerns  Therapist Response: 12:00-12:50 Patient engaged in group. See OT note.   12:50 - 1:00: At check-out, patient rates her mood at a 7.5 on a scale of 1-10 with 10 being great. Pt reports afternoon plans of taking a nap. Pt demonstrates some progress  as evidenced by leaving the house. Patient denies SI/HI at the end of group.   Suicidal/Homicidal: Nowithout intent/plan  Plan: Pt will continue in PHP while working to decrease depression and anxiety symptoms,  increase emotional regulation, and increase ability to manage symptoms in a healthy manner.   Diagnosis: Severe episode of recurrent major depressive disorder, without psychotic features ([place]) [F33.2]    1. Severe episode of recurrent major depressive disorder, without psychotic features ([place])      Lorin Glass, LCSW

## 2021-10-13 DIAGNOSIS — F319 Bipolar disorder, unspecified: Secondary | ICD-10-CM | POA: Diagnosis not present

## 2021-10-13 DIAGNOSIS — N643 Galactorrhea not associated with childbirth: Secondary | ICD-10-CM | POA: Insufficient documentation

## 2021-10-13 DIAGNOSIS — R519 Headache, unspecified: Secondary | ICD-10-CM | POA: Diagnosis not present

## 2021-10-13 DIAGNOSIS — E039 Hypothyroidism, unspecified: Secondary | ICD-10-CM | POA: Diagnosis not present

## 2021-10-13 DIAGNOSIS — D839 Common variable immunodeficiency, unspecified: Secondary | ICD-10-CM | POA: Diagnosis not present

## 2021-10-14 ENCOUNTER — Ambulatory Visit (HOSPITAL_COMMUNITY)
Admission: RE | Admit: 2021-10-14 | Discharge: 2021-10-14 | Disposition: A | Discharge status: Home / Self Care | Payer: HMO | Source: Ambulatory Visit | Attending: Neurology | Admitting: Neurology

## 2021-10-14 ENCOUNTER — Other Ambulatory Visit (HOSPITAL_COMMUNITY): Payer: Self-pay | Admitting: Internal Medicine

## 2021-10-14 ENCOUNTER — Other Ambulatory Visit: Payer: Self-pay

## 2021-10-14 DIAGNOSIS — M47896 Other spondylosis, lumbar region: Secondary | ICD-10-CM | POA: Insufficient documentation

## 2021-10-14 DIAGNOSIS — M545 Low back pain, unspecified: Secondary | ICD-10-CM | POA: Diagnosis not present

## 2021-10-14 DIAGNOSIS — Z1231 Encounter for screening mammogram for malignant neoplasm of breast: Secondary | ICD-10-CM

## 2021-10-22 ENCOUNTER — Ambulatory Visit (INDEPENDENT_AMBULATORY_CARE_PROVIDER_SITE_OTHER): Payer: HMO | Admitting: Psychiatry

## 2021-10-22 ENCOUNTER — Other Ambulatory Visit: Payer: Self-pay

## 2021-10-22 DIAGNOSIS — F319 Bipolar disorder, unspecified: Secondary | ICD-10-CM

## 2021-10-22 NOTE — Progress Notes (Deleted)
Virtual Visit via Video Note  I connected with Michelle Clements on 10/22/21 at  9:00 AM EST by a video enabled telemedicine application and verified that I am speaking with the correct person using two identifiers.  Location: Patient: *** Provider: ***   I discussed the limitations of evaluation and management by telemedicine and the availability of in person appointments. The patient expressed understanding and agreed to proceed.  History of Present Illness:    Observations/Objective:   Assessment and Plan:   Follow Up Instructions:    I discussed the assessment and treatment plan with the patient. The patient was provided an opportunity to ask questions and all were answered. The patient agreed with the plan and demonstrated an understanding of the instructions.   The patient was advised to call back or seek an in-person evaluation if the symptoms worsen or if the condition fails to improve as anticipated.  I provided *** minutes of non-face-to-face time during this encounter.   Sahas Sluka E Samanatha Brammer, LCSW  .
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
## 2021-10-25 NOTE — Psych (Signed)
Virtual Visit via Video Note  I connected with Michelle Clements on 09/09/21 at  9:00 AM EDT by a video enabled telemedicine application and verified that I am speaking with the correct person using two identifiers.  Location: Patient: Home Provider: Clinical Home Office   I discussed the limitations of evaluation and management by telemedicine and the availability of in person appointments. The patient expressed understanding and agreed to proceed.  I discussed the assessment and treatment plan with the patient. The patient was provided an opportunity to ask questions and all were answered. The patient agreed with the plan and demonstrated an understanding of the instructions.   The patient was advised to call back or seek an in-person evaluation if the symptoms worsen or if the condition fails to improve as anticipated.  Pt was provided 240 minutes of non-face-to-face time during this encounter.   Lorin Glass, LCSW    [facility] PHP THERAPIST PROGRESS NOTE  Michelle Clements [id number]  Session Time: 9:00 - 10:00  Participation Level: Active  Behavioral Response: CasualAlertAnxious and Depressed  Type of Therapy: Group Therapy  Treatment Goals addressed: Coping  Interventions: CBT, DBT, Solution Focused, Strength-based, Supportive, and Reframing  Summary: Clinician led check-in regarding current stressors and situation. Clinician utilized active listening and empathetic response and validated patient emotions. Clinician facilitated processing group on pertinent issues.  Therapist Response: Michelle Clements is a 55 y.o. female who presents with depression and anxiety symptoms. Patient arrived within time allowed and reports that she is feeling "good." Patient rates her mood at a 9 on a scale of 1-10 with 10 being great. Pt reports she is having decreased trauma reactions. Pt states her husband reports noticing positive change in pt. Pt continues to struggle with energy and anxiety. Pt able  to process. Pt engaged in discussion.            Session Time: 10:00 - 11:00   Participation Level: Active   Behavioral Response: CasualAlertDepressed   Type of Therapy: Group Therapy   Treatment Goals addressed: Coping   Interventions: CBT, DBT, Supportive and Reframing   Summary: Cln led discussion on staying present. Cln highlighted CBT distorted thoughts of catastrophizing and fortune telling and encouraged pt's to think about today and tomorrow and not past that to address those distortions. Group members shared struggles they have with looking into the future.     Therapist Response: Pt is able to process and discuss how to apply strategies discussed.            Session Time: 11:00- 12:00   Participation Level: Active   Behavioral Response: CasualAlertDepressed   Type of Therapy: Group Therapy, OT   Treatment Goals addressed: Coping   Interventions: Psychosocial skills training, Supportive   Summary: Occupational Therapy group   Therapist Response: Patient engaged in group when prompted. See OT note.         Session Time: 12:00 -1:00   Participation Level: Active   Behavioral Response: CasualAlertDepressed   Type of Therapy: Group therapy   Treatment Goals addressed: Coping   Interventions: CBT; Solution focused; Supportive; Reframing   Summary: 12:00 - 12:50: Cln continued topic of boundaries and introduced how to set and maintain healthy boundaries. Cln utilized handout "how to set boundaries" and group members worked through examples to practice setting appropriate boundaries.  12:50 -1:00 Clinician led check-out. Clinician assessed for immediate needs, medication compliance and efficacy, and safety concerns   Therapist Response: 12:00 - 12:50:  Pt engaged  in discussion and reports struggle with maintaining boundaries.  12:50 - 1:00: At check-out, patient rates her mood at a 8 on a scale of 1-10 with 10 being great. Pt reports afternoon plans of  taking a nap. Pt demonstrates some progress as evidenced by increased ability to challenge negative thinking. Patient denies SI/HI at the end of group.   Suicidal/Homicidal: Nowithout intent/plan  Plan: Pt will continue in PHP while working to decrease depression and anxiety symptoms, increase emotional regulation, and increase ability to manage symptoms in a healthy manner.   Diagnosis: Severe episode of recurrent major depressive disorder, without psychotic features ([place]) [F33.2]    1. Severe episode of recurrent major depressive disorder, without psychotic features ([place])       Lorin Glass, LCSW

## 2021-10-26 ENCOUNTER — Telehealth (INDEPENDENT_AMBULATORY_CARE_PROVIDER_SITE_OTHER): Payer: HMO | Admitting: Psychiatry

## 2021-10-26 ENCOUNTER — Other Ambulatory Visit: Payer: Self-pay

## 2021-10-26 ENCOUNTER — Encounter (HOSPITAL_COMMUNITY): Payer: Self-pay | Admitting: Psychiatry

## 2021-10-26 ENCOUNTER — Other Ambulatory Visit (HOSPITAL_COMMUNITY): Payer: Self-pay | Admitting: *Deleted

## 2021-10-26 DIAGNOSIS — F332 Major depressive disorder, recurrent severe without psychotic features: Secondary | ICD-10-CM

## 2021-10-26 DIAGNOSIS — F319 Bipolar disorder, unspecified: Secondary | ICD-10-CM

## 2021-10-26 DIAGNOSIS — M47816 Spondylosis without myelopathy or radiculopathy, lumbar region: Secondary | ICD-10-CM | POA: Diagnosis not present

## 2021-10-26 DIAGNOSIS — D801 Nonfamilial hypogammaglobulinemia: Secondary | ICD-10-CM

## 2021-10-26 MED ORDER — TRAZODONE HCL 50 MG PO TABS: 50.0000 mg | ORAL_TABLET | Freq: Every day | ORAL | 2 refills | Status: DC

## 2021-10-26 MED ORDER — CLONAZEPAM 0.5 MG PO TABS: 0.5000 mg | ORAL_TABLET | Freq: Two times a day (BID) | ORAL | 2 refills | Status: DC

## 2021-10-26 MED ORDER — BUPROPION HCL ER (XL) 300 MG PO TB24
300.0000 mg | ORAL_TABLET | ORAL | 2 refills | Status: DC
Start: 2021-10-26 — End: 2021-11-22

## 2021-10-26 MED ORDER — FLUOXETINE HCL 40 MG PO CAPS: 40.0000 mg | ORAL_CAPSULE | Freq: Every day | ORAL | 2 refills | Status: DC

## 2021-10-26 NOTE — Psych (Signed)
Virtual Visit via Video Note  I connected with Michelle Clements on 09/13/21 at  9:00 AM EDT by a video enabled telemedicine application and verified that I am speaking with the correct person using two identifiers.  Location: Patient: Home Provider: Clinical Home Office   I discussed the limitations of evaluation and management by telemedicine and the availability of in person appointments. The patient expressed understanding and agreed to proceed.  I discussed the assessment and treatment plan with the patient. The patient was provided an opportunity to ask questions and all were answered. The patient agreed with the plan and demonstrated an understanding of the instructions.   The patient was advised to call back or seek an in-person evaluation if the symptoms worsen or if the condition fails to improve as anticipated.  Pt was provided 240 minutes of non-face-to-face time during this encounter.   Lorin Glass, LCSW    [facility] PHP THERAPIST PROGRESS NOTE  SHEREE LALLA [id number]  Session Time: 9:00 - 10:00  Participation Level: Active  Behavioral Response: CasualAlertAnxious and Depressed  Type of Therapy: Group Therapy  Treatment Goals addressed: Coping  Interventions: CBT, DBT, Solution Focused, Strength-based, Supportive, and Reframing  Summary: Clinician led check-in regarding current stressors and situation. Clinician utilized active listening and empathetic response and validated patient emotions. Clinician facilitated processing group on pertinent issues.  Therapist Response: ANASTASIJA ANFINSON is a 55 y.o. female who presents with depression and anxiety symptoms. Patient arrived within time allowed and reports that she is feeling "pretty good." Patient rates her mood at a 8 on a scale of 1-10 with 10 being great. Pt reports she had muscle spasms last night that interfered with her sleep. Pt states going to social events over the weekend and noticing decreased anxiety while out  and about. Pt struggles to stay awake during group. Pt able to process. Pt engaged in discussion.            Session Time: 10:00 - 11:00   Participation Level: Active   Behavioral Response: CasualAlertDepressed   Type of Therapy: Group Therapy   Treatment Goals addressed: Coping   Interventions: CBT, DBT, Supportive and Reframing   Summary: Cln led discussion on DBT dialectics and balance. Cln encouraged pt's to utilize AND statements to cue their brain to hold two opposing ideas. Cln provided examples and group members created their own AND statements.    Therapist Response:  Pt engaged in discussion and created AND statement around recognzing progress.            Session Time: 11:00- 12:00   Participation Level: Active   Behavioral Response: CasualAlertDepressed   Type of Therapy: Group Therapy, OT   Treatment Goals addressed: Coping   Interventions: Psychosocial skills training, Supportive   Summary: Occupational Therapy group   Therapist Response: Patient engaged in group when prompted. See OT note.          Session Time: 12:00 -1:00   Participation Level: Active   Behavioral Response: CasualAlertDepressed   Type of Therapy: Group therapy   Treatment Goals addressed: Coping   Interventions: CBT; Solution focused; Supportive; Reframing   Summary: 12:00 - 12:50: Cln led discussion on "work arounds" or finding intermediate solutions while we are in the process of working on a final solution. Group members shared situations in which they are struggling with an issue that they are unable to fix quickly. Cln encouraged pt's to consider ways to make things "doable" and not let "perfect" stand in  the way.  12:50 -1:00 Clinician led check-out. Clinician assessed for immediate needs, medication compliance and efficacy, and safety concerns   Therapist Response: 12:00 - 12:50: Pt engaged in discussion and is able to connect with discussion. 12:50 - 1:00: At  check-out, patient rates her mood at a 7 on a scale of 1-10 with 10 being great. Pt reports afternoon plans of resting and chores. Pt demonstrates some progress as evidenced by increased ability to work through negative feelings. Patient denies SI/HI at the end of group.   Suicidal/Homicidal: Nowithout intent/plan  Plan: Pt will continue in PHP while working to decrease depression and anxiety symptoms, increase emotional regulation, and increase ability to manage symptoms in a healthy manner.   Diagnosis: Severe episode of recurrent major depressive disorder, without psychotic features ([place]) [F33.2]    1. Severe episode of recurrent major depressive disorder, without psychotic features ([place])       Lorin Glass, LCSW

## 2021-10-26 NOTE — Progress Notes (Signed)
Virtual Visit via Telephone Note  I connected with Michelle Clements on 10/26/21 at 10:20 AM EST by telephone and verified that I am speaking with the correct person using two identifiers.  Location: Patient: home Provider: office   I discussed the limitations, risks, security and privacy concerns of performing an evaluation and management service by telephone and the availability of in person appointments. I also discussed with the patient that there may be a patient responsible charge related to this service. The patient expressed understanding and agreed to proceed.      I discussed the assessment and treatment plan with the patient. The patient was provided an opportunity to ask questions and all were answered. The patient agreed with the plan and demonstrated an understanding of the instructions.   The patient was advised to call back or seek an in-person evaluation if the symptoms worsen or if the condition fails to improve as anticipated.  I provided 14 minutes of non-face-to-face time during this encounter.   [name], MD  [facility] MD/PA/NP OP Progress Note  10/26/2021 10:53 AM Michelle Clements  MRN:  [id number]  Chief Complaint:  Chief Complaint   Depression; Anxiety; Follow-up    HPI: This patient is a 55year-old married white female who lives with her husband in [place]. She has two daughters  who live out of the home. 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  Last time she states that she felt oversedated and I strongly encouraged her to cut down some of the sedating medications.  She has cut down clonazepam to 0.5 mg only at bedtime as well as trazodone 25 mg at bedtime.  She is feeling much more awake and alert.  Since I last saw her she found out that her older daughter was diagnosed with neuro sarcoma.  They are unsure of what this entails and whether or not she is going to need chemotherapy or other forms of  treatment.  She is trying to stay upbeat and positive for her daughter.  She denies significant depression or suicidal ideation and feels that her mood has been stable and her anxiety is under good control right now.  She continues to deal with a lot of back pain as well as fibromyalgia. Visit Diagnosis:    ICD-10-CM   1. Bipolar 1 disorder (HCC)  F31.9     2. Major depressive disorder, recurrent, severe without psychotic features ([place])  F33.2       Past Psychiatric History: Recent admission for overdose/suicide attempt followed by partial hospital program.  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[place])    Depression    Essential hypertension, benign    Fibromyalgia    GERD (gastroesophageal reflux disease)    H/O sleep apnea    Hip pain, left    History of palpitations    Negative Holter monitor   History of pneumonia 1988   Hyperlipidemia    IBS (irritable bowel syndrome)    Neck pain    Poor short term memory    PTSD (post-traumatic stress disorder)    S/P  Botox injection 11/2014    For migraine headaches   Type 2 diabetes mellitus ([place])    Urgency of urination     Past Surgical History:  Procedure Laterality Date   CARPAL TUNNEL RELEASE Right 06/10/2013   Procedure: CARPAL TUNNEL RELEASE;  Surgeon: Faythe Ghee, MD;  Location: [place] [facility];  Service: Neurosurgery;  Laterality: Right;  Right Carpal Tunnel Release    CHOLECYSTECTOMY     DENTAL SURGERY  12/2014   mole exc     x 3   MUSCLE BIOPSY  2008   Right leg   TUBAL LIGATION      Family Psychiatric History: see below  Family History:  Family History  Problem Relation Age of Onset   Fibromyalgia Mother    Diabetes type II Mother     Depression Mother    Alzheimer's disease Mother    GER disease Mother    Heart disease Mother    Paranoid behavior Mother    Dementia Mother    Heart attack Father 20       MI x5   Stroke Father        CVA x7   Asthma Father    Brain cancer Father    Seizures Father    Anxiety disorder Sister    OCD Sister    Sexual abuse Sister    Physical abuse Sister    Anxiety disorder Sister    ADD / ADHD Daughter    ADD / ADHD Daughter    Alcohol abuse Neg Hx    Drug abuse Neg Hx    Schizophrenia Neg Hx     Social History:  Social History   Socioeconomic History   Marital status: Married    Spouse name: Not on file   Number of children: 2   Years of education: College   Highest education level: Associate degree: academic program  Occupational History   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   Caffeine use: 2 cups coffee in morning and 2 cups tea during the day    Social Determinants of Health   Financial Resource Strain: Low Risk    Difficulty of Paying Living Expenses: Not very hard  Food Insecurity: No Food Insecurity   Worried About Charity fundraiser in the Last Year: Never true   Ran Out of Food in the Last Year: Never true  Transportation Needs: No Transportation Needs   Lack of Transportation (Medical): No   Lack of Transportation (Non-Medical): No  Physical Activity: Inactive   Days of Exercise per Week: 0 days   Minutes of  Exercise per Session: 0 min  Stress: No Stress Concern Present   Feeling of Stress : Not at all  Social Connections: Moderately Integrated   Frequency of Communication with Friends and Family: More than three times a  week   Frequency of Social Gatherings with Friends and Family: More than three times a week   Attends Religious Services: More than 4 times per year   Active Member of [employer] or Organizations: No   Attends Archivist Meetings: Never   Marital Status: Married    Allergies:  Allergies  Allergen Reactions   Molds & Smuts Shortness Of Breath    wheezing   Other Anaphylaxis, Shortness Of Breath and Other (See Comments)     : Angioedema wheezing Wheezing wheezing Other reaction(s): Cough Wheezing Itching and rash Wheezing wheezing Wheezing wheezing Other reaction(s): Other (See Comments)  : Angioedema wheezing Wheezing wheezing Other reaction(s): Cough Wheezing Itching and rash Wheezing wheezing Other reaction(s): Other (See Comments) Wheezing wheezing  : Angioedema wheezing Wheezing wheezing Other reaction(s): Cough Wheezing Itching and rash Wheezing wheezing Wheezing wheezing   Statins Anaphylaxis   Sulfa Antibiotics Anaphylaxis     : Angioedema    Sulfonamide Derivatives Anaphylaxis     : Angioedema   Trichophyton Shortness Of Breath    _0  wheezing wheezing wheezing wheezing   Carbamazepine Rash   Dust Mite Extract Cough and Other (See Comments)    Wheezing Other reaction(s): Wheezing Wheezing Wheezing Other reaction(s): Wheezing Wheezing Wheezing Other reaction(s): Wheezing Wheezing   Gluten Meal Itching   Clonazepam Other (See Comments)    Confusion and memory loss Caused nconfusion    Metabolic Disorder Labs: Lab Results  Component Value Date   HGBA1C 5.7 08/25/2021   MPG 131 03/18/2015   No results found for: PROLACTIN Lab Results  Component Value Date   CHOL  196 08/06/2021   TRIG 144 08/06/2021   HDL 54 08/06/2021   CHOLHDL 4.2 03/18/2015   VLDL 44 (H) 03/18/2015   LDLCALC 117 08/06/2021   LDLCALC 64 12/29/2020   Lab Results  Component Value Date   TSH 2.69 12/29/2020   TSH 1.66 09/14/2020    Therapeutic Level Labs: Lab Results  Component Value Date   LITHIUM 0.20 (L) 02/21/2012   No results found for: VALPROATE No components found for:  CBMZ  Current Medications: Current Outpatient Medications  Medication Sig Dispense Refill   clonazePAM (KLONOPIN) 0.5 MG tablet Take 1 tablet (0.5 mg total) by mouth 2 (two) times daily. 60 tablet 2   albuterol (PROVENTIL HFA;VENTOLIN HFA) 108 (90 BASE) MCG/ACT inhaler Inhale 2 puffs into the lungs every 6 (six) hours as needed for wheezing or shortness of breath.     Ascorbic Acid (VITAMIN C) 1000 MG tablet Take 1,000 mg by mouth daily.     Blood Glucose Monitoring Suppl ([place]) w/Device KIT See admin instructions.     buPROPion (WELLBUTRIN XL) 300 MG 24 hr tablet Take 1 tablet (300 mg total) by mouth every morning. 30 tablet 2   cetirizine (ZYRTEC) 10 MG tablet Take 10 mg by mouth at bedtime.     Cholecalciferol (VITAMIN D) 50 MCG (2000 UT) tablet Take 2,000 Units by mouth daily.     estradiol (ESTRACE) 0.1 MG/GM vaginal cream Place vaginally.     FLUoxetine (PROZAC) 40 MG capsule Take 1 capsule (40 mg total) by mouth daily. 30 capsule 2   fluticasone (FLONASE) 50 MCG/ACT nasal spray Place 1 spray into both nostrils daily.     Glucos-Chond-Hyal Ac-Ca Fructo (MOVE FREE JOINT HEALTH ADVANCE PO) Take by mouth.     ibuprofen (ADVIL) 800 MG tablet Take 800 mg by mouth 2 (two) times daily.  insulin glargine (LANTUS SOLOSTAR) 100 UNIT/ML Solostar Pen Inject 50 Units into the skin daily.     Lancets (ONETOUCH ULTRASOFT) lancets 1 each by Other route as needed.      lubiprostone (AMITIZA) 24 MCG capsule TAKE ONE CAPSULE BY MOUTH EVERY MORNING and TAKE ONE CAPSULE BY MOUTH EVERY  EVENING     Melatonin 5 MG TABS Take 5 mg by mouth at bedtime as needed.      Multiple Vitamin (MULTIVITAMIN) tablet Take 1 tablet by mouth daily.     naproxen sodium (ALEVE) 220 MG tablet Take 220 mg by mouth 2 (two) times daily as needed. (Patient not taking: Reported on 09/07/2021)     omeprazole (PRILOSEC) 20 MG capsule Take 20 mg by mouth daily.     ONETOUCH VERIO test strip 4 (four) times daily.     pregabalin (LYRICA) 200 MG capsule Take 200 mg by mouth 3 (three) times daily.     rosuvastatin (CRESTOR) 20 MG tablet Take 1 tablet (20 mg total) by mouth at bedtime. 90 tablet 3   sennosides-docusate sodium (SENOKOT-S) 8.6-50 MG tablet Take 1 tablet by mouth daily.     tiZANidine (ZANAFLEX) 4 MG tablet Take 4 mg by mouth daily. 78m at bedtime and 2 mg during the day     traZODone (DESYREL) 50 MG tablet Take 1 tablet (50 mg total) by mouth at bedtime. 30 tablet 2   vitamin B-12 (CYANOCOBALAMIN) 100 MCG tablet Take 100 mcg by mouth daily.     zinc gluconate 50 MG tablet Take 50 mg by mouth daily.     No current facility-administered medications for this visit.     Musculoskeletal: Strength & Muscle Tone: decreased Gait & Station: normal Patient leans: N/A  Psychiatric Specialty Exam: Review of Systems  Musculoskeletal:  Positive for back pain, myalgias and neck pain.  All other systems reviewed and are negative.  Last menstrual period 01/04/2018.There is no height or weight on file to calculate BMI.  General Appearance: Casual and Fairly Groomed  Eye Contact:  Good  Speech:  Clear and Coherent  Volume:  Normal  Mood:  Euthymic  Affect:  Appropriate and Congruent  Thought Process:  Goal Directed  Orientation:  Full (Time, Place, and Person)  Thought Content: Rumination   Suicidal Thoughts:  No  Homicidal Thoughts:  No  Memory:  Immediate;   Good Recent;   Good Remote;   Fair  Judgement:  Good  Insight:  Good  Psychomotor Activity:  Decreased  Concentration:  Concentration:  Good and Attention Span: Good  Recall:  Good  Fund of Knowledge: Good  Language: Good  Akathisia:  No  Handed:  Right  AIMS (if indicated): not done  Assets:  Communication Skills Desire for Improvement Resilience Social Support Talents/Skills  ADL's:  Intact  Cognition: WNL  Sleep:  Good   Screenings: Mini-Mental    Flowsheet Row [facility]  Total Score (max 30 points ) 27      PHQ2-9    Flowsheet Row Video [facility]/26/2021 [facility] 09/14/2021 in [facility] PHQ-2 Total Score 0 0 _0 PHQ-9 Total Score -- -- _1 Flowsheet Row Video Visit from 10/26/2021  in [facility] Video Visit from 10/06/2021 in [facility] Video Visit from 09/30/2021 in [place] Error: Q3, 4, or 5 should not be populated when Q2 is No Error: Q3, 4, or 5 should not be populated when Q2 is No Error: Q3, 4, or 5 should not be populated when Q2 is No        Assessment and Plan: This patient is a 55 year old female with a history of PTSD depression anxiety hallucinations and nightmares.  She is feeling less dizzy and lightheaded since she cut down some of the sedating medications.  She will continue clonazepam 0.5 mg at bedtime, trazodone 25 mg at bedtime for sleep, Wellbutrin XL 300 mg daily and Prozac 40 mg daily for depression.  She will return to see me in 4 weeks   [name], MD 10/26/2021, 10:53 AM

## 2021-10-27 ENCOUNTER — Ambulatory Visit (HOSPITAL_COMMUNITY): Payer: HMO | Admitting: Hematology

## 2021-11-01 ENCOUNTER — Other Ambulatory Visit: Payer: Self-pay

## 2021-11-01 ENCOUNTER — Ambulatory Visit (HOSPITAL_COMMUNITY)
Admission: RE | Admit: 2021-11-01 | Discharge: 2021-11-01 | Disposition: A | Discharge status: Home / Self Care | Payer: HMO | Source: Ambulatory Visit | Attending: Internal Medicine | Admitting: Internal Medicine

## 2021-11-01 DIAGNOSIS — Z1231 Encounter for screening mammogram for malignant neoplasm of breast: Secondary | ICD-10-CM | POA: Diagnosis not present

## 2021-11-01 DIAGNOSIS — D801 Nonfamilial hypogammaglobulinemia: Secondary | ICD-10-CM | POA: Diagnosis not present

## 2021-11-02 DIAGNOSIS — N952 Postmenopausal atrophic vaginitis: Secondary | ICD-10-CM | POA: Diagnosis not present

## 2021-11-03 DIAGNOSIS — E782 Mixed hyperlipidemia: Secondary | ICD-10-CM | POA: Diagnosis not present

## 2021-11-03 DIAGNOSIS — I129 Hypertensive chronic kidney disease with stage 1 through stage 4 chronic kidney disease, or unspecified chronic kidney disease: Secondary | ICD-10-CM | POA: Diagnosis not present

## 2021-11-03 DIAGNOSIS — E1165 Type 2 diabetes mellitus with hyperglycemia: Secondary | ICD-10-CM | POA: Diagnosis not present

## 2021-11-03 DIAGNOSIS — N1831 Chronic kidney disease, stage 3a: Secondary | ICD-10-CM | POA: Diagnosis not present

## 2021-11-03 NOTE — Progress Notes (Signed)
[facility] [address] [place] [address]   CLINIC:  Medical Oncology/Hematology  PCP:  [name], MD [place] [address] [phone]   REASON FOR VISIT:  Follow-up for hypogammaglobulinemia  CURRENT THERAPY: IVIG  INTERVAL HISTORY:  Michelle Clements 55 y.o. 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  She was last seen by Dr. Lorenso Courier on 07/27/2021.  She started her IVIG on 10/07/2021.  She is due for her next dose on 11/09/2021.  At today's visit, she reports feeling fair.  She has not required antibiotic treatment of infections within the past 3 months, but she does continue to have sinus pressure and drainage, and requests referral to ENT today.  She has not had any issues with skin rashes.  No pneumonia or changes in respiratory status.  She does continue to have intermittent diarrhea from her IBS but denies any other GI symptoms or upset.  No changes in her bowel or bladder habits.  No B symptoms such as fever, chills, night sweats, unintentional weight loss.  She has 60% energy and 100% appetite. She endorses that she is maintaining a stable weight.   REVIEW OF SYSTEMS:  Review of Systems  Constitutional:  Positive for fatigue. Negative for appetite change, chills, diaphoresis, fever and unexpected weight change.  HENT:   Positive for trouble swallowing. Negative for lump/mass and nosebleeds.   Eyes:  Negative for eye problems.  Respiratory:  Positive for shortness of breath (Sometimes feels that she "cannot get a full breath"). Negative for cough and hemoptysis.   Cardiovascular:  Negative for chest pain, leg swelling and palpitations.  Gastrointestinal:  Positive for constipation and diarrhea. Negative for abdominal pain, blood in stool, nausea and vomiting.  Genitourinary:  Negative for hematuria.   Skin: Negative.   Neurological:  Negative for dizziness, headaches and light-headedness.  Hematological:  Does  not bruise/bleed easily.  Psychiatric/Behavioral:  Positive for depression. The patient is nervous/anxious.      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[place])    Depression    Essential hypertension, benign    Fibromyalgia    GERD (gastroesophageal reflux disease)    H/O sleep apnea    Hip pain, left    History of palpitations    Negative Holter monitor   History of pneumonia 1988   Hyperlipidemia    IBS (irritable bowel syndrome)    Neck pain    Poor short term memory    PTSD (post-traumatic stress disorder)    S/P Botox injection 11/2014    For migraine headaches   Type 2 diabetes mellitus ([place])    Urgency of urination    Past Surgical History:  Procedure Laterality Date   CARPAL TUNNEL RELEASE Right 06/10/2013   Procedure: CARPAL TUNNEL RELEASE;  Surgeon: Faythe Ghee, MD;  Location: [facility];  Service: Neurosurgery;  Laterality: Right;  Right Carpal Tunnel Release    CHOLECYSTECTOMY     DENTAL SURGERY  12/2014   mole exc     x 3   MUSCLE BIOPSY  2008   Right leg   TUBAL LIGATION       SOCIAL HISTORY:  Social  History   Socioeconomic History   Marital status: Married    Spouse name: Not on file   Number of children: 2   Years of education: College   Highest education level: Associate degree: academic program  Occupational History   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   Caffeine use: 2 cups coffee in morning and 2 cups tea during the day    Social Determinants of Health   Financial Resource Strain: Low Risk    Difficulty of Paying Living Expenses: Not very hard  Food Insecurity: No Food Insecurity   Worried About Charity fundraiser in the Last Year: Never true   Ran Out of Food in the Last Year: Never true  Transportation Needs: No Transportation Needs   Lack of Transportation (Medical): No   Lack of Transportation (Non-Medical): No  Physical Activity: Inactive   Days of Exercise per Week: 0 days   Minutes of Exercise per Session: 0 min  Stress: No Stress Concern Present   Feeling of Stress : Not at all  Social Connections: Moderately Integrated   Frequency of Communication with Friends and Family: More than three times a week   Frequency of Social Gatherings with Friends and Family: More than three times a week   Attends Religious Services: More than 4 times per year   Active Member of [employer] or Organizations: No   Attends Music therapist: Never   Marital Status: Married  Human resources officer Violence: Not At Risk   Fear of Current or Ex-Partner: No   Emotionally Abused: No   Physically Abused: No   Sexually Abused: No    FAMILY HISTORY:  Family History  Problem Relation Age of Onset   Fibromyalgia Mother    Diabetes type II Mother    Depression Mother    Alzheimer's disease Mother    GER disease Mother    Heart disease Mother    Paranoid behavior Mother    Dementia Mother    Heart attack Father 2       MI x5   Stroke Father        CVA x7   Asthma Father    Brain cancer Father    Seizures Father    Anxiety disorder Sister    OCD Sister    Sexual abuse Sister    Physical abuse Sister    Anxiety disorder Sister    ADD / ADHD  Daughter    ADD / ADHD Daughter    Alcohol abuse Neg Hx    Drug abuse Neg Hx    Schizophrenia Neg Hx     CURRENT MEDICATIONS:  Outpatient Encounter Medications as of 11/04/2021  Medication Sig Note   albuterol (PROVENTIL HFA;VENTOLIN HFA) 108 (90 BASE) MCG/ACT inhaler Inhale 2 puffs into the lungs every 6 (six) hours as needed  for wheezing or shortness of breath.    Ascorbic Acid (VITAMIN C) 1000 MG tablet Take 1,000 mg by mouth daily.    Blood Glucose Monitoring Suppl ([place]) w/Device KIT See admin instructions.    buPROPion (WELLBUTRIN XL) 300 MG 24 hr tablet Take 1 tablet (300 mg total) by mouth every morning.    cetirizine (ZYRTEC) 10 MG tablet Take 10 mg by mouth at bedtime.    Cholecalciferol (VITAMIN D) 50 MCG (2000 UT) tablet Take 2,000 Units by mouth daily.    clonazePAM (KLONOPIN) 0.5 MG tablet Take 1 tablet (0.5 mg total) by mouth 2 (two) times daily.    estradiol (ESTRACE) 0.1 MG/GM vaginal cream Place vaginally.    FLUoxetine (PROZAC) 40 MG capsule Take 1 capsule (40 mg total) by mouth daily.    fluticasone (FLONASE) 50 MCG/ACT nasal spray Place 1 spray into both nostrils daily.    Glucos-Chond-Hyal Ac-Ca Fructo (MOVE FREE JOINT HEALTH ADVANCE PO) Take by mouth.    ibuprofen (ADVIL) 800 MG tablet Take 800 mg by mouth 2 (two) times daily.    insulin glargine (LANTUS SOLOSTAR) 100 UNIT/ML Solostar Pen Inject 50 Units into the skin daily.    Lancets (ONETOUCH ULTRASOFT) lancets 1 each by Other route as needed.  04/25/2014: Received from: External Pharmacy   lubiprostone (AMITIZA) 24 MCG capsule TAKE ONE CAPSULE BY MOUTH EVERY MORNING and TAKE ONE CAPSULE BY MOUTH EVERY EVENING    Melatonin 5 MG TABS Take 5 mg by mouth at bedtime as needed.     Multiple Vitamin (MULTIVITAMIN) tablet Take 1 tablet by mouth daily.    naproxen sodium (ALEVE) 220 MG tablet Take 220 mg by mouth 2 (two) times daily as needed. (Patient not taking: Reported on 09/07/2021)    omeprazole  (PRILOSEC) 20 MG capsule Take 20 mg by mouth daily.    ONETOUCH VERIO test strip 4 (four) times daily.    pregabalin (LYRICA) 200 MG capsule Take 200 mg by mouth 3 (three) times daily.    rosuvastatin (CRESTOR) 20 MG tablet Take 1 tablet (20 mg total) by mouth at bedtime.    sennosides-docusate sodium (SENOKOT-S) 8.6-50 MG tablet Take 1 tablet by mouth daily.    tiZANidine (ZANAFLEX) 4 MG tablet Take 4 mg by mouth daily. 73m at bedtime and 2 mg during the day 03/12/2015: Received from: External Pharmacy Received Sig:    traZODone (DESYREL) 50 MG tablet Take 1 tablet (50 mg total) by mouth at bedtime.    vitamin B-12 (CYANOCOBALAMIN) 100 MCG tablet Take 100 mcg by mouth daily.    zinc gluconate 50 MG tablet Take 50 mg by mouth daily.    No facility-administered encounter medications on file as of 11/04/2021.    ALLERGIES:  Allergies  Allergen Reactions   Molds & Smuts Shortness Of Breath    wheezing   Other Anaphylaxis, Shortness Of Breath and Other (See Comments)     : Angioedema wheezing Wheezing wheezing Other reaction(s): Cough Wheezing Itching and rash Wheezing wheezing Wheezing wheezing Other reaction(s): Other (See Comments)  : Angioedema wheezing Wheezing wheezing Other reaction(s): Cough Wheezing Itching and rash Wheezing wheezing Other reaction(s): Other (See Comments) Wheezing wheezing  : Angioedema wheezing Wheezing wheezing Other reaction(s): Cough Wheezing Itching and rash Wheezing wheezing Wheezing wheezing   Statins Anaphylaxis   Sulfa Antibiotics Anaphylaxis     : Angioedema    Sulfonamide Derivatives Anaphylaxis     : Angioedema   Trichophyton Shortness Of Breath  wheezing wheezing wheezing wheezing wheezing wheezing wheezing wheezing wheezing   Carbamazepine Rash   Dust Mite Extract Cough and Other (See Comments)    Wheezing Other reaction(s): Wheezing Wheezing Wheezing Other reaction(s): Wheezing Wheezing Wheezing Other  reaction(s): Wheezing Wheezing   Gluten Meal Itching   Clonazepam Other (See Comments)    Confusion and memory loss Caused nconfusion     PHYSICAL EXAM:  ECOG PERFORMANCE STATUS: 1 - Symptomatic but completely ambulatory  There were no vitals filed for this visit. There were no vitals filed for this visit. Physical Exam Constitutional:      Appearance: Normal appearance. She is obese.  HENT:     Head: Normocephalic and atraumatic.     Nose:     Comments: Tenderness over maxillary sinuses.    Mouth/Throat:     Mouth: Mucous membranes are moist.  Eyes:     Extraocular Movements: Extraocular movements intact.     Pupils: Pupils are equal, round, and reactive to light.  Cardiovascular:     Rate and Rhythm: Normal rate and regular rhythm.     Pulses: Normal pulses.     Heart sounds: Normal heart sounds.  Pulmonary:     Effort: Pulmonary effort is normal.     Breath sounds: Normal breath sounds.  Abdominal:     General: Bowel sounds are normal.     Palpations: Abdomen is soft.     Tenderness: There is no abdominal tenderness.  Musculoskeletal:        General: No swelling.     Right lower leg: No edema.     Left lower leg: No edema.  Lymphadenopathy:     Cervical: No cervical adenopathy.  Skin:    General: Skin is warm and dry.  Neurological:     General: No focal deficit present.     Mental Status: She is alert and oriented to person, place, and time.  Psychiatric:        Mood and Affect: Mood normal.        Behavior: Behavior normal.     LABORATORY DATA:  I have reviewed the labs as listed.  CBC    Component Value Date/Time   WBC 9.0 07/03/2019 1230   RBC 4.64 07/03/2019 1230   HGB 14.7 07/03/2019 1230   HCT 42.8 07/03/2019 1230   PLT 288 07/03/2019 1230   MCV 92.2 07/03/2019 1230   MCH 31.7 07/03/2019 1230   MCHC 34.3 07/03/2019 1230   RDW 12.4 07/03/2019 1230   LYMPHSABS 2.2 07/03/2019 1230   MONOABS 0.5 07/03/2019 1230   EOSABS 0.3 07/03/2019 1230    BASOSABS 0.1 07/03/2019 1230   CMP Latest Ref Rng & Units 08/06/2021 04/14/2021 12/29/2020  Glucose 70 - 99 mg/dL - - -  BUN 4 - 21 16 27(A) 19  Creatinine 0.5 - 1.1 1.4(A) 1.4(A) 1.4(A)  Sodium 137 - 147 143 142 141  Potassium 3.4 - 5.3 5.0 4.0 4.2  Chloride 99 - 108 102 100 102  CO2 13 - 22 26(A) 27(A) 27(A)  Calcium 8.7 - 10.7 10.8(A) 10.0 10.2  Total Protein 6.5 - 8.1 g/dL - - -  Total Bilirubin 0.3 - 1.2 mg/dL - - -  Alkaline Phos 25 - 125 78 66 61  AST 13 - 35 28 56(A) 23  ALT 7 - 35 28 108(A) 26    DIAGNOSTIC IMAGING:  I have independently reviewed the relevant imaging and discussed with the patient.  ASSESSMENT & PLAN: 1.  Hypogammaglobulinemia with recurrent  infections - Labs from October 2015 showed IgG 566 [phone]), IgM 28 (57-237), IgA 65, IgE 12 - Evaluated by [facility] by Dr. Lloyd Huger (10/27/2014), who noted: " A review of Ms. Haverstick immune evaluation reveals mild hypogammaglobulinemia (not significant), a low IgM but normal specific antibody titers, normal B cell subsets panels and normal cellular function. Taken together the immune evaluation is normal. I do not believe Ms. Man has a functional disorder of her immune system. For the hypo IgM this may become significant if the IgM was less that 15. For this I suggest she have an annual IgG, IgM and IgA. Currently the IgM level is not likely to be significant". - She previously received IVIG, but this was discontinued in August 2016 due to financial reasons - She was previously seen by Dr. Delton Coombes at our clinic, but was lost to follow-up after her appointment on 07/10/2019. - She returned to reestablish care and saw Dr. Lorenso Courier on 07/27/2021. 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 Lorenso Courier in August 2022 - She reports that  she has been tolerating her IVIG infusions well, no reported adverse events or systemic reactions. - She started her IVIG on 10/07/2021, and she is due for her next dose on 11/09/2021 - Over the last 3 months, she has not required antibiotics for infections, but continues to have symptoms of sinusitis - No B symptoms such as fever, chills, night sweats, unintentional weight loss  - Most recent labs, sent by PCP (08/05/2021): CMP with creatinine 1.38 (baseline CKD stage III over the past year), mildly elevated calcium 10.8 likely due to elevated albumin 5.0 (corrected calcium 10.0).  CBC unremarkable. - Immunoglobulins were checked on 11/01/2021, and show improved IgG 723, although this was not checked during the official trough of her last treatment.  IgA remains low at 39, with IgM 13. - Patient receives her home IVIG infusions via [facility] (contact [place] Sandy at [phone], fax [phone], email [email or web address]) - PLAN: Continue IVIG q. 28 days (via [facility] home health infusion) with every 77-monthclinic visit follow-up. - We will check quantitative immunoglobulins 1 to 2 days before her IVIG at the end of January/early February.  Therapeutic goal is IgG around 600. - Referral sent to ENT for chronic sinus issues  2.  Other history - Her past medical history is otherwise notable for fatty liver disease with history of transaminitis, GERD, fibromyalgia, type 2 diabetes mellitus, and IBS   PLAN SUMMARY & DISPOSITION: Check labs in about 2 months (1 to 2 days before her IVIG that will be due around 01/04/2022) ENT referral for chronic sinusitis Office visit in 3 months  All questions were answered. The patient knows to call the clinic with any problems, questions or concerns.  Medical decision making: Moderate  Time spent on visit: I spent 20 minutes counseling the patient face to face. The total time spent in the appointment was 30 minutes and more than 50% was on  counseling.   RHarriett Rush PA-C  11/04/2021 4:54 PM

## 2021-11-04 ENCOUNTER — Other Ambulatory Visit: Payer: Self-pay

## 2021-11-04 ENCOUNTER — Inpatient Hospital Stay (HOSPITAL_COMMUNITY): Payer: HMO | Attending: Hematology | Admitting: Physician Assistant

## 2021-11-04 VITALS — BP 141/81 | HR 83 | Temp 98.4°F | Resp 18 | Wt 229.2 lb

## 2021-11-04 DIAGNOSIS — Z87891 Personal history of nicotine dependence: Secondary | ICD-10-CM | POA: Diagnosis not present

## 2021-11-04 DIAGNOSIS — J329 Chronic sinusitis, unspecified: Secondary | ICD-10-CM | POA: Diagnosis not present

## 2021-11-04 DIAGNOSIS — D801 Nonfamilial hypogammaglobulinemia: Secondary | ICD-10-CM | POA: Diagnosis not present

## 2021-11-04 NOTE — Patient Instructions (Signed)
[place] at [facility] Discharge Instructions  You were seen today by Tarri Abernethy PA-C for your immune deficiency (hypogammaglobulinemia) and recurrent infections.  Due to your ongoing sinus issues, we will refer you to ENT.  We will continue IVIG infusions once per month via your home health agency.  We will check your labs again in about 2 months to see how your immunoglobulin levels look. - Your next IVIG is 11/09/2021 - The following IVIG will likely be in early January (around 12/07/2021), which means that the next-next IVIG will be around 01/04/2021. - You will need to go to Dr. Juel Burrow office to get your labs checked 1 to 2 days BEFORE the IVIG infusion in late January/early February. - Please ask Dr. Juel Burrow office to fax these results to [place] as soon as they are received.   LABS: Labs from Dr. Juel Burrow office during the last week of January. - Quantitative immunoglobulins - CBC - CMP  MEDICATIONS: Continue IVIG every 4 weeks  FOLLOW-UP APPOINTMENT: Office visit in 3 months   Thank you for choosing [facility] at [facility] to provide your oncology and hematology care.  To afford each patient quality time with our provider, please arrive at least 15 minutes before your scheduled appointment time.   If you have a lab appointment with the [facility] please come in thru the Main Entrance and check in at the main information desk.  You need to re-schedule your appointment should you arrive 10 or more minutes late.  We strive to give you quality time with our providers, and arriving late affects you and other patients whose appointments are after yours.  Also, if you no show three or more times for appointments you may be dismissed from the clinic at the providers discretion.     Again, thank you for choosing [facility].  Our hope is that these requests will decrease the amount of time that you wait before being seen by  our physicians.       _____________________________________________________________  Should you have questions after your visit to [facility], please contact our office at [phone] and follow the prompts.  Our office hours are 8:00 a.m. and 4:30 p.m. Monday - Friday.  Please note that voicemails left after 4:00 p.m. may not be returned until the following business day.  We are closed weekends and major holidays.  You do have access to a nurse 24-7, just call the main number to the clinic [phone] and do not press any options, hold on the line and a nurse will answer the phone.    For prescription refill requests, have your pharmacy contact our office and allow 72 hours.    Due to Covid, you will need to wear a mask upon entering the hospital. If you do not have a mask, a mask will be given to you at the Main Entrance upon arrival. For doctor visits, patients may have 1 support person age 65 or older with them. For treatment visits, patients can not have anyone with them due to social distancing guidelines and our immunocompromised population.

## 2021-11-05 ENCOUNTER — Encounter (HOSPITAL_COMMUNITY): Payer: Self-pay | Admitting: *Deleted

## 2021-11-08 ENCOUNTER — Encounter (HOSPITAL_COMMUNITY): Payer: Self-pay | Admitting: *Deleted

## 2021-11-08 NOTE — Progress Notes (Signed)
Patient has been authorized by [facility] for approval of 13 home IVIG infusions from 09/30/21-12/29/2021.  Approval number is [id number].  Document has been scanned into Epic.

## 2021-11-08 NOTE — Psych (Signed)
Virtual Visit via Video Note  I connected with Michelle Clements on 09/14/21 at  9:00 AM EDT by a video enabled telemedicine application and verified that I am speaking with the correct person using two identifiers.  Location: Patient: Home Provider: Clinical Home Office   I discussed the limitations of evaluation and management by telemedicine and the availability of in person appointments. The patient expressed understanding and agreed to proceed.  I discussed the assessment and treatment plan with the patient. The patient was provided an opportunity to ask questions and all were answered. The patient agreed with the plan and demonstrated an understanding of the instructions.   The patient was advised to call back or seek an in-person evaluation if the symptoms worsen or if the condition fails to improve as anticipated.  Pt was provided 240 minutes of non-face-to-face time during this encounter.   Michelle Glass, LCSW    [facility] PHP THERAPIST PROGRESS NOTE  Michelle Clements [id number]  Session Time: 9:00 - 10:00  Participation Level: Active  Behavioral Response: CasualAlertAnxious and Depressed  Type of Therapy: Group Therapy  Treatment Goals addressed: Coping  Interventions: CBT, DBT, Solution Focused, Strength-based, Supportive, and Reframing  Summary: Clinician led check-in regarding current stressors and situation. Clinician utilized active listening and empathetic response and validated patient emotions. Clinician facilitated processing group on pertinent issues.  Therapist Response: Michelle Clements is a 55 y.o. female who presents with depression and anxiety symptoms. Patient arrived within time allowed and reports that she is feeling "pretty good." Patient rates her mood at a 8 on a scale of 1-10 with 10 being great. Pt reports she thought about her mom a lot and feels her grief is transforming. Pt able to process. Pt engaged in discussion.            Session Time: 10:00 -  11:00   Participation Level: Active   Behavioral Response: CasualAlertDepressed   Type of Therapy: Group Therapy   Treatment Goals addressed: Coping   Interventions: CBT, DBT, Supportive and Reframing   Summary: Cln facilitated processing group around difficult relationships. Group members shared struggles they face in relationships. Cln brought in topics of self-esteem, CBT thought challenging, boundaries, and communication to aid growth.    Therapist Response:  Pt engaged in discussion and is able to process.             Session Time: 11:00- 12:00   Participation Level: Active   Behavioral Response: CasualAlertDepressed   Type of Therapy: Group Therapy, OT   Treatment Goals addressed: Coping   Interventions: Psychosocial skills training, Supportive   Summary: Occupational Therapy group   Therapist Response: Patient engaged in group when prompted. See OT note.          Session Time: 12:00 -1:00   Participation Level: Active   Behavioral Response: CasualAlertDepressed   Type of Therapy: Group therapy   Treatment Goals addressed: Coping   Interventions: CBT; Solution focused; Supportive; Reframing   Summary: 12:00 - 12:50: Cln introduced grounding techniques as a coping strategy. Cln utilized handout "Detaching from emotional pain" from [employer]. Group reviewed grounding strategies and how they can apply them to their every day life and in which situations.  12:50 -1:00 Clinician led check-out. Clinician assessed for immediate needs, medication compliance and efficacy, and safety concerns   Therapist Response: 12:00 - 12:50:  Pt engaged in discussion and is able to identify ways to utilize the techniques.  12:50 - 1:00: At check-out, patient  rates her mood at a 9 on a scale of 1-10 with 10 being great. Pt reports afternoon plans of spending time with her husband. Pt demonstrates some progress as evidenced by increased social outings. Patient denies  SI/HI at the end of group.   Suicidal/Homicidal: Nowithout intent/plan  Plan: Pt will discharge from PHP due to meeting treatment goals of decreased depression and anxiety symptoms, increased emotional regulation, and increased ability to manage symptoms in a healthy manner. Pt will step-down to IOP within this agency for continued recovery beginning 10/12. Pt and provider are aligned with discharge plan. Pt denies SI/HI at time of discharge.   Diagnosis: Severe episode of recurrent major depressive disorder, without psychotic features ([place]) [F33.2]    1. Severe episode of recurrent major depressive disorder, without psychotic features ([place])       Michelle Glass, LCSW

## 2021-11-09 DIAGNOSIS — D801 Nonfamilial hypogammaglobulinemia: Secondary | ICD-10-CM | POA: Diagnosis not present

## 2021-11-10 ENCOUNTER — Other Ambulatory Visit (HOSPITAL_COMMUNITY): Payer: Self-pay | Admitting: *Deleted

## 2021-11-10 ENCOUNTER — Telehealth (HOSPITAL_COMMUNITY): Payer: Self-pay | Admitting: *Deleted

## 2021-11-10 DIAGNOSIS — D801 Nonfamilial hypogammaglobulinemia: Secondary | ICD-10-CM

## 2021-11-10 NOTE — Telephone Encounter (Signed)
Per patient, her infusion nurse can draw labs needed prior to January 31st infusion.  Lab orders mailed to patient to provide to nurse with a request to fax to [place] upon resulting.

## 2021-11-11 ENCOUNTER — Encounter (HOSPITAL_COMMUNITY): Payer: Self-pay | Admitting: Hematology & Oncology

## 2021-11-15 ENCOUNTER — Other Ambulatory Visit: Payer: Self-pay

## 2021-11-15 ENCOUNTER — Ambulatory Visit (INDEPENDENT_AMBULATORY_CARE_PROVIDER_SITE_OTHER): Payer: HMO | Admitting: Psychiatry

## 2021-11-15 DIAGNOSIS — F319 Bipolar disorder, unspecified: Secondary | ICD-10-CM
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
## 2021-11-16 DIAGNOSIS — M461 Sacroiliitis, not elsewhere classified: Secondary | ICD-10-CM | POA: Diagnosis not present

## 2021-11-17 DIAGNOSIS — M5416 Radiculopathy, lumbar region: Secondary | ICD-10-CM | POA: Diagnosis not present

## 2021-11-17 DIAGNOSIS — G4733 Obstructive sleep apnea (adult) (pediatric): Secondary | ICD-10-CM | POA: Diagnosis not present

## 2021-11-17 DIAGNOSIS — G5603 Carpal tunnel syndrome, bilateral upper limbs: Secondary | ICD-10-CM | POA: Diagnosis not present

## 2021-11-17 DIAGNOSIS — M545 Low back pain, unspecified: Secondary | ICD-10-CM | POA: Diagnosis not present

## 2021-11-17 DIAGNOSIS — M5412 Radiculopathy, cervical region: Secondary | ICD-10-CM | POA: Diagnosis not present

## 2021-11-17 DIAGNOSIS — G5620 Lesion of ulnar nerve, unspecified upper limb: Secondary | ICD-10-CM | POA: Diagnosis not present

## 2021-11-17 DIAGNOSIS — M542 Cervicalgia: Secondary | ICD-10-CM | POA: Diagnosis not present

## 2021-11-17 DIAGNOSIS — M47896 Other spondylosis, lumbar region: Secondary | ICD-10-CM | POA: Diagnosis not present

## 2021-11-17 DIAGNOSIS — M797 Fibromyalgia: Secondary | ICD-10-CM | POA: Diagnosis not present

## 2021-11-22 ENCOUNTER — Other Ambulatory Visit: Payer: Self-pay

## 2021-11-22 ENCOUNTER — Telehealth (INDEPENDENT_AMBULATORY_CARE_PROVIDER_SITE_OTHER): Payer: HMO | Admitting: Psychiatry

## 2021-11-22 ENCOUNTER — Encounter (HOSPITAL_COMMUNITY): Payer: Self-pay | Admitting: Psychiatry

## 2021-11-22 DIAGNOSIS — F319 Bipolar disorder, unspecified: Secondary | ICD-10-CM

## 2021-11-22 DIAGNOSIS — F332 Major depressive disorder, recurrent severe without psychotic features: Secondary | ICD-10-CM

## 2021-11-22 MED ORDER — FLUOXETINE HCL 40 MG PO CAPS: 40.0000 mg | ORAL_CAPSULE | Freq: Every day | ORAL | 2 refills | Status: DC

## 2021-11-22 MED ORDER — CLONAZEPAM 0.5 MG PO TABS: 0.5000 mg | ORAL_TABLET | Freq: Two times a day (BID) | ORAL | 2 refills | Status: DC

## 2021-11-22 MED ORDER — ESZOPICLONE 3 MG PO TABS: 3.0000 mg | ORAL_TABLET | Freq: Every evening | ORAL | 2 refills | Status: DC | PRN

## 2021-11-22 MED ORDER — BUPROPION HCL ER (XL) 300 MG PO TB24: 300.0000 mg | ORAL_TABLET | ORAL | 2 refills | Status: DC

## 2021-11-22 NOTE — Progress Notes (Signed)
Virtual Visit via Video Note  I connected with Michelle Clements on 11/22/21 at 10:40 AM EST by a video enabled telemedicine application and verified that I am speaking with the correct person using two identifiers.  Location: Patient: home Provider: home office   I discussed the limitations of evaluation and management by telemedicine and the availability of in person appointments. The patient expressed understanding and agreed to proceed.      I discussed the assessment and treatment plan with the patient. The patient was provided an opportunity to ask questions and all were answered. The patient agreed with the plan and demonstrated an understanding of the instructions.   The patient was advised to call back or seek an in-person evaluation if the symptoms worsen or if the condition fails to improve as anticipated.  I provided 20 minutes of non-face-to-face time during this encounter.   [name], MD  [facility] MD/PA/NP OP Progress Note  11/22/2021 11:06 AM Michelle Clements  MRN:  [id number]  Chief Complaint:  Chief Complaint   Anxiety; Depression; Follow-up    HPI: This patient is a 55year-old married white female who lives with her husband in [place]. She has two daughters  who live out of the home. 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  She states for the most part she is doing well.  Her daughter has been diagnosed with neuro sarcoma but she is being seen at [facility] and a treatment plan has been developed.  The patient is trying to stay positive.  She is got much more active at church.  She is mainly concerned because she is not sleeping well.  The 50 mg trazodone made her too groggy the next day and the 25 is not keeping her asleep.  I suggested we try Lunesta and she agrees.  Her mood has been stable and she denies significant depression anxiety or thoughts of self-harm or suicide Visit Diagnosis:    ICD-10-CM   1. Bipolar  1 disorder (HCC)  F31.9     2. Major depressive disorder, recurrent, severe without psychotic features ([place])  F33.2       Past Psychiatric History: Recent admission for overdose/suicide attempt followed by partial hospital program.  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[place])    Depression    Essential hypertension, benign    Fibromyalgia    GERD (gastroesophageal reflux disease)    H/O sleep apnea    Hip pain, left    History of palpitations    Negative Holter monitor   History of pneumonia 1988   Hyperlipidemia    IBS (irritable bowel syndrome)    Neck pain    Poor short term memory    PTSD (post-traumatic stress disorder)    S/P Botox injection 11/2014    For migraine headaches   Type 2 diabetes mellitus ([place])    Urgency of urination     Past Surgical History:  Procedure Laterality Date   CARPAL TUNNEL RELEASE Right 06/10/2013   Procedure: CARPAL TUNNEL RELEASE;  Surgeon: Faythe Ghee, MD;  Location: [facility]  ORS;  Service: Neurosurgery;  Laterality: Right;  Right Carpal Tunnel Release    CHOLECYSTECTOMY     DENTAL SURGERY  12/2014   mole exc     x 3   MUSCLE BIOPSY  2008   Right leg   TUBAL LIGATION      Family Psychiatric History: see below  Family History:  Family History  Problem Relation Age of Onset   Fibromyalgia Mother    Diabetes type II Mother    Depression Mother    Alzheimer's disease Mother    GER disease Mother    Heart disease Mother    Paranoid behavior Mother    Dementia Mother    Heart attack Father 3       MI x5   Stroke Father        CVA x7   Asthma Father    Brain cancer Father    Seizures Father    Anxiety disorder Sister    OCD Sister    Sexual abuse  Sister    Physical abuse Sister    Anxiety disorder Sister    ADD / ADHD Daughter    ADD / ADHD Daughter    Alcohol abuse Neg Hx    Drug abuse Neg Hx    Schizophrenia Neg Hx     Social History:  Social History   Socioeconomic History   Marital status: Married    Spouse name: Not on file   Number of children: 2   Years of education: College   Highest education level: Associate degree: academic program  Occupational History   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   Caffeine use: 2 cups coffee in morning and 2 cups tea during the day    Social Determinants of Health   Financial Resource Strain: Low Risk    Difficulty of Paying Living Expenses: Not very hard  Food Insecurity: No Food Insecurity   Worried About Charity fundraiser in the Last Year: Never true   Ran Out of Food in the Last Year: Never true  Transportation Needs: No Transportation Needs   Lack of Transportation (Medical): No   Lack of Transportation (Non-Medical): No  Physical Activity: Inactive   Days of Exercise per Week: 0 days   Minutes of Exercise per Session: 0 min  Stress: No Stress Concern Present   Feeling of Stress : Not at all  Social Connections: Moderately Integrated   Frequency of Communication with Friends and Family: More than three times a week   Frequency of Social Gatherings with Friends and Family: More than three times a week   Attends Religious Services:  More than 4 times per year   Active Member of [employer] or Organizations: No   Attends Archivist Meetings: Never   Marital Status: Married    Allergies:  Allergies  Allergen Reactions   Molds & Smuts Shortness Of Breath    wheezing   Other Anaphylaxis, Shortness Of Breath and Other (See Comments)     : Angioedema wheezing Wheezing wheezing Other reaction(s): Cough Wheezing Itching and rash Wheezing wheezing Wheezing wheezing Other reaction(s): Other (See Comments)  : Angioedema wheezing Wheezing wheezing Other reaction(s): Cough Wheezing Itching and rash Wheezing wheezing Other reaction(s): Other (See Comments) Wheezing wheezing  : Angioedema wheezing Wheezing wheezing Other reaction(s): Cough Wheezing Itching and rash Wheezing wheezing Wheezing wheezing   Sulfa Antibiotics Anaphylaxis     : Angioedema    Sulfonamide Derivatives Anaphylaxis     : Angioedema   Trichophyton Shortness Of Breath    wheezing wheezing wheezing wheezing wheezing wheezing wheezing wheezing wheezing   Carbamazepine Rash   Dust Mite Extract Cough and Other (See Comments)    Wheezing Other reaction(s): Wheezing Wheezing Wheezing Other reaction(s): Wheezing Wheezing Wheezing Other reaction(s): Wheezing Wheezing   Gluten Meal Itching   Clonazepam Other (See Comments)    Confusion and memory loss Caused nconfusion    Metabolic Disorder Labs: Lab Results  Component Value Date   HGBA1C 5.7 08/25/2021   MPG 131 03/18/2015   No results found for: PROLACTIN Lab Results  Component Value Date   CHOL 196 08/06/2021   TRIG 144 08/06/2021   HDL 54 08/06/2021   CHOLHDL 4.2 03/18/2015   VLDL 44 (H) 03/18/2015   LDLCALC 117 08/06/2021   LDLCALC 64 12/29/2020   Lab Results  Component Value Date   TSH 2.69 12/29/2020   TSH 1.66 09/14/2020    Therapeutic Level Labs: Lab Results  Component Value Date   LITHIUM 0.20 (L) 02/21/2012   No results found  for: VALPROATE No components found for:  CBMZ  Current Medications: Current Outpatient Medications  Medication Sig Dispense Refill   eszopiclone 3 MG TABS Take 1 tablet (3 mg total) by mouth at bedtime as needed. Take immediately before bedtime 30 tablet 2   albuterol (PROVENTIL HFA;VENTOLIN HFA) 108 (90 BASE) MCG/ACT inhaler Inhale 2 puffs into the lungs every 6 (six) hours as needed for wheezing or shortness of breath. (Patient not taking: Reported on 11/04/2021)     Ascorbic Acid (VITAMIN C) 1000 MG tablet Take 1,000 mg by mouth daily.     Blood Glucose Monitoring Suppl ([place]) w/Device KIT See admin instructions.     budesonide-formoterol (SYMBICORT) 160-4.5 MCG/ACT inhaler Inhale into the lungs.     buprenorphine (BUTRANS) 5 MCG/HR PTWK Butrans 5 mcg/hour transdermal patch  Apply 1 patch every week by transdermal route.     buPROPion (WELLBUTRIN XL) 300 MG 24 hr tablet Take 1 tablet (300 mg total) by mouth every morning. 30 tablet 2   cetirizine (ZYRTEC) 10 MG tablet Take 10 mg by mouth at bedtime.     Cholecalciferol (VITAMIN D) 50 MCG (2000 UT) tablet Take 2,000 Units by mouth daily.     clonazePAM (KLONOPIN) 0.5 MG tablet Take 1 tablet (0.5 mg total) by mouth 2 (two) times daily. 60 tablet 2   cycloSPORINE (RESTASIS) 0.05 % ophthalmic emulsion Apply to eye.     estradiol (ESTRACE) 0.1 MG/GM vaginal cream Place vaginally.     FLUoxetine (PROZAC) 40 MG capsule Take 1 capsule (40 mg total) by mouth daily. 30 capsule 2   fluticasone (FLONASE) 50 MCG/ACT nasal spray Place 1 spray into both nostrils daily.     GAMUNEX-C 20 GM/200ML SOLN      Glucos-Chond-Hyal Ac-Ca Fructo (  MOVE FREE JOINT HEALTH ADVANCE PO) Take by mouth.     HYDROcodone-acetaminophen (NORCO) 10-325 MG tablet Take by mouth.     insulin glargine (LANTUS SOLOSTAR) 100 UNIT/ML Solostar Pen Inject 50 Units into the skin daily.     Lancets (ONETOUCH ULTRASOFT) lancets 1 each by Other route as needed.       levocetirizine (XYZAL) 5 MG tablet levocetirizine 5 mg tablet     lidocaine (LIDODERM) 5 % lidocaine 5 % topical patch  APPLY 1 PATCH BY TOPICAL ROUTE ONCE DAILY (MAY WEAR UP TO 12HOURS.)     lubiprostone (AMITIZA) 24 MCG capsule TAKE ONE CAPSULE BY MOUTH EVERY MORNING and TAKE ONE CAPSULE BY MOUTH EVERY EVENING     Melatonin 5 MG TABS Take 5 mg by mouth at bedtime as needed.  (Patient not taking: Reported on 11/04/2021)     metFORMIN (GLUCOPHAGE) 500 MG tablet Take by mouth.     Multiple Vitamin (MULTIVITAMIN) tablet Take 1 tablet by mouth daily.     omeprazole (PRILOSEC) 20 MG capsule Take 20 mg by mouth daily.     ONETOUCH VERIO test strip 4 (four) times daily.     pregabalin (LYRICA) 200 MG capsule Take 200 mg by mouth 3 (three) times daily.     rosuvastatin (CRESTOR) 20 MG tablet Take 1 tablet (20 mg total) by mouth at bedtime. 90 tablet 3   sennosides-docusate sodium (SENOKOT-S) 8.6-50 MG tablet Take 1 tablet by mouth daily.     SITagliptin Phosphate (JANUVIA PO) Januvia  1 every night     tiZANidine (ZANAFLEX) 4 MG tablet Take 4 mg by mouth daily. 33m at bedtime and 2 mg during the day     traZODone (DESYREL) 50 MG tablet Take 1 tablet (50 mg total) by mouth at bedtime. 30 tablet 2   vitamin B-12 (CYANOCOBALAMIN) 100 MCG tablet Take 100 mcg by mouth daily.     zinc gluconate 50 MG tablet Take 50 mg by mouth daily.     No current facility-administered medications for this visit.     Musculoskeletal: Strength & Muscle Tone: within normal limits Gait & Station: normal Patient leans: N/A  Psychiatric Specialty Exam: Review of Systems  Musculoskeletal:  Positive for back pain.  Psychiatric/Behavioral:  Positive for sleep disturbance.   All other systems reviewed and are negative.  Last menstrual period 01/04/2018.There is no height or weight on file to calculate BMI.  General Appearance: Casual and Fairly Groomed  Eye Contact:  Good  Speech:  Clear and Coherent  Volume:  Normal   Mood:  Euthymic  Affect:  Appropriate and Congruent  Thought Process:  Goal Directed  Orientation:  Full (Time, Place, and Person)  Thought Content: Rumination   Suicidal Thoughts:  No  Homicidal Thoughts:  No  Memory:  Immediate;   Good Recent;   Good Remote;   Good  Judgement:  Good  Insight:  Fair  Psychomotor Activity:  Decreased  Concentration:  Concentration: Good and Attention Span: Good  Recall:  Good  Fund of Knowledge: Good  Language: Good  Akathisia:  No  Handed:  Right  AIMS (if indicated): not done  Assets:  Communication Skills Desire for Improvement Resilience Social Support Talents/Skills  ADL's:  Intact  Cognition: WNL  Sleep:  Poor   Screenings: Mini-Mental    [place] [facility]  Total Score (max 30 points ) 27      PHQ2-9    Flowsheet Row Video  Visit from 11/22/2021 in [place] Video Visit from 10/26/2021 in [facility] [facility] Video Visit from 10/06/2021 in [facility] [facility] Video Visit from 09/30/2021 in [facility] Counselor from 09/15/2021 in [place]  PHQ-2 Total Score 0 0 0 3 1  PHQ-9 Total Score -- -- -- 8 6      Flowsheet Row Video Visit from 11/22/2021 in [place] [facility] Video Visit from 10/26/2021 in [place] [facility] [facility] Video Visit from 10/06/2021 in [place] Error: Q3, 4, or 5 should not be populated when Q2 is No Error: Q3, 4, or 5 should not be populated when Q2 is No Error: Q3, 4, or 5 should not be populated when Q2 is No        Assessment and Plan: This patient is a 55 year old female with a history of PTSD depression anxiety hallucinations and  nightmares.  She is having difficulty sleeping and the trazodone is not working.  We will discontinue this in favor of Lunesta 3 mg at bedtime.  She will continue clonazepam 0.5 mg twice daily for anxiety, Wellbutrin XL 300 mg daily and Prozac 40 mg daily for depression.  She will return to see me in 6 weeks   [name], MD 11/22/2021, 11:06 AM

## 2021-11-25 DIAGNOSIS — S338XXA Sprain of other parts of lumbar spine and pelvis, initial encounter: Secondary | ICD-10-CM | POA: Diagnosis not present

## 2021-11-25 DIAGNOSIS — S134XXA Sprain of ligaments of cervical spine, initial encounter: Secondary | ICD-10-CM | POA: Diagnosis not present

## 2021-11-25 DIAGNOSIS — S233XXA Sprain of ligaments of thoracic spine, initial encounter: Secondary | ICD-10-CM | POA: Diagnosis not present

## 2021-11-30 ENCOUNTER — Ambulatory Visit (INDEPENDENT_AMBULATORY_CARE_PROVIDER_SITE_OTHER): Payer: HMO | Admitting: Psychiatry

## 2021-11-30 ENCOUNTER — Other Ambulatory Visit: Payer: Self-pay

## 2021-11-30 DIAGNOSIS — F319 Bipolar disorder, unspecified: Secondary | ICD-10-CM | POA: Diagnosis not present

## 2021-11-30 DIAGNOSIS — E1169 Type 2 diabetes mellitus with other specified complication: Secondary | ICD-10-CM | POA: Diagnosis not present

## 2021-11-30 DIAGNOSIS — E782 Mixed hyperlipidemia: Secondary | ICD-10-CM | POA: Diagnosis not present

## 2021-11-30 DIAGNOSIS — E559 Vitamin D deficiency, unspecified: Secondary | ICD-10-CM | POA: Diagnosis not present
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
## 2021-12-01 DIAGNOSIS — M47816 Spondylosis without myelopathy or radiculopathy, lumbar region: Secondary | ICD-10-CM | POA: Diagnosis not present

## 2021-12-02 ENCOUNTER — Other Ambulatory Visit (HOSPITAL_COMMUNITY): Payer: Self-pay | Admitting: Family Medicine

## 2021-12-02 ENCOUNTER — Other Ambulatory Visit: Payer: Self-pay | Admitting: Family Medicine

## 2021-12-02 DIAGNOSIS — G894 Chronic pain syndrome: Secondary | ICD-10-CM | POA: Diagnosis not present

## 2021-12-02 DIAGNOSIS — J4540 Moderate persistent asthma, uncomplicated: Secondary | ICD-10-CM | POA: Diagnosis not present

## 2021-12-02 DIAGNOSIS — E1169 Type 2 diabetes mellitus with other specified complication: Secondary | ICD-10-CM | POA: Diagnosis not present

## 2021-12-02 DIAGNOSIS — E782 Mixed hyperlipidemia: Secondary | ICD-10-CM | POA: Diagnosis not present

## 2021-12-02 DIAGNOSIS — R519 Headache, unspecified: Secondary | ICD-10-CM

## 2021-12-02 DIAGNOSIS — R5382 Chronic fatigue, unspecified: Secondary | ICD-10-CM | POA: Diagnosis not present

## 2021-12-02 DIAGNOSIS — F319 Bipolar disorder, unspecified: Secondary | ICD-10-CM | POA: Diagnosis not present

## 2021-12-02 DIAGNOSIS — E559 Vitamin D deficiency, unspecified: Secondary | ICD-10-CM | POA: Diagnosis not present

## 2021-12-02 DIAGNOSIS — N3010 Interstitial cystitis (chronic) without hematuria: Secondary | ICD-10-CM | POA: Diagnosis not present

## 2021-12-02 DIAGNOSIS — D849 Immunodeficiency, unspecified: Secondary | ICD-10-CM | POA: Diagnosis not present

## 2021-12-02 DIAGNOSIS — R945 Abnormal results of liver function studies: Secondary | ICD-10-CM | POA: Diagnosis not present

## 2021-12-02 DIAGNOSIS — M797 Fibromyalgia: Secondary | ICD-10-CM | POA: Diagnosis not present

## 2021-12-02 DIAGNOSIS — N1831 Chronic kidney disease, stage 3a: Secondary | ICD-10-CM | POA: Diagnosis not present

## 2021-12-03 DIAGNOSIS — N1831 Chronic kidney disease, stage 3a: Secondary | ICD-10-CM | POA: Diagnosis not present

## 2021-12-03 DIAGNOSIS — E1165 Type 2 diabetes mellitus with hyperglycemia: Secondary | ICD-10-CM | POA: Diagnosis not present

## 2021-12-03 DIAGNOSIS — E782 Mixed hyperlipidemia: Secondary | ICD-10-CM | POA: Diagnosis not present

## 2021-12-03 DIAGNOSIS — I129 Hypertensive chronic kidney disease with stage 1 through stage 4 chronic kidney disease, or unspecified chronic kidney disease: Secondary | ICD-10-CM | POA: Diagnosis not present

## 2021-12-07 DIAGNOSIS — D801 Nonfamilial hypogammaglobulinemia: Secondary | ICD-10-CM | POA: Diagnosis not present

## 2021-12-09 ENCOUNTER — Telehealth (HOSPITAL_COMMUNITY): Payer: Self-pay

## 2021-12-09 NOTE — Telephone Encounter (Signed)
RECEIVED A FAX FROM [facility] REQUESTING A PA ON PATIENT'S ESZOPICLONE 3MG  TABLET SPOKE WITH KHUSHBU AT [facility] AND SHE STATED THAT THIS MEDICATION DOES NOT NEED A PA  NOTIFIED PHARMACY

## 2021-12-13 ENCOUNTER — Ambulatory Visit (HOSPITAL_COMMUNITY)
Admission: RE | Admit: 2021-12-13 | Discharge: 2021-12-13 | Disposition: A | Discharge status: Home / Self Care | Payer: HMO | Source: Ambulatory Visit | Attending: Family Medicine | Admitting: Family Medicine

## 2021-12-13 ENCOUNTER — Other Ambulatory Visit: Payer: Self-pay

## 2021-12-13 DIAGNOSIS — R519 Headache, unspecified: Secondary | ICD-10-CM | POA: Insufficient documentation

## 2021-12-14 ENCOUNTER — Encounter (HOSPITAL_COMMUNITY): Payer: Self-pay | Admitting: Hematology & Oncology

## 2021-12-14 ENCOUNTER — Ambulatory Visit (INDEPENDENT_AMBULATORY_CARE_PROVIDER_SITE_OTHER): Payer: HMO | Admitting: Psychiatry

## 2021-12-14 ENCOUNTER — Encounter (HOSPITAL_COMMUNITY): Payer: Self-pay

## 2021-12-14 DIAGNOSIS — F319 Bipolar disorder, unspecified: Secondary | ICD-10-CM | POA: Diagnosis not present

## 2021-12-14 NOTE — Plan of Care (Signed)
Pt participated in development of plan
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
## 2021-12-15 ENCOUNTER — Encounter (HOSPITAL_COMMUNITY): Payer: Self-pay | Admitting: Hematology & Oncology

## 2021-12-22 ENCOUNTER — Telehealth: Payer: Self-pay | Admitting: *Deleted

## 2021-12-22 NOTE — Telephone Encounter (Signed)
Spoke with [name] and infusion nurse Carrie Mew, RN regarding drawing CBCD and IGG,IGA,IGM prior to each IVIG infusion.  Arranged this with [facility] and will be collected and faxed to [place] with each infusion.  Per pharmacist, orders will expire 08/24/22.  Orders received from Tarri Abernethy- NP and will be signed off on by Dr Delton Coombes, per company's policy.  Telephone number for [facility] is [phone].

## 2021-12-23 ENCOUNTER — Telehealth (HOSPITAL_COMMUNITY): Payer: Self-pay | Admitting: *Deleted

## 2021-12-23 DIAGNOSIS — M5412 Radiculopathy, cervical region: Secondary | ICD-10-CM | POA: Diagnosis not present

## 2021-12-23 DIAGNOSIS — Z794 Long term (current) use of insulin: Secondary | ICD-10-CM | POA: Diagnosis not present

## 2021-12-23 DIAGNOSIS — M47816 Spondylosis without myelopathy or radiculopathy, lumbar region: Secondary | ICD-10-CM | POA: Diagnosis not present

## 2021-12-23 DIAGNOSIS — E1165 Type 2 diabetes mellitus with hyperglycemia: Secondary | ICD-10-CM | POA: Diagnosis not present

## 2021-12-23 DIAGNOSIS — M4802 Spinal stenosis, cervical region: Secondary | ICD-10-CM | POA: Diagnosis not present

## 2021-12-23 DIAGNOSIS — M461 Sacroiliitis, not elsewhere classified: Secondary | ICD-10-CM | POA: Diagnosis not present

## 2021-12-23 DIAGNOSIS — R350 Frequency of micturition: Secondary | ICD-10-CM | POA: Diagnosis not present

## 2021-12-23 LAB — COMPREHENSIVE METABOLIC PANEL
Albumin: 4.4 (ref 3.5–5.0)
Calcium: 10.1 (ref 8.7–10.7)
Globulin: 2.9

## 2021-12-23 LAB — BASIC METABOLIC PANEL
BUN: 20 (ref 4–21)
CO2: 21 (ref 13–22)
Chloride: 100 (ref 99–108)
Creatinine: 1.2 — AB (ref 0.5–1.1)
Glucose: 153
Potassium: 5 (ref 3.4–5.3)
Sodium: 139 (ref 137–147)

## 2021-12-23 LAB — HEPATIC FUNCTION PANEL
ALT: 39 — AB (ref 7–35)
AST: 43 — AB (ref 13–35)
Alkaline Phosphatase: 83 (ref 25–125)
Bilirubin, Total: 0.2

## 2021-12-23 NOTE — Telephone Encounter (Signed)
Tell her she can take it instead

## 2021-12-23 NOTE — Telephone Encounter (Signed)
Patient called stating she stopped her Wellbutrin 3 days ago due to having really bad headaches.   Per pt her pain med doctor started her on Cymbalta  and she don't know if that could take the place of her Wellbutrin. Per pt she have not picked it up from the pharmacy yet. But she wants Dr. Harrington Challenger advise.

## 2021-12-24 NOTE — Telephone Encounter (Signed)
Staff informed patient with message and she verbalized understanding.

## 2021-12-28 ENCOUNTER — Ambulatory Visit (INDEPENDENT_AMBULATORY_CARE_PROVIDER_SITE_OTHER): Payer: HMO | Admitting: Psychiatry

## 2021-12-28 ENCOUNTER — Other Ambulatory Visit: Payer: Self-pay

## 2021-12-28 ENCOUNTER — Ambulatory Visit: Payer: HMO | Admitting: Nurse Practitioner

## 2021-12-28 DIAGNOSIS — F319 Bipolar disorder, unspecified: Secondary | ICD-10-CM

## 2021-12-28 NOTE — Patient Instructions (Incomplete)
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
## 2021-12-30 DIAGNOSIS — H401390 Pigmentary glaucoma, unspecified eye, stage unspecified: Secondary | ICD-10-CM | POA: Diagnosis not present

## 2021-12-30 DIAGNOSIS — R519 Headache, unspecified: Secondary | ICD-10-CM | POA: Diagnosis not present

## 2021-12-30 DIAGNOSIS — H04123 Dry eye syndrome of bilateral lacrimal glands: Secondary | ICD-10-CM | POA: Diagnosis not present

## 2021-12-30 DIAGNOSIS — H18513 Endothelial corneal dystrophy, bilateral: Secondary | ICD-10-CM | POA: Diagnosis not present

## 2021-12-31 ENCOUNTER — Encounter (HOSPITAL_COMMUNITY): Payer: Self-pay

## 2021-12-31 DIAGNOSIS — G4733 Obstructive sleep apnea (adult) (pediatric): Secondary | ICD-10-CM | POA: Diagnosis not present

## 2021-12-31 DIAGNOSIS — M797 Fibromyalgia: Secondary | ICD-10-CM | POA: Diagnosis not present

## 2021-12-31 DIAGNOSIS — R519 Headache, unspecified: Secondary | ICD-10-CM | POA: Diagnosis not present

## 2021-12-31 DIAGNOSIS — R030 Elevated blood-pressure reading, without diagnosis of hypertension: Secondary | ICD-10-CM | POA: Insufficient documentation

## 2021-12-31 DIAGNOSIS — M5412 Radiculopathy, cervical region: Secondary | ICD-10-CM | POA: Diagnosis not present

## 2021-12-31 DIAGNOSIS — M542 Cervicalgia: Secondary | ICD-10-CM | POA: Diagnosis not present

## 2021-12-31 DIAGNOSIS — Z79891 Long term (current) use of opiate analgesic: Secondary | ICD-10-CM | POA: Diagnosis not present

## 2021-12-31 DIAGNOSIS — J0100 Acute maxillary sinusitis, unspecified: Secondary | ICD-10-CM | POA: Diagnosis not present

## 2021-12-31 DIAGNOSIS — M545 Low back pain, unspecified: Secondary | ICD-10-CM | POA: Diagnosis not present

## 2022-01-04 ENCOUNTER — Encounter: Payer: Self-pay | Admitting: Nurse Practitioner

## 2022-01-04 ENCOUNTER — Other Ambulatory Visit (HOSPITAL_COMMUNITY): Payer: Self-pay | Admitting: Psychiatry

## 2022-01-04 ENCOUNTER — Other Ambulatory Visit: Payer: Self-pay

## 2022-01-04 ENCOUNTER — Ambulatory Visit (INDEPENDENT_AMBULATORY_CARE_PROVIDER_SITE_OTHER): Payer: HMO | Admitting: Nurse Practitioner

## 2022-01-04 VITALS — BP 129/83 | HR 89 | Ht 71.0 in | Wt 237.2 lb

## 2022-01-04 DIAGNOSIS — Z794 Long term (current) use of insulin: Secondary | ICD-10-CM | POA: Diagnosis not present

## 2022-01-04 DIAGNOSIS — E1165 Type 2 diabetes mellitus with hyperglycemia: Secondary | ICD-10-CM | POA: Diagnosis not present

## 2022-01-04 DIAGNOSIS — K219 Gastro-esophageal reflux disease without esophagitis: Secondary | ICD-10-CM | POA: Diagnosis not present

## 2022-01-04 LAB — POCT GLYCOSYLATED HEMOGLOBIN (HGB A1C): HbA1c, POC (controlled diabetic range): 6.1 % (ref 0.0–7.0)

## 2022-01-04 NOTE — Progress Notes (Signed)
Endocrinology Follow Up Visit       01/04/2022, 3:33 PM   Subjective:    Patient ID: Michelle Clements, female    DOB: [birth year].  Michelle Clements is being seen in follow up after being seen in consultation for management of currently uncontrolled symptomatic diabetes requested by  [name], MD.   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[place])    Depression    Essential hypertension, benign    Fibromyalgia    GERD (gastroesophageal reflux disease)    H/O sleep apnea    Hip pain, left    History of palpitations    Negative Holter monitor   History of pneumonia 1988   Hyperlipidemia    IBS (irritable bowel syndrome)    Neck pain    Poor short term memory    PTSD (post-traumatic stress disorder)    S/P Botox injection 11/2014    For migraine headaches   Type 2 diabetes mellitus ([place])    Urgency of urination     Past Surgical History:  Procedure Laterality Date   CARPAL TUNNEL RELEASE Right 06/10/2013   Procedure: CARPAL TUNNEL RELEASE;  Surgeon: Faythe Ghee, MD;  Location: [facility];  Service: Neurosurgery;  Laterality: Right;  Right Carpal Tunnel Release    CHOLECYSTECTOMY     DENTAL SURGERY  12/2014   mole exc     x 3   MUSCLE BIOPSY  2008   Right leg   TUBAL LIGATION      Social History   Socioeconomic History   Marital status: Married    Spouse name: Not on file   Number of children: 2   Years of education: College   Highest education level: Associate degree: academic program  Occupational History   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   Caffeine use: 2 cups coffee in morning and 2 cups tea during the day    Social Determinants of Health   Financial Resource Strain: Low Risk    Difficulty of Paying Living Expenses: Not very hard  Food Insecurity: No Food Insecurity   Worried About Charity fundraiser in the Last Year: Never true   Ran Out of Food in the Last Year: Never true  Transportation Needs: No Data processing manager (Medical): No   Lack of Transportation (Non-Medical): No  Physical Activity: Inactive   Days of Exercise per Week: 0 days   Minutes of Exercise per Session: 0 min  Stress: No Stress Concern Present   Feeling of Stress : Not at all  Social Connections: Moderately Integrated   Frequency of Communication with Friends and Family: More than three times a week   Frequency of Social Gatherings with Friends and Family: More than three times a week   Attends Religious Services: More than 4 times per year   Active Member of [employer] or Organizations: No   Attends Music therapist: Never   Marital Status: Married    Family History  Problem Relation Age of Onset   Fibromyalgia Mother    Diabetes type II Mother    Depression Mother    Alzheimer's disease Mother    GER disease Mother    Heart disease Mother    Paranoid behavior Mother    Dementia Mother    Heart attack Father 53       MI x5   Stroke Father        CVA x7   Asthma Father    Brain cancer Father    Seizures Father    Anxiety disorder Sister    OCD Sister    Sexual abuse Sister    Physical abuse Sister    Anxiety  disorder Sister    ADD / ADHD Daughter    ADD / ADHD Daughter    Alcohol abuse Neg Hx    Drug abuse Neg Hx    Schizophrenia Neg Hx     Outpatient Encounter Medications as of 01/04/2022  Medication Sig   albuterol (VENTOLIN HFA) 108 (90 Base) MCG/ACT inhaler albuterol sulfate HFA 90 mcg/actuation aerosol inhaler  INHALE TWO PUFFS BY MOUTH INTO LUNGS EVERY 6 HOURS AS NEEDED   Ascorbic Acid (VITAMIN C) 1000 MG tablet Take 1,000 mg by mouth daily.   Blood Glucose Monitoring Suppl ([place]) w/Device KIT See admin instructions.   buPROPion (WELLBUTRIN XL) 300 MG 24 hr tablet Take 1 tablet (300 mg total) by mouth every morning.   cetirizine (ZYRTEC) 10 MG tablet Take 10 mg by mouth at bedtime.   Cholecalciferol (VITAMIN D) 50 MCG (2000 UT) tablet Take 2,000 Units by mouth daily.   clonazePAM (KLONOPIN) 0.5 MG tablet Take 1 tablet (0.5 mg total) by mouth 2 (two) times daily.   cycloSPORINE (RESTASIS) 0.05 % ophthalmic emulsion Apply to eye.   DULoxetine (CYMBALTA) 30 MG capsule Take 30 mg by mouth daily.   estradiol (ESTRACE) 0.1 MG/GM vaginal cream Place vaginally.   eszopiclone 3 MG TABS Take 1 tablet (3 mg total) by mouth at bedtime as needed. Take immediately before bedtime   FLUoxetine (PROZAC) 40 MG capsule Take 1 capsule (40 mg total) by mouth daily.   fluticasone (FLONASE) 50 MCG/ACT nasal spray Place 1 spray into both nostrils daily.   GAMUNEX-C 20 GM/200ML SOLN    Glucos-Chond-Hyal Ac-Ca Fructo (MOVE FREE JOINT HEALTH ADVANCE PO) Take by mouth.   ibuprofen (ADVIL) 800 MG tablet SMARTSIG:1 Tablet(s) By Mouth Morning-Night   insulin glargine (LANTUS SOLOSTAR) 100 UNIT/ML Solostar Pen Inject 50 Units into the skin daily.   Lancets (ONETOUCH ULTRASOFT) lancets 1 each by Other route as needed.    lidocaine (LIDODERM) 5 % lidocaine 5 % topical patch  APPLY 1 PATCH BY TOPICAL ROUTE ONCE  DAILY (MAY WEAR UP TO 12HOURS.)   lisinopril (ZESTRIL) 5 MG tablet lisinopril 5 mg  tablet  Take 1 tablet every day by oral route.   Multiple Vitamin (MULTIVITAMIN) tablet Take 1 tablet by mouth daily.   omeprazole (PRILOSEC) 20 MG capsule Take 20 mg by mouth daily.   ONETOUCH VERIO test strip 4 (four) times daily.   pregabalin (LYRICA) 200 MG capsule Take 200 mg by mouth 3 (three) times daily.   rosuvastatin (CRESTOR) 20 MG tablet Take 1 tablet (20 mg total) by mouth at bedtime.   sennosides-docusate sodium (SENOKOT-S) 8.6-50 MG tablet Take 1 tablet by mouth daily.   tiZANidine (ZANAFLEX) 4 MG tablet Take 4 mg by mouth daily. 6m at bedtime and 2 mg during the day   traMADol (ULTRAM) 50 MG tablet Take 100 mg by mouth every 8 (eight) hours as needed.   zinc gluconate 50 MG tablet Take 50 mg by mouth daily.   No facility-administered encounter medications on file as of 01/04/2022.    ALLERGIES: Allergies  Allergen Reactions   Molds & Smuts Shortness Of Breath    wheezing   Other Anaphylaxis, Shortness Of Breath and Other (See Comments)     : Angioedema wheezing Wheezing wheezing Other reaction(s): Cough Wheezing Itching and rash Wheezing wheezing Wheezing wheezing Other reaction(s): Other (See Comments)  : Angioedema wheezing Wheezing wheezing Other reaction(s): Cough Wheezing Itching and rash Wheezing wheezing Other reaction(s): Other (See Comments) Wheezing wheezing  : Angioedema wheezing Wheezing wheezing Other reaction(s): Cough Wheezing Itching and rash Wheezing wheezing Wheezing wheezing   Sulfa Antibiotics Anaphylaxis     : Angioedema    Sulfonamide Derivatives Anaphylaxis     : Angioedema   Trichophyton Shortness Of Breath    wheezing wheezing wheezing wheezing wheezing wheezing wheezing wheezing wheezing   Carbamazepine Rash   Dust Mite Extract Cough and Other (See Comments)    Wheezing Other reaction(s): Wheezing Wheezing Wheezing Other reaction(s): Wheezing Wheezing Wheezing Other reaction(s):  Wheezing Wheezing   Gluten Meal Itching   Clonazepam Other (See Comments)    Confusion and memory loss Caused nconfusion    VACCINATION STATUS: Immunization History  Administered Date(s) Administered   Influenza Whole 10/03/2007   Influenza-Unspecified 09/18/2014, 09/23/2015   Pneumococcal Polysaccharide-23 10/14/2012    Diabetes She presents for her follow-up diabetic visit. She has type 2 diabetes mellitus. Onset time: She was diagnosed at approximate age of 89 Her disease course has been stable. There are no hypoglycemic associated symptoms. Pertinent negatives for diabetes include no blurred vision, no fatigue, no polydipsia, no polyphagia and no polyuria. There are no hypoglycemic complications. Symptoms are stable. Diabetic complications include nephropathy and retinopathy. Risk factors for coronary artery disease include diabetes mellitus, dyslipidemia, obesity and sedentary lifestyle. Current diabetic treatment includes insulin injections. She is compliant with treatment all of the time. Her weight is increasing steadily. She is following a generally healthy diet. Meal planning includes avoidance of concentrated sweets. She has not had a previous visit with a dietitian. She participates in exercise intermittently. Her home blood glucose trend is fluctuating minimally. Her breakfast blood glucose range is generally 90-110 mg/dl. Her bedtime blood glucose range is generally 140-180 mg/dl. (She presents today with her meter and logs showing stable, at goal fasting and slightly above target postprandial glycemic profile.  Her POCT A1c today is 6.1%, increasing slightly from last visit of 5.7%.  She denies any significant hypoglycemia.  Says some of her higher evening readings are due to late suppers.)  An ACE inhibitor/angiotensin II receptor blocker is not being taken. She does not see a podiatrist.Eye exam is current.  Hyperlipidemia This is a chronic problem. The current episode started  more than 1 year ago. The problem is controlled. Recent lipid tests were reviewed and are normal. Exacerbating diseases include chronic renal disease, diabetes and obesity. There are no known factors aggravating her hyperlipidemia. Current antihyperlipidemic treatment includes statins. The current treatment provides moderate improvement of lipids. There are no compliance problems.  Risk factors for coronary artery disease include diabetes mellitus, dyslipidemia, obesity and a sedentary lifestyle.   Review of systems  Constitutional: + steadily increasing body weight,  current Body mass index is 33.08 kg/m. , no fatigue, no subjective hyperthermia, no subjective hypothermia Eyes: no blurry vision, no xerophthalmia ENT: no sore throat, no nodules palpated in throat, no dysphagia/odynophagia, no hoarseness Cardiovascular: no chest pain, no shortness of breath, no palpitations, no leg swelling Respiratory: no cough, no shortness of breath Gastrointestinal: no nausea/vomiting/diarrhea Musculoskeletal: chronic generalized pain- more Advil/Tylenol/Excedrine recently- sees pain specialist also Skin: no rashes, no hyperemia Neurological: no tremors, no numbness, no tingling, no dizziness Psychiatric: + depression- on meds, sees psychiatrist , no anxiety   Objective:    BP 129/83    Pulse 89    Ht 5' 11"  (1.803 m)    Wt 237 lb 3.2 oz (107.6 kg)    LMP 01/04/2018    SpO2 98%    BMI 33.08 kg/m   Wt Readings from Last 3 Encounters:  01/04/22 237 lb 3.2 oz (107.6 kg)  11/04/21 229 lb 3.2 oz (104 kg)  08/25/21 222 lb 3.2 oz (100.8 kg)    BP Readings from Last 3 Encounters:  01/04/22 129/83  11/04/21 (!) 141/81  08/25/21 94/62    Physical Exam- Limited  Constitutional:  Body mass index is 33.08 kg/m. , not in acute distress, normal state of mind Eyes:  EOMI, no exophthalmos Neck: Supple Cardiovascular: RRR, no murmurs, rubs, or gallops, no edema Respiratory: Adequate breathing efforts, no  crackles, rales, rhonchi, or wheezing Musculoskeletal: no gross deformities, strength intact in all four extremities, no gross restriction of joint movements Skin:  no rashes, no hyperemia Neurological: no tremor with outstretched hands   CMP ( most recent) CMP     Component Value Date/Time   NA 139 12/23/2021 0000   K 5.0 12/23/2021 0000   CL 100 12/23/2021 0000   CO2 21 12/23/2021 0000   GLUCOSE 119 (H) 07/03/2019 1230   BUN 20 12/23/2021 0000   CREATININE 1.2 (A) 12/23/2021 0000   CREATININE 1.00 07/03/2019 1230   CREATININE 0.88 02/21/2012 1153   CALCIUM 10.1 12/23/2021 0000   PROT 7.0 07/03/2019 1230   ALBUMIN 4.4 12/23/2021 0000   AST 43 (A) 12/23/2021 0000   ALT 39 (A) 12/23/2021 0000   ALKPHOS 83 12/23/2021 0000   BILITOT 0.9 07/03/2019 1230   GFRNONAA 44 12/29/2020 0000   GFRAA 50 12/29/2020 0000     Diabetic Labs (most recent): Lab Results  Component Value Date   HGBA1C 6.1 01/04/2022   HGBA1C 5.7 08/25/2021   HGBA1C 5.9 08/06/2021     Lipid Panel ( most recent) Lipid Panel     Component Value Date/Time   CHOL 196 08/06/2021 0000   TRIG 144 08/06/2021 0000   HDL 54 08/06/2021 0000   CHOLHDL 4.2 03/18/2015 0915   VLDL 44 (H) 03/18/2015 0915   LDLCALC 117 08/06/2021 0000      Lab Results  Component Value Date   TSH 2.69 12/29/2020   TSH 1.66 09/14/2020   TSH 2.770 08/29/2019   TSH 3.261 10/14/2012   TSH 2.651 02/21/2012   TSH 1.981 03/06/2009   TSH 3.250 01/23/2008           Assessment & Plan:   1) Type 2 diabetes mellitus with hyperglycemia, with long-term current use of insulin (HCC)  - Michelle Clements has currently uncontrolled symptomatic type 2 DM since 56 years of age.  She presents today with her meter and logs showing stable, at goal fasting and slightly above target postprandial glycemic profile.  Her POCT A1c today is 6.1%, increasing slightly from last visit of 5.7%.  She denies any significant hypoglycemia.  Says some of her  higher evening readings are due to late suppers.   Recent labs reviewed, showing stable CKD stage 3.  - I had a long discussion with her about the progressive nature of diabetes and the pathology behind its complications. -her diabetes is complicated by sleep apnea, HLD, retinopathy and she remains at a high risk for more acute and chronic complications which include CAD, CVA, CKD, retinopathy, and neuropathy. These are all discussed in detail with her.  - Nutritional counseling repeated at each appointment due to patients tendency to fall back in to old habits.  - The patient admits there is a room for improvement in their diet and drink choices. -  Suggestion is made for the patient to avoid simple carbohydrates from their diet including Cakes, Sweet Desserts / Pastries, Ice Cream, Soda (diet and regular), Sweet Tea, Candies, Chips, Cookies, Sweet Pastries, Store Bought Juices, Alcohol in Excess of 1-2 drinks a day, Artificial Sweeteners, Coffee Creamer, and "Sugar-free" Products. This will help patient to have stable blood glucose profile and potentially avoid unintended weight gain.   - I encouraged the patient to switch to unprocessed or minimally processed complex starch and increased protein intake (animal or plant source), fruits, and vegetables.   - Patient is advised to stick to a routine mealtimes to eat 3 meals a day and avoid unnecessary snacks (to snack only to correct hypoglycemia).  - she is following with Jearld Fenton, RDE for diabetes education.  - I have approached her with the following individualized plan to manage  her diabetes and patient agrees:   -Given her stable, controlled glycemic profile, no changes will be made to her medications today. She is advised to continue her Lantus 50 units SQ nightly.    -She is encouraged to continue monitoring blood glucose twice daily, before breakfast and before bed, and to call the clinic if she has readings less than 70 or  greater than 200 for 3 tests in a row.  - Specific targets for  A1c;  LDL, HDL,  and Triglycerides were discussed with the patient.  2) Blood Pressure /Hypertension:   her blood pressure is controlled to target without the use of antihypertensive medications.  She is advised to continue Lisinopril 5 mg po daily with breakfast.   3) Lipids/Hyperlipidemia:    Her most recent lipid panel from 05/04/21 shows LDL of 117 and improved triglycerides of 144.Marland Kitchen  She is advised to continue her Crestor 20 mg po daily at bedtime.  Side effects and precautions discussed with her.  4) Weight/Diet:  Her Body mass index is 33.08 kg/m.  -  clearly complicating her diabetes care.  She has lost approx 10 lbs and is advised to keep it up!  she is a  candidate for weight loss. I discussed with her the fact that loss of 5 - 10% of her  current body weight will have the most impact on her diabetes management.  Exercise, and detailed carbohydrates information provided  -  detailed on discharge instructions.  5) Chronic Care/Health Maintenance: -she is on Statin medications and is encouraged to initiate and continue to follow up with Ophthalmology, Dentist,  Podiatrist at least yearly or according to recommendations, and advised to stay away from smoking. I have recommended yearly flu vaccine and pneumonia vaccine at least every 5 years; moderate intensity exercise for up to 150 minutes weekly; and  sleep for at least 7 hours a day.  - she is advised to maintain close follow up with [name], MD for primary care needs, as well as her other providers for optimal and coordinated care.      I spent 30 minutes in the care of the patient today including review of labs from [facility], Lipids, Thyroid Function, Hematology (current and previous including abstractions from other facilities); face-to-face time discussing  her blood glucose readings/logs, discussing hypoglycemia and hyperglycemia episodes and symptoms, medications  doses, her options of short and long term treatment based on the latest standards of care / guidelines;  discussion about incorporating lifestyle medicine;  and documenting the encounter.    Please refer to Patient Instructions for Blood Glucose Monitoring and Insulin/Medications Dosing Guide"  in media tab for additional information. Please  also refer to " Patient Self Inventory" in the Media  tab for reviewed elements of pertinent patient history.  Michelle Clements participated in the discussions, expressed understanding, and voiced agreement with the above plans.  All questions were answered to her satisfaction. she is encouraged to contact clinic should she have any questions or concerns prior to her return visit.   Follow up plan: - Return in about 4 months (around 05/04/2022) for Diabetes F/U- A1c and UM in office, No previsit labs, Bring meter and logs.  Rayetta Pigg, [facility] [address] [place], [place] [address] Phone: [phone] Fax: [phone]  01/04/2022, 3:33 PM

## 2022-01-04 NOTE — Patient Instructions (Signed)

## 2022-01-11 ENCOUNTER — Ambulatory Visit (HOSPITAL_COMMUNITY): Payer: HMO | Admitting: Psychiatry

## 2022-01-11 DIAGNOSIS — D801 Nonfamilial hypogammaglobulinemia: Secondary | ICD-10-CM | POA: Diagnosis not present

## 2022-01-12 DIAGNOSIS — D801 Nonfamilial hypogammaglobulinemia: Secondary | ICD-10-CM | POA: Diagnosis not present

## 2022-01-13 ENCOUNTER — Telehealth (HOSPITAL_COMMUNITY): Payer: Self-pay | Admitting: Psychiatry

## 2022-01-13 DIAGNOSIS — F3181 Bipolar II disorder: Secondary | ICD-10-CM | POA: Diagnosis not present

## 2022-01-13 DIAGNOSIS — M797 Fibromyalgia: Secondary | ICD-10-CM | POA: Diagnosis not present

## 2022-01-13 DIAGNOSIS — I10 Essential (primary) hypertension: Secondary | ICD-10-CM | POA: Diagnosis not present

## 2022-01-13 NOTE — Telephone Encounter (Signed)
Called to schedule f/u appt left vm 

## 2022-01-14 DIAGNOSIS — J343 Hypertrophy of nasal turbinates: Secondary | ICD-10-CM | POA: Diagnosis not present

## 2022-01-14 DIAGNOSIS — K219 Gastro-esophageal reflux disease without esophagitis: Secondary | ICD-10-CM | POA: Diagnosis not present

## 2022-01-14 DIAGNOSIS — R490 Dysphonia: Secondary | ICD-10-CM | POA: Diagnosis not present

## 2022-01-14 DIAGNOSIS — J310 Chronic rhinitis: Secondary | ICD-10-CM | POA: Diagnosis not present

## 2022-01-19 ENCOUNTER — Encounter (HOSPITAL_COMMUNITY): Payer: Self-pay | Admitting: Psychiatry

## 2022-01-19 ENCOUNTER — Other Ambulatory Visit: Payer: Self-pay

## 2022-01-19 ENCOUNTER — Telehealth (INDEPENDENT_AMBULATORY_CARE_PROVIDER_SITE_OTHER): Payer: HMO | Admitting: Psychiatry

## 2022-01-19 DIAGNOSIS — F332 Major depressive disorder, recurrent severe without psychotic features: Secondary | ICD-10-CM | POA: Diagnosis not present

## 2022-01-19 DIAGNOSIS — F319 Bipolar disorder, unspecified: Secondary | ICD-10-CM | POA: Diagnosis not present

## 2022-01-19 MED ORDER — FLUOXETINE HCL 40 MG PO CAPS: 40.0000 mg | ORAL_CAPSULE | Freq: Every day | ORAL | 2 refills | Status: DC

## 2022-01-19 MED ORDER — ESZOPICLONE 3 MG PO TABS: 3.0000 mg | ORAL_TABLET | Freq: Every evening | ORAL | 2 refills | Status: DC | PRN

## 2022-01-19 MED ORDER — CLONAZEPAM 0.5 MG PO TABS: 0.5000 mg | ORAL_TABLET | Freq: Two times a day (BID) | ORAL | 2 refills | Status: DC

## 2022-01-19 NOTE — Progress Notes (Signed)
Virtual Visit via Video Note  I connected with Michelle Clements on 01/19/22 at 10:40 AM EST by a video enabled telemedicine application and verified that I am speaking with the correct person using two identifiers.  Location: Patient: home Provider: office   I discussed the limitations of evaluation and management by telemedicine and the availability of in person appointments. The patient expressed understanding and agreed to proceed.     I discussed the assessment and treatment plan with the patient. The patient was provided an opportunity to ask questions and all were answered. The patient agreed with the plan and demonstrated an understanding of the instructions.   The patient was advised to call back or seek an in-person evaluation if the symptoms worsen or if the condition fails to improve as anticipated.  I provided 15 minutes of non-face-to-face time during this encounter.   [name], MD  [facility] MD/PA/NP OP Progress Note  01/19/2022 11:13 AM Michelle Clements  MRN:  [id number]  Chief Complaint:  Chief Complaint  Patient presents with   Depression   Anxiety   Follow-up   HPI: This patient is a 56year-old married white female who lives with her husband in [place]. She has two daughters  who live out of the home. 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  For the most part she is doing fairly well.  She denies thoughts of self-harm or suicidal ideation.  She still is having some trouble sleeping with the Lunesta but does not want to change it yet.  She stopped the Wellbutrin because she thinks it was causing headaches.  The nurse practitioner for her neurologist recently prescribed Cymbalta but she is already on fluoxetine and the 2 together made her feel edgy and weird so she is stopped the Cymbalta.  I explained that we cannot take these 2 together because it can cause serotonin syndrome.  The clonazepam continues to help with  her anxiety.  Her chronic pain is better since she had received injections in her back. Visit Diagnosis:    ICD-10-CM   1. Bipolar 1 disorder (HCC)  F31.9     2. Major depressive disorder, recurrent, severe without psychotic features ([place])  F33.2       Past Psychiatric History: Recent admission for overdose/suicide attempt followed by partial hospital program.  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[place])    Depression    Essential hypertension, benign    Fibromyalgia    GERD (gastroesophageal reflux disease)    H/O sleep apnea    Hip pain, left    History of palpitations    Negative Holter monitor   History of pneumonia 1988   Hyperlipidemia    IBS (irritable bowel syndrome)    Neck pain    Poor short term memory    PTSD (post-traumatic stress disorder)    S/P Botox injection 11/2014    For migraine headaches   Type 2 diabetes mellitus ([place])    Urgency of urination     Past Surgical History:  Procedure Laterality Date   CARPAL TUNNEL RELEASE Right  06/10/2013   Procedure: CARPAL TUNNEL RELEASE;  Surgeon: Faythe Ghee, MD;  Location: [facility];  Service: Neurosurgery;  Laterality: Right;  Right Carpal Tunnel Release    CHOLECYSTECTOMY     DENTAL SURGERY  12/2014   mole exc     x 3   MUSCLE BIOPSY  2008   Right leg   TUBAL LIGATION      Family Psychiatric History: see below  Family History:  Family History  Problem Relation Age of Onset   Fibromyalgia Mother    Diabetes type II Mother    Depression Mother    Alzheimer's disease Mother    GER disease Mother    Heart disease Mother    Paranoid behavior Mother    Dementia Mother    Heart attack Father 9       MI x5   Stroke Father         CVA x7   Asthma Father    Brain cancer Father    Seizures Father    Anxiety disorder Sister    OCD Sister    Sexual abuse Sister    Physical abuse Sister    Anxiety disorder Sister    ADD / ADHD Daughter    ADD / ADHD Daughter    Alcohol abuse Neg Hx    Drug abuse Neg Hx    Schizophrenia Neg Hx     Social History:  Social History   Socioeconomic History   Marital status: Married    Spouse name: Not on file   Number of children: 2   Years of education: College   Highest education level: Associate degree: academic program  Occupational History   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   Caffeine use: 2 cups coffee in morning and 2 cups tea during the day    Social Determinants of Health   Financial Resource Strain: Low Risk    Difficulty of Paying Living Expenses: Not very hard  Food Insecurity: No Food Insecurity   Worried About Charity fundraiser in the Last Year: Never true   Ran Out of Food in the Last Year: Never true  Transportation Needs: No Transportation Needs   Lack of Transportation (Medical): No   Lack of Transportation (Non-Medical): No  Physical Activity: Inactive   Days of Exercise per Week: 0 days   Minutes of Exercise per Session: 0 min  Stress: No Stress Concern Present   Feeling of Stress : Not at all  Social Connections: Moderately Integrated   Frequency of Communication with Friends and Family: More than three  times a week   Frequency of Social Gatherings with Friends and Family: More than three times a week   Attends Religious Services: More than 4 times per year   Active Member of Clubs or Organizations: No  Attends Archivist Meetings: Never   Marital Status: Married    Allergies:  Allergies  Allergen Reactions   Molds & Smuts Shortness Of Breath    wheezing   Other Anaphylaxis, Shortness Of Breath and Other (See Comments)     : Angioedema wheezing Wheezing wheezing Other reaction(s): Cough Wheezing Itching and rash Wheezing wheezing Wheezing wheezing Other reaction(s): Other (See Comments)  : Angioedema wheezing Wheezing wheezing Other reaction(s): Cough Wheezing Itching and rash Wheezing wheezing Other reaction(s): Other (See Comments) Wheezing wheezing  : Angioedema wheezing Wheezing wheezing Other reaction(s): Cough Wheezing Itching and rash Wheezing wheezing Wheezing wheezing   Sulfa Antibiotics Anaphylaxis     : Angioedema    Sulfonamide Derivatives Anaphylaxis     : Angioedema   Trichophyton Shortness Of Breath    _0  wheezing wheezing wheezing wheezing   Carbamazepine Rash   Dust Mite Extract Cough and Other (See Comments)    Wheezing Other reaction(s): Wheezing Wheezing Wheezing Other reaction(s): Wheezing Wheezing Wheezing Other reaction(s): Wheezing Wheezing   Gluten Meal Itching   Clonazepam Other (See Comments)    Confusion and memory loss Caused nconfusion    Metabolic Disorder Labs: Lab Results  Component Value Date   HGBA1C 6.1 01/04/2022   MPG 131 03/18/2015   No results found for: PROLACTIN Lab Results  Component Value Date   CHOL 196 08/06/2021   TRIG 144 08/06/2021   HDL 54 08/06/2021   CHOLHDL 4.2 03/18/2015   VLDL 44 (H) 03/18/2015   LDLCALC 117 08/06/2021   LDLCALC 64 12/29/2020   Lab Results  Component Value Date   TSH 2.69 12/29/2020   TSH 1.66  09/14/2020    Therapeutic Level Labs: Lab Results  Component Value Date   LITHIUM 0.20 (L) 02/21/2012   No results found for: VALPROATE No components found for:  CBMZ  Current Medications: Current Outpatient Medications  Medication Sig Dispense Refill   albuterol (VENTOLIN HFA) 108 (90 Base) MCG/ACT inhaler albuterol sulfate HFA 90 mcg/actuation aerosol inhaler  INHALE TWO PUFFS BY MOUTH INTO LUNGS EVERY 6 HOURS AS NEEDED     Ascorbic Acid (VITAMIN C) 1000 MG tablet Take 1,000 mg by mouth daily.     Blood Glucose Monitoring Suppl ([place]) w/Device KIT See admin instructions.     cetirizine (ZYRTEC) 10 MG tablet Take 10 mg by mouth at bedtime.     Cholecalciferol (VITAMIN D) 50 MCG (2000 UT) tablet Take 2,000 Units by mouth daily.     clonazePAM (KLONOPIN) 0.5 MG tablet Take 1 tablet (0.5 mg total) by mouth 2 (two) times daily. 60 tablet 2   cycloSPORINE (RESTASIS) 0.05 % ophthalmic emulsion Apply to eye.     estradiol (ESTRACE) 0.1 MG/GM vaginal cream Place vaginally.     eszopiclone 3 MG TABS Take 1 tablet (3 mg total) by mouth at bedtime as needed. Take immediately before bedtime 30 tablet 2   FLUoxetine (PROZAC) 40 MG capsule Take 1 capsule (40 mg total) by mouth daily. 30 capsule 2   fluticasone (FLONASE) 50 MCG/ACT nasal spray Place 1 spray into both nostrils daily.     GAMUNEX-C 20 GM/200ML SOLN      Glucos-Chond-Hyal Ac-Ca Fructo (MOVE FREE JOINT HEALTH ADVANCE PO) Take by mouth.     ibuprofen (ADVIL) 800 MG tablet SMARTSIG:1 Tablet(s) By Mouth Morning-Night     insulin glargine (LANTUS SOLOSTAR) 100 UNIT/ML Solostar Pen Inject 50 Units into the skin daily.  Lancets (ONETOUCH ULTRASOFT) lancets 1 each by Other route as needed.      lidocaine (LIDODERM) 5 % lidocaine 5 % topical patch  APPLY 1 PATCH BY TOPICAL ROUTE ONCE DAILY (MAY WEAR UP TO 12HOURS.)     lisinopril (ZESTRIL) 5 MG tablet lisinopril 5 mg tablet  Take 1 tablet every day by oral route.      Multiple Vitamin (MULTIVITAMIN) tablet Take 1 tablet by mouth daily.     omeprazole (PRILOSEC) 20 MG capsule Take 20 mg by mouth daily.     ONETOUCH VERIO test strip 4 (four) times daily.     pregabalin (LYRICA) 200 MG capsule Take 200 mg by mouth 3 (three) times daily.     rosuvastatin (CRESTOR) 20 MG tablet Take 1 tablet (20 mg total) by mouth at bedtime. 90 tablet 3   sennosides-docusate sodium (SENOKOT-S) 8.6-50 MG tablet Take 1 tablet by mouth daily.     tiZANidine (ZANAFLEX) 4 MG tablet Take 4 mg by mouth daily. 46m at bedtime and 2 mg during the day     traMADol (ULTRAM) 50 MG tablet Take 100 mg by mouth every 8 (eight) hours as needed.     zinc gluconate 50 MG tablet Take 50 mg by mouth daily.     No current facility-administered medications for this visit.     Musculoskeletal: Strength & Muscle Tone: within normal limits Gait & Station: normal Patient leans: N/A  Psychiatric Specialty Exam: Review of Systems  Musculoskeletal:  Positive for arthralgias, back pain and myalgias.  Psychiatric/Behavioral:  Positive for sleep disturbance.   All other systems reviewed and are negative.  Last menstrual period 01/04/2018.There is no height or weight on file to calculate BMI.  General Appearance: Casual and Fairly Groomed  Eye Contact:  Good  Speech:  Clear and Coherent  Volume:  Normal  Mood:  Euthymic  Affect:  Appropriate and Congruent  Thought Process:  Goal Directed  Orientation:  Full (Time, Place, and Person)  Thought Content: WDL   Suicidal Thoughts:  No  Homicidal Thoughts:  No  Memory:  Immediate;   Good Recent;   Good Remote;   Good  Judgement:  Good  Insight:  Good  Psychomotor Activity:  Decreased  Concentration:  Concentration: Good and Attention Span: Good  Recall:  Good  Fund of Knowledge: Good  Language: Good  Akathisia:  No  Handed:  Right  AIMS (if indicated): not done  Assets:  Communication Skills Desire for Improvement Resilience Social  Support Talents/Skills  ADL's:  Intact  Cognition: WNL  Sleep:  Poor   Screenings: Mini-Mental    [place]  Total Score (max 30 points ) 27      PHQ2-9    Flowsheet Row Video [facility]2022 [facility]20[facility] [facility] Visit from [facility]2021 [facility] [facility] Visit from 10/26/2021 [facility] Video Visit from 10/06/2021 in [facility]  PHQ-2 Total Score 0 2 0 0 0  PHQ-9 Total Score 4 10 -- -- --      Flowsheet Row Video Visit from 01/19/2022 in [facility] Video Visit from 11/22/2021 in [facility] Video Visit from 10/26/2021 in [facility]: Q3, 4, or 5 should not be populated when Q2 is No Error:  Q3, 4, or 5 should not be populated when Q2 is No Error: Q3, 4, or 5 should not be populated when Q2 is No        Assessment and Plan: This patient is a 56 year old female with a history of PTSD depression anxiety hallucinations and nightmares.  For the most part she is doing better.  She is not sleeping as well as she would like but she does not want to switch her medications yet.  She is going to try getting a little bit more exercise through the day.  She will continue Lunesta 3 mg at bedtime for sleep, clonazepam 0.5 mg twice daily for anxiety and Prozac 40 mg daily for depression.  She will return to see me in 2 months  Collaboration of Care: Collaboration of Care: Primary Care Provider AEB sharing of chart notes if requested by patient.  Patient/Guardian was advised Release of Information must be obtained prior to  any record release in order to collaborate their care with an outside provider. Patient/Guardian was advised if they have not already done so to contact the registration department to sign all necessary forms in order for [place] to release information regarding their care.   Consent: Patient/Guardian gives verbal consent for treatment and assignment of benefits for services provided during this visit. Patient/Guardian expressed understanding and agreed to proceed.    [name], MD 01/19/2022, 11:13 AM

## 2022-01-20 DIAGNOSIS — M5416 Radiculopathy, lumbar region: Secondary | ICD-10-CM | POA: Diagnosis not present

## 2022-01-25 ENCOUNTER — Other Ambulatory Visit: Payer: Self-pay

## 2022-01-25 ENCOUNTER — Ambulatory Visit (INDEPENDENT_AMBULATORY_CARE_PROVIDER_SITE_OTHER): Payer: HMO | Admitting: Psychiatry

## 2022-01-25 DIAGNOSIS — F319 Bipolar disorder, unspecified: Secondary | ICD-10-CM | POA: Diagnosis not present
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
## 2022-01-27 DIAGNOSIS — L255 Unspecified contact dermatitis due to plants, except food: Secondary | ICD-10-CM | POA: Diagnosis not present

## 2022-02-01 DIAGNOSIS — E119 Type 2 diabetes mellitus without complications: Secondary | ICD-10-CM | POA: Diagnosis not present

## 2022-02-01 DIAGNOSIS — H18513 Endothelial corneal dystrophy, bilateral: Secondary | ICD-10-CM | POA: Diagnosis not present

## 2022-02-01 DIAGNOSIS — R51 Headache with orthostatic component, not elsewhere classified: Secondary | ICD-10-CM | POA: Diagnosis not present

## 2022-02-01 DIAGNOSIS — H04123 Dry eye syndrome of bilateral lacrimal glands: Secondary | ICD-10-CM | POA: Diagnosis not present

## 2022-02-01 DIAGNOSIS — K219 Gastro-esophageal reflux disease without esophagitis: Secondary | ICD-10-CM | POA: Diagnosis not present

## 2022-02-01 DIAGNOSIS — E1165 Type 2 diabetes mellitus with hyperglycemia: Secondary | ICD-10-CM | POA: Diagnosis not present

## 2022-02-01 NOTE — Progress Notes (Signed)
[facility] [address], [place] [address]   CLINIC:  Medical Oncology/Hematology  PCP:  [name], MD [place] [address] [phone]   REASON FOR VISIT:  Follow-up for hypogammaglobulinemia   CURRENT THERAPY: IVIG  INTERVAL HISTORY:  Michelle Clements 56 y.o. 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  She was last seen by Tarri Abernethy PA-C on 11/04/2021.  At today's visit, she reports feeling fair.    She has required antibiotic treatment of upper respiratory infection once in the past 3 months, about 3 weeks ago.  She does continue to have some ongoing chest and throat tightness and wheezing that she attributes to her underlying asthma. She reports that her sinus pressure and sinus drainage has improved. She denies any skin issues or rashes, apart from recent poison ivy that was treated with prednisone. She denies any dysuria or lower urinary tract symptoms. She has previously had intermittent diarrhea from her IBS, but this is currently improved. No changes in her bowel or bladder habits. No B symptoms such as fever, chills, night sweats, unintentional weight loss.   She reports that her generalized body pain and fibromyalgia pains are slightly improved after being back on IVIG.  She has been receiving her monthly IVIG from home health, and reports that she has been having more frequent headaches.  She reports onset of headaches in September 2022, which is the same month that her mother passed away.  Of note, she did not receive her first IVIG infusion until November 2022.  She reports that the headaches are constant - she always has at least a dull headache but with fluctuating severity that results in the severe headache associated with photophobia and nausea every other day.  She does not note any association between her headaches and the timing of her IVIG.  She has chronic unchanged neck pain from bone  spurs and vertebral arthritis, which is no worse than usual.  She denies any symptoms of meningeal irritation or altered mental status.  No associated fever, chills, muscle spasms, or body aches.  She has had some work-up for her headaches already by ENT, ophthalmology, and PCP, but without any definitive cause of her headaches being found.  She has been referred to neurology, but has not yet seen them.  Otherwise, patient is tolerating her IVIG infusions well.  She make sure that she hydrates to the point that she has clear urine.  She is currently receiving premedication with Tylenol and Benadryl.  She has 50% energy and 75% appetite. She endorses that she is maintaining a stable weight.   REVIEW OF SYSTEMS:  Review of Systems  Constitutional:  Positive for fatigue. Negative for appetite change, chills, diaphoresis, fever and unexpected weight change.  HENT:   Negative for lump/mass and nosebleeds.   Eyes:  Negative for eye problems.  Respiratory:  Positive for chest tightness, shortness of breath and wheezing (asthma). Negative for cough and hemoptysis.   Cardiovascular:  Negative for chest pain, leg swelling and palpitations.  Gastrointestinal:  Positive for nausea. Negative for abdominal pain, blood in stool, constipation, diarrhea and vomiting.  Genitourinary:  Negative for hematuria.   Skin: Negative.   Neurological:  Positive for headaches (daily). Negative for dizziness and light-headedness.  Hematological:  Does not bruise/bleed easily.  Psychiatric/Behavioral:  Positive for sleep disturbance.      PAST MEDICAL/SURGICAL HISTORY:  Past Medical History:  Diagnosis Date   Allergic rhinitis  Anemia    Arthritis    Lower back and hips   Asthma    Bipolar disorder (HCC)    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HCC)    Constipation    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HCC)    Depression    Essential hypertension, benign    Fibromyalgia    GERD (gastroesophageal reflux  disease)    H/O sleep apnea    Hip pain, left    History of palpitations    Negative Holter monitor   History of pneumonia 1988   Hyperlipidemia    IBS (irritable bowel syndrome)    Neck pain    Poor short term memory    PTSD (post-traumatic stress disorder)    S/P Botox injection 11/2014    For migraine headaches   Type 2 diabetes mellitus ([place])    Urgency of urination    Past Surgical History:  Procedure Laterality Date   CARPAL TUNNEL RELEASE Right 06/10/2013   Procedure: CARPAL TUNNEL RELEASE;  Surgeon: Faythe Ghee, MD;  Location: [facility];  Service: Neurosurgery;  Laterality: Right;  Right Carpal Tunnel Release    CHOLECYSTECTOMY     DENTAL SURGERY  12/2014   mole exc     x 3   MUSCLE BIOPSY  2008   Right leg   TUBAL LIGATION       SOCIAL HISTORY:  Social History   Socioeconomic History   Marital status: Married    Spouse name: Not on file   Number of children: 2   Years of education: College   Highest education level: Associate degree: academic program  Occupational History   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   Caffeine use: 2 cups coffee in morning and 2 cups tea during the day    Social Determinants of Health   Financial Resource Strain: Low Risk    Difficulty of Paying Living Expenses: Not very hard  Food Insecurity: No Food Insecurity   Worried About Charity fundraiser in the Last Year: Never true   Ran Out  of Food in the Last Year: Never true  Transportation Needs: No Transportation Needs   Lack of Transportation (Medical): No   Lack of Transportation (Non-Medical): No  Physical Activity: Inactive   Days of Exercise per Week: 0 days   Minutes of Exercise per Session: 0 min  Stress: No Stress Concern Present   Feeling of Stress : Not at all  Social Connections: Moderately Integrated   Frequency of Communication with Friends and Family: More than three times a week   Frequency of Social Gatherings with Friends and Family: More than three times a week   Attends Religious Services: More than 4 times per year   Active Member of [employer] or Organizations: No   Attends Archivist Meetings: Never   Marital  Status: Married  Human resources officer Violence: Not At Risk   Fear of Current or Ex-Partner: No   Emotionally Abused: No   Physically Abused: No   Sexually Abused: No    FAMILY HISTORY:  Family History  Problem Relation Age of Onset   Fibromyalgia Mother    Diabetes type II Mother    Depression Mother    Alzheimer's disease Mother    GER disease Mother    Heart disease Mother    Paranoid behavior Mother    Dementia Mother    Heart attack Father 61       MI x5   Stroke Father        CVA x7   Asthma Father    Brain cancer Father    Seizures Father    Anxiety disorder Sister    OCD Sister    Sexual abuse Sister    Physical abuse Sister    Anxiety disorder Sister    ADD / ADHD Daughter    ADD / ADHD Daughter    Alcohol abuse Neg Hx    Drug abuse Neg Hx    Schizophrenia Neg Hx     CURRENT MEDICATIONS:  Outpatient Encounter Medications as of 02/02/2022  Medication Sig Note   albuterol (VENTOLIN HFA) 108 (90 Base) MCG/ACT inhaler albuterol sulfate HFA 90 mcg/actuation aerosol inhaler  INHALE TWO PUFFS BY MOUTH INTO LUNGS EVERY 6 HOURS AS NEEDED    Ascorbic Acid (VITAMIN C) 1000 MG tablet Take 1,000 mg by mouth daily.    Blood Glucose Monitoring Suppl ([place]) w/Device KIT See admin instructions.    cetirizine (ZYRTEC) 10 MG tablet Take 10 mg by mouth at bedtime.    Cholecalciferol (VITAMIN D) 50 MCG (2000 UT) tablet Take 2,000 Units by mouth daily.    clonazePAM (KLONOPIN) 0.5 MG tablet Take 1 tablet (0.5 mg total) by mouth 2 (two) times daily.    cycloSPORINE (RESTASIS) 0.05 % ophthalmic emulsion Apply to eye.    estradiol (ESTRACE) 0.1 MG/GM vaginal cream Place vaginally.    eszopiclone 3 MG TABS Take 1 tablet (3 mg total) by mouth at bedtime as needed. Take immediately before bedtime    FLUoxetine (PROZAC) 40 MG capsule Take 1 capsule (40 mg total) by mouth daily.    fluticasone (FLONASE) 50 MCG/ACT nasal spray Place 1 spray into both nostrils daily.    GAMUNEX-C 20 GM/200ML SOLN     Glucos-Chond-Hyal Ac-Ca Fructo (MOVE FREE JOINT HEALTH ADVANCE PO) Take by mouth.    ibuprofen (ADVIL) 800 MG tablet SMARTSIG:1 Tablet(s) By Mouth Morning-Night    insulin glargine (LANTUS SOLOSTAR) 100 UNIT/ML Solostar Pen Inject 50 Units into the skin daily.    Lancets (ONETOUCH ULTRASOFT) lancets 1 each by Other route as needed.  04/25/2014: Received from: External Pharmacy   lidocaine (LIDODERM) 5 % lidocaine 5 % topical patch  APPLY 1 PATCH BY TOPICAL ROUTE ONCE DAILY (MAY WEAR UP TO 12HOURS.)    lisinopril (ZESTRIL) 5 MG tablet lisinopril 5 mg tablet  Take 1 tablet every day by oral route.    Multiple Vitamin (MULTIVITAMIN) tablet Take 1 tablet by mouth daily.    omeprazole (PRILOSEC) 20 MG capsule Take 20 mg by mouth daily.    ONETOUCH VERIO test strip 4 (four) times daily.    pregabalin (LYRICA) 200 MG capsule Take 200 mg by mouth 3 (three) times daily.    rosuvastatin (CRESTOR) 20 MG tablet Take 1 tablet (20 mg total) by mouth at  bedtime.    sennosides-docusate sodium (SENOKOT-S) 8.6-50 MG tablet Take 1 tablet by mouth daily.    tiZANidine (ZANAFLEX) 4 MG tablet Take 4 mg by mouth daily. 55m at bedtime and 2 mg during the day 03/12/2015: Received  from: External Pharmacy Received Sig:    traMADol (ULTRAM) 50 MG tablet Take 100 mg by mouth every 8 (eight) hours as needed.    zinc gluconate 50 MG tablet Take 50 mg by mouth daily.    No facility-administered encounter medications on file as of 02/02/2022.    ALLERGIES:  Allergies  Allergen Reactions   Molds & Smuts Shortness Of Breath    wheezing   Other Anaphylaxis, Shortness Of Breath and Other (See Comments)     : Angioedema wheezing Wheezing wheezing Other reaction(s): Cough Wheezing Itching and rash Wheezing wheezing Wheezing wheezing Other reaction(s): Other (See Comments)  : Angioedema wheezing Wheezing wheezing Other reaction(s): Cough Wheezing Itching and rash Wheezing wheezing Other reaction(s): Other (See Comments) Wheezing wheezing  : Angioedema wheezing Wheezing wheezing Other reaction(s): Cough Wheezing Itching and rash Wheezing wheezing Wheezing wheezing   Sulfa Antibiotics Anaphylaxis     : Angioedema    Sulfonamide Derivatives Anaphylaxis     : Angioedema   Trichophyton Shortness Of Breath    wheezing wheezing wheezing wheezing wheezing wheezing wheezing wheezing wheezing   Carbamazepine Rash   Dust Mite Extract Cough and Other (See Comments)    Wheezing Other reaction(s): Wheezing Wheezing Wheezing Other reaction(s): Wheezing Wheezing Wheezing Other reaction(s): Wheezing Wheezing   Gluten Meal Itching   Clonazepam Other (See Comments)    Confusion and memory loss Caused nconfusion     PHYSICAL EXAM:  ECOG PERFORMANCE STATUS: 1 - Symptomatic but completely ambulatory  There were no vitals filed for this visit. There were no vitals filed for this visit. Physical Exam Constitutional:      Appearance: Normal appearance. She is obese.  HENT:     Head: Normocephalic and atraumatic.     Mouth/Throat:     Mouth: Mucous membranes are moist.  Eyes:     Extraocular Movements: Extraocular movements intact.      Pupils: Pupils are equal, round, and reactive to light.  Cardiovascular:     Rate and Rhythm: Normal rate and regular rhythm.     Pulses: Normal pulses.     Heart sounds: Normal heart sounds.  Pulmonary:     Effort: Pulmonary effort is normal.     Breath sounds: Wheezing (faint wheeze over right upper anterior lung fireld) present.  Abdominal:     General: Bowel sounds are normal.     Palpations: Abdomen is soft.     Tenderness: There is no abdominal tenderness.  Musculoskeletal:        General: No swelling.     Right lower leg: No edema.     Left lower leg: No edema.  Lymphadenopathy:     Cervical: No cervical adenopathy.  Skin:    General: Skin is warm and dry.  Neurological:     General: No focal deficit present.     Mental Status: She is alert and oriented to person, place, and time.  Psychiatric:        Mood and Affect: Mood normal.        Behavior: Behavior normal.     LABORATORY DATA:  I have reviewed the labs as listed.  CBC    Component Value Date/Time   WBC 9.0 07/03/2019 1230   RBC 4.64 07/03/2019 1230  HGB 14.7 07/03/2019 1230   HCT 42.8 07/03/2019 1230   PLT 288 07/03/2019 1230   MCV 92.2 07/03/2019 1230   MCH 31.7 07/03/2019 1230   MCHC 34.3 07/03/2019 1230   RDW 12.4 07/03/2019 1230   LYMPHSABS 2.2 07/03/2019 1230   MONOABS 0.5 07/03/2019 1230   EOSABS 0.3 07/03/2019 1230   BASOSABS 0.1 07/03/2019 1230   CMP Latest Ref Rng & Units 12/23/2021 08/06/2021 04/14/2021  Glucose 70 - 99 mg/dL - - -  BUN 4 - 21 20 16  27(A)  Creatinine 0.5 - 1.1 1.2(A) 1.4(A) 1.4(A)  Sodium 137 - 147 139 143 142  Potassium 3.4 - 5.3 5.0 5.0 4.0  Chloride 99 - 108 100 102 100  CO2 13 - 22 21 26(A) 27(A)  Calcium 8.7 - 10.7 10.1 10.8(A) 10.0  Total Protein 6.5 - 8.1 g/dL - - -  Total Bilirubin 0.3 - 1.2 mg/dL - - -  Alkaline Phos 25 - 125 83 78 66  AST 13 - 35 43(A) 28 56(A)  ALT 7 - 35 39(A) 28 108(A)    DIAGNOSTIC IMAGING:  I have independently reviewed the relevant  imaging and discussed with the patient.  ASSESSMENT & PLAN: 1.  Hypogammaglobulinemia with recurrent infections - Labs from October 2015 showed IgG 566 [phone]), IgM 28 (57-237), IgA 65, IgE 12 - Evaluated by [facility] by Dr. Lloyd Huger (10/27/2014), who noted: " A review of Ms. Huertas immune evaluation reveals mild hypogammaglobulinemia (not significant), a low IgM but normal specific antibody titers, normal B cell subsets panels and normal cellular function. Taken together the immune evaluation is normal. I do not believe Ms. Sarria has a functional disorder of her immune system. For the hypo IgM this may become significant if the IgM was less that 15. For this I suggest she have an annual IgG, IgM and IgA. Currently the IgM level is not likely to be significant". - She previously received IVIG, but this was discontinued in August 2016 due to financial reasons - She was previously seen by Dr. Delton Coombes at our clinic, but was lost to follow-up after her appointment on 07/10/2019. - She returned to reestablish care and saw Dr. Lorenso Courier on 07/27/2021. 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 Lorenso Courier, with first dose on 11/03/2021 - She reports that she has been tolerating her IVIG infusions well, no reported adverse events or systemic reactions.   - Over the last 3 months, she has required 1 course of antibiotics for URI - No B symptoms such as fever, chills, night sweats, unintentional weight loss - Orders had been sent to home health to check quantitative immunoglobulins prior to her most recent IVIG, but unfortunately these were not checked. - Patient receives her home IVIG infusions via [facility] (contact Mali Sandy at [phone], fax [phone], email [email or web address]) - PLAN:  Continue IVIG q. 28 days (via [facility] home [facility] infusion) with every 20-monthclinic visit follow-up. - We will check quantitative immunoglobulins 1 to 2 days before each IVIG treatment.  Therapeutic goal is IgG around 600. - Check CBC and CMP with home health lab draw in 3 months.  2.  Headaches - Headaches described in HPI above are unlikely to be caused by IVIG, since they began prior to when she started on IVIG and did not show any temporal  relationship to her IVIG infusions. - PLAN: Although her headache pattern seems unlikely to be caused by IVIG, recommend to slow rate of IVIG infusion and premedicate with 500 mL NS to see if this improves any of her headaches. - She has also been referred to neurology for work-up of her headaches  3.  Other history - Her past medical history is otherwise notable for fatty liver disease with history of transaminitis, GERD, fibromyalgia, type 2 diabetes mellitus, and IBS   PLAN SUMMARY & DISPOSITION: Monthly IVIG infusions via home health Monthly lab check (quantitative immunoglobulins) via home health Check CBC and CMP in 3 months (home health lab draw) Office visit in 3 months  All questions were answered. The patient knows to call the clinic with any problems, questions or concerns.  Medical decision making: Moderate  Time spent on visit: I spent 20 minutes counseling the patient face to face. The total time spent in the appointment was 30 minutes and more than 50% was on counseling.   Harriett Rush, PA-C  02/02/2022 2:08 PM

## 2022-02-02 ENCOUNTER — Telehealth (HOSPITAL_COMMUNITY): Payer: Self-pay | Admitting: *Deleted

## 2022-02-02 ENCOUNTER — Other Ambulatory Visit: Payer: Self-pay

## 2022-02-02 ENCOUNTER — Inpatient Hospital Stay (HOSPITAL_COMMUNITY): Payer: HMO | Attending: Hematology | Admitting: Physician Assistant

## 2022-02-02 VITALS — BP 129/81 | HR 85 | Temp 97.7°F | Resp 18 | Ht 71.0 in | Wt 234.5 lb

## 2022-02-02 DIAGNOSIS — D801 Nonfamilial hypogammaglobulinemia: Secondary | ICD-10-CM | POA: Diagnosis not present

## 2022-02-02 DIAGNOSIS — R519 Headache, unspecified: Secondary | ICD-10-CM | POA: Insufficient documentation

## 2022-02-02 DIAGNOSIS — D839 Common variable immunodeficiency, unspecified: Secondary | ICD-10-CM | POA: Diagnosis not present

## 2022-02-02 DIAGNOSIS — Z79899 Other long term (current) drug therapy: Secondary | ICD-10-CM | POA: Insufficient documentation

## 2022-02-02 DIAGNOSIS — R062 Wheezing: Secondary | ICD-10-CM | POA: Insufficient documentation

## 2022-02-02 NOTE — Patient Instructions (Signed)
[facility] at [facility] ?Discharge Instructions ? ?You were seen today by Tarri Abernethy PA-C for your immune deficiency (hypogammaglobulinemia) and recurrent infections. ? ?- We will continue IVIG infusions once per month via your home health agency.   ? ?- Your home health agency should be checking your immunoglobulin levels before EACH of your IVIG infusions. ? ?- At your lab check in 3 months, home health also needs to check your CBC and CMP.  If these levels are not checked by home health, we will have them added to your next appointment. ? ?- Your headaches do not fit the typical pattern of IVIG-induced headaches.  They may be due to stress or some other condition.  You have already been referred to see neurology.  I suggest that you follow-up with them regarding the cause of your headaches. ? ?- We will however speak with home health and have them give you IV fluids prior to your IVIG.  We will also ask them to slow the rate of your IVIG infusions.  We will hold off on giving any additional medications such as steroids at this time. ? ?- Speak with your primary care provider regarding your other chronic conditions, especially your asthma and chest tightness. ? ?MEDICATIONS: Continue IVIG every 4 weeks ? ?FOLLOW-UP APPOINTMENT: Office visit in 3 months ? ? ?Thank you for choosing [facility] at [facility] to provide your oncology and hematology care.  To afford each patient quality time with our provider, please arrive at least 15 minutes before your scheduled appointment time.  ? ?If you have a lab appointment with the [facility] please come in thru the Main Entrance and check in at the main information desk. ? ?You need to re-schedule your appointment should you arrive 10 or more minutes late.  We strive to give you quality time with our providers, and arriving late affects you and other patients whose appointments are after yours.  Also, if you no show  three or more times for appointments you may be dismissed from the clinic at the providers discretion.     ?Again, thank you for choosing [facility].  Our hope is that these requests will decrease the amount of time that you wait before being seen by our physicians.       ?_____________________________________________________________ ? ?Should you have questions after your visit to [facility], please contact our office at [phone] and follow the prompts.  Our office hours are 8:00 a.m. and 4:30 p.m. Monday - Friday.  Please note that voicemails left after 4:00 p.m. may not be returned until the following business day.  We are closed weekends and major holidays.  You do have access to a nurse 24-7, just call the main number to the clinic [phone] and do not press any options, hold on the line and a nurse will answer the phone.   ? ?For prescription refill requests, have your pharmacy contact our office and allow 72 hours.   ? ?Due to Covid, you will need to wear a mask upon entering the hospital. If you do not have a mask, a mask will be given to you at the Main Entrance upon arrival. For doctor visits, patients may have 1 support person age 55 or older with them. For treatment visits, patients can not have anyone with them due to social distancing guidelines and our immunocompromised population.  ? ? ? ?

## 2022-02-02 NOTE — Telephone Encounter (Signed)
Had received a call from Pharmacist Brayton Mars from [facility] regarding patient complaints of headaches, possibly related to IVIG infusions.  Per Tarri Abernethy- PAC, she does not feel they are from infusion, as this has been addressed in the past and referral was made to neuology.  Advised, per Eugene Garnet that they could try slowing rate down and giving 500 ml of NS over 1 hour prior to infusion, to help with symptoms, should they be as a result of the infusions.  Verbal order given to Brayton Mars.  Will receive a paper document to sign off on. ?

## 2022-02-04 ENCOUNTER — Encounter: Payer: Self-pay | Admitting: Hematology and Oncology

## 2022-02-08 ENCOUNTER — Ambulatory Visit (INDEPENDENT_AMBULATORY_CARE_PROVIDER_SITE_OTHER): Payer: HMO | Admitting: Psychiatry

## 2022-02-08 ENCOUNTER — Other Ambulatory Visit: Payer: Self-pay

## 2022-02-08 DIAGNOSIS — F319 Bipolar disorder, unspecified: Secondary | ICD-10-CM | POA: Diagnosis not present
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
## 2022-02-10 DIAGNOSIS — D801 Nonfamilial hypogammaglobulinemia: Secondary | ICD-10-CM | POA: Diagnosis not present

## 2022-02-14 ENCOUNTER — Encounter: Payer: Self-pay | Admitting: Nurse Practitioner

## 2022-02-14 ENCOUNTER — Other Ambulatory Visit: Payer: Self-pay

## 2022-02-14 ENCOUNTER — Encounter (HOSPITAL_COMMUNITY): Payer: Self-pay | Admitting: Psychiatry

## 2022-02-14 ENCOUNTER — Telehealth (INDEPENDENT_AMBULATORY_CARE_PROVIDER_SITE_OTHER): Payer: HMO | Admitting: Psychiatry

## 2022-02-14 DIAGNOSIS — F319 Bipolar disorder, unspecified: Secondary | ICD-10-CM | POA: Diagnosis not present

## 2022-02-14 DIAGNOSIS — F332 Major depressive disorder, recurrent severe without psychotic features: Secondary | ICD-10-CM

## 2022-02-14 MED ORDER — AMITRIPTYLINE HCL 25 MG PO TABS: ORAL_TABLET | ORAL | 2 refills | Status: DC

## 2022-02-14 NOTE — Progress Notes (Signed)
Virtual Visit via Video Note  I connected with Michelle Clements on 02/14/22 at  3:00 PM EDT by a video enabled telemedicine application and verified that I am speaking with the correct person using two identifiers.  Location: Patient: home Provider: office   I discussed the limitations of evaluation and management by telemedicine and the availability of in person appointments. The patient expressed understanding and agreed to proceed.     I discussed the assessment and treatment plan with the patient. The patient was provided an opportunity to ask questions and all were answered. The patient agreed with the plan and demonstrated an understanding of the instructions.   The patient was advised to call back or seek an in-person evaluation if the symptoms worsen or if the condition fails to improve as anticipated.  I provided 15 minutes of non-face-to-face time during this encounter.   [name], MD  [facility] MD/PA/NP OP Progress Note  02/14/2022 3:14 PM Michelle Clements  MRN:  [id number]  Chief Complaint:  Chief Complaint  Patient presents with   Depression   Anxiety   Follow-up   HPI: This patient is a 56year-old married white female who lives with her husband in [place]. She has two daughters  who live out of the home. 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  She states she wanted to be seen because she is having a lot of nightmares.  She is having a hard time being remembering all of them but at times during the dream she feels like she is trapped.  This seems to correlate with starting Lunesta for sleep.  She denies worsening depression thoughts of self-harm or suicidal ideation.  She states that in general her energy is good and she has been out doing things with friends.  She is still worried about her daughter who has some form of cancer.  I suggested that we stop the [facility] for now.  She would like to retry  amitriptyline that worked for her in the past and I think this is reasonable if we start at a low dose. Visit Diagnosis:    ICD-10-CM   1. Bipolar 1 disorder (HCC)  F31.9     2. Major depressive disorder, recurrent, severe without psychotic features ([place])  F33.2       Past Psychiatric History: Admission earlier this year for overdose/suicide attempt followed by partial hospital program.  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[place])    Depression    Essential hypertension, benign    Fibromyalgia    GERD (gastroesophageal reflux disease)    H/O sleep apnea    Hip pain, left    History of palpitations    Negative Holter monitor   History of pneumonia 1988   Hyperlipidemia    IBS (irritable bowel syndrome)    Neck pain    Poor short term memory    PTSD (post-traumatic stress disorder)    S/P Botox injection 11/2014    For migraine headaches   Type 2 diabetes mellitus ([place])    Urgency of urination  Past Surgical History:  Procedure Laterality Date   CARPAL TUNNEL RELEASE Right 06/10/2013   Procedure: CARPAL TUNNEL RELEASE;  Surgeon: Faythe Ghee, MD;  Location: [facility];  Service: Neurosurgery;  Laterality: Right;  Right Carpal Tunnel Release    CHOLECYSTECTOMY     DENTAL SURGERY  12/2014   mole exc     x 3   MUSCLE BIOPSY  2008   Right leg   TUBAL LIGATION      Family Psychiatric History: see below  Family History:  Family History  Problem Relation Age of Onset   Fibromyalgia Mother    Diabetes type II Mother    Depression Mother    Alzheimer's disease Mother    GER disease Mother    Heart disease Mother    Paranoid behavior Mother    Dementia Mother    Heart attack Father 81        MI x5   Stroke Father        CVA x7   Asthma Father    Brain cancer Father    Seizures Father    Anxiety disorder Sister    OCD Sister    Sexual abuse Sister    Physical abuse Sister    Anxiety disorder Sister    ADD / ADHD Daughter    ADD / ADHD Daughter    Alcohol abuse Neg Hx    Drug abuse Neg Hx    Schizophrenia Neg Hx     Social History:  Social History   Socioeconomic History   Marital status: Married    Spouse name: Not on file   Number of children: 2   Years of education: College   Highest education level: Associate degree: academic program  Occupational History   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   Caffeine use: 2 cups coffee in morning and 2 cups tea during the day    Social Determinants of Health   Financial Resource Strain: Low Risk    Difficulty of Paying Living Expenses: Not very hard  Food Insecurity: No Food Insecurity   Worried About Charity fundraiser in the Last Year: Never true   Ran Out of Food in the Last Year: Never true  Transportation Needs: No Transportation Needs   Lack of Transportation (Medical): No   Lack of Transportation (Non-Medical): No  Physical Activity: Inactive   Days of Exercise per Week: 0 days   Minutes of Exercise per Session: 0 min  Stress: No Stress Concern Present   Feeling of Stress : Not at all  Social Connections: Moderately Integrated   Frequency of Communication with Friends  and Family: More than three times a week   Frequency of Social Gatherings with Friends and Family: More than three times a week   Attends Religious Services: More than 4 times  per year   Active Member of Clubs or Organizations: No   Attends Archivist Meetings: Never   Marital Status: Married    Allergies:  Allergies  Allergen Reactions   Molds & Smuts Shortness Of Breath    wheezing   Other Anaphylaxis, Shortness Of Breath and Other (See Comments)     : Angioedema wheezing Wheezing wheezing Other reaction(s): Cough Wheezing Itching and rash Wheezing wheezing Wheezing wheezing Other reaction(s): Other (See Comments)  : Angioedema wheezing Wheezing wheezing Other reaction(s): Cough Wheezing Itching and rash Wheezing wheezing Other reaction(s): Other (See Comments) Wheezing wheezing  : Angioedema wheezing Wheezing wheezing Other reaction(s): Cough Wheezing Itching and rash Wheezing wheezing Wheezing wheezing   Sulfa Antibiotics Anaphylaxis     : Angioedema    Sulfonamide Derivatives Anaphylaxis     : Angioedema   Trichophyton Shortness Of Breath    wheezing wheezing wheezing wheezing wheezing wheezing wheezing wheezing wheezing   Carbamazepine Rash   Dust Mite Extract Cough and Other (See Comments)    Wheezing Other reaction(s): Wheezing Wheezing Wheezing Other reaction(s): Wheezing Wheezing Wheezing Other reaction(s): Wheezing Wheezing   Gluten Meal Itching   Clonazepam Other (See Comments)    Confusion and memory loss Caused nconfusion    Metabolic Disorder Labs: Lab Results  Component Value Date   HGBA1C 6.1 01/04/2022   MPG 131 03/18/2015   No results found for: PROLACTIN Lab Results  Component Value Date   CHOL 196 08/06/2021   TRIG 144 08/06/2021   HDL 54 08/06/2021   CHOLHDL 4.2 03/18/2015   VLDL 44 (H) 03/18/2015   LDLCALC 117 08/06/2021   LDLCALC 64 12/29/2020   Lab Results  Component Value Date   TSH  2.69 12/29/2020   TSH 1.66 09/14/2020    Therapeutic Level Labs: Lab Results  Component Value Date   LITHIUM 0.20 (L) 02/21/2012   No results found for: VALPROATE No components found for:  CBMZ  Current Medications: Current Outpatient Medications  Medication Sig Dispense Refill   amitriptyline (ELAVIL) 25 MG tablet Take one or two at bedtime 60 tablet 2   albuterol (VENTOLIN HFA) 108 (90 Base) MCG/ACT inhaler albuterol sulfate HFA 90 mcg/actuation aerosol inhaler  INHALE TWO PUFFS BY MOUTH INTO LUNGS EVERY 6 HOURS AS NEEDED     Ascorbic Acid (VITAMIN C) 1000 MG tablet Take 1,000 mg by mouth daily.     Blood Glucose Monitoring Suppl ([place]) w/Device KIT See admin instructions.     cetirizine (ZYRTEC) 10 MG tablet Take 10 mg by mouth at bedtime.     Cholecalciferol (VITAMIN D) 50 MCG (2000 UT) tablet Take 2,000 Units by mouth daily.     clonazePAM (KLONOPIN) 0.5 MG tablet Take 1 tablet (0.5 mg total) by mouth 2 (two) times daily. 60 tablet 2   cycloSPORINE (RESTASIS) 0.05 % ophthalmic emulsion Apply to eye.     estradiol (ESTRACE) 0.1 MG/GM vaginal cream Place vaginally.     FLUoxetine (PROZAC) 40 MG capsule Take 1 capsule (40 mg total) by mouth daily. 30 capsule 2   fluticasone (FLONASE) 50 MCG/ACT nasal spray Place 1 spray into both nostrils daily.     GAMUNEX-C 20 GM/200ML SOLN      Glucos-Chond-Hyal Ac-Ca Fructo (MOVE FREE JOINT HEALTH ADVANCE PO) Take by mouth.     hydrOXYzine (ATARAX) 25 MG tablet hydroxyzine HCl 25 mg tablet  Take 1 tablet 3 times a day by oral route as needed for 7 days.  ibuprofen (ADVIL) 800 MG tablet SMARTSIG:1 Tablet(s) By Mouth Morning-Night     insulin glargine (LANTUS SOLOSTAR) 100 UNIT/ML Solostar Pen Inject 50 Units into the skin daily.     Lancets (ONETOUCH ULTRASOFT) lancets 1 each by Other route as needed.      lidocaine (LIDODERM) 5 % lidocaine 5 % topical patch  APPLY 1 PATCH BY TOPICAL ROUTE ONCE DAILY (MAY WEAR UP TO  12HOURS.)     lisinopril (ZESTRIL) 5 MG tablet lisinopril 5 mg tablet  Take 1 tablet every day by oral route.     Melatonin 5 MG CAPS      Multiple Vitamin (MULTIVITAMIN) tablet Take 1 tablet by mouth daily.     omeprazole (PRILOSEC) 20 MG capsule Take 20 mg by mouth daily.     ONETOUCH VERIO test strip 4 (four) times daily.     pregabalin (LYRICA) 200 MG capsule Take 200 mg by mouth 3 (three) times daily.     rosuvastatin (CRESTOR) 20 MG tablet Take 1 tablet (20 mg total) by mouth at bedtime. 90 tablet 3   sennosides-docusate sodium (SENOKOT-S) 8.6-50 MG tablet Take 1 tablet by mouth daily.     tiZANidine (ZANAFLEX) 4 MG tablet Take 4 mg by mouth daily. 64m at bedtime and 2 mg during the day     traMADol (ULTRAM) 50 MG tablet Take 100 mg by mouth every 8 (eight) hours as needed.     zinc gluconate 50 MG tablet Take 50 mg by mouth daily.     No current facility-administered medications for this visit.     Musculoskeletal: Strength & Muscle Tone: within normal limits Gait & Station: normal Patient leans: N/A  Psychiatric Specialty Exam: Review of Systems  Musculoskeletal:  Positive for myalgias.  Psychiatric/Behavioral:  Positive for sleep disturbance.   All other systems reviewed and are negative.  Last menstrual period 01/04/2018.There is no height or weight on file to calculate BMI.  General Appearance: Casual and Fairly Groomed  Eye Contact:  Good  Speech:  Clear and Coherent  Volume:  Normal  Mood:  Euthymic  Affect:  Appropriate and Congruent  Thought Process:  Goal Directed  Orientation:  Full (Time, Place, and Person)  Thought Content: Rumination   Suicidal Thoughts:  No  Homicidal Thoughts:  No  Memory:  Immediate;   Good Recent;   Good Remote;   Good  Judgement:  Good  Insight:  Fair  Psychomotor Activity:  Normal  Concentration:  Concentration: Good and Attention Span: Good  Recall:  Good  Fund of Knowledge: Good  Language: Good  Akathisia:  No  Handed:   Right  AIMS (if indicated): not done  Assets:  Communication Skills Desire for Improvement Physical Health Resilience Social Support Talents/Skills  ADL's:  Intact  Cognition: WNL  Sleep:  Fair   Screenings: Mini-Mental    [place]  Total Score (max 30 points ) 27      PHQ2-9    Flowsheet Row Video [place]20[place]2021 [place]2021 [place] [facility] [facility]  PHQ-2 Total Score 0 2 0 0 0  PHQ-9 Total Score 4 10 -- -- --      Flowsheet Row Video Visit from 01/19/2022 [facility] Video Visit from 11/22/2021 in [place] [facility] Video Visit from 10/26/2021 in [place] Error: Q3, 4, or 5 should not be populated when Q2 is No Error: Q3, 4, or 5 should not be populated when Q2 is No Error: Q3, 4, or 5 should not be populated when Q2 is No        Assessment and Plan: This patient is a 56 year old female with a history of PTSD depression anxiety hallucinations and nightmares.  Since Johnnye Sima is the newest medication for sleep I suggest we stop it as it might be contributing to the nightmares.  We will restart amitriptyline 25 to 50 mg at bedtime for sleep.  She will continue clonazepam twice daily for anxiety and Prozac 40 mg daily for depression.  She will return to see me in 4 weeks  Collaboration of Care: Collaboration of Care: Referral or follow-up with counselor/therapist AEB patient continues follow-up with Maurice Small  therapist in our office  Patient/Guardian was advised Release of Information must be obtained prior to any record release in order to collaborate their care with an outside provider. Patient/Guardian was advised if they have not already done so to contact the registration department to sign all necessary forms in order for [place] to release information regarding their care.   Consent: Patient/Guardian gives verbal consent for treatment and assignment of benefits for services provided during this visit. Patient/Guardian expressed understanding and agreed to proceed.    [name], MD 02/14/2022, 3:14 PM

## 2022-02-15 ENCOUNTER — Ambulatory Visit (HOSPITAL_COMMUNITY): Payer: HMO | Admitting: Psychiatry

## 2022-02-15 DIAGNOSIS — F319 Bipolar disorder, unspecified: Secondary | ICD-10-CM | POA: Diagnosis not present

## 2022-02-21 ENCOUNTER — Encounter: Payer: Self-pay | Admitting: Nurse Practitioner

## 2022-02-22 ENCOUNTER — Ambulatory Visit (HOSPITAL_COMMUNITY): Payer: HMO | Admitting: Psychiatry

## 2022-03-01 DIAGNOSIS — E119 Type 2 diabetes mellitus without complications: Secondary | ICD-10-CM | POA: Diagnosis not present

## 2022-03-01 LAB — HM DIABETES EYE EXAM

## 2022-03-04 DIAGNOSIS — E1165 Type 2 diabetes mellitus with hyperglycemia: Secondary | ICD-10-CM | POA: Diagnosis not present

## 2022-03-04 DIAGNOSIS — K219 Gastro-esophageal reflux disease without esophagitis: Secondary | ICD-10-CM | POA: Diagnosis not present

## 2022-03-07 ENCOUNTER — Other Ambulatory Visit: Payer: Self-pay | Admitting: Nurse Practitioner

## 2022-03-10 DIAGNOSIS — D801 Nonfamilial hypogammaglobulinemia: Secondary | ICD-10-CM | POA: Diagnosis not present

## 2022-03-15 DIAGNOSIS — J384 Edema of larynx: Secondary | ICD-10-CM | POA: Diagnosis not present

## 2022-03-15 DIAGNOSIS — L539 Erythematous condition, unspecified: Secondary | ICD-10-CM | POA: Diagnosis not present

## 2022-03-15 DIAGNOSIS — R0683 Snoring: Secondary | ICD-10-CM | POA: Diagnosis not present

## 2022-03-15 DIAGNOSIS — Z9989 Dependence on other enabling machines and devices: Secondary | ICD-10-CM | POA: Diagnosis not present

## 2022-03-15 DIAGNOSIS — J387 Other diseases of larynx: Secondary | ICD-10-CM | POA: Diagnosis not present

## 2022-03-15 DIAGNOSIS — R490 Dysphonia: Secondary | ICD-10-CM | POA: Diagnosis not present

## 2022-03-15 DIAGNOSIS — G4733 Obstructive sleep apnea (adult) (pediatric): Secondary | ICD-10-CM | POA: Diagnosis not present

## 2022-03-15 DIAGNOSIS — Z87891 Personal history of nicotine dependence: Secondary | ICD-10-CM | POA: Diagnosis not present

## 2022-03-15 DIAGNOSIS — J383 Other diseases of vocal cords: Secondary | ICD-10-CM | POA: Diagnosis not present

## 2022-03-16 ENCOUNTER — Encounter: Payer: Self-pay | Admitting: Nurse Practitioner

## 2022-03-16 ENCOUNTER — Encounter (HOSPITAL_COMMUNITY): Payer: Self-pay | Admitting: Psychiatry

## 2022-03-16 ENCOUNTER — Telehealth (INDEPENDENT_AMBULATORY_CARE_PROVIDER_SITE_OTHER): Payer: HMO | Admitting: Psychiatry

## 2022-03-16 DIAGNOSIS — F319 Bipolar disorder, unspecified: Secondary | ICD-10-CM

## 2022-03-16 MED ORDER — FLUOXETINE HCL 40 MG PO CAPS: 40.0000 mg | ORAL_CAPSULE | Freq: Every day | ORAL | 2 refills | Status: DC

## 2022-03-16 MED ORDER — TIRZEPATIDE 2.5 MG/0.5ML ~~LOC~~ SOAJ: 2.5000 mg | SUBCUTANEOUS | 0 refills | Status: DC

## 2022-03-16 MED ORDER — AMITRIPTYLINE HCL 25 MG PO TABS: ORAL_TABLET | ORAL | 2 refills | Status: DC

## 2022-03-16 MED ORDER — TIRZEPATIDE 5 MG/0.5ML ~~LOC~~ SOAJ: 5.0000 mg | SUBCUTANEOUS | 3 refills | Status: DC

## 2022-03-16 MED ORDER — CLONAZEPAM 0.5 MG PO TABS: 0.5000 mg | ORAL_TABLET | Freq: Two times a day (BID) | ORAL | 2 refills | Status: DC

## 2022-03-16 NOTE — Progress Notes (Signed)
Virtual Visit via Video Note ? ?I connected with Michelle Clements on 03/16/22 at 11:00 AM EDT by a video enabled telemedicine application and verified that I am speaking with the correct person using two identifiers. ? ?Location: ?Patient: home ?Provider: office ?  ?I discussed the limitations of evaluation and management by telemedicine and the availability of in person appointments. The patient expressed understanding and agreed to proceed. ? ? ?  ?I discussed the assessment and treatment plan with the patient. The patient was provided an opportunity to ask questions and all were answered. The patient agreed with the plan and demonstrated an understanding of the instructions. ?  ?The patient was advised to call back or seek an in-person evaluation if the symptoms worsen or if the condition fails to improve as anticipated. ? ?I provided  minutes of non-face-to-face time during this encounter. ? ? ?[name], MD ? ?BH MD/PA/NP OP Progress Note ? ?03/16/2022 11:14 AM ?Michelle Clements  ?MRN:  [id number] ? ?Chief Complaint:  ?Chief Complaint  ?Patient presents with  ? Anxiety  ? Depression  ? Follow-up  ? ?HPI: This patient is a 56year-old married white female who lives with her husband in [place]. She has two daughters  who live out of the home. 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 ?The patient returns for follow-up after 4 weeks.  Last time she was having a lot of nightmares and we discontinued the Lunesta and switched to amitriptyline.  She is now doing much better.  She denies depression anhedonia thoughts of self-harm or suicidal ideation.  She is getting out and doing things with friends and family.  She is trying to "be more independent."  She denies any auditory or visual hallucinations. ?Visit Diagnosis:  ?  ICD-10-CM   ?1. Bipolar 1 disorder ([place])  F31.9   ?  ? ? ?Past Psychiatric History: Admission earlier this year for overdose/suicide attempt followed by partial hospital program.  She was admitted  for detox several years ago ? ?Past Medical History:  ?Past Medical History:  ?Diagnosis Date  ? Allergic rhinitis   ? Anemia   ? Arthritis   ? Lower back and hips  ? Asthma   ? Bipolar disorder ([place])   ? 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 Constipation   ? 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 Depression   ? Essential hypertension, benign   ? Fibromyalgia   ? GERD (gastroesophageal reflux disease)   ? H/O sleep apnea   ? Hip pain, left   ? History of palpitations   ? Negative Holter monitor  ? History of pneumonia 1988  ? Hyperlipidemia   ? IBS (irritable bowel syndrome)   ? Neck pain   ? Poor short term memory   ? PTSD (post-traumatic stress disorder)   ? S/P Botox injection 11/2014   ? For migraine headaches  ? Type 2 diabetes mellitus ([place])   ? Urgency of urination   ?  ?Past Surgical History:  ?Procedure Laterality Date  ? CARPAL TUNNEL RELEASE Right 06/10/2013  ? Procedure: CARPAL TUNNEL RELEASE;  Surgeon: Faythe Ghee, MD;  Location: [facility];  Service: Neurosurgery;  Laterality: Right;  Right Carpal Tunnel Release ?  ? CHOLECYSTECTOMY    ? DENTAL SURGERY  12/2014  ? mole exc    ? x 3  ? MUSCLE BIOPSY  2008  ? Right leg  ? TUBAL LIGATION    ? ? ?  Family Psychiatric History: See below ? ?Family History:  ?Family History  ?Problem Relation Age of Onset  ? Fibromyalgia Mother   ? Diabetes type II Mother   ? Depression Mother   ? Alzheimer's disease Mother   ? GER disease Mother   ? Heart disease Mother   ? Paranoid behavior Mother   ? Dementia Mother   ? Heart attack Father 35  ?     MI x5  ? Stroke Father   ?     CVA x7  ? Asthma Father   ? Brain cancer Father   ? Seizures Father   ? Anxiety disorder Sister   ? OCD Sister   ? Sexual abuse Sister   ? Physical abuse Sister   ? Anxiety disorder Sister   ? ADD / ADHD Daughter   ? ADD / ADHD Daughter   ? Alcohol abuse Neg Hx   ? Drug abuse Neg Hx   ? Schizophrenia Neg Hx   ? ? ?Social History:  ?Social History  ? ?Socioeconomic History  ? Marital  status: Married  ?  Spouse name: Not on file  ? Number of children: 2  ? Years of education: College  ? Highest education level: Associate degree: academic program  ?Occupational History  ? Occupation: Umemployed  ?  Employer: UNEMPLOYED  ?  Comment: Now disabled due to bipolar and fibromyalgia  ?Tobacco Use  ? Smoking status: Former  ?  Types: Cigarettes  ?  Quit date: 11/04/2010  ?  Years since quitting: 11.3  ? Smokeless tobacco: Never  ?Vaping Use  ? Vaping Use: Never used  ?Substance and Sexual Activity  ? Alcohol use: Yes  ?  Comment: one mixed drink per month  ? Drug use: No  ? Sexual activity: Yes  ?  Birth control/protection: Surgical  ?Other Topics Concern  ? Not on file  ?Social History Narrative  ? Married  ? Lives with spouse and daughter  ? Right handed.  ? Caffeine use: 2 cups coffee in morning and 2 cups tea during the day   ? ?Social Determinants of Health  ? ?Financial Resource Strain: Low Risk   ? Difficulty of Paying Living Expenses: Not very hard  ?Food Insecurity: No Food Insecurity  ? Worried About Charity fundraiser in the Last Year: Never true  ? Ran Out of Food in the Last Year: Never true  ?Transportation Needs: No Transportation Needs  ? Lack of Transportation (Medical): No  ? Lack of Transportation (Non-Medical): No  ?Physical Activity: Inactive  ? Days of Exercise per Week: 0 days  ? Minutes of Exercise per Session: 0 min  ?Stress: No Stress Concern Present  ? Feeling of Stress : Not at all  ?Social Connections: Moderately Integrated  ? Frequency of Communication with Friends and Family: More than three times a week  ? Frequency of Social Gatherings with Friends and Family: More than three times a week  ? Attends Religious Services: More than 4 times per year  ? Active Member of Clubs or Organizations: No  ? Attends Archivist Meetings: Never  ? Marital Status: Married  ? ? ?Allergies:  ?Allergies  ?Allergen Reactions  ? Molds & Smuts Shortness Of Breath  ?  wheezing  ? Other  Anaphylaxis, Shortness Of Breath and Other (See Comments)  ?   : Angioedema ?wheezing ?Wheezing ?wheezing ?Other reaction(s): Cough ?Wheezing ?Itching and rash ?Wheezing ?wheezing ?Wheezing ?wheezing ?Other reaction(s): Other (See Comments) ? :  Angioedema ?wheezing ?Wheezing ?wheezing ?Other reaction(s): Cough ?Wheezing ?Itching and rash ?Wheezing ?wheezing ?Other reaction(s): Other (See Comments) ?Wheezing ?wheezing ? : Angioedema ?wheezing ?Wheezing ?wheezing ?Other reaction(s): Cough ?Wheezing ?Itching and rash ?Wheezing ?wheezing ?Wheezing ?wheezing  ? Sulfa Antibiotics Anaphylaxis  ?   : Angioedema ?  ? Sulfonamide Derivatives Anaphylaxis  ?   : Angioedema  ? Trichophyton Shortness Of Breath  ?  wheezing ?wheezing ?wheezing ?wheezing ?wheezing ?wheezing ?wheezing ?wheezing ?wheezing  ? Carbamazepine Rash  ? Dust Mite Extract Cough and Other (See Comments)  ?  Wheezing ?Other reaction(s): Wheezing ?Wheezing ?Wheezing ?Other reaction(s): Wheezing ?Wheezing ?Wheezing ?Other reaction(s): Wheezing ?Wheezing  ? Gluten Meal Itching  ? Clonazepam Other (See Comments)  ?  Confusion and memory loss ?Caused nconfusion  ? ? ?Metabolic Disorder Labs: ?Lab Results  ?Component Value Date  ? HGBA1C 6.1 01/04/2022  ? MPG 131 03/18/2015  ? ?No results found for: PROLACTIN ?Lab Results  ?Component Value Date  ? CHOL 196 08/06/2021  ? TRIG 144 08/06/2021  ? HDL 54 08/06/2021  ? CHOLHDL 4.2 03/18/2015  ? VLDL 44 (H) 03/18/2015  ? [place] 117 08/06/2021  ? [place] 64 12/29/2020  ? ?Lab Results  ?Component Value Date  ? TSH 2.69 12/29/2020  ? TSH 1.66 09/14/2020  ? ? ?Therapeutic Level Labs: ?Lab Results  ?Component Value Date  ? LITHIUM 0.20 (L) 02/21/2012  ? ?No results found for: VALPROATE ?No components found for:  CBMZ ? ?Current Medications: ?Current Outpatient Medications  ?Medication Sig Dispense Refill  ? albuterol (VENTOLIN HFA) 108 (90 Base) MCG/ACT inhaler albuterol sulfate HFA 90 mcg/actuation aerosol inhaler ? INHALE  TWO PUFFS BY MOUTH INTO LUNGS EVERY 6 HOURS AS NEEDED    ? amitriptyline (ELAVIL) 25 MG tablet Take one or two at bedtime 60 tablet 2  ? Ascorbic Acid (VITAMIN C) 1000 MG tablet Take 1,000 mg by mouth d

## 2022-03-23 DIAGNOSIS — G4733 Obstructive sleep apnea (adult) (pediatric): Secondary | ICD-10-CM | POA: Diagnosis not present

## 2022-03-23 DIAGNOSIS — M542 Cervicalgia: Secondary | ICD-10-CM | POA: Diagnosis not present

## 2022-03-23 DIAGNOSIS — M545 Low back pain, unspecified: Secondary | ICD-10-CM | POA: Diagnosis not present

## 2022-03-23 DIAGNOSIS — Z79891 Long term (current) use of opiate analgesic: Secondary | ICD-10-CM | POA: Diagnosis not present

## 2022-03-31 DIAGNOSIS — E559 Vitamin D deficiency, unspecified: Secondary | ICD-10-CM | POA: Diagnosis not present

## 2022-03-31 DIAGNOSIS — E1169 Type 2 diabetes mellitus with other specified complication: Secondary | ICD-10-CM | POA: Diagnosis not present

## 2022-03-31 DIAGNOSIS — E782 Mixed hyperlipidemia: Secondary | ICD-10-CM | POA: Diagnosis not present

## 2022-03-31 LAB — HEPATIC FUNCTION PANEL
ALT: 39 U/L — AB (ref 7–35)
AST: 39 — AB (ref 13–35)

## 2022-03-31 LAB — COMPREHENSIVE METABOLIC PANEL
Calcium: 10.4 (ref 8.7–10.7)
eGFR: 48

## 2022-03-31 LAB — HEMOGLOBIN A1C: Hemoglobin A1C: 7.4

## 2022-03-31 LAB — BASIC METABOLIC PANEL
BUN: 16 (ref 4–21)
Creatinine: 1.3 — AB (ref 0.5–1.1)

## 2022-03-31 LAB — VITAMIN D 25 HYDROXY (VIT D DEFICIENCY, FRACTURES): Vit D, 25-Hydroxy: 76.6

## 2022-03-31 LAB — LIPID PANEL
LDL Cholesterol: 126
Triglycerides: 199 — AB (ref 40–160)

## 2022-03-31 LAB — MICROALBUMIN, URINE: Microalb, Ur: 26

## 2022-04-03 DIAGNOSIS — N1831 Chronic kidney disease, stage 3a: Secondary | ICD-10-CM | POA: Diagnosis not present

## 2022-04-03 DIAGNOSIS — E1165 Type 2 diabetes mellitus with hyperglycemia: Secondary | ICD-10-CM | POA: Diagnosis not present

## 2022-04-03 DIAGNOSIS — E782 Mixed hyperlipidemia: Secondary | ICD-10-CM | POA: Diagnosis not present

## 2022-04-03 DIAGNOSIS — I129 Hypertensive chronic kidney disease with stage 1 through stage 4 chronic kidney disease, or unspecified chronic kidney disease: Secondary | ICD-10-CM | POA: Diagnosis not present

## 2022-04-04 ENCOUNTER — Other Ambulatory Visit: Payer: Self-pay | Admitting: Nurse Practitioner

## 2022-04-06 DIAGNOSIS — R490 Dysphonia: Secondary | ICD-10-CM | POA: Insufficient documentation

## 2022-04-07 DIAGNOSIS — D801 Nonfamilial hypogammaglobulinemia: Secondary | ICD-10-CM | POA: Diagnosis not present

## 2022-04-08 DIAGNOSIS — E782 Mixed hyperlipidemia: Secondary | ICD-10-CM | POA: Diagnosis not present

## 2022-04-08 DIAGNOSIS — D849 Immunodeficiency, unspecified: Secondary | ICD-10-CM | POA: Diagnosis not present

## 2022-04-08 DIAGNOSIS — N3010 Interstitial cystitis (chronic) without hematuria: Secondary | ICD-10-CM | POA: Diagnosis not present

## 2022-04-08 DIAGNOSIS — E1169 Type 2 diabetes mellitus with other specified complication: Secondary | ICD-10-CM | POA: Diagnosis not present

## 2022-04-08 DIAGNOSIS — E559 Vitamin D deficiency, unspecified: Secondary | ICD-10-CM | POA: Diagnosis not present

## 2022-04-08 DIAGNOSIS — R945 Abnormal results of liver function studies: Secondary | ICD-10-CM | POA: Diagnosis not present

## 2022-04-08 DIAGNOSIS — N1831 Chronic kidney disease, stage 3a: Secondary | ICD-10-CM | POA: Diagnosis not present

## 2022-04-08 DIAGNOSIS — F319 Bipolar disorder, unspecified: Secondary | ICD-10-CM | POA: Diagnosis not present

## 2022-04-08 DIAGNOSIS — G894 Chronic pain syndrome: Secondary | ICD-10-CM | POA: Diagnosis not present

## 2022-04-08 DIAGNOSIS — M797 Fibromyalgia: Secondary | ICD-10-CM | POA: Diagnosis not present

## 2022-04-08 DIAGNOSIS — J4540 Moderate persistent asthma, uncomplicated: Secondary | ICD-10-CM | POA: Diagnosis not present

## 2022-04-20 DIAGNOSIS — Z6833 Body mass index (BMI) 33.0-33.9, adult: Secondary | ICD-10-CM | POA: Diagnosis not present

## 2022-04-20 DIAGNOSIS — M47816 Spondylosis without myelopathy or radiculopathy, lumbar region: Secondary | ICD-10-CM | POA: Diagnosis not present

## 2022-04-20 DIAGNOSIS — M461 Sacroiliitis, not elsewhere classified: Secondary | ICD-10-CM | POA: Diagnosis not present

## 2022-04-21 DIAGNOSIS — F319 Bipolar disorder, unspecified: Secondary | ICD-10-CM | POA: Diagnosis not present

## 2022-04-28 DIAGNOSIS — R490 Dysphonia: Secondary | ICD-10-CM | POA: Diagnosis not present

## 2022-04-28 DIAGNOSIS — J383 Other diseases of vocal cords: Secondary | ICD-10-CM | POA: Diagnosis not present

## 2022-05-04 ENCOUNTER — Ambulatory Visit: Payer: HMO | Admitting: Nurse Practitioner

## 2022-05-04 ENCOUNTER — Encounter: Payer: Self-pay | Admitting: Nurse Practitioner

## 2022-05-04 VITALS — BP 122/78 | HR 89 | Ht 71.0 in | Wt 249.0 lb

## 2022-05-04 DIAGNOSIS — E1165 Type 2 diabetes mellitus with hyperglycemia: Secondary | ICD-10-CM | POA: Diagnosis not present

## 2022-05-04 DIAGNOSIS — Z794 Long term (current) use of insulin: Secondary | ICD-10-CM

## 2022-05-04 DIAGNOSIS — E782 Mixed hyperlipidemia: Secondary | ICD-10-CM | POA: Diagnosis not present

## 2022-05-04 DIAGNOSIS — N1831 Chronic kidney disease, stage 3a: Secondary | ICD-10-CM | POA: Diagnosis not present

## 2022-05-04 DIAGNOSIS — I129 Hypertensive chronic kidney disease with stage 1 through stage 4 chronic kidney disease, or unspecified chronic kidney disease: Secondary | ICD-10-CM | POA: Diagnosis not present

## 2022-05-04 MED ORDER — TIRZEPATIDE 7.5 MG/0.5ML ~~LOC~~ SOAJ: 7.5000 mg | SUBCUTANEOUS | 3 refills | Status: DC

## 2022-05-04 MED ORDER — LANTUS SOLOSTAR 100 UNIT/ML ~~LOC~~ SOPN
45.0000 [IU] | PEN_INJECTOR | Freq: Every day | SUBCUTANEOUS | 3 refills | Status: DC
Start: 2022-05-04 — End: 2023-01-02

## 2022-05-04 NOTE — Progress Notes (Signed)
Endocrinology Follow Up Visit       05/04/2022, 11:02 AM   Subjective:    Patient ID: Michelle Clements, female    DOB: 02/23/1966.  Michelle Clements is being seen in follow up after being seen in consultation for management of currently uncontrolled symptomatic diabetes requested by  [name], MD.   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[place])    Depression    Essential hypertension, benign    Fibromyalgia    GERD (gastroesophageal reflux disease)    H/O sleep apnea    Hip pain, left    History of palpitations    Negative Holter monitor   History of pneumonia 1988   Hyperlipidemia    IBS (irritable bowel syndrome)    Neck pain    Poor short term memory    PTSD (post-traumatic stress disorder)    S/P Botox injection 11/2014    For migraine headaches   Type 2 diabetes mellitus ([place])    Urgency of urination     Past Surgical History:  Procedure Laterality Date   CARPAL TUNNEL RELEASE Right 06/10/2013   Procedure: CARPAL TUNNEL RELEASE;  Surgeon: Faythe Ghee, MD;  Location: [facility];  Service: Neurosurgery;  Laterality: Right;  Right Carpal Tunnel Release    CHOLECYSTECTOMY     DENTAL SURGERY  12/2014   mole exc     x 3   MUSCLE BIOPSY  2008   Right leg   TUBAL LIGATION      Social History   Socioeconomic History   Marital status: Married    Spouse name: Not on file   Number of children: 2   Years of education: College   Highest education level: Associate degree: academic program  Occupational History   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   Caffeine use: 2 cups coffee in morning and 2 cups tea during the day    Social Determinants of Health   Financial Resource Strain: Low Risk    Difficulty of Paying Living Expenses: Not very hard  Food Insecurity: No Food Insecurity   Worried About Charity fundraiser in the Last Year: Never true   Ran Out of Food in the Last Year: Never true  Transportation Needs: No Data processing manager (Medical): No   Lack of Transportation (Non-Medical): No  Physical Activity: Inactive   Days of Exercise per Week: 0 days   Minutes of Exercise per Session: 0 min  Stress: No Stress Concern Present   Feeling of Stress : Not at all  Social Connections: Moderately Integrated   Frequency of Communication with Friends and Family: More than three times a week   Frequency of Social Gatherings with Friends and Family: More than three times a week   Attends Religious Services: More than 4 times per year   Active Member of [employer] or Organizations: No   Attends Music therapist: Never   Marital Status: Married    Family History  Problem Relation Age of Onset   Fibromyalgia Mother    Diabetes type II Mother    Depression Mother    Alzheimer's disease Mother    GER disease Mother    Heart disease Mother    Paranoid behavior Mother    Dementia Mother    Heart attack Father 1       MI x5   Stroke Father        CVA x7   Asthma Father    Brain cancer Father    Seizures Father    Anxiety disorder Sister    OCD Sister    Sexual abuse Sister    Physical abuse Sister     Anxiety disorder Sister    ADD / ADHD Daughter    ADD / ADHD Daughter    Alcohol abuse Neg Hx    Drug abuse Neg Hx    Schizophrenia Neg Hx     Outpatient Encounter Medications as of 05/04/2022  Medication Sig   tirzepatide (MOUNJARO) 7.5 MG/0.5ML Pen Inject 7.5 mg into the skin once a week.   albuterol (VENTOLIN HFA) 108 (90 Base) MCG/ACT inhaler albuterol sulfate HFA 90 mcg/actuation aerosol inhaler  INHALE TWO PUFFS BY MOUTH INTO LUNGS EVERY 6 HOURS AS NEEDED   amitriptyline (ELAVIL) 25 MG tablet Take one or two at bedtime   Ascorbic Acid (VITAMIN C) 1000 MG tablet Take 1,000 mg by mouth daily.   Blood Glucose Monitoring Suppl ([place]) w/Device KIT Use as directed to check blood glucose twice daily, before breakfast and before bed.   cetirizine (ZYRTEC) 10 MG tablet Take 10 mg by mouth at bedtime.   Cholecalciferol (VITAMIN D) 50 MCG (2000 UT) tablet Take 2,000 Units by mouth daily.   clonazePAM (KLONOPIN) 0.5 MG tablet Take 1 tablet (0.5 mg total) by mouth 2 (two) times daily.   cycloSPORINE (RESTASIS) 0.05 % ophthalmic emulsion Apply to eye.   estradiol (ESTRACE) 0.1 MG/GM vaginal cream Place vaginally.   FLUoxetine (PROZAC) 40 MG capsule Take 1 capsule (40 mg total) by mouth daily.   fluticasone (FLONASE) 50 MCG/ACT nasal spray Place 1 spray into both nostrils daily.   GAMUNEX-C 20 GM/200ML SOLN    Glucos-Chond-Hyal Ac-Ca Fructo (MOVE FREE JOINT HEALTH ADVANCE PO) Take by mouth.   hydrOXYzine (ATARAX) 25 MG tablet hydroxyzine HCl 25 mg tablet  Take 1 tablet 3 times a day by oral route as needed for 7 days.   ibuprofen (ADVIL) 800 MG tablet SMARTSIG:1 Tablet(s) By Mouth Morning-Night   insulin glargine (LANTUS SOLOSTAR) 100 UNIT/ML Solostar Pen Inject 45 Units into the skin daily.   Lancets (ONETOUCH ULTRASOFT) lancets 1 each by Other route as needed.    lidocaine (LIDODERM) 5 %  lidocaine 5 % topical patch  APPLY 1 PATCH BY TOPICAL ROUTE ONCE DAILY (MAY WEAR  UP TO 12HOURS.)   lisinopril (ZESTRIL) 5 MG tablet lisinopril 5 mg tablet  Take 1 tablet every day by oral route.   Melatonin 5 MG CAPS    Multiple Vitamin (MULTIVITAMIN) tablet Take 1 tablet by mouth daily.   omeprazole (PRILOSEC) 20 MG capsule Take 20 mg by mouth daily.   ONETOUCH VERIO test strip 4 (four) times daily.   pregabalin (LYRICA) 200 MG capsule Take 200 mg by mouth 3 (three) times daily.   rosuvastatin (CRESTOR) 20 MG tablet TAKE ONE TABLET BY MOUTH EVERYDAY AT BEDTIME   sennosides-docusate sodium (SENOKOT-S) 8.6-50 MG tablet Take 1 tablet by mouth daily.   tiZANidine (ZANAFLEX) 4 MG tablet Take 4 mg by mouth daily. 71m at bedtime and 2 mg during the day   traMADol (ULTRAM) 50 MG tablet Take 100 mg by mouth every 8 (eight) hours as needed.   zinc gluconate 50 MG tablet Take 50 mg by mouth daily.   [DISCONTINUED] insulin glargine (LANTUS SOLOSTAR) 100 UNIT/ML Solostar Pen Inject 45 Units into the skin daily.   [DISCONTINUED] tirzepatide ([facility] 2.5 MG/0.5ML Pen Inject 2.5 mg into the skin once a week.   [DISCONTINUED] tirzepatide ([facility] 5 MG/0.5ML Pen Inject 5 mg into the skin once a week.   No facility-administered encounter medications on file as of 05/04/2022.    ALLERGIES: Allergies  Allergen Reactions   Molds & Smuts Shortness Of Breath    wheezing   Other Anaphylaxis, Shortness Of Breath and Other (See Comments)     : Angioedema wheezing Wheezing wheezing Other reaction(s): Cough Wheezing Itching and rash Wheezing wheezing Wheezing wheezing Other reaction(s): Other (See Comments)  : Angioedema wheezing Wheezing wheezing Other reaction(s): Cough Wheezing Itching and rash Wheezing wheezing Other reaction(s): Other (See Comments) Wheezing wheezing  : Angioedema wheezing Wheezing wheezing Other reaction(s): Cough Wheezing Itching and rash Wheezing wheezing Wheezing wheezing   Sulfa Antibiotics Anaphylaxis     : Angioedema     Sulfonamide Derivatives Anaphylaxis     : Angioedema   Trich[place] Shortness Of Breath    wheezing wheezing wheezing wheezing wheezing wheezing wheezing wheezing wheezing   Carbamazepine Rash   Dust Mite Extract Cough and Other (See Comments)    Wheezing Other reaction(s): Wheezing Wheezing Wheezing Other reaction(s): Wheezing Wheezing Wheezing Other reaction(s): Wheezing Wheezing   Gluten Meal Itching   Clonazepam Other (See Comments)    Confusion and memory loss Caused nconfusion    VACCINATION STATUS: Immunization History  Administered Date(s) Administered   Influenza Whole 10/03/2007   Influenza-Unspecified 09/18/2014, 09/23/2015   Pneumococcal Polysaccharide-23 10/14/2012    Diabetes She presents for her follow-up diabetic visit. She has type 2 diabetes mellitus. Onset time: She was diagnosed at approximate age of 89 Her disease course has been fluctuating. Hypoglycemia symptoms include sweats and tremors. Pertinent negatives for diabetes include no blurred vision, no fatigue, no polydipsia, no polyphagia and no polyuria. Hypoglycemia complications include nocturnal hypoglycemia. Symptoms are stable. Diabetic complications include nephropathy and retinopathy. Risk factors for coronary artery disease include diabetes mellitus, dyslipidemia, obesity and sedentary lifestyle. Current diabetic treatment includes insulin injections (and Mounjaro). She is compliant with treatment all of the time. Her weight is fluctuating minimally. She is following a generally healthy diet. Meal planning includes avoidance of concentrated sweets. She has not had a previous visit with a dietitian. She participates in exercise intermittently. Her home blood glucose trend is decreasing  steadily. Her breakfast blood glucose range is generally 90-110 mg/dl. Her bedtime blood glucose range is generally 110-130 mg/dl. (She presents today with her meter and logs showing at target fasting and  postprandial glycemic profile.  Her most recent A1c on 03/31/22 was 7.4%, increasing from last visit.  She did call between visits for concerns with higher glucose and meds were adjusted accordingly.  She is tolerating the addition of Mounjaro well.  She does have some tightening of fasting glucose, rarely has lows.) An ACE inhibitor/angiotensin II receptor blocker is not being taken. She does not see a podiatrist.Eye exam is current.  Hyperlipidemia This is a chronic problem. The current episode started more than 1 year ago. The problem is uncontrolled. Recent lipid tests were reviewed and are variable. Exacerbating diseases include chronic renal disease, diabetes and obesity. There are no known factors aggravating her hyperlipidemia. Current antihyperlipidemic treatment includes statins. The current treatment provides moderate improvement of lipids. There are no compliance problems.  Risk factors for coronary artery disease include diabetes mellitus, dyslipidemia, obesity and a sedentary lifestyle.   Review of systems  Constitutional: + Minimally fluctuating body weight,  current Body mass index is 34.73 kg/m. , no fatigue, no subjective hyperthermia, no subjective hypothermia Eyes: no blurry vision, no xerophthalmia ENT: no sore throat, no nodules palpated in throat, no dysphagia/odynophagia, no hoarseness Cardiovascular: no chest pain, no shortness of breath, no palpitations, no leg swelling Respiratory: no cough, no shortness of breath Gastrointestinal: no nausea/vomiting/diarrhea Musculoskeletal: no muscle/joint aches Skin: no rashes, no hyperemia Neurological: no tremors, no numbness, no tingling, no dizziness Psychiatric: no depression, no anxiety   Objective:    BP 122/78   Pulse 89   Ht 5' 11"  (1.803 m)   Wt 249 lb (112.9 kg)   LMP 01/04/2018   BMI 34.73 kg/m   Wt Readings from Last 3 Encounters:  05/04/22 249 lb (112.9 kg)  02/02/22 234 lb 8 oz (106.4 kg)  01/04/22 237 lb 3.2  oz (107.6 kg)    BP Readings from Last 3 Encounters:  05/04/22 122/78  02/02/22 129/81  01/04/22 129/83     Physical Exam- Limited  Constitutional:  Body mass index is 34.73 kg/m. , not in acute distress, normal state of mind Eyes:  EOMI, no exophthalmos Neck: Supple Cardiovascular: RRR, no murmurs, rubs, or gallops, no edema Respiratory: Adequate breathing efforts, no crackles, rales, rhonchi, or wheezing Musculoskeletal: no gross deformities, strength intact in all four extremities, no gross restriction of joint movements Skin:  no rashes, no hyperemia Neurological: no tremor with outstretched hands   CMP ( most recent) CMP     Component Value Date/Time   NA 139 12/23/2021 0000   K 5.0 12/23/2021 0000   CL 100 12/23/2021 0000   CO2 21 12/23/2021 0000   GLUCOSE 119 (H) 07/03/2019 1230   BUN 16 03/31/2022 0000   CREATININE 1.3 (A) 03/31/2022 0000   CREATININE 1.00 07/03/2019 1230   CREATININE 0.88 02/21/2012 1153   CALCIUM 10.4 03/31/2022 0000   PROT 7.0 07/03/2019 1230   ALBUMIN 4.4 12/23/2021 0000   AST 39 (A) 03/31/2022 0000   ALT 39 (A) 03/31/2022 0000   ALKPHOS 83 12/23/2021 0000   BILITOT 0.9 07/03/2019 1230   GFRNONAA 44 12/29/2020 0000   GFRAA 50 12/29/2020 0000     Diabetic Labs (most recent): Lab Results  Component Value Date   HGBA1C 7.4 03/31/2022   HGBA1C 6.1 01/04/2022   HGBA1C 5.7 08/25/2021  Lipid Panel ( most recent) Lipid Panel     Component Value Date/Time   CHOL 196 08/06/2021 0000   TRIG 199 (A) 03/31/2022 0000   HDL 54 08/06/2021 0000   CHOLHDL 4.2 03/18/2015 0915   VLDL 44 (H) 03/18/2015 0915   LDLCALC 126 03/31/2022 0000      Lab Results  Component Value Date   TSH 2.69 12/29/2020   TSH 1.66 09/14/2020   TSH 2.770 08/29/2019   TSH 3.261 10/14/2012   TSH 2.651 02/21/2012   TSH 1.981 03/06/2009   TSH 3.250 01/23/2008           Assessment & Plan:   1) Type 2 diabetes mellitus with hyperglycemia, with  long-term current use of insulin (HCC)  - Michelle Clements has currently uncontrolled symptomatic type 2 DM since 56 years of age.  She presents today with her meter and logs showing at target fasting and postprandial glycemic profile.  Her most recent A1c on 03/31/22 was 7.4%, increasing from last visit.  She did call between visits for concerns with higher glucose and meds were adjusted accordingly.  She is tolerating the addition of Mounjaro well.  She does have some tightening of fasting glucose, rarely has lows.   Recent labs reviewed, showing stable CKD stage 3.  - I had a long discussion with her about the progressive nature of diabetes and the pathology behind its complications. -her diabetes is complicated by sleep apnea, HLD, retinopathy and she remains at a high risk for more acute and chronic complications which include CAD, CVA, CKD, retinopathy, and neuropathy. These are all discussed in detail with her.  - Nutritional counseling repeated at each appointment due to patients tendency to fall back in to old habits.  - The patient admits there is a room for improvement in their diet and drink choices. -  Suggestion is made for the patient to avoid simple carbohydrates from their diet including Cakes, Sweet Desserts / Pastries, Ice Cream, Soda (diet and regular), Sweet Tea, Candies, Chips, Cookies, Sweet Pastries, Store Bought Juices, Alcohol in Excess of 1-2 drinks a day, Artificial Sweeteners, Coffee Creamer, and "Sugar-free" Products. This will help patient to have stable blood glucose profile and potentially avoid unintended weight gain.   - I encouraged the patient to switch to unprocessed or minimally processed complex starch and increased protein intake (animal or plant source), fruits, and vegetables.   - Patient is advised to stick to a routine mealtimes to eat 3 meals a day and avoid unnecessary snacks (to snack only to correct hypoglycemia).  - she is following with Jearld Fenton, RDE for diabetes education.  - I have approached her with the following individualized plan to manage  her diabetes and patient agrees:   -She is advised to lower her Lantus to 45 units SQ nightly and increase her Mounjaro to 7.5 mg SQ weekly.     -She is encouraged to continue monitoring blood glucose twice daily, before breakfast and before bed, and to call the clinic if she has readings less than 70 or greater than 200 for 3 tests in a row.  - Specific targets for  A1c;  LDL, HDL,  and Triglycerides were discussed with the patient.  2) Blood Pressure /Hypertension:   her blood pressure is controlled to target without the use of antihypertensive medications.  She is advised to continue Lisinopril 5 mg po daily with breakfast.   3) Lipids/Hyperlipidemia:    Her most recent lipid panel from  03/31/22 shows LDL of 126 and elevated triglycerides of 199.  She is advised to continue her Crestor 20 mg po daily at bedtime.  Side effects and precautions discussed with her.  4) Weight/Diet:  Her Body mass index is 34.73 kg/m.  -  clearly complicating her diabetes care.  She has lost approx 10 lbs and is advised to keep it up!  she is a candidate for weight loss. I discussed with her the fact that loss of 5 - 10% of her  current body weight will have the most impact on her diabetes management.  Exercise, and detailed carbohydrates information provided  -  detailed on discharge instructions.  5) Vitamin D deficiency Her most recent vitamin D level was 76.6 on 03/31/22.  She is currently on OTC vitamin D3 2000 units daily.  She can take a break from supplementation at this time.  6) Chronic Care/Health Maintenance: -she is on Statin medications and is encouraged to initiate and continue to follow up with Ophthalmology, Dentist,  Podiatrist at least yearly or according to recommendations, and advised to stay away from smoking. I have recommended yearly flu vaccine and pneumonia vaccine at least  every 5 years; moderate intensity exercise for up to 150 minutes weekly; and  sleep for at least 7 hours a day.  - she is advised to maintain close follow up with [name], MD for primary care needs, as well as her other providers for optimal and coordinated care.     I spent 38 minutes in the care of the patient today including review of labs from [facility], Lipids, Thyroid Function, Hematology (current and previous including abstractions from other facilities); face-to-face time discussing  her blood glucose readings/logs, discussing hypoglycemia and hyperglycemia episodes and symptoms, medications doses, her options of short and long term treatment based on the latest standards of care / guidelines;  discussion about incorporating lifestyle medicine;  and documenting the encounter.    Please refer to Patient Instructions for Blood Glucose Monitoring and Insulin/Medications Dosing Guide"  in media tab for additional information. Please  also refer to " Patient Self Inventory" in the Media  tab for reviewed elements of pertinent patient history.  Michelle Clements participated in the discussions, expressed understanding, and voiced agreement with the above plans.  All questions were answered to her satisfaction. she is encouraged to contact clinic should she have any questions or concerns prior to her return visit.   Follow up plan: - Return in about 3 months (around 08/04/2022) for Diabetes F/U with A1c in office, No previsit labs, Bring meter and logs.  Rayetta Pigg, [facility] [address] [place], [place] [address] Phone: [phone] Fax: [phone]  05/04/2022, 11:02 AM

## 2022-05-04 NOTE — Patient Instructions (Signed)
Diabetes Mellitus and Foot Care Foot care is an important part of your health, especially when you have diabetes. Diabetes may cause you to have problems because of poor blood flow (circulation) to your feet and legs, which can cause your skin to: Become thinner and drier. Break more easily. Heal more slowly. Peel and crack. You may also have nerve damage (neuropathy) in your legs and feet, causing decreased feeling in them. This means that you may not notice minor injuries to your feet that could lead to more serious problems. Noticing and addressing any potential problems early is the best way to prevent future foot problems. How to care for your feet Foot hygiene  Wash your feet daily with warm water and mild soap. Do not use hot water. Then, pat your feet and the areas between your toes until they are completely dry. Do not soak your feet as this can dry your skin. Trim your toenails straight across. Do not dig under them or around the cuticle. File the edges of your nails with an emery board or nail file. Apply a moisturizing lotion or petroleum jelly to the skin on your feet and to dry, brittle toenails. Use lotion that does not contain alcohol and is unscented. Do not apply lotion between your toes. Shoes and socks Wear clean socks or stockings every day. Make sure they are not too tight. Do not wear knee-high stockings since they may decrease blood flow to your legs. Wear shoes that fit properly and have enough cushioning. Always look in your shoes before you put them on to be sure there are no objects inside. To break in new shoes, wear them for just a few hours a day. This prevents injuries on your feet. Wounds, scrapes, corns, and calluses  Check your feet daily for blisters, cuts, bruises, sores, and redness. If you cannot see the bottom of your feet, use a mirror or ask someone for help. Do not cut corns or calluses or try to remove them with medicine. If you find a minor scrape,  cut, or break in the skin on your feet, keep it and the skin around it clean and dry. You may clean these areas with mild soap and water. Do not clean the area with peroxide, alcohol, or iodine. If you have a wound, scrape, corn, or callus on your foot, look at it several times a day to make sure it is healing and not infected. Check for: Redness, swelling, or pain. Fluid or blood. Warmth. Pus or a bad smell. General tips Do not cross your legs. This may decrease blood flow to your feet. Do not use heating pads or hot water bottles on your feet. They may burn your skin. If you have lost feeling in your feet or legs, you may not know this is happening until it is too late. Protect your feet from hot and cold by wearing shoes, such as at the beach or on hot pavement. Schedule a complete foot exam at least once a year (annually) or more often if you have foot problems. Report any cuts, sores, or bruises to your health care provider immediately. Where to find more information American Diabetes Association: www.diabetes.org Association of Diabetes Care & Education Specialists: www.diabeteseducator.org Contact a health care provider if: You have a medical condition that increases your risk of infection and you have any cuts, sores, or bruises on your feet. You have an injury that is not healing. You have redness on your legs or feet. You   feel burning or tingling in your legs or feet. You have pain or cramps in your legs and feet. Your legs or feet are numb. Your feet always feel cold. You have pain around any toenails. Get help right away if: You have a wound, scrape, corn, or callus on your foot and: You have pain, swelling, or redness that gets worse. You have fluid or blood coming from the wound, scrape, corn, or callus. Your wound, scrape, corn, or callus feels warm to the touch. You have pus or a bad smell coming from the wound, scrape, corn, or callus. You have a fever. You have a red  line going up your leg. Summary Check your feet every day for blisters, cuts, bruises, sores, and redness. Apply a moisturizing lotion or petroleum jelly to the skin on your feet and to dry, brittle toenails. Wear shoes that fit properly and have enough cushioning. If you have foot problems, report any cuts, sores, or bruises to your health care provider immediately. Schedule a complete foot exam at least once a year (annually) or more often if you have foot problems. This information is not intended to replace advice given to you by your health care provider. Make sure you discuss any questions you have with your health care provider. Document Revised: 06/11/2020 Document Reviewed: 06/11/2020 Elsevier Patient Education  2023 Elsevier Inc.  

## 2022-05-05 ENCOUNTER — Inpatient Hospital Stay (HOSPITAL_COMMUNITY): Payer: HMO | Attending: Hematology | Admitting: Physician Assistant

## 2022-05-05 VITALS — BP 121/69 | HR 88 | Temp 98.0°F | Resp 20 | Ht 70.67 in | Wt 248.2 lb

## 2022-05-05 DIAGNOSIS — D801 Nonfamilial hypogammaglobulinemia: Secondary | ICD-10-CM

## 2022-05-05 DIAGNOSIS — Z87891 Personal history of nicotine dependence: Secondary | ICD-10-CM | POA: Insufficient documentation

## 2022-05-05 DIAGNOSIS — D839 Common variable immunodeficiency, unspecified: Secondary | ICD-10-CM

## 2022-05-05 NOTE — Progress Notes (Addendum)
Contacted Stephanie Clifton-Pace, Clinical Pharmasist at [facility] to request CMP be added to labs (CBCD, IGG, IGM, IGA) in 3 months.  Scheduled to be drawn prior to August infusion.

## 2022-05-05 NOTE — Patient Instructions (Signed)
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  - We will continue IVIG infusions once per month via your home health agency.    - Your home health agency should be checking your immunoglobulin levels before EACH of your IVIG infusions.  - At your lab check in 3 months, home health also needs to check your CBC and CMP.  If these levels are not checked by home health, we will have them added to your next appointment.  - Your headaches do not fit the typical pattern of IVIG-induced headaches.  They may be due to stress, sinus problems, or some other condition.  You have already been referred to see neurology.  I suggest that you follow-up with them regarding the cause of your headaches.  - Speak with your primary care provider regarding your other chronic conditions, especially your asthma and chest tightness.  MEDICATIONS: Continue IVIG every 4 weeks  FOLLOW-UP APPOINTMENT: Office visit in 3 months   Thank you for choosing [facility] [facility] to provide your oncology and hematology care.  To afford each patient quality time with our provider, please arrive at least 15 minutes before your scheduled appointment time.   If you have a lab appointment with the [facility] please come in thru the Main Entrance and check in at the main information desk.  You need to re-schedule your appointment should you arrive 10 or more minutes late.  We strive to give you quality time with our providers, and arriving late affects you and other patients whose appointments are after yours.  Also, if you no show three or more times for appointments you may be dismissed from the clinic at the providers discretion.     Again, thank you for choosing [facility].  Our hope is that these requests will decrease the amount of time  that you wait before being seen by our physicians.       _____________________________________________________________  Should you have questions after your visit to [facility], please contact our office at [phone] and follow the prompts.  Our office hours are 8:00 a.m. and 4:30 p.m. Monday - Friday.  Please note that voicemails left after 4:00 p.m. may not be returned until the following business day.  We are closed weekends and major holidays.  You do have access to a nurse 24-7, just call the main number to the clinic [phone] and do not press any options, hold on the line and a nurse will answer the phone.    For prescription refill requests, have your pharmacy contact our office and allow 72 hours.    Due to Covid, you will need to wear a mask upon entering the hospital. If you do not have a mask, a mask will be given to you at the Main Entrance upon arrival. For doctor visits, patients may have 1 support person age 54 or older with them. For treatment visits, patients can not have anyone with them due to social distancing guidelines and our immunocompromised population.

## 2022-05-05 NOTE — Progress Notes (Signed)
[facility] [address], [place] [address]   CLINIC:  Medical Oncology/Hematology  PCP:  [name], MD [place] [address] [phone]   REASON FOR VISIT:  Follow-up for hypogammaglobulinemia   CURRENT THERAPY: IVIG  INTERVAL HISTORY:  [name] 56 y.o. 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  She was last seen by Tarri Abernethy PA-C on 02/02/2022.    At today's visit, she reports feeling fair.     She has not had any infections requiring antibiotics in the last 3 months. She has mild chest tightness and wheezing from asthma, but this is improved from her last visit. She continues to have sinus congestion and drainage, has been followed by ENT.  She reports that she started taking guaifenesin, which is helped with some of her sinus issues. She denies any skin issues or rashes.  She denies any skin issues or rashes.  She denies any dysuria or lower urinary tract symptoms.  She has previously had intermittent diarrhea from her IBS, but this is currently improved.  No changes in her bowel or bladder habits.  No B symptoms such as fever, chills, night sweats, unintentional weight loss.    She reports that her generalized body pain and fibromyalgia pains are slightly improved after being back on IVIG.  She continues to have frequent headaches, which began around September 2022 (prior to her first IVIG infusion in November 2022).  There is no temporal association between her headaches and the timing of her IVIG.  She denies any symptoms of meningeal irritation or altered mental status.  No associated fever, chills, muscle spasms, or body aches.   Otherwise, patient is tolerating her IVIG infusions well.  She make sure that she hydrates to the point that she has clear urine.  She is currently receiving premedication with Tylenol and Benadryl.  She feels "tired" on the day of her infusion, but "bounces right  back" the next day.  She has 50% energy and 100% appetite. She endorses that she is maintaining a stable weight.   REVIEW OF SYSTEMS:    Review of Systems  Constitutional:  Positive for fatigue. Negative for appetite change, chills, diaphoresis, fever and unexpected weight change.  HENT:   Negative for lump/mass and nosebleeds.   Eyes:  Negative for eye problems.  Respiratory:  Positive for chest tightness and wheezing (asthma). Negative for cough, hemoptysis and shortness of breath.   Cardiovascular:  Negative for chest pain, leg swelling and palpitations.  Gastrointestinal:  Negative for abdominal pain, blood in stool, constipation, diarrhea, nausea and vomiting.  Genitourinary:  Negative for hematuria.   Musculoskeletal:  Positive for arthralgias, myalgias and neck pain.  Skin: Negative.   Neurological:  Positive for headaches (daily). Negative for dizziness and light-headedness.  Hematological:  Does not bruise/bleed easily.  Psychiatric/Behavioral:  Positive for sleep disturbance.      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[place])    Depression    Essential hypertension, benign    Fibromyalgia    GERD (gastroesophageal reflux disease)    H/O sleep apnea    Hip pain, left    History of palpitations  Negative Holter monitor   History of pneumonia 1988   Hyperlipidemia    IBS (irritable bowel syndrome)    Neck pain    Poor short term memory    PTSD (post-traumatic stress disorder)    S/P Botox injection 11/2014    For migraine headaches   Type 2 diabetes mellitus ([place])    Urgency of urination    Past Surgical History:  Procedure Laterality Date   CARPAL TUNNEL RELEASE Right 06/10/2013   Procedure: CARPAL TUNNEL RELEASE;  Surgeon: Faythe Ghee, MD;  Location: [facility];  Service: Neurosurgery;  Laterality: Right;  Right Carpal Tunnel Release    CHOLECYSTECTOMY     DENTAL SURGERY  12/2014   mole exc     x 3   MUSCLE BIOPSY  2008   Right leg   TUBAL LIGATION       SOCIAL HISTORY:  Social History   Socioeconomic History   Marital status: Married    Spouse name: Not on file   Number of children: 2   Years of education: College   Highest education level: Associate degree: academic program  Occupational History   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   Caffeine use: 2 cups coffee in morning and 2 cups tea during the day    Social Determinants of Health   Financial Resource Strain: Low Risk    Difficulty of Paying Living Expenses: Not very hard  Food Insecurity: No Food Insecurity   Worried About Charity fundraiser in the Last Year: Never true   Ran Out of Food in the Last Year: Never true  Transportation Needs: No Transportation Needs   Lack of Transportation (Medical): No   Lack of Transportation (Non-Medical): No  Physical Activity: Inactive   Days of Exercise per Week: 0 days   Minutes of Exercise per Session: 0 min  Stress: No Stress Concern Present   Feeling of Stress : Not at all  Social Connections: Moderately Integrated   Frequency of Communication with Friends and Family: More than three times a week   Frequency of Social Gatherings with Friends and Family:  More than three times a week   Attends Religious Services: More than 4 times per year   Active Member of [employer] or Organizations: No   Attends Music therapist: Never   Marital Status: Married  Human resources officer Violence: Not At Risk   Fear of Current or Ex-Partner: No   Emotionally Abused: No   Physically Abused: No   Sexually Abused: No    FAMILY HISTORY:  Family History  Problem Relation Age of Onset   Fibromyalgia Mother    Diabetes type II Mother    Depression Mother    Alzheimer's disease Mother    GER disease Mother    Heart disease Mother  Paranoid behavior Mother    Dementia Mother    Heart attack Father 26       MI x5   Stroke Father        CVA x7   Asthma Father    Brain cancer Father    Seizures Father    Anxiety disorder Sister    OCD Sister    Sexual abuse Sister    Physical abuse Sister    Anxiety disorder Sister    ADD / ADHD Daughter    ADD / ADHD Daughter    Alcohol abuse Neg Hx    Drug abuse Neg Hx    Schizophrenia Neg Hx     CURRENT MEDICATIONS:  Outpatient Encounter Medications as of 05/05/2022  Medication Sig Note   albuterol (VENTOLIN HFA) 108 (90 Base) MCG/ACT inhaler albuterol sulfate HFA 90 mcg/actuation aerosol inhaler  INHALE TWO PUFFS BY MOUTH INTO LUNGS EVERY 6 HOURS AS NEEDED    amitriptyline (ELAVIL) 25 MG tablet Take one or two at bedtime    Ascorbic Acid (VITAMIN C) 1000 MG tablet Take 1,000 mg by mouth daily.    Blood Glucose Monitoring Suppl ([place]) w/Device KIT Use as directed to check blood glucose twice daily, before breakfast and before bed.    cetirizine (ZYRTEC) 10 MG tablet Take 10 mg by mouth at bedtime.    Cholecalciferol (VITAMIN D) 50 MCG (2000 UT) tablet Take 2,000 Units by mouth daily.    clonazePAM (KLONOPIN) 0.5 MG tablet Take 1 tablet (0.5 mg total) by mouth 2 (two) times daily.    cycloSPORINE (RESTASIS) 0.05 % ophthalmic emulsion Apply to eye.    estradiol (ESTRACE) 0.1 MG/GM  vaginal cream Place vaginally.    FLUoxetine (PROZAC) 40 MG capsule Take 1 capsule (40 mg total) by mouth daily.    fluticasone (FLONASE) 50 MCG/ACT nasal spray Place 1 spray into both nostrils daily.    GAMUNEX-C 20 GM/200ML SOLN     Glucos-Chond-Hyal Ac-Ca Fructo (MOVE FREE JOINT HEALTH ADVANCE PO) Take by mouth.    hydrOXYzine (ATARAX) 25 MG tablet hydroxyzine HCl 25 mg tablet  Take 1 tablet 3 times a day by oral route as needed for 7 days.    ibuprofen (ADVIL) 800 MG tablet SMARTSIG:1 Tablet(s) By Mouth Morning-Night    insulin glargine (LANTUS SOLOSTAR) 100 UNIT/ML Solostar Pen Inject 45 Units into the skin daily.    Lancets (ONETOUCH ULTRASOFT) lancets 1 each by Other route as needed.  04/25/2014: Received from: External Pharmacy   lidocaine (LIDODERM) 5 % lidocaine 5 % topical patch  APPLY 1 PATCH BY TOPICAL ROUTE ONCE DAILY (MAY WEAR UP TO 12HOURS.)    lisinopril (ZESTRIL) 5 MG tablet lisinopril 5 mg tablet  Take 1 tablet every day by oral route.    Melatonin 5 MG CAPS     Multiple Vitamin (MULTIVITAMIN) tablet Take 1 tablet by mouth daily.    omeprazole (PRILOSEC) 20 MG capsule Take 20 mg by mouth daily.    ONETOUCH VERIO test strip 4 (four) times daily.    pregabalin (LYRICA) 200 MG capsule Take 200 mg by mouth 3 (three) times daily.    rosuvastatin (CRESTOR) 20 MG tablet TAKE ONE TABLET BY MOUTH EVERYDAY AT BEDTIME    sennosides-docusate sodium (SENOKOT-S) 8.6-50 MG tablet Take 1 tablet by mouth daily.    tirzepatide (MOUNJARO) 7.5 MG/0.5ML Pen Inject 7.5 mg into the skin once a week.    tiZANidine (ZANAFLEX) 4 MG tablet Take 4 mg by mouth  daily. 63m at bedtime and 2 mg during the day 03/12/2015: Received from: External Pharmacy Received Sig:    traMADol (ULTRAM) 50 MG tablet Take 100 mg by mouth every 8 (eight) hours as needed.    zinc gluconate 50 MG tablet Take 50 mg by mouth daily.    No facility-administered encounter medications on file as of 05/05/2022.    ALLERGIES:   Allergies  Allergen Reactions   Molds & Smuts Shortness Of Breath    wheezing   Other Anaphylaxis, Shortness Of Breath and Other (See Comments)     : Angioedema wheezing Wheezing wheezing Other reaction(s): Cough Wheezing Itching and rash Wheezing wheezing Wheezing wheezing Other reaction(s): Other (See Comments)  : Angioedema wheezing Wheezing wheezing Other reaction(s): Cough Wheezing Itching and rash Wheezing wheezing Other reaction(s): Other (See Comments) Wheezing wheezing  : Angioedema wheezing Wheezing wheezing Other reaction(s): Cough Wheezing Itching and rash Wheezing wheezing Wheezing wheezing   Sulfa Antibiotics Anaphylaxis     : Angioedema    Sulfonamide Derivatives Anaphylaxis     : Angioedema   Trichophyton Shortness Of Breath    wheezing wheezing wheezing wheezing wheezing wheezing wheezing wheezing wheezing   Carbamazepine Rash   Dust Mite Extract Cough and Other (See Comments)    Wheezing Other reaction(s): Wheezing Wheezing Wheezing Other reaction(s): Wheezing Wheezing Wheezing Other reaction(s): Wheezing Wheezing   Gluten Meal Itching   Clonazepam Other (See Comments)    Confusion and memory loss Caused nconfusion     PHYSICAL EXAM:  ECOG PERFORMANCE STATUS: 1 - Symptomatic but completely ambulatory    There were no vitals filed for this visit. There were no vitals filed for this visit. Physical Exam Constitutional:      Appearance: Normal appearance. She is obese.  HENT:     Head: Normocephalic and atraumatic.     Mouth/Throat:     Mouth: Mucous membranes are moist.  Eyes:     Extraocular Movements: Extraocular movements intact.     Pupils: Pupils are equal, round, and reactive to light.  Cardiovascular:     Rate and Rhythm: Normal rate and regular rhythm.     Pulses: Normal pulses.     Heart sounds: Normal heart sounds.  Pulmonary:     Effort: Pulmonary effort is normal.     Breath sounds: No  wheezing.  Abdominal:     General: Bowel sounds are normal.     Palpations: Abdomen is soft.     Tenderness: There is no abdominal tenderness.  Musculoskeletal:        General: No swelling.     Right lower leg: No edema.     Left lower leg: No edema.  Lymphadenopathy:     Cervical: No cervical adenopathy.  Skin:    General: Skin is warm and dry.  Neurological:     General: No focal deficit present.     Mental Status: She is alert and oriented to person, place, and time.  Psychiatric:        Mood and Affect: Mood normal.        Behavior: Behavior normal.     LABORATORY DATA:  I have reviewed the labs as listed.  CBC    Component Value Date/Time   WBC 9.0 07/03/2019 1230   RBC 4.64 07/03/2019 1230   HGB 14.7 07/03/2019 1230   HCT 42.8 07/03/2019 1230   PLT 288 07/03/2019 1230   MCV 92.2 07/03/2019 1230   MCH 31.7 07/03/2019 1230   MCHC 34.3 07/03/2019 1230  RDW 12.4 07/03/2019 1230   LYMPHSABS 2.2 07/03/2019 1230   MONOABS 0.5 07/03/2019 1230   EOSABS 0.3 07/03/2019 1230   BASOSABS 0.1 07/03/2019 1230      Latest Ref Rng & Units 03/31/2022   12:00 AM 12/23/2021   12:00 AM 08/06/2021   12:00 AM  CMP  BUN 4 - 21 16      20      16        Creatinine 0.5 - 1.1 1.3      1.2      1.4       Sodium 137 - 147  139      143       Potassium 3.4 - 5.3  5.0      5.0       Chloride 99 - 108  100      102       CO2 13 - 22  21      26        Calcium 8.7 - 10.7 10.4      10.1      10.8       Alkaline Phos 25 - 125  83      78       AST 13 - 35 39      43      28       ALT 7 - 35 U/L 39      39      28          This result is from an external source.     DIAGNOSTIC IMAGING:  I have independently reviewed the relevant imaging and discussed with the patient.  ASSESSMENT & PLAN: 1.  Hypogammaglobulinemia with recurrent infections - Labs from October 2015 showed IgG 566 [phone]), IgM 28 (57-237), IgA 65, IgE 12 - Evaluated by [facility] by Dr. Lloyd Huger  (10/27/2014), who noted: " A review of Ms. Deeg immune evaluation reveals mild hypogammaglobulinemia (not significant), a low IgM but normal specific antibody titers, normal B cell subsets panels and normal cellular function. Taken together the immune evaluation is normal. I do not believe Ms. Farewell has a functional disorder of her immune system. For the hypo IgM this may become significant if the IgM was less that 15. For this I suggest she have an annual IgG, IgM and IgA. Currently the IgM level is not likely to be significant". - She previously received IVIG, but this was discontinued in August 2016 due to financial reasons - She was previously seen by Dr. Delton Coombes at our clinic, but was lost to follow-up after her appointment on 07/10/2019. - She returned to reestablish care and saw Dr. Lorenso Courier on 07/27/2021. 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 Lorenso Courier, with first dose on 11/03/2021 - She reports that she has been tolerating her IVIG infusions well, no reported adverse events or systemic reactions.     - Over the last 3 months, she has has not had any infections requiring antibiotics - No B symptoms such as fever, chills, night sweats, unintentional weight loss   - Most recent quantitative immunoglobulins (04/07/2022) show IgA 64, IgG 776, and IgM 12.  CBC within normal limits.  CMP ordered, but not checked by home health. - Patient receives her home IVIG infusions via [facility]  infusion pharmacy (contact [place] Sandy at [phone], fax [phone], email [email or web address]) - PLAN: Continue IVIG q. 28 days (via [facility] home health infusion) with every 50-monthclinic visit follow-up. - We will check quantitative immunoglobulins 1 to 2 days before each IVIG treatment.  Therapeutic goal is IgG around 600. - Check CBC  and CMP with home health lab draw in 3 months.  2.  Headaches   - Headaches described in HPI above are unlikely to be caused by IVIG, since they began prior to when she started on IVIG and did not show any temporal relationship to her IVIG infusions. - PLAN: Although her headache pattern seems unlikely to be caused by IVIG, recommend to continue slower rate of IVIG infusion and prehydration with 500 mL NS. - She has also been referred to neurology for work-up of her headaches  3.  Other history - Her past medical history is otherwise notable for fatty liver disease with history of transaminitis, GERD, fibromyalgia, type 2 diabetes mellitus, and IBS   PLAN SUMMARY & DISPOSITION: Monthly IVIG infusions via home health Monthly lab check (quantitative immunoglobulins) via home health Check CBC and CMP in 3 months (home health lab draw) Office visit in 3 months  All questions were answered. The patient knows to call the clinic with any problems, questions or concerns.  Medical decision making: Moderate  Time spent on visit: I spent 20 minutes counseling the patient face to face. The total time spent in the appointment was 30 minutes and more than 50% was on counseling.   RHarriett Rush PA-C  05/05/2022 1:10 PM

## 2022-05-06 DIAGNOSIS — D801 Nonfamilial hypogammaglobulinemia: Secondary | ICD-10-CM | POA: Diagnosis not present

## 2022-05-11 DIAGNOSIS — D801 Nonfamilial hypogammaglobulinemia: Secondary | ICD-10-CM | POA: Diagnosis not present

## 2022-05-12 DIAGNOSIS — M47816 Spondylosis without myelopathy or radiculopathy, lumbar region: Secondary | ICD-10-CM | POA: Diagnosis not present

## 2022-05-16 DIAGNOSIS — R110 Nausea: Secondary | ICD-10-CM | POA: Diagnosis not present

## 2022-05-16 DIAGNOSIS — N390 Urinary tract infection, site not specified: Secondary | ICD-10-CM | POA: Diagnosis not present

## 2022-05-16 DIAGNOSIS — R350 Frequency of micturition: Secondary | ICD-10-CM | POA: Diagnosis not present

## 2022-05-20 ENCOUNTER — Encounter (HOSPITAL_COMMUNITY): Payer: Self-pay | Admitting: Psychiatry

## 2022-05-20 ENCOUNTER — Telehealth (INDEPENDENT_AMBULATORY_CARE_PROVIDER_SITE_OTHER): Payer: HMO | Admitting: Psychiatry

## 2022-05-20 DIAGNOSIS — F319 Bipolar disorder, unspecified: Secondary | ICD-10-CM | POA: Diagnosis not present

## 2022-05-20 MED ORDER — LAMOTRIGINE 25 MG PO TABS: ORAL_TABLET | ORAL | 2 refills | Status: DC

## 2022-05-20 MED ORDER — FLUOXETINE HCL 40 MG PO CAPS: 40.0000 mg | ORAL_CAPSULE | Freq: Every day | ORAL | 2 refills | Status: DC

## 2022-05-20 MED ORDER — AMITRIPTYLINE HCL 25 MG PO TABS: ORAL_TABLET | ORAL | 2 refills | Status: DC

## 2022-05-20 MED ORDER — CLONAZEPAM 0.5 MG PO TABS: 0.5000 mg | ORAL_TABLET | Freq: Two times a day (BID) | ORAL | 2 refills | Status: DC

## 2022-05-20 NOTE — Progress Notes (Signed)
Virtual Visit via Video Note  I connected with Michelle Clements on 05/20/22 at 10:40 AM EDT by a video enabled telemedicine application and verified that I am speaking with the correct person using two identifiers.  Location: Patient: home Provider: home office   I discussed the limitations of evaluation and management by telemedicine and the availability of in person appointments. The patient expressed understanding and agreed to proceed.     I discussed the assessment and treatment plan with the patient. The patient was provided an opportunity to ask questions and all were answered. The patient agreed with the plan and demonstrated an understanding of the instructions.   The patient was advised to call back or seek an in-person evaluation if the symptoms worsen or if the condition fails to improve as anticipated.  I provided 20 minutes of non-face-to-face time during this encounter.   [name], MD  [facility] MD/PA/NP OP Progress Note  05/20/2022 11:21 AM Michelle Clements  MRN:  [id number]  Chief Complaint:  Chief Complaint  Patient presents with   Depression   Anxiety   Follow-up   HPI: This patient is a 56year-old married white female who lives with her husband in [place]. She has two daughters  who live out of the home. 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  She states lately she has had a lot of ups and downs.  She will have short periods of depression lasting hours to a day or so followed by feelings of being very hyper and upbeat.  These do not last very long either.  She states she has a new worry.  Her husband's work schedule is changing.  Instead of being home for 2 days and then on for 24 hours he is going to be home every day at 5:00.  She feels worried about being alone a lot.  The patient is sleeping well her energy is not the best but she tries to stay at busy and active with household chores and friends.  She  denies any thoughts of self-harm or suicide.  She denies racing thoughts or impulsivity.  She is seeing a therapist locally.  I suggested that we add Lamictal to her regimen to help with the mood stabilization and she agrees.  She is generally sleeping well.  Her anxiety is under good control Visit Diagnosis:    ICD-10-CM   1. Bipolar 1 disorder ([place])  F31.9       Past Psychiatric History: Admission earlier this year for overdose/suicide attempt followed by partial hospital program.  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[place])    Depression    Essential hypertension, benign    Fibromyalgia    GERD (gastroesophageal reflux disease)    H/O sleep apnea    Hip pain, left    History of palpitations    Negative Holter monitor   History of pneumonia 1988   Hyperlipidemia    IBS (irritable bowel syndrome)    Neck pain    Poor short term memory    PTSD (post-traumatic stress disorder)  S/P Botox injection 11/2014    For migraine headaches   Type 2 diabetes mellitus ([place])    Urgency of urination     Past Surgical History:  Procedure Laterality Date   CARPAL TUNNEL RELEASE Right 06/10/2013   Procedure: CARPAL TUNNEL RELEASE;  Surgeon: Faythe Ghee, MD;  Location: [facility];  Service: Neurosurgery;  Laterality: Right;  Right Carpal Tunnel Release    CHOLECYSTECTOMY     DENTAL SURGERY  12/2014   mole exc     x 3   MUSCLE BIOPSY  2008   Right leg   TUBAL LIGATION      Family Psychiatric History: see below  Family History:  Family History  Problem Relation Age of Onset   Fibromyalgia Mother    Diabetes type II Mother    Depression Mother    Alzheimer's disease Mother    GER disease Mother    Heart  disease Mother    Paranoid behavior Mother    Dementia Mother    Heart attack Father 31       MI x5   Stroke Father        CVA x7   Asthma Father    Brain cancer Father    Seizures Father    Anxiety disorder Sister    OCD Sister    Sexual abuse Sister    Physical abuse Sister    Anxiety disorder Sister    ADD / ADHD Daughter    ADD / ADHD Daughter    Alcohol abuse Neg Hx    Drug abuse Neg Hx    Schizophrenia Neg Hx     Social History:  Social History   Socioeconomic History   Marital status: Married    Spouse name: Not on file   Number of children: 2   Years of education: College   Highest education level: Associate degree: academic program  Occupational History   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   Caffeine use: 2 cups coffee in morning and 2 cups tea during the day    Social Determinants of Health   Financial Resource Strain: Low Risk  (07/27/2021)   Overall Financial Resource Strain (CARDIA)    Difficulty of Paying Living Expenses: Not very hard  Food Insecurity: No Food Insecurity (07/27/2021)   Hunger Vital Sign    Worried About Running Out of Food in the Last Year: Never true    Ran Out of Food in the Last Year: Never true  Transportation Needs: No Transportation Needs (07/27/2021)   PRAPARE - Hydrologist (Medical): No    Lack of Transportation (Non-Medical): No  Physical Activity: Inactive  (07/27/2021)   Exercise Vital Sign    Days of Exercise per Week: 0 days    Minutes of Exercise per Session: 0 min  Stress: No Stress Concern Present (07/27/2021)   [place]    Feeling of Stress : Not at all  Social Connections: Moderately Integrated (07/27/2021)   Social Connection and Isolation Panel [NHANES]    Frequency of Communication with Friends and Family: More than three times a week    Frequency of Social Gatherings with Friends and Family: More than three times a week    Attends Religious Services: More than 4 times per year    Active Member of [employer] or Organizations: No    Attends Archivist Meetings: Never    Marital Status: Married    Allergies:  Allergies  Allergen Reactions   Molds & Smuts Shortness Of Breath    wheezing   Other Anaphylaxis, Shortness Of Breath and Other (See Comments)     : Angioedema wheezing Wheezing wheezing Other reaction(s): Cough Wheezing Itching and rash Wheezing wheezing Wheezing wheezing Other reaction(s): Other (See Comments)  : Angioedema wheezing Wheezing wheezing Other reaction(s): Cough Wheezing Itching and rash Wheezing wheezing Other reaction(s): Other (See Comments) Wheezing wheezing  : Angioedema wheezing Wheezing wheezing Other reaction(s): Cough Wheezing Itching and rash Wheezing wheezing Wheezing wheezing   Sulfa Antibiotics Anaphylaxis     : Angioedema    Sulfonamide Derivatives Anaphylaxis     : Angioedema   Trichophyton Shortness Of Breath    wheezing wheezing wheezing wheezing wheezing wheezing wheezing wheezing wheezing   Carbamazepine Rash   Dust Mite Extract Cough and Other (See Comments)    Wheezing Other reaction(s): Wheezing Wheezing Wheezing Other reaction(s): Wheezing Wheezing Wheezing Other reaction(s): Wheezing Wheezing   Gluten Meal Itching   Clonazepam Other (See Comments)    Confusion  and memory loss Caused nconfusion    Metabolic Disorder Labs: Lab Results  Component Value Date   HGBA1C 7.4 03/31/2022   MPG 131 03/18/2015   No results found for: "PROLACTIN" Lab Results  Component Value Date   CHOL 196 08/06/2021   TRIG 199 (A) 03/31/2022   HDL 54 08/06/2021   CHOLHDL 4.2 03/18/2015   VLDL 44 (H) 03/18/2015   LDLCALC 126 03/31/2022   LDLCALC 117 08/06/2021   Lab Results  Component Value Date   TSH 2.69 12/29/2020   TSH 1.66 09/14/2020    Therapeutic Level Labs: Lab Results  Component Value Date   LITHIUM 0.20 (L) 02/21/2012   No results found for: "VALPROATE" No results found for: "CBMZ"  Current Medications: Current Outpatient Medications  Medication Sig Dispense Refill   lamoTRIgine (LAMICTAL) 25 MG tablet Take one tablet daily for 2 weeks, then increase to one twice a week 60 tablet 2   albuterol (VENTOLIN HFA) 108 (90 Base) MCG/ACT inhaler albuterol sulfate HFA 90 mcg/actuation aerosol inhaler  INHALE TWO PUFFS BY MOUTH INTO LUNGS EVERY 6 HOURS AS NEEDED     amitriptyline (ELAVIL) 25 MG tablet Take one or two at bedtime 60 tablet 2   Ascorbic Acid (VITAMIN C) 1000 MG tablet Take 1,000 mg by mouth daily.     betamethasone dipropionate 0.05 % cream betamethasone dipropionate 0.05 % topical cream  APPLY A thin layer TO affected area(s) ONCE DAILY AS DIRECTED     Blood Glucose Monitoring Suppl (ONETOUCH VERIO FLEX SYSTEM) w/Device KIT Use as directed to check blood glucose twice daily, before breakfast and before bed. 1 kit 0   cetirizine (ZYRTEC) 10 MG tablet Take 10 mg by mouth at bedtime.     Cholecalciferol (VITAMIN D) 50 MCG (  2000 UT) tablet Take 2,000 Units by mouth daily.     clonazePAM (KLONOPIN) 0.5 MG tablet Take 1 tablet (0.5 mg total) by mouth 2 (two) times daily. 60 tablet 2   cycloSPORINE (RESTASIS) 0.05 % ophthalmic emulsion Apply to eye.     estradiol (ESTRACE) 0.1 MG/GM vaginal cream Place vaginally.     FLUoxetine (PROZAC) 40 MG  capsule Take 1 capsule (40 mg total) by mouth daily. 30 capsule 2   fluticasone (FLONASE) 50 MCG/ACT nasal spray Place 1 spray into both nostrils daily.     GAMUNEX-C 20 GM/200ML SOLN      Glucos-Chond-Hyal Ac-Ca Fructo (MOVE FREE JOINT HEALTH ADVANCE PO) Take by mouth.     ibuprofen (ADVIL) 800 MG tablet SMARTSIG:1 Tablet(s) By Mouth Morning-Night     insulin glargine (LANTUS SOLOSTAR) 100 UNIT/ML Solostar Pen Inject 45 Units into the skin daily. 45 mL 3   Lancets (ONETOUCH ULTRASOFT) lancets 1 each by Other route as needed.      lidocaine (LIDODERM) 5 % lidocaine 5 % topical patch  APPLY 1 PATCH BY TOPICAL ROUTE ONCE DAILY (MAY WEAR UP TO 12HOURS.)     lisinopril (ZESTRIL) 5 MG tablet lisinopril 5 mg tablet  Take 1 tablet every day by oral route.     Melatonin 5 MG CAPS      Multiple Vitamin (MULTIVITAMIN) tablet Take 1 tablet by mouth daily.     omeprazole (PRILOSEC) 20 MG capsule Take 20 mg by mouth daily.     ONETOUCH VERIO test strip 4 (four) times daily.     pregabalin (LYRICA) 200 MG capsule Take 200 mg by mouth 3 (three) times daily.     rosuvastatin (CRESTOR) 20 MG tablet TAKE ONE TABLET BY MOUTH EVERYDAY AT BEDTIME 90 tablet 0   sennosides-docusate sodium (SENOKOT-S) 8.6-50 MG tablet Take 1 tablet by mouth daily.     tirzepatide (MOUNJARO) 7.5 MG/0.5ML Pen Inject 7.5 mg into the skin once a week. (Patient taking differently: Inject into the skin once a week.) 6 mL 3   tiZANidine (ZANAFLEX) 4 MG tablet Take 4 mg by mouth daily. 57m at bedtime and 2 mg during the day     traMADol (ULTRAM) 50 MG tablet Take 100 mg by mouth every 8 (eight) hours as needed.     zinc gluconate 50 MG tablet Take 50 mg by mouth daily.     No current facility-administered medications for this visit.     Musculoskeletal: Strength & Muscle Tone: within normal limits Gait & Station: normal Patient leans: N/A  Psychiatric Specialty Exam: Review of Systems  Psychiatric/Behavioral:  Positive for  dysphoric mood.   All other systems reviewed and are negative.   Last menstrual period 01/04/2018.There is no height or weight on file to calculate BMI.  General Appearance: Casual and Fairly Groomed  Eye Contact:  Good  Speech:  Clear and Coherent  Volume:  Normal  Mood:  Dysphoric  Affect:  Appropriate and Congruent  Thought Process:  Goal Directed  Orientation:  Full (Time, Place, and Person)  Thought Content: Rumination   Suicidal Thoughts:  No  Homicidal Thoughts:  No  Memory:  Immediate;   Good Recent;   Good Remote;   Fair  Judgement:  Good  Insight:  Good  Psychomotor Activity:  Decreased  Concentration:  Concentration: Poor and Attention Span: Poor  Recall:  Good  Fund of Knowledge: Good  Language: Good  Akathisia:  No  Handed:  Right  AIMS (if indicated): not  done  Assets:  Communication Skills Desire for Improvement Resilience Social Support Talents/Skills  ADL's:  Intact  Cognition: WNL  Sleep:  Good   Screenings: Mini-Mental    Flowsheet Row Office Visit from 08/29/2019 in [facility]  Total Score (max 30 points ) 27      PHQ2-9    Flowsheet Row Video Visit from 05/20/2022 in [facility] Video Visit from 03/16/2022 in [facility] Video Visit from 01/19/2022 in [facility] Counselor from 12/14/2021 in [facility] Video Visit from 11/22/2021 in [facility]  PHQ-2 Total Score 2 0 0 2 0  PHQ-9 Total Score 7 0 4 10 --      Flowsheet Row Video Visit from 03/16/2022 in [place] [name] Video Visit from 01/19/2022 in [facility] Video Visit from 11/22/2021 in [place] Error: Q3, 4, or 5  should not be populated when Q2 is No Error: Q3, 4, or 5 should not be populated when Q2 is No Error: Q3, 4, or 5 should not be populated when Q2 is No        Assessment and Plan: This patient is a 56 year old female with a history of PTSD depression anxiety hallucinations.  What she is describing now sounds more like bipolar 2 disorder.  Therefore we will add Lamictal 25 mg to begin with for 2 weeks and then advance to 25 mg twice daily for mood stabilization.  She will also continue amitriptyline 25 mg at bedtime for sleep clonazepam 0.5 mg twice daily for anxiety and 40 mg daily for depression.  She will return to see me in 4 weeks  Collaboration of Care: Collaboration of Care: Primary Care Provider AEB notes will be shared with PCP at patient's request  Patient/Guardian was advised Release of Information must be obtained prior to any record release in order to collaborate their care with an outside provider. Patient/Guardian was advised if they have not already done so to contact the registration department to sign all necessary forms in order for [place] to release information regarding their care.   Consent: Patient/Guardian gives verbal consent for treatment and assignment of benefits for services provided during this visit. Patient/Guardian expressed understanding and agreed to proceed.    [name], MD 05/20/2022, 11:21 AM

## 2022-05-27 ENCOUNTER — Encounter: Payer: Self-pay | Admitting: Hematology

## 2022-06-03 DIAGNOSIS — E1165 Type 2 diabetes mellitus with hyperglycemia: Secondary | ICD-10-CM | POA: Diagnosis not present

## 2022-06-03 DIAGNOSIS — N1831 Chronic kidney disease, stage 3a: Secondary | ICD-10-CM | POA: Diagnosis not present

## 2022-06-03 DIAGNOSIS — I129 Hypertensive chronic kidney disease with stage 1 through stage 4 chronic kidney disease, or unspecified chronic kidney disease: Secondary | ICD-10-CM | POA: Diagnosis not present

## 2022-06-03 DIAGNOSIS — E782 Mixed hyperlipidemia: Secondary | ICD-10-CM | POA: Diagnosis not present

## 2022-06-06 DIAGNOSIS — D801 Nonfamilial hypogammaglobulinemia: Secondary | ICD-10-CM | POA: Diagnosis not present

## 2022-06-15 ENCOUNTER — Encounter (HOSPITAL_COMMUNITY): Payer: Self-pay | Admitting: Hematology & Oncology

## 2022-06-15 DIAGNOSIS — F319 Bipolar disorder, unspecified: Secondary | ICD-10-CM | POA: Diagnosis not present

## 2022-06-16 ENCOUNTER — Other Ambulatory Visit: Payer: Self-pay

## 2022-06-17 ENCOUNTER — Telehealth: Payer: Self-pay | Admitting: Nurse Practitioner

## 2022-06-17 NOTE — Telephone Encounter (Signed)
Judson Roch called from [facility] and states she is needing more information before deciding on the appeal for [facility]. Requesting a call back [phone]

## 2022-06-17 NOTE — Telephone Encounter (Signed)
Spoke to Judson Roch and they will continue with the appeal by sending it to their Market researcher

## 2022-06-20 ENCOUNTER — Telehealth (INDEPENDENT_AMBULATORY_CARE_PROVIDER_SITE_OTHER): Payer: HMO | Admitting: Psychiatry

## 2022-06-20 ENCOUNTER — Encounter (HOSPITAL_COMMUNITY): Payer: Self-pay | Admitting: Psychiatry

## 2022-06-20 DIAGNOSIS — F319 Bipolar disorder, unspecified: Secondary | ICD-10-CM | POA: Diagnosis not present

## 2022-06-20 DIAGNOSIS — F332 Major depressive disorder, recurrent severe without psychotic features: Secondary | ICD-10-CM

## 2022-06-20 MED ORDER — LAMOTRIGINE 25 MG PO TABS: ORAL_TABLET | ORAL | 2 refills | Status: DC

## 2022-06-20 MED ORDER — FLUOXETINE HCL 40 MG PO CAPS: 40.0000 mg | ORAL_CAPSULE | Freq: Every day | ORAL | 2 refills | Status: DC

## 2022-06-20 MED ORDER — AMITRIPTYLINE HCL 25 MG PO TABS: ORAL_TABLET | ORAL | 2 refills | Status: DC

## 2022-06-20 MED ORDER — CLONAZEPAM 0.5 MG PO TABS
0.5000 mg | ORAL_TABLET | Freq: Two times a day (BID) | ORAL | 2 refills | Status: DC
Start: 2022-06-20 — End: 2022-08-23

## 2022-06-20 NOTE — Progress Notes (Signed)
Virtual Visit via Video Note  I connected with Michelle Clements on 06/20/22 at 10:40 AM EDT by a video enabled telemedicine application and verified that I am speaking with the correct person using two identifiers.  Location: Patient: home Provider: office   I discussed the limitations of evaluation and management by telemedicine and the availability of in person appointments. The patient expressed understanding and agreed to proceed.      I discussed the assessment and treatment plan with the patient. The patient was provided an opportunity to ask questions and all were answered. The patient agreed with the plan and demonstrated an understanding of the instructions.   The patient was advised to call back or seek an in-person evaluation if the symptoms worsen or if the condition fails to improve as anticipated.  I provided 15 minutes of non-face-to-face time during this encounter.   [name], MD  [facility] MD/PA/NP OP Progress Note  06/20/2022 11:09 AM Michelle Clements  MRN:  [id number]  Chief Complaint:  Chief Complaint  Patient presents with   Anxiety   Depression   Manic Behavior   Follow-up   HPI: This patient is a 56year-old married white female who lives with her husband in [place]. She has two daughters  who live out of the home. 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  Last time she was having a lot's of mood swings going up and down that lasted about several hours each way.  We started lamotrigine and it seems to have helped quite a bit.  She states that she is doing much better now and her moods are more even.  Her husband is getting a new job as a Patent examiner so he will be working days and not doing 2448-hour shifts.  This is both good and bad as she will not have as much time to herself.  Nevertheless she is doing more things with friends getting out more and is getting more active.  She had a back injection  which is helped her mobility.  She is sleeping well and her energy is improving and she seems very upbeat today. Visit Diagnosis:    ICD-10-CM   1. Bipolar 1 disorder (HCC)  F31.9     2. Severe episode of recurrent major depressive disorder, without psychotic features ([place])  F33.2       Past Psychiatric History: Admission earlier this year for overdose/suicide attempt followed by partial hospital program.  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[place])    Depression    Essential hypertension, benign    Fibromyalgia    GERD (gastroesophageal reflux disease)    H/O sleep apnea    Hip pain, left    History of palpitations    Negative Holter monitor   History of pneumonia 1988   Hyperlipidemia    IBS (irritable bowel syndrome)    Neck pain    Poor short term memory    PTSD (post-traumatic stress disorder)    S/P Botox injection 11/2014    For migraine headaches   Type 2 diabetes mellitus (  [place])    Urgency of urination     Past Surgical History:  Procedure Laterality Date   CARPAL TUNNEL RELEASE Right 06/10/2013   Procedure: CARPAL TUNNEL RELEASE;  Surgeon: Faythe Ghee, MD;  Location: [facility];  Service: Neurosurgery;  Laterality: Right;  Right Carpal Tunnel Release    CHOLECYSTECTOMY     DENTAL SURGERY  12/2014   mole exc     x 3   MUSCLE BIOPSY  2008   Right leg   TUBAL LIGATION      Family Psychiatric History: See below  Family History:  Family History  Problem Relation Age of Onset   Fibromyalgia Mother    Diabetes type II Mother    Depression Mother    Alzheimer's disease Mother    GER disease Mother    Heart disease Mother    Paranoid behavior Mother    Dementia Mother    Heart attack  Father 59       MI x5   Stroke Father        CVA x7   Asthma Father    Brain cancer Father    Seizures Father    Anxiety disorder Sister    OCD Sister    Sexual abuse Sister    Physical abuse Sister    Anxiety disorder Sister    ADD / ADHD Daughter    ADD / ADHD Daughter    Alcohol abuse Neg Hx    Drug abuse Neg Hx    Schizophrenia Neg Hx     Social History:  Social History   Socioeconomic History   Marital status: Married    Spouse name: Not on file   Number of children: 2   Years of education: College   Highest education level: Associate degree: academic program  Occupational History   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   Caffeine use: 2 cups coffee in morning and 2 cups tea during the day    Social Determinants of Health   Financial Resource Strain: Low Risk  (07/27/2021)   Overall Financial Resource Strain (CARDIA)    Difficulty of Paying Living Expenses: Not very hard  Food Insecurity: No Food Insecurity (07/27/2021)   Hunger Vital Sign    Worried About Running Out of Food in the Last Year: Never true    Ran Out of Food in the Last Year: Never true  Transportation Needs: No Transportation Needs (07/27/2021)   PRAPARE - Hydrologist (Medical): No    Lack of Transportation (Non-Medical): No  Physical Activity: Inactive (07/27/2021)   Exercise Vital Sign    Days of Exercise per Week: 0 days    Minutes  of Exercise per Session: 0 min  Stress: No Stress Concern Present (07/27/2021)   [place]  Stress Questionnaire    Feeling of Stress : Not at all  Social Connections: Moderately Integrated (07/27/2021)   Social Connection and Isolation Panel [NHANES]    Frequency of Communication with Friends and Family: More than three times a week    Frequency of Social Gatherings with Friends and Family: More than three times a week    Attends Religious Services: More than 4 times per year    Active Member of [employer] or Organizations: No    Attends Archivist Meetings: Never    Marital Status: Married    Allergies:  Allergies  Allergen Reactions   Molds & Smuts Shortness Of Breath    wheezing   Other Anaphylaxis, Shortness Of Breath and Other (See Comments)     : Angioedema wheezing Wheezing wheezing Other reaction(s): Cough Wheezing Itching and rash Wheezing wheezing Wheezing wheezing Other reaction(s): Other (See Comments)  : Angioedema wheezing Wheezing wheezing Other reaction(s): Cough Wheezing Itching and rash Wheezing wheezing Other reaction(s): Other (See Comments) Wheezing wheezing  : Angioedema wheezing Wheezing wheezing Other reaction(s): Cough Wheezing Itching and rash Wheezing wheezing Wheezing wheezing   Sulfa Antibiotics Anaphylaxis     : Angioedema    Sulfonamide Derivatives Anaphylaxis     : Angioedema   Trichophyton Shortness Of Breath    wheezing wheezing wheezing wheezing wheezing wheezing wheezing wheezing wheezing   Carbamazepine Rash   Dust Mite Extract Cough and Other (See Comments)    Wheezing Other reaction(s): Wheezing Wheezing Wheezing Other reaction(s): Wheezing Wheezing Wheezing Other reaction(s): Wheezing Wheezing   Gluten Meal Itching   Clonazepam Other (See Comments)    Confusion and memory loss Caused nconfusion    Metabolic Disorder Labs: Lab Results   Component Value Date   HGBA1C 7.4 03/31/2022   MPG 131 03/18/2015   No results found for: "PROLACTIN" Lab Results  Component Value Date   CHOL 196 08/06/2021   TRIG 199 (A) 03/31/2022   HDL 54 08/06/2021   CHOLHDL 4.2 03/18/2015   VLDL 44 (H) 03/18/2015   LDLCALC 126 03/31/2022   LDLCALC 117 08/06/2021   Lab Results  Component Value Date   TSH 2.69 12/29/2020   TSH 1.66 09/14/2020    Therapeutic Level Labs: Lab Results  Component Value Date   LITHIUM 0.20 (L) 02/21/2012   No results found for: "VALPROATE" No results found for: "CBMZ"  Current Medications: Current Outpatient Medications  Medication Sig Dispense Refill   albuterol (VENTOLIN HFA) 108 (90 Base) MCG/ACT inhaler albuterol sulfate HFA 90 mcg/actuation aerosol inhaler  INHALE TWO PUFFS BY MOUTH INTO LUNGS EVERY 6 HOURS AS NEEDED     amitriptyline (ELAVIL) 25 MG tablet Take one or two at bedtime 60 tablet 2   Ascorbic Acid (VITAMIN C) 1000 MG tablet Take 1,000 mg by mouth daily.     betamethasone dipropionate 0.05 % cream betamethasone dipropionate 0.05 % topical cream  APPLY A thin layer TO affected area(s) ONCE DAILY AS DIRECTED     Blood Glucose Monitoring Suppl (ONETOUCH VERIO FLEX SYSTEM) w/Device KIT Use as directed to check blood glucose twice daily, before breakfast and before bed. 1 kit 0   cetirizine (ZYRTEC) 10 MG tablet Take 10 mg by mouth at bedtime.     Cholecalciferol (VITAMIN D) 50 MCG (2000 UT) tablet Take 2,000 Units by mouth daily.     clonazePAM (KLONOPIN) 0.5 MG tablet Take 1 tablet (0.5 mg total) by mouth 2 (two) times daily. 60 tablet 2   cycloSPORINE (RESTASIS) 0.05 %  ophthalmic emulsion Apply to eye.     estradiol (ESTRACE) 0.1 MG/GM vaginal cream Place vaginally.     FLUoxetine (PROZAC) 40 MG capsule Take 1 capsule (40 mg total) by mouth daily. 30 capsule 2   fluticasone (FLONASE) 50 MCG/ACT nasal spray Place 1 spray into both nostrils daily.     GAMUNEX-C 20 GM/200ML SOLN       Glucos-Chond-Hyal Ac-Ca Fructo (MOVE FREE JOINT HEALTH ADVANCE PO) Take by mouth.     ibuprofen (ADVIL) 800 MG tablet SMARTSIG:1 Tablet(s) By Mouth Morning-Night     insulin glargine (LANTUS SOLOSTAR) 100 UNIT/ML Solostar Pen Inject 45 Units into the skin daily. 45 mL 3   lamoTRIgine (LAMICTAL) 25 MG tablet Take one tablet daily for 2 weeks, then increase to one twice a week 60 tablet 2   Lancets (ONETOUCH ULTRASOFT) lancets 1 each by Other route as needed.      lidocaine (LIDODERM) 5 % lidocaine 5 % topical patch  APPLY 1 PATCH BY TOPICAL ROUTE ONCE DAILY (MAY WEAR UP TO 12HOURS.)     lisinopril (ZESTRIL) 5 MG tablet lisinopril 5 mg tablet  Take 1 tablet every day by oral route.     Melatonin 5 MG CAPS      Multiple Vitamin (MULTIVITAMIN) tablet Take 1 tablet by mouth daily.     omeprazole (PRILOSEC) 20 MG capsule Take 20 mg by mouth daily.     ONETOUCH VERIO test strip 4 (four) times daily.     pregabalin (LYRICA) 200 MG capsule Take 200 mg by mouth 3 (three) times daily.     rosuvastatin (CRESTOR) 20 MG tablet TAKE ONE TABLET BY MOUTH EVERYDAY AT BEDTIME 90 tablet 0   sennosides-docusate sodium (SENOKOT-S) 8.6-50 MG tablet Take 1 tablet by mouth daily.     tirzepatide (MOUNJARO) 7.5 MG/0.5ML Pen Inject 7.5 mg into the skin once a week. (Patient taking differently: Inject into the skin once a week.) 6 mL 3   tiZANidine (ZANAFLEX) 4 MG tablet Take 4 mg by mouth daily. 71m at bedtime and 2 mg during the day     traMADol (ULTRAM) 50 MG tablet Take 100 mg by mouth every 8 (eight) hours as needed.     zinc gluconate 50 MG tablet Take 50 mg by mouth daily.     No current facility-administered medications for this visit.     Musculoskeletal: Strength & Muscle Tone: within normal limits Gait & Station: normal Patient leans: N/A  Psychiatric Specialty Exam: Review of Systems  Musculoskeletal:  Positive for arthralgias and back pain.  All other systems reviewed and are negative.   Last  menstrual period 01/04/2018.There is no height or weight on file to calculate BMI.  General Appearance: Casual and Fairly Groomed  Eye Contact:  Good  Speech:  Clear and Coherent  Volume:  Normal  Mood:  Euthymic  Affect:  Appropriate and Congruent  Thought Process:  Goal Directed  Orientation:  Full (Time, Place, and Person)  Thought Content: WDL   Suicidal Thoughts:  No  Homicidal Thoughts:  No  Memory:  Immediate;   Good Recent;   Good Remote;   Good  Judgement:  Good  Insight:  Fair  Psychomotor Activity:  Normal  Concentration:  Concentration: Good and Attention Span: Good  Recall:  Good  Fund of Knowledge: Good  Language: Good  Akathisia:  No  Handed:  Right  AIMS (if indicated): not done  Assets:  Communication Skills Desire for Improvement Resilience Social Support Talents/Skills  ADL's:  Intact  Cognition: WNL  Sleep:  Good   Screenings: Mini-Mental    Flowsheet Row Office Visit from 08/29/2019 in [facility]  Total Score (max 30 points ) 27      PHQ2-9    Flowsheet Row Video Visit from 06/20/2022 in [facility] Video Visit from 05/20/2022 in [facility] Video Visit from 03/16/2022 in [facility] Video Visit from 01/19/2022 in [facility] Counselor from 12/14/2021 in [facility]  PHQ-2 Total Score 0 2 0 0 2  PHQ-9 Total Score -- 7 0 4 10      Flowsheet Row Video Visit from 06/20/2022 in [facility] Video Visit from 03/16/2022 in [facility] Video Visit from 01/19/2022 in [facility] No Risk Error: Q3, 4, or 5 should not be populated when Q2 is No Error: Q3, 4, or 5 should not be populated  when Q2 is No        Assessment and Plan: This patient is a 56 year old female with a history of PTSD depression anxiety and hallucinations.  She probably qualifies for bipolar 2 disorder.  She is doing better in terms of mood swings with the Lamictal 25 mg twice daily so this will be continued.  She will continue amitriptyline 25 mg at bedtime for sleep, clonazepam 0.5 mg twice daily for anxiety and Prozac 40 mg daily for depression.  She will return to see me in 2 months  Collaboration of Care: Collaboration of Care: Primary Care Provider AEB notes will be shared with PCP at patient's request  Patient/Guardian was advised Release of Information must be obtained prior to any record release in order to collaborate their care with an outside provider. Patient/Guardian was advised if they have not already done so to contact the registration department to sign all necessary forms in order for [place] to release information regarding their care.   Consent: Patient/Guardian gives verbal consent for treatment and assignment of benefits for services provided during this visit. Patient/Guardian expressed understanding and agreed to proceed.    [name], MD 06/20/2022, 11:09 AM

## 2022-07-04 DIAGNOSIS — N1831 Chronic kidney disease, stage 3a: Secondary | ICD-10-CM | POA: Diagnosis not present

## 2022-07-04 DIAGNOSIS — D801 Nonfamilial hypogammaglobulinemia: Secondary | ICD-10-CM | POA: Diagnosis not present

## 2022-07-04 DIAGNOSIS — S338XXA Sprain of other parts of lumbar spine and pelvis, initial encounter: Secondary | ICD-10-CM | POA: Diagnosis not present

## 2022-07-04 DIAGNOSIS — I129 Hypertensive chronic kidney disease with stage 1 through stage 4 chronic kidney disease, or unspecified chronic kidney disease: Secondary | ICD-10-CM | POA: Diagnosis not present

## 2022-07-04 DIAGNOSIS — S233XXA Sprain of ligaments of thoracic spine, initial encounter: Secondary | ICD-10-CM | POA: Diagnosis not present

## 2022-07-04 DIAGNOSIS — E782 Mixed hyperlipidemia: Secondary | ICD-10-CM | POA: Diagnosis not present

## 2022-07-04 DIAGNOSIS — E1165 Type 2 diabetes mellitus with hyperglycemia: Secondary | ICD-10-CM | POA: Diagnosis not present

## 2022-07-04 DIAGNOSIS — S134XXA Sprain of ligaments of cervical spine, initial encounter: Secondary | ICD-10-CM | POA: Diagnosis not present

## 2022-07-05 DIAGNOSIS — F319 Bipolar disorder, unspecified: Secondary | ICD-10-CM | POA: Diagnosis not present

## 2022-07-06 DIAGNOSIS — M47816 Spondylosis without myelopathy or radiculopathy, lumbar region: Secondary | ICD-10-CM | POA: Diagnosis not present

## 2022-07-06 DIAGNOSIS — M4722 Other spondylosis with radiculopathy, cervical region: Secondary | ICD-10-CM | POA: Diagnosis not present

## 2022-07-06 DIAGNOSIS — M797 Fibromyalgia: Secondary | ICD-10-CM | POA: Diagnosis not present

## 2022-07-06 DIAGNOSIS — M461 Sacroiliitis, not elsewhere classified: Secondary | ICD-10-CM | POA: Diagnosis not present

## 2022-07-12 DIAGNOSIS — S233XXA Sprain of ligaments of thoracic spine, initial encounter: Secondary | ICD-10-CM | POA: Diagnosis not present

## 2022-07-12 DIAGNOSIS — S338XXA Sprain of other parts of lumbar spine and pelvis, initial encounter: Secondary | ICD-10-CM | POA: Diagnosis not present

## 2022-07-12 DIAGNOSIS — S134XXA Sprain of ligaments of cervical spine, initial encounter: Secondary | ICD-10-CM | POA: Diagnosis not present

## 2022-07-13 DIAGNOSIS — D801 Nonfamilial hypogammaglobulinemia: Secondary | ICD-10-CM | POA: Diagnosis not present

## 2022-07-14 DIAGNOSIS — E559 Vitamin D deficiency, unspecified: Secondary | ICD-10-CM | POA: Diagnosis not present

## 2022-07-14 DIAGNOSIS — E782 Mixed hyperlipidemia: Secondary | ICD-10-CM | POA: Diagnosis not present

## 2022-07-14 DIAGNOSIS — E1169 Type 2 diabetes mellitus with other specified complication: Secondary | ICD-10-CM | POA: Diagnosis not present

## 2022-07-19 DIAGNOSIS — N3010 Interstitial cystitis (chronic) without hematuria: Secondary | ICD-10-CM | POA: Diagnosis not present

## 2022-07-19 DIAGNOSIS — E1169 Type 2 diabetes mellitus with other specified complication: Secondary | ICD-10-CM | POA: Diagnosis not present

## 2022-07-19 DIAGNOSIS — R945 Abnormal results of liver function studies: Secondary | ICD-10-CM | POA: Diagnosis not present

## 2022-07-19 DIAGNOSIS — J4540 Moderate persistent asthma, uncomplicated: Secondary | ICD-10-CM | POA: Diagnosis not present

## 2022-07-19 DIAGNOSIS — E782 Mixed hyperlipidemia: Secondary | ICD-10-CM | POA: Diagnosis not present

## 2022-07-19 DIAGNOSIS — N1831 Chronic kidney disease, stage 3a: Secondary | ICD-10-CM | POA: Diagnosis not present

## 2022-07-19 DIAGNOSIS — F319 Bipolar disorder, unspecified: Secondary | ICD-10-CM | POA: Diagnosis not present

## 2022-07-19 DIAGNOSIS — M797 Fibromyalgia: Secondary | ICD-10-CM | POA: Diagnosis not present

## 2022-07-19 DIAGNOSIS — D849 Immunodeficiency, unspecified: Secondary | ICD-10-CM | POA: Diagnosis not present

## 2022-07-19 DIAGNOSIS — G4700 Insomnia, unspecified: Secondary | ICD-10-CM | POA: Insufficient documentation

## 2022-07-19 DIAGNOSIS — G894 Chronic pain syndrome: Secondary | ICD-10-CM | POA: Diagnosis not present

## 2022-07-19 DIAGNOSIS — E559 Vitamin D deficiency, unspecified: Secondary | ICD-10-CM | POA: Diagnosis not present

## 2022-07-20 ENCOUNTER — Encounter (HOSPITAL_COMMUNITY): Payer: Self-pay

## 2022-07-20 ENCOUNTER — Encounter: Payer: Self-pay | Admitting: Nurse Practitioner

## 2022-07-20 NOTE — Telephone Encounter (Signed)
Its okay to keep off the amytriptyline for now.Make sure you have a follow up appt scheduled

## 2022-07-22 DIAGNOSIS — T50902D Poisoning by unspecified drugs, medicaments and biological substances, intentional self-harm, subsequent encounter: Secondary | ICD-10-CM | POA: Diagnosis not present

## 2022-07-22 DIAGNOSIS — Z8669 Personal history of other diseases of the nervous system and sense organs: Secondary | ICD-10-CM | POA: Diagnosis not present

## 2022-07-22 DIAGNOSIS — R0683 Snoring: Secondary | ICD-10-CM | POA: Diagnosis not present

## 2022-07-22 DIAGNOSIS — F3170 Bipolar disorder, currently in remission, most recent episode unspecified: Secondary | ICD-10-CM | POA: Diagnosis not present

## 2022-07-22 DIAGNOSIS — D839 Common variable immunodeficiency, unspecified: Secondary | ICD-10-CM | POA: Diagnosis not present

## 2022-07-26 DIAGNOSIS — F319 Bipolar disorder, unspecified: Secondary | ICD-10-CM | POA: Diagnosis not present

## 2022-08-02 DIAGNOSIS — M5412 Radiculopathy, cervical region: Secondary | ICD-10-CM | POA: Diagnosis not present

## 2022-08-03 DIAGNOSIS — E782 Mixed hyperlipidemia: Secondary | ICD-10-CM | POA: Diagnosis not present

## 2022-08-03 DIAGNOSIS — I129 Hypertensive chronic kidney disease with stage 1 through stage 4 chronic kidney disease, or unspecified chronic kidney disease: Secondary | ICD-10-CM | POA: Diagnosis not present

## 2022-08-03 DIAGNOSIS — E1165 Type 2 diabetes mellitus with hyperglycemia: Secondary | ICD-10-CM | POA: Diagnosis not present

## 2022-08-03 DIAGNOSIS — N1831 Chronic kidney disease, stage 3a: Secondary | ICD-10-CM | POA: Diagnosis not present

## 2022-08-04 ENCOUNTER — Other Ambulatory Visit (HOSPITAL_COMMUNITY): Payer: Self-pay | Admitting: Neurology

## 2022-08-04 ENCOUNTER — Ambulatory Visit (HOSPITAL_COMMUNITY)
Admission: RE | Admit: 2022-08-04 | Discharge: 2022-08-04 | Disposition: A | Discharge status: Home / Self Care | Payer: PPO | Source: Ambulatory Visit | Attending: Neurology | Admitting: Neurology

## 2022-08-04 ENCOUNTER — Encounter (HOSPITAL_COMMUNITY): Payer: Self-pay | Admitting: Hematology & Oncology

## 2022-08-04 ENCOUNTER — Ambulatory Visit: Payer: HMO | Admitting: Nurse Practitioner

## 2022-08-04 DIAGNOSIS — M542 Cervicalgia: Secondary | ICD-10-CM

## 2022-08-04 DIAGNOSIS — M25511 Pain in right shoulder: Secondary | ICD-10-CM

## 2022-08-04 DIAGNOSIS — Z6834 Body mass index (BMI) 34.0-34.9, adult: Secondary | ICD-10-CM | POA: Diagnosis not present

## 2022-08-04 DIAGNOSIS — G4733 Obstructive sleep apnea (adult) (pediatric): Secondary | ICD-10-CM | POA: Diagnosis not present

## 2022-08-04 DIAGNOSIS — J019 Acute sinusitis, unspecified: Secondary | ICD-10-CM | POA: Diagnosis not present

## 2022-08-04 DIAGNOSIS — J4540 Moderate persistent asthma, uncomplicated: Secondary | ICD-10-CM | POA: Diagnosis not present

## 2022-08-04 DIAGNOSIS — I10 Essential (primary) hypertension: Secondary | ICD-10-CM | POA: Diagnosis not present

## 2022-08-08 NOTE — Progress Notes (Deleted)
NO SHOW

## 2022-08-09 ENCOUNTER — Inpatient Hospital Stay: Payer: HMO | Attending: Physician Assistant | Admitting: Physician Assistant

## 2022-08-09 ENCOUNTER — Encounter (HOSPITAL_COMMUNITY): Payer: Self-pay | Admitting: Hematology & Oncology

## 2022-08-09 DIAGNOSIS — J328 Other chronic sinusitis: Secondary | ICD-10-CM | POA: Insufficient documentation

## 2022-08-09 DIAGNOSIS — D801 Nonfamilial hypogammaglobulinemia: Secondary | ICD-10-CM | POA: Insufficient documentation

## 2022-08-09 DIAGNOSIS — N390 Urinary tract infection, site not specified: Secondary | ICD-10-CM | POA: Insufficient documentation

## 2022-08-10 DIAGNOSIS — D801 Nonfamilial hypogammaglobulinemia: Secondary | ICD-10-CM | POA: Diagnosis not present

## 2022-08-11 ENCOUNTER — Ambulatory Visit (INDEPENDENT_AMBULATORY_CARE_PROVIDER_SITE_OTHER): Payer: HMO | Admitting: Nurse Practitioner

## 2022-08-11 ENCOUNTER — Encounter: Payer: Self-pay | Admitting: Nurse Practitioner

## 2022-08-11 VITALS — BP 114/80 | HR 78 | Ht 71.0 in | Wt 243.0 lb

## 2022-08-11 DIAGNOSIS — Z794 Long term (current) use of insulin: Secondary | ICD-10-CM

## 2022-08-11 DIAGNOSIS — E1165 Type 2 diabetes mellitus with hyperglycemia: Secondary | ICD-10-CM | POA: Diagnosis not present

## 2022-08-11 LAB — POCT GLYCOSYLATED HEMOGLOBIN (HGB A1C): Hemoglobin A1C: 5.7 % — AB (ref 4.0–5.6)

## 2022-08-11 NOTE — Progress Notes (Signed)
Endocrinology Follow Up Visit       08/11/2022, 11:14 AM   Subjective:    Patient ID: Michelle Clements, female    DOB: [birth year].  Michelle Clements is being seen in follow up after being seen in consultation for management of currently uncontrolled symptomatic diabetes requested by  [name], MD.   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[employer])    Depression    Essential hypertension, benign    Fibromyalgia    GERD (gastroesophageal reflux disease)    H/O sleep apnea    Hip pain, left    History of palpitations    Negative Holter monitor   History of pneumonia 1988   Hyperlipidemia    IBS (irritable bowel syndrome)    Neck pain    Poor short term memory    PTSD (post-traumatic stress disorder)    S/P Botox injection 11/2014    For migraine headaches   Type 2 diabetes mellitus ([place])    Urgency of urination     Past Surgical History:  Procedure Laterality Date   CARPAL TUNNEL RELEASE Right 06/10/2013   Procedure: CARPAL TUNNEL RELEASE;  Surgeon: Faythe Ghee, MD;  Location: [facility];  Service: Neurosurgery;  Laterality: Right;  Right Carpal Tunnel Release    CHOLECYSTECTOMY     DENTAL SURGERY  12/2014   mole exc     x 3   MUSCLE BIOPSY  2008   Right leg   TUBAL LIGATION      Social History   Socioeconomic History   Marital status: Married    Spouse name: Not on file   Number of children: 2   Years of education: College   Highest education level: Associate degree: academic program  Occupational History   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   Caffeine use: 2 cups coffee in morning and 2 cups tea during the day    Social Determinants of Health   Financial Resource Strain: Low Risk  (07/27/2021)   Overall Financial Resource Strain (CARDIA)    Difficulty of Paying Living Expenses: Not very hard  Food Insecurity: No Food Insecurity (07/27/2021)   Hunger Vital Sign    Worried About Running Out of Food in the  Last Year: Never true    [place] in the Last Year: Never true  Transportation Needs: No Transportation Needs (07/27/2021)   PRAPARE - Hydrologist (Medical): No    Lack of Transportation (Non-Medical): No  Physical Activity: Inactive (07/27/2021)   Exercise Vital Sign    Days of Exercise per Week: 0 days    Minutes of Exercise per Session: 0 min  Stress: No Stress Concern Present (07/27/2021)   [place]    Feeling of Stress : Not at all  Social Connections: Moderately Integrated (07/27/2021)   Social Connection and Isolation Panel [NHANES]    Frequency of Communication with Friends and Family: More than three times a week    Frequency of Social Gatherings with Friends and Family: More than three times a week    Attends Religious Services: More than 4 times per year    Active Member of [employer] or Organizations: No    Attends Music therapist: Never    Marital Status: Married    Family History  Problem Relation Age of Onset   Fibromyalgia Mother    Diabetes type II Mother    Depression Mother    Alzheimer's disease Mother     GER disease Mother    Heart disease Mother    Paranoid behavior Mother    Dementia Mother    Heart attack Father 59       MI x5   Stroke Father        CVA x7   Asthma Father    Brain cancer Father    Seizures Father    Anxiety disorder Sister    OCD Sister    Sexual abuse Sister    Physical abuse Sister    Anxiety disorder Sister    ADD / ADHD Daughter    ADD / ADHD Daughter    Alcohol abuse Neg Hx    Drug abuse Neg Hx    Schizophrenia Neg Hx     Outpatient Encounter Medications as of 08/11/2022  Medication Sig   albuterol (VENTOLIN HFA) 108 (90 Base) MCG/ACT inhaler albuterol sulfate HFA 90 mcg/actuation aerosol inhaler  INHALE TWO PUFFS BY MOUTH INTO LUNGS EVERY 6 HOURS AS NEEDED   Blood Glucose Monitoring Suppl ([place]) w/Device KIT Use as directed to check blood glucose twice daily, before breakfast and before bed.   cetirizine (ZYRTEC) 10 MG tablet Take 10 mg by mouth at bedtime.   Cholecalciferol (VITAMIN D) 50 MCG (2000 UT) tablet Take 2,000 Units by mouth daily.   clonazePAM (KLONOPIN) 0.5 MG tablet Take 1 tablet (0.5 mg total) by mouth 2 (two) times daily.   cycloSPORINE (RESTASIS) 0.05 % ophthalmic emulsion Apply to eye.   estradiol (ESTRACE) 0.1 MG/GM vaginal cream Place vaginally.   FLUoxetine (PROZAC) 40 MG capsule Take 1 capsule (40 mg total) by mouth daily.   fluticasone (FLONASE) 50 MCG/ACT nasal spray Place 1 spray into both nostrils daily.   GAMUNEX-C 20 GM/200ML SOLN    Glucos-Chond-Hyal Ac-Ca Fructo (MOVE FREE JOINT HEALTH ADVANCE PO) Take by mouth.   ibuprofen (ADVIL) 800 MG tablet SMARTSIG:1 Tablet(s) By Mouth Morning-Night   insulin glargine (LANTUS SOLOSTAR) 100 UNIT/ML Solostar Pen Inject 45 Units into the skin daily.   lamoTRIgine (LAMICTAL) 25 MG tablet Take one tablet daily for 2 weeks, then increase to one twice a week   Lancets (ONETOUCH ULTRASOFT) lancets 1 each by  Other route as needed.    lidocaine (LIDODERM) 5 % lidocaine  5 % topical patch  APPLY 1 PATCH BY TOPICAL ROUTE ONCE DAILY (MAY WEAR UP TO 12HOURS.)   loratadine (CLARITIN) 10 MG tablet Take 10 mg by mouth daily.   Melatonin 5 MG CAPS    Multiple Vitamin (MULTIVITAMIN) tablet Take 1 tablet by mouth daily.   omeprazole (PRILOSEC) 20 MG capsule Take 20 mg by mouth daily.   ONETOUCH VERIO test strip 4 (four) times daily.   pregabalin (LYRICA) 200 MG capsule Take 200 mg by mouth 3 (three) times daily.   rosuvastatin (CRESTOR) 20 MG tablet TAKE ONE TABLET BY MOUTH EVERYDAY AT BEDTIME   sennosides-docusate sodium (SENOKOT-S) 8.6-50 MG tablet Take 1 tablet by mouth daily.   tiZANidine (ZANAFLEX) 4 MG tablet Take 4 mg by mouth daily. 79m at bedtime and 2 mg during the day   traMADol (ULTRAM) 50 MG tablet Take 100 mg by mouth every 8 (eight) hours as needed.   zinc gluconate 50 MG tablet Take 50 mg by mouth daily.   amitriptyline (ELAVIL) 25 MG tablet Take one or two at bedtime (Patient not taking: Reported on 08/11/2022)   Ascorbic Acid (VITAMIN C) 1000 MG tablet Take 1,000 mg by mouth daily. (Patient not taking: Reported on 08/11/2022)   betamethasone dipropionate 0.05 % cream betamethasone dipropionate 0.05 % topical cream  APPLY A thin layer TO affected area(s) ONCE DAILY AS DIRECTED (Patient not taking: Reported on 08/11/2022)   lisinopril (ZESTRIL) 5 MG tablet lisinopril 5 mg tablet  Take 1 tablet every day by oral route. (Patient not taking: Reported on 08/11/2022)   [DISCONTINUED] tirzepatide (MOUNJARO) 7.5 MG/0.5ML Pen Inject 7.5 mg into the skin once a week. (Patient not taking: Reported on 08/11/2022)   No facility-administered encounter medications on file as of 08/11/2022.    ALLERGIES: Allergies  Allergen Reactions   Molds & Smuts Shortness Of Breath    wheezing   Other Anaphylaxis, Shortness Of Breath and Other (See Comments)     : Angioedema wheezing Wheezing wheezing Other reaction(s): Cough Wheezing Itching and  rash Wheezing wheezing Wheezing wheezing Other reaction(s): Other (See Comments)  : Angioedema wheezing Wheezing wheezing Other reaction(s): Cough Wheezing Itching and rash Wheezing wheezing Other reaction(s): Other (See Comments) Wheezing wheezing  : Angioedema wheezing Wheezing wheezing Other reaction(s): Cough Wheezing Itching and rash Wheezing wheezing Wheezing wheezing   Sulfa Antibiotics Anaphylaxis     : Angioedema    Sulfonamide Derivatives Anaphylaxis     : Angioedema   Trichophyton Shortness Of Breath    wheezing wheezing wheezing wheezing wheezing wheezing wheezing wheezing wheezing   Carbamazepine Rash   Dust Mite Extract Cough and Other (See Comments)    Wheezing Other reaction(s): Wheezing Wheezing Wheezing Other reaction(s): Wheezing Wheezing Wheezing Other reaction(s): Wheezing Wheezing   Gluten Meal Itching   Clonazepam Other (See Comments)    Confusion and memory loss Caused nconfusion    VACCINATION STATUS: Immunization History  Administered Date(s) Administered   Influenza Whole 10/03/2007   Influenza-Unspecified 09/18/2014, 09/23/2015   Pneumococcal Polysaccharide-23 10/14/2012    Diabetes She presents for her follow-up diabetic visit. She has type 2 diabetes mellitus. Onset time: She was diagnosed at approximate age of 89 Her disease course has been improving. There are no hypoglycemic associated symptoms. Pertinent negatives for diabetes include no blurred vision, no fatigue, no polydipsia, no polyphagia and no polyuria. There are no hypoglycemic complications. Symptoms are stable. Diabetic complications include nephropathy and retinopathy.  Risk factors for coronary artery disease include diabetes mellitus, dyslipidemia, obesity and sedentary lifestyle. Current diabetic treatment includes insulin injections (and Mounjaro). She is compliant with treatment all of the time. Her weight is decreasing steadily. She is following a  generally healthy diet. Meal planning includes avoidance of concentrated sweets. She has not had a previous visit with a dietitian. She participates in exercise intermittently. Her home blood glucose trend is fluctuating minimally. Her breakfast blood glucose range is generally 110-130 mg/dl. Her bedtime blood glucose range is generally 110-130 mg/dl. (She presents today with her meter and logs showing at goal glycemic profile overall.  Her POCT A1c today is 5.7%, improving from last visit of 7.4%.  She did call between visits due to SE of Mounjaro and thus we stopped it.  She denies any hypoglycemia.) An ACE inhibitor/angiotensin II receptor blocker is not being taken. She does not see a podiatrist.Eye exam is current.  Hyperlipidemia This is a chronic problem. The current episode started more than 1 year ago. The problem is uncontrolled. Recent lipid tests were reviewed and are variable. Exacerbating diseases include chronic renal disease, diabetes and obesity. There are no known factors aggravating her hyperlipidemia. Current antihyperlipidemic treatment includes statins. The current treatment provides moderate improvement of lipids. There are no compliance problems.  Risk factors for coronary artery disease include diabetes mellitus, dyslipidemia, obesity and a sedentary lifestyle.    Review of systems  Constitutional: + decreasing body weight,  current Body mass index is 33.89 kg/m. , no fatigue, no subjective hyperthermia, no subjective hypothermia Eyes: no blurry vision, no xerophthalmia ENT: no sore throat, no nodules palpated in throat, no dysphagia/odynophagia, no hoarseness Cardiovascular: no chest pain, no shortness of breath, no palpitations, no leg swelling Respiratory: no cough, no shortness of breath Gastrointestinal: no nausea/vomiting/diarrhea Musculoskeletal: no muscle/joint aches Skin: no rashes, no hyperemia Neurological: no tremors, no numbness, no tingling, no  dizziness Psychiatric: no depression, no anxiety, hx bipolar d/o- controlled on meds   Objective:    BP 114/80 (BP Location: Left Arm, Patient Position: Sitting, Cuff Size: Large)   Pulse 78   Ht 5' 11"  (1.803 m)   Wt 243 lb (110.2 kg)   LMP 01/04/2018   BMI 33.89 kg/m   Wt Readings from Last 3 Encounters:  08/11/22 243 lb (110.2 kg)  05/05/22 248 lb 3.8 oz (112.6 kg)  05/04/22 249 lb (112.9 kg)    BP Readings from Last 3 Encounters:  08/11/22 114/80  05/05/22 121/69  05/04/22 122/78     Physical Exam- Limited  Constitutional:  Body mass index is 33.89 kg/m. , not in acute distress, normal state of mind Eyes:  EOMI, no exophthalmos Neck: Supple Cardiovascular: RRR, no murmurs, rubs, or gallops, no edema Respiratory: Adequate breathing efforts, no crackles, rales, rhonchi, or wheezing Musculoskeletal: no gross deformities, strength intact in all four extremities, no gross restriction of joint movements Skin:  no rashes, no hyperemia Neurological: no tremor with outstretched hands   CMP ( most recent) CMP     Component Value Date/Time   NA 139 12/23/2021 0000   K 5.0 12/23/2021 0000   CL 100 12/23/2021 0000   CO2 21 12/23/2021 0000   GLUCOSE 119 (H) 07/03/2019 1230   BUN 16 03/31/2022 0000   CREATININE 1.3 (A) 03/31/2022 0000   CREATININE 1.00 07/03/2019 1230   CREATININE 0.88 02/21/2012 1153   CALCIUM 10.4 03/31/2022 0000   PROT 7.0 07/03/2019 1230   ALBUMIN 4.4 12/23/2021 0000   AST  39 (A) 03/31/2022 0000   ALT 39 (A) 03/31/2022 0000   ALKPHOS 83 12/23/2021 0000   BILITOT 0.9 07/03/2019 1230   GFRNONAA 44 12/29/2020 0000   GFRAA 50 12/29/2020 0000     Diabetic Labs (most recent): Lab Results  Component Value Date   HGBA1C 5.7 (A) 08/11/2022   HGBA1C 7.4 03/31/2022   HGBA1C 6.1 01/04/2022   MICROALBUR 26 03/31/2022   MICROALBUR 150 10/22/2020     Lipid Panel ( most recent) Lipid Panel     Component Value Date/Time   CHOL 196 08/06/2021 0000    TRIG 199 (A) 03/31/2022 0000   HDL 54 08/06/2021 0000   CHOLHDL 4.2 03/18/2015 0915   VLDL 44 (H) 03/18/2015 0915   LDLCALC 126 03/31/2022 0000      Lab Results  Component Value Date   TSH 2.69 12/29/2020   TSH 1.66 09/14/2020   TSH 2.770 08/29/2019   TSH 3.261 10/14/2012   TSH 2.651 02/21/2012   TSH 1.981 03/06/2009   TSH 3.250 01/23/2008           Assessment & Plan:   1) Type 2 diabetes mellitus with hyperglycemia, with long-term current use of insulin (HCC)  - Michelle Clements has currently uncontrolled symptomatic type 2 DM since 56 years of age.  She presents today with her meter and logs showing at goal glycemic profile overall.  Her POCT A1c today is 5.7%, improving from last visit of 7.4%.  She did call between visits due to SE of Mounjaro and thus we stopped it.  She denies any hypoglycemia.   Recent labs reviewed, showing stable CKD stage 3.  - I had a long discussion with her about the progressive nature of diabetes and the pathology behind its complications. -her diabetes is complicated by sleep apnea, HLD, retinopathy and she remains at a high risk for more acute and chronic complications which include CAD, CVA, CKD, retinopathy, and neuropathy. These are all discussed in detail with her.  - Nutritional counseling repeated at each appointment due to patients tendency to fall back in to old habits.  - The patient admits there is a room for improvement in their diet and drink choices. -  Suggestion is made for the patient to avoid simple carbohydrates from their diet including Cakes, Sweet Desserts / Pastries, Ice Cream, Soda (diet and regular), Sweet Tea, Candies, Chips, Cookies, Sweet Pastries, Store Bought Juices, Alcohol in Excess of 1-2 drinks a day, Artificial Sweeteners, Coffee Creamer, and "Sugar-free" Products. This will help patient to have stable blood glucose profile and potentially avoid unintended weight gain.   - I encouraged the patient to switch to  unprocessed or minimally processed complex starch and increased protein intake (animal or plant source), fruits, and vegetables.   - Patient is advised to stick to a routine mealtimes to eat 3 meals a day and avoid unnecessary snacks (to snack only to correct hypoglycemia).  - she is following with Jearld Fenton, RDE for diabetes education.  - I have approached her with the following individualized plan to manage  her diabetes and patient agrees:   -She is advised to continue her Lantus 45 units SQ nightly.   -She is encouraged to continue monitoring blood glucose twice daily, before breakfast and before bed, and to call the clinic if she has readings less than 70 or greater than 200 for 3 tests in a row.  - Specific targets for  A1c;  LDL, HDL,  and Triglycerides were discussed  with the patient.  2) Blood Pressure /Hypertension:   her blood pressure is controlled to target without the use of antihypertensive medications.  She is advised to continue Lisinopril 5 mg po daily with breakfast.   3) Lipids/Hyperlipidemia:    Her most recent lipid panel from 03/31/22 shows LDL of 126 and elevated triglycerides of 199.  She is advised to continue her Crestor 20 mg po daily at bedtime.  Side effects and precautions discussed with her.  4) Weight/Diet:  Her Body mass index is 33.89 kg/m.  -  clearly complicating her diabetes care.  She has lost approx 10 lbs and is advised to keep it up!  she is a candidate for weight loss. I discussed with her the fact that loss of 5 - 10% of her  current body weight will have the most impact on her diabetes management.  Exercise, and detailed carbohydrates information provided  -  detailed on discharge instructions.  5) Vitamin D deficiency Her most recent vitamin D level was 76.6 on 03/31/22.  She is currently on OTC vitamin D3 2000 units daily.  She can take a break from supplementation at this time.  6) Chronic Care/Health Maintenance: -she is on Statin  medications and is encouraged to initiate and continue to follow up with Ophthalmology, Dentist,  Podiatrist at least yearly or according to recommendations, and advised to stay away from smoking. I have recommended yearly flu vaccine and pneumonia vaccine at least every 5 years; moderate intensity exercise for up to 150 minutes weekly; and  sleep for at least 7 hours a day.  - she is advised to maintain close follow up with [name], MD for primary care needs, as well as her other providers for optimal and coordinated care.      I spent 44 minutes in the care of the patient today including review of labs from [place], Lipids, Thyroid Function, Hematology (current and previous including abstractions from other facilities); face-to-face time discussing  her blood glucose readings/logs, discussing hypoglycemia and hyperglycemia episodes and symptoms, medications doses, her options of short and long term treatment based on the latest standards of care / guidelines;  discussion about incorporating lifestyle medicine;  and documenting the encounter. Risk reduction counseling performed per USPSTF guidelines to reduce obesity and cardiovascular risk factors.     Please refer to Patient Instructions for Blood Glucose Monitoring and Insulin/Medications Dosing Guide"  in media tab for additional information. Please  also refer to " Patient Self Inventory" in the Media  tab for reviewed elements of pertinent patient history.  Michelle Clements participated in the discussions, expressed understanding, and voiced agreement with the above plans.  All questions were answered to her satisfaction. she is encouraged to contact clinic should she have any questions or concerns prior to her return visit.   Follow up plan: - Return in about 4 months (around 12/11/2022) for Diabetes F/U with A1c in office, No previsit labs, Bring meter and logs.  Rayetta Pigg, [facility] [address] [place], [place] [address] Phone: [phone] Fax: [phone]  08/11/2022, 11:14 AM

## 2022-08-23 ENCOUNTER — Telehealth (INDEPENDENT_AMBULATORY_CARE_PROVIDER_SITE_OTHER): Payer: HMO | Admitting: Psychiatry

## 2022-08-23 ENCOUNTER — Encounter (HOSPITAL_COMMUNITY): Payer: Self-pay | Admitting: Psychiatry

## 2022-08-23 DIAGNOSIS — F3177 Bipolar disorder, in partial remission, most recent episode mixed: Secondary | ICD-10-CM

## 2022-08-23 MED ORDER — FLUOXETINE HCL 40 MG PO CAPS: 40.0000 mg | ORAL_CAPSULE | Freq: Every day | ORAL | 2 refills | Status: DC

## 2022-08-23 MED ORDER — CLONAZEPAM 0.5 MG PO TABS: 0.5000 mg | ORAL_TABLET | Freq: Two times a day (BID) | ORAL | 2 refills | Status: DC

## 2022-08-23 MED ORDER — LAMOTRIGINE 25 MG PO TABS: ORAL_TABLET | ORAL | 2 refills | Status: DC

## 2022-08-23 NOTE — Telephone Encounter (Signed)
Called patient and LMOM

## 2022-08-23 NOTE — Progress Notes (Signed)
Virtual Visit via Video Note  I connected with Michelle Clements on 08/23/22 at  9:40 AM EDT by a video enabled telemedicine application and verified that I am speaking with the correct person using two identifiers.  Location: Patient: home Provider: office   I discussed the limitations of evaluation and management by telemedicine and the availability of in person appointments. The patient expressed understanding and agreed to proceed.      I discussed the assessment and treatment plan with the patient. The patient was provided an opportunity to ask questions and all were answered. The patient agreed with the plan and demonstrated an understanding of the instructions.   The patient was advised to call back or seek an in-person evaluation if the symptoms worsen or if the condition fails to improve as anticipated.  I provided 15 minutes of non-face-to-face time during this encounter.   [name], MD  [facility] MD/PA/NP OP Progress Note  08/23/2022 10:01 AM Michelle Clements  MRN:  [id number]  Chief Complaint:  Chief Complaint  Patient presents with   Anxiety   Depression   Follow-up   HPI: This patient is a 56year-old married white female who lives with her husband in [place]. She has two daughters  who live out of the home. 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  She states that overall her mood has been good and the Lamictal has really helped with the mood swings and irritability.  She is now on Belbuca for pain management and its making her very drowsy.  She stopped the amitriptyline.  She is only been on the [facility] for about 10 days and it is been hard for her to get used to taking it twice a day as it makes her drowsy during the day as well.  She has no longer on any other pain medications.  She does not think it is helping all that much and causing too many side effects but I encouraged her to give it more time.  She denies any  thoughts of self-harm or suicide.  She is enjoying her time with her family and is trying to stay active. Visit Diagnosis:    ICD-10-CM   1. Bipolar disorder, in partial remission, most recent episode mixed ([place])  F31.77       Past Psychiatric History: Admission earlier this year for overdose/suicide attempt followed by partial hospital program.  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[place])    Depression    Essential hypertension, benign    Fibromyalgia    GERD (gastroesophageal reflux disease)    H/O sleep apnea    Hip pain, left    History of palpitations    Negative Holter monitor   History of pneumonia 1988   Hyperlipidemia    IBS (irritable bowel syndrome)    Neck pain    Poor short term memory    PTSD (post-traumatic stress disorder)    S/P Botox injection 11/2014    For migraine headaches   Type 2 diabetes mellitus ([place])    Urgency of urination  Past Surgical History:  Procedure Laterality Date   CARPAL TUNNEL RELEASE Right 06/10/2013   Procedure: CARPAL TUNNEL RELEASE;  Surgeon: Faythe Ghee, MD;  Location: [place] [facility];  Service: Neurosurgery;  Laterality: Right;  Right Carpal Tunnel Release    CHOLECYSTECTOMY     DENTAL SURGERY  12/2014   mole exc     x 3   MUSCLE BIOPSY  2008   Right leg   TUBAL LIGATION      Family Psychiatric History: See below  Family History:  Family History  Problem Relation Age of Onset   Fibromyalgia Mother    Diabetes type II Mother    Depression Mother    Alzheimer's disease Mother    GER disease Mother    Heart disease Mother    Paranoid behavior Mother    Dementia Mother    Heart attack Father 30       MI x5   Stroke Father        CVA x7    Asthma Father    Brain cancer Father    Seizures Father    Anxiety disorder Sister    OCD Sister    Sexual abuse Sister    Physical abuse Sister    Anxiety disorder Sister    ADD / ADHD Daughter    ADD / ADHD Daughter    Alcohol abuse Neg Hx    Drug abuse Neg Hx    Schizophrenia Neg Hx     Social History:  Social History   Socioeconomic History   Marital status: Married    Spouse name: Not on file   Number of children: 2   Years of education: College   Highest education level: Associate degree: academic program  Occupational History   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   Caffeine use: 2 cups coffee in morning and 2 cups tea during the day    Social Determinants of Health   Financial Resource Strain: Low Risk  (07/27/2021)   Overall Financial Resource Strain (CARDIA)    Difficulty of Paying Living Expenses: Not very hard  Food Insecurity: No Food Insecurity (07/27/2021)   Hunger Vital Sign    Worried About Running Out of Food in the Last Year: Never true    Ran Out of Food in the Last Year: Never true  Transportation Needs: No Transportation Needs (07/27/2021)   PRAPARE - Hydrologist (Medical): No    Lack of Transportation (Non-Medical): No  Physical Activity: Inactive (07/27/2021)   Exercise Vital Sign    Days of Exercise per Week: 0 days    Minutes of Exercise per Session: 0 min  Stress: No Stress  Concern Present (07/27/2021)   [place]    Feeling of Stress : Not at  all  Social Connections: Moderately Integrated (07/27/2021)   Social Connection and Isolation Panel [NHANES]    Frequency of Communication with Friends and Family: More than three times a week    Frequency of Social Gatherings with Friends and Family: More than three times a week    Attends Religious Services: More than 4 times per year    Active Member of [employer] or Organizations: No    Attends Archivist Meetings: Never    Marital Status: Married    Allergies:  Allergies  Allergen Reactions   Molds & Smuts Shortness Of Breath    wheezing   Other Anaphylaxis, Shortness Of Breath and Other (See Comments)     : Angioedema wheezing Wheezing wheezing Other reaction(s): Cough Wheezing Itching and rash Wheezing wheezing Wheezing wheezing Other reaction(s): Other (See Comments)  : Angioedema wheezing Wheezing wheezing Other reaction(s): Cough Wheezing Itching and rash Wheezing wheezing Other reaction(s): Other (See Comments) Wheezing wheezing  : Angioedema wheezing Wheezing wheezing Other reaction(s): Cough Wheezing Itching and rash Wheezing wheezing Wheezing wheezing   Sulfa Antibiotics Anaphylaxis     : Angioedema    Sulfonamide Derivatives Anaphylaxis     : Angioedema   Trichophyton Shortness Of Breath    wheezing wheezing wheezing wheezing wheezing wheezing wheezing wheezing wheezing   Carbamazepine Rash   Dust Mite Extract Cough and Other (See Comments)    Wheezing Other reaction(s): Wheezing Wheezing Wheezing Other reaction(s): Wheezing Wheezing Wheezing Other reaction(s): Wheezing Wheezing   Gluten Meal Itching   Clonazepam Other (See Comments)    Confusion and memory loss Caused nconfusion    Metabolic Disorder Labs: Lab Results  Component Value Date   HGBA1C 5.7 (A) 08/11/2022   MPG  131 03/18/2015   No results found for: "PROLACTIN" Lab Results  Component Value Date   CHOL 196 08/06/2021   TRIG 199 (A) 03/31/2022   HDL 54 08/06/2021   CHOLHDL 4.2 03/18/2015   VLDL 44 (H) 03/18/2015   LDLCALC 126 03/31/2022   LDLCALC 117 08/06/2021   Lab Results  Component Value Date   TSH 2.69 12/29/2020   TSH 1.66 09/14/2020    Therapeutic Level Labs: Lab Results  Component Value Date   LITHIUM 0.20 (L) 02/21/2012   No results found for: "VALPROATE" No results found for: "CBMZ"  Current Medications: Current Outpatient Medications  Medication Sig Dispense Refill   albuterol (VENTOLIN HFA) 108 (90 Base) MCG/ACT inhaler albuterol sulfate HFA 90 mcg/actuation aerosol inhaler  INHALE TWO PUFFS BY MOUTH INTO LUNGS EVERY 6 HOURS AS NEEDED     BELBUCA 150 MCG FILM SMARTSIG:1 Strip(s) buccal Every 12 Hours     Blood Glucose Monitoring Suppl (ONETOUCH VERIO FLEX SYSTEM) w/Device KIT Use as directed to check blood glucose twice daily, before breakfast and before bed. 1 kit 0   cetirizine (ZYRTEC) 10 MG tablet Take 10 mg by mouth at bedtime.     Cholecalciferol (VITAMIN D) 50 MCG (2000 UT) tablet Take 2,000 Units by mouth daily.     clonazePAM (KLONOPIN) 0.5 MG tablet Take 1 tablet (0.5 mg total) by mouth 2 (two) times daily. 60 tablet 2   cycloSPORINE (RESTASIS) 0.05 % ophthalmic emulsion Apply to eye.     estradiol (ESTRACE) 0.1 MG/GM vaginal cream Place vaginally.     FLUoxetine (PROZAC) 40 MG capsule Take 1 capsule (40 mg total) by mouth daily. 30 capsule 2   fluticasone (FLONASE) 50 MCG/ACT nasal spray Place 1 spray into both nostrils daily.  GAMUNEX-C 20 GM/200ML SOLN      Glucos-Chond-Hyal Ac-Ca Fructo (MOVE FREE JOINT HEALTH ADVANCE PO) Take by mouth.     insulin glargine (LANTUS SOLOSTAR) 100 UNIT/ML Solostar Pen Inject 45 Units into the skin daily. 45 mL 3   lamoTRIgine (LAMICTAL) 25 MG tablet Take one tablet daily for 2 weeks, then increase to one twice a week 60  tablet 2   Lancets (ONETOUCH ULTRASOFT) lancets 1 each by Other route as needed.      lidocaine (LIDODERM) 5 % lidocaine 5 % topical patch  APPLY 1 PATCH BY TOPICAL ROUTE ONCE DAILY (MAY WEAR UP TO 12HOURS.)     lisinopril (ZESTRIL) 5 MG tablet lisinopril 5 mg tablet  Take 1 tablet every day by oral route. (Patient not taking: Reported on 08/11/2022)     loratadine (CLARITIN) 10 MG tablet Take 10 mg by mouth daily.     Melatonin 5 MG CAPS      Multiple Vitamin (MULTIVITAMIN) tablet Take 1 tablet by mouth daily.     omeprazole (PRILOSEC) 20 MG capsule Take 20 mg by mouth daily.     ONETOUCH VERIO test strip 4 (four) times daily.     pregabalin (LYRICA) 200 MG capsule Take 200 mg by mouth 3 (three) times daily.     rosuvastatin (CRESTOR) 20 MG tablet TAKE ONE TABLET BY MOUTH EVERYDAY AT BEDTIME 90 tablet 0   sennosides-docusate sodium (SENOKOT-S) 8.6-50 MG tablet Take 1 tablet by mouth daily.     tiZANidine (ZANAFLEX) 4 MG tablet Take 4 mg by mouth daily. 94m at bedtime and 2 mg during the day     zinc gluconate 50 MG tablet Take 50 mg by mouth daily.     No current facility-administered medications for this visit.     Musculoskeletal: Strength & Muscle Tone: within normal limits Gait & Station: normal Patient leans: N/A  Psychiatric Specialty Exam: Review of Systems  Constitutional:  Positive for fatigue.  Musculoskeletal:  Positive for arthralgias, back pain, myalgias and neck pain.  All other systems reviewed and are negative.   Last menstrual period 01/04/2018.There is no height or weight on file to calculate BMI.  General Appearance: Casual and Fairly Groomed  Eye Contact:  Good  Speech:  Clear and Coherent  Volume:  Normal  Mood:  Euthymic  Affect:  Congruent  Thought Process:  Goal Directed  Orientation:  Full (Time, Place, and Person)  Thought Content: WDL   Suicidal Thoughts:  No  Homicidal Thoughts:  No  Memory:  Immediate;   Good Recent;   Good Remote;   Fair   Judgement:  Good  Insight:  Good  Psychomotor Activity:  Decreased  Concentration:  Concentration: Fair and Attention Span: Fair  Recall:  Good  Fund of Knowledge: Good  Language: Good  Akathisia:  No  Handed:  Right  AIMS (if indicated): not done  Assets:  Communication Skills Desire for Improvement Resilience Social Support Talents/Skills  ADL's:  Intact  Cognition: WNL  Sleep:  Good   Screenings: Mini-Mental    Flowsheet Row [facility]  Total Score (max 30 points ) 27      PHQ2-9    Flowsheet Row [facility] [facility] [facility]  PHQ-2 Total Score 0 0 2 0 0  PHQ-9 Total Score -- -- 7 0 4      Flowsheet Row Video Visit from 08/23/2022 in [place] Video Visit from 06/20/2022 in [place] [facility] Video Visit from 03/16/2022 in [place] [facility]  C-SSRS RISK CATEGORY No Risk No Risk Error: Q3, 4, or 5 should not be populated when Q2 is No        Assessment and Plan: This patient is a 56 year old female with a history of PTSD depression anxiety and hallucinations.  She probably qualifies for bipolar 2 disorder.  She is doing much better with the Lamictal 25 mg twice daily for mood swings so this will be continued.  She will also continue clonazepam 0.5 mg twice daily for anxiety and Prozac 40 mg daily for depression.  She was reminded not to take the clonazepam with the [place].  She will return to see me in 2  months  Collaboration of Care: Collaboration of Care: Primary Care Provider AEB notes will be shared with PCP at patient's request  Patient/Guardian was advised Release of Information must be obtained prior to any record release in order to collaborate their care with an outside provider. Patient/Guardian was advised if they have not already done so to contact the registration department to sign all necessary forms in order for [place] to release information regarding their care.   Consent: Patient/Guardian gives verbal consent for treatment and assignment of benefits for services provided during this visit. Patient/Guardian expressed understanding and agreed to proceed.    [name], MD 08/23/2022, 10:01 AM

## 2022-08-24 DIAGNOSIS — E119 Type 2 diabetes mellitus without complications: Secondary | ICD-10-CM | POA: Diagnosis not present

## 2022-08-24 DIAGNOSIS — Z79899 Other long term (current) drug therapy: Secondary | ICD-10-CM | POA: Diagnosis not present

## 2022-08-24 DIAGNOSIS — M549 Dorsalgia, unspecified: Secondary | ICD-10-CM | POA: Diagnosis not present

## 2022-08-24 DIAGNOSIS — F419 Anxiety disorder, unspecified: Secondary | ICD-10-CM | POA: Diagnosis not present

## 2022-08-24 DIAGNOSIS — Z0130 Encounter for examination of blood pressure without abnormal findings: Secondary | ICD-10-CM | POA: Diagnosis not present

## 2022-08-24 DIAGNOSIS — G8929 Other chronic pain: Secondary | ICD-10-CM | POA: Diagnosis not present

## 2022-08-24 DIAGNOSIS — Z6834 Body mass index (BMI) 34.0-34.9, adult: Secondary | ICD-10-CM | POA: Diagnosis not present

## 2022-08-24 DIAGNOSIS — M129 Arthropathy, unspecified: Secondary | ICD-10-CM | POA: Diagnosis not present

## 2022-08-24 DIAGNOSIS — E7800 Pure hypercholesterolemia, unspecified: Secondary | ICD-10-CM | POA: Diagnosis not present

## 2022-08-24 DIAGNOSIS — M19011 Primary osteoarthritis, right shoulder: Secondary | ICD-10-CM | POA: Diagnosis not present

## 2022-08-24 DIAGNOSIS — F32A Depression, unspecified: Secondary | ICD-10-CM | POA: Diagnosis not present

## 2022-08-24 DIAGNOSIS — M539 Dorsopathy, unspecified: Secondary | ICD-10-CM | POA: Diagnosis not present

## 2022-08-24 DIAGNOSIS — F319 Bipolar disorder, unspecified: Secondary | ICD-10-CM | POA: Diagnosis not present

## 2022-08-24 DIAGNOSIS — M797 Fibromyalgia: Secondary | ICD-10-CM | POA: Diagnosis not present

## 2022-08-26 DIAGNOSIS — S338XXA Sprain of other parts of lumbar spine and pelvis, initial encounter: Secondary | ICD-10-CM | POA: Diagnosis not present

## 2022-08-26 DIAGNOSIS — S134XXA Sprain of ligaments of cervical spine, initial encounter: Secondary | ICD-10-CM | POA: Diagnosis not present

## 2022-08-26 DIAGNOSIS — S233XXA Sprain of ligaments of thoracic spine, initial encounter: Secondary | ICD-10-CM | POA: Diagnosis not present

## 2022-08-27 NOTE — Progress Notes (Unsigned)
[facility] [address], [place] [address]   CLINIC:  Medical Oncology/Hematology  PCP:  [name], MD [place] [address] [phone]   REASON FOR VISIT:  Follow-up for hypogammaglobulinemia   CURRENT THERAPY: IVIG  INTERVAL HISTORY:  Ms. Plaia 56 y.o. 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  She was last seen by Tarri Abernethy PA-C on 05/05/2022.    At today's visit, she reports feeling fair.   ***  She has not had any infections requiring antibiotics in the last 3 months. *** She has mild chest tightness and wheezing from asthma, but this is improved from her last visit.*** She continues to have sinus congestion and drainage, has been followed by ENT.  She reports that she started taking guaifenesin, which is helped with some of her sinus issues.*** She denies any skin issues or rashes.***  She denies any dysuria or lower urinary tract symptoms. *** She has previously had intermittent diarrhea from her IBS, but this is currently improved. *** No B symptoms such as fever, chills, night sweats, unintentional weight loss.  *** She reports that her generalized body pain and fibromyalgia pains are slightly improved after being back on IVIG. ***  She continues to have frequent headaches, which began around September 2022 (prior to her first IVIG infusion in November 2022).  ***There is no temporal association between her headaches and the timing of her IVIG.  She denies any symptoms of meningeal irritation or altered mental status.  No associated fever, chills, muscle spasms, or body aches. ***   She is tolerating her IVIG infusions well.  She make sure that she hydrates to the point that she has clear urine.  She is currently receiving premedication with Tylenol and Benadryl.  She feels "tired" on the day of her infusion, but "bounces right back" the next day. ***  She has ***% energy and ***% appetite. She  endorses that she is maintaining a stable weight.   REVIEW OF SYSTEMS:   *** Review of Systems  Constitutional:  Positive for fatigue. Negative for appetite change, chills, diaphoresis, fever and unexpected weight change.  HENT:   Negative for lump/mass and nosebleeds.   Eyes:  Negative for eye problems.  Respiratory:  Positive for chest tightness and wheezing (asthma). Negative for cough, hemoptysis and shortness of breath.   Cardiovascular:  Negative for chest pain, leg swelling and palpitations.  Gastrointestinal:  Negative for abdominal pain, blood in stool, constipation, diarrhea, nausea and vomiting.  Genitourinary:  Negative for hematuria.   Musculoskeletal:  Positive for arthralgias, myalgias and neck pain.  Skin: Negative.   Neurological:  Positive for headaches (daily). Negative for dizziness and light-headedness.  Hematological:  Does not bruise/bleed easily.  Psychiatric/Behavioral:  Positive for sleep disturbance.       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[place])    Depression    Essential hypertension, benign    Fibromyalgia    GERD (gastroesophageal reflux disease)    H/O sleep apnea    Hip pain, left    History of palpitations    Negative Holter monitor   History of pneumonia 1988   Hyperlipidemia  IBS (irritable bowel syndrome)    Neck pain    Poor short term memory    PTSD (post-traumatic stress disorder)    S/P Botox injection 11/2014    For migraine headaches   Type 2 diabetes mellitus ([place])    Urgency of urination    Past Surgical History:  Procedure Laterality Date   CARPAL TUNNEL RELEASE Right 06/10/2013   Procedure: CARPAL TUNNEL RELEASE;  Surgeon: Faythe Ghee, MD;  Location: [facility];  Service:  Neurosurgery;  Laterality: Right;  Right Carpal Tunnel Release    CHOLECYSTECTOMY     DENTAL SURGERY  12/2014   mole exc     x 3   MUSCLE BIOPSY  2008   Right leg   TUBAL LIGATION       SOCIAL HISTORY:  Social History   Socioeconomic History   Marital status: Married    Spouse name: Not on file   Number of children: 2   Years of education: College   Highest education level: Associate degree: academic program  Occupational History   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   Caffeine use: 2 cups coffee in morning and 2 cups tea during the day    Social Determinants of Health   Financial Resource Strain: Low Risk  (07/27/2021)   Overall Financial Resource Strain (CARDIA)    Difficulty of Paying Living Expenses: Not very hard  Food Insecurity: No Food Insecurity (07/27/2021)   Hunger Vital Sign    Worried About Running Out of Food in the Last Year: Never true    Ran Out of Food in the Last Year: Never true  Transportation Needs: No Transportation Needs (07/27/2021)   PRAPARE - Hydrologist (Medical): No    Lack of Transportation (Non-Medical): No  Physical Activity: Inactive (07/27/2021)   Exercise Vital Sign    Days of Exercise per Week: 0 days    Minutes of Exercise per Session: 0 min  Stress: No Stress Concern Present (07/27/2021)   [place]     Feeling of Stress : Not at all  Social Connections: Moderately Integrated (07/27/2021)   Social Connection and Isolation Panel [NHANES]    Frequency of Communication with Friends and Family: More than three times a week    Frequency of Social Gatherings with Friends and Family: More than three times a week    Attends Religious Services: More than 4 times per year    Active Member of [employer] or Organizations: No    Attends Archivist Meetings: Never    Marital Status: Married  Human resources officer Violence: Not At Risk (07/27/2021)   Humiliation, Afraid, Rape, and Kick questionnaire    Fear of Current or Ex-Partner: No    Emotionally Abused: No  Physically Abused: No    Sexually Abused: No    FAMILY HISTORY:  Family History  Problem Relation Age of Onset   Fibromyalgia Mother    Diabetes type II Mother    Depression Mother    Alzheimer's disease Mother    GER disease Mother    Heart disease Mother    Paranoid behavior Mother    Dementia Mother    Heart attack Father 75       MI x5   Stroke Father        CVA x7   Asthma Father    Brain cancer Father    Seizures Father    Anxiety disorder Sister    OCD Sister    Sexual abuse Sister    Physical abuse Sister    Anxiety disorder Sister    ADD / ADHD Daughter    ADD / ADHD Daughter    Alcohol abuse Neg Hx    Drug abuse Neg Hx    Schizophrenia Neg Hx     CURRENT MEDICATIONS:  Outpatient Encounter Medications as of 08/29/2022  Medication Sig Note   albuterol (VENTOLIN HFA) 108 (90 Base) MCG/ACT inhaler albuterol sulfate HFA 90 mcg/actuation aerosol inhaler  INHALE TWO PUFFS BY MOUTH INTO LUNGS EVERY 6 HOURS AS NEEDED    BELBUCA 150 MCG FILM SMARTSIG:1 Strip(s) buccal Every 12 Hours    Blood Glucose Monitoring Suppl ([place]) w/Device KIT Use as directed to check blood glucose twice daily, before breakfast and before bed.    cetirizine (ZYRTEC) 10 MG tablet Take 10 mg by mouth at bedtime.     Cholecalciferol (VITAMIN D) 50 MCG (2000 UT) tablet Take 2,000 Units by mouth daily.    clonazePAM (KLONOPIN) 0.5 MG tablet Take 1 tablet (0.5 mg total) by mouth 2 (two) times daily.    cycloSPORINE (RESTASIS) 0.05 % ophthalmic emulsion Apply to eye.    estradiol (ESTRACE) 0.1 MG/GM vaginal cream Place vaginally.    FLUoxetine (PROZAC) 40 MG capsule Take 1 capsule (40 mg total) by mouth daily.    fluticasone (FLONASE) 50 MCG/ACT nasal spray Place 1 spray into both nostrils daily.    GAMUNEX-C 20 GM/200ML SOLN     Glucos-Chond-Hyal Ac-Ca Fructo (MOVE FREE JOINT HEALTH ADVANCE PO) Take by mouth.    insulin glargine (LANTUS SOLOSTAR) 100 UNIT/ML Solostar Pen Inject 45 Units into the skin daily.    lamoTRIgine (LAMICTAL) 25 MG tablet Take one tablet daily for 2 weeks, then increase to one twice a week    Lancets (ONETOUCH ULTRASOFT) lancets 1 each by Other route as needed.  04/25/2014: Received from: External Pharmacy   lidocaine (LIDODERM) 5 % lidocaine 5 % topical patch  APPLY 1 PATCH BY TOPICAL ROUTE ONCE DAILY (MAY WEAR UP TO 12HOURS.)    lisinopril (ZESTRIL) 5 MG tablet lisinopril 5 mg tablet  Take 1 tablet every day by oral route. (Patient not taking: Reported on 08/11/2022)    loratadine (CLARITIN) 10 MG tablet Take 10 mg by mouth daily.    Melatonin 5 MG CAPS     Multiple Vitamin (MULTIVITAMIN) tablet Take 1 tablet by mouth daily.    omeprazole (PRILOSEC) 20 MG capsule Take 20 mg by mouth daily.    ONETOUCH VERIO test strip 4 (four) times daily.    pregabalin (LYRICA) 200 MG capsule Take 200 mg by mouth 3 (three) times daily.    rosuvastatin (CRESTOR) 20 MG tablet TAKE ONE TABLET BY MOUTH EVERYDAY AT BEDTIME  sennosides-docusate sodium (SENOKOT-S) 8.6-50 MG tablet Take 1 tablet by mouth daily.    tiZANidine (ZANAFLEX) 4 MG tablet Take 4 mg by mouth daily. 12m at bedtime and 2 mg during the day 03/12/2015: Received from: External Pharmacy Received Sig:    zinc gluconate 50 MG tablet Take 50  mg by mouth daily.    No facility-administered encounter medications on file as of 08/29/2022.    ALLERGIES:  Allergies  Allergen Reactions   Molds & Smuts Shortness Of Breath    wheezing   Other Anaphylaxis, Shortness Of Breath and Other (See Comments)     : Angioedema wheezing Wheezing wheezing Other reaction(s): Cough Wheezing Itching and rash Wheezing wheezing Wheezing wheezing Other reaction(s): Other (See Comments)  : Angioedema wheezing Wheezing wheezing Other reaction(s): Cough Wheezing Itching and rash Wheezing wheezing Other reaction(s): Other (See Comments) Wheezing wheezing  : Angioedema wheezing Wheezing wheezing Other reaction(s): Cough Wheezing Itching and rash Wheezing wheezing Wheezing wheezing   Sulfa Antibiotics Anaphylaxis     : Angioedema    Sulfonamide Derivatives Anaphylaxis     : Angioedema   Trichophyton Shortness Of Breath    wheezing wheezing wheezing wheezing wheezing wheezing wheezing wheezing wheezing   Carbamazepine Rash   Dust Mite Extract Cough and Other (See Comments)    Wheezing Other reaction(s): Wheezing Wheezing Wheezing Other reaction(s): Wheezing Wheezing Wheezing Other reaction(s): Wheezing Wheezing   Gluten Meal Itching   Clonazepam Other (See Comments)    Confusion and memory loss Caused nconfusion     PHYSICAL EXAM: *** ECOG PERFORMANCE STATUS: 1 - Symptomatic but completely ambulatory    There were no vitals filed for this visit. There were no vitals filed for this visit. Physical Exam Constitutional:      Appearance: Normal appearance. She is obese.  HENT:     Head: Normocephalic and atraumatic.     Mouth/Throat:     Mouth: Mucous membranes are moist.  Eyes:     Extraocular Movements: Extraocular movements intact.     Pupils: Pupils are equal, round, and reactive to light.  Cardiovascular:     Rate and Rhythm: Normal rate and regular rhythm.     Pulses: Normal pulses.      Heart sounds: Normal heart sounds.  Pulmonary:     Effort: Pulmonary effort is normal.     Breath sounds: No wheezing.  Abdominal:     General: Bowel sounds are normal.     Palpations: Abdomen is soft.     Tenderness: There is no abdominal tenderness.  Musculoskeletal:        General: No swelling.     Right lower leg: No edema.     Left lower leg: No edema.  Lymphadenopathy:     Cervical: No cervical adenopathy.  Skin:    General: Skin is warm and dry.  Neurological:     General: No focal deficit present.     Mental Status: She is alert and oriented to person, place, and time.  Psychiatric:        Mood and Affect: Mood normal.        Behavior: Behavior normal.      LABORATORY DATA:  I have reviewed the labs as listed.  CBC    Component Value Date/Time   WBC 9.0 07/03/2019 1230   RBC 4.64 07/03/2019 1230   HGB 14.7 07/03/2019 1230   HCT 42.8 07/03/2019 1230   PLT 288 07/03/2019 1230   MCV 92.2 07/03/2019 1230   MCH 31.7 07/03/2019  1230   MCHC 34.3 07/03/2019 1230   RDW 12.4 07/03/2019 1230   LYMPHSABS 2.2 07/03/2019 1230   MONOABS 0.5 07/03/2019 1230   EOSABS 0.3 07/03/2019 1230   BASOSABS 0.1 07/03/2019 1230      Latest Ref Rng & Units 03/31/2022   12:00 AM 12/23/2021   12:00 AM 08/06/2021   12:00 AM  CMP  BUN 4 - 21 16     20     16       Creatinine 0.5 - 1.1 1.3     1.2     1.4      Sodium 137 - 147  139     143      Potassium 3.4 - 5.3  5.0     5.0      Chloride 99 - 108  100     102      CO2 13 - 22  21     26       Calcium 8.7 - 10.7 10.4     10.1     10.8      Alkaline Phos 25 - 125  83     78      AST 13 - 35 39     43     28      ALT 7 - 35 U/L 39     39     28         This result is from an external source.     DIAGNOSTIC IMAGING:  I have independently reviewed the relevant imaging and discussed with the patient.  ASSESSMENT & PLAN: 1.  Hypogammaglobulinemia with recurrent infections - Labs from October 2015 showed IgG 566 [phone]), IgM 28  (57-237), IgA 65, IgE 12 - Evaluated by [facility] by Dr. Lloyd Huger (10/27/2014), who noted: " A review of Ms. Woodring immune evaluation reveals mild hypogammaglobulinemia (not significant), a low IgM but normal specific antibody titers, normal B cell subsets panels and normal cellular function. Taken together the immune evaluation is normal. I do not believe Ms. Egolf has a functional disorder of her immune system. For the hypo IgM this may become significant if the IgM was less that 15. For this I suggest she have an annual IgG, IgM and IgA. Currently the IgM level is not likely to be significant". 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 Lorenso Courier, with first dose on 11/03/2021 - She reports that she has been tolerating her IVIG infusions well, no reported adverse events or systemic reactions.    *** - Over the last 3 months, she has has not had any infections requiring antibiotics*** - No B symptoms such as fever, chills, night sweats, unintentional weight loss  *** - Most recent quantitative immunoglobulins (***) show IgA ***, IgG ***, and IgM ***.  CBC within normal limits.  ***CMP ordered, but not checked by home health.*** - Patient receives her home IVIG infusions via [facility] (contact Mali Sandy at [phone], fax [phone], email [email or web address]) - PLAN: Continue IVIG q. 28 days (via [facility] home [facility] infusion) with every 32-monthclinic visit follow-up.*** - We will check quantitative immunoglobulins 1 to 2 days before each IVIG treatment.  Therapeutic goal is IgG around 600. *** - Check CBC and CMP with home health lab draw in 3 months. ***  2.  Headaches   - Headaches described in HPI above are unlikely to be caused by IVIG, since they began prior to when she started on  IVIG and did not show any temporal relationship to her IVIG infusions. - PLAN: Although her headache pattern seems unlikely to be caused by IVIG, recommend to continue slower rate of IVIG infusion and prehydration with 500 mL NS. *** - She has also been referred to neurology for work-up of her headaches  3.  Other history - Her past medical history is otherwise notable for fatty liver disease with history of transaminitis, GERD, fibromyalgia, type 2 diabetes mellitus, and IBS   PLAN SUMMARY & DISPOSITION: *** Monthly IVIG infusions via home health Monthly lab check (quantitative immunoglobulins) via home health Check CBC and CMP in 3 months (home health lab draw) Office visit in 3 months  All questions were answered. The patient knows to call the clinic with any problems, questions or concerns.  Medical decision making: Moderate  ***  Time spent on visit: I spent *** minutes counseling the patient face to face. The total time spent in the appointment was *** minutes and more than 50% was on counseling.   Harriett Rush, PA-C  ***

## 2022-08-28 ENCOUNTER — Encounter: Payer: Self-pay | Admitting: Nurse Practitioner

## 2022-08-28 DIAGNOSIS — M25552 Pain in left hip: Secondary | ICD-10-CM | POA: Diagnosis not present

## 2022-08-28 DIAGNOSIS — Z6834 Body mass index (BMI) 34.0-34.9, adult: Secondary | ICD-10-CM | POA: Diagnosis not present

## 2022-08-28 DIAGNOSIS — F32A Depression, unspecified: Secondary | ICD-10-CM | POA: Diagnosis not present

## 2022-08-28 DIAGNOSIS — Z79899 Other long term (current) drug therapy: Secondary | ICD-10-CM | POA: Diagnosis not present

## 2022-08-28 DIAGNOSIS — E7800 Pure hypercholesterolemia, unspecified: Secondary | ICD-10-CM | POA: Diagnosis not present

## 2022-08-28 DIAGNOSIS — Z0130 Encounter for examination of blood pressure without abnormal findings: Secondary | ICD-10-CM | POA: Diagnosis not present

## 2022-08-28 DIAGNOSIS — G8929 Other chronic pain: Secondary | ICD-10-CM | POA: Diagnosis not present

## 2022-08-28 DIAGNOSIS — E119 Type 2 diabetes mellitus without complications: Secondary | ICD-10-CM | POA: Diagnosis not present

## 2022-08-28 DIAGNOSIS — M25551 Pain in right hip: Secondary | ICD-10-CM | POA: Diagnosis not present

## 2022-08-28 DIAGNOSIS — M797 Fibromyalgia: Secondary | ICD-10-CM | POA: Diagnosis not present

## 2022-08-28 DIAGNOSIS — M549 Dorsalgia, unspecified: Secondary | ICD-10-CM | POA: Diagnosis not present

## 2022-08-28 DIAGNOSIS — F419 Anxiety disorder, unspecified: Secondary | ICD-10-CM | POA: Diagnosis not present

## 2022-08-28 DIAGNOSIS — M19011 Primary osteoarthritis, right shoulder: Secondary | ICD-10-CM | POA: Diagnosis not present

## 2022-08-29 ENCOUNTER — Inpatient Hospital Stay: Payer: HMO | Admitting: Physician Assistant

## 2022-08-29 VITALS — BP 142/81 | HR 79 | Temp 97.1°F | Resp 18 | Ht 71.0 in | Wt 242.8 lb

## 2022-08-29 DIAGNOSIS — N390 Urinary tract infection, site not specified: Secondary | ICD-10-CM | POA: Diagnosis not present

## 2022-08-29 DIAGNOSIS — D801 Nonfamilial hypogammaglobulinemia: Secondary | ICD-10-CM | POA: Diagnosis not present

## 2022-08-29 DIAGNOSIS — D839 Common variable immunodeficiency, unspecified: Secondary | ICD-10-CM | POA: Diagnosis not present

## 2022-08-29 DIAGNOSIS — J328 Other chronic sinusitis: Secondary | ICD-10-CM | POA: Diagnosis not present

## 2022-08-29 NOTE — Patient Instructions (Signed)
[facility] Discharge Instructions  You were seen today by Tarri Abernethy PA-C for your immune deficiency (hypogammaglobulinemia) and recurrent infections.  - We will continue IVIG infusions once per month via your home health agency.    - Your home health agency should be checking your immunoglobulin levels before EACH of your IVIG infusions.  - At your lab check in 3 months, home health also needs to check your CBC and CMP.  If these levels are not checked by home health, we will have them added to your next appointment.  - Your headaches do not fit the typical pattern of IVIG-induced headaches.  They may be due to stress, sinus problems, or some other condition.  You have already been referred to see neurology.  I suggest that you follow-up with them regarding the cause of your headaches.  - Speak with your primary care provider regarding your other chronic conditions, especially your asthma and chest tightness.  MEDICATIONS: Continue IVIG every 4 weeks  FOLLOW-UP APPOINTMENT: Office visit in 3 months  ** Thank you for trusting me with your healthcare!  I strive to provide all of my patients with quality care at each visit.  If you receive a survey for this visit, I would be so grateful to you for taking the time to provide feedback.  Thank you in advance!  ~ Abbigal Radich                   Dr. Derek Jack   &   Tarri Abernethy, PA-C   - - - - - - - - - - - - - - - - - -     Thank you for choosing [facility] at [facility] to provide your oncology and hematology care.  To afford each patient quality time with our provider, please arrive at least 15 minutes before your scheduled appointment time.   If you have a lab appointment with the [facility] please come in thru the Main Entrance and check in at the main information desk.  You need to re-schedule your appointment should you arrive 10 or more minutes late.  We  strive to give you quality time with our providers, and arriving late affects you and other patients whose appointments are after yours.  Also, if you no show three or more times for appointments you may be dismissed from the clinic at the providers discretion.     Again, thank you for choosing [facility].  Our hope is that these requests will decrease the amount of time that you wait before being seen by our physicians.       _____________________________________________________________  Should you have questions after your visit to [facility], please contact our office at [phone] and follow the prompts.  Our office hours are 8:00 a.m. and 4:30 p.m. Monday - Friday.  Please note that voicemails left after 4:00 p.m. may not be returned until the following business day.  We are closed weekends and major holidays.  You do have access to a nurse 24-7, just call the main number to the clinic [phone] and do not press any options, hold on the line and a nurse will answer the phone.    For prescription refill requests, have your pharmacy contact our office and allow 72 hours.    Due to Covid, you will need to wear a mask upon entering the hospital. If you do not  have a mask, a mask will be given to you at the Main Entrance upon arrival. For doctor visits, patients may have 1 support person age 44 or older with them. For treatment visits, patients can not have anyone with them due to social distancing guidelines and our immunocompromised population.

## 2022-09-01 DIAGNOSIS — G894 Chronic pain syndrome: Secondary | ICD-10-CM | POA: Diagnosis not present

## 2022-09-04 DIAGNOSIS — M160 Bilateral primary osteoarthritis of hip: Secondary | ICD-10-CM | POA: Diagnosis not present

## 2022-09-04 DIAGNOSIS — E7800 Pure hypercholesterolemia, unspecified: Secondary | ICD-10-CM | POA: Diagnosis not present

## 2022-09-04 DIAGNOSIS — Z79899 Other long term (current) drug therapy: Secondary | ICD-10-CM | POA: Diagnosis not present

## 2022-09-04 DIAGNOSIS — G8929 Other chronic pain: Secondary | ICD-10-CM | POA: Diagnosis not present

## 2022-09-04 DIAGNOSIS — M25551 Pain in right hip: Secondary | ICD-10-CM | POA: Diagnosis not present

## 2022-09-04 DIAGNOSIS — Z6834 Body mass index (BMI) 34.0-34.9, adult: Secondary | ICD-10-CM | POA: Diagnosis not present

## 2022-09-04 DIAGNOSIS — Z0130 Encounter for examination of blood pressure without abnormal findings: Secondary | ICD-10-CM | POA: Diagnosis not present

## 2022-09-04 DIAGNOSIS — F419 Anxiety disorder, unspecified: Secondary | ICD-10-CM | POA: Diagnosis not present

## 2022-09-04 DIAGNOSIS — M25552 Pain in left hip: Secondary | ICD-10-CM | POA: Diagnosis not present

## 2022-09-04 DIAGNOSIS — M797 Fibromyalgia: Secondary | ICD-10-CM | POA: Diagnosis not present

## 2022-09-04 DIAGNOSIS — M19011 Primary osteoarthritis, right shoulder: Secondary | ICD-10-CM | POA: Diagnosis not present

## 2022-09-04 DIAGNOSIS — F32A Depression, unspecified: Secondary | ICD-10-CM | POA: Diagnosis not present

## 2022-09-04 DIAGNOSIS — E119 Type 2 diabetes mellitus without complications: Secondary | ICD-10-CM | POA: Diagnosis not present

## 2022-09-09 DIAGNOSIS — I10 Essential (primary) hypertension: Secondary | ICD-10-CM | POA: Diagnosis not present

## 2022-09-09 DIAGNOSIS — E669 Obesity, unspecified: Secondary | ICD-10-CM | POA: Diagnosis not present

## 2022-10-04 ENCOUNTER — Other Ambulatory Visit (HOSPITAL_COMMUNITY): Payer: Self-pay | Admitting: Internal Medicine

## 2022-10-04 DIAGNOSIS — Z1231 Encounter for screening mammogram for malignant neoplasm of breast: Secondary | ICD-10-CM

## 2022-10-05 ENCOUNTER — Other Ambulatory Visit (HOSPITAL_COMMUNITY): Payer: Self-pay | Admitting: Psychiatry

## 2022-10-05 ENCOUNTER — Telehealth (HOSPITAL_COMMUNITY): Payer: Self-pay

## 2022-10-05 MED ORDER — CLONAZEPAM 0.5 MG PO TABS: 0.5000 mg | ORAL_TABLET | Freq: Two times a day (BID) | ORAL | 2 refills | Status: DC

## 2022-10-05 NOTE — Telephone Encounter (Signed)
Last one sent WAS for 0.5 mg, but I resent it

## 2022-10-05 NOTE — Telephone Encounter (Signed)
Thanks, I told them that but they said that they filled 1 mg

## 2022-10-05 NOTE — Telephone Encounter (Signed)
Michelle Clements [facility] called in stating that they need a new prescription sent for clonazepam 0.5 MG tab sent because the last one they filled was '1mg'$  and the pt has a hard time breaking it so they need a new script. Please advise

## 2022-10-06 ENCOUNTER — Encounter (HOSPITAL_COMMUNITY): Payer: Self-pay | Admitting: Psychiatry

## 2022-10-06 ENCOUNTER — Telehealth (INDEPENDENT_AMBULATORY_CARE_PROVIDER_SITE_OTHER): Payer: HMO | Admitting: Psychiatry

## 2022-10-06 DIAGNOSIS — F319 Bipolar disorder, unspecified: Secondary | ICD-10-CM

## 2022-10-06 MED ORDER — CLONAZEPAM 0.5 MG PO TABS: ORAL_TABLET | ORAL | 2 refills | Status: DC

## 2022-10-06 MED ORDER — FLUOXETINE HCL 40 MG PO CAPS: 40.0000 mg | ORAL_CAPSULE | Freq: Every day | ORAL | 2 refills | Status: DC

## 2022-10-06 MED ORDER — LAMOTRIGINE 25 MG PO TABS: ORAL_TABLET | ORAL | 2 refills | Status: DC

## 2022-10-06 MED ORDER — TRAZODONE HCL 50 MG PO TABS: 50.0000 mg | ORAL_TABLET | Freq: Every day | ORAL | 2 refills | Status: DC

## 2022-10-06 NOTE — Progress Notes (Signed)
Virtual Visit via Video Note  I connected with Michelle Clements on 10/06/22 at  8:40 AM EDT by a video enabled telemedicine application and verified that I am speaking with the correct person using two identifiers.  Location: Patient: home Provider: office   I discussed the limitations of evaluation and management by telemedicine and the availability of in person appointments. The patient expressed understanding and agreed to proceed.     I discussed the assessment and treatment plan with the patient. The patient was provided an opportunity to ask questions and all were answered. The patient agreed with the plan and demonstrated an understanding of the instructions.   The patient was advised to call back or seek an in-person evaluation if the symptoms worsen or if the condition fails to improve as anticipated.  I provided 20 minutes of non-face-to-face time during this encounter.   [name], MD  [facility] MD/PA/NP OP Progress Note  10/06/2022 9:09 AM Michelle Clements  MRN:  [id number]  Chief Complaint:  Chief Complaint  Patient presents with   Anxiety   Depression   Follow-up   HPI:  Visit Diagnosis:  This patient is a 56year-old married white female who lives with her husband in [place]. She has two daughters  who live out of the home. 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  She states that she wanted an earlier visit because she has not been sleeping well.  She is waking up at 1 or 2 in the morning and cannot get back to sleep.  She does have a lot of chronic pain and is currently not on any pain medicine.  The last when she tried was [place] and it caused "bone pain."  She states that she is having more joint pain and wonders if it is from Lamictal which I think is low probability.  She denies significant depression and actually the Lamictal has really helped her mood.  She denies significant anxiety or thoughts of  self-harm or suicide.  Since she is not sleeping well I suggested we retry trazodone and she is in agreement   Past Psychiatric History: Admission earlier this year for overdose/suicide attempt followed by partial hospital program.  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[place])    Depression    Essential hypertension, benign    Fibromyalgia    GERD (gastroesophageal reflux disease)    H/O sleep apnea    Hip pain, left    History of palpitations    Negative Holter monitor   History of pneumonia 1988   Hyperlipidemia    IBS (irritable bowel syndrome)    Neck pain    Poor short term memory    PTSD (post-traumatic stress disorder)    S/P Botox injection 11/2014    For migraine headaches   Type 2 diabetes mellitus ([place])    Urgency of urination     Past Surgical History:  Procedure Laterality Date   CARPAL TUNNEL RELEASE Right 06/10/2013   Procedure: CARPAL TUNNEL RELEASE;  Surgeon: Faythe Ghee, MD;  Location: [facility];  Service: Neurosurgery;  Laterality: Right;  Right Carpal Tunnel Release    CHOLECYSTECTOMY     DENTAL SURGERY  12/2014   mole exc     x 3   MUSCLE BIOPSY  2008   Right leg   TUBAL LIGATION      Family Psychiatric History: See below Family History:  Family History  Problem Relation Age of Onset   Fibromyalgia Mother    Diabetes type II Mother    Depression Mother    Alzheimer's disease Mother    GER disease Mother    Heart disease Mother    Paranoid behavior Mother    Dementia Mother    Heart attack Father 13       MI x5   Stroke Father        CVA x7   Asthma Father    Brain cancer Father    Seizures Father    Anxiety disorder Sister    OCD Sister    Sexual abuse Sister     Physical abuse Sister    Anxiety disorder Sister    ADD / ADHD Daughter    ADD / ADHD Daughter    Alcohol abuse Neg Hx    Drug abuse Neg Hx    Schizophrenia Neg Hx     Social History:  Social History   Socioeconomic History   Marital status: Married    Spouse name: Not on file   Number of children: 2   Years of education: College   Highest education level: Associate degree: academic program  Occupational History   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   Caffeine use: 2 cups coffee in morning and 2 cups tea during the day    Social Determinants of Health   Financial Resource Strain: Low Risk  (07/27/2021)   Overall Financial Resource Strain (CARDIA)    Difficulty of Paying Living Expenses: Not very hard  Food Insecurity: No Food Insecurity (07/27/2021)   Hunger Vital Sign    Worried About Running Out of Food in the Last Year: Never true    Ran Out of Food in the Last Year: Never true  Transportation Needs: No Transportation Needs (07/27/2021)   PRAPARE - Hydrologist (Medical): No    Lack of Transportation (Non-Medical): No  Physical Activity: Inactive (07/27/2021)   Exercise Vital Sign    Days of Exercise per Week: 0 days    Minutes of Exercise per Session: 0 min  Stress: No Stress Concern Present (07/27/2021)   [place]    Feeling of Stress :  Not at all  Social Connections: Moderately Integrated (07/27/2021)   Social Connection and Isolation Panel [NHANES]    Frequency of Communication with Friends and Family: More than three times a week  Frequency of Social Gatherings with Friends and Family: More than three times a week    Attends Religious Services: More than 4 times per year    Active Member of Clubs or Organizations: No    Attends Archivist Meetings: Never    Marital Status: Married    Allergies:  Allergies  Allergen Reactions   Molds & Smuts Shortness Of Breath    wheezing   Other Anaphylaxis, Shortness Of Breath and Other (See Comments)     : Angioedema wheezing Wheezing wheezing Other reaction(s): Cough Wheezing Itching and rash Wheezing wheezing Wheezing wheezing Other reaction(s): Other (See Comments)  : Angioedema wheezing Wheezing wheezing Other reaction(s): Cough Wheezing Itching and rash Wheezing wheezing Other reaction(s): Other (See Comments) Wheezing wheezing  : Angioedema wheezing Wheezing wheezing Other reaction(s): Cough Wheezing Itching and rash Wheezing wheezing Wheezing wheezing   Sulfa Antibiotics Anaphylaxis     : Angioedema    Sulfonamide Derivatives Anaphylaxis     : Angioedema   Trichophyton Shortness Of Breath    _0  wheezing wheezing wheezing wheezing   Carbamazepine Rash   Dust Mite Extract Cough and Other (See Comments)    Wheezing Other reaction(s): Wheezing Wheezing Wheezing Other reaction(s): Wheezing Wheezing Wheezing Other reaction(s): Wheezing Wheezing   Gluten Meal Itching   Clonazepam Other (See Comments)    Confusion and memory loss Caused nconfusion    Metabolic Disorder Labs: Lab Results  Component Value Date   HGBA1C 5.7 (A) 08/11/2022   MPG 131 03/18/2015   No results found for: "PROLACTIN" Lab Results  Component Value Date   CHOL 196 08/06/2021   TRIG 199 (A)  03/31/2022   HDL 54 08/06/2021   CHOLHDL 4.2 03/18/2015   VLDL 44 (H) 03/18/2015   LDLCALC 126 03/31/2022   LDLCALC 117 08/06/2021   Lab Results  Component Value Date   TSH 2.69 12/29/2020   TSH 1.66 09/14/2020    Therapeutic Level Labs: Lab Results  Component Value Date   LITHIUM 0.20 (L) 02/21/2012   No results found for: "VALPROATE" No results found for: "CBMZ"  Current Medications: Current Outpatient Medications  Medication Sig Dispense Refill   traZODone (DESYREL) 50 MG tablet Take 1 tablet (50 mg total) by mouth at bedtime. 30 tablet 2   albuterol (VENTOLIN HFA) 108 (90 Base) MCG/ACT inhaler albuterol sulfate HFA 90 mcg/actuation aerosol inhaler  INHALE TWO PUFFS BY MOUTH INTO LUNGS EVERY 6 HOURS AS NEEDED     Blood Glucose Monitoring Suppl ([place]) w/Device KIT Use as directed to check blood glucose twice daily, before breakfast and before bed. 1 kit 0   cetirizine (ZYRTEC) 10 MG tablet Take 10 mg by mouth at bedtime.     Cholecalciferol (VITAMIN D) 50 MCG (2000 UT) tablet Take 2,000 Units by mouth daily.     clonazePAM (KLONOPIN) 0.5 MG tablet Take one in the am and two at bedtime 90 tablet 2   cycloSPORINE (RESTASIS) 0.05 % ophthalmic emulsion Apply to eye.     estradiol (ESTRACE) 0.1 MG/GM vaginal cream Place vaginally.     FLUoxetine (PROZAC) 40 MG capsule Take 1 capsule (40 mg total) by mouth daily. 30 capsule 2   fluticasone (FLONASE) 50 MCG/ACT nasal spray Place 1 spray into both nostrils daily.     GAMUNEX-C 20 GM/200ML SOLN      Glucos-Chond-Hyal Ac-Ca Fructo (MOVE FREE JOINT HEALTH ADVANCE PO) Take by mouth.     insulin glargine (LANTUS SOLOSTAR) 100  UNIT/ML Solostar Pen Inject 45 Units into the skin daily. 45 mL 3   lamoTRIgine (LAMICTAL) 25 MG tablet Take one tablet daily for 2 weeks, then increase to one twice a week 60 tablet 2   Lancets (ONETOUCH ULTRASOFT) lancets 1 each by Other route as needed.      lidocaine (LIDODERM) 5 %  lidocaine 5 % topical patch  APPLY 1 PATCH BY TOPICAL ROUTE ONCE DAILY (MAY WEAR UP TO 12HOURS.)     lisinopril (ZESTRIL) 5 MG tablet      loratadine (CLARITIN) 10 MG tablet Take 10 mg by mouth daily.     Melatonin 10 MG TABS Take 1 tablet every day by oral route at bedtime.     Multiple Vitamin (MULTIVITAMIN) tablet Take 1 tablet by mouth daily.     omeprazole (PRILOSEC) 20 MG capsule Take 20 mg by mouth daily.     ONETOUCH VERIO test strip 4 (four) times daily.     pregabalin (LYRICA) 200 MG capsule Take 200 mg by mouth 3 (three) times daily.     rosuvastatin (CRESTOR) 20 MG tablet TAKE ONE TABLET BY MOUTH EVERYDAY AT BEDTIME 90 tablet 0   sennosides-docusate sodium (SENOKOT-S) 8.6-50 MG tablet Take 1 tablet by mouth daily.     tiZANidine (ZANAFLEX) 4 MG tablet Take 4 mg by mouth daily. 27m at bedtime and 2 mg during the day     zinc gluconate 50 MG tablet Take 50 mg by mouth daily.     No current facility-administered medications for this visit.     Musculoskeletal: Strength & Muscle Tone: within normal limits Gait & Station: normal Patient leans: N/A  Psychiatric Specialty Exam: Review of Systems  Constitutional:  Positive for fatigue.  Musculoskeletal:  Positive for arthralgias, back pain, myalgias and neck pain.  Psychiatric/Behavioral:  Positive for sleep disturbance.   All other systems reviewed and are negative.   Last menstrual period 01/04/2018.There is no height or weight on file to calculate BMI.  General Appearance: Casual, Neat, and Well Groomed  Eye Contact:  Good  Speech:  Clear and Coherent  Volume:  Normal  Mood:  Euthymic  Affect:  Congruent  Thought Process:  Goal Directed  Orientation:  Full (Time, Place, and Person)  Thought Content: WDL   Suicidal Thoughts:  No  Homicidal Thoughts:  No  Memory:  Immediate;   Good Recent;   Good Remote;   Fair  Judgement:  Good  Insight:  Good  Psychomotor Activity:  Decreased  Concentration:  Concentration: Good  and Attention Span: Good  Recall:  Good  Fund of Knowledge: Good  Language: Good  Akathisia:  No  Handed:  Right  AIMS (if indicated): not done  Assets:  Communication Skills Desire for Improvement Resilience Social Support Talents/Skills  ADL's:  Intact  Cognition: WNL  Sleep:  Poor   Screenings: Mini-Mental    Flowsheet Row [facility]  Total Score (max 30 points ) 27      PHQ2-9    Flowsheet Row [facility] Visit from [facility]20[facility] Visit from [facility] [facility] Visit from [facility]/16/2022 [facility] [facility] Visit [facility]20[facility]  PHQ-2 Total Score 0 0 2 0 0  PHQ-9 Total Score -- -- 7 0 4      Flowsheet Row Video  Visit from 08/23/2022 in [place] Video Visit from 06/20/2022 in [place] Video Visit from 03/16/2022 in [facility] [facility]  C-SSRS RISK CATEGORY No Risk No Risk Error: Q3, 4, or 5 should not be populated when Q2 is No        Assessment and Plan: This patient is a 56 year old female with a history of PTSD possible bipolar disorder depression anxiety and hallucinations.  For now she will continue Lamictal 25 mg twice daily for mood swings as it seems to have helped.  She will also continue Prozac 40 mg daily for depression.  Trazodone 50 mg will be added for sleep.  She will continue clonazepam 0.5 mg in the morning and 1 mg at bedtime for anxiety.  She will return to see me in 4 weeks  Collaboration of Care: Collaboration of Care: Primary Care Provider AEB notes will be shared with PCP at patient  request  Patient/Guardian was advised Release of Information must be obtained prior to any record release in order to collaborate their care with an outside provider. Patient/Guardian was advised if they have not already done so to contact the registration department to sign all necessary forms in order for [place] to release information regarding their care.   Consent: Patient/Guardian gives verbal consent for treatment and assignment of benefits for services provided during this visit. Patient/Guardian expressed understanding and agreed to proceed.    [name], MD 10/06/2022, 9:09 AM

## 2022-10-12 ENCOUNTER — Telehealth: Payer: Self-pay | Admitting: Nurse Practitioner

## 2022-10-12 DIAGNOSIS — E1165 Type 2 diabetes mellitus with hyperglycemia: Secondary | ICD-10-CM

## 2022-10-12 NOTE — Telephone Encounter (Signed)
Usually, patients don't need a referral to see podiatry for diabetes foot care.  They can typically call one and ask if they take their insurance and schedule an appt.  I did place a referral though.

## 2022-10-12 NOTE — Telephone Encounter (Signed)
New message    Patient is asking for a referral Podiatry in [place] to get her feet check .

## 2022-10-13 NOTE — Telephone Encounter (Signed)
Patient was called and message was left with Michelle Clements's recommendation. Patient was also advised , if she needs further help from [place] to please call our office.

## 2022-10-21 ENCOUNTER — Ambulatory Visit: Payer: HMO | Admitting: Podiatry

## 2022-10-21 DIAGNOSIS — M722 Plantar fascial fibromatosis: Secondary | ICD-10-CM | POA: Diagnosis not present

## 2022-10-21 NOTE — Progress Notes (Unsigned)
Subjective:  Patient ID: Michelle Clements, female    DOB: 06/07/1966,  MRN: [id number]  Chief Complaint  Patient presents with   Diabetes    56 y.o. female presents with the above complaint.  Patient presents with bilateral heel pain that has been going on for quite some time is progressive gotten worse.  She is a diabetic.  She wanted to get it evaluated.  She has not seen anyone as prior to seeing me.  Pain scale 7 out of 10 sharp shooting in nature hurts with taking for step in the morning.   Review of Systems: Negative except as noted in the HPI. Denies N/V/F/Ch.  Past Medical History:  Diagnosis Date   Allergic rhinitis    Anemia    Arthritis    Lower back and hips   Asthma    Bipolar disorder (HCC)    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HCC)    Constipation    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HCC)    Depression    Essential hypertension, benign    Fibromyalgia    GERD (gastroesophageal reflux disease)    H/O sleep apnea    Hip pain, left    History of palpitations    Negative Holter monitor   History of pneumonia 1988   Hyperlipidemia    IBS (irritable bowel syndrome)    Neck pain    Poor short term memory    PTSD (post-traumatic stress disorder)    S/P Botox injection 11/2014    For migraine headaches   Type 2 diabetes mellitus (HCC)    Urgency of urination     Current Outpatient Medications:    albuterol (VENTOLIN HFA) 108 (90 Base) MCG/ACT inhaler, albuterol sulfate HFA 90 mcg/actuation aerosol inhaler  INHALE TWO PUFFS BY MOUTH INTO LUNGS EVERY 6 HOURS AS NEEDED, Disp: , Rfl:    Blood Glucose Monitoring Suppl (ONETOUCH VERIO FLEX SYSTEM) w/Device KIT, Use as directed to check blood glucose twice daily, before breakfast and before bed., Disp: 1 kit, Rfl: 0   cetirizine (ZYRTEC) 10 MG tablet, Take 10 mg by mouth at bedtime., Disp: , Rfl:    Cholecalciferol (VITAMIN D) 50 MCG (2000 UT) tablet, Take 2,000 Units by mouth daily., Disp: , Rfl:    clonazePAM  (KLONOPIN) 0.5 MG tablet, Take one in the am and two at bedtime, Disp: 90 tablet, Rfl: 2   cycloSPORINE (RESTASIS) 0.05 % ophthalmic emulsion, Apply to eye., Disp: , Rfl:    estradiol (ESTRACE) 0.1 MG/GM vaginal cream, Place vaginally., Disp: , Rfl:    FLUoxetine (PROZAC) 40 MG capsule, Take 1 capsule (40 mg total) by mouth daily., Disp: 30 capsule, Rfl: 2   fluticasone (FLONASE) 50 MCG/ACT nasal spray, Place 1 spray into both nostrils daily., Disp: , Rfl:    GAMUNEX-C 20 GM/200ML SOLN, , Disp: , Rfl:    Glucos-Chond-Hyal Ac-Ca Fructo (MOVE FREE JOINT HEALTH ADVANCE PO), Take by mouth., Disp: , Rfl:    insulin glargine (LANTUS SOLOSTAR) 100 UNIT/ML Solostar Pen, Inject 45 Units into the skin daily., Disp: 45 mL, Rfl: 3   lamoTRIgine (LAMICTAL) 25 MG tablet, Take one tablet daily for 2 weeks, then increase to one twice a week, Disp: 60 tablet, Rfl: 2   Lancets (ONETOUCH ULTRASOFT) lancets, 1 each by Other route as needed. , Disp: , Rfl:    lidocaine (LIDODERM) 5 %, lidocaine 5 % topical patch  APPLY 1 PATCH BY TOPICAL ROUTE ONCE DAILY (MAY WEAR UP TO 12HOURS.), Disp: , Rfl:  lisinopril (ZESTRIL) 5 MG tablet, , Disp: , Rfl:    loratadine (CLARITIN) 10 MG tablet, Take 10 mg by mouth daily., Disp: , Rfl:    Melatonin 10 MG TABS, Take 1 tablet every day by oral route at bedtime., Disp: , Rfl:    Multiple Vitamin (MULTIVITAMIN) tablet, Take 1 tablet by mouth daily., Disp: , Rfl:    omeprazole (PRILOSEC) 20 MG capsule, Take 20 mg by mouth daily., Disp: , Rfl:    ONETOUCH VERIO test strip, 4 (four) times daily., Disp: , Rfl:    pregabalin (LYRICA) 200 MG capsule, Take 200 mg by mouth 3 (three) times daily., Disp: , Rfl:    rosuvastatin (CRESTOR) 20 MG tablet, TAKE ONE TABLET BY MOUTH EVERYDAY AT BEDTIME, Disp: 90 tablet, Rfl: 0   sennosides-docusate sodium (SENOKOT-S) 8.6-50 MG tablet, Take 1 tablet by mouth daily., Disp: , Rfl:    tiZANidine (ZANAFLEX) 4 MG tablet, Take 4 mg by mouth daily. 4mg  at  bedtime and 2 mg during the day, Disp: , Rfl:    traZODone (DESYREL) 50 MG tablet, Take 1 tablet (50 mg total) by mouth at bedtime., Disp: 30 tablet, Rfl: 2   zinc gluconate 50 MG tablet, Take 50 mg by mouth daily., Disp: , Rfl:   Social History   Tobacco Use  Smoking Status Former   Types: Cigarettes   Quit date: 11/04/2010   Years since quitting: 11.9  Smokeless Tobacco Never    Allergies  Allergen Reactions   Molds & Smuts Shortness Of Breath    wheezing   Other Anaphylaxis, Shortness Of Breath and Other (See Comments)     : Angioedema wheezing Wheezing wheezing Other reaction(s): Cough Wheezing Itching and rash Wheezing wheezing Wheezing wheezing Other reaction(s): Other (See Comments)  : Angioedema wheezing Wheezing wheezing Other reaction(s): Cough Wheezing Itching and rash Wheezing wheezing Other reaction(s): Other (See Comments) Wheezing wheezing  : Angioedema wheezing Wheezing wheezing Other reaction(s): Cough Wheezing Itching and rash Wheezing wheezing Wheezing wheezing   Sulfa Antibiotics Anaphylaxis     : Angioedema    Sulfonamide Derivatives Anaphylaxis     : Angioedema   Trichophyton Shortness Of Breath    wheezing wheezing wheezing wheezing wheezing wheezing wheezing wheezing wheezing   Carbamazepine Rash   Dust Mite Extract Cough and Other (See Comments)    Wheezing Other reaction(s): Wheezing Wheezing Wheezing Other reaction(s): Wheezing Wheezing Wheezing Other reaction(s): Wheezing Wheezing   Gluten Meal Itching   Clonazepam Other (See Comments)    Confusion and memory loss Caused nconfusion   Objective:  There were no vitals filed for this visit. There is no height or weight on file to calculate BMI. Constitutional Well developed. Well nourished.  Vascular Dorsalis pedis pulses palpable bilaterally. Posterior tibial pulses palpable bilaterally. Capillary refill normal to all digits.  No cyanosis or clubbing  noted. Pedal hair growth normal.  Neurologic Normal speech. Oriented to person, place, and time. Epicritic sensation to light touch grossly present bilaterally.  Dermatologic Nails well groomed and normal in appearance. No open wounds. No skin lesions.  Orthopedic: Normal joint ROM without pain or crepitus bilaterally. No visible deformities. Tender to palpation at the calcaneal tuber bilaterally. No pain with calcaneal squeeze bilaterally. Ankle ROM diminished range of motion bilaterally. Silf[place]old Test: positive bilaterally.   Radiographs none  Assessment:   1. Plantar fasciitis of right foot   2. Plantar fasciitis of left foot    Plan:  Patient was evaluated and treated and all questions answered.  Plantar Fasciitis, bilaterally - XR reviewed as above.  - Educated on icing and stretching. Instructions given.  - Injection delivered to the plantar fascia as below. - DME: Plantar fascial brace dispensed to support the medial longitudinal arch of the foot and offload pressure from the heel and prevent arch collapse during weightbearing - Pharmacologic management: None  Procedure: Injection Tendon/Ligament Location: Bilateral plantar fascia at the glabrous junction; medial approach. Skin Prep: alcohol Injectate: 0.5 cc 0.5% marcaine plain, 0.5 cc of 1% Lidocaine, 0.5 cc kenalog 10. Disposition: Patient tolerated procedure well. Injection site dressed with a band-aid.  No follow-ups on file.

## 2022-10-25 ENCOUNTER — Encounter: Payer: Self-pay | Admitting: Podiatry

## 2022-10-31 ENCOUNTER — Encounter: Payer: Self-pay | Admitting: Hematology

## 2022-11-02 ENCOUNTER — Encounter (HOSPITAL_COMMUNITY): Payer: Self-pay | Admitting: Hematology & Oncology

## 2022-11-03 ENCOUNTER — Ambulatory Visit (HOSPITAL_COMMUNITY)
Admission: RE | Admit: 2022-11-03 | Discharge: 2022-11-03 | Disposition: A | Discharge status: Home / Self Care | Payer: PPO | Source: Ambulatory Visit | Attending: Internal Medicine | Admitting: Internal Medicine

## 2022-11-03 ENCOUNTER — Encounter (HOSPITAL_COMMUNITY): Payer: Self-pay

## 2022-11-03 DIAGNOSIS — Z1231 Encounter for screening mammogram for malignant neoplasm of breast: Secondary | ICD-10-CM | POA: Insufficient documentation

## 2022-11-04 ENCOUNTER — Telehealth (INDEPENDENT_AMBULATORY_CARE_PROVIDER_SITE_OTHER): Payer: PPO | Admitting: Psychiatry

## 2022-11-04 ENCOUNTER — Encounter (HOSPITAL_COMMUNITY): Payer: Self-pay | Admitting: Psychiatry

## 2022-11-04 DIAGNOSIS — F319 Bipolar disorder, unspecified: Secondary | ICD-10-CM

## 2022-11-04 MED ORDER — TRAZODONE HCL 50 MG PO TABS: 50.0000 mg | ORAL_TABLET | Freq: Every day | ORAL | 2 refills | Status: DC

## 2022-11-04 MED ORDER — BUPROPION HCL ER (XL) 150 MG PO TB24: 150.0000 mg | ORAL_TABLET | ORAL | 2 refills | Status: DC

## 2022-11-04 MED ORDER — FLUOXETINE HCL 40 MG PO CAPS: 40.0000 mg | ORAL_CAPSULE | Freq: Every day | ORAL | 2 refills | Status: DC

## 2022-11-04 MED ORDER — CLONAZEPAM 0.5 MG PO TABS: ORAL_TABLET | ORAL | 2 refills | Status: DC

## 2022-11-04 MED ORDER — LAMOTRIGINE 25 MG PO TABS: ORAL_TABLET | ORAL | 2 refills | Status: DC

## 2022-11-04 NOTE — Progress Notes (Signed)
Virtual Visit via Video Note  I connected with Michelle Clements on 11/04/22 at  9:20 AM EST by a video enabled telemedicine application and verified that I am speaking with the correct person using two identifiers.  Location: Patient: home Provider: home office   I discussed the limitations of evaluation and management by telemedicine and the availability of in person appointments. The patient expressed understanding and agreed to proceed.     I discussed the assessment and treatment plan with the patient. The patient was provided an opportunity to ask questions and all were answered. The patient agreed with the plan and demonstrated an understanding of the instructions.   The patient was advised to call back or seek an in-person evaluation if the symptoms worsen or if the condition fails to improve as anticipated.  I provided 15 minutes of non-face-to-face time during this encounter.   [name], MD  [facility] MD/PA/NP OP Progress Note  11/04/2022 9:48 AM Michelle Clements  MRN:  [id number]  Chief Complaint:  Chief Complaint  Patient presents with   Anxiety   Depression   Follow-up   HPI: This patient is a 56year-old married white female who lives with her husband in [place]. She has two daughters  who live out of the home. 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  She states that she has been more depressed lately.  She sees her husband advancing in his care at the fire department and she is "just sitting here around a lot."  I urged her to try not to compare herself.  She feels like he does not pay her enough attention or takes her to a home-improvement store and to show her attention.  I also urged her to talk to him about the things she wants to do.  She has been more depressed lately.  She has diffuse joint pain and is going to ask her PCP for referral to rheumatology.  She is sleeping better with the trazodone.  We have tried  numerous antidepressants but she has not been on Wellbutrin for a long time and I suggested adding this to her regimen and she is in agreement.  She denies suicidal ideation. Visit Diagnosis:    ICD-10-CM   1. Bipolar 1 disorder (HCC)  F31.9       Past Psychiatric History:  Admission earlier this year for overdose/suicide attempt followed by partial hospital program.  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[place])    Depression    Essential hypertension, benign    Fibromyalgia    GERD (gastroesophageal reflux disease)    H/O sleep apnea    Hip pain, left    History of palpitations    Negative Holter monitor   History of pneumonia 1988   Hyperlipidemia    IBS (irritable bowel syndrome)    Neck pain    Poor short term memory    PTSD (post-traumatic stress disorder)    S/P Botox injection 11/2014    For migraine headaches   Type 2 diabetes mellitus ([place])  Urgency of urination     Past Surgical History:  Procedure Laterality Date   CARPAL TUNNEL RELEASE Right 06/10/2013   Procedure: CARPAL TUNNEL RELEASE;  Surgeon: Faythe Ghee, MD;  Location: [place] [facility];  Service: Neurosurgery;  Laterality: Right;  Right Carpal Tunnel Release    CHOLECYSTECTOMY     DENTAL SURGERY  12/2014   mole exc     x 3   MUSCLE BIOPSY  2008   Right leg   TUBAL LIGATION      Family Psychiatric History: see below  Family History:  Family History  Problem Relation Age of Onset   Fibromyalgia Mother    Diabetes type II Mother    Depression Mother    Alzheimer's disease Mother    GER disease Mother    Heart disease Mother    Paranoid behavior Mother    Dementia Mother    Heart attack Father 61       MI x5   Stroke  Father        CVA x7   Asthma Father    Brain cancer Father    Seizures Father    Anxiety disorder Sister    OCD Sister    Sexual abuse Sister    Physical abuse Sister    Anxiety disorder Sister    ADD / ADHD Daughter    ADD / ADHD Daughter    Alcohol abuse Neg Hx    Drug abuse Neg Hx    Schizophrenia Neg Hx     Social History:  Social History   Socioeconomic History   Marital status: Married    Spouse name: Not on file   Number of children: 2   Years of education: College   Highest education level: Associate degree: academic program  Occupational History   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   Caffeine use: 2 cups coffee in morning and 2 cups tea during the day    Social Determinants of Health   Financial Resource Strain: Low Risk  (07/27/2021)   Overall Financial Resource Strain (CARDIA)    Difficulty of Paying Living Expenses: Not very hard  Food Insecurity: No Food Insecurity (07/27/2021)   Hunger Vital Sign    Worried About Running Out of Food in the Last Year: Never true    Ran Out of Food in the Last Year: Never true  Transportation Needs: No Transportation Needs (07/27/2021)   PRAPARE - Hydrologist (Medical): No    Lack of Transportation (Non-Medical): No  Physical Activity: Inactive (07/27/2021)   Exercise Vital Sign    Days of Exercise per Week: 0 days    Minutes of Exercise per Session: 0 min   Stress: No Stress Concern Present (07/27/2021)   [place]  Feeling of Stress : Not at all  Social Connections: Moderately Integrated (07/27/2021)   Social Connection and Isolation Panel [NHANES]    Frequency of Communication with Friends and Family: More than three times a week    Frequency of Social Gatherings with Friends and Family: More than three times a week    Attends Religious Services: More than 4 times per year    Active Member of [employer] or Organizations: No    Attends Archivist Meetings: Never    Marital Status: Married    Allergies:  Allergies  Allergen Reactions   Molds & Smuts Shortness Of Breath    wheezing   Other Anaphylaxis, Shortness Of Breath and Other (See Comments)     : Angioedema wheezing Wheezing wheezing Other reaction(s): Cough Wheezing Itching and rash Wheezing wheezing Wheezing wheezing Other reaction(s): Other (See Comments)  : Angioedema wheezing Wheezing wheezing Other reaction(s): Cough Wheezing Itching and rash Wheezing wheezing Other reaction(s): Other (See Comments) Wheezing wheezing  : Angioedema wheezing Wheezing wheezing Other reaction(s): Cough Wheezing Itching and rash Wheezing wheezing Wheezing wheezing   Sulfa Antibiotics Anaphylaxis     : Angioedema    Sulfonamide Derivatives Anaphylaxis     : Angioedema   Trichophyton Shortness Of Breath    _0  wheezing wheezing wheezing wheezing   Carbamazepine Rash   Dust Mite Extract Cough and Other (See Comments)    Wheezing Other reaction(s): Wheezing Wheezing Wheezing Other reaction(s): Wheezing Wheezing Wheezing Other reaction(s): Wheezing Wheezing   Gluten Meal Itching   Clonazepam Other (See Comments)    Confusion and memory loss Caused nconfusion    Metabolic Disorder Labs: Lab Results  Component Value Date   HGBA1C 5.7  (A) 08/11/2022   MPG 131 03/18/2015   No results found for: "PROLACTIN" Lab Results  Component Value Date   CHOL 196 08/06/2021   TRIG 199 (A) 03/31/2022   HDL 54 08/06/2021   CHOLHDL 4.2 03/18/2015   VLDL 44 (H) 03/18/2015   LDLCALC 126 03/31/2022   LDLCALC 117 08/06/2021   Lab Results  Component Value Date   TSH 2.69 12/29/2020   TSH 1.66 09/14/2020    Therapeutic Level Labs: Lab Results  Component Value Date   LITHIUM 0.20 (L) 02/21/2012   No results found for: "VALPROATE" No results found for: "CBMZ"  Current Medications: Current Outpatient Medications  Medication Sig Dispense Refill   buPROPion (WELLBUTRIN XL) 150 MG 24 hr tablet Take 1 tablet (150 mg total) by mouth every morning. 30 tablet 2   albuterol (VENTOLIN HFA) 108 (90 Base) MCG/ACT inhaler albuterol sulfate HFA 90 mcg/actuation aerosol inhaler  INHALE TWO PUFFS BY MOUTH INTO LUNGS EVERY 6 HOURS AS NEEDED     Blood Glucose Monitoring Suppl ([place]) w/Device KIT Use as directed to check blood glucose twice daily, before breakfast and before bed. 1 kit 0   cetirizine (ZYRTEC) 10 MG tablet Take 10 mg by mouth at bedtime.     Cholecalciferol (VITAMIN D) 50 MCG (2000 UT) tablet Take 2,000 Units by mouth daily.     clonazePAM (KLONOPIN) 0.5 MG tablet Take one in the am and two at bedtime 90 tablet 2   cycloSPORINE (RESTASIS) 0.05 % ophthalmic emulsion Apply to eye.     estradiol (ESTRACE) 0.1 MG/GM vaginal cream Place vaginally.     FLUoxetine (PROZAC) 40 MG capsule Take 1 capsule (40 mg total) by mouth daily. 30 capsule 2   fluticasone (FLONASE) 50 MCG/ACT  nasal spray Place 1 spray into both nostrils daily.     GAMUNEX-C 20 GM/200ML SOLN      Glucos-Chond-Hyal Ac-Ca Fructo (MOVE FREE JOINT HEALTH ADVANCE PO) Take by mouth.     insulin glargine (LANTUS SOLOSTAR) 100 UNIT/ML Solostar Pen Inject 45 Units into the skin daily. 45 mL 3   lamoTRIgine (LAMICTAL) 25 MG tablet Take one tablet daily for  2 weeks, then increase to one twice a week 60 tablet 2   Lancets (ONETOUCH ULTRASOFT) lancets 1 each by Other route as needed.      lidocaine (LIDODERM) 5 % lidocaine 5 % topical patch  APPLY 1 PATCH BY TOPICAL ROUTE ONCE DAILY (MAY WEAR UP TO 12HOURS.)     lisinopril (ZESTRIL) 5 MG tablet      loratadine (CLARITIN) 10 MG tablet Take 10 mg by mouth daily.     Melatonin 10 MG TABS Take 1 tablet every day by oral route at bedtime.     Multiple Vitamin (MULTIVITAMIN) tablet Take 1 tablet by mouth daily.     omeprazole (PRILOSEC) 20 MG capsule Take 20 mg by mouth daily.     ONETOUCH VERIO test strip 4 (four) times daily.     pregabalin (LYRICA) 200 MG capsule Take 200 mg by mouth 3 (three) times daily.     rosuvastatin (CRESTOR) 20 MG tablet TAKE ONE TABLET BY MOUTH EVERYDAY AT BEDTIME 90 tablet 0   sennosides-docusate sodium (SENOKOT-S) 8.6-50 MG tablet Take 1 tablet by mouth daily.     tiZANidine (ZANAFLEX) 4 MG tablet Take 4 mg by mouth daily. 15m at bedtime and 2 mg during the day     traZODone (DESYREL) 50 MG tablet Take 1 tablet (50 mg total) by mouth at bedtime. 30 tablet 2   zinc gluconate 50 MG tablet Take 50 mg by mouth daily.     No current facility-administered medications for this visit.     Musculoskeletal: Strength & Muscle Tone: within normal limits Gait & Station: normal Patient leans: N/A  Psychiatric Specialty Exam: Review of Systems  Constitutional:  Positive for fatigue.  Musculoskeletal:  Positive for arthralgias and myalgias.  Psychiatric/Behavioral:  Positive for dysphoric mood.   All other systems reviewed and are negative.   Last menstrual period 01/04/2018.There is no height or weight on file to calculate BMI.  General Appearance: Casual and Fairly Groomed  Eye Contact:  Good  Speech:  Clear and Coherent  Volume:  Normal  Mood:  Dysphoric  Affect:  Congruent  Thought Process:  Goal Directed  Orientation:  Full (Time, Place, and Person)  Thought  Content: Rumination   Suicidal Thoughts:  No  Homicidal Thoughts:  No  Memory:  Immediate;   Good Recent;   Good Remote;   Good  Judgement:  Good  Insight:  Fair  Psychomotor Activity:  Decreased  Concentration:  Concentration: Fair and Attention Span: Fair  Recall:  Good  Fund of Knowledge: Good  Language: Good  Akathisia:  No  Handed:  Right  AIMS (if indicated): not done  Assets:  Communication Skills Desire for Improvement Resilience Social Support  ADL's:  Intact  Cognition: WNL  Sleep:  Good   Screenings: Mini-Mental    [place] [facility]  Total Score (max 30 points ) 27      PHQ2-9    Flowsheet Row [place] [facility] [place]  [facility] Video Visit from 06/20/2022 in [place] Video Visit from 05/20/2022 in [place] [facility] Video Visit from 03/16/2022 in [facility] [facility]  PHQ-2 Total Score 2 0 0 2 0  PHQ-9 Total Score 10 -- -- 7 0      Flowsheet Row Video Visit from 11/04/2022 in [place] [facility] Video Visit from 08/23/2022 in [facility] Video Visit from 06/20/2022 in [facility]  C-SSRS RISK CATEGORY Error: Question 6 not populated No Risk No Risk        Assessment and Plan: Patient is a 56 year old female with a history of PTSD possible bipolar disorder depression anxiety and hallucinations.  She has been more depressed recently so we will add Wellbutrin XL to her regimen at 150 mg daily.  She will continue Lamictal 25 mg twice daily for mood swings, Prozac 40 mg daily for depression, trazodone 25 to 50 mg at bedtime for sleep and clonazepam  0.5 mg in the morning and 1 mg at bedtime for anxiety.  She will return to see me in 4 weeks  Collaboration of Care: Collaboration of Care: Primary Care Provider AEB notes will be shared with PCP at patient's request  Patient/Guardian was advised Release of Information must be obtained prior to any record release in order to collaborate their care with an outside provider. Patient/Guardian was advised if they have not already done so to contact the registration department to sign all necessary forms in order for [place] to release information regarding their care.   Consent: Patient/Guardian gives verbal consent for treatment and assignment of benefits for services provided during this visit. Patient/Guardian expressed understanding and agreed to proceed.    [name], MD 11/04/2022, 9:49 AM

## 2022-11-07 ENCOUNTER — Other Ambulatory Visit (HOSPITAL_COMMUNITY): Payer: Self-pay | Admitting: Internal Medicine

## 2022-11-07 ENCOUNTER — Encounter: Payer: Self-pay | Admitting: Nurse Practitioner

## 2022-11-07 DIAGNOSIS — N644 Mastodynia: Secondary | ICD-10-CM

## 2022-11-09 DIAGNOSIS — M2550 Pain in unspecified joint: Secondary | ICD-10-CM | POA: Insufficient documentation

## 2022-11-21 DIAGNOSIS — E875 Hyperkalemia: Secondary | ICD-10-CM | POA: Insufficient documentation

## 2022-11-21 DIAGNOSIS — E1122 Type 2 diabetes mellitus with diabetic chronic kidney disease: Secondary | ICD-10-CM | POA: Insufficient documentation

## 2022-11-21 NOTE — Progress Notes (Unsigned)
[facility] [address], [place] [address]   CLINIC:  Medical Oncology/Hematology  PCP:  [name], MD [place] [address] [phone]   REASON FOR VISIT:  Follow-up for hypogammaglobulinemia   CURRENT THERAPY: IVIG  INTERVAL HISTORY:  Michelle Clements 56 y.o. 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  She was last seen by Tarri Abernethy PA-C on 08/29/2022.    At today's visit, she reports feeling fair.  She reports significantly increased joint pain over the past few months, and reports that she had ANA checked by PCP and is now being referred to rheumatology for further workup.     No infections or antibiotics in the past 3 months.  She continues to have mild chest tightness and wheezing from asthma.  She continues to have chronic sinus congestion and drainage, has been followed by ENT.  She denies any new skin rashes.  She denies any dysuria or lower urinary tract symptoms. She has intermittent diarrhea and constipation from her IBS.  No B symptoms.  She continues to have frequent headaches, which began around September 2022 (prior to her first IVIG infusion in November 2022).  There is no temporal association between her headaches and the timing of her IVIG.  She denies any symptoms of meningeal irritation or altered mental status.  No associated fever, chills, muscle spasms, or body aches.   Overall, she is tolerating her IVIG infusions well.  She make sure that she hydrates to the point that she has clear urine.  She is currently receiving premedication with Tylenol and Benadryl.  She feels "tired" on the day of her infusion, but "bounces right back" the next day.   She has 30% energy and 100% appetite. She endorses that she is maintaining a stable weight.   REVIEW OF SYSTEMS:    Review of Systems  Constitutional:  Positive for fatigue. Negative for appetite change, chills, diaphoresis, fever and unexpected  weight change.  HENT:   Positive for trouble swallowing. Negative for lump/mass and nosebleeds.   Eyes:  Negative for eye problems.  Respiratory:  Positive for chest tightness and wheezing (asthma). Negative for cough, hemoptysis and shortness of breath.   Cardiovascular:  Negative for chest pain, leg swelling and palpitations.  Gastrointestinal:  Positive for constipation and diarrhea. Negative for abdominal pain, blood in stool, nausea and vomiting.  Genitourinary:  Negative for hematuria.   Musculoskeletal:  Positive for arthralgias, myalgias and neck pain.  Skin: Negative.   Neurological:  Positive for dizziness, headaches (daily) and numbness. Negative for light-headedness.  Hematological:  Does not bruise/bleed easily.  Psychiatric/Behavioral:  Positive for depression and sleep disturbance. The patient is nervous/anxious.      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[place])    Depression    Essential hypertension, benign    Fibromyalgia    GERD (gastroesophageal reflux disease)    H/O sleep apnea    Hip pain, left    History of palpitations    Negative Holter monitor   History of pneumonia 1988   Hyperlipidemia    IBS (irritable bowel syndrome)    Neck pain    Poor short term  memory    PTSD (post-traumatic stress disorder)    S/P Botox injection 11/2014    For migraine headaches   Type 2 diabetes mellitus ([place])    Urgency of urination    Past Surgical History:  Procedure Laterality Date   CARPAL TUNNEL RELEASE Right 06/10/2013   Procedure: CARPAL TUNNEL RELEASE;  Surgeon: Faythe Ghee, MD;  Location: [facility];  Service: Neurosurgery;  Laterality: Right;  Right Carpal Tunnel Release    CHOLECYSTECTOMY     DENTAL SURGERY  12/2014    mole exc     x 3   MUSCLE BIOPSY  2008   Right leg   TUBAL LIGATION       SOCIAL HISTORY:  Social History   Socioeconomic History   Marital status: Married    Spouse name: Not on file   Number of children: 2   Years of education: College   Highest education level: Associate degree: academic program  Occupational History   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   Caffeine use: 2 cups coffee in morning and 2 cups tea during the day    Social Determinants of Health   Financial Resource Strain: Low Risk  (07/27/2021)   Overall Financial Resource Strain (CARDIA)    Difficulty of Paying Living Expenses: Not very hard  Food Insecurity: No Food Insecurity (07/27/2021)   Hunger Vital Sign    Worried About Running Out of Food in the Last Year: Never true    Ran Out of Food in the Last Year: Never true  Transportation Needs: No Transportation Needs (07/27/2021)   PRAPARE - Hydrologist (Medical): No    Lack of Transportation (Non-Medical): No  Physical Activity: Inactive (07/27/2021)   Exercise Vital Sign    Days of Exercise per Week: 0 days    Minutes of Exercise per Session: 0 min  Stress: No Stress Concern Present (07/27/2021)   [place]    Feeling of Stress : Not at all  Social Connections: Moderately Integrated (07/27/2021)   Social Connection and  Isolation Panel [NHANES]    Frequency of Communication with Friends and Family: More than three times a week    Frequency of Social Gatherings with Friends and Family: More than three times a week    Attends Religious Services: More than 4 times per year    Active Member of [employer] or Organizations: No    Attends Archivist Meetings: Never    Marital Status: Married  Human resources officer Violence: Not At Risk (07/27/2021)   Humiliation, Afraid, Rape, and Kick questionnaire    Fear of Current or Ex-Partner: No    Emotionally Abused: No    Physically Abused: No    Sexually Abused: No    FAMILY HISTORY:  Family History  Problem Relation Age of Onset   Fibromyalgia Mother    Diabetes type II Mother    Depression Mother    Alzheimer's disease Mother    GER disease Mother    Heart disease Mother    Paranoid behavior Mother    Dementia Mother    Heart attack Father 73       MI x5   Stroke Father        CVA x7   Asthma Father    Brain cancer Father    Seizures Father    Anxiety disorder Sister    OCD Sister    Sexual abuse Sister    Physical abuse Sister    Anxiety disorder Sister    ADD / ADHD Daughter    ADD / ADHD Daughter    Alcohol abuse Neg Hx    Drug abuse Neg Hx    Schizophrenia Neg Hx     CURRENT MEDICATIONS:  Outpatient Encounter Medications as of 11/22/2022  Medication Sig Note   albuterol (VENTOLIN HFA) 108 (90 Base) MCG/ACT inhaler albuterol sulfate HFA 90 mcg/actuation aerosol inhaler  INHALE TWO PUFFS BY MOUTH INTO LUNGS EVERY 6 HOURS AS NEEDED    Blood Glucose Monitoring Suppl ([place]) w/Device KIT Use as directed to check blood glucose twice daily, before breakfast and before bed.    buPROPion (WELLBUTRIN XL) 150 MG 24 hr tablet Take 1 tablet (150 mg total) by mouth every morning.    cetirizine (ZYRTEC) 10 MG tablet Take 10 mg by mouth at bedtime.    Cholecalciferol (VITAMIN D) 50 MCG (2000 UT) tablet Take 2,000 Units by mouth  daily.    clonazePAM (KLONOPIN) 0.5 MG tablet Take one in the am and two at bedtime    cycloSPORINE (RESTASIS) 0.05 % ophthalmic emulsion Apply to eye.    estradiol (ESTRACE) 0.1 MG/GM vaginal cream Place vaginally.    FLUoxetine (PROZAC) 40 MG capsule Take 1 capsule (40 mg total) by mouth daily.    fluticasone (FLONASE) 50 MCG/ACT nasal spray Place 1 spray into both nostrils daily.    GAMUNEX-C 20 GM/200ML SOLN     Glucos-Chond-Hyal Ac-Ca Fructo (MOVE FREE JOINT HEALTH ADVANCE PO) Take by mouth.    insulin glargine (LANTUS SOLOSTAR) 100 UNIT/ML Solostar Pen Inject 45 Units into the skin daily.    lamoTRIgine (LAMICTAL) 25 MG tablet Take one tablet daily for 2 weeks, then increase to one twice a week    Lancets (ONETOUCH ULTRASOFT) lancets 1 each by Other route as needed.  04/25/2014: Received from: External Pharmacy   lidocaine (LIDODERM) 5 % lidocaine 5 % topical patch  APPLY 1 PATCH BY TOPICAL ROUTE ONCE DAILY (MAY WEAR UP TO 12HOURS.)    lisinopril (ZESTRIL) 5 MG tablet     loratadine (CLARITIN) 10 MG tablet Take 10 mg by mouth daily.    Melatonin 10 MG TABS Take 1 tablet every day by oral route at bedtime.    Multiple Vitamin (MULTIVITAMIN) tablet Take 1 tablet by mouth daily.    omeprazole (PRILOSEC) 20 MG capsule Take 20 mg by mouth daily.    ONETOUCH VERIO test strip 4 (four) times daily.    pregabalin (LYRICA) 200 MG capsule Take 200 mg by mouth 3 (three) times daily.    rosuvastatin (CRESTOR) 20 MG tablet TAKE ONE TABLET BY MOUTH EVERYDAY AT BEDTIME    sennosides-docusate sodium (SENOKOT-S) 8.6-50 MG tablet Take 1 tablet by mouth daily.    tiZANidine (ZANAFLEX) 4  MG tablet Take 4 mg by mouth daily. 63m at bedtime and 2 mg during the day 03/12/2015: Received from: External Pharmacy Received Sig:    traZODone (DESYREL) 50 MG tablet Take 1 tablet (50 mg total) by mouth at bedtime.    zinc gluconate 50 MG tablet Take 50 mg by mouth daily.    No facility-administered encounter medications  on file as of 11/22/2022.    ALLERGIES:  Allergies  Allergen Reactions   Molds & Smuts Shortness Of Breath    wheezing   Other Anaphylaxis, Shortness Of Breath and Other (See Comments)     : Angioedema wheezing Wheezing wheezing Other reaction(s): Cough Wheezing Itching and rash Wheezing wheezing Wheezing wheezing Other reaction(s): Other (See Comments)  : Angioedema wheezing Wheezing wheezing Other reaction(s): Cough Wheezing Itching and rash Wheezing wheezing Other reaction(s): Other (See Comments) Wheezing wheezing  : Angioedema wheezing Wheezing wheezing Other reaction(s): Cough Wheezing Itching and rash Wheezing wheezing Wheezing wheezing   Sulfa Antibiotics Anaphylaxis     : Angioedema    Sulfonamide Derivatives Anaphylaxis     : Angioedema   Trichophyton Shortness Of Breath    _0  wheezing wheezing wheezing wheezing   Carbamazepine Rash   Dust Mite Extract Cough and Other (See Comments)    Wheezing Other reaction(s): Wheezing Wheezing Wheezing Other reaction(s): Wheezing Wheezing Wheezing Other reaction(s): Wheezing Wheezing   Gluten Meal Itching   Clonazepam Other (See Comments)    Confusion and memory loss Caused nconfusion     PHYSICAL EXAM:  ECOG PERFORMANCE STATUS: 1 - Symptomatic but completely ambulatory    There were no vitals filed for this visit. There were no vitals filed for this visit. Physical Exam Constitutional:      Appearance: Normal appearance. She is obese.  HENT:     Head: Normocephalic and atraumatic.     Mouth/Throat:     Mouth: Mucous membranes are moist.  Eyes:     Extraocular Movements: Extraocular movements intact.     Pupils: Pupils are equal, round, and reactive to light.  Cardiovascular:     Rate and Rhythm: Normal rate and regular rhythm.     Pulses: Normal pulses.     Heart sounds: Normal heart sounds.  Pulmonary:     Effort: Pulmonary effort  is normal.     Breath sounds: No wheezing.  Abdominal:     General: Bowel sounds are normal.     Palpations: Abdomen is soft.     Tenderness: There is no abdominal tenderness.  Musculoskeletal:        General: No swelling.     Right lower leg: No edema.     Left lower leg: No edema.  Lymphadenopathy:     Cervical: No cervical adenopathy.  Skin:    General: Skin is warm and dry.  Neurological:     General: No focal deficit present.     Mental Status: She is alert and oriented to person, place, and time.  Psychiatric:        Mood and Affect: Mood normal.        Behavior: Behavior normal.     LABORATORY DATA:  I have reviewed the labs as listed.  CBC    Component Value Date/Time   WBC 9.0 07/03/2019 1230   RBC 4.64 07/03/2019 1230   HGB 14.7 07/03/2019 1230   HCT 42.8 07/03/2019 1230   PLT 288 07/03/2019 1230   MCV 92.2 07/03/2019 1230   MCH 31.7 07/03/2019 1230  MCHC 34.3 07/03/2019 1230   RDW 12.4 07/03/2019 1230   LYMPHSABS 2.2 07/03/2019 1230   MONOABS 0.5 07/03/2019 1230   EOSABS 0.3 07/03/2019 1230   BASOSABS 0.1 07/03/2019 1230      Latest Ref Rng & Units 03/31/2022   12:00 AM 12/23/2021   12:00 AM 08/06/2021   12:00 AM  CMP  BUN 4 - _0 Creatinine 0.5 - 1.1 1.3     1.2     1.4      Sodium 137 - 147  139     143      Potassium 3.4 - 5.3  5.0     5.0      Chloride 99 - 108  100     102      CO2 13 - _1 Calcium 8.7 - 10.7 10.4     10.1     10.8      Alkaline Phos 25 - 125  83     78      AST 13 - 35 39     43     28      ALT 7 - 35 U/L 39     39     28         This result is from an external source.     DIAGNOSTIC IMAGING:  I have independently reviewed the relevant imaging and discussed with the patient.   ASSESSMENT & PLAN: 1.  Hypogammaglobulinemia with recurrent infections - Labs from October 2015 showed IgG 566 [phone]), IgM 28 (57-237), IgA 65, IgE 12 - Evaluated by [facility] by Dr. Lloyd Huger  (10/27/2014), who noted: " A review of Michelle Clements immune evaluation reveals mild hypogammaglobulinemia (not significant), a low IgM but normal specific antibody titers, normal B cell subsets panels and normal cellular function. Taken together the immune evaluation is normal. I do not believe Michelle Clements has a functional disorder of her immune system. For the hypo IgM this may become significant if the IgM was less that 15. For this I suggest she have an annual IgG, IgM and IgA. Currently the IgM level is not likely to be significant". 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 Lorenso Courier, with first dose on 11/03/2021 - She reports that she has been tolerating her IVIG infusions well, no reported adverse events or systemic reactions.     - No infections or antibiotics over the past 3 months. - No B symptoms - Reports increased joint pain for the past few months.  Found to be ANA positive per PCP, referred to rheumatology.  Unclear at this time if joint pain is related to some underlying autoimmune/rheumatologic process, her chronic fibromyalgia, or if it may be a side effect of her IVIG.  Pending rheumatology consultation for additional input. - Most recent quantitative immunoglobulins (11/08/2022) show IgA low at 32, IgG 749 (at goal), and IgM low at 13.  CBC within normal limits.  CMP checked by PCP (11/15/2022) with creatinine 1.34/GFR 47 (baseline CKD stage IIIa). - Patient receives her home IVIG infusions via [facility] (contact [place] Sandy at [phone], fax [phone], email [email or web address]) - PLAN: Continue IVIG  q. 28 days (via [facility] home [facility] infusion) with every 22-monthclinic visit follow-up. - We will check monthly quantitative immunoglobulins 1 to 2 days before each IVIG treatment.  Therapeutic  goal is IgG around 600.  - Check CBC and CMP with home health lab draw in 3 months. -Will discuss with rheumatology once she has established care with local rheumatologist.  2.  Headaches - Headaches described in HPI above are unlikely to be caused by IVIG, since they began prior to when she started on IVIG and did not show any temporal relationship to her IVIG infusions. - PLAN: Although her headache pattern seems unlikely to be caused by IVIG, recommend to continue slower rate of IVIG infusion and prehydration with 500 mL NS.  - She has also been referred to neurology for work-up of her headaches  3.  Other history - Her past medical history is otherwise notable for fatty liver disease with history of transaminitis, GERD, fibromyalgia, type 2 diabetes mellitus, and IBS   PLAN SUMMARY & DISPOSITION: >> Monthly IVIG infusions via home health >> Monthly lab check (quantitative immunoglobulins) via home health >> Check CBC and CMP in 3 months (home health lab draw) >> Office visit in 3 months  All questions were answered. The patient knows to call the clinic with any problems, questions or concerns.  Medical decision making: Moderate  Time spent on visit: I spent 20 minutes counseling the patient face to face. The total time spent in the appointment was 30 minutes and more than 50% was on counseling.   RHarriett Rush PA-C  11/22/22 11:46 AM

## 2022-11-22 ENCOUNTER — Ambulatory Visit (HOSPITAL_COMMUNITY)
Admission: RE | Admit: 2022-11-22 | Discharge: 2022-11-22 | Disposition: A | Discharge status: Home / Self Care | Payer: PPO | Source: Ambulatory Visit | Attending: Internal Medicine | Admitting: Internal Medicine

## 2022-11-22 ENCOUNTER — Inpatient Hospital Stay: Payer: PPO | Attending: Physician Assistant | Admitting: Physician Assistant

## 2022-11-22 VITALS — BP 133/75 | HR 75 | Temp 97.0°F | Resp 18 | Ht 71.0 in | Wt 243.7 lb

## 2022-11-22 DIAGNOSIS — N644 Mastodynia: Secondary | ICD-10-CM | POA: Insufficient documentation

## 2022-11-22 DIAGNOSIS — R519 Headache, unspecified: Secondary | ICD-10-CM | POA: Diagnosis not present

## 2022-11-22 DIAGNOSIS — D839 Common variable immunodeficiency, unspecified: Secondary | ICD-10-CM

## 2022-11-22 DIAGNOSIS — D801 Nonfamilial hypogammaglobulinemia: Secondary | ICD-10-CM | POA: Diagnosis present

## 2022-11-22 DIAGNOSIS — Z87891 Personal history of nicotine dependence: Secondary | ICD-10-CM | POA: Insufficient documentation

## 2022-11-22 NOTE — Patient Instructions (Signed)
[place] at [facility] Discharge Instructions  You were seen today by Tarri Abernethy PA-C for your immune deficiency (hypogammaglobulinemia) and recurrent infections.  - We will continue IVIG infusions once per month via your home health agency.    - Your home health agency should be checking your immunoglobulin levels before EACH of your IVIG infusions.  - At your lab check in 3 months, home health also needs to check your CBC and CMP.  If these levels are not checked by home health, we will have them added to your next appointment.  - Your headaches do not fit the typical pattern of IVIG-induced headaches.  They may be due to stress, sinus problems, or some other condition.  You have already been referred to see neurology.  I suggest that you follow-up with them regarding the cause of your headaches.  - I am not sure if your joint pain is related to your IVIG or not.  It may be from your chronic pain, fibromyalgia, possible autoimmune/rheumatologic disease, or could also be a side effect of your IVIG.  Since you have been referred to rheumatologist by your primary care doctor, I would like to discuss further with them.  Once you have an appointment with your rheumatologist, please send me a MyChart message so that I can reach out to your rheumatologist as well.  - Speak with your primary care provider regarding your other chronic conditions, especially your asthma and chest tightness.  MEDICATIONS: Continue IVIG every 4 weeks  FOLLOW-UP APPOINTMENT: Office visit in 3 months  ** Thank you for trusting me with your healthcare!  I strive to provide all of my patients with quality care at each visit.  If you receive a survey for this visit, I would be so grateful to you for taking the time to provide feedback.  Thank you in advance!  ~ Lachrisha Ziebarth                   Dr. Derek Jack   &   Tarri Abernethy, PA-C   - - - - - - - - - - - - - - - - - -      Thank you for choosing [place] at [facility] to provide your oncology and hematology care.  To afford each patient quality time with our provider, please arrive at least 15 minutes before your scheduled appointment time.   If you have a lab appointment with the [facility] please come in thru the Main Entrance and check in at the main information desk.  You need to re-schedule your appointment should you arrive 10 or more minutes late.  We strive to give you quality time with our providers, and arriving late affects you and other patients whose appointments are after yours.  Also, if you no show three or more times for appointments you may be dismissed from the clinic at the providers discretion.     Again, thank you for choosing [facility].  Our hope is that these requests will decrease the amount of time that you wait before being seen by our physicians.       _____________________________________________________________  Should you have questions after your visit to [facility], please contact our office at [phone] and follow the prompts.  Our office hours are 8:00 a.m. and 4:30 p.m. Monday - Friday.  Please note that voicemails left after 4:00 p.m. may not be returned  until the following business day.  We are closed weekends and major holidays.  You do have access to a nurse 24-7, just call the main number to the clinic [phone] and do not press any options, hold on the line and a nurse will answer the phone.    For prescription refill requests, have your pharmacy contact our office and allow 72 hours.    Due to Covid, you will need to wear a mask upon entering the hospital. If you do not have a mask, a mask will be given to you at the Main Entrance upon arrival. For doctor visits, patients may have 1 support person age 49 or older with them. For treatment visits, patients can not have anyone with them due to social distancing  guidelines and our immunocompromised population.

## 2022-11-23 ENCOUNTER — Telehealth: Payer: Self-pay

## 2022-11-23 NOTE — Telephone Encounter (Signed)
Called [facility] for Plan of Treatment orders to be faxed for the patients monthly IVIG infusion and labs.  Per the pharmacist the orders are good for a year and the last ones signed were August 2023.

## 2022-11-29 ENCOUNTER — Ambulatory Visit: Payer: PPO | Admitting: Podiatry

## 2022-11-29 DIAGNOSIS — M722 Plantar fascial fibromatosis: Secondary | ICD-10-CM

## 2022-11-29 DIAGNOSIS — Q666 Other congenital valgus deformities of feet: Secondary | ICD-10-CM | POA: Diagnosis not present

## 2022-11-29 DIAGNOSIS — E118 Type 2 diabetes mellitus with unspecified complications: Secondary | ICD-10-CM

## 2022-11-29 DIAGNOSIS — M2042 Other hammer toe(s) (acquired), left foot: Secondary | ICD-10-CM | POA: Diagnosis not present

## 2022-11-29 DIAGNOSIS — Z794 Long term (current) use of insulin: Secondary | ICD-10-CM | POA: Diagnosis not present

## 2022-11-29 DIAGNOSIS — M2041 Other hammer toe(s) (acquired), right foot: Secondary | ICD-10-CM | POA: Diagnosis not present

## 2022-11-29 NOTE — Progress Notes (Addendum)
Subjective:  Patient ID: Michelle Clements, female    DOB: Jul 06, 1966,  MRN: [id number]  Chief Complaint  Patient presents with   Plantar Fasciitis    Pt stated that she has a burning sensation when she takes off her shoes     56 y.o. female presents with the above complaint.  Patient presents with follow-up of bilateral Planter fasciitis.  She states that she is doing better the injection helped.  She would like to get diabetic shoes she would also like to discuss further treatment options she still has some residual pain.   Review of Systems: Negative except as noted in the HPI. Denies N/V/F/Ch.  Past Medical History:  Diagnosis Date   Allergic rhinitis    Anemia    Arthritis    Lower back and hips   Asthma    Bipolar disorder ([place])    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HCC)    Constipation    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HCC)    Depression    Essential hypertension, benign    Fibromyalgia    GERD (gastroesophageal reflux disease)    H/O sleep apnea    Hip pain, left    History of palpitations    Negative Holter monitor   History of pneumonia 1988   Hyperlipidemia    IBS (irritable bowel syndrome)    Neck pain    Poor short term memory    PTSD (post-traumatic stress disorder)    S/P Botox injection 11/2014    For migraine headaches   Type 2 diabetes mellitus (HCC)    Urgency of urination     Current Outpatient Medications:    albuterol (VENTOLIN HFA) 108 (90 Base) MCG/ACT inhaler, albuterol sulfate HFA 90 mcg/actuation aerosol inhaler  INHALE TWO PUFFS BY MOUTH INTO LUNGS EVERY 6 HOURS AS NEEDED, Disp: , Rfl:    aspirin EC 81 MG tablet, Take 1 tablet every day by oral route., Disp: , Rfl:    Blood Glucose Monitoring Suppl (ONETOUCH VERIO FLEX SYSTEM) w/Device KIT, Use as directed to check blood glucose twice daily, before breakfast and before bed., Disp: 1 kit, Rfl: 0   buPROPion (WELLBUTRIN XL) 150 MG 24 hr tablet, Take 1 tablet (150 mg total) by mouth every  morning., Disp: 30 tablet, Rfl: 2   cetirizine (ZYRTEC) 10 MG tablet, Take 10 mg by mouth at bedtime., Disp: , Rfl:    Cholecalciferol (VITAMIN D) 50 MCG (2000 UT) tablet, Take 2,000 Units by mouth daily., Disp: , Rfl:    clonazePAM (KLONOPIN) 0.5 MG tablet, Take one in the am and two at bedtime, Disp: 90 tablet, Rfl: 2   cycloSPORINE (RESTASIS) 0.05 % ophthalmic emulsion, Apply to eye., Disp: , Rfl:    estradiol (ESTRACE) 0.1 MG/GM vaginal cream, Place vaginally., Disp: , Rfl:    FLUoxetine (PROZAC) 40 MG capsule, Take 1 capsule (40 mg total) by mouth daily., Disp: 30 capsule, Rfl: 2   fluticasone (FLONASE) 50 MCG/ACT nasal spray, Place 1 spray into both nostrils daily., Disp: , Rfl:    GAMUNEX-C 20 GM/200ML SOLN, , Disp: , Rfl:    Glucos-Chond-Hyal Ac-Ca Fructo (MOVE FREE JOINT HEALTH ADVANCE PO), Take by mouth., Disp: , Rfl:    guaiFENesin (MUCINEX) 600 MG 12 hr tablet, Take by mouth., Disp: , Rfl:    insulin glargine (LANTUS) 100 UNIT/ML Solostar Pen, Inject 50 Units into the skin at bedtime., Disp: 45 mL, Rfl: 3   lamoTRIgine (LAMICTAL) 25 MG tablet, Take one tablet daily for 2 weeks,  then increase to one twice a week, Disp: 60 tablet, Rfl: 2   Lancets (ONETOUCH ULTRASOFT) lancets, 1 each by Other route as needed. , Disp: , Rfl:    lidocaine (LIDODERM) 5 %, lidocaine 5 % topical patch  APPLY 1 PATCH BY TOPICAL ROUTE ONCE DAILY (MAY WEAR UP TO 12HOURS.), Disp: , Rfl:    lubiprostone (AMITIZA) 24 MCG capsule, Take 24 mcg by mouth 2 (two) times daily., Disp: , Rfl:    Melatonin 10 MG TABS, Take 1 tablet every day by oral route at bedtime., Disp: , Rfl:    Multiple Vitamin (MULTIVITAMIN) tablet, Take 1 tablet by mouth daily., Disp: , Rfl:    Multiple Vitamins-Minerals (HAIR SKIN & NAILS PO), Take by mouth., Disp: , Rfl:    omeprazole (PRILOSEC) 20 MG capsule, Take 20 mg by mouth daily., Disp: , Rfl:    ONETOUCH VERIO test strip, 4 (four) times daily., Disp: , Rfl:    pregabalin (LYRICA) 200 MG  capsule, Take 200 mg by mouth 3 (three) times daily., Disp: , Rfl:    rosuvastatin (CRESTOR) 20 MG tablet, TAKE ONE TABLET BY MOUTH EVERYDAY AT BEDTIME, Disp: 90 tablet, Rfl: 0   sennosides-docusate sodium (SENOKOT-S) 8.6-50 MG tablet, Take 1 tablet by mouth daily. (Patient not taking: Reported on 11/22/2022), Disp: , Rfl:    tiZANidine (ZANAFLEX) 4 MG tablet, Take 4 mg by mouth daily. 47m at bedtime and 2 mg during the day, Disp: , Rfl:    traZODone (DESYREL) 50 MG tablet, Take 1 tablet (50 mg total) by mouth at bedtime., Disp: 30 tablet, Rfl: 2   Turmeric (QC TUMERIC COMPLEX PO), Take by mouth., Disp: , Rfl:    zinc gluconate 50 MG tablet, Take 50 mg by mouth daily., Disp: , Rfl:   Social History   Tobacco Use  Smoking Status Former   Types: Cigarettes   Quit date: 11/04/2010   Years since quitting: 12.1  Smokeless Tobacco Never    Allergies  Allergen Reactions   Molds & Smuts Shortness Of Breath    wheezing   Other Anaphylaxis, Shortness Of Breath and Other (See Comments)     : Angioedema wheezing Wheezing wheezing Other reaction(s): Cough Wheezing Itching and rash Wheezing wheezing Wheezing wheezing Other reaction(s): Other (See Comments)  : Angioedema wheezing Wheezing wheezing Other reaction(s): Cough Wheezing Itching and rash Wheezing wheezing Other reaction(s): Other (See Comments) Wheezing wheezing  : Angioedema wheezing Wheezing wheezing Other reaction(s): Cough Wheezing Itching and rash Wheezing wheezing Wheezing wheezing   Sulfa Antibiotics Anaphylaxis     : Angioedema    Sulfamethoxazole-Trimethoprim Anaphylaxis   Sulfonamide Derivatives Anaphylaxis     : Angioedema   Trichophyton Shortness Of Breath    wheezing wheezing wheezing wheezing wheezing wheezing wheezing wheezing wheezing   Carbamazepine Rash   Dust Mite Extract Cough and Other (See Comments)    Wheezing Other reaction(s): Wheezing Wheezing Wheezing Other  reaction(s): Wheezing Wheezing Wheezing Other reaction(s): Wheezing Wheezing   Gluten Meal Itching   Clonazepam Other (See Comments)    Confusion and memory loss Caused nconfusion   Quetiapine Fumarate Other (See Comments)   Objective:  There were no vitals filed for this visit. There is no height or weight on file to calculate BMI. Constitutional Well developed. Well nourished.  Vascular Dorsalis pedis pulses palpable bilaterally. Posterior tibial pulses palpable bilaterally. Capillary refill normal to all digits.  No cyanosis or clubbing noted. Pedal hair growth normal.  Neurologic Normal speech. Oriented to person, place, and  time. Epicritic sensation to light touch grossly present bilaterally.  Dermatologic Nails well groomed and normal in appearance. No open wounds. No skin lesions.  Orthopedic: Normal joint ROM without pain or crepitus bilaterally. No visible deformities. Tender to palpation at the calcaneal tuber bilaterally. No pain with calcaneal squeeze bilaterally. Ankle ROM diminished range of motion bilaterally. Silfverskiold Test: positive bilaterally.   Radiographs none  Assessment:   1. Pes planovalgus   2. Controlled type 2 diabetes mellitus with complication, with long-term current use of insulin ([place])   3. Plantar fasciitis of right foot   4. Plantar fasciitis of left foot    Plan:  Patient was evaluated and treated and all questions answered.  Plantar Fasciitis, bilaterally - XR reviewed as above.  - Educated on icing and stretching. Instructions given.  -Second injection delivered to the plantar fascia as below. - DME: Plantar fascial brace dispensed to support the medial longitudinal arch of the foot and offload pressure from the heel and prevent arch collapse during weightbearing - Pharmacologic management: None  Pes planovalgus -I explained to patient the etiology of pes planovalgus and relationship with Planter fasciitis and various  treatment options were discussed.  Given patient foot structure in the setting of Planter fasciitis I believe patient will benefit from custom-made orthotics to help control the hindfoot motion support the arch of the foot and take the stress away from plantar fascial.  Patient agrees with the plan like to proceed with orthotics -Patient was casted for orthotics   Procedure: Injection Tendon/Ligament Location: Bilateral plantar fascia at the glabrous junction; medial approach. Skin Prep: alcohol Injectate: 0.5 cc 0.5% marcaine plain, 0.5 cc of 1% Lidocaine, 0.5 cc kenalog 10. Disposition: Patient tolerated procedure well. Injection site dressed with a band-aid.  No follow-ups on file.  Second injection  Hammertoe diabetic shoes

## 2022-12-06 DIAGNOSIS — D801 Nonfamilial hypogammaglobulinemia: Secondary | ICD-10-CM | POA: Diagnosis not present

## 2022-12-08 DIAGNOSIS — R519 Headache, unspecified: Secondary | ICD-10-CM | POA: Diagnosis not present

## 2022-12-08 DIAGNOSIS — E119 Type 2 diabetes mellitus without complications: Secondary | ICD-10-CM | POA: Diagnosis not present

## 2022-12-08 DIAGNOSIS — H04123 Dry eye syndrome of bilateral lacrimal glands: Secondary | ICD-10-CM | POA: Diagnosis not present

## 2022-12-08 DIAGNOSIS — H401330 Pigmentary glaucoma, bilateral, stage unspecified: Secondary | ICD-10-CM | POA: Diagnosis not present

## 2022-12-08 DIAGNOSIS — H18513 Endothelial corneal dystrophy, bilateral: Secondary | ICD-10-CM | POA: Diagnosis not present

## 2022-12-12 ENCOUNTER — Ambulatory Visit: Payer: PPO | Admitting: Nurse Practitioner

## 2022-12-12 DIAGNOSIS — E1165 Type 2 diabetes mellitus with hyperglycemia: Secondary | ICD-10-CM

## 2022-12-14 DIAGNOSIS — M62838 Other muscle spasm: Secondary | ICD-10-CM | POA: Diagnosis not present

## 2022-12-14 DIAGNOSIS — M7918 Myalgia, other site: Secondary | ICD-10-CM | POA: Diagnosis not present

## 2022-12-14 DIAGNOSIS — M5412 Radiculopathy, cervical region: Secondary | ICD-10-CM | POA: Diagnosis not present

## 2022-12-14 DIAGNOSIS — M47816 Spondylosis without myelopathy or radiculopathy, lumbar region: Secondary | ICD-10-CM | POA: Diagnosis not present

## 2022-12-26 ENCOUNTER — Ambulatory Visit: Payer: PPO | Admitting: Nurse Practitioner

## 2022-12-26 DIAGNOSIS — M79641 Pain in right hand: Secondary | ICD-10-CM | POA: Diagnosis not present

## 2022-12-26 DIAGNOSIS — M359 Systemic involvement of connective tissue, unspecified: Secondary | ICD-10-CM | POA: Diagnosis not present

## 2022-12-26 DIAGNOSIS — M79642 Pain in left hand: Secondary | ICD-10-CM | POA: Diagnosis not present

## 2022-12-26 DIAGNOSIS — M2550 Pain in unspecified joint: Secondary | ICD-10-CM | POA: Diagnosis not present

## 2022-12-26 DIAGNOSIS — G894 Chronic pain syndrome: Secondary | ICD-10-CM | POA: Diagnosis not present

## 2022-12-26 DIAGNOSIS — M47816 Spondylosis without myelopathy or radiculopathy, lumbar region: Secondary | ICD-10-CM | POA: Diagnosis not present

## 2022-12-26 DIAGNOSIS — M791 Myalgia, unspecified site: Secondary | ICD-10-CM | POA: Diagnosis not present

## 2022-12-26 DIAGNOSIS — R768 Other specified abnormal immunological findings in serum: Secondary | ICD-10-CM | POA: Diagnosis not present

## 2022-12-26 DIAGNOSIS — M47812 Spondylosis without myelopathy or radiculopathy, cervical region: Secondary | ICD-10-CM | POA: Diagnosis not present

## 2022-12-27 DIAGNOSIS — M5412 Radiculopathy, cervical region: Secondary | ICD-10-CM | POA: Diagnosis not present

## 2022-12-28 DIAGNOSIS — F319 Bipolar disorder, unspecified: Secondary | ICD-10-CM | POA: Diagnosis not present

## 2023-01-02 ENCOUNTER — Ambulatory Visit (INDEPENDENT_AMBULATORY_CARE_PROVIDER_SITE_OTHER): Payer: PPO | Admitting: Nurse Practitioner

## 2023-01-02 ENCOUNTER — Encounter: Payer: Self-pay | Admitting: Nurse Practitioner

## 2023-01-02 VITALS — BP 114/74 | HR 70 | Ht 71.0 in | Wt 246.8 lb

## 2023-01-02 DIAGNOSIS — Z794 Long term (current) use of insulin: Secondary | ICD-10-CM

## 2023-01-02 DIAGNOSIS — E1165 Type 2 diabetes mellitus with hyperglycemia: Secondary | ICD-10-CM

## 2023-01-02 MED ORDER — INSULIN GLARGINE 100 UNIT/ML SOLOSTAR PEN: 50.0000 [IU] | PEN_INJECTOR | Freq: Every day | SUBCUTANEOUS | 3 refills | Status: DC

## 2023-01-02 NOTE — Progress Notes (Signed)
Endocrinology Follow Up Visit       01/02/2023, 3:38 PM   Subjective:    Patient ID: Michelle Clements, female    DOB: January 07, 1966.  Michelle Clements is being seen in follow up after being seen in consultation for management of currently uncontrolled symptomatic diabetes requested by  [name], MD.   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[place])    Depression    Essential hypertension, benign    Fibromyalgia    GERD (gastroesophageal reflux disease)    H/O sleep apnea    Hip pain, left    History of palpitations    Negative Holter monitor   History of pneumonia 1988   Hyperlipidemia    IBS (irritable bowel syndrome)    Neck pain    Poor short term memory    PTSD (post-traumatic stress disorder)    S/P Botox injection 11/2014    For migraine headaches   Type 2 diabetes mellitus ([place])    Urgency of urination     Past Surgical History:  Procedure Laterality Date   CARPAL TUNNEL RELEASE Right 06/10/2013   Procedure: CARPAL TUNNEL RELEASE;  Surgeon: Faythe Ghee, MD;  Location: [facility];  Service: Neurosurgery;  Laterality: Right;  Right Carpal Tunnel Release    CHOLECYSTECTOMY     DENTAL SURGERY  12/2014   mole exc     x 3   MUSCLE BIOPSY  2008   Right leg   TUBAL LIGATION      Social History   Socioeconomic History   Marital status: Married    Spouse name: Not on file   Number of children: 2   Years of education: College   Highest education level: Associate degree: academic program  Occupational History   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   Caffeine use: 2 cups coffee in morning and 2 cups tea during the day    Social Determinants of Health   Financial Resource Strain: Low Risk  (07/27/2021)   Overall Financial Resource Strain (CARDIA)    Difficulty of Paying Living Expenses: Not very hard  Food Insecurity: No Food Insecurity (07/27/2021)   Hunger Vital Sign    Worried About Running Out of Food in the  Last Year: Never true    [place] in the Last Year: Never true  Transportation Needs: No Transportation Needs (07/27/2021)   PRAPARE - Hydrologist (Medical): No    Lack of Transportation (Non-Medical): No  Physical Activity: Inactive (07/27/2021)   Exercise Vital Sign    Days of Exercise per Week: 0 days    Minutes of Exercise per Session: 0 min  Stress: No Stress Concern Present (07/27/2021)   [place]    Feeling of Stress : Not at all  Social Connections: Moderately Integrated (07/27/2021)   Social Connection and Isolation Panel [NHANES]    Frequency of Communication with Friends and Family: More than three times a week    Frequency of Social Gatherings with Friends and Family: More than three times a week    Attends Religious Services: More than 4 times per year    Active Member of [employer] or Organizations: No    Attends Music therapist: Never    Marital Status: Married    Family History  Problem Relation Age of Onset   Fibromyalgia Mother    Diabetes type II Mother    Depression Mother    Alzheimer's disease Mother     GER disease Mother    Heart disease Mother    Paranoid behavior Mother    Dementia Mother    Heart attack Father 89       MI x5   Stroke Father        CVA x7   Asthma Father    Brain cancer Father    Seizures Father    Anxiety disorder Sister    OCD Sister    Sexual abuse Sister    Physical abuse Sister    Anxiety disorder Sister    ADD / ADHD Daughter    ADD / ADHD Daughter    Alcohol abuse Neg Hx    Drug abuse Neg Hx    Schizophrenia Neg Hx     Outpatient Encounter Medications as of 01/02/2023  Medication Sig   albuterol (VENTOLIN HFA) 108 (90 Base) MCG/ACT inhaler albuterol sulfate HFA 90 mcg/actuation aerosol inhaler  INHALE TWO PUFFS BY MOUTH INTO LUNGS EVERY 6 HOURS AS NEEDED   aspirin EC 81 MG tablet Take 1 tablet every day by oral route.   Blood Glucose Monitoring Suppl ([place]) w/Device KIT Use as directed to check blood glucose twice daily, before breakfast and before bed.   buPROPion (WELLBUTRIN XL) 150 MG 24 hr tablet Take 1 tablet (150 mg total) by mouth every morning.   cetirizine (ZYRTEC) 10 MG tablet Take 10 mg by mouth at bedtime.   Cholecalciferol (VITAMIN D) 50 MCG (2000 UT) tablet Take 2,000 Units by mouth daily.   clonazePAM (KLONOPIN) 0.5 MG tablet Take one in the am and two at bedtime   cycloSPORINE (RESTASIS) 0.05 % ophthalmic emulsion Apply to eye.   estradiol (ESTRACE) 0.1 MG/GM vaginal cream Place vaginally.   FLUoxetine (PROZAC) 40 MG capsule Take 1 capsule (40 mg total) by mouth daily.   fluticasone (FLONASE) 50 MCG/ACT nasal spray Place 1 spray into both nostrils daily.   GAMUNEX-C 20 GM/200ML SOLN    Glucos-Chond-Hyal Ac-Ca Fructo (MOVE FREE JOINT HEALTH ADVANCE PO) Take by mouth.   guaiFENesin (MUCINEX) 600 MG 12 hr tablet Take by mouth.   insulin glargine (LANTUS) 100 UNIT/ML Solostar Pen Inject 50 Units into the skin  at bedtime.   lamoTRIgine (LAMICTAL) 25 MG tablet Take one tablet daily for 2 weeks, then increase to one  twice a week   Lancets (ONETOUCH ULTRASOFT) lancets 1 each by Other route as needed.    lidocaine (LIDODERM) 5 % lidocaine 5 % topical patch  APPLY 1 PATCH BY TOPICAL ROUTE ONCE DAILY (MAY WEAR UP TO 12HOURS.)   lubiprostone (AMITIZA) 24 MCG capsule Take 24 mcg by mouth 2 (two) times daily.   Melatonin 10 MG TABS Take 1 tablet every day by oral route at bedtime.   Multiple Vitamin (MULTIVITAMIN) tablet Take 1 tablet by mouth daily.   Multiple Vitamins-Minerals (HAIR SKIN & NAILS PO) Take by mouth.   omeprazole (PRILOSEC) 20 MG capsule Take 20 mg by mouth daily.   ONETOUCH VERIO test strip 4 (four) times daily.   pregabalin (LYRICA) 200 MG capsule Take 200 mg by mouth 3 (three) times daily.   rosuvastatin (CRESTOR) 20 MG tablet TAKE ONE TABLET BY MOUTH EVERYDAY AT BEDTIME   tiZANidine (ZANAFLEX) 4 MG tablet Take 4 mg by mouth daily. '4mg'$  at bedtime and 2 mg during the day   traZODone (DESYREL) 50 MG tablet Take 1 tablet (50 mg total) by mouth at bedtime.   Turmeric (QC TUMERIC COMPLEX PO) Take by mouth.   zinc gluconate 50 MG tablet Take 50 mg by mouth daily.   [DISCONTINUED] insulin glargine (LANTUS SOLOSTAR) 100 UNIT/ML Solostar Pen Inject 45 Units into the skin daily.   sennosides-docusate sodium (SENOKOT-S) 8.6-50 MG tablet Take 1 tablet by mouth daily. (Patient not taking: Reported on 11/22/2022)   No facility-administered encounter medications on file as of 01/02/2023.    ALLERGIES: Allergies  Allergen Reactions   Molds & Smuts Shortness Of Breath    wheezing   Other Anaphylaxis, Shortness Of Breath and Other (See Comments)     : Angioedema wheezing Wheezing wheezing Other reaction(s): Cough Wheezing Itching and rash Wheezing wheezing Wheezing wheezing Other reaction(s): Other (See Comments)  : Angioedema wheezing Wheezing wheezing Other reaction(s): Cough Wheezing Itching and rash Wheezing wheezing Other reaction(s): Other (See Comments) Wheezing wheezing  :  Angioedema wheezing Wheezing wheezing Other reaction(s): Cough Wheezing Itching and rash Wheezing wheezing Wheezing wheezing   Sulfa Antibiotics Anaphylaxis     : Angioedema    Sulfamethoxazole-Trimethoprim Anaphylaxis   Sulfonamide Derivatives Anaphylaxis     : Angioedema   Trichophyton Shortness Of Breath    wheezing wheezing wheezing wheezing wheezing wheezing wheezing wheezing wheezing   Carbamazepine Rash   Dust Mite Extract Cough and Other (See Comments)    Wheezing Other reaction(s): Wheezing Wheezing Wheezing Other reaction(s): Wheezing Wheezing Wheezing Other reaction(s): Wheezing Wheezing   Gluten Meal Itching   Clonazepam Other (See Comments)    Confusion and memory loss Caused nconfusion   Quetiapine Fumarate Other (See Comments)    VACCINATION STATUS: Immunization History  Administered Date(s) Administered   Influenza Whole 10/03/2007   Influenza-Unspecified 09/18/2014, 09/23/2015   Pneumococcal Polysaccharide-23 10/14/2012    Diabetes She presents for her follow-up diabetic visit. She has type 2 diabetes mellitus. Onset time: She was diagnosed at approximate age of 56. Her disease course has been improving. There are no hypoglycemic associated symptoms. Pertinent negatives for diabetes include no blurred vision, no fatigue, no polydipsia, no polyphagia and no polyuria. There are no hypoglycemic complications. Symptoms are stable. Diabetic complications include nephropathy and retinopathy. Risk factors for coronary artery disease include diabetes mellitus, dyslipidemia, obesity and sedentary lifestyle. Current diabetic treatment includes insulin injections (  and Mounjaro). She is compliant with treatment all of the time. Her weight is decreasing steadily. She is following a generally healthy diet. Meal planning includes avoidance of concentrated sweets. She has not had a previous visit with a dietitian. She participates in exercise intermittently. Her  home blood glucose trend is fluctuating minimally. Her breakfast blood glucose range is generally 90-110 mg/dl. Her bedtime blood glucose range is generally 180-200 mg/dl. (She presents today with her meter and logs showing at goal glycemic profile overall.  Her previsit A1c on 12/13 was 7.2%, increasing from last visit of 5.7%.  She denies any hypoglycemia.  She did overdo it during the holidays with the sweet foods and also notes she recently had steroid injection in her neck for pain which have caused her recent numbers to be higher than usual.  ) An ACE inhibitor/angiotensin II receptor blocker is not being taken. She does not see a podiatrist.Eye exam is current.  Hyperlipidemia This is a chronic problem. The current episode started more than 1 year ago. The problem is uncontrolled. Recent lipid tests were reviewed and are variable. Exacerbating diseases include chronic renal disease, diabetes and obesity. There are no known factors aggravating her hyperlipidemia. Current antihyperlipidemic treatment includes statins. The current treatment provides moderate improvement of lipids. There are no compliance problems.  Risk factors for coronary artery disease include diabetes mellitus, dyslipidemia, obesity and a sedentary lifestyle.    Review of systems  Constitutional: + increasing body weight,  current Body mass index is 34.42 kg/m. , no fatigue, no subjective hyperthermia, no subjective hypothermia Eyes: no blurry vision, no xerophthalmia ENT: no sore throat, no nodules palpated in throat, no dysphagia/odynophagia, no hoarseness Cardiovascular: no chest pain, no shortness of breath, no palpitations, no leg swelling Respiratory: no cough, no shortness of breath Gastrointestinal: no nausea/vomiting/diarrhea Musculoskeletal: no muscle/joint aches Skin: no rashes, no hyperemia Neurological: no tremors, no numbness, no tingling, no dizziness Psychiatric: no depression, no anxiety, hx bipolar d/o-  controlled on meds   Objective:    BP 114/74 (BP Location: Left Arm, Patient Position: Sitting, Cuff Size: Large)   Pulse 70   Ht '5\' 11"'$  (1.803 m)   Wt 246 lb 12.8 oz (111.9 kg)   LMP 01/04/2018   BMI 34.42 kg/m   Wt Readings from Last 3 Encounters:  01/02/23 246 lb 12.8 oz (111.9 kg)  11/22/22 243 lb 11.2 oz (110.5 kg)  08/29/22 242 lb 12.8 oz (110.1 kg)    BP Readings from Last 3 Encounters:  01/02/23 114/74  11/22/22 133/75  08/29/22 (!) 142/81     Physical Exam- Limited  Constitutional:  Body mass index is 34.42 kg/m. , not in acute distress, normal state of mind Musculoskeletal: no gross deformities, strength intact in all four extremities, no gross restriction of joint movements Skin:  no rashes, no hyperemia Neurological: no tremor with outstretched hands   CMP ( most recent) CMP     Component Value Date/Time   NA 139 12/23/2021 0000   K 5.0 12/23/2021 0000   CL 100 12/23/2021 0000   CO2 21 12/23/2021 0000   GLUCOSE 119 (H) 07/03/2019 1230   BUN 16 03/31/2022 0000   CREATININE 1.3 (A) 03/31/2022 0000   CREATININE 1.00 07/03/2019 1230   CREATININE 0.88 02/21/2012 1153   CALCIUM 10.4 03/31/2022 0000   PROT 7.0 07/03/2019 1230   ALBUMIN 4.4 12/23/2021 0000   AST 39 (A) 03/31/2022 0000   ALT 39 (A) 03/31/2022 0000   ALKPHOS 83 12/23/2021  0000   BILITOT 0.9 07/03/2019 1230   GFRNONAA 44 12/29/2020 0000   GFRAA 50 12/29/2020 0000     Diabetic Labs (most recent): Lab Results  Component Value Date   HGBA1C 5.7 (A) 08/11/2022   HGBA1C 7.4 03/31/2022   HGBA1C 6.1 01/04/2022   MICROALBUR 26 03/31/2022   MICROALBUR 150 10/22/2020     Lipid Panel ( most recent) Lipid Panel     Component Value Date/Time   CHOL 196 08/06/2021 0000   TRIG 199 (A) 03/31/2022 0000   HDL 54 08/06/2021 0000   CHOLHDL 4.2 03/18/2015 0915   VLDL 44 (H) 03/18/2015 0915   LDLCALC 126 03/31/2022 0000      Lab Results  Component Value Date   TSH 2.69 12/29/2020   TSH  1.66 09/14/2020   TSH 2.770 08/29/2019   TSH 3.261 10/14/2012   TSH 2.651 02/21/2012   TSH 1.981 03/06/2009   TSH 3.250 01/23/2008           Assessment & Plan:   1) Type 2 diabetes mellitus with hyperglycemia, with long-term current use of insulin (HCC)  - Michelle Clements has currently uncontrolled symptomatic type 2 DM since 57 years of age.  She presents today with her meter and logs showing at goal glycemic profile overall.  Her previsit A1c on 12/13 was 7.2%, increasing from last visit of 5.7%.  She denies any hypoglycemia.  She did overdo it during the holidays with the sweet foods and also notes she recently had steroid injection in her neck for pain which have caused her recent numbers to be higher than usual.     Recent labs reviewed, showing stable CKD stage 3.  - I had a long discussion with her about the progressive nature of diabetes and the pathology behind its complications. -her diabetes is complicated by sleep apnea, HLD, retinopathy and she remains at a high risk for more acute and chronic complications which include CAD, CVA, CKD, retinopathy, and neuropathy. These are all discussed in detail with her.  - Nutritional counseling repeated at each appointment due to patients tendency to fall back in to old habits.  - The patient admits there is a room for improvement in their diet and drink choices. -  Suggestion is made for the patient to avoid simple carbohydrates from their diet including Cakes, Sweet Desserts / Pastries, Ice Cream, Soda (diet and regular), Sweet Tea, Candies, Chips, Cookies, Sweet Pastries, Store Bought Juices, Alcohol in Excess of 1-2 drinks a day, Artificial Sweeteners, Coffee Creamer, and "Sugar-free" Products. This will help patient to have stable blood glucose profile and potentially avoid unintended weight gain.   - I encouraged the patient to switch to unprocessed or minimally processed complex starch and increased protein intake (animal or plant  source), fruits, and vegetables.   - Patient is advised to stick to a routine mealtimes to eat 3 meals a day and avoid unnecessary snacks (to snack only to correct hypoglycemia).  - she is following with Jearld Fenton, RDE for diabetes education.  - I have approached her with the following individualized plan to manage  her diabetes and patient agrees:   -She is advised to continue her Lantus 50 units SQ nightly.   -She is encouraged to continue monitoring blood glucose twice daily, before breakfast and before bed, and to call the clinic if she has readings less than 70 or greater than 200 for 3 tests in a row.  - Specific targets for  A1c;  LDL, HDL,  and Triglycerides were discussed with the patient.  2) Blood Pressure /Hypertension:   her blood pressure is controlled to target without the use of antihypertensive medications.  She is advised to continue Lisinopril 5 mg po daily with breakfast.   3) Lipids/Hyperlipidemia:    Her most recent lipid panel from 11/15/22 shows uncontrolled LDL of 239 and elevated triglycerides of 171.  She is advised to continue her Crestor 20 mg po daily at bedtime.  Side effects and precautions discussed with her.  4) Weight/Diet:  Her Body mass index is 34.42 kg/m.  -  clearly complicating her diabetes care.  She has lost approx 10 lbs and is advised to keep it up!  she is a candidate for weight loss. I discussed with her the fact that loss of 5 - 10% of her  current body weight will have the most impact on her diabetes management.  Exercise, and detailed carbohydrates information provided  -  detailed on discharge instructions.  5) Vitamin D deficiency Her most recent vitamin D level was 76.6 on 03/31/22.  She is currently on OTC vitamin D3 2000 units daily.  She can take a break from supplementation at this time.  6) Chronic Care/Health Maintenance: -she is on Statin medications and is encouraged to initiate and continue to follow up with Ophthalmology,  Dentist,  Podiatrist at least yearly or according to recommendations, and advised to stay away from smoking. I have recommended yearly flu vaccine and pneumonia vaccine at least every 5 years; moderate intensity exercise for up to 150 minutes weekly; and  sleep for at least 7 hours a day.  - she is advised to maintain close follow up with [name], MD for primary care needs, as well as her other providers for optimal and coordinated care.      I spent 38 minutes in the care of the patient today including review of labs from [place], Lipids, Thyroid Function, Hematology (current and previous including abstractions from other facilities); face-to-face time discussing  her blood glucose readings/logs, discussing hypoglycemia and hyperglycemia episodes and symptoms, medications doses, her options of short and long term treatment based on the latest standards of care / guidelines;  discussion about incorporating lifestyle medicine;  and documenting the encounter. Risk reduction counseling performed per USPSTF guidelines to reduce obesity and cardiovascular risk factors.     Please refer to Patient Instructions for Blood Glucose Monitoring and Insulin/Medications Dosing Guide"  in media tab for additional information. Please  also refer to " Patient Self Inventory" in the Media  tab for reviewed elements of pertinent patient history.  Michelle Clements participated in the discussions, expressed understanding, and voiced agreement with the above plans.  All questions were answered to her satisfaction. she is encouraged to contact clinic should she have any questions or concerns prior to her return visit.   Follow up plan: - Return in about 4 months (around 05/03/2023) for Diabetes F/U with A1c in office, No previsit labs, Bring meter and logs.  Rayetta Pigg, [facility] [address] [place], [place] [address] Phone: [phone] Fax: [phone]  01/02/2023, 3:38  PM

## 2023-01-04 ENCOUNTER — Telehealth: Payer: Self-pay | Admitting: Podiatry

## 2023-01-04 NOTE — Telephone Encounter (Signed)
Faxed pre authorization paperwork for orthotics.

## 2023-01-10 DIAGNOSIS — M47816 Spondylosis without myelopathy or radiculopathy, lumbar region: Secondary | ICD-10-CM | POA: Diagnosis not present

## 2023-01-11 ENCOUNTER — Other Ambulatory Visit (HOSPITAL_COMMUNITY): Payer: Self-pay | Admitting: Psychiatry

## 2023-01-11 ENCOUNTER — Ambulatory Visit (INDEPENDENT_AMBULATORY_CARE_PROVIDER_SITE_OTHER): Payer: PPO

## 2023-01-11 ENCOUNTER — Encounter: Payer: Self-pay | Admitting: Physician Assistant

## 2023-01-11 DIAGNOSIS — Q666 Other congenital valgus deformities of feet: Secondary | ICD-10-CM

## 2023-01-11 NOTE — Progress Notes (Signed)
Patient presents today to pick up custom molded foot orthotics recommended by Dr. Posey Pronto.   Orthotics were dispensed and fit was satisfactory. Reviewed instructions for break-in and wear. Written instructions given to patient.  Patient will follow up as needed.   [facility] - order # [id number]

## 2023-01-11 NOTE — Progress Notes (Signed)
I called and spoke with patient's rheumatologist (Dr. Sanjuana Letters of [facility]) to discuss the etiology of the patient's joint pains.  Per initial workup by Dr. Dossie Der, there is no evident autoimmune cause of the patient's aches and pains.  We will see how patient is doing at her next office visit, and if she is continuing to have increased arthralgia and myalgia, we will see if we can get her approved for subcutaneous IgG since this may be better tolerated.  Harriett Rush, PA-C 01/11/23 4:36 PM

## 2023-01-12 DIAGNOSIS — F319 Bipolar disorder, unspecified: Secondary | ICD-10-CM | POA: Diagnosis not present

## 2023-01-13 NOTE — Progress Notes (Signed)
I sent it on 01/04/23

## 2023-01-16 DIAGNOSIS — R768 Other specified abnormal immunological findings in serum: Secondary | ICD-10-CM | POA: Diagnosis not present

## 2023-01-16 DIAGNOSIS — M25561 Pain in right knee: Secondary | ICD-10-CM | POA: Diagnosis not present

## 2023-01-16 DIAGNOSIS — M25511 Pain in right shoulder: Secondary | ICD-10-CM | POA: Diagnosis not present

## 2023-01-16 DIAGNOSIS — M47812 Spondylosis without myelopathy or radiculopathy, cervical region: Secondary | ICD-10-CM | POA: Diagnosis not present

## 2023-01-16 DIAGNOSIS — M25512 Pain in left shoulder: Secondary | ICD-10-CM | POA: Diagnosis not present

## 2023-01-16 DIAGNOSIS — M79672 Pain in left foot: Secondary | ICD-10-CM | POA: Diagnosis not present

## 2023-01-16 DIAGNOSIS — M25562 Pain in left knee: Secondary | ICD-10-CM | POA: Diagnosis not present

## 2023-01-16 DIAGNOSIS — M2550 Pain in unspecified joint: Secondary | ICD-10-CM | POA: Diagnosis not present

## 2023-01-16 DIAGNOSIS — G894 Chronic pain syndrome: Secondary | ICD-10-CM | POA: Diagnosis not present

## 2023-01-16 DIAGNOSIS — M25552 Pain in left hip: Secondary | ICD-10-CM | POA: Diagnosis not present

## 2023-01-16 DIAGNOSIS — M359 Systemic involvement of connective tissue, unspecified: Secondary | ICD-10-CM | POA: Diagnosis not present

## 2023-01-16 DIAGNOSIS — M79671 Pain in right foot: Secondary | ICD-10-CM | POA: Diagnosis not present

## 2023-01-16 DIAGNOSIS — M791 Myalgia, unspecified site: Secondary | ICD-10-CM | POA: Diagnosis not present

## 2023-01-16 DIAGNOSIS — M25571 Pain in right ankle and joints of right foot: Secondary | ICD-10-CM | POA: Diagnosis not present

## 2023-01-16 DIAGNOSIS — M47816 Spondylosis without myelopathy or radiculopathy, lumbar region: Secondary | ICD-10-CM | POA: Diagnosis not present

## 2023-01-16 DIAGNOSIS — M25551 Pain in right hip: Secondary | ICD-10-CM | POA: Diagnosis not present

## 2023-01-16 DIAGNOSIS — M25572 Pain in left ankle and joints of left foot: Secondary | ICD-10-CM | POA: Diagnosis not present

## 2023-01-19 DIAGNOSIS — D485 Neoplasm of uncertain behavior of skin: Secondary | ICD-10-CM | POA: Diagnosis not present

## 2023-01-24 DIAGNOSIS — D801 Nonfamilial hypogammaglobulinemia: Secondary | ICD-10-CM | POA: Diagnosis not present

## 2023-01-25 DIAGNOSIS — F319 Bipolar disorder, unspecified: Secondary | ICD-10-CM | POA: Diagnosis not present

## 2023-01-26 ENCOUNTER — Encounter: Payer: Self-pay | Admitting: Nurse Practitioner

## 2023-01-26 DIAGNOSIS — R768 Other specified abnormal immunological findings in serum: Secondary | ICD-10-CM

## 2023-02-03 DIAGNOSIS — G2581 Restless legs syndrome: Secondary | ICD-10-CM | POA: Insufficient documentation

## 2023-02-03 DIAGNOSIS — M1990 Unspecified osteoarthritis, unspecified site: Secondary | ICD-10-CM | POA: Diagnosis not present

## 2023-02-03 DIAGNOSIS — R768 Other specified abnormal immunological findings in serum: Secondary | ICD-10-CM | POA: Diagnosis not present

## 2023-02-04 LAB — T3, FREE: T3, Free: 2.3 pg/mL (ref 2.0–4.4)

## 2023-02-04 LAB — TSH: TSH: 1.92 u[IU]/mL (ref 0.450–4.500)

## 2023-02-04 LAB — T4, FREE: Free T4: 1.05 ng/dL (ref 0.82–1.77)

## 2023-02-04 LAB — THYROGLOBULIN ANTIBODY: Thyroglobulin Antibody: 10.1 IU/mL — ABNORMAL HIGH (ref 0.0–0.9)

## 2023-02-04 LAB — THYROID PEROXIDASE ANTIBODY: Thyroperoxidase Ab SerPl-aCnc: 22 IU/mL (ref 0–34)

## 2023-02-06 ENCOUNTER — Encounter: Payer: Self-pay | Admitting: Nurse Practitioner

## 2023-02-07 NOTE — Telephone Encounter (Signed)
Authorization from [employer] denied . For orthotics 01/16/23

## 2023-02-08 DIAGNOSIS — F319 Bipolar disorder, unspecified: Secondary | ICD-10-CM | POA: Diagnosis not present

## 2023-02-10 ENCOUNTER — Other Ambulatory Visit (HOSPITAL_COMMUNITY): Payer: Self-pay | Admitting: Psychiatry

## 2023-02-14 DIAGNOSIS — G43909 Migraine, unspecified, not intractable, without status migrainosus: Secondary | ICD-10-CM | POA: Diagnosis not present

## 2023-02-14 DIAGNOSIS — J019 Acute sinusitis, unspecified: Secondary | ICD-10-CM | POA: Diagnosis not present

## 2023-02-14 DIAGNOSIS — G43109 Migraine with aura, not intractable, without status migrainosus: Secondary | ICD-10-CM | POA: Diagnosis not present

## 2023-02-14 DIAGNOSIS — G894 Chronic pain syndrome: Secondary | ICD-10-CM | POA: Diagnosis not present

## 2023-02-17 DIAGNOSIS — G4733 Obstructive sleep apnea (adult) (pediatric): Secondary | ICD-10-CM | POA: Diagnosis not present

## 2023-02-20 DIAGNOSIS — D801 Nonfamilial hypogammaglobulinemia: Secondary | ICD-10-CM | POA: Diagnosis not present

## 2023-02-21 ENCOUNTER — Ambulatory Visit: Payer: PPO | Admitting: Physician Assistant

## 2023-02-21 DIAGNOSIS — G4733 Obstructive sleep apnea (adult) (pediatric): Secondary | ICD-10-CM | POA: Diagnosis not present

## 2023-02-22 DIAGNOSIS — F319 Bipolar disorder, unspecified: Secondary | ICD-10-CM | POA: Diagnosis not present

## 2023-02-23 DIAGNOSIS — M6281 Muscle weakness (generalized): Secondary | ICD-10-CM | POA: Diagnosis not present

## 2023-02-23 DIAGNOSIS — Z20822 Contact with and (suspected) exposure to covid-19: Secondary | ICD-10-CM | POA: Diagnosis not present

## 2023-02-23 DIAGNOSIS — R5383 Other fatigue: Secondary | ICD-10-CM | POA: Diagnosis not present

## 2023-02-28 NOTE — Progress Notes (Unsigned)
Michelle Clements, [place] [address]   CLINIC:  Medical Oncology/Hematology  PCP:  [name], MD [address] [place] [address] [phone]   REASON FOR VISIT:  Follow-up for hypogammaglobulinemia  CURRENT THERAPY: Monthly IVIG  INTERVAL HISTORY:   Michelle Clements 57 y.o. female returns for routine follow-up of hypogammaglobulinemia.  She was last seen by Tarri Abernethy PA-C on 11/22/2022.  At today's visit, she reports feeling poorly due to chronic pain.  No recent hospitalizations, surgeries, or changes in baseline health status.  No infections or antibiotics in the past 3 months.  She continues to have frequent headaches, but denies any symptoms of meningeal irritation.  She does note some memory lapses and losing her train of thought over the past few months.  She denies any B symptoms.  She continues to report increasing generalized pain including back pain, neck pain, diffuse arthralgias, and myalgias.  She has known history of osteoarthritis, spondylosis, and fibromyalgia.  After her most recent IVIG, she reports severe fatigue and muscle weakness that lasted for 4 to 5 days.  She admits that she has not been taking her Tylenol or Benadryl premedication.  She called the [facility] pharmacist, who recommended slowing the rate of IVIG infusion.   She has 40% energy and 80% appetite. She endorses that she is maintaining a stable weight.   ASSESSMENT & PLAN:  1.  Hypogammaglobulinemia with recurrent infections - Labs from October 2015 showed IgG 566 [phone]), IgM 28 (57-237), IgA 65, IgE 12 - Evaluated by [facility] by Dr. Lloyd Huger (10/27/2014), who noted: " A review of Ms. Dark immune evaluation reveals mild hypogammaglobulinemia (not significant), a low IgM but normal specific antibody titers, normal B cell subsets panels and normal cellular function. Taken together the immune evaluation is normal. I do not believe Ms.  Few has a functional disorder of her immune system. For the hypo-IgM this may become significant if the IgM was less that 15. For this I suggest she have an annual IgG, IgM and IgA. Currently the IgM level is not likely to be significant". 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 Lorenso Courier, with first dose on 11/03/2021 - No infections or antibiotics over the past 3 months.   No B symptoms. - She reports increasing weakness and fatigue after IVIG infusions.  [facility] pharmacist recommended slowing rate of IVIG infusions. - Reports increased joint pain for the past few months.  Referred to rheumatology by her PCP. Spoke with rheumatologist (Dr. Sanjuana Letters of [facility]) on 01/11/2023. - no evident autoimmune cause of patient's aches and pains. Unclear if increased pain is related to her underlying chronic pain and fibromyalgia, or is a side effect of her IVIG. Would consider that increased arthralgia and myalgia may be related to her IVIG, therefore may benefit from subcutaneous IgG. - Most recent quantitative immunoglobulins (02/21/2023): IgA 44 (low), IgM 11 (low), IgG 747 (at goal).   - Patient receives her home IVIG infusions via [facility] (contact Mali Sandy at [phone], fax [phone], email [email or web address]) - PLAN: Discussed with patient that she may benefit from subcutaneous IgG, as it has fewer side effects than IVIG.  Patient would like to try further slowing the rate of IVIG before switching to subcutaneous. - Continue  IVIG every 28 days (via [facility]) with every 66-month clinic visit.  Recommend's lowest possible rate of IVIG infusion along plus premedication with Tylenol and Benadryl, and prehydration with 500 mL NS. - We will check monthly  quantitative immunoglobulins 1 to 2 days before each IVIG treatment.  Therapeutic goal is IgG around 600.  - Check CBC and CMP with home health lab draw in 3 months.  2.  Other history - Her past medical history is otherwise notable for fatty liver disease with history of transaminitis, GERD, fibromyalgia, type 2 diabetes mellitus, and IBS  PLAN SUMMARY: >> Monthly IVIG infusions via home health  >> Monthly lab check (quantitative immunoglobulins) via home health >> Check CBC and CMP in 3 months (home health lab draw) >> Office visit in 3 months       REVIEW OF SYSTEMS:   Review of Systems  Constitutional:  Positive for fatigue. Negative for appetite change, chills, diaphoresis, fever and unexpected weight change.  HENT:   Positive for trouble swallowing. Negative for lump/mass and nosebleeds.   Eyes:  Negative for eye problems.  Respiratory:  Positive for chest tightness, cough and wheezing (asthma). Negative for hemoptysis and shortness of breath.   Cardiovascular:  Negative for chest pain, leg swelling and palpitations.  Gastrointestinal:  Positive for constipation (IBS), diarrhea (IBS) and nausea. Negative for abdominal pain, blood in stool and vomiting.  Genitourinary:  Negative for hematuria.   Musculoskeletal:  Positive for arthralgias, back pain, myalgias and neck pain.  Skin: Negative.   Neurological:  Positive for dizziness, headaches (daily) and numbness. Negative for light-headedness.  Hematological:  Does not bruise/bleed easily.  Psychiatric/Behavioral:  Positive for depression and sleep disturbance. The patient is nervous/anxious.      PHYSICAL EXAM:  ECOG PERFORMANCE STATUS: 1 - Symptomatic but completely ambulatory  There were no vitals filed for this visit. There were no vitals filed for this visit. Physical Exam Constitutional:      Appearance: Normal appearance. She is obese.  HENT:     Head: Normocephalic and atraumatic.     Mouth/Throat:     Mouth:  Mucous membranes are moist.  Eyes:     Extraocular Movements: Extraocular movements intact.     Pupils: Pupils are equal, round, and reactive to light.  Cardiovascular:     Rate and Rhythm: Normal rate and regular rhythm.     Pulses: Normal pulses.     Heart sounds: Normal heart sounds.  Pulmonary:     Effort: Pulmonary effort is normal.     Breath sounds: No wheezing.  Abdominal:     General: Bowel sounds are normal.     Palpations: Abdomen is soft.     Tenderness: There is no abdominal tenderness.  Musculoskeletal:        General: No swelling.     Cervical back: Muscular tenderness (Area of left neck/jaw fullness posterior and inferior to left mandibular angle; tender; no discrete lymphadenopathy, nodules, or masses) present.     Right lower leg: No edema.     Left lower leg: No edema.  Lymphadenopathy:     Cervical: No cervical adenopathy.  Skin:    General: Skin is warm and dry.  Neurological:     General: No focal deficit present.     Mental Status: She is alert and oriented to person, place, and time.  Psychiatric:        Mood and Affect: Mood normal.        Behavior: Behavior  normal.    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[place])    Depression    Essential hypertension, benign    Fibromyalgia    GERD (gastroesophageal reflux disease)    H/O sleep apnea    Hip pain, left    History of palpitations    Negative Holter monitor   History of pneumonia 1988   Hyperlipidemia    IBS (irritable bowel syndrome)    Neck pain    Poor short term memory    PTSD (post-traumatic stress disorder)    S/P Botox injection 11/2014    For migraine headaches   Type 2 diabetes mellitus ([place])    Urgency of urination    Past Surgical History:   Procedure Laterality Date   CARPAL TUNNEL RELEASE Right 06/10/2013   Procedure: CARPAL TUNNEL RELEASE;  Surgeon: Faythe Ghee, MD;  Location: [facility];  Service: Neurosurgery;  Laterality: Right;  Right Carpal Tunnel Release    CHOLECYSTECTOMY     DENTAL SURGERY  12/2014   mole exc     x 3   MUSCLE BIOPSY  2008   Right leg   TUBAL LIGATION      SOCIAL HISTORY:  Social History   Socioeconomic History   Marital status: Married    Spouse name: Not on file   Number of children: 2   Years of education: College   Highest education level: Associate degree: academic program  Occupational History   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   Caffeine use: 2 cups coffee in morning and 2 cups tea during the day    Social Determinants of Health   Financial Resource Strain: Low Risk  (07/27/2021)   Overall Financial Resource Strain (CARDIA)    Difficulty of Paying Living Expenses: Not very hard  Food Insecurity: No Food Insecurity (07/27/2021)   Hunger Vital Sign    Worried About Running Out of Food in the Last Year: Never true    Ran Out of Food in the Last Year: Never true  Transportation Needs: No Transportation Needs (07/27/2021)   PRAPARE - Hydrologist (Medical): No    Lack of Transportation (Non-Medical): No  Physical Activity: Inactive (07/27/2021)   Exercise Vital Sign    Days of Exercise per Week: 0 days     Minutes of Exercise per Session: 0 min  Stress: No Stress Concern Present (07/27/2021)   [place]    Feeling of Stress : Not at all  Social Connections: Moderately Integrated (07/27/2021)   Social Connection and Isolation Panel [NHANES]    Frequency of Communication with Friends and Family: More than three times a week    Frequency of Social Gatherings with Friends and Family: More than three times a week    Attends Religious Services: More than 4 times per year    Active Member of [employer] or Organizations: No    Attends Archivist Meetings: Never    Marital Status: Married  Human resources officer Violence: Not At Risk (07/27/2021)   Humiliation, Afraid, Rape, and Kick questionnaire    Fear of Current or Ex-Partner: No    Emotionally Abused: No    Physically Abused: No    Sexually Abused: No    FAMILY HISTORY:  Family History  Problem Relation Age of Onset   Fibromyalgia Mother    Diabetes type II Mother    Depression Mother    Alzheimer's disease Mother    GER disease Mother    Heart disease Mother    Paranoid behavior Mother    Dementia Mother    Heart attack Father 63       MI x5   Stroke Father        CVA x7   Asthma Father    Brain cancer Father    Seizures Father    Anxiety disorder Sister    OCD Sister    Sexual abuse Sister    Physical abuse Sister    Anxiety disorder Sister    ADD / ADHD Daughter    ADD / ADHD Daughter    Alcohol abuse Neg Hx    Drug abuse Neg Hx    Schizophrenia Neg Hx     CURRENT MEDICATIONS:  Outpatient Encounter Medications as of 03/01/2023  Medication Sig Note   albuterol (VENTOLIN HFA) 108 (90 Base) MCG/ACT inhaler albuterol sulfate HFA 90 mcg/actuation aerosol inhaler  INHALE TWO PUFFS BY MOUTH INTO LUNGS EVERY 6 HOURS AS NEEDED    aspirin EC 81 MG tablet Take 1 tablet every day by oral route.    Blood Glucose Monitoring Suppl ([place]) w/Device  KIT Use as directed to check blood glucose twice daily, before breakfast and before bed.    buPROPion (WELLBUTRIN XL) 150 MG 24 hr tablet TAKE 1 TABLET BY MOUTH EVERY MORNING.    cetirizine (ZYRTEC) 10 MG tablet Take 10 mg by mouth at bedtime.    Cholecalciferol (VITAMIN D) 50 MCG (2000 UT) tablet Take 2,000 Units by mouth daily.    clonazePAM (KLONOPIN) 0.5 MG tablet TAKE 1 TABLET BY MOUTH EVERY MORNING AND TAKE TWO(2) TABLETS EVERY NIGHT AT BEDTIME.    cycloSPORINE (RESTASIS) 0.05 % ophthalmic emulsion Apply to eye.    estradiol (ESTRACE) 0.1 MG/GM vaginal cream Place vaginally.    FLUoxetine (PROZAC) 40 MG capsule TAKE 1 CAPSULE DAILY.    fluticasone (FLONASE) 50 MCG/ACT nasal spray Place 1 spray into both nostrils daily.    GAMUNEX-C 20 GM/200ML SOLN     Glucos-Chond-Hyal Ac-Ca Fructo (MOVE FREE JOINT HEALTH ADVANCE PO) Take by mouth.    guaiFENesin (MUCINEX) 600 MG 12 hr tablet Take by mouth.    insulin glargine (LANTUS) 100 UNIT/ML Solostar Pen Inject 50 Units into the skin at bedtime.    lamoTRIgine (LAMICTAL) 25 MG tablet TAKE (1) TABLET TWICE DAILY.    Lancets (ONETOUCH ULTRASOFT) lancets 1 each by Other route as needed.  04/25/2014: Received from: External Pharmacy   lidocaine (LIDODERM) 5 % lidocaine  5 % topical patch  APPLY 1 PATCH BY TOPICAL ROUTE ONCE DAILY (MAY WEAR UP TO 12HOURS.)    lubiprostone (AMITIZA) 24 MCG capsule Take 24 mcg by mouth 2 (two) times daily.    Melatonin 10 MG TABS Take 1 tablet every day by oral route at bedtime.    Multiple Vitamin (MULTIVITAMIN) tablet Take 1 tablet by mouth daily.    Multiple Vitamins-Minerals (HAIR SKIN & NAILS PO) Take by mouth.    omeprazole (PRILOSEC) 20 MG capsule Take 20 mg by mouth daily.    ONETOUCH VERIO test strip 4 (four) times daily.    pregabalin (LYRICA) 200 MG capsule Take 200 mg by mouth 3 (three) times daily.    rosuvastatin (CRESTOR) 20 MG tablet TAKE ONE TABLET BY MOUTH EVERYDAY AT BEDTIME    sennosides-docusate  sodium (SENOKOT-S) 8.6-50 MG tablet Take 1 tablet by mouth daily. (Patient not taking: Reported on 11/22/2022)    tiZANidine (ZANAFLEX) 4 MG tablet Take 4 mg by mouth daily. 4mg  at bedtime and 2 mg during the day 03/12/2015: Received from: External Pharmacy Received Sig:    traZODone (DESYREL) 50 MG tablet TAKE 1 TABLET BY MOUTH AT BEDTIME.    Turmeric (QC TUMERIC COMPLEX PO) Take by mouth.    zinc gluconate 50 MG tablet Take 50 mg by mouth daily.    No facility-administered encounter medications on file as of 03/01/2023.    ALLERGIES:  Allergies  Allergen Reactions   Molds & Smuts Shortness Of Breath    wheezing   Other Anaphylaxis, Shortness Of Breath and Other (See Comments)     : Angioedema wheezing Wheezing wheezing Other reaction(s): Cough Wheezing Itching and rash Wheezing wheezing Wheezing wheezing Other reaction(s): Other (See Comments)  : Angioedema wheezing Wheezing wheezing Other reaction(s): Cough Wheezing Itching and rash Wheezing wheezing Other reaction(s): Other (See Comments) Wheezing wheezing  : Angioedema wheezing Wheezing wheezing Other reaction(s): Cough Wheezing Itching and rash Wheezing wheezing Wheezing wheezing   Sulfa Antibiotics Anaphylaxis     : Angioedema    Sulfamethoxazole-Trimethoprim Anaphylaxis   Sulfonamide Derivatives Anaphylaxis     : Angioedema   Trichophyton Shortness Of Breath    wheezing wheezing wheezing wheezing wheezing wheezing wheezing wheezing wheezing   Carbamazepine Rash   Dust Mite Extract Cough and Other (See Comments)    Wheezing Other reaction(s): Wheezing Wheezing Wheezing Other reaction(s): Wheezing Wheezing Wheezing Other reaction(s): Wheezing Wheezing   Gluten Meal Itching   Clonazepam Other (See Comments)    Confusion and memory loss Caused nconfusion   Quetiapine Fumarate Other (See Comments)    LABORATORY DATA:  I have reviewed the labs as listed.  CBC    Component Value  Date/Time   WBC 9.0 07/03/2019 1230   RBC 4.64 07/03/2019 1230   HGB 14.7 07/03/2019 1230   HCT 42.8 07/03/2019 1230   PLT 288 07/03/2019 1230   MCV 92.2 07/03/2019 1230   MCH 31.7 07/03/2019 1230   MCHC 34.3 07/03/2019 1230   RDW 12.4 07/03/2019 1230   LYMPHSABS 2.2 07/03/2019 1230   MONOABS 0.5 07/03/2019 1230   EOSABS 0.3 07/03/2019 1230   BASOSABS 0.1 07/03/2019 1230      Latest Ref Rng & Units 03/31/2022   12:00 AM 12/23/2021   12:00 AM 08/06/2021   12:00 AM  CMP  BUN 4 - 21 16     20     16       Creatinine 0.5 - 1.1 1.3     1.2  1.4      Sodium 137 - 147  139     143      Potassium 3.4 - 5.3  5.0     5.0      Chloride 99 - 108  100     102      CO2 13 - 22  21     26       Calcium 8.7 - 10.7 10.4     10.1     10.8      Alkaline Phos 25 - 125  83     78      AST 13 - 35 39     43     28      ALT 7 - 35 U/L 39     39     28         This result is from an external source.    DIAGNOSTIC IMAGING:  I have independently reviewed the relevant imaging and discussed with the patient.   WRAP UP:  All questions were answered. The patient knows to call the clinic with any problems, questions or concerns.  Medical decision making: Moderate  Time spent on visit: I spent 20 minutes counseling the patient face to face. The total time spent in the appointment was 30 minutes and more than 50% was on counseling.  Harriett Rush, PA-C  03/01/23 11:33 AM

## 2023-03-01 ENCOUNTER — Other Ambulatory Visit: Payer: PPO

## 2023-03-01 ENCOUNTER — Inpatient Hospital Stay: Payer: PPO | Attending: Physician Assistant | Admitting: Physician Assistant

## 2023-03-01 VITALS — BP 122/63 | HR 74 | Temp 99.5°F | Resp 17 | Ht 71.0 in | Wt 254.7 lb

## 2023-03-01 DIAGNOSIS — Z87891 Personal history of nicotine dependence: Secondary | ICD-10-CM | POA: Diagnosis not present

## 2023-03-01 DIAGNOSIS — D801 Nonfamilial hypogammaglobulinemia: Secondary | ICD-10-CM | POA: Diagnosis not present

## 2023-03-01 NOTE — Patient Instructions (Signed)
[place] at [facility] Discharge Instructions  You were seen today by Tarri Abernethy PA-C for your immune deficiency (hypogammaglobulinemia) and recurrent infections.  - We will continue IVIG infusions once per month via your home health agency.  Make sure that they are iving your IgG at the slowest rate possible.  - Your home health agency should be checking your immunoglobulin levels before EACH of your IVIG infusions.  - At your lab check in 3 months, home health also needs to check your CBC and CMP.  If these levels are not checked by home health, we will have them added to your next appointment.  - Your headaches do not fit the typical pattern of IVIG-induced headaches.  They may be due to stress, sinus problems, or some other condition.  You have already been referred to see neurology.  I suggest that you follow-up with them regarding the cause of your headaches.  - I am not sure if your joint pain is related to your IVIG or not.  If your pain and weakness does not improve after slowing the rate of your IVIG, please let us know so that we can switch you to subcutaneous IgG instead of IV.  - Speak with your primary care provider regarding your other chronic conditions, especially your asthma and chest tightness.  MEDICATIONS: Continue IVIG every 4 weeks  FOLLOW-UP APPOINTMENT: Office visit in 3 months  ** Thank you for trusting me with your healthcare!  I strive to provide all of my patients with quality care at each visit.  If you receive a survey for this visit, I would be so grateful to you for taking the time to provide feedback.  Thank you in advance!  ~ [place]                   Dr. Derek Jack   &   Tarri Abernethy, PA-C   - - - - - - - - - - - - - - - - - -     Thank you for choosing [place] at [facility] to provide your oncology and hematology care.  To afford each patient quality time with our provider,  please arrive at least 15 minutes before your scheduled appointment time.   If you have a lab appointment with the [facility] please come in thru the Main Entrance and check in at the main information desk.  You need to re-schedule your appointment should you arrive 10 or more minutes late.  We strive to give you quality time with our providers, and arriving late affects you and other patients whose appointments are after yours.  Also, if you no show three or more times for appointments you may be dismissed from the clinic at the providers discretion.     Again, thank you for choosing [facility].  Our hope is that these requests will decrease the amount of time that you wait before being seen by our physicians.       _____________________________________________________________  Should you have questions after your visit to [facility], please contact our office at [phone] and follow the prompts.  Our office hours are 8:00 a.m. and 4:30 p.m. Monday - Friday.  Please note that voicemails left after 4:00 p.m. may not be returned until the following business day.  We are closed weekends and major holidays.  You do have access to a nurse 24-7, just call the  main number to the clinic [phone] and do not press any options, hold on the line and a nurse will answer the phone.    For prescription refill requests, have your pharmacy contact our office and allow 72 hours.    Due to Covid, you will need to wear a mask upon entering the hospital. If you do not have a mask, a mask will be given to you at the Main Entrance upon arrival. For doctor visits, patients may have 1 support person age 45 or older with them. For treatment visits, patients can not have anyone with them due to social distancing guidelines and our immunocompromised population.

## 2023-03-02 LAB — HM DIABETES EYE EXAM

## 2023-03-06 ENCOUNTER — Encounter: Payer: Self-pay | Admitting: Nurse Practitioner

## 2023-03-08 DIAGNOSIS — D839 Common variable immunodeficiency, unspecified: Secondary | ICD-10-CM | POA: Diagnosis not present

## 2023-03-08 DIAGNOSIS — G4733 Obstructive sleep apnea (adult) (pediatric): Secondary | ICD-10-CM | POA: Diagnosis not present

## 2023-03-08 DIAGNOSIS — F319 Bipolar disorder, unspecified: Secondary | ICD-10-CM | POA: Diagnosis not present

## 2023-03-14 ENCOUNTER — Other Ambulatory Visit (HOSPITAL_COMMUNITY): Payer: Self-pay | Admitting: Psychiatry

## 2023-03-14 DIAGNOSIS — G894 Chronic pain syndrome: Secondary | ICD-10-CM | POA: Diagnosis not present

## 2023-03-16 DIAGNOSIS — S2341XA Sprain of ribs, initial encounter: Secondary | ICD-10-CM | POA: Diagnosis not present

## 2023-03-16 DIAGNOSIS — M546 Pain in thoracic spine: Secondary | ICD-10-CM | POA: Diagnosis not present

## 2023-03-20 ENCOUNTER — Encounter: Payer: Self-pay | Admitting: Nurse Practitioner

## 2023-03-20 DIAGNOSIS — D801 Nonfamilial hypogammaglobulinemia: Secondary | ICD-10-CM | POA: Diagnosis not present

## 2023-03-20 MED ORDER — FREESTYLE LIBRE 3 SENSOR MISC: 3 refills | Status: DC

## 2023-03-20 MED ORDER — FREESTYLE LIBRE 3 READER DEVI: 1.0000 | Freq: Once | 0 refills | Status: DC

## 2023-03-20 MED ORDER — FREESTYLE LIBRE 3 READER DEVI: 1.0000 | Freq: Once | 0 refills | Status: AC

## 2023-03-20 NOTE — Addendum Note (Signed)
Addended by: Dani Gobble on: 03/20/2023 02:13 PM   Modules accepted: Orders

## 2023-03-22 DIAGNOSIS — E782 Mixed hyperlipidemia: Secondary | ICD-10-CM | POA: Diagnosis not present

## 2023-03-22 DIAGNOSIS — G43909 Migraine, unspecified, not intractable, without status migrainosus: Secondary | ICD-10-CM | POA: Diagnosis not present

## 2023-03-22 DIAGNOSIS — E1122 Type 2 diabetes mellitus with diabetic chronic kidney disease: Secondary | ICD-10-CM | POA: Diagnosis not present

## 2023-03-22 DIAGNOSIS — R269 Unspecified abnormalities of gait and mobility: Secondary | ICD-10-CM | POA: Insufficient documentation

## 2023-03-22 DIAGNOSIS — I129 Hypertensive chronic kidney disease with stage 1 through stage 4 chronic kidney disease, or unspecified chronic kidney disease: Secondary | ICD-10-CM | POA: Diagnosis not present

## 2023-03-22 DIAGNOSIS — J4540 Moderate persistent asthma, uncomplicated: Secondary | ICD-10-CM | POA: Diagnosis not present

## 2023-03-22 DIAGNOSIS — N1831 Chronic kidney disease, stage 3a: Secondary | ICD-10-CM | POA: Diagnosis not present

## 2023-03-22 DIAGNOSIS — M2550 Pain in unspecified joint: Secondary | ICD-10-CM | POA: Diagnosis not present

## 2023-03-22 DIAGNOSIS — M1990 Unspecified osteoarthritis, unspecified site: Secondary | ICD-10-CM | POA: Diagnosis not present

## 2023-03-22 DIAGNOSIS — G894 Chronic pain syndrome: Secondary | ICD-10-CM | POA: Diagnosis not present

## 2023-03-22 DIAGNOSIS — G43109 Migraine with aura, not intractable, without status migrainosus: Secondary | ICD-10-CM | POA: Diagnosis not present

## 2023-03-22 DIAGNOSIS — G4733 Obstructive sleep apnea (adult) (pediatric): Secondary | ICD-10-CM | POA: Diagnosis not present

## 2023-03-22 DIAGNOSIS — G2581 Restless legs syndrome: Secondary | ICD-10-CM | POA: Diagnosis not present

## 2023-03-27 ENCOUNTER — Other Ambulatory Visit (HOSPITAL_COMMUNITY): Payer: Self-pay

## 2023-03-27 ENCOUNTER — Encounter (HOSPITAL_COMMUNITY): Payer: Self-pay | Admitting: Hematology & Oncology

## 2023-03-27 NOTE — Telephone Encounter (Signed)
Patient sent me a notification that the pharmacy is waiting on a PA for her CGM.  Can you please check on this?  Thanks

## 2023-03-28 DIAGNOSIS — D801 Nonfamilial hypogammaglobulinemia: Secondary | ICD-10-CM | POA: Diagnosis not present

## 2023-03-29 NOTE — Telephone Encounter (Signed)
The patient also noted that the pharmacy said the Dexcom was sent in, not the [place], but I know I sent in for the [place] 3.  If you call back over there, can you inquire about that as well?

## 2023-03-30 ENCOUNTER — Encounter: Payer: Self-pay | Admitting: Physician Assistant

## 2023-03-30 ENCOUNTER — Telehealth: Payer: Self-pay | Admitting: Pharmacy Technician

## 2023-03-30 NOTE — Telephone Encounter (Signed)
Did a test claim a few days ago for the [place] 3 sensors and receiver. Both go through for a $0 copay. Pt was advised by the pharmacy that the [facility] was sent it. Called pharmacy to advise that the orders sent in were for the [place]. They were able to fill. Info relayed to provider.

## 2023-03-30 NOTE — Telephone Encounter (Signed)
Ok, thanks for your help. 

## 2023-04-04 DIAGNOSIS — F319 Bipolar disorder, unspecified: Secondary | ICD-10-CM | POA: Diagnosis not present

## 2023-04-11 ENCOUNTER — Other Ambulatory Visit: Payer: Self-pay | Admitting: Physician Assistant

## 2023-04-11 DIAGNOSIS — D801 Nonfamilial hypogammaglobulinemia: Secondary | ICD-10-CM

## 2023-04-11 DIAGNOSIS — D839 Common variable immunodeficiency, unspecified: Secondary | ICD-10-CM

## 2023-04-11 NOTE — Progress Notes (Unsigned)
Patient continues to have poor tolerance of IVIG as documented in last office note from 03/01/2023, as well as in patient message from 03/30/2023.  If insurance will approve it, we will try her on Xembify, since this can be split into smaller, more frequent doses that will hopefully be tolerated much better by the patient.  Orders entered for [facility] based on the following: Initial weekly dose = (1.37 x previous monthly IVIG dose) divided by 4 Patient was receiving Gamunex-C 40 g/400 mL IV via infusion pump over 2.54 hours every 4 weeks We will start her on Xembify 10 g / 15 mL solution, subcutaneous, weekly  Once insurance approval has been obtained, we will work with home health to obtain proper supplies.  Carnella Gu[place], PA-C 04/12/23 6:01 PM

## 2023-04-12 ENCOUNTER — Encounter (HOSPITAL_COMMUNITY): Payer: Self-pay | Admitting: Hematology & Oncology

## 2023-04-12 MED ORDER — XEMBIFY 10 GM/50ML ~~LOC~~ SOLN: 10.0000 g | SUBCUTANEOUS | 3 refills | Status: AC

## 2023-04-13 NOTE — Progress Notes (Signed)
Prescription for Xembify 10 grams/38ml sent to [facility] with confirmation received. Patient aware.

## 2023-04-14 ENCOUNTER — Other Ambulatory Visit (HOSPITAL_COMMUNITY): Payer: Self-pay | Admitting: Psychiatry

## 2023-04-14 MED ORDER — CLONAZEPAM 0.5 MG PO TABS: ORAL_TABLET | ORAL | 0 refills | Status: DC

## 2023-04-14 MED ORDER — BUPROPION HCL ER (XL) 150 MG PO TB24: 150.0000 mg | ORAL_TABLET | Freq: Every morning | ORAL | 0 refills | Status: DC

## 2023-04-14 MED ORDER — TRAZODONE HCL 50 MG PO TABS: 50.0000 mg | ORAL_TABLET | Freq: Every day | ORAL | 0 refills | Status: DC

## 2023-04-14 MED ORDER — FLUOXETINE HCL 40 MG PO CAPS: 40.0000 mg | ORAL_CAPSULE | Freq: Every day | ORAL | 0 refills | Status: DC

## 2023-04-18 DIAGNOSIS — M898X1 Other specified disorders of bone, shoulder: Secondary | ICD-10-CM | POA: Diagnosis not present

## 2023-04-18 DIAGNOSIS — F319 Bipolar disorder, unspecified: Secondary | ICD-10-CM | POA: Diagnosis not present

## 2023-04-19 ENCOUNTER — Telehealth (INDEPENDENT_AMBULATORY_CARE_PROVIDER_SITE_OTHER): Payer: PPO | Admitting: Psychiatry

## 2023-04-19 ENCOUNTER — Encounter (HOSPITAL_COMMUNITY): Payer: Self-pay | Admitting: Psychiatry

## 2023-04-19 DIAGNOSIS — F319 Bipolar disorder, unspecified: Secondary | ICD-10-CM | POA: Diagnosis not present

## 2023-04-19 MED ORDER — TRAZODONE HCL 50 MG PO TABS
50.0000 mg | ORAL_TABLET | Freq: Every day | ORAL | 1 refills | Status: DC
  Filled 2023-08-07 – 2023-08-10 (×3): qty 30, 30d supply, fill #0

## 2023-04-19 MED ORDER — LAMOTRIGINE 25 MG PO TABS: ORAL_TABLET | ORAL | 2 refills | Status: DC

## 2023-04-19 MED ORDER — BUPROPION HCL ER (XL) 150 MG PO TB24: 150.0000 mg | ORAL_TABLET | Freq: Every morning | ORAL | 2 refills | Status: DC

## 2023-04-19 MED ORDER — FLUOXETINE HCL 40 MG PO CAPS: 40.0000 mg | ORAL_CAPSULE | Freq: Every day | ORAL | 2 refills | Status: DC

## 2023-04-19 MED ORDER — CLONAZEPAM 0.5 MG PO TABS: ORAL_TABLET | ORAL | 2 refills | Status: DC

## 2023-04-19 NOTE — Progress Notes (Signed)
Virtual Visit via Video Note  I connected with Michelle Clements on 04/19/23 at 11:00 AM EDT by a video enabled telemedicine application and verified that I am speaking with the correct person using two identifiers.  Location: Patient: home Provider: office   I discussed the limitations of evaluation and management by telemedicine and the availability of in person appointments. The patient expressed understanding and agreed to proceed.      I discussed the assessment and treatment plan with the patient. The patient was provided an opportunity to ask questions and all were answered. The patient agreed with the plan and demonstrated an understanding of the instructions.   The patient was advised to call back or seek an in-person evaluation if the symptoms worsen or if the condition fails to improve as anticipated.  I provided 15 minutes of non-face-to-face time during this encounter.   Diannia Ruder, MD  [facility] MD/PA/NP OP Progress Note  04/19/2023 11:40 AM Michelle Clements  MRN:  [id number]  Chief Complaint:  Chief Complaint  Patient presents with   Depression   Anxiety   Follow-up   HPI: This patient is a 57 year old married white female who lives with her husband in [place].  She has 2 daughters who live out of the home.  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  The patient returns for follow-up after almost 6 months.  She states that she is doing okay in terms of her mood and anxiety and sleep.  However she is having a lot of medical issues.  She has been having a lot of falls and the reasons might be multifactorial.  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  She has osteoarthritis in one of her knees and sometimes it buckles.  She is going to be seeing neurology to get to the bottom of this.  She is generally sleeping well.  She feels like her anxiety is under good control.  Sometimes however she "spaces out" for a few minutes and  this is another reason she is going to be seeing neurology.  She states that her blood sugars are under fair control.  She denies any thoughts of self-harm or suicidal ideation Visit Diagnosis:    ICD-10-CM   1. Bipolar 1 disorder (HCC)  F31.9       Past Psychiatric History: Admission last year for overdose/suicide attempt followed by partial hospital program.  She was also admitted for detox several years ago  Past Medical History:  Past Medical History:  Diagnosis Date   Allergic rhinitis    Anemia    Arthritis    Lower back and hips   Asthma    Bipolar disorder (HCC)    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HCC)    Constipation    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HCC)    Depression    Essential hypertension, benign    Fibromyalgia    GERD (gastroesophageal reflux disease)    H/O sleep apnea    Hip pain, left    History of palpitations    Negative Holter monitor   History of pneumonia 1988   Hyperlipidemia    IBS (irritable bowel syndrome)    Neck pain    Poor short term memory    PTSD (post-traumatic stress disorder)    S/P Botox injection 11/2014    For migraine headaches   Type 2 diabetes mellitus (HCC)    Urgency of urination     Past  Surgical History:  Procedure Laterality Date   CARPAL TUNNEL RELEASE Right 06/10/2013   Procedure: CARPAL TUNNEL RELEASE;  Surgeon: Reinaldo Meeker, MD;  Location: [facility];  Service: Neurosurgery;  Laterality: Right;  Right Carpal Tunnel Release    CHOLECYSTECTOMY     DENTAL SURGERY  12/2014   mole exc     x 3   MUSCLE BIOPSY  2008   Right leg   TUBAL LIGATION      Family Psychiatric History: See below  Family History:  Family History  Problem Relation Age of Onset   Fibromyalgia Mother    Diabetes type II Mother    Depression Mother    Alzheimer's disease Mother    GER disease Mother    Heart disease Mother    Paranoid behavior Mother    Dementia Mother    Heart attack Father 2       MI x5   Stroke Father         CVA x7   Asthma Father    Brain cancer Father    Seizures Father    Anxiety disorder Sister    OCD Sister    Sexual abuse Sister    Physical abuse Sister    Anxiety disorder Sister    ADD / ADHD Daughter    ADD / ADHD Daughter    Alcohol abuse Neg Hx    Drug abuse Neg Hx    Schizophrenia Neg Hx     Social History:  Social History   Socioeconomic History   Marital status: Married    Spouse name: Not on file   Number of children: 2   Years of education: College   Highest education level: Associate degree: academic program  Occupational History   Occupation: Chiropractor: UNEMPLOYED    Comment: Now disabled due to bipolar and fibromyalgia  Tobacco Use   Smoking status: Former    Types: Cigarettes    Quit date: 11/04/2010    Years since quitting: 12.4   Smokeless tobacco: Never  Vaping Use   Vaping Use: Never used  Substance and Sexual Activity   Alcohol use: Yes    Comment: one mixed drink per month   Drug use: No   Sexual activity: Yes    Birth control/protection: Surgical  Other Topics Concern   Not on file  Social History Narrative   Married   Lives with spouse and daughter   Right handed.   Caffeine use: 2 cups coffee in morning and 2 cups tea during the day    Social Determinants of Health   Financial Resource Strain: Low Risk  (07/27/2021)   Overall Financial Resource Strain (CARDIA)    Difficulty of Paying Living Expenses: Not very hard  Food Insecurity: No Food Insecurity (07/27/2021)   Hunger Vital Sign    Worried About Running Out of Food in the Last Year: Never true    Ran Out of Food in the Last Year: Never true  Transportation Needs: No Transportation Needs (07/27/2021)   PRAPARE - Administrator, Civil Service (Medical): No    Lack of Transportation (Non-Medical): No  Physical Activity: Inactive (07/27/2021)   Exercise Vital Sign    Days of Exercise per Week: 0 days    Minutes of Exercise per Session: 0 min  Stress:  No Stress Concern Present (07/27/2021)   [employer] of Occupational Health - Occupational Stress Questionnaire    Feeling of Stress : Not at all  Social Connections: Moderately Integrated (07/27/2021)   Social Connection and Isolation Panel [NHANES]    Frequency of Communication with Friends and Family: More than three times a week    Frequency of Social Gatherings with Friends and Family: More than three times a week    Attends Religious Services: More than 4 times per year    Active Member of [employer] or Organizations: No    Attends Banker Meetings: Never    Marital Status: Married    Allergies:  Allergies  Allergen Reactions   Molds & Smuts Shortness Of Breath    wheezing   Other Anaphylaxis, Shortness Of Breath and Other (See Comments)     : Angioedema wheezing Wheezing wheezing Other reaction(s): Cough Wheezing Itching and rash Wheezing wheezing Wheezing wheezing Other reaction(s): Other (See Comments)  : Angioedema wheezing Wheezing wheezing Other reaction(s): Cough Wheezing Itching and rash Wheezing wheezing Other reaction(s): Other (See Comments) Wheezing wheezing  : Angioedema wheezing Wheezing wheezing Other reaction(s): Cough Wheezing Itching and rash Wheezing wheezing Wheezing wheezing   Sulfa Antibiotics Anaphylaxis     : Angioedema    Sulfamethoxazole-Trimethoprim Anaphylaxis   Sulfonamide Derivatives Anaphylaxis     : Angioedema   Trichophyton Shortness Of Breath    wheezing wheezing wheezing wheezing wheezing wheezing wheezing wheezing wheezing   Carbamazepine Rash   Dust Mite Extract Cough and Other (See Comments)    Wheezing Other reaction(s): Wheezing Wheezing Wheezing Other reaction(s): Wheezing Wheezing Wheezing Other reaction(s): Wheezing Wheezing   Gluten Meal Itching   Clonazepam Other (See Comments)    Confusion and memory loss Caused nconfusion   Quetiapine Fumarate Other (See Comments)     Metabolic Disorder Labs: Lab Results  Component Value Date   HGBA1C 5.7 (A) 08/11/2022   MPG 131 03/18/2015   No results found for: "PROLACTIN" Lab Results  Component Value Date   CHOL 196 08/06/2021   TRIG 199 (A) 03/31/2022   HDL 54 08/06/2021   CHOLHDL 4.2 03/18/2015   VLDL 44 (H) 03/18/2015   LDLCALC 126 03/31/2022   LDLCALC 117 08/06/2021   Lab Results  Component Value Date   TSH 1.920 02/03/2023   TSH 2.69 12/29/2020    Therapeutic Level Labs: Lab Results  Component Value Date   LITHIUM 0.20 (L) 02/21/2012   No results found for: "VALPROATE" No results found for: "CBMZ"  Current Medications: Current Outpatient Medications  Medication Sig Dispense Refill   albuterol (VENTOLIN HFA) 108 (90 Base) MCG/ACT inhaler albuterol sulfate HFA 90 mcg/actuation aerosol inhaler  INHALE TWO PUFFS BY MOUTH INTO LUNGS EVERY 6 HOURS AS NEEDED     aspirin EC 81 MG tablet Take 1 tablet every day by oral route.     Blood Glucose Monitoring Suppl (ONETOUCH VERIO FLEX SYSTEM) w/Device KIT Use as directed to check blood glucose twice daily, before breakfast and before bed. 1 kit 0   buPROPion (WELLBUTRIN XL) 150 MG 24 hr tablet Take 1 tablet (150 mg total) by mouth every morning. 30 tablet 2   cetirizine (ZYRTEC) 10 MG tablet Take 10 mg by mouth at bedtime.     Cholecalciferol (VITAMIN D) 50 MCG (2000 UT) tablet Take 2,000 Units by mouth daily.     clonazePAM (KLONOPIN) 0.5 MG tablet TAKE 1 TABLET BY MOUTH EVERY MORNING AND TAKE TWO(2) TABLETS EVERY NIGHT AT BEDTIME. 90 tablet 2   Continuous Glucose Sensor (FREESTYLE LIBRE 3 SENSOR) MISC Change sensor every 14 days 6 each 3   cycloSPORINE (RESTASIS) 0.05 %  ophthalmic emulsion Apply to eye.     estradiol (ESTRACE) 0.1 MG/GM vaginal cream Place vaginally.     FLUoxetine (PROZAC) 40 MG capsule Take 1 capsule (40 mg total) by mouth daily. 30 capsule 2   fluticasone (FLONASE) 50 MCG/ACT nasal spray Place 1 spray into both nostrils daily.      GAMUNEX-C 20 GM/200ML SOLN      Glucos-Chond-Hyal Ac-Ca Fructo (MOVE FREE JOINT HEALTH ADVANCE PO) Take by mouth.     Immune Globulin, Human,-klhw (XEMBIFY) 10 GM/50ML SOLN Inject 10 g into the skin every 7 (seven) days. 200 mL 3   insulin glargine (LANTUS) 100 UNIT/ML Solostar Pen Inject 50 Units into the skin at bedtime. 45 mL 3   lamoTRIgine (LAMICTAL) 25 MG tablet TAKE (1) TABLET TWICE DAILY. 60 tablet 2   Lancets (ONETOUCH ULTRASOFT) lancets 1 each by Other route as needed.      lidocaine (LIDODERM) 5 % lidocaine 5 % topical patch  APPLY 1 PATCH BY TOPICAL ROUTE ONCE DAILY (MAY WEAR UP TO 12HOURS.)     lubiprostone (AMITIZA) 24 MCG capsule Take 24 mcg by mouth 2 (two) times daily.     Melatonin 10 MG TABS Take 1 tablet every day by oral route at bedtime.     Multiple Vitamin (MULTIVITAMIN) tablet Take 1 tablet by mouth daily.     omeprazole (PRILOSEC) 20 MG capsule Take 20 mg by mouth daily.     ONETOUCH VERIO test strip 4 (four) times daily.     pregabalin (LYRICA) 200 MG capsule Take 200 mg by mouth 3 (three) times daily.     rosuvastatin (CRESTOR) 20 MG tablet TAKE ONE TABLET BY MOUTH EVERYDAY AT BEDTIME 90 tablet 0   sennosides-docusate sodium (SENOKOT-S) 8.6-50 MG tablet Take 1 tablet by mouth daily.     tiZANidine (ZANAFLEX) 4 MG tablet Take 4 mg by mouth daily. 4mg  at bedtime and 2 mg during the day     traZODone (DESYREL) 50 MG tablet Take 1 tablet (50 mg total) by mouth at bedtime. 30 tablet 2   Turmeric (QC TUMERIC COMPLEX PO) Take by mouth.     zinc gluconate 50 MG tablet Take 50 mg by mouth daily.     No current facility-administered medications for this visit.     Musculoskeletal: Strength & Muscle Tone: within normal limits Gait & Station: unsteady Patient leans: N/A  Psychiatric Specialty Exam: Review of Systems  Constitutional:  Positive for fatigue.  Musculoskeletal:  Positive for arthralgias, back pain, gait problem and neck pain.  All other systems  reviewed and are negative.   Last menstrual period 01/04/2018.There is no height or weight on file to calculate BMI.  General Appearance: Casual and Fairly Groomed  Eye Contact:  Good  Speech:  Clear and Coherent  Volume:  Normal  Mood:  Euthymic  Affect:  Congruent  Thought Process:  Goal Directed  Orientation:  Full (Time, Place, and Person)  Thought Content: WDL   Suicidal Thoughts:  No  Homicidal Thoughts:  No  Memory:  Immediate;   Good Recent;   Good Remote;   NA  Judgement:  Good  Insight:  Fair  Psychomotor Activity:  Normal  Concentration:  Concentration: Fair and Attention Span: Fair  Recall:  Good  Fund of Knowledge: Good  Language: Good  Akathisia:  No  Handed:  Right  AIMS (if indicated): not done  Assets:  Communication Skills Desire for Improvement Resilience Social Support Talents/Skills  ADL's:  Intact  Cognition:  WNL  Sleep:  Good   Screenings: Mini-Mental    Flowsheet Row Office Visit from 08/29/2019 in [facility]  Total Score (max 30 points ) 27      PHQ2-9    Flowsheet Row Video Visit from 11/04/2022 in [facility] at [place] Video Visit from 08/23/2022 in [facility] at [place] Video Visit from 06/20/2022 in [facility] at [place] Video Visit from 05/20/2022 in [facility] at [place] Video Visit from 03/16/2022 in [facility] at [facility] Total Score 2 0 0 2 0  PHQ-9 Total Score 10 -- -- 7 0      Flowsheet Row Video Visit from 11/04/2022 in [facility] at [place] Video Visit from 08/23/2022 in [facility] at [place] Video Visit from 06/20/2022 in [facility] at [name] RISK CATEGORY Error: Question 6 not populated No Risk No Risk        Assessment  and Plan: This patient is a 57 year old female with a history of PTSD possible bipolar disorder depression anxiety and hallucinations.  In terms of her mental status she seems to be doing well.  She will continue Wellbutrin XL 150 mg daily as well as Prozac 40 mg daily for depression, trazodone 25 to 50 mg at bedtime for sleep, clonazepam 0.5 mg in the morning and 1 mg at bedtime for anxiety and Lamictal 25 mg twice daily for mood swings.  She will return to see me in 3 months  Collaboration of Care: Collaboration of Care: Primary Care Provider AEB notes will be shared with PCP at patient's request  Patient/Guardian was advised Release of Information must be obtained prior to any record release in order to collaborate their care with an outside provider. Patient/Guardian was advised if they have not already done so to contact the registration department to sign all necessary forms in order for [place] to release information regarding their care.   Consent: Patient/Guardian gives verbal consent for treatment and assignment of benefits for services provided during this visit. Patient/Guardian expressed understanding and agreed to proceed.    Diannia Ruder, MD 04/19/2023, 11:40 AM

## 2023-04-20 DIAGNOSIS — M5412 Radiculopathy, cervical region: Secondary | ICD-10-CM | POA: Diagnosis not present

## 2023-04-25 DIAGNOSIS — D801 Nonfamilial hypogammaglobulinemia: Secondary | ICD-10-CM | POA: Diagnosis not present

## 2023-04-26 ENCOUNTER — Encounter (HOSPITAL_COMMUNITY): Payer: Self-pay | Admitting: Hematology & Oncology

## 2023-04-26 DIAGNOSIS — F319 Bipolar disorder, unspecified: Secondary | ICD-10-CM | POA: Diagnosis not present

## 2023-04-28 ENCOUNTER — Other Ambulatory Visit (HOSPITAL_COMMUNITY): Payer: Self-pay

## 2023-05-03 ENCOUNTER — Ambulatory Visit: Payer: PPO | Admitting: Nurse Practitioner

## 2023-05-08 ENCOUNTER — Encounter: Payer: Self-pay | Admitting: *Deleted

## 2023-05-08 NOTE — Progress Notes (Signed)
Received approval from plan for Xembify 10gm/50 ML on 05/04/23.

## 2023-05-10 DIAGNOSIS — F319 Bipolar disorder, unspecified: Secondary | ICD-10-CM | POA: Diagnosis not present

## 2023-05-11 ENCOUNTER — Ambulatory Visit: Payer: PPO | Admitting: Nurse Practitioner

## 2023-05-11 DIAGNOSIS — E1165 Type 2 diabetes mellitus with hyperglycemia: Secondary | ICD-10-CM

## 2023-05-12 DIAGNOSIS — D801 Nonfamilial hypogammaglobulinemia: Secondary | ICD-10-CM | POA: Diagnosis not present

## 2023-05-19 DIAGNOSIS — D801 Nonfamilial hypogammaglobulinemia: Secondary | ICD-10-CM | POA: Diagnosis not present

## 2023-05-20 DIAGNOSIS — R300 Dysuria: Secondary | ICD-10-CM | POA: Diagnosis not present

## 2023-05-20 DIAGNOSIS — R350 Frequency of micturition: Secondary | ICD-10-CM | POA: Diagnosis not present

## 2023-05-23 DIAGNOSIS — D801 Nonfamilial hypogammaglobulinemia: Secondary | ICD-10-CM | POA: Diagnosis not present

## 2023-05-24 DIAGNOSIS — F319 Bipolar disorder, unspecified: Secondary | ICD-10-CM | POA: Diagnosis not present

## 2023-05-25 DIAGNOSIS — Z794 Long term (current) use of insulin: Secondary | ICD-10-CM | POA: Diagnosis not present

## 2023-05-25 DIAGNOSIS — N183 Chronic kidney disease, stage 3 unspecified: Secondary | ICD-10-CM | POA: Diagnosis not present

## 2023-05-25 DIAGNOSIS — F319 Bipolar disorder, unspecified: Secondary | ICD-10-CM | POA: Diagnosis not present

## 2023-05-25 DIAGNOSIS — E1122 Type 2 diabetes mellitus with diabetic chronic kidney disease: Secondary | ICD-10-CM | POA: Diagnosis not present

## 2023-05-26 DIAGNOSIS — D801 Nonfamilial hypogammaglobulinemia: Secondary | ICD-10-CM | POA: Diagnosis not present

## 2023-05-29 ENCOUNTER — Encounter: Payer: Self-pay | Admitting: Nurse Practitioner

## 2023-05-30 ENCOUNTER — Encounter: Payer: Self-pay | Admitting: Physician Assistant

## 2023-05-30 DIAGNOSIS — D801 Nonfamilial hypogammaglobulinemia: Secondary | ICD-10-CM | POA: Diagnosis not present

## 2023-05-31 ENCOUNTER — Inpatient Hospital Stay: Payer: PPO | Admitting: Physician Assistant

## 2023-06-02 DIAGNOSIS — D801 Nonfamilial hypogammaglobulinemia: Secondary | ICD-10-CM | POA: Diagnosis not present

## 2023-06-06 DIAGNOSIS — D801 Nonfamilial hypogammaglobulinemia: Secondary | ICD-10-CM | POA: Diagnosis not present

## 2023-06-20 ENCOUNTER — Encounter: Payer: Self-pay | Admitting: Physician Assistant

## 2023-06-20 ENCOUNTER — Other Ambulatory Visit: Payer: Self-pay | Admitting: Physician Assistant

## 2023-06-20 DIAGNOSIS — D801 Nonfamilial hypogammaglobulinemia: Secondary | ICD-10-CM

## 2023-06-20 NOTE — Progress Notes (Unsigned)
[name] [address], [place] [address]   CLINIC:  Medical Oncology/Hematology  PCP:  Benita Stabile, MD [address] [place] [address] [phone]   REASON FOR VISIT:  Follow-up for hypogammaglobulinemia  PRIOR THERAPY: Monthly IVIG  CURRENT THERAPY: Subcutaneous IgG Richardean Sale )  INTERVAL HISTORY:   [name] 57 y.o. female returns for routine follow-up of hypogammaglobulinemia.  She was last seen by Rojelio Brenner PA-C on 03/01/2023.    In the interim since her last visit, she was switched from IVIG to SCIG due to side effects of nausea, fatigue, generalized weakness following infusions, arthralgias, and myalgias.  She had her first dose of Xembify in June 2024, which she tolerated fairly well.  She reports having side effects of fatigue, body aches, and headaches for 24 to 48 hours after each infusion.  She also reports diarrhea that occurs on the second day after each infusion.  She has some mild redness at the site of each injection, but this resolves within 1 to 2 days.  Overall, she feels that the symptoms are less severe than they were when she was taking IVIG.  She reports excellent hydration status.  She has ongoing joint pain that she attributes to her back and neck issues and fibromyalgia.  She has not had the severe weakness that she was experiencing after her IVIG.  Overall, she feels that her quality of life is improved after switching to subcutaneous IgG.  No infections or antibiotics in the past 3 months.  She denies any B symptoms.  She has 40% energy and 100% appetite. She endorses that she is maintaining a stable weight.  ASSESSMENT & PLAN:  1.  Hypogammaglobulinemia with recurrent infections - Labs from October 2015 showed IgG 566 [phone]), IgM 28 (57-237), IgA 65, IgE 12 - Evaluated by [facility] by Dr. Ellie Lunch (10/27/2014), who noted: " A review of Ms. Shropshire immune evaluation reveals mild hypogammaglobulinemia  (not significant), a low IgM but normal specific antibody titers, normal B cell subsets panels and normal cellular function. Taken together the immune evaluation is normal. I do not believe Ms. Widmann has a functional disorder of her immune system. For the hypo-IgM this may become significant if the IgM was less that 15. For this I suggest she have an annual IgG, IgM and IgA. Currently the IgM level is not likely to be significant". 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 Leonides Schanz, with first dose on 11/03/2021 - No infections or antibiotics over the past 3 months.  No B symptoms. - She reported increasing weakness, fatigue, nausea, and myalgia/arthralgia after IVIG infusions (Gamunex-C 40 g/400 mL q 4 weeks). - Switched to subcutaneous IgG (Xembify 10g/15 mL SQ weekly) in June 2024, which she is tolerating better, although she does continue to have side effects of fatigue, headache, and diarrhea - Patient receives her home IVIG infusions via [facility] (contact [place] Sandy at [phone], fax [phone], email [email or web address]) - PLAN: Will check labs next week (1 day before infusion) with ABC/D, CMP, immunoglobulins - Therapeutic goal is IgG around 600. - We will check quantitative immunoglobulins, CBC/D, and CMP in 3 months (the day prior to infusion) - OFFICE visit in 3 months - Educational information regarding common side effects and risks has been provided to patient.  We discussed the importance of hydration due to risk of kidney injury.  2.  Other history - Her past medical history is otherwise notable for fatty liver disease with history of transaminitis, GERD, fibromyalgia, type 2 diabetes mellitus, and IBS  PLAN SUMMARY: >> Labs to be obtained at [facility] (in 1 week, then again in 3  months) = CBC/D, CMP, immunoglobulins >> OFFICE visit in 3 months      REVIEW OF SYSTEMS:   Review of Systems  Constitutional:  Positive for fatigue. Negative for appetite change, chills, diaphoresis, fever and unexpected weight change.  HENT:   Positive for trouble swallowing. Negative for lump/mass and nosebleeds.   Eyes:  Negative for eye problems.  Respiratory:  Negative for chest tightness, cough, hemoptysis, shortness of breath and wheezing (asthma).   Cardiovascular:  Negative for chest pain, leg swelling and palpitations.  Gastrointestinal:  Positive for diarrhea (IBS) and nausea. Negative for abdominal pain, blood in stool, constipation (IBS) and vomiting.  Genitourinary:  Negative for hematuria.   Musculoskeletal:  Positive for arthralgias, back pain, myalgias and neck pain.  Skin: Negative.   Neurological:  Positive for dizziness, headaches (daily) and numbness. Negative for light-headedness.  Hematological:  Does not bruise/bleed easily.  Psychiatric/Behavioral:  Negative for depression and sleep disturbance. The patient is not nervous/anxious.      PHYSICAL EXAM:  ECOG PERFORMANCE STATUS: 1 - Symptomatic but completely ambulatory  There were no vitals filed for this visit. There were no vitals filed for this visit. Physical Exam Constitutional:      Appearance: Normal appearance. She is obese.  HENT:     Head: Normocephalic and atraumatic.     Mouth/Throat:     Mouth: Mucous membranes are moist.  Eyes:     Extraocular Movements: Extraocular movements intact.     Pupils: Pupils are equal, round, and reactive to light.  Cardiovascular:     Rate and Rhythm: Normal rate and regular rhythm.     Pulses: Normal pulses.     Heart sounds: Normal heart sounds.  Pulmonary:     Effort: Pulmonary effort is normal.     Breath sounds: No wheezing.  Abdominal:     General: Bowel sounds are normal.     Palpations: Abdomen is soft.     Tenderness: There is no abdominal  tenderness.  Musculoskeletal:        General: No swelling.     Cervical back: Muscular tenderness (Area of left neck/jaw fullness posterior and inferior to left mandibular angle; tender; no discrete lymphadenopathy, nodules, or masses) present.     Right lower leg: No edema.     Left lower leg: No edema.  Lymphadenopathy:     Cervical: No cervical adenopathy.  Skin:    General: Skin is warm and dry.  Neurological:     General: No focal deficit present.     Mental Status: She is alert and oriented to person, place, and time.  Psychiatric:        Mood and Affect: Mood normal.        Behavior: Behavior normal.    PAST MEDICAL/SURGICAL HISTORY:  Past Medical History:  Diagnosis Date   Allergic rhinitis    Anemia    Arthritis    Lower back and hips   Asthma    Bipolar disorder (HCC)    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HCC)    Constipation    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HCC)    Depression    Essential hypertension, benign  Fibromyalgia    GERD (gastroesophageal reflux disease)    H/O sleep apnea    Hip pain, left    History of palpitations    Negative Holter monitor   History of pneumonia 1988   Hyperlipidemia    IBS (irritable bowel syndrome)    Neck pain    Poor short term memory    PTSD (post-traumatic stress disorder)    S/P Botox injection 11/2014    For migraine headaches   Type 2 diabetes mellitus (HCC)    Urgency of urination    Past Surgical History:  Procedure Laterality Date   CARPAL TUNNEL RELEASE Right 06/10/2013   Procedure: CARPAL TUNNEL RELEASE;  Surgeon: Reinaldo Meeker, MD;  Location: [facility];  Service: Neurosurgery;  Laterality: Right;  Right Carpal Tunnel Release    CHOLECYSTECTOMY     DENTAL SURGERY  12/2014   mole exc     x 3   MUSCLE BIOPSY  2008   Right leg   TUBAL LIGATION      SOCIAL HISTORY:  Social History   Socioeconomic History   Marital status: Married    Spouse name: Not on file   Number of children: 2    Years of education: College   Highest education level: Associate degree: academic program  Occupational History   Occupation: Chiropractor: UNEMPLOYED    Comment: Now disabled due to bipolar and fibromyalgia  Tobacco Use   Smoking status: Former    Current packs/day: 0.00    Types: Cigarettes    Quit date: 11/04/2010    Years since quitting: 12.6   Smokeless tobacco: Never  Vaping Use   Vaping status: Never Used  Substance and Sexual Activity   Alcohol use: Yes    Comment: one mixed drink per month   Drug use: No   Sexual activity: Yes    Birth control/protection: Surgical  Other Topics Concern   Not on file  Social History Narrative   Married   Lives with spouse and daughter   Right handed.   Caffeine use: 2 cups coffee in morning and 2 cups tea during the day    Social Determinants of Health   Financial Resource Strain: Low Risk  (07/27/2021)   Overall Financial Resource Strain (CARDIA)    Difficulty of Paying Living Expenses: Not very hard  Food Insecurity: No Food Insecurity (07/27/2021)   Hunger Vital Sign    Worried About Running Out of Food in the Last Year: Never true    Ran Out of Food in the Last Year: Never true  Transportation Needs: No Transportation Needs (07/27/2021)   PRAPARE - Administrator, Civil Service (Medical): No    Lack of Transportation (Non-Medical): No  Physical Activity: Inactive (07/27/2021)   Exercise Vital Sign    Days of Exercise per Week: 0 days    Minutes of Exercise per Session: 0 min  Stress: No Stress Concern Present (07/27/2021)   [employer] of Occupational Health - Occupational Stress Questionnaire    Feeling of Stress : Not at all  Social Connections: Unknown (04/18/2022)   Received from [facility]   Social Network    Social Network: Not on file  Intimate Partner Violence: Unknown (03/10/2022)   Received from [facility]   HITS    Physically Hurt: Not on file    Insult or Talk Down To: Not on  file    Threaten Physical Harm: Not on file  Scream or Curse: Not on file    FAMILY HISTORY:  Family History  Problem Relation Age of Onset   Fibromyalgia Mother    Diabetes type II Mother    Depression Mother    Alzheimer's disease Mother    GER disease Mother    Heart disease Mother    Paranoid behavior Mother    Dementia Mother    Heart attack Father 33       MI x5   Stroke Father        CVA x7   Asthma Father    Brain cancer Father    Seizures Father    Anxiety disorder Sister    OCD Sister    Sexual abuse Sister    Physical abuse Sister    Anxiety disorder Sister    ADD / ADHD Daughter    ADD / ADHD Daughter    Alcohol abuse Neg Hx    Drug abuse Neg Hx    Schizophrenia Neg Hx     CURRENT MEDICATIONS:  Outpatient Encounter Medications as of 06/21/2023  Medication Sig Note   albuterol (VENTOLIN HFA) 108 (90 Base) MCG/ACT inhaler albuterol sulfate HFA 90 mcg/actuation aerosol inhaler  INHALE TWO PUFFS BY MOUTH INTO LUNGS EVERY 6 HOURS AS NEEDED    aspirin EC 81 MG tablet Take 1 tablet every day by oral route.    Blood Glucose Monitoring Suppl (ONETOUCH VERIO FLEX SYSTEM) w/Device KIT Use as directed to check blood glucose twice daily, before breakfast and before bed.    buPROPion (WELLBUTRIN XL) 150 MG 24 hr tablet Take 1 tablet (150 mg total) by mouth every morning.    cetirizine (ZYRTEC) 10 MG tablet Take 10 mg by mouth at bedtime.    Cholecalciferol (VITAMIN D) 50 MCG (2000 UT) tablet Take 2,000 Units by mouth daily.    clonazePAM (KLONOPIN) 0.5 MG tablet TAKE 1 TABLET BY MOUTH EVERY MORNING AND TAKE TWO(2) TABLETS EVERY NIGHT AT BEDTIME.    Continuous Glucose Sensor (FREESTYLE LIBRE 3 SENSOR) MISC Change sensor every 14 days    cycloSPORINE (RESTASIS) 0.05 % ophthalmic emulsion Apply to eye.    estradiol (ESTRACE) 0.1 MG/GM vaginal cream Place vaginally.    FLUoxetine (PROZAC) 40 MG capsule Take 1 capsule (40 mg total) by mouth daily.    fluticasone (FLONASE)  50 MCG/ACT nasal spray Place 1 spray into both nostrils daily.    GAMUNEX-C 20 GM/200ML SOLN     Glucos-Chond-Hyal Ac-Ca Fructo (MOVE FREE JOINT HEALTH ADVANCE PO) Take by mouth.    Immune Globulin, Human,-klhw (XEMBIFY) 10 GM/50ML SOLN Inject 10 g into the skin every 7 (seven) days.    insulin glargine (LANTUS) 100 UNIT/ML Solostar Pen Inject 50 Units into the skin at bedtime.    lamoTRIgine (LAMICTAL) 25 MG tablet TAKE (1) TABLET TWICE DAILY.    Lancets (ONETOUCH ULTRASOFT) lancets 1 each by Other route as needed.  04/25/2014: Received from: External Pharmacy   lidocaine (LIDODERM) 5 % lidocaine 5 % topical patch  APPLY 1 PATCH BY TOPICAL ROUTE ONCE DAILY (MAY WEAR UP TO 12HOURS.)    lubiprostone (AMITIZA) 24 MCG capsule Take 24 mcg by mouth 2 (two) times daily.    Melatonin 10 MG TABS Take 1 tablet every day by oral route at bedtime.    Multiple Vitamin (MULTIVITAMIN) tablet Take 1 tablet by mouth daily.    omeprazole (PRILOSEC) 20 MG capsule Take 20 mg by mouth daily.    ONETOUCH VERIO test strip 4 (four) times  daily.    pregabalin (LYRICA) 200 MG capsule Take 200 mg by mouth 3 (three) times daily.    rosuvastatin (CRESTOR) 20 MG tablet TAKE ONE TABLET BY MOUTH EVERYDAY AT BEDTIME    sennosides-docusate sodium (SENOKOT-S) 8.6-50 MG tablet Take 1 tablet by mouth daily.    tiZANidine (ZANAFLEX) 4 MG tablet Take 4 mg by mouth daily. 4mg  at bedtime and 2 mg during the day 03/12/2015: Received from: External Pharmacy Received Sig:    traZODone (DESYREL) 50 MG tablet Take 1 tablet (50 mg total) by mouth at bedtime.    Turmeric (QC TUMERIC COMPLEX PO) Take by mouth.    zinc gluconate 50 MG tablet Take 50 mg by mouth daily.    No facility-administered encounter medications on file as of 06/21/2023.    ALLERGIES:  Allergies  Allergen Reactions   Molds & Smuts Shortness Of Breath    wheezing   Other Anaphylaxis, Shortness Of Breath and Other (See Comments)     :  Angioedema wheezing Wheezing wheezing Other reaction(s): Cough Wheezing Itching and rash Wheezing wheezing Wheezing wheezing Other reaction(s): Other (See Comments)  : Angioedema wheezing Wheezing wheezing Other reaction(s): Cough Wheezing Itching and rash Wheezing wheezing Other reaction(s): Other (See Comments) Wheezing wheezing  : Angioedema wheezing Wheezing wheezing Other reaction(s): Cough Wheezing Itching and rash Wheezing wheezing Wheezing wheezing   Sulfa Antibiotics Anaphylaxis     : Angioedema    Sulfamethoxazole-Trimethoprim Anaphylaxis   Sulfonamide Derivatives Anaphylaxis     : Angioedema   Trichophyton Shortness Of Breath    wheezing wheezing wheezing wheezing wheezing wheezing wheezing wheezing wheezing   Carbamazepine Rash   Dust Mite Extract Cough and Other (See Comments)    Wheezing Other reaction(s): Wheezing Wheezing Wheezing Other reaction(s): Wheezing Wheezing Wheezing Other reaction(s): Wheezing Wheezing   Gluten Meal Itching   Clonazepam Other (See Comments)    Confusion and memory loss Caused nconfusion   Quetiapine Fumarate Other (See Comments)    LABORATORY DATA:  I have reviewed the labs as listed.  CBC    Component Value Date/Time   WBC 9.0 07/03/2019 1230   RBC 4.64 07/03/2019 1230   HGB 14.7 07/03/2019 1230   HCT 42.8 07/03/2019 1230   PLT 288 07/03/2019 1230   MCV 92.2 07/03/2019 1230   MCH 31.7 07/03/2019 1230   MCHC 34.3 07/03/2019 1230   RDW 12.4 07/03/2019 1230   LYMPHSABS 2.2 07/03/2019 1230   MONOABS 0.5 07/03/2019 1230   EOSABS 0.3 07/03/2019 1230   BASOSABS 0.1 07/03/2019 1230      Latest Ref Rng & Units 03/31/2022   12:00 AM 12/23/2021   12:00 AM 08/06/2021   12:00 AM  CMP  BUN 4 - 21 16     20     16       Creatinine 0.5 - 1.1 1.3     1.2     1.4      Sodium 137 - 147  139     143      Potassium 3.4 - 5.3  5.0     5.0      Chloride 99 - 108  100     102      CO2 13 - 22  21     26        Calcium 8.7 - 10.7 10.4     10.1     10.8      Alkaline Phos 25 - 125  83     78  AST 13 - 35 39     43     28      ALT 7 - 35 U/L 39     39     28         This result is from an external source.    DIAGNOSTIC IMAGING:  I have independently reviewed the relevant imaging and discussed with the patient.   WRAP UP:  All questions were answered. The patient knows to call the clinic with any problems, questions or concerns.  Medical decision making: Moderate  Time spent on visit: I spent 20 minutes counseling the patient face to face. The total time spent in the appointment was 30 minutes and more than 50% was on counseling.  Carnella Guadalajara, PA-C  06/21/23 9:51 AM

## 2023-06-21 ENCOUNTER — Inpatient Hospital Stay: Payer: PPO | Attending: Physician Assistant | Admitting: Physician Assistant

## 2023-06-21 ENCOUNTER — Other Ambulatory Visit: Payer: PPO

## 2023-06-21 VITALS — BP 132/70 | HR 64 | Temp 98.5°F | Resp 16 | Wt 249.1 lb

## 2023-06-21 DIAGNOSIS — K219 Gastro-esophageal reflux disease without esophagitis: Secondary | ICD-10-CM | POA: Insufficient documentation

## 2023-06-21 DIAGNOSIS — Z87440 Personal history of urinary (tract) infections: Secondary | ICD-10-CM | POA: Insufficient documentation

## 2023-06-21 DIAGNOSIS — K760 Fatty (change of) liver, not elsewhere classified: Secondary | ICD-10-CM | POA: Diagnosis not present

## 2023-06-21 DIAGNOSIS — D801 Nonfamilial hypogammaglobulinemia: Secondary | ICD-10-CM | POA: Insufficient documentation

## 2023-06-21 DIAGNOSIS — M797 Fibromyalgia: Secondary | ICD-10-CM | POA: Diagnosis not present

## 2023-06-21 DIAGNOSIS — E119 Type 2 diabetes mellitus without complications: Secondary | ICD-10-CM | POA: Insufficient documentation

## 2023-06-21 DIAGNOSIS — Z79899 Other long term (current) drug therapy: Secondary | ICD-10-CM | POA: Insufficient documentation

## 2023-06-21 DIAGNOSIS — K589 Irritable bowel syndrome without diarrhea: Secondary | ICD-10-CM | POA: Diagnosis not present

## 2023-06-21 NOTE — Patient Instructions (Signed)
[facility] at [facility] **VISIT SUMMARY & IMPORTANT INSTRUCTIONS **   You were seen today by Rojelio Brenner PA-C for your hypogammaglobulinemia (immunoglobulin deficiency).    SUBCUTANEOUS IgG We will continue your subcutaneous IgG (Xembify) at the current dose. Overall, you seem to be tolerating your Xembify better than the IVIG infusions. Common side effects of Xembify include headache, fatigue, nausea, diarrhea, abdominal pain, injection site reactions, and fever. Risks associated with Xembify include risk of kidney injury and renal failure, as well as the risk of blood clots in your legs or your lungs ("DVT or PE"). Make sure that you drink plenty of water to maintain excellent hydration while you are taking Xembify! Please see the attached handout for important information about subcutaneous IgG.  LABS: You have been given lab orders for [facility].  Please check these labs the day before your Xembify infusion. Set #1 next Monday Set #2 in 3 months (at least 1 week prior to your next office visit)  FOLLOW-UP APPOINTMENT: Office visit in 3 months  ** Thank you for trusting me with your healthcare!  I strive to provide all of my patients with quality care at each visit.  If you receive a survey for this visit, I would be so grateful to you for taking the time to provide feedback.  Thank you in advance!  ~ Nolita Kutter                   Dr. Doreatha Massed   &   Rojelio Brenner, PA-C   - - - - - - - - - - - - - - - - - -    Thank you for choosing [facility] at [facility] to provide your oncology and hematology care.  To afford each patient quality time with our provider, please arrive at least 15 minutes before your scheduled appointment time.   If you have a lab appointment with the Cancer Center please come in thru the Main Entrance and check in at the main information desk.  You need to re-schedule your appointment should you  arrive 10 or more minutes late.  We strive to give you quality time with our providers, and arriving late affects you and other patients whose appointments are after yours.  Also, if you no show three or more times for appointments you may be dismissed from the clinic at the providers discretion.     Again, thank you for choosing [facility].  Our hope is that these requests will decrease the amount of time that you wait before being seen by our physicians.       _____________________________________________________________  Should you have questions after your visit to [facility], please contact our office at [phone] and follow the prompts.  Our office hours are 8:00 a.m. and 4:30 p.m. Monday - Friday.  Please note that voicemails left after 4:00 p.m. may not be returned until the following business day.  We are closed weekends and major holidays.  You do have access to a nurse 24-7, just call the main number to the clinic [phone] and do not press any options, hold on the line and a nurse will answer the phone.    For prescription refill requests, have your pharmacy contact our office and allow 72 hours.

## 2023-06-21 NOTE — Addendum Note (Signed)
Addended by: Cynda Acres A on: 06/21/2023 09:32 AM   Modules accepted: Orders

## 2023-06-27 DIAGNOSIS — I10 Essential (primary) hypertension: Secondary | ICD-10-CM | POA: Diagnosis not present

## 2023-06-27 DIAGNOSIS — R296 Repeated falls: Secondary | ICD-10-CM | POA: Diagnosis not present

## 2023-06-27 DIAGNOSIS — M2550 Pain in unspecified joint: Secondary | ICD-10-CM | POA: Diagnosis not present

## 2023-06-27 DIAGNOSIS — Z713 Dietary counseling and surveillance: Secondary | ICD-10-CM | POA: Diagnosis not present

## 2023-06-27 DIAGNOSIS — R269 Unspecified abnormalities of gait and mobility: Secondary | ICD-10-CM | POA: Diagnosis not present

## 2023-06-27 DIAGNOSIS — M25512 Pain in left shoulder: Secondary | ICD-10-CM | POA: Diagnosis not present

## 2023-06-27 DIAGNOSIS — D801 Nonfamilial hypogammaglobulinemia: Secondary | ICD-10-CM | POA: Diagnosis not present

## 2023-06-27 DIAGNOSIS — G43909 Migraine, unspecified, not intractable, without status migrainosus: Secondary | ICD-10-CM | POA: Diagnosis not present

## 2023-06-27 DIAGNOSIS — G43109 Migraine with aura, not intractable, without status migrainosus: Secondary | ICD-10-CM | POA: Diagnosis not present

## 2023-06-27 DIAGNOSIS — M797 Fibromyalgia: Secondary | ICD-10-CM | POA: Diagnosis not present

## 2023-06-27 DIAGNOSIS — G894 Chronic pain syndrome: Secondary | ICD-10-CM | POA: Diagnosis not present

## 2023-06-27 DIAGNOSIS — D839 Common variable immunodeficiency, unspecified: Secondary | ICD-10-CM | POA: Diagnosis not present

## 2023-06-28 ENCOUNTER — Encounter: Payer: Self-pay | Admitting: Physician Assistant

## 2023-06-28 NOTE — Progress Notes (Signed)
Reviewed labs from [facility], drawn on 06/27/2023. - IgG at therapeutic goal (>600) with IgG 795.  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  Creatinine 1.16/GFR 56, stable compared to April 2024. - CBC/D within normal limits.  **Labs to be repeated in 3 months, 1 week prior to next office visit.  Carnella Guadalajara, PA-C  06/28/23 4:21 PM

## 2023-07-04 DIAGNOSIS — D801 Nonfamilial hypogammaglobulinemia: Secondary | ICD-10-CM | POA: Diagnosis not present

## 2023-07-05 ENCOUNTER — Ambulatory Visit (INDEPENDENT_AMBULATORY_CARE_PROVIDER_SITE_OTHER): Payer: PPO | Admitting: Nurse Practitioner

## 2023-07-05 ENCOUNTER — Encounter: Payer: Self-pay | Admitting: Nurse Practitioner

## 2023-07-05 VITALS — BP 117/73 | HR 72 | Ht 71.0 in | Wt 250.2 lb

## 2023-07-05 DIAGNOSIS — Z794 Long term (current) use of insulin: Secondary | ICD-10-CM | POA: Diagnosis not present

## 2023-07-05 DIAGNOSIS — R768 Other specified abnormal immunological findings in serum: Secondary | ICD-10-CM

## 2023-07-05 DIAGNOSIS — E1165 Type 2 diabetes mellitus with hyperglycemia: Secondary | ICD-10-CM

## 2023-07-05 LAB — POCT GLYCOSYLATED HEMOGLOBIN (HGB A1C): Hemoglobin A1C: 6.5 % — AB (ref 4.0–5.6)

## 2023-07-05 MED ORDER — FREESTYLE LIBRE 3 SENSOR MISC: 3 refills | Status: DC

## 2023-07-05 MED ORDER — INSULIN GLARGINE 100 UNIT/ML SOLOSTAR PEN: 50.0000 [IU] | PEN_INJECTOR | Freq: Every day | SUBCUTANEOUS | 3 refills | Status: DC

## 2023-07-05 NOTE — Progress Notes (Signed)
Endocrinology Follow Up Visit       07/05/2023, 11:30 AM   Subjective:    Patient ID: Michelle Clements, female    DOB: 20-Nov-1966.  Michelle Clements is being seen in follow up after being seen in consultation for management of currently uncontrolled symptomatic diabetes requested by  Benita Stabile, MD.   Past Medical History:  Diagnosis Date   Allergic rhinitis    Anemia    Arthritis    Lower back and hips   Asthma    Bipolar disorder (HCC)    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HCC)    Constipation    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HCC)    Depression    Essential hypertension, benign    Fibromyalgia    GERD (gastroesophageal reflux disease)    H/O sleep apnea    Hip pain, left    History of palpitations    Negative Holter monitor   History of pneumonia 1988   Hyperlipidemia    IBS (irritable bowel syndrome)    Neck pain    Poor short term memory    PTSD (post-traumatic stress disorder)    S/P Botox injection 11/2014    For migraine headaches   Type 2 diabetes mellitus (HCC)    Urgency of urination     Past Surgical History:  Procedure Laterality Date   CARPAL TUNNEL RELEASE Right 06/10/2013   Procedure: CARPAL TUNNEL RELEASE;  Surgeon: Reinaldo Meeker, MD;  Location: [facility];  Service: Neurosurgery;  Laterality: Right;  Right Carpal Tunnel Release    CHOLECYSTECTOMY     DENTAL SURGERY  12/2014   mole exc     x 3   MUSCLE BIOPSY  2008   Right leg   TUBAL LIGATION      Social History   Socioeconomic History   Marital status: Married    Spouse name: Not on file   Number of children: 2   Years of education: College   Highest education level: Associate degree: academic program  Occupational History   Occupation: Chiropractor: UNEMPLOYED    Comment: Now disabled due to bipolar and fibromyalgia  Tobacco Use   Smoking status: Former    Current packs/day: 0.00    Types:  Cigarettes    Quit date: 11/04/2010    Years since quitting: 12.6   Smokeless tobacco: Never  Vaping Use   Vaping status: Never Used  Substance and Sexual Activity   Alcohol use: Yes    Comment: one mixed drink per month   Drug use: No   Sexual activity: Yes    Birth control/protection: Surgical  Other Topics Concern   Not on file  Social History Narrative   Married   Lives with spouse and daughter   Right handed.   Caffeine use: 2 cups coffee in morning and 2 cups tea during the day    Social Determinants of Health   Financial Resource Strain: Low Risk  (07/27/2021)   Overall Financial Resource Strain (CARDIA)    Difficulty of Paying Living Expenses: Not very hard  Food Insecurity: No Food Insecurity (07/27/2021)   Hunger Vital Sign    Worried About  Running Out of Food in the Last Year: Never true    Ran Out of Food in the Last Year: Never true  Transportation Needs: No Transportation Needs (07/27/2021)   PRAPARE - Administrator, Civil Service (Medical): No    Lack of Transportation (Non-Medical): No  Physical Activity: Inactive (07/27/2021)   Exercise Vital Sign    Days of Exercise per Week: 0 days    Minutes of Exercise per Session: 0 min  Stress: No Stress Concern Present (07/27/2021)   [employer] of Occupational Health - Occupational Stress Questionnaire    Feeling of Stress : Not at all  Social Connections: Unknown (04/18/2022)   [facility]   Social Network    Social Network: Not on file    Family History  Problem Relation Age of Onset   Fibromyalgia Mother    Diabetes type II Mother    Depression Mother    Alzheimer's disease Mother    GER disease Mother    Heart disease Mother    Paranoid behavior Mother    Dementia Mother    Heart attack Father 59       MI x5   Stroke Father        CVA x7   Asthma Father    Brain cancer Father    Seizures Father    Anxiety disorder Sister    OCD Sister    Sexual  abuse Sister    Physical abuse Sister    Anxiety disorder Sister    ADD / ADHD Daughter    ADD / ADHD Daughter    Alcohol abuse Neg Hx    Drug abuse Neg Hx    Schizophrenia Neg Hx     Outpatient Encounter Medications as of 07/05/2023  Medication Sig   albuterol (VENTOLIN HFA) 108 (90 Base) MCG/ACT inhaler albuterol sulfate HFA 90 mcg/actuation aerosol inhaler  INHALE TWO PUFFS BY MOUTH INTO LUNGS EVERY 6 HOURS AS NEEDED   aspirin EC 81 MG tablet Take 1 tablet every day by oral route.   Blood Glucose Monitoring Suppl (ONETOUCH VERIO FLEX SYSTEM) w/Device KIT Use as directed to check blood glucose twice daily, before breakfast and before bed.   buPROPion (WELLBUTRIN XL) 150 MG 24 hr tablet Take 1 tablet (150 mg total) by mouth every morning.   cetirizine (ZYRTEC) 10 MG tablet Take 10 mg by mouth at bedtime.   Cholecalciferol (VITAMIN D) 50 MCG (2000 UT) tablet Take 2,000 Units by mouth daily.   clonazePAM (KLONOPIN) 0.5 MG tablet TAKE 1 TABLET BY MOUTH EVERY MORNING AND TAKE TWO(2) TABLETS EVERY NIGHT AT BEDTIME.   cycloSPORINE (RESTASIS) 0.05 % ophthalmic emulsion Apply to eye.   estradiol (ESTRACE) 0.1 MG/GM vaginal cream Place vaginally.   FLUoxetine (PROZAC) 40 MG capsule Take 1 capsule (40 mg total) by mouth daily.   fluticasone (FLONASE) 50 MCG/ACT nasal spray Place 1 spray into both nostrils daily.   Glucos-Chond-Hyal Ac-Ca Fructo (MOVE FREE JOINT HEALTH ADVANCE PO) Take by mouth.   Immune Globulin, Human,-klhw (XEMBIFY) 10 GM/50ML SOLN Inject 10 g into the skin every 7 (seven) days.   lamoTRIgine (LAMICTAL) 25 MG tablet TAKE (1) TABLET TWICE DAILY.   Lancets (ONETOUCH ULTRASOFT) lancets 1 each by Other route as needed.    lidocaine (LIDODERM) 5 % lidocaine 5 % topical patch  APPLY 1 PATCH BY TOPICAL ROUTE ONCE DAILY (MAY WEAR UP TO 12HOURS.)   lubiprostone (AMITIZA) 24 MCG capsule Take 24 mcg by  mouth 2 (two) times daily.   Melatonin 10 MG TABS Take 1 tablet every day by oral  route at bedtime.   Multiple Vitamin (MULTIVITAMIN) tablet Take 1 tablet by mouth daily.   omeprazole (PRILOSEC) 20 MG capsule Take 20 mg by mouth daily.   ONETOUCH VERIO test strip 4 (four) times daily.   pregabalin (LYRICA) 200 MG capsule Take 200 mg by mouth 3 (three) times daily.   rosuvastatin (CRESTOR) 20 MG tablet TAKE ONE TABLET BY MOUTH EVERYDAY AT BEDTIME   sennosides-docusate sodium (SENOKOT-S) 8.6-50 MG tablet Take 1 tablet by mouth daily.   tiZANidine (ZANAFLEX) 4 MG tablet Take 4 mg by mouth daily. 4mg  at bedtime and 2 mg during the day   traZODone (DESYREL) 50 MG tablet Take 1 tablet (50 mg total) by mouth at bedtime.   zinc gluconate 50 MG tablet Take 50 mg by mouth daily.   [DISCONTINUED] Continuous Glucose Sensor (FREESTYLE LIBRE 3 SENSOR) MISC Change sensor every 14 days   [DISCONTINUED] insulin glargine (LANTUS) 100 UNIT/ML Solostar Pen Inject 50 Units into the skin at bedtime.   Continuous Glucose Sensor (FREESTYLE LIBRE 3 SENSOR) MISC Change sensor every 14 days   insulin glargine (LANTUS) 100 UNIT/ML Solostar Pen Inject 50 Units into the skin at bedtime.   [DISCONTINUED] Turmeric (QC TUMERIC COMPLEX PO) Take by mouth.   No facility-administered encounter medications on file as of 07/05/2023.    ALLERGIES: Allergies  Allergen Reactions   Molds & Smuts Shortness Of Breath    wheezing   Other Anaphylaxis, Shortness Of Breath and Other (See Comments)     : Angioedema wheezing Wheezing wheezing Other reaction(s): Cough Wheezing Itching and rash Wheezing wheezing Wheezing wheezing Other reaction(s): Other (See Comments)  : Angioedema wheezing Wheezing wheezing Other reaction(s): Cough Wheezing Itching and rash Wheezing wheezing Other reaction(s): Other (See Comments) Wheezing wheezing  : Angioedema wheezing Wheezing wheezing Other reaction(s): Cough Wheezing Itching and rash Wheezing wheezing Wheezing wheezing   Sulfa Antibiotics Anaphylaxis      : Angioedema    Sulfamethoxazole-Trimethoprim Anaphylaxis   Sulfonamide Derivatives Anaphylaxis     : Angioedema   Trichophyton Shortness Of Breath    wheezing wheezing wheezing wheezing wheezing wheezing wheezing wheezing wheezing   Carbamazepine Rash   Dust Mite Extract Cough and Other (See Comments)    Wheezing Other reaction(s): Wheezing Wheezing Wheezing Other reaction(s): Wheezing Wheezing Wheezing Other reaction(s): Wheezing Wheezing   Gluten Meal Itching   Clonazepam Other (See Comments)    Confusion and memory loss Caused nconfusion   Quetiapine Fumarate Other (See Comments)    VACCINATION STATUS: Immunization History  Administered Date(s) Administered   Influenza Whole 10/03/2007   Influenza-Unspecified 09/18/2014, 09/23/2015   Pneumococcal Polysaccharide-23 10/14/2012    Diabetes She presents for her follow-up diabetic visit. She has type 2 diabetes mellitus. Onset time: She was diagnosed at approximate age of 19. Her disease course has been improving. There are no hypoglycemic associated symptoms. Pertinent negatives for diabetes include no blurred vision, no fatigue, no polydipsia, no polyphagia and no polyuria. There are no hypoglycemic complications. Symptoms are stable. Diabetic complications include nephropathy and retinopathy. Risk factors for coronary artery disease include diabetes mellitus, dyslipidemia, obesity and sedentary lifestyle. Current diabetic treatment includes insulin injections (and Mounjaro). She is compliant with treatment all of the time. Her weight is fluctuating minimally. She is following a generally healthy diet. Meal planning includes avoidance of concentrated sweets. She has not had a previous visit with a dietitian. She participates  in exercise intermittently. Her home blood glucose trend is decreasing steadily. Her breakfast blood glucose range is generally 90-110 mg/dl. Her bedtime blood glucose range is generally 140-180  mg/dl. (She presents today with her CGM and logs showing at goal glycemic profile.  Her POCT A1c today is 6.5%, improving from last visit of 7.5%.  Analysis of her CGM shows TIR 88%, TAR 12%, TBR 0%.  She denies any hypoglycemia.) An ACE inhibitor/angiotensin II receptor blocker is not being taken. She does not see a podiatrist.Eye exam is current.  Hyperlipidemia This is a chronic problem. The current episode started more than 1 year ago. The problem is uncontrolled. Recent lipid tests were reviewed and are variable. Exacerbating diseases include chronic renal disease, diabetes and obesity. There are no known factors aggravating her hyperlipidemia. Current antihyperlipidemic treatment includes statins. The current treatment provides moderate improvement of lipids. There are no compliance problems.  Risk factors for coronary artery disease include diabetes mellitus, dyslipidemia, obesity and a sedentary lifestyle.    Review of systems  Constitutional: + stable body weight,  current Body mass index is 34.9 kg/m. , no fatigue, no subjective hyperthermia, no subjective hypothermia Eyes: no blurry vision, no xerophthalmia ENT: no sore throat, no nodules palpated in throat, no dysphagia/odynophagia, no hoarseness Cardiovascular: no chest pain, no shortness of breath, no palpitations, no leg swelling Respiratory: no cough, no shortness of breath Gastrointestinal: no nausea/vomiting/diarrhea Musculoskeletal: no muscle/joint aches Skin: no rashes, no hyperemia Neurological: no tremors, no numbness, no tingling, no dizziness Psychiatric: no depression, no anxiety, hx bipolar d/o- controlled on meds   Objective:    BP 117/73 (BP Location: Left Arm, Patient Position: Sitting, Cuff Size: Large)   Pulse 72   Ht 5\' 11"  (1.803 m)   Wt 250 lb 3.2 oz (113.5 kg)   LMP 01/04/2018   BMI 34.90 kg/m   Wt Readings from Last 3 Encounters:  07/05/23 250 lb 3.2 oz (113.5 kg)  06/21/23 249 lb 1.9 oz (113 kg)   03/01/23 254 lb 11.2 oz (115.5 kg)    BP Readings from Last 3 Encounters:  07/05/23 117/73  06/21/23 132/70  03/01/23 122/63     Physical Exam- Limited  Constitutional:  Body mass index is 34.9 kg/m. , not in acute distress, normal state of mind Musculoskeletal: no gross deformities, strength intact in all four extremities, no gross restriction of joint movements Skin:  no rashes, no hyperemia Neurological: no tremor with outstretched hands   Diabetic Foot Exam - Simple   No data filed    CMP ( most recent) CMP     Component Value Date/Time   NA 139 12/23/2021 0000   K 5.0 12/23/2021 0000   CL 100 12/23/2021 0000   CO2 21 12/23/2021 0000   GLUCOSE 119 (H) 07/03/2019 1230   BUN 16 03/31/2022 0000   CREATININE 1.3 (A) 03/31/2022 0000   CREATININE 1.00 07/03/2019 1230   CREATININE 0.88 02/21/2012 1153   CALCIUM 10.4 03/31/2022 0000   PROT 7.0 07/03/2019 1230   ALBUMIN 4.4 12/23/2021 0000   AST 39 (A) 03/31/2022 0000   ALT 39 (A) 03/31/2022 0000   ALKPHOS 83 12/23/2021 0000   BILITOT 0.9 07/03/2019 1230   GFRNONAA 44 12/29/2020 0000   GFRAA 50 12/29/2020 0000     Diabetic Labs (most recent): Lab Results  Component Value Date   HGBA1C 6.5 (A) 07/05/2023   HGBA1C 5.7 (A) 08/11/2022   HGBA1C 7.4 03/31/2022   MICROALBUR 26 03/31/2022   MICROALBUR  150 10/22/2020     Lipid Panel ( most recent) Lipid Panel     Component Value Date/Time   CHOL 196 08/06/2021 0000   TRIG 199 (A) 03/31/2022 0000   HDL 54 08/06/2021 0000   CHOLHDL 4.2 03/18/2015 0915   VLDL 44 (H) 03/18/2015 0915   LDLCALC 126 03/31/2022 0000      Lab Results  Component Value Date   TSH 1.920 02/03/2023   TSH 2.69 12/29/2020   TSH 1.66 09/14/2020   TSH 2.770 08/29/2019   TSH 3.261 10/14/2012   TSH 2.651 02/21/2012   TSH 1.981 03/06/2009   TSH 3.250 01/23/2008   FREET4 1.05 02/03/2023           Assessment & Plan:   1) Type 2 diabetes mellitus with hyperglycemia, with  long-term current use of insulin (HCC)  - Michelle Clements has currently uncontrolled symptomatic type 2 DM since 57 years of age.  She presents today with her CGM and logs showing at goal glycemic profile.  Her POCT A1c today is 6.5%, improving from last visit of 7.5%.  Analysis of her CGM shows TIR 88%, TAR 12%, TBR 0%.  She denies any hypoglycemia.   Recent labs reviewed, showing stable CKD stage 3.  - I had a long discussion with her about the progressive nature of diabetes and the pathology behind its complications. -her diabetes is complicated by sleep apnea, HLD, retinopathy and she remains at a high risk for more acute and chronic complications which include CAD, CVA, CKD, retinopathy, and neuropathy. These are all discussed in detail with her.  - Nutritional counseling repeated at each appointment due to patients tendency to fall back in to old habits.  - The patient admits there is a room for improvement in their diet and drink choices. -  Suggestion is made for the patient to avoid simple carbohydrates from their diet including Cakes, Sweet Desserts / Pastries, Ice Cream, Soda (diet and regular), Sweet Tea, Candies, Chips, Cookies, Sweet Pastries, Store Bought Juices, Alcohol in Excess of 1-2 drinks a day, Artificial Sweeteners, Coffee Creamer, and "Sugar-free" Products. This will help patient to have stable blood glucose profile and potentially avoid unintended weight gain.   - I encouraged the patient to switch to unprocessed or minimally processed complex starch and increased protein intake (animal or plant source), fruits, and vegetables.   - Patient is advised to stick to a routine mealtimes to eat 3 meals a day and avoid unnecessary snacks (to snack only to correct hypoglycemia).  - she is following with Norm Salt, RDE for diabetes education.  - I have approached her with the following individualized plan to manage  her diabetes and patient agrees:   -Based on her improved,  at target glycemic profile and lack of hypoglycemia, no changes will be made to her medication regimen today.  She is advised to continue her Lantus 50 units SQ nightly.   -She is encouraged to continue monitoring blood glucose twice daily, before breakfast and before bed, and to call the clinic if she has readings less than 70 or greater than 200 for 3 tests in a row.  - Specific targets for  A1c;  LDL, HDL,  and Triglycerides were discussed with the patient.  2) Blood Pressure /Hypertension:   her blood pressure is controlled to target without the use of antihypertensive medications.  She is advised to continue Lisinopril 5 mg po daily with breakfast.   3) Lipids/Hyperlipidemia:    Her most  recent lipid panel from 11/15/22 shows uncontrolled LDL of 239 and elevated triglycerides of 171.  She is advised to continue her Crestor 20 mg po daily at bedtime.  Side effects and precautions discussed with her.  4) Weight/Diet:  Her Body mass index is 34.9 kg/m.  -  clearly complicating her diabetes care.  She has lost approx 10 lbs and is advised to keep it up!  she is a candidate for weight loss. I discussed with her the fact that loss of 5 - 10% of her  current body weight will have the most impact on her diabetes management.  Exercise, and detailed carbohydrates information provided  -  detailed on discharge instructions.  5) Vitamin D deficiency Her most recent vitamin D level was 76.6 on 03/31/22.  She is currently on OTC vitamin D3 2000 units daily.  She can take a break from supplementation at this time.  6) Chronic Care/Health Maintenance: -she is on Statin medications and is encouraged to initiate and continue to follow up with Ophthalmology, Dentist,  Podiatrist at least yearly or according to recommendations, and advised to stay away from smoking. I have recommended yearly flu vaccine and pneumonia vaccine at least every 5 years; moderate intensity exercise for up to 150 minutes weekly; and   sleep for at least 7 hours a day.  7) Positive thyroid antibodies Thyroglobulin antibodies were high at 10.1 on 02/03/23 indicating genetically predisposed to developing a thyroid issue in the future.  And her thyroid is consistent with euthyroid presentation at this time.  She does not need antithyroid treatment or thyroid hormone replacement at this time.  She has routine labs coming up at the end of August with her PCP.  Will review at that time.    - she is advised to maintain close follow up with Benita Stabile, MD for primary care needs, as well as her other providers for optimal and coordinated care.     I spent  31  minutes in the care of the patient today including review of labs from CMP, Lipids, Thyroid Function, Hematology (current and previous including abstractions from other facilities); face-to-face time discussing  her blood glucose readings/logs, discussing hypoglycemia and hyperglycemia episodes and symptoms, medications doses, her options of short and long term treatment based on the latest standards of care / guidelines;  discussion about incorporating lifestyle medicine;  and documenting the encounter. Risk reduction counseling performed per USPSTF guidelines to reduce obesity and cardiovascular risk factors.     Please refer to Patient Instructions for Blood Glucose Monitoring and Insulin/Medications Dosing Guide"  in media tab for additional information. Please  also refer to " Patient Self Inventory" in the Media  tab for reviewed elements of pertinent patient history.  Michelle Clements participated in the discussions, expressed understanding, and voiced agreement with the above plans.  All questions were answered to her satisfaction. she is encouraged to contact clinic should she have any questions or concerns prior to her return visit.   Follow up plan: - Return in about 4 months (around 11/04/2023) for Diabetes F/U with A1c in office, No previsit labs, Bring meter and  logs.  Ronny Bacon, [facility] [address] [place], [place] [address] Phone: [phone] Fax: [phone]  07/05/2023, 11:30 AM

## 2023-07-06 DIAGNOSIS — H18513 Endothelial corneal dystrophy, bilateral: Secondary | ICD-10-CM | POA: Diagnosis not present

## 2023-07-06 DIAGNOSIS — H04123 Dry eye syndrome of bilateral lacrimal glands: Secondary | ICD-10-CM | POA: Diagnosis not present

## 2023-07-07 DIAGNOSIS — M25512 Pain in left shoulder: Secondary | ICD-10-CM | POA: Diagnosis not present

## 2023-07-07 DIAGNOSIS — M5114 Intervertebral disc disorders with radiculopathy, thoracic region: Secondary | ICD-10-CM | POA: Diagnosis not present

## 2023-07-10 ENCOUNTER — Encounter: Payer: Self-pay | Admitting: Physician Assistant

## 2023-07-10 ENCOUNTER — Encounter: Payer: Self-pay | Admitting: Hematology & Oncology

## 2023-07-10 DIAGNOSIS — M5114 Intervertebral disc disorders with radiculopathy, thoracic region: Secondary | ICD-10-CM | POA: Diagnosis not present

## 2023-07-10 DIAGNOSIS — M25512 Pain in left shoulder: Secondary | ICD-10-CM | POA: Diagnosis not present

## 2023-07-11 DIAGNOSIS — D801 Nonfamilial hypogammaglobulinemia: Secondary | ICD-10-CM | POA: Diagnosis not present

## 2023-07-19 ENCOUNTER — Other Ambulatory Visit (HOSPITAL_COMMUNITY): Payer: Self-pay

## 2023-07-19 DIAGNOSIS — M5114 Intervertebral disc disorders with radiculopathy, thoracic region: Secondary | ICD-10-CM | POA: Diagnosis not present

## 2023-07-19 DIAGNOSIS — G894 Chronic pain syndrome: Secondary | ICD-10-CM | POA: Diagnosis not present

## 2023-07-19 DIAGNOSIS — M25512 Pain in left shoulder: Secondary | ICD-10-CM | POA: Diagnosis not present

## 2023-07-20 ENCOUNTER — Ambulatory Visit: Payer: PPO | Admitting: Nurse Practitioner

## 2023-07-20 ENCOUNTER — Other Ambulatory Visit (HOSPITAL_COMMUNITY): Payer: Self-pay

## 2023-07-20 DIAGNOSIS — G4486 Cervicogenic headache: Secondary | ICD-10-CM | POA: Diagnosis not present

## 2023-07-20 DIAGNOSIS — G43719 Chronic migraine without aura, intractable, without status migrainosus: Secondary | ICD-10-CM | POA: Diagnosis not present

## 2023-07-21 ENCOUNTER — Encounter: Payer: Self-pay | Admitting: Nurse Practitioner

## 2023-07-21 DIAGNOSIS — M25512 Pain in left shoulder: Secondary | ICD-10-CM | POA: Diagnosis not present

## 2023-07-21 DIAGNOSIS — F319 Bipolar disorder, unspecified: Secondary | ICD-10-CM | POA: Diagnosis not present

## 2023-07-21 DIAGNOSIS — M5114 Intervertebral disc disorders with radiculopathy, thoracic region: Secondary | ICD-10-CM | POA: Diagnosis not present

## 2023-07-24 DIAGNOSIS — M5114 Intervertebral disc disorders with radiculopathy, thoracic region: Secondary | ICD-10-CM | POA: Diagnosis not present

## 2023-07-24 DIAGNOSIS — M25512 Pain in left shoulder: Secondary | ICD-10-CM | POA: Diagnosis not present

## 2023-07-25 DIAGNOSIS — G4486 Cervicogenic headache: Secondary | ICD-10-CM | POA: Diagnosis not present

## 2023-07-25 DIAGNOSIS — E559 Vitamin D deficiency, unspecified: Secondary | ICD-10-CM | POA: Diagnosis not present

## 2023-07-25 DIAGNOSIS — G43719 Chronic migraine without aura, intractable, without status migrainosus: Secondary | ICD-10-CM | POA: Insufficient documentation

## 2023-07-25 DIAGNOSIS — E1122 Type 2 diabetes mellitus with diabetic chronic kidney disease: Secondary | ICD-10-CM | POA: Diagnosis not present

## 2023-07-25 DIAGNOSIS — E782 Mixed hyperlipidemia: Secondary | ICD-10-CM | POA: Diagnosis not present

## 2023-07-26 ENCOUNTER — Other Ambulatory Visit (HOSPITAL_COMMUNITY): Payer: Self-pay | Admitting: Psychiatry

## 2023-07-26 ENCOUNTER — Other Ambulatory Visit (HOSPITAL_COMMUNITY): Payer: Self-pay

## 2023-07-26 ENCOUNTER — Other Ambulatory Visit: Payer: Self-pay | Admitting: Nurse Practitioner

## 2023-07-27 ENCOUNTER — Other Ambulatory Visit (HOSPITAL_COMMUNITY): Payer: Self-pay

## 2023-07-27 ENCOUNTER — Other Ambulatory Visit: Payer: Self-pay

## 2023-07-27 MED ORDER — FREESTYLE LIBRE 3 SENSOR MISC
1 refills | Status: DC
  Filled 2023-07-27 – 2023-11-17 (×2): qty 6, 84d supply, fill #0

## 2023-07-27 MED ORDER — ROSUVASTATIN CALCIUM 40 MG PO TABS
40.0000 mg | ORAL_TABLET | Freq: Every day | ORAL | 1 refills | Status: DC
  Filled 2023-07-27 (×2): qty 90, 90d supply, fill #0
  Filled 2023-10-02: qty 30, 30d supply, fill #0
  Filled 2023-11-15 – 2023-12-18 (×2): qty 30, 30d supply, fill #1
  Filled 2024-01-08 – 2024-01-16 (×3): qty 30, 30d supply, fill #2
  Filled 2024-01-29 – 2024-02-13 (×3): qty 30, 30d supply, fill #3
  Filled 2024-02-16 – 2024-03-12 (×3): qty 30, 30d supply, fill #4
  Filled 2024-03-27 – 2024-04-09 (×2): qty 30, 30d supply, fill #5

## 2023-07-27 MED ORDER — TRAZODONE HCL 50 MG PO TABS
50.0000 mg | ORAL_TABLET | Freq: Every day | ORAL | 0 refills | Status: DC
  Filled 2023-07-27 – 2023-08-10 (×3): qty 30, 30d supply, fill #0

## 2023-07-27 MED ORDER — FLUOXETINE HCL 40 MG PO CAPS
40.0000 mg | ORAL_CAPSULE | Freq: Every day | ORAL | 0 refills | Status: DC
  Filled 2023-07-27 – 2023-08-10 (×4): qty 30, 30d supply, fill #0

## 2023-07-27 MED ORDER — AZELASTINE HCL 0.1 % NA SOLN
1.0000 | Freq: Two times a day (BID) | NASAL | 2 refills | Status: AC
  Filled 2023-07-27: qty 30, 30d supply, fill #0
  Filled 2023-07-27 – 2023-08-03 (×2): qty 30, 100d supply, fill #0
  Filled 2023-08-09: qty 30, 50d supply, fill #0
  Filled 2023-08-10: qty 30, 30d supply, fill #0
  Filled 2023-08-10 – 2023-08-11 (×2): qty 30, 50d supply, fill #0
  Filled 2023-08-12: qty 30, 100d supply, fill #0
  Filled 2023-09-05 – 2023-11-17 (×3): qty 30, 100d supply, fill #1
  Filled 2024-02-26: qty 30, 100d supply, fill #2

## 2023-07-27 MED ORDER — LANTUS SOLOSTAR 100 UNIT/ML ~~LOC~~ SOPN
50.0000 [IU] | PEN_INJECTOR | Freq: Every day | SUBCUTANEOUS | 3 refills | Status: DC
  Filled 2023-07-27 – 2023-08-03 (×3): qty 15, 30d supply, fill #0
  Filled 2023-08-26 – 2023-08-28 (×2): qty 15, 30d supply, fill #1
  Filled 2023-09-05 – 2023-09-15 (×2): qty 15, 30d supply, fill #2
  Filled 2023-09-15: qty 6, 12d supply, fill #2
  Filled 2023-10-12 – 2023-10-18 (×2): qty 15, 30d supply, fill #3

## 2023-07-27 MED ORDER — ALBUTEROL SULFATE HFA 108 (90 BASE) MCG/ACT IN AERS
2.0000 | INHALATION_SPRAY | Freq: Four times a day (QID) | RESPIRATORY_TRACT | 5 refills | Status: DC | PRN
  Filled 2023-07-27 – 2023-08-03 (×3): qty 6.7, 25d supply, fill #0
  Filled 2023-10-06: qty 6.7, 25d supply, fill #1

## 2023-07-27 MED ORDER — FREESTYLE LIBRE 3 READER DEVI
1.0000 | Freq: Once | 0 refills | Status: AC
  Filled 2023-07-27: qty 1, 90d supply, fill #0
  Filled 2023-08-09 – 2023-08-10 (×2): qty 1, 30d supply, fill #0

## 2023-07-27 MED ORDER — OMEPRAZOLE 20 MG PO CPDR
20.0000 mg | DELAYED_RELEASE_CAPSULE | Freq: Two times a day (BID) | ORAL | 2 refills | Status: DC
  Filled 2023-07-27 – 2023-09-04 (×4): qty 60, 30d supply, fill #0
  Filled 2023-10-02: qty 60, 30d supply, fill #1
  Filled 2023-10-25: qty 60, 30d supply, fill #2

## 2023-07-27 MED ORDER — BUPROPION HCL ER (XL) 150 MG PO TB24
150.0000 mg | ORAL_TABLET | Freq: Every morning | ORAL | 0 refills | Status: DC
  Filled 2023-07-27 – 2023-08-10 (×4): qty 30, 30d supply, fill #0

## 2023-07-27 MED ORDER — SUMATRIPTAN SUCCINATE 100 MG PO TABS
ORAL_TABLET | ORAL | 0 refills | Status: DC
  Filled 2023-07-27 – 2023-08-10 (×3): qty 9, 30d supply, fill #0

## 2023-07-27 MED ORDER — LUBIPROSTONE 24 MCG PO CAPS
24.0000 ug | ORAL_CAPSULE | Freq: Two times a day (BID) | ORAL | 3 refills | Status: DC
  Filled 2023-07-27 – 2023-08-10 (×4): qty 60, 30d supply, fill #0
  Filled 2023-08-30 – 2023-09-04 (×2): qty 60, 30d supply, fill #1
  Filled 2023-10-02: qty 60, 30d supply, fill #2
  Filled 2023-10-25: qty 60, 30d supply, fill #3

## 2023-07-27 MED ORDER — FREESTYLE LIBRE 3 SENSOR MISC
1 refills | Status: DC
  Filled 2023-07-27: qty 6, 84d supply, fill #0
  Filled 2023-07-27: qty 6, fill #0
  Filled 2023-07-31 – 2023-08-03 (×2): qty 6, 84d supply, fill #0

## 2023-07-27 MED ORDER — ROPINIROLE HCL 0.25 MG PO TABS
0.2500 mg | ORAL_TABLET | Freq: Two times a day (BID) | ORAL | 2 refills | Status: DC
  Filled 2023-07-27 – 2023-08-10 (×3): qty 60, 30d supply, fill #0

## 2023-07-27 MED ORDER — IPRATROPIUM-ALBUTEROL 0.5-2.5 (3) MG/3ML IN SOLN
3.0000 mL | Freq: Two times a day (BID) | RESPIRATORY_TRACT | 2 refills | Status: AC | PRN
  Filled 2023-07-27 – 2023-08-10 (×5): qty 90, 15d supply, fill #0
  Filled 2023-08-30 – 2023-11-17 (×2): qty 90, 15d supply, fill #1

## 2023-07-27 MED ORDER — INSULIN GLARGINE 100 UNIT/ML SOLOSTAR PEN
50.0000 [IU] | PEN_INJECTOR | Freq: Every day | SUBCUTANEOUS | 1 refills | Status: DC
  Filled 2023-07-27 – 2023-11-17 (×6): qty 45, 90d supply, fill #0
  Filled 2024-02-14: qty 45, 90d supply, fill #1

## 2023-07-27 NOTE — Telephone Encounter (Signed)
Call for appt

## 2023-07-28 ENCOUNTER — Other Ambulatory Visit: Payer: Self-pay

## 2023-07-28 ENCOUNTER — Other Ambulatory Visit (HOSPITAL_COMMUNITY): Payer: Self-pay

## 2023-07-31 ENCOUNTER — Other Ambulatory Visit (HOSPITAL_COMMUNITY): Payer: Self-pay

## 2023-07-31 MED ORDER — LIDOCAINE 5 % EX PTCH
1.0000 | MEDICATED_PATCH | Freq: Every day | CUTANEOUS | 5 refills | Status: DC
Start: 2023-07-31 — End: 2024-06-20
  Filled 2023-07-31 – 2023-08-03 (×3): qty 30, 30d supply, fill #0
  Filled 2023-08-26 – 2023-09-05 (×2): qty 30, 30d supply, fill #1
  Filled 2023-09-28: qty 30, 30d supply, fill #2
  Filled 2023-10-28: qty 30, 30d supply, fill #3
  Filled 2023-11-27: qty 30, 30d supply, fill #4
  Filled 2023-12-22 – 2024-02-29 (×6): qty 30, 30d supply, fill #5

## 2023-07-31 MED ORDER — TIZANIDINE HCL 4 MG PO TABS
4.0000 mg | ORAL_TABLET | Freq: Two times a day (BID) | ORAL | 11 refills | Status: DC | PRN
  Filled 2023-07-31 – 2023-10-02 (×2): qty 60, 30d supply, fill #0

## 2023-07-31 MED ORDER — PREGABALIN 200 MG PO CAPS
200.0000 mg | ORAL_CAPSULE | Freq: Two times a day (BID) | ORAL | 2 refills | Status: DC
Start: 2023-07-31 — End: 2023-12-05
  Filled 2023-08-11: qty 60, 30d supply, fill #0

## 2023-08-01 ENCOUNTER — Encounter: Payer: Self-pay | Admitting: Nurse Practitioner

## 2023-08-01 ENCOUNTER — Other Ambulatory Visit: Payer: Self-pay

## 2023-08-01 LAB — COLOGUARD

## 2023-08-01 LAB — EXTERNAL GENERIC LAB PROCEDURE

## 2023-08-02 ENCOUNTER — Encounter: Payer: Self-pay | Admitting: Pharmacist

## 2023-08-02 ENCOUNTER — Other Ambulatory Visit (HOSPITAL_COMMUNITY): Payer: Self-pay

## 2023-08-02 ENCOUNTER — Other Ambulatory Visit: Payer: Self-pay

## 2023-08-02 DIAGNOSIS — M25512 Pain in left shoulder: Secondary | ICD-10-CM | POA: Diagnosis not present

## 2023-08-02 DIAGNOSIS — I10 Essential (primary) hypertension: Secondary | ICD-10-CM | POA: Diagnosis not present

## 2023-08-02 DIAGNOSIS — R269 Unspecified abnormalities of gait and mobility: Secondary | ICD-10-CM | POA: Diagnosis not present

## 2023-08-02 DIAGNOSIS — E1122 Type 2 diabetes mellitus with diabetic chronic kidney disease: Secondary | ICD-10-CM | POA: Diagnosis not present

## 2023-08-02 DIAGNOSIS — M5114 Intervertebral disc disorders with radiculopathy, thoracic region: Secondary | ICD-10-CM | POA: Diagnosis not present

## 2023-08-02 DIAGNOSIS — G894 Chronic pain syndrome: Secondary | ICD-10-CM | POA: Diagnosis not present

## 2023-08-02 DIAGNOSIS — D801 Nonfamilial hypogammaglobulinemia: Secondary | ICD-10-CM | POA: Diagnosis not present

## 2023-08-02 DIAGNOSIS — M797 Fibromyalgia: Secondary | ICD-10-CM | POA: Diagnosis not present

## 2023-08-02 DIAGNOSIS — R296 Repeated falls: Secondary | ICD-10-CM | POA: Diagnosis not present

## 2023-08-02 DIAGNOSIS — G43109 Migraine with aura, not intractable, without status migrainosus: Secondary | ICD-10-CM | POA: Diagnosis not present

## 2023-08-02 DIAGNOSIS — G2581 Restless legs syndrome: Secondary | ICD-10-CM | POA: Diagnosis not present

## 2023-08-02 DIAGNOSIS — G43909 Migraine, unspecified, not intractable, without status migrainosus: Secondary | ICD-10-CM | POA: Diagnosis not present

## 2023-08-03 ENCOUNTER — Other Ambulatory Visit: Payer: Self-pay

## 2023-08-03 ENCOUNTER — Other Ambulatory Visit (HOSPITAL_COMMUNITY): Payer: Self-pay

## 2023-08-04 ENCOUNTER — Other Ambulatory Visit: Payer: Self-pay

## 2023-08-04 ENCOUNTER — Other Ambulatory Visit (HOSPITAL_COMMUNITY): Payer: Self-pay

## 2023-08-06 ENCOUNTER — Other Ambulatory Visit (HOSPITAL_COMMUNITY): Payer: Self-pay

## 2023-08-06 MED ORDER — TIZANIDINE HCL 4 MG PO TABS
4.0000 mg | ORAL_TABLET | Freq: Every day | ORAL | 0 refills | Status: DC
  Filled 2023-08-09 – 2023-08-10 (×2): qty 30, 30d supply, fill #0
  Filled 2023-08-30 – 2023-09-04 (×2): qty 30, 30d supply, fill #1
  Filled 2023-10-04 – 2023-10-25 (×3): qty 30, 30d supply, fill #2
  Filled 2023-11-15 – 2023-11-20 (×3): qty 30, 30d supply, fill #3
  Filled 2023-12-05 – 2023-12-18 (×3): qty 30, 30d supply, fill #4
  Filled 2024-01-08 – 2024-01-10 (×2): qty 30, 30d supply, fill #5

## 2023-08-06 MED ORDER — OMEPRAZOLE 20 MG PO CPDR
20.0000 mg | DELAYED_RELEASE_CAPSULE | Freq: Two times a day (BID) | ORAL | 1 refills | Status: DC
  Filled 2023-08-09 – 2023-08-10 (×2): qty 60, 30d supply, fill #0

## 2023-08-06 MED ORDER — ROSUVASTATIN CALCIUM 40 MG PO TABS
40.0000 mg | ORAL_TABLET | Freq: Every day | ORAL | 1 refills | Status: DC
  Filled 2023-08-06 – 2023-08-10 (×2): qty 30, 30d supply, fill #0
  Filled 2023-08-30 – 2023-09-04 (×2): qty 30, 30d supply, fill #1
  Filled 2023-10-25: qty 30, 30d supply, fill #2
  Filled 2023-11-15 – 2023-11-20 (×3): qty 30, 30d supply, fill #3
  Filled 2023-12-05 – 2023-12-13 (×2): qty 30, 30d supply, fill #4

## 2023-08-06 MED ORDER — UBRELVY 100 MG PO TABS: 100.0000 mg | ORAL_TABLET | ORAL | 1 refills | Status: DC | PRN

## 2023-08-07 ENCOUNTER — Other Ambulatory Visit (HOSPITAL_COMMUNITY): Payer: Self-pay

## 2023-08-07 MED ORDER — ALBUTEROL SULFATE HFA 108 (90 BASE) MCG/ACT IN AERS
2.0000 | INHALATION_SPRAY | Freq: Four times a day (QID) | RESPIRATORY_TRACT | 5 refills | Status: DC | PRN
Start: 2023-07-10 — End: 2024-10-04
  Filled 2023-09-05 – 2023-09-06 (×2): qty 6.7, 25d supply, fill #0
  Filled 2023-11-17: qty 6.7, 25d supply, fill #1
  Filled 2024-03-20: qty 6.7, 25d supply, fill #2
  Filled 2024-04-29: qty 6.7, 25d supply, fill #3
  Filled 2024-06-12: qty 6.7, 25d supply, fill #4

## 2023-08-07 MED ORDER — INSULIN GLARGINE 100 UNIT/ML SOLOSTAR PEN
50.0000 [IU] | PEN_INJECTOR | Freq: Every evening | SUBCUTANEOUS | 3 refills | Status: DC
  Filled 2023-08-09 – 2023-11-27 (×5): qty 45, 90d supply, fill #0

## 2023-08-07 MED ORDER — EMGALITY 120 MG/ML ~~LOC~~ SOSY
1.0000 mL | PREFILLED_SYRINGE | SUBCUTANEOUS | 11 refills | Status: AC
Start: 2023-07-24 — End: ?
  Filled 2023-08-09: qty 1, 30d supply, fill #0
  Filled 2023-09-02: qty 1, 30d supply, fill #1
  Filled 2023-10-02: qty 1, 30d supply, fill #2
  Filled 2023-10-26: qty 1, 30d supply, fill #3
  Filled 2023-11-27: qty 1, 30d supply, fill #4
  Filled 2023-12-25: qty 1, 30d supply, fill #5
  Filled 2024-02-02: qty 1, 30d supply, fill #6
  Filled 2024-03-12: qty 1, 30d supply, fill #7
  Filled 2024-04-12: qty 1, 30d supply, fill #8
  Filled 2024-05-13: qty 1, 30d supply, fill #9
  Filled 2024-06-11: qty 1, 30d supply, fill #10
  Filled 2024-07-09: qty 1, 30d supply, fill #11

## 2023-08-07 MED ORDER — FREESTYLE LIBRE 3 SENSOR MISC
3 refills | Status: DC
  Filled 2023-10-06: qty 6, 84d supply, fill #0
  Filled 2023-11-17 – 2023-12-25 (×2): qty 6, 84d supply, fill #1
  Filled 2024-04-04 – 2024-05-15 (×7): qty 6, 84d supply, fill #2

## 2023-08-07 MED ORDER — PREGABALIN 200 MG PO CAPS
200.0000 mg | ORAL_CAPSULE | Freq: Two times a day (BID) | ORAL | 2 refills | Status: DC
  Filled 2023-09-05: qty 90, 45d supply, fill #0
  Filled 2023-09-08: qty 60, 30d supply, fill #0
  Filled 2023-09-08: qty 90, 45d supply, fill #0
  Filled 2023-10-12: qty 60, 30d supply, fill #1
  Filled 2023-11-14: qty 60, 30d supply, fill #2
  Filled 2023-12-09 – 2023-12-22 (×2): qty 30, 15d supply, fill #3

## 2023-08-07 MED ORDER — VYZULTA 0.024 % OP SOLN
1.0000 [drp] | Freq: Every evening | OPHTHALMIC | 3 refills | Status: DC
  Filled 2023-08-09: qty 7.5, 75d supply, fill #0
  Filled 2023-10-17: qty 7.5, 75d supply, fill #1
  Filled 2024-01-01 – 2024-02-27 (×2): qty 7.5, 75d supply, fill #2
  Filled 2024-02-29: qty 5, 50d supply, fill #3
  Filled 2024-06-21: qty 2.5, 25d supply, fill #3

## 2023-08-08 ENCOUNTER — Other Ambulatory Visit (HOSPITAL_COMMUNITY): Payer: Self-pay

## 2023-08-08 ENCOUNTER — Other Ambulatory Visit: Payer: Self-pay

## 2023-08-08 DIAGNOSIS — F319 Bipolar disorder, unspecified: Secondary | ICD-10-CM | POA: Diagnosis not present

## 2023-08-09 ENCOUNTER — Other Ambulatory Visit: Payer: Self-pay

## 2023-08-09 DIAGNOSIS — M25512 Pain in left shoulder: Secondary | ICD-10-CM | POA: Diagnosis not present

## 2023-08-09 DIAGNOSIS — M5114 Intervertebral disc disorders with radiculopathy, thoracic region: Secondary | ICD-10-CM | POA: Diagnosis not present

## 2023-08-10 ENCOUNTER — Other Ambulatory Visit (HOSPITAL_COMMUNITY): Payer: Self-pay

## 2023-08-10 ENCOUNTER — Other Ambulatory Visit: Payer: Self-pay

## 2023-08-11 ENCOUNTER — Other Ambulatory Visit (HOSPITAL_COMMUNITY): Payer: Self-pay

## 2023-08-11 ENCOUNTER — Other Ambulatory Visit (HOSPITAL_BASED_OUTPATIENT_CLINIC_OR_DEPARTMENT_OTHER): Payer: Self-pay

## 2023-08-11 ENCOUNTER — Other Ambulatory Visit: Payer: Self-pay

## 2023-08-11 ENCOUNTER — Other Ambulatory Visit (HOSPITAL_COMMUNITY): Payer: Self-pay | Admitting: Psychiatry

## 2023-08-11 ENCOUNTER — Telehealth (HOSPITAL_COMMUNITY): Payer: Self-pay

## 2023-08-11 DIAGNOSIS — M5114 Intervertebral disc disorders with radiculopathy, thoracic region: Secondary | ICD-10-CM | POA: Diagnosis not present

## 2023-08-11 DIAGNOSIS — M25512 Pain in left shoulder: Secondary | ICD-10-CM | POA: Diagnosis not present

## 2023-08-11 MED ORDER — CLONAZEPAM 0.5 MG PO TABS
ORAL_TABLET | ORAL | 2 refills | Status: DC
  Filled 2023-08-11: qty 90, 30d supply, fill #0

## 2023-08-11 MED ORDER — LAMOTRIGINE 25 MG PO TABS
25.0000 mg | ORAL_TABLET | Freq: Two times a day (BID) | ORAL | 2 refills | Status: DC
  Filled 2023-08-11: qty 60, 30d supply, fill #0

## 2023-08-11 NOTE — Telephone Encounter (Signed)
Pt called in requesting refills of lamoTRIgine (LAMICTAL) 25 MG tablet and clonazePAM (KLONOPIN) 0.5 MG tablet sent to [facility] . Pt's states that her old pharmacy shut down. Pt scheduled for 08/21/23. Please advise.

## 2023-08-11 NOTE — Telephone Encounter (Signed)
Spoke with pt advised rx has been sent to pharmacy pt verbalized understanding

## 2023-08-12 ENCOUNTER — Other Ambulatory Visit (HOSPITAL_COMMUNITY): Payer: Self-pay

## 2023-08-12 ENCOUNTER — Other Ambulatory Visit: Payer: Self-pay

## 2023-08-14 ENCOUNTER — Other Ambulatory Visit (HOSPITAL_COMMUNITY): Payer: Self-pay

## 2023-08-14 DIAGNOSIS — Z1211 Encounter for screening for malignant neoplasm of colon: Secondary | ICD-10-CM | POA: Diagnosis not present

## 2023-08-14 DIAGNOSIS — M5114 Intervertebral disc disorders with radiculopathy, thoracic region: Secondary | ICD-10-CM | POA: Diagnosis not present

## 2023-08-14 DIAGNOSIS — M25512 Pain in left shoulder: Secondary | ICD-10-CM | POA: Diagnosis not present

## 2023-08-16 ENCOUNTER — Other Ambulatory Visit (HOSPITAL_COMMUNITY): Payer: Self-pay

## 2023-08-16 MED ORDER — CYCLOSPORINE 0.05 % OP EMUL
1.0000 [drp] | Freq: Two times a day (BID) | OPHTHALMIC | 99 refills | Status: DC
  Filled 2023-08-16: qty 180, 90d supply, fill #0
  Filled 2023-10-26: qty 180, 90d supply, fill #1
  Filled 2024-02-06: qty 180, 90d supply, fill #2
  Filled 2024-04-29: qty 180, 90d supply, fill #3
  Filled 2024-07-16: qty 180, 90d supply, fill #4

## 2023-08-18 ENCOUNTER — Encounter (HOSPITAL_COMMUNITY): Payer: Self-pay | Admitting: Hematology & Oncology

## 2023-08-18 ENCOUNTER — Other Ambulatory Visit (HOSPITAL_COMMUNITY): Payer: Self-pay

## 2023-08-18 DIAGNOSIS — G43719 Chronic migraine without aura, intractable, without status migrainosus: Secondary | ICD-10-CM | POA: Diagnosis not present

## 2023-08-18 DIAGNOSIS — G4486 Cervicogenic headache: Secondary | ICD-10-CM | POA: Diagnosis not present

## 2023-08-18 LAB — EXTERNAL GENERIC LAB PROCEDURE: COLOGUARD: NEGATIVE

## 2023-08-18 LAB — COLOGUARD: COLOGUARD: NEGATIVE

## 2023-08-18 MED ORDER — ONDANSETRON 8 MG PO TBDP
ORAL_TABLET | ORAL | 6 refills | Status: DC
Start: 2023-08-18 — End: 2024-02-14
  Filled 2023-08-18: qty 20, 8d supply, fill #0
  Filled 2023-08-22: qty 20, 10d supply, fill #1
  Filled 2023-09-05: qty 20, 20d supply, fill #1
  Filled 2023-09-06: qty 20, 8d supply, fill #1
  Filled 2023-10-12: qty 20, 8d supply, fill #2
  Filled 2023-11-17: qty 20, 8d supply, fill #3
  Filled 2023-11-27: qty 20, 8d supply, fill #4
  Filled 2023-12-22: qty 20, 8d supply, fill #5
  Filled 2024-01-01: qty 20, 8d supply, fill #6

## 2023-08-21 ENCOUNTER — Other Ambulatory Visit: Payer: Self-pay

## 2023-08-21 ENCOUNTER — Encounter (HOSPITAL_COMMUNITY): Payer: Self-pay | Admitting: Psychiatry

## 2023-08-21 ENCOUNTER — Telehealth (INDEPENDENT_AMBULATORY_CARE_PROVIDER_SITE_OTHER): Payer: PPO | Admitting: Psychiatry

## 2023-08-21 ENCOUNTER — Other Ambulatory Visit (HOSPITAL_COMMUNITY): Payer: Self-pay

## 2023-08-21 DIAGNOSIS — F332 Major depressive disorder, recurrent severe without psychotic features: Secondary | ICD-10-CM | POA: Diagnosis not present

## 2023-08-21 DIAGNOSIS — F319 Bipolar disorder, unspecified: Secondary | ICD-10-CM

## 2023-08-21 MED ORDER — LAMOTRIGINE 25 MG PO TABS
25.0000 mg | ORAL_TABLET | Freq: Two times a day (BID) | ORAL | 2 refills | Status: DC
  Filled 2023-08-21 – 2023-09-06 (×4): qty 60, 30d supply, fill #0
  Filled 2023-09-27 – 2023-10-02 (×2): qty 60, 30d supply, fill #1
  Filled 2023-10-25: qty 60, 30d supply, fill #2

## 2023-08-21 MED ORDER — BUPROPION HCL ER (XL) 150 MG PO TB24
150.0000 mg | ORAL_TABLET | Freq: Every morning | ORAL | 2 refills | Status: DC
  Filled 2023-08-21 – 2023-09-04 (×4): qty 30, 30d supply, fill #0
  Filled 2023-10-02: qty 30, 30d supply, fill #1
  Filled 2023-10-25: qty 30, 30d supply, fill #2

## 2023-08-21 MED ORDER — TRAZODONE HCL 50 MG PO TABS
50.0000 mg | ORAL_TABLET | Freq: Every day | ORAL | 2 refills | Status: DC
  Filled 2023-08-21 – 2023-09-04 (×3): qty 30, 30d supply, fill #0
  Filled 2023-10-02: qty 30, 30d supply, fill #1
  Filled 2023-10-25: qty 30, 30d supply, fill #2

## 2023-08-21 MED ORDER — CLONAZEPAM 0.5 MG PO TABS
ORAL_TABLET | ORAL | 2 refills | Status: DC
  Filled 2023-08-21 – 2023-09-08 (×4): qty 90, 30d supply, fill #0
  Filled 2023-10-12: qty 90, 30d supply, fill #1

## 2023-08-21 MED ORDER — FLUOXETINE HCL 40 MG PO CAPS
40.0000 mg | ORAL_CAPSULE | Freq: Every day | ORAL | 2 refills | Status: DC
  Filled 2023-08-21 – 2023-09-04 (×3): qty 30, 30d supply, fill #0
  Filled 2023-10-02: qty 30, 30d supply, fill #1
  Filled 2023-10-25: qty 30, 30d supply, fill #2

## 2023-08-21 NOTE — Progress Notes (Signed)
Virtual Visit via Video Note  I connected with Michelle Clements on 08/21/23 at  9:00 AM EDT by a video enabled telemedicine application and verified that I am speaking with the correct person using two identifiers.  Location: Patient: home Provider: office   I discussed the limitations of evaluation and management by telemedicine and the availability of in person appointments. The patient expressed understanding and agreed to proceed.      I discussed the assessment and treatment plan with the patient. The patient was provided an opportunity to ask questions and all were answered. The patient agreed with the plan and demonstrated an understanding of the instructions.   The patient was advised to call back or seek an in-person evaluation if the symptoms worsen or if the condition fails to improve as anticipated.  I provided 20 minutes of non-face-to-face time during this encounter.   Diannia Ruder, MD  [facility] MD/PA/NP OP Progress Note  08/21/2023 9:21 AM Michelle Clements  MRN:  [id number]  Chief Complaint:  Chief Complaint  Patient presents with   Anxiety   Depression   Follow-up   HPI: This patient is a 57 year old married white female who lives with her husband in [place].  She has 2 grown daughters.  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  The patient returns for follow-up after about 5 months.  She is still struggling with some of the medical issues.  She is now on a program to get Botox shots for her chronic headaches.  She had been falling a lot and her primary doctor stopped Back the gabapentin and stopped the Mirapex and now she is swallowing much less.  She states her mood is generally good but she is more anxious lately.  She is going to start watching her 2-1/59-month old grandson while her daughter returns to work.  She is very anxious about this but I reminded her she has experience of training in these areas.  She denies significant depression or thoughts of self-harm.   Most of the time she sleeps fairly well. Visit Diagnosis:    ICD-10-CM   1. Bipolar 1 disorder (HCC)  F31.9     2. 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 She was also admitted for detox several years ago   Past Medical History:  Past Medical History:  Diagnosis Date   Allergic rhinitis    Anemia    Arthritis    Lower back and hips   Asthma    Bipolar disorder (HCC)    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HCC)    Constipation    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HCC)    Depression    Essential hypertension, benign    Fibromyalgia    GERD (gastroesophageal reflux disease)    H/O sleep apnea    Hip pain, left    History of palpitations    Negative Holter monitor   History of pneumonia 1988   Hyperlipidemia    IBS (irritable bowel syndrome)    Neck pain    Poor short term memory    PTSD (post-traumatic stress disorder)    S/P Botox injection 11/2014    For migraine headaches   Type 2 diabetes mellitus (HCC)    Urgency of urination     Past Surgical History:  Procedure Laterality Date   CARPAL TUNNEL RELEASE Right  06/10/2013   Procedure: CARPAL TUNNEL RELEASE;  Surgeon: Reinaldo Meeker, MD;  Location: [facility];  Service: Neurosurgery;  Laterality: Right;  Right Carpal Tunnel Release    CHOLECYSTECTOMY     DENTAL SURGERY  12/2014   mole exc     x 3   MUSCLE BIOPSY  2008   Right leg   TUBAL LIGATION      Family Psychiatric History: See below  Family History:  Family History  Problem Relation Age of Onset   Fibromyalgia Mother    Diabetes type II Mother    Depression Mother    Alzheimer's disease Mother    GER disease Mother    Heart disease Mother    Paranoid behavior Mother    Dementia Mother    Heart attack Father 69       MI x5   Stroke Father        CVA x7   Asthma Father    Brain  cancer Father    Seizures Father    Anxiety disorder Sister    OCD Sister    Sexual abuse Sister    Physical abuse Sister    Anxiety disorder Sister    ADD / ADHD Daughter    ADD / ADHD Daughter    Alcohol abuse Neg Hx    Drug abuse Neg Hx    Schizophrenia Neg Hx     Social History:  Social History   Socioeconomic History   Marital status: Married    Spouse name: Not on file   Number of children: 2   Years of education: College   Highest education level: Associate degree: academic program  Occupational History   Occupation: Chiropractor: UNEMPLOYED    Comment: Now disabled due to bipolar and fibromyalgia  Tobacco Use   Smoking status: Former    Current packs/day: 0.00    Types: Cigarettes    Quit date: 11/04/2010    Years since quitting: 12.8   Smokeless tobacco: Never  Vaping Use   Vaping status: Never Used  Substance and Sexual Activity   Alcohol use: Yes    Comment: one mixed drink per month   Drug use: No   Sexual activity: Yes    Birth control/protection: Surgical  Other Topics Concern   Not on file  Social History Narrative   Married   Lives with spouse and daughter   Right handed.   Caffeine use: 2 cups coffee in morning and 2 cups tea during the day    Social Determinants of Health   Financial Resource Strain: Low Risk  (07/27/2021)   Overall Financial Resource Strain (CARDIA)    Difficulty of Paying Living Expenses: Not very hard  Food Insecurity: No Food Insecurity (07/27/2021)   Hunger Vital Sign    Worried About Running Out of Food in the Last Year: Never true    Ran Out of Food in the Last Year: Never true  Transportation Needs: No Transportation Needs (07/27/2021)   PRAPARE - Administrator, Civil Service (Medical): No    Lack of Transportation (Non-Medical): No  Physical Activity: Inactive (07/27/2021)   Exercise Vital Sign    Days of Exercise per Week: 0 days    Minutes of Exercise per Session: 0 min  Stress: No Stress  Concern Present (07/27/2021)   [employer] of Occupational Health - Occupational Stress Questionnaire    Feeling of Stress : Not at all  Social Connections: Unknown (04/18/2022)  [facility]   Social Network    Social Network: Not on file    Allergies:  Allergies  Allergen Reactions   Molds & Smuts Shortness Of Breath    wheezing   Other Anaphylaxis, Shortness Of Breath and Other (See Comments)     : Angioedema wheezing Wheezing wheezing Other reaction(s): Cough Wheezing Itching and rash Wheezing wheezing Wheezing wheezing Other reaction(s): Other (See Comments)  : Angioedema wheezing Wheezing wheezing Other reaction(s): Cough Wheezing Itching and rash Wheezing wheezing Other reaction(s): Other (See Comments) Wheezing wheezing  : Angioedema wheezing Wheezing wheezing Other reaction(s): Cough Wheezing Itching and rash Wheezing wheezing Wheezing wheezing   Sulfa Antibiotics Anaphylaxis     : Angioedema    Sulfamethoxazole-Trimethoprim Anaphylaxis   Sulfonamide Derivatives Anaphylaxis     : Angioedema   Trichophyton Shortness Of Breath    wheezing wheezing wheezing wheezing wheezing wheezing wheezing wheezing wheezing   Carbamazepine Rash   Dust Mite Extract Cough and Other (See Comments)    Wheezing Other reaction(s): Wheezing Wheezing Wheezing Other reaction(s): Wheezing Wheezing Wheezing Other reaction(s): Wheezing Wheezing   Gluten Meal Itching   Clonazepam Other (See Comments)    Confusion and memory loss Caused nconfusion   Quetiapine Fumarate Other (See Comments)    Metabolic Disorder Labs: Lab Results  Component Value Date   HGBA1C 6.5 (A) 07/05/2023   MPG 131 03/18/2015   No results found for: "PROLACTIN" Lab Results  Component Value Date   CHOL 196 08/06/2021   TRIG 199 (A) 03/31/2022   HDL 54 08/06/2021   CHOLHDL 4.2 03/18/2015   VLDL 44 (H) 03/18/2015   LDLCALC 126 03/31/2022    LDLCALC 117 08/06/2021   Lab Results  Component Value Date   TSH 1.920 02/03/2023   TSH 2.69 12/29/2020    Therapeutic Level Labs: Lab Results  Component Value Date   LITHIUM 0.20 (L) 02/21/2012   No results found for: "VALPROATE" No results found for: "CBMZ"  Current Medications: Current Outpatient Medications  Medication Sig Dispense Refill   albuterol (VENTOLIN HFA) 108 (90 Base) MCG/ACT inhaler albuterol sulfate HFA 90 mcg/actuation aerosol inhaler  INHALE TWO PUFFS BY MOUTH INTO LUNGS EVERY 6 HOURS AS NEEDED     albuterol (VENTOLIN HFA) 108 (90 Base) MCG/ACT inhaler Inhale 2 puffs into the lungs every 6 (six) hours as needed FOR SHORTNESS OF BREATH AND WHEEZING 6.7 g 5   albuterol (VENTOLIN HFA) 108 (90 Base) MCG/ACT inhaler Inhale 2 puffs into the lungs every 6 (six) hours as needed for wheezing and/or shortness of breath. 18 g 5   aspirin EC 81 MG tablet Take 1 tablet every day by oral route.     azelastine (ASTELIN) 0.1 % nasal spray Place 1 spray into both nostrils 2 (two) times daily. 30 mL 2   Blood Glucose Monitoring Suppl (ONETOUCH VERIO FLEX SYSTEM) w/Device KIT Use as directed to check blood glucose twice daily, before breakfast and before bed. 1 kit 0   buPROPion (WELLBUTRIN XL) 150 MG 24 hr tablet Take 1 tablet (150 mg total) by mouth every morning. 30 tablet 2   cetirizine (ZYRTEC) 10 MG tablet Take 10 mg by mouth at bedtime.     Cholecalciferol (VITAMIN D) 50 MCG (2000 UT) tablet Take 2,000 Units by mouth daily.     clonazePAM (KLONOPIN) 0.5 MG tablet Take 1 tablet (0.5 mg total) every morning AND 2 tablets (1 mg total) at bedtime. 90 tablet 2   Continuous Glucose Sensor (  FREESTYLE LIBRE 3 SENSOR) MISC Change sensor every 14 days 6 each 1   Continuous Glucose Sensor (FREESTYLE LIBRE 3 SENSOR) MISC Change sensor every 14 days 6 each 1   Continuous Glucose Sensor (FREESTYLE LIBRE 3 SENSOR) MISC Apply new sensor every 14 days to monitor blood glucose. Remove old  sensor before applying new one. 6 each 3   cycloSPORINE (RESTASIS) 0.05 % ophthalmic emulsion Apply to eye.     cycloSPORINE (RESTASIS) 0.05 % ophthalmic emulsion Place 1 drop into both eyes 2 (two) times daily. 180 each 99   estradiol (ESTRACE) 0.1 MG/GM vaginal cream Place vaginally.     FLUoxetine (PROZAC) 40 MG capsule Take 1 capsule (40 mg total) by mouth daily. 30 capsule 2   fluticasone (FLONASE) 50 MCG/ACT nasal spray Place 1 spray into both nostrils daily.     Galcanezumab-gnlm (EMGALITY) 120 MG/ML SOSY Inject 1 mL into the skin every 30 (thirty) days. 1 mL 11   Glucos-Chond-Hyal Ac-Ca Fructo (MOVE FREE JOINT HEALTH ADVANCE PO) Take by mouth.     Immune Globulin, Human,-klhw (XEMBIFY) 10 GM/50ML SOLN Inject 10 g into the skin every 7 (seven) days. 200 mL 3   insulin glargine (LANTUS SOLOSTAR) 100 UNIT/ML Solostar Pen Inject 50 Units into the skin daily. 15 mL 3   insulin glargine (LANTUS) 100 UNIT/ML Solostar Pen Inject 50 Units into the skin at bedtime. 45 mL 1   insulin glargine (LANTUS) 100 UNIT/ML Solostar Pen Inject 50 Units into the skin at bedtime. 45 mL 3   ipratropium-albuterol (DUONEB) 0.5-2.5 (3) MG/3ML SOLN Take 3 mLs by nebulization 2 (two) times daily as needed. 90 mL 2   lamoTRIgine (LAMICTAL) 25 MG tablet Take 1 tablet (25 mg total) by mouth 2 (two) times daily. 60 tablet 2   Lancets (ONETOUCH ULTRASOFT) lancets 1 each by Other route as needed.      Latanoprostene Bunod (VYZULTA) 0.024 % SOLN Place 1 drop into both eyes at bedtime as directed 7.5 mL 3   lidocaine (LIDODERM) 5 % lidocaine 5 % topical patch  APPLY 1 PATCH BY TOPICAL ROUTE ONCE DAILY (MAY WEAR UP TO 12HOURS.)     lidocaine (LIDODERM) 5 % Place 1 patch onto the skin daily, may wear up to 12 hours 30 patch 5   lubiprostone (AMITIZA) 24 MCG capsule Take 24 mcg by mouth 2 (two) times daily.     lubiprostone (AMITIZA) 24 MCG capsule Take 1 capsule (24 mcg total) by mouth 2 (two) times daily. 60 capsule 3    Melatonin 10 MG TABS Take 1 tablet every day by oral route at bedtime.     Multiple Vitamin (MULTIVITAMIN) tablet Take 1 tablet by mouth daily.     omeprazole (PRILOSEC) 20 MG capsule Take 20 mg by mouth daily.     omeprazole (PRILOSEC) 20 MG capsule Take 1 capsule (20 mg total) by mouth 2 (two) times daily. 60 capsule 2   omeprazole (PRILOSEC) 20 MG capsule Take 1 capsule (20 mg total) by mouth 2 (two) times daily. 60 capsule 1   ondansetron (ZOFRAN-ODT) 8 MG disintegrating tablet Dissolve 1 tablet (8 mg total) on tongue every 8 (eight) hours as needed for nausea or vomiting. 20 tablet 6   ONETOUCH VERIO test strip 4 (four) times daily.     pregabalin (LYRICA) 200 MG capsule Take 1 capsule (200 mg total) by mouth 2 (two) times daily. 60 capsule 2   pregabalin (LYRICA) 200 MG capsule Take 1 capsule (200 mg total)  by mouth 2 (two) times daily. 90 capsule 2   rosuvastatin (CRESTOR) 40 MG tablet Take 1 tablet (40 mg total) by mouth at bedtime. 90 tablet 1   rosuvastatin (CRESTOR) 40 MG tablet Take 1 tablet (40 mg total) by mouth at bedtime. 90 tablet 1   sennosides-docusate sodium (SENOKOT-S) 8.6-50 MG tablet Take 1 tablet by mouth daily.     tiZANidine (ZANAFLEX) 4 MG tablet Take 4 mg by mouth daily. 4mg  at bedtime and 2 mg during the day     tiZANidine (ZANAFLEX) 4 MG tablet Take 1 tablet (4 mg total) by mouth every 12 (twelve) hours as needed. 60 tablet 11   tiZANidine (ZANAFLEX) 4 MG tablet Take 1 tablet (4 mg total) by mouth at bedtime. 480 tablet 0   traZODone (DESYREL) 50 MG tablet Take 1 tablet (50 mg total) by mouth at bedtime. 30 tablet 2   Ubrogepant (UBRELVY) 100 MG TABS Take 1 tablet (100 mg total) by mouth as needed. 16 tablet 1   zinc gluconate 50 MG tablet Take 50 mg by mouth daily.     No current facility-administered medications for this visit.     Musculoskeletal: Strength & Muscle Tone: within normal limits Gait & Station: unsteady Patient leans: N/A  Psychiatric  Specialty Exam: Review of Systems  Musculoskeletal:  Positive for gait problem.  Neurological:  Positive for headaches.  All other systems reviewed and are negative.   Last menstrual period 01/04/2018.There is no height or weight on file to calculate BMI.  General Appearance: Casual and Fairly Groomed  Eye Contact:  Good  Speech:  Clear and Coherent  Volume:  Normal  Mood:  Anxious and Euthymic  Affect:  Congruent  Thought Process:  Goal Directed  Orientation:  Full (Time, Place, and Person)  Thought Content: Rumination   Suicidal Thoughts:  No  Homicidal Thoughts:  No  Memory:  Immediate;   Good Recent;   Good Remote;   NA  Judgement:  Good  Insight:  Fair  Psychomotor Activity:  Decreased  Concentration:  Concentration: Fair and Attention Span: Fair  Recall:  Fair  Fund of Knowledge: Good  Language: Good  Akathisia:  No  Handed:  Right  AIMS (if indicated): not done  Assets:  Communication Skills Desire for Improvement Resilience Social Support  ADL's:  Intact  Cognition: WNL  Sleep:  Fair   Screenings: Mini-Mental    Flowsheet Row Office Visit from 08/29/2019 in [facility]  Total Score (max 30 points ) 27      PHQ2-9    Flowsheet Row Video Visit from 11/04/2022 in [facility] [place] Video Visit from 08/23/2022 in [facility] [place] Video Visit from 06/20/2022 in [facility] at [place] Video Visit from 05/20/2022 in [facility] at [place] Video Visit from 03/16/2022 in [facility] Total Score 2 0 0 2 0  PHQ-9 Total Score 10 -- -- 7 0      Flowsheet Row Video Visit from 11/04/2022 in [facility] [place] Video Visit from 08/23/2022 in [facility] at [place] Video Visit from 06/20/2022 in  [facility] at [facility] RISK CATEGORY Error: Question 6 not populated No Risk No Risk        Assessment and Plan: This patient is a 57 year old female with a history of  PTSD possible bipolar disorder depression anxiety and hallucinations in the past.  She seems to be doing fairly well in terms of her mental health.  She will continue Wellbutrin XL 150 mg daily as well as Prozac 40 mg daily for depression, trazodone 50 mg at bedtime for sleep, clonazepam 0.5 mg in the morning and 1 mg at bedtime for anxiety and Lamictal 25 mg twice daily for mood swings.  She will return to see me in 3 months  Collaboration of Care: Collaboration of Care: Primary Care Provider AEB notes will be shared with PCP at patient's request  Patient/Guardian was advised Release of Information must be obtained prior to any record release in order to collaborate their care with an outside provider. Patient/Guardian was advised if they have not already done so to contact the registration department to sign all necessary forms in order for [place] to release information regarding their care.   Consent: Patient/Guardian gives verbal consent for treatment and assignment of benefits for services provided during this visit. Patient/Guardian expressed understanding and agreed to proceed.    Diannia Ruder, MD 08/21/2023, 9:21 AM

## 2023-08-22 ENCOUNTER — Other Ambulatory Visit (HOSPITAL_COMMUNITY): Payer: Self-pay

## 2023-08-22 ENCOUNTER — Encounter (HOSPITAL_COMMUNITY): Payer: Self-pay | Admitting: Hematology & Oncology

## 2023-08-26 ENCOUNTER — Other Ambulatory Visit (HOSPITAL_COMMUNITY): Payer: Self-pay

## 2023-08-28 ENCOUNTER — Other Ambulatory Visit: Payer: Self-pay

## 2023-08-29 ENCOUNTER — Other Ambulatory Visit (HOSPITAL_COMMUNITY): Payer: Self-pay

## 2023-08-29 ENCOUNTER — Other Ambulatory Visit: Payer: Self-pay

## 2023-08-30 ENCOUNTER — Other Ambulatory Visit (HOSPITAL_COMMUNITY): Payer: Self-pay

## 2023-08-30 ENCOUNTER — Other Ambulatory Visit: Payer: Self-pay

## 2023-09-04 ENCOUNTER — Other Ambulatory Visit: Payer: Self-pay

## 2023-09-04 ENCOUNTER — Other Ambulatory Visit (HOSPITAL_COMMUNITY): Payer: Self-pay

## 2023-09-05 ENCOUNTER — Other Ambulatory Visit: Payer: Self-pay

## 2023-09-05 ENCOUNTER — Other Ambulatory Visit (HOSPITAL_COMMUNITY): Payer: Self-pay

## 2023-09-06 ENCOUNTER — Other Ambulatory Visit (HOSPITAL_COMMUNITY): Payer: Self-pay

## 2023-09-06 ENCOUNTER — Encounter (HOSPITAL_COMMUNITY): Payer: Self-pay | Admitting: Hematology & Oncology

## 2023-09-06 ENCOUNTER — Other Ambulatory Visit: Payer: Self-pay

## 2023-09-07 ENCOUNTER — Other Ambulatory Visit (HOSPITAL_COMMUNITY): Payer: Self-pay

## 2023-09-08 ENCOUNTER — Other Ambulatory Visit (HOSPITAL_COMMUNITY): Payer: Self-pay

## 2023-09-12 DIAGNOSIS — E119 Type 2 diabetes mellitus without complications: Secondary | ICD-10-CM | POA: Diagnosis not present

## 2023-09-12 DIAGNOSIS — H18513 Endothelial corneal dystrophy, bilateral: Secondary | ICD-10-CM | POA: Diagnosis not present

## 2023-09-12 DIAGNOSIS — H04123 Dry eye syndrome of bilateral lacrimal glands: Secondary | ICD-10-CM | POA: Diagnosis not present

## 2023-09-12 DIAGNOSIS — D801 Nonfamilial hypogammaglobulinemia: Secondary | ICD-10-CM | POA: Diagnosis not present

## 2023-09-12 DIAGNOSIS — H43813 Vitreous degeneration, bilateral: Secondary | ICD-10-CM | POA: Diagnosis not present

## 2023-09-14 DIAGNOSIS — M47816 Spondylosis without myelopathy or radiculopathy, lumbar region: Secondary | ICD-10-CM | POA: Diagnosis not present

## 2023-09-15 ENCOUNTER — Encounter (HOSPITAL_COMMUNITY): Payer: Self-pay | Admitting: Hematology & Oncology

## 2023-09-15 ENCOUNTER — Other Ambulatory Visit: Payer: Self-pay

## 2023-09-15 ENCOUNTER — Other Ambulatory Visit (HOSPITAL_COMMUNITY): Payer: Self-pay

## 2023-09-19 DIAGNOSIS — D801 Nonfamilial hypogammaglobulinemia: Secondary | ICD-10-CM | POA: Diagnosis not present

## 2023-09-24 DIAGNOSIS — A5901 Trichomonal vulvovaginitis: Secondary | ICD-10-CM | POA: Diagnosis not present

## 2023-09-24 DIAGNOSIS — Z6833 Body mass index (BMI) 33.0-33.9, adult: Secondary | ICD-10-CM | POA: Diagnosis not present

## 2023-09-24 DIAGNOSIS — B009 Herpesviral infection, unspecified: Secondary | ICD-10-CM | POA: Diagnosis not present

## 2023-09-24 DIAGNOSIS — A5400 Gonococcal infection of lower genitourinary tract, unspecified: Secondary | ICD-10-CM | POA: Diagnosis not present

## 2023-09-24 DIAGNOSIS — A749 Chlamydial infection, unspecified: Secondary | ICD-10-CM | POA: Diagnosis not present

## 2023-09-24 DIAGNOSIS — E669 Obesity, unspecified: Secondary | ICD-10-CM | POA: Diagnosis not present

## 2023-09-24 DIAGNOSIS — R030 Elevated blood-pressure reading, without diagnosis of hypertension: Secondary | ICD-10-CM | POA: Diagnosis not present

## 2023-09-24 DIAGNOSIS — N390 Urinary tract infection, site not specified: Secondary | ICD-10-CM | POA: Diagnosis not present

## 2023-09-26 DIAGNOSIS — D801 Nonfamilial hypogammaglobulinemia: Secondary | ICD-10-CM | POA: Diagnosis not present

## 2023-09-27 ENCOUNTER — Other Ambulatory Visit (HOSPITAL_COMMUNITY): Payer: Self-pay

## 2023-09-29 DIAGNOSIS — G894 Chronic pain syndrome: Secondary | ICD-10-CM | POA: Diagnosis not present

## 2023-10-01 NOTE — Progress Notes (Unsigned)
[facility] [address], [place] [address]   CLINIC:  Medical Oncology/Hematology  PCP:  Benita Stabile, MD [address] [place] [address] [phone]   REASON FOR VISIT:  Follow-up for hypogammaglobulinemia  PRIOR THERAPY: Monthly IVIG  CURRENT THERAPY: Subcutaneous IgG Richardean Sale )  INTERVAL HISTORY:   Ms. Felt 57 y.o. female returns for routine follow-up of hypogammaglobulinemia.  She was last seen by Rojelio Brenner PA-C on 06/21/2023.    She reports that she has been feeling somewhat poorly over the last several weeks due to body aches, headaches, fatigue, and anxiety/depression.  Overall though, she continues to feel that she is tolerating Xembify fairly well.  She continues to have side effects of fatigue, body aches, and headaches for 24 to 48 hours after each infusion, as well as diarrhea indicators on the second day after infusion.  She has some mild redness at the site of each injection, but this resolves within 1 to 2 days.  Overall, she feels that the symptoms are less severe than they were when she was taking IVIG, and feels that her quality of life has improved after switching to subcutaneous IgG.  She reports excellent hydration status.  She has ongoing joint pain that she attributes to her back and neck issues and fibromyalgia. She has not had the severe weakness that she was experiencing after her IVIG.   She was treated with antibiotics for UTI 1 week ago, but denies any other infections or antibiotics in the past 3 months. She denies any B symptoms.  Of history uses the medicines  She has 25% energy and 40% appetite. She has lost 10 pounds in the past 3 months, which she attributes to poor appetite.  ASSESSMENT & PLAN:  1.  Hypogammaglobulinemia with recurrent infections - Labs from October 2015 showed IgG 566 [id number]), IgM 28 (57-237), IgA 65, IgE 12 - Evaluated by [facility] by Dr. Ellie Lunch (10/27/2014), who noted: "  A review of Ms. Luttrull immune evaluation reveals mild hypogammaglobulinemia (not significant), a low IgM but normal specific antibody titers, normal B cell subsets panels and normal cellular function. Taken together the immune evaluation is normal. I do not believe Ms. Paradise has a functional disorder of her immune system. For the hypo-IgM this may become significant if the IgM was less that 15. For this I suggest she have an annual IgG, IgM and IgA. Currently the IgM level is not likely to be significant". 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 Leonides Schanz, with first dose on 11/03/2021 -Treated for UTI last week.  Otherwise, no infections or antibiotics over the past 3 months.  No B symptoms. - She reported increasing weakness, fatigue, nausea, and myalgia/arthralgia after IVIG infusions (Gamunex-C 40 g/400 mL q 4 weeks). - Switched to subcutaneous IgG (Xembify 10g/15 mL SQ weekly) in June 2024, which she is tolerating better, although she does continue to have side effects of fatigue, headache, and diarrhea - Most recent labs from [facility] (06/27/2023) have been reviewed: IgG at therapeutic goal (>600) with IgG 795.  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  Creatinine 1.16/GFR 56, stable compared to April 2024. CBC/D within normal limits. - Patient receives her home IVIG infusions via [facility] (contact [place] Sandy at [phone], fax  [phone], email [email or web address]) - PLAN: Will check labs today (Xembify infusion due tomorrow) with CBC/D, CMP, immunoglobulins - Therapeutic goal is IgG around 600. - We will check quantitative immunoglobulins, CBC/D, and CMP in 3 months (the day prior to infusion) - OFFICE visit in 3 months - Educational information regarding common  side effects and risks has been provided to patient.  We discussed the importance of hydration due to risk of kidney injury.  2.  Other history - Her past medical history is otherwise notable for fatty liver disease with history of transaminitis, GERD, fibromyalgia, type 2 diabetes mellitus, and IBS  PLAN SUMMARY:  >> Labs to be obtained at [facility] (in 1 week, then again in 3 months) = CBC/D, CMP, immunoglobulins >> OFFICE visit in 3 months     REVIEW OF SYSTEMS:   Review of Systems  Constitutional:  Positive for fatigue. Negative for appetite change, chills, diaphoresis, fever and unexpected weight change.  HENT:   Positive for trouble swallowing. Negative for lump/mass and nosebleeds.   Eyes:  Negative for eye problems.  Respiratory:  Positive for shortness of breath (With exertion). Negative for chest tightness, cough, hemoptysis and wheezing (asthma).   Cardiovascular:  Negative for chest pain, leg swelling and palpitations.  Gastrointestinal:  Positive for constipation (IBS), diarrhea (IBS) and nausea. Negative for abdominal pain, blood in stool and vomiting.  Genitourinary:  Negative for hematuria.   Musculoskeletal:  Positive for arthralgias, back pain, myalgias and neck pain.  Skin: Negative.   Neurological:  Positive for headaches (daily) and numbness. Negative for dizziness and light-headedness.  Hematological:  Does not bruise/bleed easily.  Psychiatric/Behavioral:  Positive for depression. Negative for sleep disturbance. The patient is nervous/anxious.      PHYSICAL EXAM:  ECOG PERFORMANCE STATUS: 1 - Symptomatic but completely ambulatory  Vitals:   10/02/23 0919  BP: 119/65  Pulse: 64  Resp: 18  Temp: 98.4 F (36.9 C)  SpO2: 99%   Filed Weights   10/02/23 0919  Weight: 240 lb 6.4 oz (109 kg)   Physical Exam Constitutional:      Appearance: Normal appearance. She is obese.  Cardiovascular:     Heart sounds: Normal heart sounds.  Pulmonary:     Breath  sounds: Normal breath sounds.  Neurological:     General: No focal deficit present.     Mental Status: Mental status is at baseline.  Psychiatric:        Behavior: Behavior normal. Behavior is cooperative.    PAST MEDICAL/SURGICAL HISTORY:  Past Medical History:  Diagnosis Date   Allergic rhinitis    Anemia    Arthritis    Lower back and hips   Asthma    Bipolar disorder (HCC)    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HCC)    Constipation    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HCC)    Depression    Essential hypertension, benign    Fibromyalgia    GERD (gastroesophageal reflux disease)    H/O sleep apnea    Hip pain, left    History of palpitations    Negative Holter monitor   History of pneumonia 1988   Hyperlipidemia    IBS (irritable bowel syndrome)    Neck pain    Poor short term memory    PTSD (post-traumatic stress disorder)    S/P Botox injection 11/2014    For migraine headaches   Type 2 diabetes mellitus (HCC)    Urgency of urination    Past Surgical History:  Procedure Laterality  Date   CARPAL TUNNEL RELEASE Right 06/10/2013   Procedure: CARPAL TUNNEL RELEASE;  Surgeon: Reinaldo Meeker, MD;  Location: [facility];  Service: Neurosurgery;  Laterality: Right;  Right Carpal Tunnel Release    CHOLECYSTECTOMY     DENTAL SURGERY  12/2014   mole exc     x 3   MUSCLE BIOPSY  2008   Right leg   TUBAL LIGATION      SOCIAL HISTORY:  Social History   Socioeconomic History   Marital status: Married    Spouse name: Not on file   Number of children: 2   Years of education: College   Highest education level: Associate degree: academic program  Occupational History   Occupation: Chiropractor: UNEMPLOYED    Comment: Now disabled due to bipolar and fibromyalgia  Tobacco Use   Smoking status: Former    Current packs/day: 0.00    Types: Cigarettes    Quit date: 11/04/2010    Years since quitting: 12.9   Smokeless tobacco: Never  Vaping Use   Vaping  status: Never Used  Substance and Sexual Activity   Alcohol use: Yes    Comment: one mixed drink per month   Drug use: No   Sexual activity: Yes    Birth control/protection: Surgical  Other Topics Concern   Not on file  Social History Narrative   Married   Lives with spouse and daughter   Right handed.   Caffeine use: 2 cups coffee in morning and 2 cups tea during the day    Social Determinants of Health   Financial Resource Strain: Low Risk  (07/27/2021)   Overall Financial Resource Strain (CARDIA)    Difficulty of Paying Living Expenses: Not very hard  Food Insecurity: No Food Insecurity (07/27/2021)   Hunger Vital Sign    Worried About Running Out of Food in the Last Year: Never true    Ran Out of Food in the Last Year: Never true  Transportation Needs: No Transportation Needs (07/27/2021)   PRAPARE - Administrator, Civil Service (Medical): No    Lack of Transportation (Non-Medical): No  Physical Activity: Inactive (07/27/2021)   Exercise Vital Sign    Days of Exercise per Week: 0 days    Minutes of Exercise per Session: 0 min  Stress: No Stress Concern Present (07/27/2021)   [employer] of Occupational Health - Occupational Stress Questionnaire    Feeling of Stress : Not at all  Social Connections: Unknown (04/18/2022)   Received from [facility]   Social Network    Social Network: Not on file  Intimate Partner Violence: Unknown (03/10/2022)   Received from [facility]   HITS    Physically Hurt: Not on file    Insult or Talk Down To: Not on file    Threaten Physical Harm: Not on file    Scream or Curse: Not on file    FAMILY HISTORY:  Family History  Problem Relation Age of Onset   Fibromyalgia Mother    Diabetes type II Mother    Depression Mother    Alzheimer's disease Mother    GER disease Mother    Heart disease Mother    Paranoid behavior Mother    Dementia Mother    Heart attack Father 24       MI x5    Stroke Father        CVA x7   Asthma Father  Brain cancer Father    Seizures Father    Anxiety disorder Sister    OCD Sister    Sexual abuse Sister    Physical abuse Sister    Anxiety disorder Sister    ADD / ADHD Daughter    ADD / ADHD Daughter    Alcohol abuse Neg Hx    Drug abuse Neg Hx    Schizophrenia Neg Hx     CURRENT MEDICATIONS:  Outpatient Encounter Medications as of 10/02/2023  Medication Sig Note   albuterol (VENTOLIN HFA) 108 (90 Base) MCG/ACT inhaler albuterol sulfate HFA 90 mcg/actuation aerosol inhaler  INHALE TWO PUFFS BY MOUTH INTO LUNGS EVERY 6 HOURS AS NEEDED    albuterol (VENTOLIN HFA) 108 (90 Base) MCG/ACT inhaler Inhale 2 puffs into the lungs every 6 (six) hours as needed FOR SHORTNESS OF BREATH AND WHEEZING    albuterol (VENTOLIN HFA) 108 (90 Base) MCG/ACT inhaler Inhale 2 puffs into the lungs every 6 (six) hours as needed for wheezing and/or shortness of breath.    aspirin EC 81 MG tablet Take 1 tablet every day by oral route.    azelastine (ASTELIN) 0.1 % nasal spray Place 1 spray into both nostrils 2 (two) times daily.    Blood Glucose Monitoring Suppl (ONETOUCH VERIO FLEX SYSTEM) w/Device KIT Use as directed to check blood glucose twice daily, before breakfast and before bed.    buPROPion (WELLBUTRIN XL) 150 MG 24 hr tablet Take 1 tablet (150 mg total) by mouth every morning.    cetirizine (ZYRTEC) 10 MG tablet Take 10 mg by mouth at bedtime.    Cholecalciferol (VITAMIN D) 50 MCG (2000 UT) tablet Take 2,000 Units by mouth daily.    clonazePAM (KLONOPIN) 0.5 MG tablet Take 1 tablet (0.5 mg total) every morning AND 2 tablets (1 mg total) at bedtime.    Continuous Glucose Sensor (FREESTYLE LIBRE 3 SENSOR) MISC Change sensor every 14 days    Continuous Glucose Sensor (FREESTYLE LIBRE 3 SENSOR) MISC Change sensor every 14 days    Continuous Glucose Sensor (FREESTYLE LIBRE 3 SENSOR) MISC Apply new sensor every 14 days to monitor blood glucose. Remove old  sensor before applying new one.    cycloSPORINE (RESTASIS) 0.05 % ophthalmic emulsion Apply to eye.    cycloSPORINE (RESTASIS) 0.05 % ophthalmic emulsion Place 1 drop into both eyes 2 (two) times daily.    estradiol (ESTRACE) 0.1 MG/GM vaginal cream Place vaginally.    FLUoxetine (PROZAC) 40 MG capsule Take 1 capsule (40 mg total) by mouth daily.    fluticasone (FLONASE) 50 MCG/ACT nasal spray Place 1 spray into both nostrils daily.    Galcanezumab-gnlm (EMGALITY) 120 MG/ML SOSY Inject 1 mL into the skin every 30 (thirty) days.    Glucos-Chond-Hyal Ac-Ca Fructo (MOVE FREE JOINT HEALTH ADVANCE PO) Take by mouth.    Immune Globulin, Human,-klhw (XEMBIFY) 10 GM/50ML SOLN Inject 10 g into the skin every 7 (seven) days.    insulin glargine (LANTUS SOLOSTAR) 100 UNIT/ML Solostar Pen Inject 50 Units into the skin daily.    insulin glargine (LANTUS) 100 UNIT/ML Solostar Pen Inject 50 Units into the skin at bedtime.    insulin glargine (LANTUS) 100 UNIT/ML Solostar Pen Inject 50 Units into the skin at bedtime.    ipratropium-albuterol (DUONEB) 0.5-2.5 (3) MG/3ML SOLN Take 3 mLs by nebulization 2 (two) times daily as needed.    lamoTRIgine (LAMICTAL) 25 MG tablet Take 1 tablet (25 mg total) by mouth 2 (two) times daily.  Lancets (ONETOUCH ULTRASOFT) lancets 1 each by Other route as needed.  04/25/2014: Received from: External Pharmacy   Latanoprostene Bunod (VYZULTA) 0.024 % SOLN Place 1 drop into both eyes at bedtime as directed    lidocaine (LIDODERM) 5 % lidocaine 5 % topical patch  APPLY 1 PATCH BY TOPICAL ROUTE ONCE DAILY (MAY WEAR UP TO 12HOURS.)    lidocaine (LIDODERM) 5 % Place 1 patch onto the skin daily, may wear up to 12 hours    lubiprostone (AMITIZA) 24 MCG capsule Take 24 mcg by mouth 2 (two) times daily.    lubiprostone (AMITIZA) 24 MCG capsule Take 1 capsule (24 mcg total) by mouth 2 (two) times daily.    Melatonin 10 MG TABS Take 1 tablet every day by oral route at bedtime.    Multiple  Vitamin (MULTIVITAMIN) tablet Take 1 tablet by mouth daily.    omeprazole (PRILOSEC) 20 MG capsule Take 20 mg by mouth daily.    omeprazole (PRILOSEC) 20 MG capsule Take 1 capsule (20 mg total) by mouth 2 (two) times daily.    omeprazole (PRILOSEC) 20 MG capsule Take 1 capsule (20 mg total) by mouth 2 (two) times daily.    ondansetron (ZOFRAN-ODT) 8 MG disintegrating tablet Dissolve 1 tablet (8 mg total) on tongue every 8 (eight) hours as needed for nausea or vomiting.    ONETOUCH VERIO test strip 4 (four) times daily.    pregabalin (LYRICA) 200 MG capsule Take 1 capsule (200 mg total) by mouth 2 (two) times daily.    pregabalin (LYRICA) 200 MG capsule Take 1 capsule (200 mg total) by mouth 2 (two) times daily.    rosuvastatin (CRESTOR) 40 MG tablet Take 1 tablet (40 mg total) by mouth at bedtime.    rosuvastatin (CRESTOR) 40 MG tablet Take 1 tablet (40 mg total) by mouth at bedtime.    sennosides-docusate sodium (SENOKOT-S) 8.6-50 MG tablet Take 1 tablet by mouth daily.    tiZANidine (ZANAFLEX) 4 MG tablet Take 4 mg by mouth daily. 4mg  at bedtime and 2 mg during the day 07/05/2023: Patient reports that she only takes 4 mg at night.   tiZANidine (ZANAFLEX) 4 MG tablet Take 1 tablet (4 mg total) by mouth every 12 (twelve) hours as needed.    tiZANidine (ZANAFLEX) 4 MG tablet Take 1 tablet (4 mg total) by mouth at bedtime.    traZODone (DESYREL) 50 MG tablet Take 1 tablet (50 mg total) by mouth at bedtime.    zinc gluconate 50 MG tablet Take 50 mg by mouth daily.    [DISCONTINUED] rOPINIRole (REQUIP) 0.25 MG tablet Take 1 tablet (0.25 mg total) by mouth 2 (two) times daily.    [DISCONTINUED] SUMAtriptan (IMITREX) 100 MG tablet Take 1 tablet by mouth as needed for migraine. If migraine continues, may repeat dose after 2 hours.    No facility-administered encounter medications on file as of 10/02/2023.    ALLERGIES:  Allergies  Allergen Reactions   Molds & Smuts Shortness Of Breath    wheezing    Other Anaphylaxis, Shortness Of Breath and Other (See Comments)     : Angioedema wheezing Wheezing wheezing Other reaction(s): Cough Wheezing Itching and rash Wheezing wheezing Wheezing wheezing Other reaction(s): Other (See Comments)  : Angioedema wheezing Wheezing wheezing Other reaction(s): Cough Wheezing Itching and rash Wheezing wheezing Other reaction(s): Other (See Comments) Wheezing wheezing  : Angioedema wheezing Wheezing wheezing Other reaction(s): Cough Wheezing Itching and rash Wheezing wheezing Wheezing wheezing   Sulfa Antibiotics Anaphylaxis     :  Angioedema    Sulfamethoxazole-Trimethoprim Anaphylaxis   Sulfonamide Derivatives Anaphylaxis     : Angioedema   Trichophyton Shortness Of Breath    wheezing wheezing wheezing wheezing wheezing wheezing wheezing wheezing wheezing   Carbamazepine Rash   Dust Mite Extract Cough and Other (See Comments)    Wheezing Other reaction(s): Wheezing Wheezing Wheezing Other reaction(s): Wheezing Wheezing Wheezing Other reaction(s): Wheezing Wheezing   Gluten Meal Itching   Quetiapine Fumarate Other (See Comments)    LABORATORY DATA:  I have reviewed the labs as listed.  CBC    Component Value Date/Time   WBC 7.6 10/02/2023 0948   RBC 4.77 10/02/2023 0948   HGB 14.4 10/02/2023 0948   HCT 42.6 10/02/2023 0948   PLT 253 10/02/2023 0948   MCV 89.3 10/02/2023 0948   MCH 30.2 10/02/2023 0948   MCHC 33.8 10/02/2023 0948   RDW 14.9 10/02/2023 0948   LYMPHSABS 2.3 10/02/2023 0948   MONOABS 0.6 10/02/2023 0948   EOSABS 0.2 10/02/2023 0948   BASOSABS 0.1 10/02/2023 0948      Latest Ref Rng & Units 03/31/2022   12:00 AM 12/23/2021   12:00 AM 08/06/2021   12:00 AM  CMP  BUN 4 - 21 16     20     16       Creatinine 0.5 - 1.1 1.3     1.2     1.4      Sodium 137 - 147  139     143      Potassium 3.4 - 5.3  5.0     5.0      Chloride 99 - 108  100     102      CO2 13 - 22  21     26        Calcium 8.7 - 10.7 10.4     10.1     10.8      Alkaline Phos 25 - 125  83     78      AST 13 - 35 39     43     28      ALT 7 - 35 U/L 39     39     28         This result is from an external source.    DIAGNOSTIC IMAGING:  I have independently reviewed the relevant imaging and discussed with the patient.   WRAP UP:  All questions were answered. The patient knows to call the clinic with any problems, questions or concerns.  Medical decision making: Moderate  Time spent on visit: I spent 20 minutes counseling the patient face to face. The total time spent in the appointment was 30 minutes and more than 50% was on counseling.  Carnella Guadalajara, PA-C  10/02/23 10:26 AM

## 2023-10-02 ENCOUNTER — Inpatient Hospital Stay: Payer: PPO

## 2023-10-02 ENCOUNTER — Other Ambulatory Visit (HOSPITAL_COMMUNITY): Payer: Self-pay

## 2023-10-02 ENCOUNTER — Telehealth: Payer: Self-pay | Admitting: Nurse Practitioner

## 2023-10-02 ENCOUNTER — Other Ambulatory Visit: Payer: Self-pay

## 2023-10-02 ENCOUNTER — Inpatient Hospital Stay: Payer: PPO | Attending: Physician Assistant | Admitting: Physician Assistant

## 2023-10-02 VITALS — BP 119/65 | HR 64 | Temp 98.4°F | Resp 18 | Ht 71.0 in | Wt 240.4 lb

## 2023-10-02 DIAGNOSIS — R768 Other specified abnormal immunological findings in serum: Secondary | ICD-10-CM

## 2023-10-02 DIAGNOSIS — D839 Common variable immunodeficiency, unspecified: Secondary | ICD-10-CM

## 2023-10-02 DIAGNOSIS — R5382 Chronic fatigue, unspecified: Secondary | ICD-10-CM

## 2023-10-02 DIAGNOSIS — E1165 Type 2 diabetes mellitus with hyperglycemia: Secondary | ICD-10-CM

## 2023-10-02 DIAGNOSIS — D801 Nonfamilial hypogammaglobulinemia: Secondary | ICD-10-CM | POA: Insufficient documentation

## 2023-10-02 LAB — CBC WITH DIFFERENTIAL/PLATELET
Abs Immature Granulocytes: 0.03 10*3/uL (ref 0.00–0.07)
Basophils Absolute: 0.1 10*3/uL (ref 0.0–0.1)
Basophils Relative: 1 %
Eosinophils Absolute: 0.2 10*3/uL (ref 0.0–0.5)
Eosinophils Relative: 3 %
HCT: 42.6 % (ref 36.0–46.0)
Hemoglobin: 14.4 g/dL (ref 12.0–15.0)
Immature Granulocytes: 0 %
Lymphocytes Relative: 30 %
Lymphs Abs: 2.3 10*3/uL (ref 0.7–4.0)
MCH: 30.2 pg (ref 26.0–34.0)
MCHC: 33.8 g/dL (ref 30.0–36.0)
MCV: 89.3 fL (ref 80.0–100.0)
Monocytes Absolute: 0.6 10*3/uL (ref 0.1–1.0)
Monocytes Relative: 8 %
Neutro Abs: 4.5 10*3/uL (ref 1.7–7.7)
Neutrophils Relative %: 58 %
Platelets: 253 10*3/uL (ref 150–400)
RBC: 4.77 MIL/uL (ref 3.87–5.11)
RDW: 14.9 % (ref 11.5–15.5)
WBC: 7.6 10*3/uL (ref 4.0–10.5)
nRBC: 0 % (ref 0.0–0.2)

## 2023-10-02 LAB — COMPREHENSIVE METABOLIC PANEL
ALT: 42 U/L (ref 0–44)
AST: 31 U/L (ref 15–41)
Albumin: 4.1 g/dL (ref 3.5–5.0)
Alkaline Phosphatase: 66 U/L (ref 38–126)
Anion gap: 11 (ref 5–15)
BUN: 16 mg/dL (ref 6–20)
CO2: 26 mmol/L (ref 22–32)
Calcium: 9.9 mg/dL (ref 8.9–10.3)
Chloride: 102 mmol/L (ref 98–111)
Creatinine, Ser: 1.11 mg/dL — ABNORMAL HIGH (ref 0.44–1.00)
GFR, Estimated: 59 mL/min — ABNORMAL LOW (ref >60)
Glucose, Bld: 108 mg/dL — ABNORMAL HIGH (ref 70–99)
Potassium: 4.2 mmol/L (ref 3.5–5.1)
Sodium: 139 mmol/L (ref 135–145)
Total Bilirubin: 0.5 mg/dL (ref 0.3–1.2)
Total Protein: 7.2 g/dL (ref 6.5–8.1)

## 2023-10-02 NOTE — Patient Instructions (Signed)
[facility] at [facility] **VISIT SUMMARY & IMPORTANT INSTRUCTIONS **   You were seen today by Rojelio Brenner PA-C for your hypogammaglobulinemia (immunoglobulin deficiency).    SUBCUTANEOUS IgG We will continue your subcutaneous IgG (Xembify) at the current dose. Overall, you seem to be tolerating your Xembify better than the IVIG infusions. Common side effects of Xembify include headache, fatigue, nausea, diarrhea, abdominal pain, injection site reactions, and fever. Risks associated with Xembify include risk of kidney injury and renal failure, as well as the risk of blood clots in your legs or your lungs ("DVT or PE"). Make sure that you drink plenty of water to maintain excellent hydration while you are taking Xembify!  LABS: You have been given lab orders for [facility].  Please check these labs the day before your Xembify infusion, the week before your next office visit.  FOLLOW-UP APPOINTMENT: Office visit in 3 months  ** Thank you for trusting me with your healthcare!  I strive to provide all of my patients with quality care at each visit.  If you receive a survey for this visit, I would be so grateful to you for taking the time to provide feedback.  Thank you in advance!  ~ Kyrese Gartman                   Dr. Doreatha Massed   &   Rojelio Brenner, PA-C   - - - - - - - - - - - - - - - - - -    Thank you for choosing [facility] at [facility] to provide your oncology and hematology care.  To afford each patient quality time with our provider, please arrive at least 15 minutes before your scheduled appointment time.   If you have a lab appointment with the Cancer Center please come in thru the Main Entrance and check in at the main information desk.  You need to re-schedule your appointment should you arrive 10 or more minutes late.  We strive to give you quality time with our providers, and arriving late affects you and other patients  whose appointments are after yours.  Also, if you no show three or more times for appointments you may be dismissed from the clinic at the providers discretion.     Again, thank you for choosing [facility].  Our hope is that these requests will decrease the amount of time that you wait before being seen by our physicians.       _____________________________________________________________  Should you have questions after your visit to [facility], please contact our office at [phone] and follow the prompts.  Our office hours are 8:00 a.m. and 4:30 p.m. Monday - Friday.  Please note that voicemails left after 4:00 p.m. may not be returned until the following business day.  We are closed weekends and major holidays.  You do have access to a nurse 24-7, just call the main number to the clinic [phone] and do not press any options, hold on the line and a nurse will answer the phone.    For prescription refill requests, have your pharmacy contact our office and allow 72 hours.

## 2023-10-02 NOTE — Telephone Encounter (Signed)
Pt said she is having Fatigue, hair loss and body aches. She wants to see you sooner than her Dec appt. Please Advise

## 2023-10-02 NOTE — Telephone Encounter (Signed)
She can go ahead and repeat some labs now (I entered them already) and then I can reach out with the results.  Schedule is tight right now.

## 2023-10-03 DIAGNOSIS — D801 Nonfamilial hypogammaglobulinemia: Secondary | ICD-10-CM | POA: Diagnosis not present

## 2023-10-04 ENCOUNTER — Other Ambulatory Visit: Payer: Self-pay

## 2023-10-04 ENCOUNTER — Other Ambulatory Visit (HOSPITAL_COMMUNITY): Payer: Self-pay

## 2023-10-04 NOTE — Progress Notes (Signed)
10/04/2023  Patient ID: Michelle Clements, female   DOB: 07-Jan-1966, 57 y.o.   MRN: [id number]    2025 Medication Assistance Renewal Application Summary:  Patient was outreached regarding medication assistance renewal for 2025. Verified address, anticipated insurance for 2025, and income has not changed. Patient remains interested in PAP for 2025 for Amitiza, please see below for other medications identified for medication assistance.    Medication Review Findings:  Amitiza available as generic and patient assistance is no longer offered.  Emgality is prescribed by [facility] provider and assistance approved for 07/26/2023 through 01/22/2024 (per telephone note on 07/26/23 Per reported income, patient eligible for Lantus and Vyzulta PA  Medication Assistance Findings:  Medication assistance needs identified: Lantus and Vyzulta    Plan: I will route patient assistance letter to pharmacy technician who will coordinate patient assistance program application process for medications listed above.  Pharmacy technician will assist with obtaining all required documents from both patient and provider(s) and submit application(s) once completed.    Thank you for allowing pharmacy to be a part of this patient's care.  Cephus Shelling, PharmD Clinical Pharmacist [facility] Cell: [phone]

## 2023-10-06 ENCOUNTER — Other Ambulatory Visit: Payer: Self-pay

## 2023-10-08 LAB — IMMUNOGLOBULINS A/E/G/M, SERUM
IgA: 55 mg/dL — ABNORMAL LOW (ref 87–352)
IgE (Immunoglobulin E), Serum: 19 [IU]/mL (ref 6–495)
IgG (Immunoglobin G), Serum: 922 mg/dL (ref 586–1602)
IgM (Immunoglobulin M), Srm: 11 mg/dL — ABNORMAL LOW (ref 26–217)

## 2023-10-09 DIAGNOSIS — F319 Bipolar disorder, unspecified: Secondary | ICD-10-CM | POA: Diagnosis not present

## 2023-10-10 DIAGNOSIS — D801 Nonfamilial hypogammaglobulinemia: Secondary | ICD-10-CM | POA: Diagnosis not present

## 2023-10-11 ENCOUNTER — Encounter: Payer: Self-pay | Admitting: Physician Assistant

## 2023-10-12 ENCOUNTER — Other Ambulatory Visit: Payer: Self-pay

## 2023-10-12 ENCOUNTER — Encounter (HOSPITAL_COMMUNITY): Payer: Self-pay | Admitting: Hematology & Oncology

## 2023-10-17 ENCOUNTER — Other Ambulatory Visit: Payer: Self-pay

## 2023-10-17 DIAGNOSIS — D801 Nonfamilial hypogammaglobulinemia: Secondary | ICD-10-CM | POA: Diagnosis not present

## 2023-10-19 ENCOUNTER — Other Ambulatory Visit: Payer: Self-pay | Admitting: Pharmacy Technician

## 2023-10-19 DIAGNOSIS — Z5986 Financial insecurity: Secondary | ICD-10-CM

## 2023-10-19 NOTE — Progress Notes (Addendum)
Pharmacy Medication Assistance Program Note    10/19/2023  Patient ID: Michelle Clements, female   DOB: 24-Mar-1966, 57 y.o.   MRN: [id number]     10/19/2023  Outreach Medication One  Initial Outreach Date (Medication One) 10/18/2023  Manufacturer Medication One Other Manufacturer/Drug  Other Manufacturer/Drug [employer]/Vyzulta eye drop  Dose of Other Drug 0.024%  Type of Assistance Manufacturer Assistance  Date Application Sent to Patient 10/18/2023  Application Items Requested Application;Proof of Income;Other  Date Application Sent to Prescriber 10/20/2023  Name of Prescriber Chalmers Guest          10/19/2023  Outreach Medication Two  Initial Outreach Date (Medication Two) 10/18/2023  Manufacturer Medication Two Sanofi  Sanofi Drugs Lantus  Dose of Lantus 100 units/ml  Type of Assistance Manufacturer Assistance  Date Application Sent to Patient 10/18/2023  Application Items Requested Application;Proof of Income;Other  Date Application Sent to Prescriber 10/20/2023  Name of Prescriber Ronny Bacon     [facility] 01/04/2024 Successful outreach to patient in regard to Vyzulta applicaoitn with [employer] and Lantus applicaiton with Sanofi. HIPAA verified. Inquired if patient had received the applications in the mail. Patient informs she did receive the paperwork but did not pursue the process as the household income was above the limits listed on the applications. Patient appreciated the follow up phone call. Patient was provided our name and number if future assistance is needed. Will close case and send in basket message to [facility] PharmD Cephus Shelling notifying her of case closure.   [name], CPhT [place]  Office: [phone] Fax: [phone] Email: [email or web address]

## 2023-10-23 ENCOUNTER — Telehealth: Payer: Self-pay | Admitting: *Deleted

## 2023-10-23 NOTE — Telephone Encounter (Signed)
Orders called to [facility] to administer Xembify Q 2 weeks rather than weekly per Rojelio Brenner, PAC.

## 2023-10-24 DIAGNOSIS — D801 Nonfamilial hypogammaglobulinemia: Secondary | ICD-10-CM | POA: Diagnosis not present

## 2023-10-25 ENCOUNTER — Other Ambulatory Visit: Payer: Self-pay

## 2023-10-25 ENCOUNTER — Other Ambulatory Visit (HOSPITAL_COMMUNITY): Payer: Self-pay

## 2023-10-25 DIAGNOSIS — G43709 Chronic migraine without aura, not intractable, without status migrainosus: Secondary | ICD-10-CM | POA: Diagnosis not present

## 2023-10-25 DIAGNOSIS — Z79899 Other long term (current) drug therapy: Secondary | ICD-10-CM | POA: Diagnosis not present

## 2023-10-25 DIAGNOSIS — H02402 Unspecified ptosis of left eyelid: Secondary | ICD-10-CM | POA: Diagnosis not present

## 2023-10-25 DIAGNOSIS — G4486 Cervicogenic headache: Secondary | ICD-10-CM | POA: Diagnosis not present

## 2023-10-25 DIAGNOSIS — G43009 Migraine without aura, not intractable, without status migrainosus: Secondary | ICD-10-CM | POA: Diagnosis not present

## 2023-10-25 MED ORDER — NURTEC 75 MG PO TBDP
ORAL_TABLET | ORAL | 11 refills | Status: DC
  Filled 2023-10-25: qty 8, 30d supply, fill #0
  Filled 2023-12-08: qty 8, 30d supply, fill #1
  Filled 2024-01-08: qty 8, 30d supply, fill #2

## 2023-10-26 ENCOUNTER — Other Ambulatory Visit: Payer: Self-pay

## 2023-10-27 ENCOUNTER — Other Ambulatory Visit (HOSPITAL_COMMUNITY): Payer: Self-pay

## 2023-10-27 DIAGNOSIS — Z794 Long term (current) use of insulin: Secondary | ICD-10-CM | POA: Diagnosis not present

## 2023-10-27 DIAGNOSIS — M47816 Spondylosis without myelopathy or radiculopathy, lumbar region: Secondary | ICD-10-CM | POA: Diagnosis not present

## 2023-10-27 DIAGNOSIS — M5412 Radiculopathy, cervical region: Secondary | ICD-10-CM | POA: Diagnosis not present

## 2023-10-27 DIAGNOSIS — M7918 Myalgia, other site: Secondary | ICD-10-CM | POA: Diagnosis not present

## 2023-10-27 DIAGNOSIS — G43009 Migraine without aura, not intractable, without status migrainosus: Secondary | ICD-10-CM | POA: Diagnosis not present

## 2023-10-27 DIAGNOSIS — E1165 Type 2 diabetes mellitus with hyperglycemia: Secondary | ICD-10-CM | POA: Diagnosis not present

## 2023-10-27 DIAGNOSIS — M797 Fibromyalgia: Secondary | ICD-10-CM | POA: Diagnosis not present

## 2023-10-27 DIAGNOSIS — R768 Other specified abnormal immunological findings in serum: Secondary | ICD-10-CM | POA: Diagnosis not present

## 2023-10-27 DIAGNOSIS — G4486 Cervicogenic headache: Secondary | ICD-10-CM | POA: Diagnosis not present

## 2023-10-27 DIAGNOSIS — R5382 Chronic fatigue, unspecified: Secondary | ICD-10-CM | POA: Diagnosis not present

## 2023-10-27 DIAGNOSIS — Z6833 Body mass index (BMI) 33.0-33.9, adult: Secondary | ICD-10-CM | POA: Diagnosis not present

## 2023-10-27 DIAGNOSIS — M546 Pain in thoracic spine: Secondary | ICD-10-CM | POA: Diagnosis not present

## 2023-10-28 ENCOUNTER — Other Ambulatory Visit (HOSPITAL_COMMUNITY): Payer: Self-pay

## 2023-10-28 LAB — COMPREHENSIVE METABOLIC PANEL
ALT: 44 [IU]/L — ABNORMAL HIGH (ref 0–32)
AST: 38 [IU]/L (ref 0–40)
Albumin: 4.7 g/dL (ref 3.8–4.9)
Alkaline Phosphatase: 83 [IU]/L (ref 44–121)
BUN/Creatinine Ratio: 11 (ref 9–23)
BUN: 13 mg/dL (ref 6–24)
Bilirubin Total: 0.3 mg/dL (ref 0.0–1.2)
CO2: 25 mmol/L (ref 20–29)
Calcium: 10.1 mg/dL (ref 8.7–10.2)
Chloride: 102 mmol/L (ref 96–106)
Creatinine, Ser: 1.15 mg/dL — ABNORMAL HIGH (ref 0.57–1.00)
Globulin, Total: 2.3 g/dL (ref 1.5–4.5)
Glucose: 141 mg/dL — ABNORMAL HIGH (ref 70–99)
Potassium: 4.7 mmol/L (ref 3.5–5.2)
Sodium: 141 mmol/L (ref 134–144)
Total Protein: 7 g/dL (ref 6.0–8.5)
eGFR: 56 mL/min/{1.73_m2} — ABNORMAL LOW (ref >59)

## 2023-10-28 LAB — TSH: TSH: 1.03 u[IU]/mL (ref 0.450–4.500)

## 2023-10-28 LAB — T3, FREE: T3, Free: 2.8 pg/mL (ref 2.0–4.4)

## 2023-10-28 LAB — T4, FREE: Free T4: 1.14 ng/dL (ref 0.82–1.77)

## 2023-10-28 LAB — VITAMIN D 25 HYDROXY (VIT D DEFICIENCY, FRACTURES): Vit D, 25-Hydroxy: 73.7 ng/mL (ref 30.0–100.0)

## 2023-10-30 ENCOUNTER — Other Ambulatory Visit: Payer: Self-pay | Admitting: Neurosurgery

## 2023-10-30 DIAGNOSIS — M5412 Radiculopathy, cervical region: Secondary | ICD-10-CM

## 2023-10-30 DIAGNOSIS — M546 Pain in thoracic spine: Secondary | ICD-10-CM

## 2023-10-30 DIAGNOSIS — M47816 Spondylosis without myelopathy or radiculopathy, lumbar region: Secondary | ICD-10-CM

## 2023-10-31 ENCOUNTER — Other Ambulatory Visit: Payer: Self-pay

## 2023-10-31 DIAGNOSIS — D801 Nonfamilial hypogammaglobulinemia: Secondary | ICD-10-CM | POA: Diagnosis not present

## 2023-11-01 ENCOUNTER — Encounter: Payer: Self-pay | Admitting: Neurology

## 2023-11-01 ENCOUNTER — Encounter: Payer: Self-pay | Admitting: Physician Assistant

## 2023-11-03 ENCOUNTER — Other Ambulatory Visit (HOSPITAL_COMMUNITY): Payer: Self-pay | Admitting: Neurology

## 2023-11-03 DIAGNOSIS — G43719 Chronic migraine without aura, intractable, without status migrainosus: Secondary | ICD-10-CM

## 2023-11-03 DIAGNOSIS — G4486 Cervicogenic headache: Secondary | ICD-10-CM

## 2023-11-05 ENCOUNTER — Other Ambulatory Visit (HOSPITAL_COMMUNITY): Payer: Self-pay

## 2023-11-06 ENCOUNTER — Other Ambulatory Visit (HOSPITAL_COMMUNITY): Payer: Self-pay

## 2023-11-06 MED ORDER — LANTUS SOLOSTAR 100 UNIT/ML ~~LOC~~ SOPN
50.0000 [IU] | PEN_INJECTOR | Freq: Every day | SUBCUTANEOUS | 3 refills | Status: DC
  Filled 2023-11-06 – 2023-11-27 (×11): qty 15, 30d supply, fill #0

## 2023-11-07 ENCOUNTER — Encounter: Payer: Self-pay | Admitting: Nurse Practitioner

## 2023-11-07 ENCOUNTER — Other Ambulatory Visit (HOSPITAL_COMMUNITY): Payer: Self-pay

## 2023-11-07 ENCOUNTER — Ambulatory Visit (INDEPENDENT_AMBULATORY_CARE_PROVIDER_SITE_OTHER): Payer: PPO | Admitting: Nurse Practitioner

## 2023-11-07 VITALS — BP 122/79 | HR 73 | Ht 71.0 in | Wt 239.4 lb

## 2023-11-07 DIAGNOSIS — R5382 Chronic fatigue, unspecified: Secondary | ICD-10-CM

## 2023-11-07 DIAGNOSIS — E1165 Type 2 diabetes mellitus with hyperglycemia: Secondary | ICD-10-CM | POA: Diagnosis not present

## 2023-11-07 DIAGNOSIS — Z794 Long term (current) use of insulin: Secondary | ICD-10-CM

## 2023-11-07 DIAGNOSIS — R768 Other specified abnormal immunological findings in serum: Secondary | ICD-10-CM | POA: Diagnosis not present

## 2023-11-07 DIAGNOSIS — D801 Nonfamilial hypogammaglobulinemia: Secondary | ICD-10-CM | POA: Diagnosis not present

## 2023-11-07 LAB — POCT GLYCOSYLATED HEMOGLOBIN (HGB A1C): Hemoglobin A1C: 6.2 % — AB (ref 4.0–5.6)

## 2023-11-07 NOTE — Progress Notes (Signed)
Endocrinology Follow Up Visit       11/07/2023, 10:56 AM   Subjective:    Patient ID: Michelle Clements, female    DOB: May 17, 1966.  Michelle Clements is being seen in follow up after being seen in consultation for management of currently uncontrolled symptomatic diabetes requested by  Benita Stabile, MD.   Past Medical History:  Diagnosis Date   Allergic rhinitis    Anemia    Arthritis    Lower back and hips   Asthma    Bipolar disorder (HCC)    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HCC)    Constipation    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HCC)    Depression    Essential hypertension, benign    Fibromyalgia    GERD (gastroesophageal reflux disease)    H/O sleep apnea    Hip pain, left    History of palpitations    Negative Holter monitor   History of pneumonia 1988   Hyperlipidemia    IBS (irritable bowel syndrome)    Neck pain    Poor short term memory    PTSD (post-traumatic stress disorder)    S/P Botox injection 11/2014    For migraine headaches   Type 2 diabetes mellitus (HCC)    Urgency of urination     Past Surgical History:  Procedure Laterality Date   CARPAL TUNNEL RELEASE Right 06/10/2013   Procedure: CARPAL TUNNEL RELEASE;  Surgeon: Reinaldo Meeker, MD;  Location: [facility];  Service: Neurosurgery;  Laterality: Right;  Right Carpal Tunnel Release    CHOLECYSTECTOMY     DENTAL SURGERY  12/2014   mole exc     x 3   MUSCLE BIOPSY  2008   Right leg   TUBAL LIGATION      Social History   Socioeconomic History   Marital status: Married    Spouse name: Not on file   Number of children: 2   Years of education: College   Highest education level: Associate degree: academic program  Occupational History   Occupation: Chiropractor: UNEMPLOYED    Comment: Now disabled due to bipolar and fibromyalgia  Tobacco Use   Smoking status: Former    Current packs/day: 0.00    Types:  Cigarettes    Quit date: 11/04/2010    Years since quitting: 13.0   Smokeless tobacco: Never  Vaping Use   Vaping status: Never Used  Substance and Sexual Activity   Alcohol use: Yes    Comment: one mixed drink per month   Drug use: No   Sexual activity: Yes    Birth control/protection: Surgical  Other Topics Concern   Not on file  Social History Narrative   Married   Lives with spouse and daughter   Right handed.   Caffeine use: 2 cups coffee in morning and 2 cups tea during the day    Social Determinants of Health   Financial Resource Strain: Low Risk  (07/27/2021)   Overall Financial Resource Strain (CARDIA)    Difficulty of Paying Living Expenses: Not very hard  Food Insecurity: No Food Insecurity (07/27/2021)   Hunger Vital Sign    Worried About  Running Out of Food in the Last Year: Never true    Ran Out of Food in the Last Year: Never true  Transportation Needs: No Transportation Needs (07/27/2021)   PRAPARE - Administrator, Civil Service (Medical): No    Lack of Transportation (Non-Medical): No  Physical Activity: Inactive (07/27/2021)   Exercise Vital Sign    Days of Exercise per Week: 0 days    Minutes of Exercise per Session: 0 min  Stress: No Stress Concern Present (07/27/2021)   [employer] of Occupational Health - Occupational Stress Questionnaire    Feeling of Stress : Not at all  Social Connections: Unknown (04/18/2022)   Received from [employer], [facility]   Social Network    Social Network: Not on file    Family History  Problem Relation Age of Onset   Fibromyalgia Mother    Diabetes type II Mother    Depression Mother    Alzheimer's disease Mother    GER disease Mother    Heart disease Mother    Paranoid behavior Mother    Dementia Mother    Heart attack Father 89       MI x5   Stroke Father        CVA x7   Asthma Father    Brain cancer Father    Seizures Father    Anxiety disorder Sister    OCD Sister    Sexual  abuse Sister    Physical abuse Sister    Anxiety disorder Sister    ADD / ADHD Daughter    ADD / ADHD Daughter    Alcohol abuse Neg Hx    Drug abuse Neg Hx    Schizophrenia Neg Hx     Outpatient Encounter Medications as of 11/07/2023  Medication Sig   albuterol (VENTOLIN HFA) 108 (90 Base) MCG/ACT inhaler albuterol sulfate HFA 90 mcg/actuation aerosol inhaler  INHALE TWO PUFFS BY MOUTH INTO LUNGS EVERY 6 HOURS AS NEEDED   albuterol (VENTOLIN HFA) 108 (90 Base) MCG/ACT inhaler Inhale 2 puffs into the lungs every 6 (six) hours as needed FOR SHORTNESS OF BREATH AND WHEEZING   albuterol (VENTOLIN HFA) 108 (90 Base) MCG/ACT inhaler Inhale 2 puffs into the lungs every 6 (six) hours as needed for wheezing and/or shortness of breath.   aspirin EC 81 MG tablet Take 1 tablet every day by oral route.   azelastine (ASTELIN) 0.1 % nasal spray Place 1 spray into both nostrils 2 (two) times daily.   Blood Glucose Monitoring Suppl (ONETOUCH VERIO FLEX SYSTEM) w/Device KIT Use as directed to check blood glucose twice daily, before breakfast and before bed.   buPROPion (WELLBUTRIN XL) 150 MG 24 hr tablet Take 1 tablet (150 mg total) by mouth every morning.   cetirizine (ZYRTEC) 10 MG tablet Take 10 mg by mouth at bedtime.   Cholecalciferol (VITAMIN D) 50 MCG (2000 UT) tablet Take 2,000 Units by mouth daily.   clonazePAM (KLONOPIN) 0.5 MG tablet Take 1 tablet (0.5 mg total) every morning AND 2 tablets (1 mg total) at bedtime.   Continuous Glucose Sensor (FREESTYLE LIBRE 3 SENSOR) MISC Change sensor every 14 days   Continuous Glucose Sensor (FREESTYLE LIBRE 3 SENSOR) MISC Apply new sensor every 14 days to monitor blood glucose. Remove old sensor before applying new one.   cycloSPORINE (RESTASIS) 0.05 % ophthalmic emulsion Apply to eye.   cycloSPORINE (RESTASIS) 0.05 % ophthalmic emulsion Place 1 drop into both eyes 2 (two) times daily.  estradiol (ESTRACE) 0.1 MG/GM vaginal cream Place vaginally.    FLUoxetine (PROZAC) 40 MG capsule Take 1 capsule (40 mg total) by mouth daily.   fluticasone (FLONASE) 50 MCG/ACT nasal spray Place 1 spray into both nostrils daily.   Galcanezumab-gnlm (EMGALITY) 120 MG/ML SOSY Inject 1 mL into the skin every 30 (thirty) days.   Glucos-Chond-Hyal Ac-Ca Fructo (MOVE FREE JOINT HEALTH ADVANCE PO) Take by mouth.   Immune Globulin, Human,-klhw (XEMBIFY) 10 GM/50ML SOLN Inject 10 g into the skin every 7 (seven) days.   insulin glargine (LANTUS SOLOSTAR) 100 UNIT/ML Solostar Pen Inject 50 Units into the skin daily.   insulin glargine (LANTUS) 100 UNIT/ML Solostar Pen Inject 50 Units into the skin at bedtime.   insulin glargine (LANTUS) 100 UNIT/ML Solostar Pen Inject 50 Units into the skin at bedtime.   ipratropium-albuterol (DUONEB) 0.5-2.5 (3) MG/3ML SOLN Take 3 mLs by nebulization 2 (two) times daily as needed.   lamoTRIgine (LAMICTAL) 25 MG tablet Take 1 tablet (25 mg total) by mouth 2 (two) times daily.   Lancets (ONETOUCH ULTRASOFT) lancets 1 each by Other route as needed.    Latanoprostene Bunod (VYZULTA) 0.024 % SOLN Place 1 drop into both eyes at bedtime as directed   lidocaine (LIDODERM) 5 % lidocaine 5 % topical patch  APPLY 1 PATCH BY TOPICAL ROUTE ONCE DAILY (MAY WEAR UP TO 12HOURS.)   lidocaine (LIDODERM) 5 % Place 1 patch onto the skin daily, may wear up to 12 hours   lubiprostone (AMITIZA) 24 MCG capsule Take 24 mcg by mouth 2 (two) times daily.   lubiprostone (AMITIZA) 24 MCG capsule Take 1 capsule (24 mcg total) by mouth 2 (two) times daily.   Melatonin 10 MG TABS Take 1 tablet every day by oral route at bedtime.   Multiple Vitamin (MULTIVITAMIN) tablet Take 1 tablet by mouth daily.   omeprazole (PRILOSEC) 20 MG capsule Take 20 mg by mouth daily.   omeprazole (PRILOSEC) 20 MG capsule Take 1 capsule (20 mg total) by mouth 2 (two) times daily.   omeprazole (PRILOSEC) 20 MG capsule Take 1 capsule (20 mg total) by mouth 2 (two) times daily.    ondansetron (ZOFRAN-ODT) 8 MG disintegrating tablet Dissolve 1 tablet (8 mg total) on tongue every 8 (eight) hours as needed for nausea or vomiting.   ONETOUCH VERIO test strip 4 (four) times daily.   pregabalin (LYRICA) 200 MG capsule Take 1 capsule (200 mg total) by mouth 2 (two) times daily.   pregabalin (LYRICA) 200 MG capsule Take 1 capsule (200 mg total) by mouth 2 (two) times daily.   Rimegepant Sulfate (NURTEC) 75 MG TBDP Take 75mg  once daily as needed for headache   rosuvastatin (CRESTOR) 40 MG tablet Take 1 tablet (40 mg total) by mouth at bedtime.   rosuvastatin (CRESTOR) 40 MG tablet Take 1 tablet (40 mg total) by mouth at bedtime.   sennosides-docusate sodium (SENOKOT-S) 8.6-50 MG tablet Take 1 tablet by mouth daily.   tiZANidine (ZANAFLEX) 4 MG tablet Take 4 mg by mouth daily. 4mg  at bedtime and 2 mg during the day   tiZANidine (ZANAFLEX) 4 MG tablet Take 1 tablet (4 mg total) by mouth every 12 (twelve) hours as needed.   tiZANidine (ZANAFLEX) 4 MG tablet Take 1 tablet (4 mg total) by mouth at bedtime.   traZODone (DESYREL) 50 MG tablet Take 1 tablet (50 mg total) by mouth at bedtime.   zinc gluconate 50 MG tablet Take 50 mg by mouth daily.  Continuous Glucose Sensor (FREESTYLE LIBRE 3 SENSOR) MISC Change sensor every 14 days (Patient not taking: Reported on 11/07/2023)   [DISCONTINUED] insulin glargine (LANTUS SOLOSTAR) 100 UNIT/ML Solostar Pen Inject 50 Units into the skin daily.   [DISCONTINUED] rOPINIRole (REQUIP) 0.25 MG tablet Take 1 tablet (0.25 mg total) by mouth 2 (two) times daily.   [DISCONTINUED] SUMAtriptan (IMITREX) 100 MG tablet Take 1 tablet by mouth as needed for migraine. If migraine continues, may repeat dose after 2 hours.   No facility-administered encounter medications on file as of 11/07/2023.    ALLERGIES: Allergies  Allergen Reactions   Molds & Smuts Shortness Of Breath    wheezing   Other Anaphylaxis, Shortness Of Breath and Other (See Comments)     :  Angioedema wheezing Wheezing wheezing Other reaction(s): Cough Wheezing Itching and rash Wheezing wheezing Wheezing wheezing Other reaction(s): Other (See Comments)  : Angioedema wheezing Wheezing wheezing Other reaction(s): Cough Wheezing Itching and rash Wheezing wheezing Other reaction(s): Other (See Comments) Wheezing wheezing  : Angioedema wheezing Wheezing wheezing Other reaction(s): Cough Wheezing Itching and rash Wheezing wheezing Wheezing wheezing   Sulfa Antibiotics Anaphylaxis     : Angioedema    Sulfamethoxazole-Trimethoprim Anaphylaxis   Sulfonamide Derivatives Anaphylaxis     : Angioedema   Trichophyton Shortness Of Breath    wheezing wheezing wheezing wheezing wheezing wheezing wheezing wheezing wheezing   Carbamazepine Rash   Dust Mite Extract Cough and Other (See Comments)    Wheezing Other reaction(s): Wheezing Wheezing Wheezing Other reaction(s): Wheezing Wheezing Wheezing Other reaction(s): Wheezing Wheezing   Gluten Meal Itching   Quetiapine Fumarate Other (See Comments)    VACCINATION STATUS: Immunization History  Administered Date(s) Administered   Influenza Whole 10/03/2007   Influenza-Unspecified 09/18/2014, 09/23/2015   Pneumococcal Polysaccharide-23 10/14/2012    Diabetes She presents for her follow-up diabetic visit. She has type 2 diabetes mellitus. Onset time: She was diagnosed at approximate age of 70. Her disease course has been improving. There are no hypoglycemic associated symptoms. Pertinent negatives for diabetes include no blurred vision, no fatigue, no polydipsia, no polyphagia and no polyuria. There are no hypoglycemic complications. Symptoms are stable. Diabetic complications include nephropathy and retinopathy. Risk factors for coronary artery disease include diabetes mellitus, dyslipidemia, obesity and sedentary lifestyle. Current diabetic treatment includes insulin injections. She is compliant with  treatment all of the time. Her weight is fluctuating minimally. She is following a generally healthy diet. Meal planning includes avoidance of concentrated sweets. She has not had a previous visit with a dietitian. She participates in exercise intermittently. Her home blood glucose trend is decreasing steadily. Her overall blood glucose range is 130-140 mg/dl. (She presents today with her CGM and logs showing at goal glycemic profile.  Her POCT A1c today is 6.2%, improving from last visit of 6.5%.  Analysis of her CGM shows TIR 92%, TAR 8%, TBR 0%.  She did have one episode of hypoglycemia where glucose dropped to 62 overnight (thinks related to lighter supper).) An ACE inhibitor/angiotensin II receptor blocker is not being taken. She does not see a podiatrist.Eye exam is current.  Hyperlipidemia This is a chronic problem. The current episode started more than 1 year ago. The problem is uncontrolled. Recent lipid tests were reviewed and are variable. Exacerbating diseases include chronic renal disease, diabetes and obesity. There are no known factors aggravating her hyperlipidemia. Current antihyperlipidemic treatment includes statins. The current treatment provides moderate improvement of lipids. There are no compliance problems.  Risk factors for coronary artery  disease include diabetes mellitus, dyslipidemia, obesity and a sedentary lifestyle.    Review of systems  Constitutional: + steadily decreasing body weight-unintentional,  current Body mass index is 33.39 kg/m. , no fatigue, no subjective hyperthermia, no subjective hypothermia Eyes: no blurry vision, no xerophthalmia ENT: no sore throat, no nodules palpated in throat, no dysphagia/odynophagia, no hoarseness Cardiovascular: no chest pain, no shortness of breath, no palpitations, no leg swelling Respiratory: no cough, no shortness of breath Gastrointestinal: no nausea/vomiting/diarrhea Musculoskeletal: no muscle/joint aches Skin: no rashes,  no hyperemia Neurological: no tremors, no numbness, no tingling, no dizziness Psychiatric: no depression, no anxiety, hx bipolar d/o- controlled on meds   Objective:    BP 122/79 (BP Location: Right Arm, Patient Position: Sitting, Cuff Size: Large)   Pulse 73   Ht 5\' 11"  (1.803 m)   Wt 239 lb 6.4 oz (108.6 kg)   LMP 01/04/2018   BMI 33.39 kg/m   Wt Readings from Last 3 Encounters:  11/07/23 239 lb 6.4 oz (108.6 kg)  10/02/23 240 lb 6.4 oz (109 kg)  07/05/23 250 lb 3.2 oz (113.5 kg)    BP Readings from Last 3 Encounters:  11/07/23 122/79  10/02/23 119/65  07/05/23 117/73    Physical Exam- Limited  Constitutional:  Body mass index is 33.39 kg/m. , not in acute distress, normal state of mind Eyes:  EOMI, no exophthalmos Musculoskeletal: no gross deformities, strength intact in all four extremities, no gross restriction of joint movements Skin:  no rashes, no hyperemia Neurological: no tremor with outstretched hands   Diabetic Foot Exam - Simple   No data filed    CMP ( most recent) CMP     Component Value Date/Time   NA 141 10/27/2023 1014   K 4.7 10/27/2023 1014   CL 102 10/27/2023 1014   CO2 25 10/27/2023 1014   GLUCOSE 141 (H) 10/27/2023 1014   GLUCOSE 108 (H) 10/02/2023 0948   BUN 13 10/27/2023 1014   CREATININE 1.15 (H) 10/27/2023 1014   CREATININE 0.88 02/21/2012 1153   CALCIUM 10.1 10/27/2023 1014   PROT 7.0 10/27/2023 1014   ALBUMIN 4.7 10/27/2023 1014   AST 38 10/27/2023 1014   ALT 44 (H) 10/27/2023 1014   ALKPHOS 83 10/27/2023 1014   BILITOT 0.3 10/27/2023 1014   GFRNONAA 59 (L) 10/02/2023 0948   GFRAA 50 12/29/2020 0000     Diabetic Labs (most recent): Lab Results  Component Value Date   HGBA1C 6.2 (A) 11/07/2023   HGBA1C 6.5 (A) 07/05/2023   HGBA1C 5.7 (A) 08/11/2022   MICROALBUR 26 03/31/2022   MICROALBUR 150 10/22/2020     Lipid Panel ( most recent) Lipid Panel     Component Value Date/Time   CHOL 196 08/06/2021 0000   TRIG 199  (A) 03/31/2022 0000   HDL 54 08/06/2021 0000   CHOLHDL 4.2 03/18/2015 0915   VLDL 44 (H) 03/18/2015 0915   LDLCALC 126 03/31/2022 0000      Lab Results  Component Value Date   TSH 1.030 10/27/2023   TSH 1.920 02/03/2023   TSH 2.69 12/29/2020   TSH 1.66 09/14/2020   TSH 2.770 08/29/2019   TSH 3.261 10/14/2012   TSH 2.651 02/21/2012   TSH 1.981 03/06/2009   TSH 3.250 01/23/2008   FREET4 1.14 10/27/2023   FREET4 1.05 02/03/2023           Assessment & Plan:   1) Type 2 diabetes mellitus with hyperglycemia, with long-term current use of insulin (HCC)  -  Michelle Clements has currently uncontrolled symptomatic type 2 DM since 57 years of age.  She presents today with her CGM and logs showing at goal glycemic profile.  Her POCT A1c today is 6.2%, improving from last visit of 6.5%.  Analysis of her CGM shows TIR 92%, TAR 8%, TBR 0%.  She did have one episode of hypoglycemia where glucose dropped to 62 overnight (thinks related to lighter supper).   Recent labs reviewed, showing stable CKD stage 3.  - I had a long discussion with her about the progressive nature of diabetes and the pathology behind its complications. -her diabetes is complicated by sleep apnea, HLD, retinopathy and she remains at a high risk for more acute and chronic complications which include CAD, CVA, CKD, retinopathy, and neuropathy. These are all discussed in detail with her.  - Nutritional counseling repeated at each appointment due to patients tendency to fall back in to old habits.  - The patient admits there is a room for improvement in their diet and drink choices. -  Suggestion is made for the patient to avoid simple carbohydrates from their diet including Cakes, Sweet Desserts / Pastries, Ice Cream, Soda (diet and regular), Sweet Tea, Candies, Chips, Cookies, Sweet Pastries, Store Bought Juices, Alcohol in Excess of 1-2 drinks a day, Artificial Sweeteners, Coffee Creamer, and "Sugar-free" Products. This will  help patient to have stable blood glucose profile and potentially avoid unintended weight gain.   - I encouraged the patient to switch to unprocessed or minimally processed complex starch and increased protein intake (animal or plant source), fruits, and vegetables.   - Patient is advised to stick to a routine mealtimes to eat 3 meals a day and avoid unnecessary snacks (to snack only to correct hypoglycemia).  - she is following with Norm Salt, RDE for diabetes education.  - I have approached her with the following individualized plan to manage  her diabetes and patient agrees:   -Based on her improved, at target glycemic profile and lack of hypoglycemia, no changes will be made to her medication regimen today.  She is advised to continue her Lantus 50 units SQ nightly.   -She is encouraged to continue monitoring blood glucose twice daily, before breakfast and before bed, and to call the clinic if she has readings less than 70 or greater than 200 for 3 tests in a row.  - Specific targets for  A1c;  LDL, HDL,  and Triglycerides were discussed with the patient.  2) Blood Pressure /Hypertension:   her blood pressure is controlled to target without the use of antihypertensive medications.  She is advised to continue Lisinopril 5 mg po daily with breakfast.   3) Lipids/Hyperlipidemia:    Her most recent lipid panel from 07/25/23 shows controlled LDL of 87.  She is advised to continue her Crestor 20 mg po daily at bedtime.  Side effects and precautions discussed with her.  4) Weight/Diet:  Her Body mass index is 33.39 kg/m.  -  clearly complicating her diabetes care.  She has lost approx 11 lbs and is advised to keep it up!  she is a candidate for weight loss. I discussed with her the fact that loss of 5 - 10% of her  current body weight will have the most impact on her diabetes management.  Exercise, and detailed carbohydrates information provided  -  detailed on discharge instructions.  5)  Vitamin D deficiency Her most recent vitamin D level was 73.7 on 10/27/23.  She  is currently on OTC vitamin D3 2000 units daily.  She is advised to continue supplementation at this time, especially during the winter months.  6) Chronic Care/Health Maintenance: -she is on Statin medications and is encouraged to initiate and continue to follow up with Ophthalmology, Dentist,  Podiatrist at least yearly or according to recommendations, and advised to stay away from smoking. I have recommended yearly flu vaccine and pneumonia vaccine at least every 5 years; moderate intensity exercise for up to 150 minutes weekly; and  sleep for at least 7 hours a day.  7) Positive thyroid antibodies Thyroglobulin antibodies were high at 10.1 on 02/03/23 indicating genetically predisposed to developing a thyroid issue in the future.  And her thyroid is consistent with euthyroid presentation at this time.  She does not need antithyroid treatment or thyroid hormone replacement at this time.    Her repeat labs still show euthyroid presentation.  Will recheck prior to next visit.    - she is advised to maintain close follow up with Benita Stabile, MD for primary care needs, as well as her other providers for optimal and coordinated care.     I spent  30  minutes in the care of the patient today including review of labs from CMP, Lipids, Thyroid Function, Hematology (current and previous including abstractions from other facilities); face-to-face time discussing  her blood glucose readings/logs, discussing hypoglycemia and hyperglycemia episodes and symptoms, medications doses, her options of short and long term treatment based on the latest standards of care / guidelines;  discussion about incorporating lifestyle medicine;  and documenting the encounter. Risk reduction counseling performed per USPSTF guidelines to reduce obesity and cardiovascular risk factors.     Please refer to Patient Instructions for Blood Glucose  Monitoring and Insulin/Medications Dosing Guide"  in media tab for additional information. Please  also refer to " Patient Self Inventory" in the Media  tab for reviewed elements of pertinent patient history.  Michelle Clements participated in the discussions, expressed understanding, and voiced agreement with the above plans.  All questions were answered to her satisfaction. she is encouraged to contact clinic should she have any questions or concerns prior to her return visit.   Follow up plan: - Return in about 4 months (around 03/07/2024) for Diabetes F/U with A1c in office, Thyroid follow up, Previsit labs, Bring meter and logs.  Ronny Bacon, [facility] [address] [place], [place] [address] Phone: [phone] Fax: [phone]  11/07/2023, 10:56 AM

## 2023-11-10 ENCOUNTER — Other Ambulatory Visit: Payer: PPO

## 2023-11-10 DIAGNOSIS — M65311 Trigger thumb, right thumb: Secondary | ICD-10-CM | POA: Diagnosis not present

## 2023-11-11 DIAGNOSIS — M65311 Trigger thumb, right thumb: Secondary | ICD-10-CM | POA: Insufficient documentation

## 2023-11-13 ENCOUNTER — Other Ambulatory Visit (HOSPITAL_COMMUNITY): Payer: Self-pay

## 2023-11-13 ENCOUNTER — Encounter: Payer: Self-pay | Admitting: Nurse Practitioner

## 2023-11-13 ENCOUNTER — Ambulatory Visit (HOSPITAL_COMMUNITY)
Admission: RE | Admit: 2023-11-13 | Discharge: 2023-11-13 | Disposition: A | Discharge status: Home / Self Care | Payer: PPO | Source: Ambulatory Visit | Attending: Neurology | Admitting: Neurology

## 2023-11-13 ENCOUNTER — Other Ambulatory Visit: Payer: Self-pay

## 2023-11-13 ENCOUNTER — Encounter (HOSPITAL_COMMUNITY): Payer: Self-pay | Admitting: Psychiatry

## 2023-11-13 ENCOUNTER — Telehealth (INDEPENDENT_AMBULATORY_CARE_PROVIDER_SITE_OTHER): Payer: PPO | Admitting: Psychiatry

## 2023-11-13 DIAGNOSIS — F319 Bipolar disorder, unspecified: Secondary | ICD-10-CM

## 2023-11-13 DIAGNOSIS — G43719 Chronic migraine without aura, intractable, without status migrainosus: Secondary | ICD-10-CM | POA: Insufficient documentation

## 2023-11-13 DIAGNOSIS — G4486 Cervicogenic headache: Secondary | ICD-10-CM | POA: Insufficient documentation

## 2023-11-13 DIAGNOSIS — F332 Major depressive disorder, recurrent severe without psychotic features: Secondary | ICD-10-CM

## 2023-11-13 DIAGNOSIS — J341 Cyst and mucocele of nose and nasal sinus: Secondary | ICD-10-CM | POA: Diagnosis not present

## 2023-11-13 MED ORDER — LAMOTRIGINE 25 MG PO TABS
25.0000 mg | ORAL_TABLET | Freq: Two times a day (BID) | ORAL | 2 refills | Status: DC
  Filled 2023-11-13 – 2023-11-20 (×4): qty 60, 30d supply, fill #0
  Filled 2023-12-05 – 2023-12-18 (×3): qty 60, 30d supply, fill #1
  Filled 2024-01-08 – 2024-01-16 (×3): qty 60, 30d supply, fill #2

## 2023-11-13 MED ORDER — HYDROXYZINE HCL 10 MG PO TABS
10.0000 mg | ORAL_TABLET | Freq: Two times a day (BID) | ORAL | 2 refills | Status: DC | PRN
  Filled 2023-11-13 – 2023-11-14 (×2): qty 60, 30d supply, fill #0

## 2023-11-13 MED ORDER — CLONAZEPAM 0.5 MG PO TABS
ORAL_TABLET | ORAL | 2 refills | Status: DC
  Filled 2023-11-13 – 2023-11-14 (×2): qty 90, 30d supply, fill #0
  Filled 2023-12-09 – 2023-12-12 (×2): qty 90, 30d supply, fill #1
  Filled 2024-01-25: qty 90, 30d supply, fill #2

## 2023-11-13 MED ORDER — FLUOXETINE HCL 40 MG PO CAPS
40.0000 mg | ORAL_CAPSULE | Freq: Every day | ORAL | 2 refills | Status: DC
  Filled 2023-11-13 – 2023-11-20 (×4): qty 30, 30d supply, fill #0
  Filled 2023-12-05 – 2023-12-18 (×3): qty 30, 30d supply, fill #1
  Filled 2024-01-08 – 2024-01-16 (×3): qty 30, 30d supply, fill #2

## 2023-11-13 MED ORDER — TRAZODONE HCL 50 MG PO TABS
50.0000 mg | ORAL_TABLET | Freq: Every day | ORAL | 2 refills | Status: DC
  Filled 2023-11-13 – 2023-11-20 (×4): qty 30, 30d supply, fill #0
  Filled 2023-12-05 – 2023-12-18 (×3): qty 30, 30d supply, fill #1
  Filled 2024-01-08 – 2024-01-16 (×3): qty 30, 30d supply, fill #2

## 2023-11-13 MED ORDER — GADOBUTROL 1 MMOL/ML IV SOLN
10.0000 mL | Freq: Once | INTRAVENOUS | Status: AC | PRN
  Administered 2023-11-13: 10 mL via INTRAVENOUS

## 2023-11-13 MED ORDER — BUPROPION HCL ER (XL) 150 MG PO TB24
150.0000 mg | ORAL_TABLET | Freq: Every morning | ORAL | 2 refills | Status: DC
  Filled 2023-11-13 – 2023-11-20 (×4): qty 30, 30d supply, fill #0
  Filled 2023-12-05 – 2023-12-18 (×3): qty 30, 30d supply, fill #1
  Filled 2024-01-08 – 2024-01-16 (×3): qty 30, 30d supply, fill #2

## 2023-11-13 NOTE — Progress Notes (Signed)
Virtual Visit via Video Note  I connected with Michelle Clements on 11/13/23 at 11:20 AM EST by a video enabled telemedicine application and verified that I am speaking with the correct person using two identifiers.  Location: Patient: home Provider: office   I discussed the limitations of evaluation and management by telemedicine and the availability of in person appointments. The patient expressed understanding and agreed to proceed.     I discussed the assessment and treatment plan with the patient. The patient was provided an opportunity to ask questions and all were answered. The patient agreed with the plan and demonstrated an understanding of the instructions.   The patient was advised to call back or seek an in-person evaluation if the symptoms worsen or if the condition fails to improve as anticipated.  I provided 20 minutes of non-face-to-face time during this encounter.   Diannia Ruder, MD  [facility] MD/PA/NP OP Progress Note  11/13/2023 11:48 AM Michelle Clements  MRN:  [id number]  Chief Complaint:  Chief Complaint  Patient presents with   Depression   Anxiety   Follow-up   HPI: This patient is a 57 year old married white female who lives with her husband in [place]. She has 2 grown daughters. 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   The patient returns for follow-up after 3 months regarding her depression manic symptoms and anxiety.  She states overall she is doing okay.  She is working with a Paramedic.  She still has some breakthrough panic attacks particularly when she has to leave the house or when people are coming over.  She gets very perfectionistic about the state of her house and I urged her to drop down her expectations and not be so hard on herself.  She is watching her 64-month-old grandson 3 days a week and is enjoying this.  She denies significant depression or thoughts of self-harm.  She is sleeping well.  She still is struggling with migraine headaches and back  pain Visit Diagnosis:    ICD-10-CM   1. Bipolar 1 disorder (HCC)  F31.9     2. 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 She was also admitted for detox several years ago   Past Medical History:  Past Medical History:  Diagnosis Date   Allergic rhinitis    Anemia    Arthritis    Lower back and hips   Asthma    Bipolar disorder (HCC)    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HCC)    Constipation    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HCC)    Depression    Essential hypertension, benign    Fibromyalgia    GERD (gastroesophageal reflux disease)    H/O sleep apnea    Hip pain, left    History of palpitations    Negative Holter monitor   History of pneumonia 1988   Hyperlipidemia    IBS (irritable bowel syndrome)    Neck pain    Poor short term memory    PTSD (post-traumatic stress disorder)    S/P Botox injection 11/2014    For migraine headaches   Type 2 diabetes mellitus (HCC)    Urgency of urination     Past Surgical History:  Procedure Laterality Date   CARPAL TUNNEL RELEASE Right 06/10/2013   Procedure: CARPAL TUNNEL RELEASE;  Surgeon: Reinaldo Meeker, MD;  Location: [facility];  Service: Neurosurgery;  Laterality: Right;  Right Carpal Tunnel Release    CHOLECYSTECTOMY     DENTAL SURGERY  12/2014   mole exc     x 3   MUSCLE BIOPSY  2008   Right leg   TUBAL LIGATION      Family Psychiatric History: See below  Family History:  Family History  Problem Relation Age of Onset   Fibromyalgia Mother    Diabetes type II Mother    Depression Mother    Alzheimer's disease Mother    GER disease Mother    Heart disease Mother    Paranoid behavior Mother    Dementia Mother    Heart attack Father 13       MI x5   Stroke Father        CVA x7   Asthma Father    Brain cancer Father    Seizures Father     Anxiety disorder Sister    OCD Sister    Sexual abuse Sister    Physical abuse Sister    Anxiety disorder Sister    ADD / ADHD Daughter    ADD / ADHD Daughter    Alcohol abuse Neg Hx    Drug abuse Neg Hx    Schizophrenia Neg Hx     Social History:  Social History   Socioeconomic History   Marital status: Married    Spouse name: Not on file   Number of children: 2   Years of education: College   Highest education level: Associate degree: academic program  Occupational History   Occupation: Chiropractor: UNEMPLOYED    Comment: Now disabled due to bipolar and fibromyalgia  Tobacco Use   Smoking status: Former    Current packs/day: 0.00    Types: Cigarettes    Quit date: 11/04/2010    Years since quitting: 13.0   Smokeless tobacco: Never  Vaping Use   Vaping status: Never Used  Substance and Sexual Activity   Alcohol use: Yes    Comment: one mixed drink per month   Drug use: No   Sexual activity: Yes    Birth control/protection: Surgical  Other Topics Concern   Not on file  Social History Narrative   Married   Lives with spouse and daughter   Right handed.   Caffeine use: 2 cups coffee in morning and 2 cups tea during the day    Social Determinants of Health   Financial Resource Strain: Low Risk  (07/27/2021)   Overall Financial Resource Strain (CARDIA)    Difficulty of Paying Living Expenses: Not very hard  Food Insecurity: No Food Insecurity (07/27/2021)   Hunger Vital Sign    Worried About Running Out of Food in the Last Year: Never true    Ran Out of Food in the Last Year: Never true  Transportation Needs: No Transportation Needs (07/27/2021)   PRAPARE - Administrator, Civil Service (Medical): No    Lack of Transportation (Non-Medical): No  Physical Activity: Inactive (07/27/2021)   Exercise Vital Sign    Days of Exercise per Week: 0 days    Minutes of Exercise per Session: 0 min  Stress: No Stress Concern Present (07/27/2021)    [employer] of Occupational Health - Occupational Stress Questionnaire    Feeling of Stress : Not at all  Social Connections: Unknown (04/18/2022)   [facility]   Social Network  Social Network: Not on file    Allergies:  Allergies  Allergen Reactions   Molds & Smuts Shortness Of Breath    wheezing   Other Anaphylaxis, Shortness Of Breath and Other (See Comments)     : Angioedema wheezing Wheezing wheezing Other reaction(s): Cough Wheezing Itching and rash Wheezing wheezing Wheezing wheezing Other reaction(s): Other (See Comments)  : Angioedema wheezing Wheezing wheezing Other reaction(s): Cough Wheezing Itching and rash Wheezing wheezing Other reaction(s): Other (See Comments) Wheezing wheezing  : Angioedema wheezing Wheezing wheezing Other reaction(s): Cough Wheezing Itching and rash Wheezing wheezing Wheezing wheezing   Sulfa Antibiotics Anaphylaxis     : Angioedema    Sulfamethoxazole-Trimethoprim Anaphylaxis   Sulfonamide Derivatives Anaphylaxis     : Angioedema   Trichophyton Shortness Of Breath    wheezing wheezing wheezing wheezing wheezing wheezing wheezing wheezing wheezing   Carbamazepine Rash   Dust Mite Extract Cough and Other (See Comments)    Wheezing Other reaction(s): Wheezing Wheezing Wheezing Other reaction(s): Wheezing Wheezing Wheezing Other reaction(s): Wheezing Wheezing   Gluten Meal Itching   Quetiapine Fumarate Other (See Comments)    Metabolic Disorder Labs: Lab Results  Component Value Date   HGBA1C 6.2 (A) 11/07/2023   MPG 131 03/18/2015   No results found for: "PROLACTIN" Lab Results  Component Value Date   CHOL 196 08/06/2021   TRIG 199 (A) 03/31/2022   HDL 54 08/06/2021   CHOLHDL 4.2 03/18/2015   VLDL 44 (H) 03/18/2015   LDLCALC 126 03/31/2022   LDLCALC 117 08/06/2021   Lab Results  Component Value Date   TSH 1.030 10/27/2023   TSH 1.920 02/03/2023     Therapeutic Level Labs: Lab Results  Component Value Date   LITHIUM 0.20 (L) 02/21/2012   No results found for: "VALPROATE" No results found for: "CBMZ"  Current Medications: Current Outpatient Medications  Medication Sig Dispense Refill   hydrOXYzine (ATARAX) 10 MG tablet Take 1 tablet (10 mg total) by mouth 2 (two) times daily as needed. 60 tablet 2   albuterol (VENTOLIN HFA) 108 (90 Base) MCG/ACT inhaler albuterol sulfate HFA 90 mcg/actuation aerosol inhaler  INHALE TWO PUFFS BY MOUTH INTO LUNGS EVERY 6 HOURS AS NEEDED     albuterol (VENTOLIN HFA) 108 (90 Base) MCG/ACT inhaler Inhale 2 puffs into the lungs every 6 (six) hours as needed FOR SHORTNESS OF BREATH AND WHEEZING 6.7 g 5   albuterol (VENTOLIN HFA) 108 (90 Base) MCG/ACT inhaler Inhale 2 puffs into the lungs every 6 (six) hours as needed for wheezing and/or shortness of breath. 6.7 g 5   aspirin EC 81 MG tablet Take 1 tablet every day by oral route.     azelastine (ASTELIN) 0.1 % nasal spray Place 1 spray into both nostrils 2 (two) times daily. 30 mL 2   Blood Glucose Monitoring Suppl (ONETOUCH VERIO FLEX SYSTEM) w/Device KIT Use as directed to check blood glucose twice daily, before breakfast and before bed. 1 kit 0   buPROPion (WELLBUTRIN XL) 150 MG 24 hr tablet Take 1 tablet (150 mg total) by mouth every morning. 30 tablet 2   cetirizine (ZYRTEC) 10 MG tablet Take 10 mg by mouth at bedtime.     Cholecalciferol (VITAMIN D) 50 MCG (2000 UT) tablet Take 2,000 Units by mouth daily.     clonazePAM (KLONOPIN) 0.5 MG tablet Take 1 tablet (0.5 mg total) every morning AND 2 tablets (1 mg total) at bedtime. 90 tablet 2   Continuous Glucose Sensor (FREESTYLE LIBRE 3 SENSOR)  MISC Change sensor every 14 days (Patient not taking: Reported on 11/07/2023) 6 each 1   Continuous Glucose Sensor (FREESTYLE LIBRE 3 SENSOR) MISC Change sensor every 14 days 6 each 1   Continuous Glucose Sensor (FREESTYLE LIBRE 3 SENSOR) MISC Apply new sensor  every 14 days to monitor blood glucose. Remove old sensor before applying new one. 6 each 3   cycloSPORINE (RESTASIS) 0.05 % ophthalmic emulsion Apply to eye.     cycloSPORINE (RESTASIS) 0.05 % ophthalmic emulsion Place 1 drop into both eyes 2 (two) times daily. 180 each 99   estradiol (ESTRACE) 0.1 MG/GM vaginal cream Place vaginally.     FLUoxetine (PROZAC) 40 MG capsule Take 1 capsule (40 mg total) by mouth daily. 30 capsule 2   fluticasone (FLONASE) 50 MCG/ACT nasal spray Place 1 spray into both nostrils daily.     Galcanezumab-gnlm (EMGALITY) 120 MG/ML SOSY Inject 1 mL into the skin every 30 (thirty) days. 1 mL 11   Glucos-Chond-Hyal Ac-Ca Fructo (MOVE FREE JOINT HEALTH ADVANCE PO) Take by mouth.     Immune Globulin, Human,-klhw (XEMBIFY) 10 GM/50ML SOLN Inject 10 g into the skin every 7 (seven) days. 200 mL 3   insulin glargine (LANTUS SOLOSTAR) 100 UNIT/ML Solostar Pen Inject 50 Units into the skin daily. 15 mL 3   insulin glargine (LANTUS) 100 UNIT/ML Solostar Pen Inject 50 Units into the skin at bedtime. 45 mL 1   insulin glargine (LANTUS) 100 UNIT/ML Solostar Pen Inject 50 Units into the skin at bedtime. 45 mL 3   ipratropium-albuterol (DUONEB) 0.5-2.5 (3) MG/3ML SOLN Take 3 mLs by nebulization 2 (two) times daily as needed. 90 mL 2   lamoTRIgine (LAMICTAL) 25 MG tablet Take 1 tablet (25 mg total) by mouth 2 (two) times daily. 60 tablet 2   Lancets (ONETOUCH ULTRASOFT) lancets 1 each by Other route as needed.      Latanoprostene Bunod (VYZULTA) 0.024 % SOLN Place 1 drop into both eyes at bedtime as directed 7.5 mL 3   lidocaine (LIDODERM) 5 % lidocaine 5 % topical patch  APPLY 1 PATCH BY TOPICAL ROUTE ONCE DAILY (MAY WEAR UP TO 12HOURS.)     lidocaine (LIDODERM) 5 % Place 1 patch onto the skin daily, may wear up to 12 hours 30 patch 5   lubiprostone (AMITIZA) 24 MCG capsule Take 24 mcg by mouth 2 (two) times daily.     lubiprostone (AMITIZA) 24 MCG capsule Take 1 capsule (24 mcg total)  by mouth 2 (two) times daily. 60 capsule 3   Melatonin 10 MG TABS Take 1 tablet every day by oral route at bedtime.     Multiple Vitamin (MULTIVITAMIN) tablet Take 1 tablet by mouth daily.     omeprazole (PRILOSEC) 20 MG capsule Take 20 mg by mouth daily.     omeprazole (PRILOSEC) 20 MG capsule Take 1 capsule (20 mg total) by mouth 2 (two) times daily. 60 capsule 2   omeprazole (PRILOSEC) 20 MG capsule Take 1 capsule (20 mg total) by mouth 2 (two) times daily. 60 capsule 1   ondansetron (ZOFRAN-ODT) 8 MG disintegrating tablet Dissolve 1 tablet (8 mg total) on tongue every 8 (eight) hours as needed for nausea or vomiting. 20 tablet 6   ONETOUCH VERIO test strip 4 (four) times daily.     pregabalin (LYRICA) 200 MG capsule Take 1 capsule (200 mg total) by mouth 2 (two) times daily. 60 capsule 2   pregabalin (LYRICA) 200 MG capsule Take 1 capsule (200  mg total) by mouth 2 (two) times daily. 90 capsule 2   Rimegepant Sulfate (NURTEC) 75 MG TBDP Take 75mg  once daily as needed for headache 8 tablet 11   rosuvastatin (CRESTOR) 40 MG tablet Take 1 tablet (40 mg total) by mouth at bedtime. 90 tablet 1   rosuvastatin (CRESTOR) 40 MG tablet Take 1 tablet (40 mg total) by mouth at bedtime. 90 tablet 1   sennosides-docusate sodium (SENOKOT-S) 8.6-50 MG tablet Take 1 tablet by mouth daily.     tiZANidine (ZANAFLEX) 4 MG tablet Take 4 mg by mouth daily. 4mg  at bedtime and 2 mg during the day     tiZANidine (ZANAFLEX) 4 MG tablet Take 1 tablet (4 mg total) by mouth every 12 (twelve) hours as needed. 60 tablet 11   tiZANidine (ZANAFLEX) 4 MG tablet Take 1 tablet (4 mg total) by mouth at bedtime. 480 tablet 0   traZODone (DESYREL) 50 MG tablet Take 1 tablet (50 mg total) by mouth at bedtime. 30 tablet 2   zinc gluconate 50 MG tablet Take 50 mg by mouth daily.     No current facility-administered medications for this visit.     Musculoskeletal: Strength & Muscle Tone: within normal limits Gait & Station:  normal Patient leans: N/A  Psychiatric Specialty Exam: Review of Systems  Musculoskeletal:  Positive for back pain.  Neurological:  Positive for headaches.  Psychiatric/Behavioral:  The patient is nervous/anxious.   All other systems reviewed and are negative.   Last menstrual period 01/04/2018.There is no height or weight on file to calculate BMI.  General Appearance: Casual and Fairly Groomed  Eye Contact:  Good  Speech:  Clear and Coherent  Volume:  Normal  Mood:  Anxious and Euthymic  Affect:  Congruent  Thought Process:  Goal Directed  Orientation:  Full (Time, Place, and Person)  Thought Content: Rumination   Suicidal Thoughts:  No  Homicidal Thoughts:  No  Memory:  Immediate;   Good Recent;   Good Remote;   NA  Judgement:  Good  Insight:  Good  Psychomotor Activity:  Decreased  Concentration:  Concentration: Good and Attention Span: Good  Recall:  Good  Fund of Knowledge: Good  Language: Good  Akathisia:  No  Handed:  Right  AIMS (if indicated): not done  Assets:  Communication Skills Desire for Improvement Resilience Social Support  ADL's:  Intact  Cognition: WNL  Sleep:  Good   Screenings: Mini-Mental    Flowsheet Row Office Visit from 08/29/2019 in [facility]  Total Score (max 30 points ) 27      PHQ2-9    Flowsheet Row Video Visit from 11/04/2022 in [facility] [place] Video Visit from 08/23/2022 in [facility] [place] Video Visit from 06/20/2022 in [facility] [place] Video Visit from 05/20/2022 in [facility] [place] Video Visit from 03/16/2022 in [facility] Total Score 2 0 0 2 0  PHQ-9 Total Score 10 -- -- 7 0      Flowsheet Row Video Visit from 11/04/2022 in [facility] [place] Video Visit from  08/23/2022 in [facility] at [place] Video Visit from 06/20/2022 in [facility] at [facility] RISK CATEGORY Error: Question 6 not populated No Risk No Risk        Assessment and Plan: This  patient is a 57 year old female with a history of PTSD possible bipolar disorder depression anxiety and hallucinations in the past.  She does still have a bit of anxiety so we will add hydroxyzine 10 mg up to twice daily as needed.  She will continue Wellbutrin XL 150 mg daily as well as for Prozac 40 mg daily for depression, trazodone 50 mg at bedtime for sleep, clonazepam 0.5 mg in the morning and 1 mg at bedtime for anxiety and Lamictal 25 mg twice daily for mood swings.  She will return to see me in 3 months  Collaboration of Care: Collaboration of Care: Primary Care Provider AEB notes to be shared with PCP at patient's request  Patient/Guardian was advised Release of Information must be obtained prior to any record release in order to collaborate their care with an outside provider. Patient/Guardian was advised if they have not already done so to contact the registration department to sign all necessary forms in order for [place] to release information regarding their care.   Consent: Patient/Guardian gives verbal consent for treatment and assignment of benefits for services provided during this visit. Patient/Guardian expressed understanding and agreed to proceed.    Diannia Ruder, MD 11/13/2023, 11:48 AM

## 2023-11-14 ENCOUNTER — Encounter (HOSPITAL_COMMUNITY): Payer: Self-pay | Admitting: Hematology & Oncology

## 2023-11-14 ENCOUNTER — Other Ambulatory Visit: Payer: Self-pay

## 2023-11-14 ENCOUNTER — Other Ambulatory Visit (HOSPITAL_COMMUNITY): Payer: Self-pay

## 2023-11-15 ENCOUNTER — Other Ambulatory Visit (HOSPITAL_COMMUNITY): Payer: Self-pay

## 2023-11-15 ENCOUNTER — Telehealth (HOSPITAL_COMMUNITY): Payer: Self-pay

## 2023-11-15 ENCOUNTER — Encounter: Payer: Self-pay | Admitting: Physician Assistant

## 2023-11-15 ENCOUNTER — Other Ambulatory Visit: Payer: Self-pay

## 2023-11-15 ENCOUNTER — Other Ambulatory Visit: Payer: Self-pay | Admitting: Neurology

## 2023-11-15 DIAGNOSIS — G43709 Chronic migraine without aura, not intractable, without status migrainosus: Secondary | ICD-10-CM

## 2023-11-15 MED ORDER — LUBIPROSTONE 24 MCG PO CAPS
24.0000 ug | ORAL_CAPSULE | Freq: Two times a day (BID) | ORAL | 3 refills | Status: DC
  Filled 2023-11-15 – 2023-11-20 (×3): qty 60, 30d supply, fill #0
  Filled 2023-12-05 – 2023-12-18 (×3): qty 60, 30d supply, fill #1
  Filled 2024-01-08 – 2024-01-16 (×3): qty 60, 30d supply, fill #2
  Filled 2024-01-29 – 2024-02-13 (×2): qty 60, 30d supply, fill #3
  Filled ????-??-??: fill #3

## 2023-11-15 MED ORDER — OMEPRAZOLE 20 MG PO CPDR
20.0000 mg | DELAYED_RELEASE_CAPSULE | Freq: Two times a day (BID) | ORAL | 2 refills | Status: DC
  Filled 2023-11-15 – 2023-11-20 (×3): qty 60, 30d supply, fill #0
  Filled 2023-12-05 – 2023-12-18 (×3): qty 60, 30d supply, fill #1
  Filled 2024-01-08 – 2024-01-16 (×3): qty 60, 30d supply, fill #2

## 2023-11-15 NOTE — Progress Notes (Signed)
LAB REVIEW: Labs received from [facility] that were obtained on 11/07/2023 prior to subcutaneous IgG Xembify treatment. Immunoglobulin trough levels are at goal with IgG 773 (goal is IgG >600). IgA remains low at 45.  IgM remains low at 12. We will plan on checking trough levels of immunoglobulins again prior to next appointment. MyChart message sent to patient to discuss the above.  Carnella Guadalajara, PA-C 11/15/23 11:12 AM

## 2023-11-15 NOTE — Telephone Encounter (Signed)
Called pt no answer left vm 

## 2023-11-15 NOTE — Telephone Encounter (Signed)
Spoke with pt advised pt of Dr Charlott Rakes message she verbalized understanding

## 2023-11-15 NOTE — Telephone Encounter (Signed)
Pt called in stating that she has been having headaches and her neurologist has prescribed her Depakote for 4 days. Pt is wanting to know if she needs to stop her lamoTRIgine (LAMICTAL) 25 MG tablet for the 4 days while taking the Depakote. Please advise.

## 2023-11-16 ENCOUNTER — Encounter: Payer: Self-pay | Admitting: Nurse Practitioner

## 2023-11-16 ENCOUNTER — Other Ambulatory Visit (HOSPITAL_COMMUNITY): Payer: Self-pay

## 2023-11-17 ENCOUNTER — Other Ambulatory Visit (HOSPITAL_BASED_OUTPATIENT_CLINIC_OR_DEPARTMENT_OTHER): Payer: Self-pay

## 2023-11-17 ENCOUNTER — Other Ambulatory Visit (HOSPITAL_COMMUNITY): Payer: Self-pay

## 2023-11-17 ENCOUNTER — Encounter (HOSPITAL_COMMUNITY): Payer: Self-pay | Admitting: Hematology & Oncology

## 2023-11-18 ENCOUNTER — Other Ambulatory Visit: Payer: Self-pay

## 2023-11-18 ENCOUNTER — Other Ambulatory Visit (HOSPITAL_COMMUNITY): Payer: Self-pay

## 2023-11-20 ENCOUNTER — Other Ambulatory Visit: Payer: PPO

## 2023-11-20 ENCOUNTER — Inpatient Hospital Stay: Admission: RE | Admit: 2023-11-20 | Payer: PPO | Source: Ambulatory Visit

## 2023-11-20 ENCOUNTER — Other Ambulatory Visit: Payer: Self-pay

## 2023-11-21 ENCOUNTER — Other Ambulatory Visit: Payer: Self-pay

## 2023-11-21 ENCOUNTER — Other Ambulatory Visit (HOSPITAL_COMMUNITY): Payer: Self-pay

## 2023-11-22 ENCOUNTER — Other Ambulatory Visit (HOSPITAL_COMMUNITY): Payer: Self-pay

## 2023-11-22 NOTE — Discharge Instructions (Signed)

## 2023-11-23 ENCOUNTER — Ambulatory Visit
Admission: RE | Admit: 2023-11-23 | Discharge: 2023-11-23 | Disposition: A | Discharge status: Home / Self Care | Payer: PPO | Source: Ambulatory Visit | Attending: Neurosurgery | Admitting: Neurosurgery

## 2023-11-23 ENCOUNTER — Other Ambulatory Visit: Payer: Self-pay

## 2023-11-23 ENCOUNTER — Ambulatory Visit
Admission: RE | Admit: 2023-11-23 | Discharge: 2023-11-23 | Disposition: A | Discharge status: Home / Self Care | Payer: PPO | Source: Ambulatory Visit | Attending: Neurology | Admitting: Neurology

## 2023-11-23 ENCOUNTER — Other Ambulatory Visit (HOSPITAL_COMMUNITY): Payer: Self-pay

## 2023-11-23 VITALS — BP 123/58 | HR 60

## 2023-11-23 DIAGNOSIS — M4722 Other spondylosis with radiculopathy, cervical region: Secondary | ICD-10-CM | POA: Diagnosis not present

## 2023-11-23 DIAGNOSIS — M546 Pain in thoracic spine: Secondary | ICD-10-CM

## 2023-11-23 DIAGNOSIS — M5412 Radiculopathy, cervical region: Secondary | ICD-10-CM

## 2023-11-23 DIAGNOSIS — E559 Vitamin D deficiency, unspecified: Secondary | ICD-10-CM | POA: Diagnosis not present

## 2023-11-23 DIAGNOSIS — E782 Mixed hyperlipidemia: Secondary | ICD-10-CM | POA: Diagnosis not present

## 2023-11-23 DIAGNOSIS — M4803 Spinal stenosis, cervicothoracic region: Secondary | ICD-10-CM | POA: Diagnosis not present

## 2023-11-23 DIAGNOSIS — M48061 Spinal stenosis, lumbar region without neurogenic claudication: Secondary | ICD-10-CM | POA: Diagnosis not present

## 2023-11-23 DIAGNOSIS — G43709 Chronic migraine without aura, not intractable, without status migrainosus: Secondary | ICD-10-CM | POA: Diagnosis not present

## 2023-11-23 DIAGNOSIS — M4802 Spinal stenosis, cervical region: Secondary | ICD-10-CM | POA: Diagnosis not present

## 2023-11-23 DIAGNOSIS — M4804 Spinal stenosis, thoracic region: Secondary | ICD-10-CM | POA: Diagnosis not present

## 2023-11-23 DIAGNOSIS — M47816 Spondylosis without myelopathy or radiculopathy, lumbar region: Secondary | ICD-10-CM

## 2023-11-23 DIAGNOSIS — M47814 Spondylosis without myelopathy or radiculopathy, thoracic region: Secondary | ICD-10-CM | POA: Diagnosis not present

## 2023-11-23 LAB — CSF CELL COUNT WITH DIFFERENTIAL
RBC Count, CSF: 0 {cells}/uL
TOTAL NUCLEATED CELL: 2 {cells}/uL (ref 0–5)

## 2023-11-23 LAB — GLUCOSE, CSF: Glucose, CSF: 83 mg/dL — ABNORMAL HIGH (ref 40–80)

## 2023-11-23 LAB — PROTEIN, CSF: Total Protein, CSF: 30 mg/dL (ref 15–45)

## 2023-11-23 MED ORDER — MEPERIDINE HCL 50 MG/ML IJ SOLN: 50.0000 mg | Freq: Once | INTRAMUSCULAR | Status: DC | PRN

## 2023-11-23 MED ORDER — IOPAMIDOL (ISOVUE-M 300) INJECTION 61%
10.0000 mL | Freq: Once | INTRAMUSCULAR | Status: AC | PRN
  Administered 2023-11-23: 10 mL via INTRATHECAL

## 2023-11-23 MED ORDER — ONDANSETRON HCL 4 MG/2ML IJ SOLN: 4.0000 mg | Freq: Once | INTRAMUSCULAR | Status: DC | PRN

## 2023-11-23 NOTE — Discharge Instructions (Signed)

## 2023-11-24 ENCOUNTER — Other Ambulatory Visit: Payer: Self-pay

## 2023-11-27 ENCOUNTER — Other Ambulatory Visit (HOSPITAL_COMMUNITY): Payer: Self-pay

## 2023-11-27 ENCOUNTER — Encounter (HOSPITAL_COMMUNITY): Payer: Self-pay | Admitting: Hematology & Oncology

## 2023-11-27 ENCOUNTER — Other Ambulatory Visit: Payer: Self-pay

## 2023-12-04 DIAGNOSIS — Z0001 Encounter for general adult medical examination with abnormal findings: Secondary | ICD-10-CM | POA: Diagnosis not present

## 2023-12-04 DIAGNOSIS — M2550 Pain in unspecified joint: Secondary | ICD-10-CM | POA: Diagnosis not present

## 2023-12-04 DIAGNOSIS — G43109 Migraine with aura, not intractable, without status migrainosus: Secondary | ICD-10-CM | POA: Diagnosis not present

## 2023-12-04 DIAGNOSIS — R296 Repeated falls: Secondary | ICD-10-CM | POA: Diagnosis not present

## 2023-12-04 DIAGNOSIS — E782 Mixed hyperlipidemia: Secondary | ICD-10-CM | POA: Diagnosis not present

## 2023-12-04 DIAGNOSIS — D839 Common variable immunodeficiency, unspecified: Secondary | ICD-10-CM | POA: Diagnosis not present

## 2023-12-04 DIAGNOSIS — G894 Chronic pain syndrome: Secondary | ICD-10-CM | POA: Diagnosis not present

## 2023-12-04 DIAGNOSIS — R269 Unspecified abnormalities of gait and mobility: Secondary | ICD-10-CM | POA: Diagnosis not present

## 2023-12-04 DIAGNOSIS — Z0000 Encounter for general adult medical examination without abnormal findings: Secondary | ICD-10-CM | POA: Diagnosis not present

## 2023-12-04 DIAGNOSIS — M797 Fibromyalgia: Secondary | ICD-10-CM | POA: Diagnosis not present

## 2023-12-04 DIAGNOSIS — I10 Essential (primary) hypertension: Secondary | ICD-10-CM | POA: Diagnosis not present

## 2023-12-04 DIAGNOSIS — I700 Atherosclerosis of aorta: Secondary | ICD-10-CM | POA: Insufficient documentation

## 2023-12-05 ENCOUNTER — Other Ambulatory Visit: Payer: Self-pay

## 2023-12-05 ENCOUNTER — Other Ambulatory Visit (HOSPITAL_COMMUNITY): Payer: Self-pay

## 2023-12-05 ENCOUNTER — Ambulatory Visit (INDEPENDENT_AMBULATORY_CARE_PROVIDER_SITE_OTHER): Payer: PPO | Admitting: Internal Medicine

## 2023-12-05 VITALS — BP 110/60 | HR 80 | Ht 71.0 in | Wt 239.0 lb

## 2023-12-05 DIAGNOSIS — N1831 Chronic kidney disease, stage 3a: Secondary | ICD-10-CM | POA: Diagnosis not present

## 2023-12-05 DIAGNOSIS — F32A Depression, unspecified: Secondary | ICD-10-CM

## 2023-12-05 DIAGNOSIS — D801 Nonfamilial hypogammaglobulinemia: Secondary | ICD-10-CM

## 2023-12-05 DIAGNOSIS — D839 Common variable immunodeficiency, unspecified: Secondary | ICD-10-CM

## 2023-12-05 DIAGNOSIS — G43709 Chronic migraine without aura, not intractable, without status migrainosus: Secondary | ICD-10-CM

## 2023-12-05 DIAGNOSIS — G894 Chronic pain syndrome: Secondary | ICD-10-CM | POA: Diagnosis not present

## 2023-12-05 DIAGNOSIS — E782 Mixed hyperlipidemia: Secondary | ICD-10-CM | POA: Diagnosis not present

## 2023-12-05 DIAGNOSIS — G4733 Obstructive sleep apnea (adult) (pediatric): Secondary | ICD-10-CM

## 2023-12-05 DIAGNOSIS — E1122 Type 2 diabetes mellitus with diabetic chronic kidney disease: Secondary | ICD-10-CM | POA: Diagnosis not present

## 2023-12-05 DIAGNOSIS — K580 Irritable bowel syndrome with diarrhea: Secondary | ICD-10-CM | POA: Diagnosis not present

## 2023-12-05 DIAGNOSIS — M5489 Other dorsalgia: Secondary | ICD-10-CM

## 2023-12-05 DIAGNOSIS — I10 Essential (primary) hypertension: Secondary | ICD-10-CM | POA: Diagnosis not present

## 2023-12-05 DIAGNOSIS — F419 Anxiety disorder, unspecified: Secondary | ICD-10-CM | POA: Diagnosis not present

## 2023-12-05 DIAGNOSIS — Z794 Long term (current) use of insulin: Secondary | ICD-10-CM

## 2023-12-05 DIAGNOSIS — N952 Postmenopausal atrophic vaginitis: Secondary | ICD-10-CM | POA: Diagnosis not present

## 2023-12-05 DIAGNOSIS — M461 Sacroiliitis, not elsewhere classified: Secondary | ICD-10-CM | POA: Insufficient documentation

## 2023-12-05 MED ORDER — ESTRADIOL 0.1 MG/GM VA CREA
1.0000 | TOPICAL_CREAM | VAGINAL | 1 refills | Status: AC
  Filled 2023-12-05: qty 42.5, 90d supply, fill #0
  Filled 2024-02-29: qty 42.5, 90d supply, fill #1

## 2023-12-05 NOTE — Patient Instructions (Signed)
 It was a pleasure to see you today.  Thank you for giving us  the opportunity to be involved in your care.  Below is a brief recap of your visit and next steps.  We will plan to see you again in 3 months.  Summary You have established care today No medication changes were made today Check lipid panel Follow up in 3 months for routine care Will also request an appointment for pap smear with one of our nurse practitioners in the interim

## 2023-12-05 NOTE — Progress Notes (Signed)
 New Patient Office Visit  Subjective    Patient ID: Michelle Clements, female    DOB: 03-28-1966  Age: 57 y.o. MRN: [id number]  CC:  Chief Complaint  Patient presents with   Establish Care    Patient is here to establish care. Complains of bowel and bladder incontinence. Hot flashes and nausea.     HPI Michelle Clements presents to establish care.  She is a 57 year old woman with a past medical history significant for chronic pain syndrome secondary to fibromyalgia and multijoint osteoarthritis, T2DM, CVID, migraines, CKD stage III, hyperlipidemia, IBS, and a psychiatric history notable for BPD, PTSD, and anxiety.  Previously followed by Zack Hall, MD. Ms. Sleep reports feeling fairly well today.  She endorses a chronic history of nausea and diarrhea.  She does not have any acute concerns to discuss aside from desiring to establish care.  She is currently disabled.  Denies tobacco and illicit drug use.  Endorses social alcohol consumption.  Family medical history significant for unspecified of brain cancer, CAD, CVA, diabetes mellitus, and dementia.  Chronic medical conditions and outstanding preventative care items discussed today are individually addressed in A/P below.   Outpatient Encounter Medications as of 12/05/2023  Medication Sig   albuterol  (VENTOLIN  HFA) 108 (90 Base) MCG/ACT inhaler Inhale 2 puffs into the lungs every 6 (six) hours as needed for wheezing and/or shortness of breath.   aspirin EC 81 MG tablet Take 1 tablet every day by oral route.   azelastine  (ASTELIN ) 0.1 % nasal spray Place 1 spray into both nostrils 2 (two) times daily.   Blood Glucose Monitoring Suppl (ONETOUCH VERIO FLEX SYSTEM) w/Device KIT Use as directed to check blood glucose twice daily, before breakfast and before bed.   buPROPion  (WELLBUTRIN  XL) 150 MG 24 hr tablet Take 1 tablet (150 mg total) by mouth every morning.   cetirizine  (ZYRTEC ) 10 MG tablet Take 10 mg by mouth at bedtime.   Cholecalciferol (VITAMIN  D) 50 MCG (2000 UT) tablet Take 2,000 Units by mouth daily.   clonazePAM  (KLONOPIN ) 0.5 MG tablet Take 1 tablet (0.5 mg total) every morning AND 2 tablets (1 mg total) at bedtime.   Continuous Glucose Sensor (FREESTYLE LIBRE 3 SENSOR) MISC Apply new sensor every 14 days to monitor blood glucose. Remove old sensor before applying new one.   cycloSPORINE  (RESTASIS ) 0.05 % ophthalmic emulsion Place 1 drop into both eyes 2 (two) times daily.   FLUoxetine  (PROZAC ) 40 MG capsule Take 1 capsule (40 mg total) by mouth daily.   fluticasone  (FLONASE ) 50 MCG/ACT nasal spray Place 1 spray into both nostrils daily.   Galcanezumab -gnlm (EMGALITY ) 120 MG/ML SOSY Inject 1 mL into the skin every 30 (thirty) days.   Glucos-Chond-Hyal Ac-Ca Fructo (MOVE FREE JOINT HEALTH ADVANCE PO) Take by mouth.   hydrOXYzine  (ATARAX ) 10 MG tablet Take 1 tablet (10 mg total) by mouth 2 (two) times daily as needed.   Immune Globulin , Human,-klhw (XEMBIFY ) 10 GM/50ML SOLN Inject 10 g into the skin every 7 (seven) days.   insulin  glargine (LANTUS ) 100 UNIT/ML Solostar Pen Inject 50 Units into the skin at bedtime.   ipratropium-albuterol  (DUONEB) 0.5-2.5 (3) MG/3ML SOLN Take 3 mLs by nebulization 2 (two) times daily as needed.   lamoTRIgine  (LAMICTAL ) 25 MG tablet Take 1 tablet (25 mg total) by mouth 2 (two) times daily.   Lancets (ONETOUCH ULTRASOFT) lancets 1 each by Other route as needed.    Latanoprostene Bunod  (VYZULTA ) 0.024 % SOLN Place 1 drop  into both eyes at bedtime as directed   lidocaine  (LIDODERM ) 5 % lidocaine  5 % topical patch  APPLY 1 PATCH BY TOPICAL ROUTE ONCE DAILY (MAY WEAR UP TO 12HOURS.)   lidocaine  (LIDODERM ) 5 % Place 1 patch onto the skin daily, may wear up to 12 hours   lubiprostone  (AMITIZA ) 24 MCG capsule Take 24 mcg by mouth 2 (two) times daily.   lubiprostone  (AMITIZA ) 24 MCG capsule Take 1 capsule (24 mcg total) by mouth 2 (two) times daily.   Melatonin 10 MG TABS Take 1 tablet every day by oral route  at bedtime.   Multiple Vitamin (MULTIVITAMIN) tablet Take 1 tablet by mouth daily.   omeprazole  (PRILOSEC) 20 MG capsule Take 20 mg by mouth daily.   omeprazole  (PRILOSEC) 20 MG capsule Take 1 capsule (20 mg total) by mouth 2 (two) times daily.   ondansetron  (ZOFRAN -ODT) 8 MG disintegrating tablet Dissolve 1 tablet (8 mg total) on tongue every 8 (eight) hours as needed for nausea or vomiting.   ONETOUCH VERIO test strip 4 (four) times daily.   pregabalin  (LYRICA ) 200 MG capsule Take 1 capsule (200 mg total) by mouth 2 (two) times daily.   Rimegepant Sulfate  (NURTEC) 75 MG TBDP Take 75mg  once daily as needed for headache   rosuvastatin  (CRESTOR ) 40 MG tablet Take 1 tablet (40 mg total) by mouth at bedtime.   sennosides-docusate sodium (SENOKOT-S) 8.6-50 MG tablet Take 1 tablet by mouth daily.   tiZANidine  (ZANAFLEX ) 4 MG tablet Take 4 mg by mouth daily. 4mg  at bedtime and 2 mg during the day   tiZANidine  (ZANAFLEX ) 4 MG tablet Take 1 tablet (4 mg total) by mouth at bedtime.   traZODone  (DESYREL ) 50 MG tablet Take 1 tablet (50 mg total) by mouth at bedtime.   zinc gluconate 50 MG tablet Take 50 mg by mouth daily.   [DISCONTINUED] albuterol  (VENTOLIN  HFA) 108 (90 Base) MCG/ACT inhaler albuterol  sulfate HFA 90 mcg/actuation aerosol inhaler  INHALE TWO PUFFS BY MOUTH INTO LUNGS EVERY 6 HOURS AS NEEDED   [DISCONTINUED] albuterol  (VENTOLIN  HFA) 108 (90 Base) MCG/ACT inhaler Inhale 2 puffs into the lungs every 6 (six) hours as needed FOR SHORTNESS OF BREATH AND WHEEZING   [DISCONTINUED] Continuous Glucose Sensor (FREESTYLE LIBRE 3 SENSOR) MISC Change sensor every 14 days   [DISCONTINUED] Continuous Glucose Sensor (FREESTYLE LIBRE 3 SENSOR) MISC Change sensor every 14 days   [DISCONTINUED] cyanocobalamin  100 MCG tablet Take by mouth.   [DISCONTINUED] cycloSPORINE  (RESTASIS ) 0.05 % ophthalmic emulsion Apply to eye.   [DISCONTINUED] estradiol  (ESTRACE ) 0.1 MG/GM vaginal cream Place vaginally.    [DISCONTINUED] insulin  glargine (LANTUS  SOLOSTAR) 100 UNIT/ML Solostar Pen Inject 50 Units into the skin daily.   [DISCONTINUED] insulin  glargine (LANTUS ) 100 UNIT/ML Solostar Pen Inject 50 Units into the skin at bedtime.   [DISCONTINUED] omeprazole  (PRILOSEC) 20 MG capsule Take 1 capsule (20 mg total) by mouth 2 (two) times daily.   [DISCONTINUED] pregabalin  (LYRICA ) 200 MG capsule Take 1 capsule (200 mg total) by mouth 2 (two) times daily.   [DISCONTINUED] rosuvastatin  (CRESTOR ) 40 MG tablet Take 1 tablet (40 mg total) by mouth at bedtime.   [DISCONTINUED] tiZANidine  (ZANAFLEX ) 4 MG tablet Take 1 tablet (4 mg total) by mouth every 12 (twelve) hours as needed.   estradiol  (ESTRACE ) 0.1 MG/GM vaginal cream Place 1 Applicatorful vaginally 3 (three) times a week.   [DISCONTINUED] rOPINIRole  (REQUIP ) 0.25 MG tablet Take 1 tablet (0.25 mg total) by mouth 2 (two) times daily.   [DISCONTINUED]  SUMAtriptan  (IMITREX ) 100 MG tablet Take 1 tablet by mouth as needed for migraine. If migraine continues, may repeat dose after 2 hours.   No facility-administered encounter medications on file as of 12/05/2023.    Past Medical History:  Diagnosis Date   Allergic rhinitis    Anemia    Arthritis    Lower back and hips   Asthma    Bipolar disorder (HCC)    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HCC)    Constipation    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HCC)    Depression    Essential hypertension, benign    Fibromyalgia    GERD (gastroesophageal reflux disease)    H/O sleep apnea    Hip pain, left    History of palpitations    Negative Holter monitor   History of pneumonia 1988   Hyperlipidemia    IBS (irritable bowel syndrome)    Neck pain    Obstructive sleep apnea (adult) (pediatric) 04/12/2021   Poor short term memory    PTSD (post-traumatic stress disorder)    S/P Botox injection 11/2014    For migraine headaches   Type 2 diabetes mellitus (HCC)    Urgency of urination     Past Surgical  History:  Procedure Laterality Date   CARPAL TUNNEL RELEASE Right 06/10/2013   Procedure: CARPAL TUNNEL RELEASE;  Surgeon: Darina MALVA Boehringer, MD;  Location: [facility];  Service: Neurosurgery;  Laterality: Right;  Right Carpal Tunnel Release    CHOLECYSTECTOMY     DENTAL SURGERY  12/2014   mole exc     x 3   MUSCLE BIOPSY  2008   Right leg   TUBAL LIGATION      Family History  Problem Relation Age of Onset   Fibromyalgia Mother    Diabetes type II Mother    Depression Mother    Alzheimer's disease Mother    GER disease Mother    Heart disease Mother    Paranoid behavior Mother    Dementia Mother    Heart attack Father 50       MI x5   Stroke Father        CVA x7   Asthma Father    Brain cancer Father    Seizures Father    Anxiety disorder Sister    OCD Sister    Sexual abuse Sister    Physical abuse Sister    Anxiety disorder Sister    ADD / ADHD Daughter    ADD / ADHD Daughter    Alcohol abuse Neg Hx    Drug abuse Neg Hx    Schizophrenia Neg Hx     Social History   Socioeconomic History   Marital status: Married    Spouse name: Not on file   Number of children: 2   Years of education: College   Highest education level: Associate degree: occupational, scientist, product/process development, or vocational program  Occupational History   Occupation: Chiropractor: UNEMPLOYED    Comment: Now disabled due to bipolar and fibromyalgia  Tobacco Use   Smoking status: Former    Current packs/day: 0.00    Types: Cigarettes    Quit date: 11/04/2010    Years since quitting: 13.1   Smokeless tobacco: Never  Vaping Use   Vaping status: Never Used  Substance and Sexual Activity   Alcohol use: Yes    Comment: one mixed drink per month   Drug use: No   Sexual activity: Yes    Birth  control/protection: Surgical  Other Topics Concern   Not on file  Social History Narrative   Married   Lives with spouse and daughter   Right handed.   Caffeine use: 2 cups coffee in morning and 2 cups  tea during the day    Social Drivers of Health   Financial Resource Strain: Medium Risk (12/05/2023)   Overall Financial Resource Strain (CARDIA)    Difficulty of Paying Living Expenses: Somewhat hard  Food Insecurity: No Food Insecurity (12/05/2023)   Hunger Vital Sign    Worried About Running Out of Food in the Last Year: Never true    Ran Out of Food in the Last Year: Never true  Transportation Needs: No Transportation Needs (12/05/2023)   PRAPARE - Administrator, Civil Service (Medical): No    Lack of Transportation (Non-Medical): No  Physical Activity: Unknown (12/05/2023)   Exercise Vital Sign    Days of Exercise per Week: 0 days    Minutes of Exercise per Session: Not on file  Stress: Stress Concern Present (12/05/2023)   [employer] of Occupational Health - Occupational Stress Questionnaire    Feeling of Stress : To some extent  Social Connections: Unknown (12/05/2023)   Social Connection and Isolation Panel [NHANES]    Frequency of Communication with Friends and Family: Twice a week    Frequency of Social Gatherings with Friends and Family: Patient declined    Attends Religious Services: More than 4 times per year    Active Member of [employer] or Organizations: Yes    Attends Banker Meetings: More than 4 times per year    Marital Status: Married  Catering Manager Violence: Unknown (03/10/2022)   Received from [employer], [facility]   HITS    Physically Hurt: Not on file    Insult or Talk Down To: Not on file    Threaten Physical Harm: Not on file    Scream or Curse: Not on file   Review of Systems  Constitutional:  Positive for malaise/fatigue. Negative for chills and fever.  HENT:  Negative for sore throat.   Respiratory:  Negative for cough and shortness of breath.   Cardiovascular:  Negative for chest pain, palpitations and leg swelling.  Gastrointestinal:  Positive for diarrhea and nausea. Negative for abdominal pain, blood in  stool, constipation and vomiting.  Genitourinary:  Negative for dysuria and hematuria.  Musculoskeletal:  Positive for back pain and myalgias.  Skin:  Negative for itching and rash.  Neurological:  Negative for dizziness and headaches.  Psychiatric/Behavioral:  Negative for depression and suicidal ideas.     Objective    BP 110/60   Pulse 80   Ht 5' 11 (1.803 m)   Wt 239 lb (108.4 kg)   LMP 01/04/2018   SpO2 94%   BMI 33.33 kg/m   Physical Exam Vitals reviewed.  Constitutional:      General: She is not in acute distress.    Appearance: Normal appearance. She is obese. She is not toxic-appearing.  HENT:     Head: Normocephalic and atraumatic.     Right Ear: External ear normal.     Left Ear: External ear normal.     Nose: Nose normal. No congestion or rhinorrhea.     Mouth/Throat:     Mouth: Mucous membranes are moist.     Pharynx: Oropharynx is clear. No oropharyngeal exudate or posterior oropharyngeal erythema.  Eyes:     General: No scleral icterus.  Extraocular Movements: Extraocular movements intact.     Conjunctiva/sclera: Conjunctivae normal.     Pupils: Pupils are equal, round, and reactive to light.  Cardiovascular:     Rate and Rhythm: Normal rate and regular rhythm.     Pulses: Normal pulses.     Heart sounds: Normal heart sounds. No murmur heard.    No friction rub. No gallop.  Pulmonary:     Effort: Pulmonary effort is normal.     Breath sounds: Normal breath sounds. No wheezing, rhonchi or rales.  Abdominal:     General: Abdomen is flat. Bowel sounds are normal. There is no distension.     Palpations: Abdomen is soft.     Tenderness: There is no abdominal tenderness.  Musculoskeletal:        General: No swelling. Normal range of motion.     Cervical back: Normal range of motion.     Right lower leg: No edema.     Left lower leg: No edema.  Lymphadenopathy:     Cervical: No cervical adenopathy.  Skin:    General: Skin is warm and dry.      Capillary Refill: Capillary refill takes less than 2 seconds.     Coloration: Skin is not jaundiced.  Neurological:     General: No focal deficit present.     Mental Status: She is alert and oriented to person, place, and time.  Psychiatric:        Mood and Affect: Mood normal.        Behavior: Behavior normal.   Last CBC Lab Results  Component Value Date   WBC 8.5 12/05/2023   HGB 13.8 12/05/2023   HCT 41.4 12/05/2023   MCV 93 12/05/2023   MCH 31.1 12/05/2023   RDW 14.5 12/05/2023   PLT 258 12/05/2023   Last metabolic panel Lab Results  Component Value Date   GLUCOSE 93 12/05/2023   NA 141 12/05/2023   K 4.9 12/05/2023   CL 103 12/05/2023   CO2 23 12/05/2023   BUN 17 12/05/2023   CREATININE 1.35 (H) 12/05/2023   EGFR 46 (L) 12/05/2023   CALCIUM  9.7 12/05/2023   PROT 6.3 12/05/2023   ALBUMIN 4.5 12/05/2023   LABGLOB 1.8 12/05/2023   AGRATIO 1.5 03/29/2016   BILITOT 0.3 12/05/2023   ALKPHOS 72 12/05/2023   AST 32 12/05/2023   ALT 36 (H) 12/05/2023   ANIONGAP 11 10/02/2023   Last lipids Lab Results  Component Value Date   CHOL 165 12/05/2023   HDL 49 12/05/2023   LDLCALC 88 12/05/2023   TRIG 165 (H) 12/05/2023   CHOLHDL 3.4 12/05/2023   Last hemoglobin A1c Lab Results  Component Value Date   HGBA1C 6.2 (A) 11/07/2023   Last thyroid  functions Lab Results  Component Value Date   TSH 1.030 10/27/2023   Last vitamin D  Lab Results  Component Value Date   VD25OH 73.7 10/27/2023   Last vitamin B12 and Folate Lab Results  Component Value Date   VITAMINB12 397 08/29/2019   FOLATE >20.0 08/29/2019   Assessment & Plan:   Problem List Items Addressed This Visit       Essential hypertension, benign   Previously documented history of essential hypertension.  BP is normal today off antihypertensive medication.      Migraine   She Dors is a history of migraine headaches.  Followed by neurology and is prescribed Nurtec, Emgality , and Zofran  for as needed  nausea relief.      Obstructive  sleep apnea (adult) (pediatric)   She endorses nightly compliance with CPAP      IBS   IBS-D.  She endorses chronic diarrhea and waves of nausea.  We discussed the importance of daily fiber supplementation.      Type 2 diabetes mellitus with diabetic chronic kidney disease (HCC)   Followed by endocrinology.  A1c 6.2 on labs from earlier this month.  She is currently prescribed Lantus  50 units nightly.  No medication changes are indicated today.  Consider addition of low-dose lisinopril at follow-up.      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  Not followed by nephrology currently.  Consider addition of low-dose lisinopril at follow-up.      Vaginal atrophy   Estradiol  cream refilled today      Mixed hyperlipidemia - Primary   She is currently prescribed rosuvastatin  40 mg daily.  Repeat lipid panel ordered today.      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HCC)   History of CVID.  Followed by hematology.  Currently prescribed Xembify  injections every other week.      Chronic pain syndrome   Secondary to chronic back pain, fibromyalgia, and multijoint osteoarthritis.  She is prescribed Lyrica  and tizanidine .      Chronic back pain   She endorses a history of chronic cervical, thoracic, and lumbar back pain.  He is currently managed with Lyrica  and tizanidine .  She has undergone multiple ablations and steroid injections previously.  She has been instructed to hold Tylenol  and NSAIDs.      Anxiety and depression   She endorses an extensive psychiatric history notable for BPD, PTSD, anxiety, and a history of suicide attempt by overdose.  Followed by psychiatry (Dr. Okey).  Currently prescribed Lamictal , Klonopin , Prozac , and Wellbutrin .      Return in about 3 months (around 03/04/2024).   Manus FORBES Fireman, MD

## 2023-12-06 LAB — CBC WITH DIFFERENTIAL/PLATELET
Basophils Absolute: 0.1 10*3/uL (ref 0.0–0.2)
Basos: 1 %
EOS (ABSOLUTE): 0.2 10*3/uL (ref 0.0–0.4)
Eos: 2 %
Hematocrit: 41.4 % (ref 34.0–46.6)
Hemoglobin: 13.8 g/dL (ref 11.1–15.9)
Immature Grans (Abs): 0 10*3/uL (ref 0.0–0.1)
Immature Granulocytes: 0 %
Lymphocytes Absolute: 2.2 10*3/uL (ref 0.7–3.1)
Lymphs: 26 %
MCH: 31.1 pg (ref 26.6–33.0)
MCHC: 33.3 g/dL (ref 31.5–35.7)
MCV: 93 fL (ref 79–97)
Monocytes Absolute: 0.5 10*3/uL (ref 0.1–0.9)
Monocytes: 6 %
Neutrophils Absolute: 5.6 10*3/uL (ref 1.4–7.0)
Neutrophils: 65 %
Platelets: 258 10*3/uL (ref 150–450)
RBC: 4.44 x10E6/uL (ref 3.77–5.28)
RDW: 14.5 % (ref 11.7–15.4)
WBC: 8.5 10*3/uL (ref 3.4–10.8)

## 2023-12-06 LAB — LIPID PANEL
Chol/HDL Ratio: 3.4 {ratio} (ref 0.0–4.4)
Cholesterol, Total: 165 mg/dL (ref 100–199)
HDL: 49 mg/dL (ref >39)
LDL Chol Calc (NIH): 88 mg/dL (ref 0–99)
Triglycerides: 165 mg/dL — ABNORMAL HIGH (ref 0–149)
VLDL Cholesterol Cal: 28 mg/dL (ref 5–40)

## 2023-12-06 LAB — COMPREHENSIVE METABOLIC PANEL
ALT: 36 [IU]/L — ABNORMAL HIGH (ref 0–32)
AST: 32 [IU]/L (ref 0–40)
Albumin: 4.5 g/dL (ref 3.8–4.9)
Alkaline Phosphatase: 72 [IU]/L (ref 44–121)
BUN/Creatinine Ratio: 13 (ref 9–23)
BUN: 17 mg/dL (ref 6–24)
Bilirubin Total: 0.3 mg/dL (ref 0.0–1.2)
CO2: 23 mmol/L (ref 20–29)
Calcium: 9.7 mg/dL (ref 8.7–10.2)
Chloride: 103 mmol/L (ref 96–106)
Creatinine, Ser: 1.35 mg/dL — ABNORMAL HIGH (ref 0.57–1.00)
Globulin, Total: 1.8 g/dL (ref 1.5–4.5)
Glucose: 93 mg/dL (ref 70–99)
Potassium: 4.9 mmol/L (ref 3.5–5.2)
Sodium: 141 mmol/L (ref 134–144)
Total Protein: 6.3 g/dL (ref 6.0–8.5)
eGFR: 46 mL/min/{1.73_m2} — ABNORMAL LOW (ref >59)

## 2023-12-06 LAB — IGG, IGA, IGM
IgA/Immunoglobulin A, Serum: 39 mg/dL — ABNORMAL LOW (ref 87–352)
IgG (Immunoglobin G), Serum: 691 mg/dL (ref 586–1602)
IgM (Immunoglobulin M), Srm: 12 mg/dL — ABNORMAL LOW (ref 26–217)

## 2023-12-07 ENCOUNTER — Encounter: Payer: Self-pay | Admitting: Internal Medicine

## 2023-12-08 ENCOUNTER — Other Ambulatory Visit: Payer: Self-pay

## 2023-12-09 ENCOUNTER — Other Ambulatory Visit (HOSPITAL_COMMUNITY): Payer: Self-pay

## 2023-12-11 ENCOUNTER — Other Ambulatory Visit (HOSPITAL_COMMUNITY): Payer: Self-pay | Admitting: Neurosurgery

## 2023-12-11 ENCOUNTER — Other Ambulatory Visit: Payer: Self-pay

## 2023-12-11 ENCOUNTER — Other Ambulatory Visit (HOSPITAL_COMMUNITY): Payer: Self-pay

## 2023-12-12 ENCOUNTER — Other Ambulatory Visit: Payer: Self-pay

## 2023-12-13 ENCOUNTER — Other Ambulatory Visit: Payer: Self-pay

## 2023-12-13 ENCOUNTER — Encounter: Payer: Self-pay | Admitting: Physician Assistant

## 2023-12-13 NOTE — Telephone Encounter (Signed)
 I just spoke to her and I had already given her the slip to have done.  She will have done at PCP office and results will be in [facility].

## 2023-12-15 ENCOUNTER — Ambulatory Visit: Payer: Self-pay | Admitting: Internal Medicine

## 2023-12-17 DIAGNOSIS — G8929 Other chronic pain: Secondary | ICD-10-CM | POA: Insufficient documentation

## 2023-12-17 DIAGNOSIS — G894 Chronic pain syndrome: Secondary | ICD-10-CM | POA: Insufficient documentation

## 2023-12-17 DIAGNOSIS — F32A Depression, unspecified: Secondary | ICD-10-CM | POA: Insufficient documentation

## 2023-12-17 DIAGNOSIS — N952 Postmenopausal atrophic vaginitis: Secondary | ICD-10-CM | POA: Insufficient documentation

## 2023-12-17 NOTE — Assessment & Plan Note (Signed)
 She endorses an extensive psychiatric history notable for BPD, PTSD, anxiety, and a history of suicide attempt by overdose.  Followed by psychiatry (Dr. Tenny Craw).  Currently prescribed Lamictal, Klonopin, Prozac, and Wellbutrin.

## 2023-12-17 NOTE — Assessment & Plan Note (Signed)
 History of CVID.  Followed by hematology.  Currently prescribed Xembify injections every other week.

## 2023-12-17 NOTE — Assessment & Plan Note (Signed)
Estradiol cream refilled today

## 2023-12-17 NOTE — Assessment & Plan Note (Signed)
 Followed by endocrinology.  A1c 6.2 on labs from earlier this month.  She is currently prescribed Lantus 50 units nightly.  No medication changes are indicated today.  Consider addition of low-dose lisinopril at follow-up.

## 2023-12-17 NOTE — Assessment & Plan Note (Signed)
 IBS-D.  She endorses chronic diarrhea and waves of nausea.  We discussed the importance of daily fiber supplementation.

## 2023-12-17 NOTE — Assessment & Plan Note (Signed)
 Secondary to chronic back pain, fibromyalgia, and multijoint osteoarthritis.  She is prescribed Lyrica and tizanidine.

## 2023-12-17 NOTE — Assessment & Plan Note (Signed)
 Previously documented history of essential hypertension.  BP is normal today off antihypertensive medication.

## 2023-12-17 NOTE — Assessment & Plan Note (Signed)
 She endorses nightly compliance with CPAP

## 2023-12-17 NOTE — Assessment & Plan Note (Signed)
 Michelle Clements is a history of migraine headaches.  Followed by neurology and is prescribed Nurtec, Emgality, and Zofran for as needed nausea relief.

## 2023-12-17 NOTE — Assessment & Plan Note (Signed)
 She is currently prescribed rosuvastatin 40 mg daily.  Repeat lipid panel ordered today.

## 2023-12-17 NOTE — Assessment & Plan Note (Signed)
 She endorses a history of chronic cervical, thoracic, and lumbar back pain.  He is currently managed with Lyrica and tizanidine.  She has undergone multiple ablations and steroid injections previously.  She has been instructed to hold Tylenol and NSAIDs.

## 2023-12-17 NOTE — Assessment & Plan Note (Signed)
 CKD stage III.  Not followed by nephrology currently.  Consider addition of low-dose lisinopril at follow-up.

## 2023-12-18 ENCOUNTER — Other Ambulatory Visit: Payer: Self-pay

## 2023-12-18 ENCOUNTER — Telehealth: Payer: Self-pay | Admitting: *Deleted

## 2023-12-18 NOTE — Telephone Encounter (Signed)
 I called and spoke with patient and advised per Pleasant Barefoot, PA that she should stay on her scheduled dose of Xempify and she can get labs on her next time around.  There is also no changes to her upcoming appt on 1/27.  Patient verbalizes understanding. No further questions.

## 2023-12-19 ENCOUNTER — Telehealth: Payer: PPO

## 2023-12-19 ENCOUNTER — Other Ambulatory Visit: Payer: Self-pay

## 2023-12-19 DIAGNOSIS — I10 Essential (primary) hypertension: Secondary | ICD-10-CM | POA: Diagnosis not present

## 2023-12-19 DIAGNOSIS — E119 Type 2 diabetes mellitus without complications: Secondary | ICD-10-CM | POA: Diagnosis not present

## 2023-12-19 DIAGNOSIS — Z794 Long term (current) use of insulin: Secondary | ICD-10-CM | POA: Diagnosis not present

## 2023-12-19 DIAGNOSIS — D801 Nonfamilial hypogammaglobulinemia: Secondary | ICD-10-CM | POA: Diagnosis not present

## 2023-12-19 DIAGNOSIS — S01332A Puncture wound without foreign body of left ear, initial encounter: Secondary | ICD-10-CM | POA: Diagnosis not present

## 2023-12-19 DIAGNOSIS — L089 Local infection of the skin and subcutaneous tissue, unspecified: Secondary | ICD-10-CM | POA: Diagnosis not present

## 2023-12-19 DIAGNOSIS — T162XXA Foreign body in left ear, initial encounter: Secondary | ICD-10-CM | POA: Diagnosis not present

## 2023-12-19 DIAGNOSIS — Z7984 Long term (current) use of oral hypoglycemic drugs: Secondary | ICD-10-CM | POA: Diagnosis not present

## 2023-12-19 DIAGNOSIS — Z87891 Personal history of nicotine dependence: Secondary | ICD-10-CM | POA: Diagnosis not present

## 2023-12-20 DIAGNOSIS — Z1152 Encounter for screening for COVID-19: Secondary | ICD-10-CM | POA: Diagnosis not present

## 2023-12-20 DIAGNOSIS — Z79899 Other long term (current) drug therapy: Secondary | ICD-10-CM | POA: Diagnosis not present

## 2023-12-20 DIAGNOSIS — K219 Gastro-esophageal reflux disease without esophagitis: Secondary | ICD-10-CM | POA: Diagnosis not present

## 2023-12-20 DIAGNOSIS — E1122 Type 2 diabetes mellitus with diabetic chronic kidney disease: Secondary | ICD-10-CM | POA: Diagnosis not present

## 2023-12-20 DIAGNOSIS — Z794 Long term (current) use of insulin: Secondary | ICD-10-CM | POA: Diagnosis not present

## 2023-12-20 DIAGNOSIS — N189 Chronic kidney disease, unspecified: Secondary | ICD-10-CM | POA: Diagnosis not present

## 2023-12-20 DIAGNOSIS — Z888 Allergy status to other drugs, medicaments and biological substances status: Secondary | ICD-10-CM | POA: Diagnosis not present

## 2023-12-20 DIAGNOSIS — B349 Viral infection, unspecified: Secondary | ICD-10-CM | POA: Diagnosis not present

## 2023-12-20 DIAGNOSIS — F32A Depression, unspecified: Secondary | ICD-10-CM | POA: Diagnosis not present

## 2023-12-20 DIAGNOSIS — Z7984 Long term (current) use of oral hypoglycemic drugs: Secondary | ICD-10-CM | POA: Diagnosis not present

## 2023-12-20 DIAGNOSIS — R110 Nausea: Secondary | ICD-10-CM | POA: Diagnosis not present

## 2023-12-20 DIAGNOSIS — Z882 Allergy status to sulfonamides status: Secondary | ICD-10-CM | POA: Diagnosis not present

## 2023-12-20 DIAGNOSIS — T50905A Adverse effect of unspecified drugs, medicaments and biological substances, initial encounter: Secondary | ICD-10-CM | POA: Diagnosis not present

## 2023-12-20 DIAGNOSIS — Z87891 Personal history of nicotine dependence: Secondary | ICD-10-CM | POA: Diagnosis not present

## 2023-12-20 DIAGNOSIS — R531 Weakness: Secondary | ICD-10-CM | POA: Diagnosis not present

## 2023-12-20 DIAGNOSIS — I129 Hypertensive chronic kidney disease with stage 1 through stage 4 chronic kidney disease, or unspecified chronic kidney disease: Secondary | ICD-10-CM | POA: Diagnosis not present

## 2023-12-20 DIAGNOSIS — E785 Hyperlipidemia, unspecified: Secondary | ICD-10-CM | POA: Diagnosis not present

## 2023-12-20 DIAGNOSIS — F411 Generalized anxiety disorder: Secondary | ICD-10-CM | POA: Diagnosis not present

## 2023-12-20 DIAGNOSIS — J45909 Unspecified asthma, uncomplicated: Secondary | ICD-10-CM | POA: Diagnosis not present

## 2023-12-20 DIAGNOSIS — Z91048 Other nonmedicinal substance allergy status: Secondary | ICD-10-CM | POA: Diagnosis not present

## 2023-12-22 ENCOUNTER — Other Ambulatory Visit (HOSPITAL_COMMUNITY): Payer: Self-pay

## 2023-12-22 ENCOUNTER — Other Ambulatory Visit: Payer: Self-pay

## 2023-12-22 ENCOUNTER — Encounter (HOSPITAL_COMMUNITY): Payer: Self-pay | Admitting: Hematology & Oncology

## 2023-12-22 DIAGNOSIS — F319 Bipolar disorder, unspecified: Secondary | ICD-10-CM | POA: Diagnosis not present

## 2023-12-25 ENCOUNTER — Other Ambulatory Visit (HOSPITAL_COMMUNITY): Payer: Self-pay

## 2023-12-25 MED ORDER — PREGABALIN 200 MG PO CAPS
200.0000 mg | ORAL_CAPSULE | Freq: Two times a day (BID) | ORAL | 2 refills | Status: DC
  Filled 2024-01-11: qty 60, 30d supply, fill #0

## 2023-12-26 ENCOUNTER — Other Ambulatory Visit (HOSPITAL_COMMUNITY): Payer: Self-pay

## 2023-12-28 ENCOUNTER — Other Ambulatory Visit: Payer: Self-pay

## 2023-12-30 NOTE — Progress Notes (Deleted)
[facility] [address], [place] [address]   CLINIC:  Medical Oncology/Hematology  PCP:  Billie Lade, MD [address] [place] [address] [phone]   REASON FOR VISIT:  Follow-up for hypogammaglobulinemia  PRIOR THERAPY: Monthly IVIG  CURRENT THERAPY: Subcutaneous IgG [name] every 2 weeks  INTERVAL HISTORY:   [name] 58 y.o. female returns for routine follow-up of hypogammaglobulinemia.  She was last seen by Rojelio Brenner PA-C on 10/02/2023.    At today's visit, she reports feeling somewhat poorly*** due to ongoing body aches, headaches, fatigue, and anxiety/depression.  ***  Xembify dose was changed to be given every 2 weeks as of November 2024. *** Symptoms after Xembify?  *** (Fatigue, body aches, headaches for 24 to 48 hours after each infusion, as well as diarrhea on the second day after the infusion).   ***She has some mild redness at the site of each injection, but this resolves within 1 to 2 days.  Overall, she feels that the symptoms are less severe than they were when she was taking IVIG, and feels that her quality of life has improved after switching to subcutaneous IgG.  ***She reports excellent hydration status.  ***She has ongoing joint pain that she attributes to her back and neck issues and fibromyalgia.*** She has not had the severe weakness that she was experiencing after her IVIG.   *** infections or antibiotics in the past 3 months. *** ***She denies any B symptoms.    She has 25***% energy and 40***% appetite. She has lost 10 pounds in the past 3 months, which she attributes to poor appetite.  ASSESSMENT & PLAN:  1.  Hypogammaglobulinemia with recurrent infections - Labs from October 2015 showed IgG 566 [phone]), IgM 28 (57-237), IgA 65, IgE 12 - Evaluated by [facility] by Dr. Ellie Lunch (10/27/2014), who noted: " A review of Ms. Ellingsen immune evaluation reveals mild hypogammaglobulinemia (not  significant), a low IgM but normal specific antibody titers, normal B cell subsets panels and normal cellular function. Taken together the immune evaluation is normal. I do not believe Ms. Head has a functional disorder of her immune system. For the hypo-IgM this may become significant if the IgM was less that 15. For this I suggest she have an annual IgG, IgM and IgA. Currently the IgM level is not likely to be significant". 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 Leonides Schanz, with first dose on 11/03/2021 -Treated for UTI last week.  Otherwise, no infections or antibiotics over the past 3 months.  No B symptoms. - She reported increasing weakness, fatigue, nausea, and myalgia/arthralgia after IVIG infusions (Gamunex-C 40 g/400 mL q 4 weeks). - Switched to subcutaneous IgG (Xembify 10g/15 mL SQ weekly) in June 2024, which she is tolerating better, although she does continue to have side effects of fatigue, headache, and diarrhea - Dose decreased to Xembify 10g/15 mL SQ every 2 weeks as of November 2024 to see if better tolerated.  *** - Most recent immunoglobulin troughs (12/05/2023): IgG remains therapeutic at 691.  IgA (39) IgM (12) remain low, more or less at baseline. - Labs from 12/05/2023 showed normal CBC and baseline CMP with creatinine 1.35/GFR 46.  Mild elevation in ALT at 36 (intermittently elevated since at least 2010) - Patient receives her home  IVIG infusions via [facility] (contact [place] Sandy at [phone], fax [phone], email [email or web address])*** - PLAN: Will check monthly quantitative immunoglobulin trough (day before Xembify) along with CBC/D and CMP.  *** - Therapeutic goal is IgG around 600. - OFFICE visit in 3 months - Educational information regarding common side effects and  risks has been provided to patient.  We discussed the importance of hydration due to risk of kidney injury.***  2.  Other history - Her past medical history is otherwise notable for fatty liver disease with history of transaminitis, GERD, fibromyalgia, type 2 diabetes mellitus, and IBS  PLAN SUMMARY: *** >> Continue Xembify every 2 weeks at home >> Monthly TROUGH labs to be obtained at [facility] = quantitative immunoglobulins, CBC/D, CMP >> OFFICE visit in 3 months     REVIEW OF SYSTEMS: ***  Review of Systems  Constitutional:  Positive for fatigue. Negative for appetite change, chills, diaphoresis, fever and unexpected weight change.  HENT:   Positive for trouble swallowing. Negative for lump/mass and nosebleeds.   Eyes:  Negative for eye problems.  Respiratory:  Positive for shortness of breath (With exertion). Negative for chest tightness, cough, hemoptysis and wheezing (asthma).   Cardiovascular:  Negative for chest pain, leg swelling and palpitations.  Gastrointestinal:  Positive for constipation (IBS), diarrhea (IBS) and nausea. Negative for abdominal pain, blood in stool and vomiting.  Genitourinary:  Negative for hematuria.   Musculoskeletal:  Positive for arthralgias, back pain, myalgias and neck pain.  Skin: Negative.   Neurological:  Positive for headaches (daily) and numbness. Negative for dizziness and light-headedness.  Hematological:  Does not bruise/bleed easily.  Psychiatric/Behavioral:  Positive for depression. Negative for sleep disturbance. The patient is nervous/anxious.      PHYSICAL EXAM:  ECOG PERFORMANCE STATUS: 1 - Symptomatic but completely ambulatory *** There were no vitals filed for this visit.  There were no vitals filed for this visit.  Physical Exam Constitutional:      Appearance: Normal appearance. She is obese.  Cardiovascular:     Heart sounds: Normal heart sounds.  Pulmonary:     Breath sounds: Normal breath sounds.  Neurological:      General: No focal deficit present.     Mental Status: Mental status is at baseline.  Psychiatric:        Behavior: Behavior normal. Behavior is cooperative.    PAST MEDICAL/SURGICAL HISTORY:  Past Medical History:  Diagnosis Date   Allergic rhinitis    Anemia    Arthritis    Lower back and hips   Asthma    Bipolar disorder (HCC)    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HCC)    Constipation    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HCC)    Depression    Essential hypertension, benign    Fibromyalgia    GERD (gastroesophageal reflux disease)    H/O sleep apnea    Hip pain, left    History of palpitations    Negative Holter monitor   History of pneumonia 1988   Hyperlipidemia    IBS (irritable bowel syndrome)    Neck pain    Obstructive sleep apnea (adult) (pediatric) 04/12/2021   Poor short term memory    PTSD (post-traumatic stress disorder)    S/P Botox injection 11/2014    For migraine headaches   Type 2 diabetes mellitus (HCC)    Urgency of urination    Past Surgical History:  Procedure Laterality Date   CARPAL TUNNEL RELEASE Right 06/10/2013   Procedure: CARPAL TUNNEL  RELEASE;  Surgeon: Reinaldo Meeker, MD;  Location: [facility];  Service: Neurosurgery;  Laterality: Right;  Right Carpal Tunnel Release    CHOLECYSTECTOMY     DENTAL SURGERY  12/2014   mole exc     x 3   MUSCLE BIOPSY  2008   Right leg   TUBAL LIGATION      SOCIAL HISTORY:  Social History   Socioeconomic History   Marital status: Married    Spouse name: Not on file   Number of children: 2   Years of education: College   Highest education level: Associate degree: occupational, Scientist, product/process development, or vocational program  Occupational History   Occupation: Chiropractor: UNEMPLOYED    Comment: Now disabled due to bipolar and fibromyalgia  Tobacco Use   Smoking status: Former    Current packs/day: 0.00    Types: Cigarettes    Quit date: 11/04/2010    Years since quitting: 13.1   Smokeless  tobacco: Never  Vaping Use   Vaping status: Never Used  Substance and Sexual Activity   Alcohol use: Yes    Comment: one mixed drink per month   Drug use: No   Sexual activity: Yes    Birth control/protection: Surgical  Other Topics Concern   Not on file  Social History Narrative   Married   Lives with spouse and daughter   Right handed.   Caffeine use: 2 cups coffee in morning and 2 cups tea during the day    Social Drivers of Health   Financial Resource Strain: Medium Risk (12/05/2023)   Overall Financial Resource Strain (CARDIA)    Difficulty of Paying Living Expenses: Somewhat hard  Food Insecurity: No Food Insecurity (12/05/2023)   Hunger Vital Sign    Worried About Running Out of Food in the Last Year: Never true    Ran Out of Food in the Last Year: Never true  Transportation Needs: No Transportation Needs (12/05/2023)   PRAPARE - Administrator, Civil Service (Medical): No    Lack of Transportation (Non-Medical): No  Physical Activity: Unknown (12/05/2023)   Exercise Vital Sign    Days of Exercise per Week: 0 days    Minutes of Exercise per Session: Not on file  Stress: Stress Concern Present (12/05/2023)   [employer] of Occupational Health - Occupational Stress Questionnaire    Feeling of Stress : To some extent  Social Connections: Unknown (12/05/2023)   Social Connection and Isolation Panel [NHANES]    Frequency of Communication with Friends and Family: Twice a week    Frequency of Social Gatherings with Friends and Family: Patient declined    Attends Religious Services: More than 4 times per year    Active Member of [employer] or Organizations: Yes    Attends Engineer, structural: More than 4 times per year    Marital Status: Married  Catering manager Violence: Unknown (03/10/2022)   Received from [employer], [facility]   HITS    Physically Hurt: Not on file    Insult or Talk Down To: Not on file    Threaten Physical Harm: Not on  file    Scream or Curse: Not on file    FAMILY HISTORY:  Family History  Problem Relation Age of Onset   Fibromyalgia Mother    Diabetes type II Mother    Depression Mother    Alzheimer's disease Mother    GER disease Mother    Heart disease Mother  Paranoid behavior Mother    Dementia Mother    Heart attack Father 63       MI x5   Stroke Father        CVA x7   Asthma Father    Brain cancer Father    Seizures Father    Anxiety disorder Sister    OCD Sister    Sexual abuse Sister    Physical abuse Sister    Anxiety disorder Sister    ADD / ADHD Daughter    ADD / ADHD Daughter    Alcohol abuse Neg Hx    Drug abuse Neg Hx    Schizophrenia Neg Hx     CURRENT MEDICATIONS:  Outpatient Encounter Medications as of 01/01/2024  Medication Sig Note   albuterol (VENTOLIN HFA) 108 (90 Base) MCG/ACT inhaler Inhale 2 puffs into the lungs every 6 (six) hours as needed for wheezing and/or shortness of breath.    aspirin EC 81 MG tablet Take 1 tablet every day by oral route.    azelastine (ASTELIN) 0.1 % nasal spray Place 1 spray into both nostrils 2 (two) times daily.    Blood Glucose Monitoring Suppl (ONETOUCH VERIO FLEX SYSTEM) w/Device KIT Use as directed to check blood glucose twice daily, before breakfast and before bed.    buPROPion (WELLBUTRIN XL) 150 MG 24 hr tablet Take 1 tablet (150 mg total) by mouth every morning.    cetirizine (ZYRTEC) 10 MG tablet Take 10 mg by mouth at bedtime.    Cholecalciferol (VITAMIN D) 50 MCG (2000 UT) tablet Take 2,000 Units by mouth daily.    clonazePAM (KLONOPIN) 0.5 MG tablet Take 1 tablet (0.5 mg total) every morning AND 2 tablets (1 mg total) at bedtime.    Continuous Glucose Sensor (FREESTYLE LIBRE 3 SENSOR) MISC Apply new sensor every 14 days to monitor blood glucose. Remove old sensor before applying new one.    cycloSPORINE (RESTASIS) 0.05 % ophthalmic emulsion Place 1 drop into both eyes 2 (two) times daily.    estradiol (ESTRACE) 0.1  MG/GM vaginal cream Place 1 Applicatorful vaginally 3 (three) times a week.    FLUoxetine (PROZAC) 40 MG capsule Take 1 capsule (40 mg total) by mouth daily.    fluticasone (FLONASE) 50 MCG/ACT nasal spray Place 1 spray into both nostrils daily.    Galcanezumab-gnlm (EMGALITY) 120 MG/ML SOSY Inject 1 mL into the skin every 30 (thirty) days.    Glucos-Chond-Hyal Ac-Ca Fructo (MOVE FREE JOINT HEALTH ADVANCE PO) Take by mouth.    hydrOXYzine (ATARAX) 10 MG tablet Take 1 tablet (10 mg total) by mouth 2 (two) times daily as needed.    Immune Globulin, Human,-klhw (XEMBIFY) 10 GM/50ML SOLN Inject 10 g into the skin every 7 (seven) days.    insulin glargine (LANTUS) 100 UNIT/ML Solostar Pen Inject 50 Units into the skin at bedtime.    ipratropium-albuterol (DUONEB) 0.5-2.5 (3) MG/3ML SOLN Take 3 mLs by nebulization 2 (two) times daily as needed.    lamoTRIgine (LAMICTAL) 25 MG tablet Take 1 tablet (25 mg total) by mouth 2 (two) times daily.    Lancets (ONETOUCH ULTRASOFT) lancets 1 each by Other route as needed.  04/25/2014: Received from: External Pharmacy   Latanoprostene Bunod (VYZULTA) 0.024 % SOLN Place 1 drop into both eyes at bedtime as directed    lidocaine (LIDODERM) 5 % lidocaine 5 % topical patch  APPLY 1 PATCH BY TOPICAL ROUTE ONCE DAILY (MAY WEAR UP TO 12HOURS.)    lidocaine (LIDODERM) 5 %  Place 1 patch onto the skin daily, may wear up to 12 hours    lubiprostone (AMITIZA) 24 MCG capsule Take 24 mcg by mouth 2 (two) times daily.    lubiprostone (AMITIZA) 24 MCG capsule Take 1 capsule (24 mcg total) by mouth 2 (two) times daily.    Melatonin 10 MG TABS Take 1 tablet every day by oral route at bedtime.    Multiple Vitamin (MULTIVITAMIN) tablet Take 1 tablet by mouth daily.    omeprazole (PRILOSEC) 20 MG capsule Take 20 mg by mouth daily.    omeprazole (PRILOSEC) 20 MG capsule Take 1 capsule (20 mg total) by mouth 2 (two) times daily.    ondansetron (ZOFRAN-ODT) 8 MG disintegrating tablet  Dissolve 1 tablet (8 mg total) on tongue every 8 (eight) hours as needed for nausea or vomiting.    ONETOUCH VERIO test strip 4 (four) times daily.    pregabalin (LYRICA) 200 MG capsule Take 1 capsule (200 mg total) by mouth 2 (two) times daily.    Rimegepant Sulfate (NURTEC) 75 MG TBDP Take 75mg  once daily as needed for headache    rosuvastatin (CRESTOR) 40 MG tablet Take 1 tablet (40 mg total) by mouth at bedtime.    sennosides-docusate sodium (SENOKOT-S) 8.6-50 MG tablet Take 1 tablet by mouth daily.    tiZANidine (ZANAFLEX) 4 MG tablet Take 4 mg by mouth daily. 4mg  at bedtime and 2 mg during the day 07/05/2023: Patient reports that she only takes 4 mg at night.   tiZANidine (ZANAFLEX) 4 MG tablet Take 1 tablet (4 mg total) by mouth at bedtime.    traZODone (DESYREL) 50 MG tablet Take 1 tablet (50 mg total) by mouth at bedtime.    zinc gluconate 50 MG tablet Take 50 mg by mouth daily.    [DISCONTINUED] rOPINIRole (REQUIP) 0.25 MG tablet Take 1 tablet (0.25 mg total) by mouth 2 (two) times daily.    [DISCONTINUED] SUMAtriptan (IMITREX) 100 MG tablet Take 1 tablet by mouth as needed for migraine. If migraine continues, may repeat dose after 2 hours.    No facility-administered encounter medications on file as of 01/01/2024.    ALLERGIES:  Allergies  Allergen Reactions   Molds & Smuts Shortness Of Breath    wheezing   Sulfa Antibiotics Anaphylaxis     : Angioedema    Trichophyton Shortness Of Breath    wheezing   Carbamazepine Rash   Dust Mite Extract Other (See Comments) and Cough    Wheezing   Gluten Meal Itching   Quetiapine Fumarate Other (See Comments)    LABORATORY DATA:  I have reviewed the labs as listed.  CBC    Component Value Date/Time   WBC 8.5 12/05/2023 1054   WBC 7.6 10/02/2023 0948   RBC 4.44 12/05/2023 1054   RBC 4.77 10/02/2023 0948   HGB 13.8 12/05/2023 1054   HCT 41.4 12/05/2023 1054   PLT 258 12/05/2023 1054   MCV 93 12/05/2023 1054   MCH 31.1  12/05/2023 1054   MCH 30.2 10/02/2023 0948   MCHC 33.3 12/05/2023 1054   MCHC 33.8 10/02/2023 0948   RDW 14.5 12/05/2023 1054   LYMPHSABS 2.2 12/05/2023 1054   MONOABS 0.6 10/02/2023 0948   EOSABS 0.2 12/05/2023 1054   BASOSABS 0.1 12/05/2023 1054      Latest Ref Rng & Units 12/05/2023   10:54 AM 10/27/2023   10:14 AM 10/02/2023    9:48 AM  CMP  Glucose 70 - 99 mg/dL 93  086  108   BUN 6 - 24 mg/dL 17  13  16    Creatinine 0.57 - 1.00 mg/dL 1.61  0.96  0.45   Sodium 134 - 144 mmol/L 141  141  139   Potassium 3.5 - 5.2 mmol/L 4.9  4.7  4.2   Chloride 96 - 106 mmol/L 103  102  102   CO2 20 - 29 mmol/L 23  25  26    Calcium 8.7 - 10.2 mg/dL 9.7  40.9  9.9   Total Protein 6.0 - 8.5 g/dL 6.3  7.0  7.2   Total Bilirubin 0.0 - 1.2 mg/dL 0.3  0.3  0.5   Alkaline Phos 44 - 121 IU/L 72  83  66   AST 0 - 40 IU/L 32  38  31   ALT 0 - 32 IU/L 36  44  42     DIAGNOSTIC IMAGING:  I have independently reviewed the relevant imaging and discussed with the patient.   WRAP UP:  All questions were answered. The patient knows to call the clinic with any problems, questions or concerns.  Medical decision making: Moderate***  Time spent on visit: I spent 20 minutes counseling the patient face to face. The total time spent in the appointment was 30 minutes and more than 50% was on counseling.  Carnella Guadalajara, PA-C  ***

## 2024-01-01 ENCOUNTER — Other Ambulatory Visit (HOSPITAL_COMMUNITY): Payer: Self-pay

## 2024-01-01 ENCOUNTER — Inpatient Hospital Stay: Payer: PPO | Attending: Physician Assistant | Admitting: Physician Assistant

## 2024-01-01 ENCOUNTER — Other Ambulatory Visit: Payer: Self-pay

## 2024-01-01 ENCOUNTER — Encounter (HOSPITAL_COMMUNITY): Payer: Self-pay | Admitting: Hematology & Oncology

## 2024-01-01 DIAGNOSIS — D801 Nonfamilial hypogammaglobulinemia: Secondary | ICD-10-CM | POA: Diagnosis not present

## 2024-01-02 ENCOUNTER — Other Ambulatory Visit (HOSPITAL_COMMUNITY): Payer: Self-pay

## 2024-01-02 ENCOUNTER — Encounter (HOSPITAL_COMMUNITY): Payer: Self-pay

## 2024-01-03 ENCOUNTER — Other Ambulatory Visit: Payer: Self-pay

## 2024-01-04 ENCOUNTER — Other Ambulatory Visit (HOSPITAL_COMMUNITY): Payer: Self-pay

## 2024-01-05 ENCOUNTER — Other Ambulatory Visit: Payer: Self-pay

## 2024-01-05 ENCOUNTER — Other Ambulatory Visit (HOSPITAL_BASED_OUTPATIENT_CLINIC_OR_DEPARTMENT_OTHER): Payer: Self-pay

## 2024-01-05 ENCOUNTER — Other Ambulatory Visit (HOSPITAL_COMMUNITY): Payer: Self-pay

## 2024-01-06 ENCOUNTER — Other Ambulatory Visit (HOSPITAL_COMMUNITY): Payer: Self-pay

## 2024-01-06 ENCOUNTER — Encounter (HOSPITAL_COMMUNITY): Payer: Self-pay | Admitting: Hematology & Oncology

## 2024-01-06 ENCOUNTER — Encounter (HOSPITAL_COMMUNITY): Payer: Self-pay

## 2024-01-08 ENCOUNTER — Ambulatory Visit (INDEPENDENT_AMBULATORY_CARE_PROVIDER_SITE_OTHER): Payer: PPO | Admitting: Gastroenterology

## 2024-01-08 ENCOUNTER — Encounter (INDEPENDENT_AMBULATORY_CARE_PROVIDER_SITE_OTHER): Payer: Self-pay | Admitting: Gastroenterology

## 2024-01-08 ENCOUNTER — Other Ambulatory Visit: Payer: Self-pay

## 2024-01-08 ENCOUNTER — Other Ambulatory Visit (HOSPITAL_COMMUNITY): Payer: Self-pay

## 2024-01-08 VITALS — BP 118/69 | HR 64 | Temp 97.9°F | Ht 71.0 in | Wt 241.0 lb

## 2024-01-08 DIAGNOSIS — R1310 Dysphagia, unspecified: Secondary | ICD-10-CM | POA: Insufficient documentation

## 2024-01-08 DIAGNOSIS — K5900 Constipation, unspecified: Secondary | ICD-10-CM | POA: Diagnosis not present

## 2024-01-08 DIAGNOSIS — D801 Nonfamilial hypogammaglobulinemia: Secondary | ICD-10-CM | POA: Diagnosis not present

## 2024-01-08 DIAGNOSIS — R110 Nausea: Secondary | ICD-10-CM

## 2024-01-08 DIAGNOSIS — R1312 Dysphagia, oropharyngeal phase: Secondary | ICD-10-CM | POA: Diagnosis not present

## 2024-01-08 DIAGNOSIS — R150 Incomplete defecation: Secondary | ICD-10-CM | POA: Insufficient documentation

## 2024-01-08 DIAGNOSIS — R109 Unspecified abdominal pain: Secondary | ICD-10-CM | POA: Diagnosis not present

## 2024-01-08 DIAGNOSIS — K581 Irritable bowel syndrome with constipation: Secondary | ICD-10-CM

## 2024-01-08 DIAGNOSIS — T17308A Unspecified foreign body in larynx causing other injury, initial encounter: Secondary | ICD-10-CM | POA: Insufficient documentation

## 2024-01-08 NOTE — Patient Instructions (Signed)
Continue Amitiza 24 mcg twice a day Schedule EGD  Schedule modified and swallow Referral for anorectal manometry

## 2024-01-08 NOTE — Progress Notes (Unsigned)
Michelle Clements, M.D. Gastroenterology & Hepatology [facility] Gastroenterology [address] [place], [place] [address] Primary Care Physician: Billie Lade, MD [address] [place] [address]  Referring MD: ***  Chief Complaint:  ***  History of Present Illness: Michelle Clements is a 58 y.o. female with past medical endoscopic ultrasound CVID, depression, bipolar disorder, asthma, depression, GERD, IBS-C, PTSD, diabetes, interstitial cystitis, CKD,, fibromyalgia, who presents for evaluation of ***  Most recent labs from 12/05/2023 showed CMP with creatinine 1.35, BUN 17, normal electrolytes, ALT 36, AST 32, rest within normal limits, CBC was completely normal.  Patient reports that she has taken Amitiza 24 mcg BID for the last 6 years.She used to have severe constipation. States that now she has fluctuation in her bowel movements as 50% of the time she has constipation and 50% has diarrhea. She reports that sometimes she has to do manual maneuvers to remove stool from anus. She states that sometimes she may have 4 watery Bms. Sometimes she may have multiple small Bms during the day, and has to go to the bathroom multiple times a day. Besides of Amitiza, she also takes stool softeners and "digestive enzymes".  She recently filled her Amitiza and would like to continue this medication for now.  The patient has presented midline and upper abdominal pain for multiple years.   She reports that she tried using Linzess in the past but led to constipation. Has not tried other laxatives.  Patient reports having issues with intermittent nausea for the last 4 months. She does not have the nausea every day and it fluctuates. She reports that sometimes sneezing aborts the nausea episodes. She does not vomit. She has been told by her neurologist that this could be a sign of a migraine.  The patient denies having any fever, chills, hematochezia, hematemesis,  abdominal distention, abdominal pain, diarrhea, jaundice, pruritus . Weight has fluctuated, but recently moved from 256 to 231 lb.  She takes Lyrica for her neuropathy.  She has noticed some intermittent dark stools but not black  She also reports that she has constant throat clearing and occasional episodes of choking when swallowing liquids.  Last UJW:JXBJY Last Colonoscopy:Possibly 10 years ago, no report available, patient reports it was normal. Had a negative Cologuard on 08/14/2023  FHx: neg for any gastrointestinal/liver disease, father brain cancer, aunt lung cancer, brother had some other type of cancer Social: quit smoking in 2011, neg frequent alcohol or illicit drug use Surgical: cholecystectomy, tubal ligation   Past Medical History: Past Medical History:  Diagnosis Date   Allergic rhinitis    Anemia    Arthritis    Lower back and hips   Asthma    Bipolar disorder (HCC)    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HCC)    Constipation    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HCC)    Depression    Essential hypertension, benign    Fibromyalgia    GERD (gastroesophageal reflux disease)    H/O sleep apnea    Hip pain, left    History of palpitations    Negative Holter monitor   History of pneumonia 1988   Hyperlipidemia    IBS (irritable bowel syndrome)    Neck pain    Obstructive sleep apnea (adult) (pediatric) 04/12/2021   Poor short term memory    PTSD (post-traumatic stress disorder)    S/P Botox injection 11/2014    For migraine headaches   Type 2 diabetes mellitus (  HCC)    Urgency of urination     Past Surgical History: Past Surgical History:  Procedure Laterality Date   CARPAL TUNNEL RELEASE Right 06/10/2013   Procedure: CARPAL TUNNEL RELEASE;  Surgeon: Reinaldo Meeker, MD;  Location: [facility];  Service: Neurosurgery;  Laterality: Right;  Right Carpal Tunnel Release    CHOLECYSTECTOMY     DENTAL SURGERY  12/2014   mole exc     x 3   MUSCLE BIOPSY  2008    Right leg   TUBAL LIGATION      Family History: Family History  Problem Relation Age of Onset   Fibromyalgia Mother    Diabetes type II Mother    Depression Mother    Alzheimer's disease Mother    GER disease Mother    Heart disease Mother    Paranoid behavior Mother    Dementia Mother    Heart attack Father 95       MI x5   Stroke Father        CVA x7   Asthma Father    Brain cancer Father    Seizures Father    Anxiety disorder Sister    OCD Sister    Sexual abuse Sister    Physical abuse Sister    Anxiety disorder Sister    ADD / ADHD Daughter    ADD / ADHD Daughter    Alcohol abuse Neg Hx    Drug abuse Neg Hx    Schizophrenia Neg Hx     Social History: Social History   Tobacco Use  Smoking Status Former   Current packs/day: 0.00   Types: Cigarettes   Quit date: 11/04/2010   Years since quitting: 13.1   Passive exposure: Past  Smokeless Tobacco Never   Social History   Substance and Sexual Activity  Alcohol Use Yes   Comment: one mixed drink per month   Social History   Substance and Sexual Activity  Drug Use No    Allergies: Allergies  Allergen Reactions   Molds & Smuts Shortness Of Breath    wheezing   Sulfa Antibiotics Anaphylaxis     : Angioedema    Trichophyton Shortness Of Breath    wheezing   Carbamazepine Rash   Dust Mite Extract Other (See Comments) and Cough    Wheezing   Gluten Meal Itching   Quetiapine Fumarate Other (See Comments)    Medications: Current Outpatient Medications  Medication Sig Dispense Refill   albuterol (VENTOLIN HFA) 108 (90 Base) MCG/ACT inhaler Inhale 2 puffs into the lungs every 6 (six) hours as needed for wheezing and/or shortness of breath. 6.7 g 5   aspirin EC 81 MG tablet Take 1 tablet every day by oral route.     azelastine (ASTELIN) 0.1 % nasal spray Place 1 spray into both nostrils 2 (two) times daily. 30 mL 2   Blood Glucose Monitoring Suppl (ONETOUCH VERIO FLEX SYSTEM) w/Device KIT Use as  directed to check blood glucose twice daily, before breakfast and before bed. 1 kit 0   buPROPion (WELLBUTRIN XL) 150 MG 24 hr tablet Take 1 tablet (150 mg total) by mouth every morning. 30 tablet 2   cetirizine (ZYRTEC) 10 MG tablet Take 10 mg by mouth at bedtime.     clonazePAM (KLONOPIN) 0.5 MG tablet Take 1 tablet (0.5 mg total) every morning AND 2 tablets (1 mg total) at bedtime. 90 tablet 2   Coenzyme Q10 (COQ10 PO) Take by mouth. One daily  Continuous Glucose Sensor (FREESTYLE LIBRE 3 SENSOR) MISC Apply new sensor every 14 days to monitor blood glucose. Remove old sensor before applying new one. 6 each 3   cycloSPORINE (RESTASIS) 0.05 % ophthalmic emulsion Place 1 drop into both eyes 2 (two) times daily. 180 each 99   estradiol (ESTRACE) 0.1 MG/GM vaginal cream Place 1 Applicatorful vaginally 3 (three) times a week. 42.5 g 1   FLUoxetine (PROZAC) 40 MG capsule Take 1 capsule (40 mg total) by mouth daily. 30 capsule 2   fluticasone (FLONASE) 50 MCG/ACT nasal spray Place 1 spray into both nostrils daily.     Galcanezumab-gnlm (EMGALITY) 120 MG/ML SOSY Inject 1 mL into the skin every 30 (thirty) days. 1 mL 11   Glucos-Chond-Hyal Ac-Ca Fructo (MOVE FREE JOINT HEALTH ADVANCE PO) Take by mouth.     hydrOXYzine (ATARAX) 10 MG tablet Take 1 tablet (10 mg total) by mouth 2 (two) times daily as needed. 60 tablet 2   Immune Globulin, Human,-klhw (XEMBIFY) 10 GM/50ML SOLN Inject 10 g into the skin every 7 (seven) days. 200 mL 3   insulin glargine (LANTUS) 100 UNIT/ML Solostar Pen Inject 50 Units into the skin at bedtime. 45 mL 1   ipratropium-albuterol (DUONEB) 0.5-2.5 (3) MG/3ML SOLN Take 3 mLs by nebulization 2 (two) times daily as needed. 90 mL 2   lamoTRIgine (LAMICTAL) 25 MG tablet Take 1 tablet (25 mg total) by mouth 2 (two) times daily. 60 tablet 2   Lancets (ONETOUCH ULTRASOFT) lancets 1 each by Other route as needed.      Latanoprostene Bunod (VYZULTA) 0.024 % SOLN Place 1 drop into both  eyes at bedtime as directed 7.5 mL 3   lidocaine (LIDODERM) 5 % Place 1 patch onto the skin daily, may wear up to 12 hours 30 patch 5   lubiprostone (AMITIZA) 24 MCG capsule Take 1 capsule (24 mcg total) by mouth 2 (two) times daily. 60 capsule 3   Melatonin 10 MG TABS Take 1 tablet every day by oral route at bedtime.     Multiple Vitamin (MULTIVITAMIN) tablet Take 1 tablet by mouth daily.     omeprazole (PRILOSEC) 20 MG capsule Take 1 capsule (20 mg total) by mouth 2 (two) times daily. 60 capsule 2   ondansetron (ZOFRAN-ODT) 8 MG disintegrating tablet Dissolve 1 tablet (8 mg total) on tongue every 8 (eight) hours as needed for nausea or vomiting. 20 tablet 6   ONETOUCH VERIO test strip 4 (four) times daily.     OVER THE COUNTER MEDICATION Magnessium 500 mg at night     OVER THE COUNTER MEDICATION Digestive enzymes one at night     pregabalin (LYRICA) 200 MG capsule Take 1 capsule (200 mg total) by mouth 2 (two) times daily. 60 capsule 2   Rimegepant Sulfate (NURTEC) 75 MG TBDP Take 75mg  once daily as needed for headache 8 tablet 11   rosuvastatin (CRESTOR) 40 MG tablet Take 1 tablet (40 mg total) by mouth at bedtime. 90 tablet 1   sennosides-docusate sodium (SENOKOT-S) 8.6-50 MG tablet Take 2 tablets by mouth at bedtime.     tiZANidine (ZANAFLEX) 4 MG tablet Take 4 mg by mouth daily. 4mg  at bedtime and 2 mg during the day     traZODone (DESYREL) 50 MG tablet Take 1 tablet (50 mg total) by mouth at bedtime. 30 tablet 2   VITAMIN D PO Take 5,000 Units by mouth daily.     zinc gluconate 50 MG tablet Take 50 mg by  mouth daily.     No current facility-administered medications for this visit.    Review of Systems: GENERAL: negative for malaise, night sweats HEENT: No changes in hearing or vision, no nose bleeds or other nasal problems. NECK: Negative for lumps, goiter, pain and significant neck swelling RESPIRATORY: Negative for cough, wheezing CARDIOVASCULAR: Negative for chest pain, leg  swelling, palpitations, orthopnea GI: SEE HPI MUSCULOSKELETAL: Negative for joint pain or swelling, back pain, and muscle pain. SKIN: Negative for lesions, rash PSYCH: Negative for sleep disturbance, mood disorder and recent psychosocial stressors. HEMATOLOGY Negative for prolonged bleeding, bruising easily, and swollen nodes. ENDOCRINE: Negative for cold or heat intolerance, polyuria, polydipsia and goiter. NEURO: negative for tremor, gait imbalance, syncope and seizures. The remainder of the review of systems is noncontributory.   Physical Exam: BP 118/69   Pulse 64   Temp 97.9 F (36.6 C) (Oral)   Ht 5\' 11"  (1.803 m)   Wt 241 lb (109.3 kg)   LMP 01/04/2018   BMI 33.61 kg/m  GENERAL: The patient is AO x3, in no acute distress. HEENT: Head is normocephalic and atraumatic. EOMI are intact. Mouth is well hydrated and without lesions. NECK: Supple. No masses LUNGS: Clear to auscultation. No presence of rhonchi/wheezing/rales. Adequate chest expansion HEART: RRR, normal s1 and s2. ABDOMEN: Tender upon palpation of the upper abdominal area, no guarding, no peritoneal signs, and nondistended. BS +. No masses. EXTREMITIES: Without any cyanosis, clubbing, rash, lesions or edema. NEUROLOGIC: AOx3, no focal motor deficit. SKIN: no jaundice, no rashes   Imaging/Labs: as above  I personally reviewed and interpreted the available labs, imaging and endoscopic files.  Impression and Plan: Michelle Clements is a 58 y.o. female with ***   All questions were answered.      Michelle Blazing, MD Gastroenterology and Hepatology [facility] Gastroenterology

## 2024-01-09 ENCOUNTER — Other Ambulatory Visit: Payer: Self-pay

## 2024-01-09 ENCOUNTER — Other Ambulatory Visit (HOSPITAL_COMMUNITY): Payer: Self-pay

## 2024-01-10 ENCOUNTER — Other Ambulatory Visit (HOSPITAL_COMMUNITY): Payer: Self-pay

## 2024-01-10 ENCOUNTER — Other Ambulatory Visit: Payer: Self-pay

## 2024-01-11 ENCOUNTER — Other Ambulatory Visit (HOSPITAL_COMMUNITY): Payer: Self-pay

## 2024-01-11 DIAGNOSIS — H18513 Endothelial corneal dystrophy, bilateral: Secondary | ICD-10-CM | POA: Diagnosis not present

## 2024-01-11 DIAGNOSIS — H04123 Dry eye syndrome of bilateral lacrimal glands: Secondary | ICD-10-CM | POA: Diagnosis not present

## 2024-01-11 DIAGNOSIS — E119 Type 2 diabetes mellitus without complications: Secondary | ICD-10-CM | POA: Diagnosis not present

## 2024-01-12 ENCOUNTER — Encounter (HOSPITAL_COMMUNITY): Payer: Self-pay | Admitting: Hematology & Oncology

## 2024-01-12 ENCOUNTER — Other Ambulatory Visit: Payer: Self-pay

## 2024-01-12 ENCOUNTER — Encounter: Payer: Self-pay | Admitting: Hematology

## 2024-01-12 ENCOUNTER — Other Ambulatory Visit (HOSPITAL_COMMUNITY): Payer: Self-pay

## 2024-01-12 MED ORDER — PREGABALIN 100 MG PO CAPS
200.0000 mg | ORAL_CAPSULE | Freq: Two times a day (BID) | ORAL | 0 refills | Status: DC
  Filled 2024-01-12: qty 120, 30d supply, fill #0

## 2024-01-15 ENCOUNTER — Other Ambulatory Visit (HOSPITAL_COMMUNITY): Payer: Self-pay | Admitting: Occupational Therapy

## 2024-01-15 DIAGNOSIS — M5412 Radiculopathy, cervical region: Secondary | ICD-10-CM | POA: Diagnosis not present

## 2024-01-15 DIAGNOSIS — T17308D Unspecified foreign body in larynx causing other injury, subsequent encounter: Secondary | ICD-10-CM

## 2024-01-15 DIAGNOSIS — R1312 Dysphagia, oropharyngeal phase: Secondary | ICD-10-CM

## 2024-01-15 DIAGNOSIS — D801 Nonfamilial hypogammaglobulinemia: Secondary | ICD-10-CM | POA: Diagnosis not present

## 2024-01-15 DIAGNOSIS — K219 Gastro-esophageal reflux disease without esophagitis: Secondary | ICD-10-CM

## 2024-01-16 ENCOUNTER — Other Ambulatory Visit: Payer: Self-pay

## 2024-01-17 ENCOUNTER — Other Ambulatory Visit: Payer: Self-pay

## 2024-01-18 ENCOUNTER — Other Ambulatory Visit: Payer: Self-pay

## 2024-01-18 DIAGNOSIS — G4733 Obstructive sleep apnea (adult) (pediatric): Secondary | ICD-10-CM

## 2024-01-18 MED ORDER — UNABLE TO FIND: 1.0000 | Freq: Every day | 11 refills | Status: AC

## 2024-01-18 NOTE — Telephone Encounter (Signed)
Faxed to [facility]

## 2024-01-19 ENCOUNTER — Encounter: Payer: Self-pay | Admitting: Hematology

## 2024-01-19 DIAGNOSIS — F319 Bipolar disorder, unspecified: Secondary | ICD-10-CM | POA: Diagnosis not present

## 2024-01-19 NOTE — Progress Notes (Signed)
[facility] [address], [place] [address]   CLINIC:  Medical Oncology/Hematology  PCP:  Michelle Lade, MD [address] [place] [address] [phone]   REASON FOR VISIT:  Follow-up for hypogammaglobulinemia  PRIOR THERAPY: Monthly IVIG  CURRENT THERAPY: Subcutaneous IgG Michelle Clements) every 2 weeks  INTERVAL HISTORY:   Michelle Clements 58 y.o. female returns for routine follow-up of hypogammaglobulinemia.  She was last seen by Michelle Brenner PA-C on 10/02/2023.    At today's visit, she reports feeling somewhat poorly due to ongoing body aches, headaches, fatigue, and anxiety/depression.  She continues to tolerate subcutaneous Xembify (every 2 weeks) better than she was previously tolerating IVIG.  She does have ongoing fatigue, headaches, and bodyaches related to fibromyalgia and degenerative disc disease, but she no longer feels that these symptoms are directly related to the timing of her immunoglobulin therapy.  (Previously reported fatigue, body aches, headaches, and diarrhea lasting 24 to 48 hours after each infusion.) she denies any major injection site reactions.  She has not had the severe weakness that she was experiencing after her IVIG.   She reports one episode of infection within the past 3 months due to infected ear piercing, requiring 10 days of antibiotics.  She currently reports viral head cold.  She denies any unexplained fevers, chills, night sweats, or weight loss.  She has 20% energy and 90% appetite. She has lost 10 pounds in the past 3 months, which she attributes to poor appetite.  ASSESSMENT & PLAN:  1.  Hypogammaglobulinemia with recurrent infections - Labs from October 2015 showed IgG 566 [phone]), IgM 28 (57-237), IgA 65, IgE 12 - Evaluated by [facility] by Dr. Ellie Clements (10/27/2014), who noted: " A review of Ms. Humm immune evaluation reveals mild hypogammaglobulinemia (not significant), a low IgM but normal  specific antibody titers, normal B cell subsets panels and normal cellular function. Taken together the immune evaluation is normal. I do not believe Ms. Roper has a functional disorder of her immune system. For the hypo-IgM this may become significant if the IgM was less that 15. For this I suggest she have an annual IgG, IgM and IgA. Currently the IgM level is not likely to be significant". 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 Leonides Clements, with first dose on 11/03/2021 -Treated for UTI last week.  Otherwise, no infections or antibiotics over the past 3 months.  No B symptoms. - She reported increasing weakness, fatigue, nausea, and myalgia/arthralgia after IVIG infusions (Gamunex-C 40 g/400 mL q 4 weeks). - Switched to subcutaneous IgG (Xembify 10g/15 mL SQ weekly) in June 2024, which she is tolerating better, although she does continue to have side effects of fatigue, headache, and diarrhea - Dose decreased to Xembify 10g/15 mL SQ every 2 weeks as of November 2024 to see if better tolerated.   - Most recent immunoglobulin troughs (01/08/2024): IgG remains therapeutic at 620.  IgA (40) IgM (13) remain low, more or less at baseline. - Labs from 12/05/2023 showed normal CBC and baseline CMP with creatinine 1.35/GFR 46.  Mild elevation in ALT at 36 (intermittently elevated since at least 2010) - Patient receives her home IVIG infusions via [facility] (contact [place] Sandy at [phone], fax [phone], email [email or web address]) - PLAN: Will check monthly quantitative immunoglobulin trough (  day before [facility]) along with CBC/D and CMP.   - Therapeutic goal is IgG around 600. - OFFICE visit in 3 months - Educational information regarding common side effects and risks has been provided to patient.  We  discussed the importance of hydration due to risk of kidney injury.  2.  Other history - Her past medical history is otherwise notable for fatty liver disease with history of transaminitis, GERD, fibromyalgia, type 2 diabetes mellitus, and IBS  PLAN SUMMARY:  >> Continue Xembify every 2 weeks at home >> Monthly TROUGH labs to be obtained at [facility] = quantitative immunoglobulins, CBC/D, CMP >> OFFICE visit in 3 months     REVIEW OF SYSTEMS:   Review of Systems  Constitutional:  Positive for fatigue. Negative for appetite change, chills, diaphoresis, fever and unexpected weight change.  HENT:   Negative for lump/mass, nosebleeds and trouble swallowing.   Eyes:  Negative for eye problems.  Respiratory:  Positive for cough. Negative for chest tightness, hemoptysis, shortness of breath and wheezing (asthma).   Cardiovascular:  Negative for chest pain, leg swelling and palpitations.  Gastrointestinal:  Positive for constipation (IBS), diarrhea (IBS) and nausea. Negative for abdominal pain, blood in stool and vomiting.  Genitourinary:  Negative for hematuria.   Musculoskeletal:  Positive for arthralgias, back pain, myalgias and neck pain.  Skin: Negative.   Neurological:  Positive for headaches (daily) and numbness. Negative for dizziness and light-headedness.  Hematological:  Does not bruise/bleed easily.  Psychiatric/Behavioral:  Positive for depression. Negative for sleep disturbance. The patient is nervous/anxious.      PHYSICAL EXAM:  ECOG PERFORMANCE STATUS: 1 - Symptomatic but completely ambulatory  Vitals:   01/22/24 1000  BP: 134/74  Pulse: 83  Resp: 18  Temp: 98.4 F (36.9 C)  SpO2: 97%    Filed Weights   01/22/24 1000  Weight: 236 lb 12.4 oz (107.4 kg)    Physical Exam Constitutional:      Appearance: Normal appearance. She is obese.  Cardiovascular:     Heart sounds: Normal heart sounds.  Pulmonary:     Breath sounds: Normal breath sounds.  Neurological:      General: No focal deficit present.     Mental Status: Mental status is at baseline.  Psychiatric:        Behavior: Behavior normal. Behavior is cooperative.    PAST MEDICAL/SURGICAL HISTORY:  Past Medical History:  Diagnosis Date   Allergic rhinitis    Anemia    Arthritis    Lower back and hips   Asthma    Bipolar disorder (HCC)    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HCC)    Constipation    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HCC)    Depression    Essential hypertension, benign    Fibromyalgia    GERD (gastroesophageal reflux disease)    H/O sleep apnea    Hip pain, left    History of palpitations    Negative Holter monitor   History of pneumonia 1988   Hyperlipidemia    IBS (irritable bowel syndrome)    Neck pain    Obstructive sleep apnea (adult) (pediatric) 04/12/2021   Poor short term memory    PTSD (post-traumatic stress disorder)    S/P Botox injection 11/2014    For migraine headaches   Type 2 diabetes mellitus (HCC)    Urgency of urination    Past Surgical History:  Procedure Laterality Date   CARPAL TUNNEL RELEASE Right 06/10/2013   Procedure: CARPAL TUNNEL RELEASE;  Surgeon: Reinaldo Meeker, MD;  Location: [facility];  Service: Neurosurgery;  Laterality: Right;  Right Carpal Tunnel Release    CHOLECYSTECTOMY     DENTAL SURGERY  12/2014   mole exc     x 3   MUSCLE BIOPSY  2008   Right leg   TUBAL LIGATION      SOCIAL HISTORY:  Social History   Socioeconomic History   Marital status: Married    Spouse name: Not on file   Number of children: 2   Years of education: College   Highest education level: Associate degree: occupational, Scientist, product/process development, or vocational program  Occupational History   Occupation: Chiropractor: UNEMPLOYED    Comment: Now disabled due to bipolar and fibromyalgia  Tobacco Use   Smoking status: Former    Current packs/day: 0.00    Types: Cigarettes    Quit date: 11/04/2010    Years since quitting: 13.2    Passive  exposure: Past   Smokeless tobacco: Never  Vaping Use   Vaping status: Never Used  Substance and Sexual Activity   Alcohol use: Yes    Comment: one mixed drink per month   Drug use: No   Sexual activity: Yes    Birth control/protection: Surgical  Other Topics Concern   Not on file  Social History Narrative   Married   Lives with spouse and daughter   Right handed.   Caffeine use: 2 cups coffee in morning and 2 cups tea during the day    Social Drivers of Health   Financial Resource Strain: Medium Risk (12/05/2023)   Overall Financial Resource Strain (CARDIA)    Difficulty of Paying Living Expenses: Somewhat hard  Food Insecurity: No Food Insecurity (12/05/2023)   Hunger Vital Sign    Worried About Running Out of Food in the Last Year: Never true    Ran Out of Food in the Last Year: Never true  Transportation Needs: No Transportation Needs (12/05/2023)   PRAPARE - Administrator, Civil Service (Medical): No    Lack of Transportation (Non-Medical): No  Physical Activity: Unknown (12/05/2023)   Exercise Vital Sign    Days of Exercise per Week: 0 days    Minutes of Exercise per Session: Not on file  Stress: Stress Concern Present (12/05/2023)   [employer] of Occupational Health - Occupational Stress Questionnaire    Feeling of Stress : To some extent  Social Connections: Unknown (12/05/2023)   Social Connection and Isolation Panel [NHANES]    Frequency of Communication with Friends and Family: Twice a week    Frequency of Social Gatherings with Friends and Family: Patient declined    Attends Religious Services: More than 4 times per year    Active Member of [employer] or Organizations: Yes    Attends Engineer, structural: More than 4 times per year    Marital Status: Married  Catering manager Violence: Unknown (03/10/2022)   Received from [employer], [facility]   HITS    Physically Hurt: Not on file    Insult or Talk Down To: Not on file     Threaten Physical Harm: Not on file    Scream or Curse: Not on file    FAMILY HISTORY:  Family History  Problem Relation Age of Onset   Fibromyalgia Mother    Diabetes type II Mother    Depression Mother    Alzheimer's disease Mother    GER disease Mother  Heart disease Mother    Paranoid behavior Mother    Dementia Mother    Heart attack Father 20       MI x5   Stroke Father        CVA x7   Asthma Father    Brain cancer Father    Seizures Father    Anxiety disorder Sister    OCD Sister    Sexual abuse Sister    Physical abuse Sister    Anxiety disorder Sister    ADD / ADHD Daughter    ADD / ADHD Daughter    Alcohol abuse Neg Hx    Drug abuse Neg Hx    Schizophrenia Neg Hx     CURRENT MEDICATIONS:  Outpatient Encounter Medications as of 01/22/2024  Medication Sig Note   albuterol (VENTOLIN HFA) 108 (90 Base) MCG/ACT inhaler Inhale 2 puffs into the lungs every 6 (six) hours as needed for wheezing and/or shortness of breath.    aspirin EC 81 MG tablet Take 1 tablet every day by oral route.    azelastine (ASTELIN) 0.1 % nasal spray Place 1 spray into both nostrils 2 (two) times daily.    Blood Glucose Monitoring Suppl (ONETOUCH VERIO FLEX SYSTEM) w/Device KIT Use as directed to check blood glucose twice daily, before breakfast and before bed.    buPROPion (WELLBUTRIN XL) 150 MG 24 hr tablet Take 1 tablet (150 mg total) by mouth every morning.    cetirizine (ZYRTEC) 10 MG tablet Take 10 mg by mouth at bedtime.    clonazePAM (KLONOPIN) 0.5 MG tablet Take 1 tablet (0.5 mg total) every morning AND 2 tablets (1 mg total) at bedtime.    Coenzyme Q10 (COQ10 PO) Take by mouth. One daily    Continuous Glucose Sensor (FREESTYLE LIBRE 3 SENSOR) MISC Apply new sensor every 14 days to monitor blood glucose. Remove old sensor before applying new one.    cycloSPORINE (RESTASIS) 0.05 % ophthalmic emulsion Place 1 drop into both eyes 2 (two) times daily.    estradiol (ESTRACE) 0.1 MG/GM  vaginal cream Place 1 Applicatorful vaginally 3 (three) times a week.    ferrous sulfate 325 (65 FE) MG EC tablet Take 325 mg by mouth 3 (three) times daily with meals. Slow FE    FLUoxetine (PROZAC) 40 MG capsule Take 1 capsule (40 mg total) by mouth daily.    fluticasone (FLONASE) 50 MCG/ACT nasal spray Place 1 spray into both nostrils daily.    Galcanezumab-gnlm (EMGALITY) 120 MG/ML SOSY Inject 1 mL into the skin every 30 (thirty) days.    Glucos-Chond-Hyal Ac-Ca Fructo (MOVE FREE JOINT HEALTH ADVANCE PO) Take by mouth.    hydrOXYzine (ATARAX) 10 MG tablet Take 1 tablet (10 mg total) by mouth 2 (two) times daily as needed.    Immune Globulin, Human,-klhw (XEMBIFY) 10 GM/50ML SOLN Inject 10 g into the skin every 7 (seven) days.    insulin glargine (LANTUS) 100 UNIT/ML Solostar Pen Inject 50 Units into the skin at bedtime.    ipratropium-albuterol (DUONEB) 0.5-2.5 (3) MG/3ML SOLN Take 3 mLs by nebulization 2 (two) times daily as needed.    lamoTRIgine (LAMICTAL) 25 MG tablet Take 1 tablet (25 mg total) by mouth 2 (two) times daily.    Lancets (ONETOUCH ULTRASOFT) lancets 1 each by Other route as needed.  04/25/2014: Received from: External Pharmacy   Latanoprostene Bunod (VYZULTA) 0.024 % SOLN Place 1 drop into both eyes at bedtime as directed    lidocaine (LIDODERM) 5 %  Place 1 patch onto the skin daily, may wear up to 12 hours 01/08/2024: prn   lubiprostone (AMITIZA) 24 MCG capsule Take 1 capsule (24 mcg total) by mouth 2 (two) times daily.    Melatonin 10 MG TABS Take 1 tablet every day by oral route at bedtime.    Multiple Vitamin (MULTIVITAMIN) tablet Take 1 tablet by mouth daily.    omeprazole (PRILOSEC) 20 MG capsule Take 1 capsule (20 mg total) by mouth 2 (two) times daily.    ondansetron (ZOFRAN-ODT) 8 MG disintegrating tablet Dissolve 1 tablet (8 mg total) on tongue every 8 (eight) hours as needed for nausea or vomiting.    ONETOUCH VERIO test strip 4 (four) times daily.    OVER THE  COUNTER MEDICATION Magnessium 500 mg at night    OVER THE COUNTER MEDICATION Digestive enzymes one at night    pregabalin (LYRICA) 100 MG capsule Take 2 capsules (200 mg total) by mouth 2 (two) times daily.    pregabalin (LYRICA) 200 MG capsule Take 1 capsule (200 mg total) by mouth 2 (two) times daily.    Rimegepant Sulfate (NURTEC) 75 MG TBDP Take 75mg  once daily as needed for headache    rosuvastatin (CRESTOR) 40 MG tablet Take 1 tablet (40 mg total) by mouth at bedtime.    sennosides-docusate sodium (SENOKOT-S) 8.6-50 MG tablet Take 2 tablets by mouth at bedtime.    tiZANidine (ZANAFLEX) 4 MG tablet Take 4 mg by mouth daily. 4mg  at bedtime and 2 mg during the day 01/08/2024: 4mg  every night and additional 2mg  only if needed    traZODone (DESYREL) 50 MG tablet Take 1 tablet (50 mg total) by mouth at bedtime.    UNABLE TO FIND 1 each by Does not apply route daily. Med Name: CPAP SUPPLIES Full face mask Cushion for mask -Medium ResMed    VITAMIN D PO Take 5,000 Units by mouth daily.    zinc gluconate 50 MG tablet Take 50 mg by mouth daily.    [DISCONTINUED] rOPINIRole (REQUIP) 0.25 MG tablet Take 1 tablet (0.25 mg total) by mouth 2 (two) times daily.    [DISCONTINUED] SUMAtriptan (IMITREX) 100 MG tablet Take 1 tablet by mouth as needed for migraine. If migraine continues, may repeat dose after 2 hours.    No facility-administered encounter medications on file as of 01/22/2024.    ALLERGIES:  Allergies  Allergen Reactions   Dust Mite Extract Cough, Other (See Comments) and Shortness Of Breath    Wheezing  Other Reaction(s): Cough, Other (See Comments)  Other reaction(s): Wheezing    Wheezing    Wheezing Other reaction(s): Wheezing Wheezing Wheezing Other reaction(s): Wheezing Wheezing Wheezing Other reaction(s): Wheezing Wheezing Wheezing Other reaction(s): Wheezing Wheezing Wheezing Other reaction(s): Wheezing Wheezing Wheezing Other reaction(s): Wheezing Wheezing Wheezing Other  reaction(s): Wheezing Wheezing    Wheezing, Other reaction(s): Wheezing, Wheezing, Wheezing, Other reaction(s): Wheezing, Wheezing, Wheezing, Other reaction(s): Wheezing, Wheezing, Wheezing, Other reaction(s): Wheezing, Wheezing, Wheezing, Other reaction(s): Wheezing, Wheezing, Wheezing, Other reaction(s): Wheezing, Wheezing, Wheezing, Other reaction(s): Wheezing, Wheezing   Molds & Smuts Shortness Of Breath    wheezing  wheezing wheezing wheezing    wheezing, wheezing, wheezing   Sulfa Antibiotics Anaphylaxis     : Angioedema    Trichophyton Shortness Of Breath    wheezing  wheezing    wheezing wheezing wheezing wheezing wheezing wheezing wheezing wheezing wheezing wheezing wheezing wheezing wheezing wheezing wheezing wheezing wheezing    wheezing, wheezing, wheezing, wheezing, wheezing, wheezing, wheezing, wheezing, wheezing, wheezing, wheezing, wheezing,  wheezing, wheezing, wheezing, wheezing, wheezing   Carbamazepine Rash    Other Reaction(s): Other (See Comments)   Gluten Meal Itching   Quetiapine Fumarate Other (See Comments)    LABORATORY DATA:  I have reviewed the labs as listed.  CBC    Component Value Date/Time   WBC 8.5 12/05/2023 1054   WBC 7.6 10/02/2023 0948   RBC 4.44 12/05/2023 1054   RBC 4.77 10/02/2023 0948   HGB 13.8 12/05/2023 1054   HCT 41.4 12/05/2023 1054   PLT 258 12/05/2023 1054   MCV 93 12/05/2023 1054   MCH 31.1 12/05/2023 1054   MCH 30.2 10/02/2023 0948   MCHC 33.3 12/05/2023 1054   MCHC 33.8 10/02/2023 0948   RDW 14.5 12/05/2023 1054   LYMPHSABS 2.2 12/05/2023 1054   MONOABS 0.6 10/02/2023 0948   EOSABS 0.2 12/05/2023 1054   BASOSABS 0.1 12/05/2023 1054      Latest Ref Rng & Units 12/05/2023   10:54 AM 10/27/2023   10:14 AM 10/02/2023    9:48 AM  CMP  Glucose 70 - 99 mg/dL 93  295  621   BUN 6 - 24 mg/dL 17  13  16    Creatinine 0.57 - 1.00 mg/dL 3.08  6.57  8.46   Sodium 134 - 144 mmol/L 141  141  139   Potassium 3.5 - 5.2 mmol/L  4.9  4.7  4.2   Chloride 96 - 106 mmol/L 103  102  102   CO2 20 - 29 mmol/L 23  25  26    Calcium 8.7 - 10.2 mg/dL 9.7  96.2  9.9   Total Protein 6.0 - 8.5 g/dL 6.3  7.0  7.2   Total Bilirubin 0.0 - 1.2 mg/dL 0.3  0.3  0.5   Alkaline Phos 44 - 121 IU/L 72  83  66   AST 0 - 40 IU/L 32  38  31   ALT 0 - 32 IU/L 36  44  42     DIAGNOSTIC IMAGING:  I have independently reviewed the relevant imaging and discussed with the patient.   WRAP UP:  All questions were answered. The patient knows to call the clinic with any problems, questions or concerns.  Medical decision making: Moderate  Time spent on visit: I spent 20 minutes counseling the patient face to face. The total time spent in the appointment was 30 minutes and more than 50% was on counseling.  Carnella Guadalajara, PA-C  01/22/24 4:46 PM

## 2024-01-22 ENCOUNTER — Inpatient Hospital Stay: Payer: PPO | Attending: Physician Assistant | Admitting: Physician Assistant

## 2024-01-22 VITALS — BP 134/74 | HR 83 | Temp 98.4°F | Resp 18 | Wt 236.8 lb

## 2024-01-22 DIAGNOSIS — D839 Common variable immunodeficiency, unspecified: Secondary | ICD-10-CM

## 2024-01-22 DIAGNOSIS — D801 Nonfamilial hypogammaglobulinemia: Secondary | ICD-10-CM | POA: Insufficient documentation

## 2024-01-22 DIAGNOSIS — Z87891 Personal history of nicotine dependence: Secondary | ICD-10-CM | POA: Insufficient documentation

## 2024-01-24 ENCOUNTER — Ambulatory Visit: Payer: PPO | Admitting: Internal Medicine

## 2024-01-24 ENCOUNTER — Other Ambulatory Visit: Payer: Self-pay

## 2024-01-24 ENCOUNTER — Other Ambulatory Visit (HOSPITAL_COMMUNITY): Payer: Self-pay

## 2024-01-24 NOTE — Progress Notes (Unsigned)
   Established Patient Office Visit   Subjective  Patient ID: KYNSLEE BAHAM, female    DOB: 1966/11/02  Age: 58 y.o. MRN: [id number]  No chief complaint on file.   She  has a past medical history of Allergic rhinitis, Anemia, Arthritis, Asthma, Bipolar disorder (HCC), 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HCC), Constipation, 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HCC), Depression, Essential hypertension, benign, Fibromyalgia, GERD (gastroesophageal reflux disease), H/O sleep apnea, Hip pain, left, History of palpitations, History of pneumonia (1988), Hyperlipidemia, IBS (irritable bowel syndrome), Neck pain, Obstructive sleep apnea (adult) (pediatric) (04/12/2021), Poor short term memory, PTSD (post-traumatic stress disorder), S/P Botox injection (11/2014 ), Type 2 diabetes mellitus (HCC), and Urgency of urination.  HPI  ROS    Objective:     LMP 01/04/2018  {Vitals History (Optional):23777}  Physical Exam   No results found for any visits on 01/26/24.  The 10-year ASCVD risk score (Arnett DK, et al., 2019) is: 3.8%    Assessment & Plan:  There are no diagnoses linked to this encounter.  No follow-ups on file.   Cruzita Lederer Newman Nip, FNP

## 2024-01-24 NOTE — Patient Instructions (Signed)

## 2024-01-25 ENCOUNTER — Other Ambulatory Visit (HOSPITAL_COMMUNITY): Payer: Self-pay

## 2024-01-25 ENCOUNTER — Other Ambulatory Visit: Payer: Self-pay

## 2024-01-25 ENCOUNTER — Encounter: Payer: Self-pay | Admitting: Internal Medicine

## 2024-01-25 ENCOUNTER — Telehealth (INDEPENDENT_AMBULATORY_CARE_PROVIDER_SITE_OTHER): Payer: PPO | Admitting: Internal Medicine

## 2024-01-25 ENCOUNTER — Ambulatory Visit: Payer: Self-pay | Admitting: Internal Medicine

## 2024-01-25 DIAGNOSIS — J45901 Unspecified asthma with (acute) exacerbation: Secondary | ICD-10-CM

## 2024-01-25 MED ORDER — PREDNISONE 20 MG PO TABS: 40.0000 mg | ORAL_TABLET | Freq: Every day | ORAL | 0 refills | Status: AC

## 2024-01-25 MED ORDER — BENZONATATE 100 MG PO CAPS: 100.0000 mg | ORAL_CAPSULE | Freq: Two times a day (BID) | ORAL | 0 refills | Status: DC | PRN

## 2024-01-25 MED ORDER — AZITHROMYCIN 250 MG PO TABS: ORAL_TABLET | ORAL | 0 refills | Status: DC

## 2024-01-25 NOTE — Progress Notes (Signed)
Virtual Visit via Video Note  I connected with Michelle Clements on 01/25/24 at  4:00 PM EST by a video enabled telemedicine application and verified that I am speaking with the correct person using two identifiers.  Patient Location: Home Provider Location: Office/Clinic  I discussed the limitations, risks, security, and privacy concerns of performing an evaluation and management service by video and the availability of in person appointments. I also discussed with the patient that there may be a patient responsible charge related to this service. The patient expressed understanding and agreed to proceed.  Subjective: PCP: Michelle Lade, MD  Chief Complaint  Patient presents with   Cough    Coughing with yellow phlegm    [name] has been evaluated today for an acute visit through virtual encounter for evaluation of a cough productive of yellow sputum.  She reports onset of symptoms 4 days ago, which initially included fever/chills, sinus congestion with rhinorrhea, and fatigue in addition to a productive cough.  Overall, symptoms have improved, however more recently she has felt increased congestion in her chest and has been wheezing.  She has a known history of asthma and endorses increased use of her albuterol inhaler.  She completed a home COVID test earlier this week that was negative.  Her husband has been sick recently with URI symptoms.  ROS: Per HPI  Current Outpatient Medications:    azithromycin (ZITHROMAX Z-PAK) 250 MG tablet, Take 2 tablets (500 mg) PO today, then 1 tablet (250 mg) PO daily x4 days., Disp: 6 tablet, Rfl: 0   benzonatate (TESSALON) 100 MG capsule, Take 1 capsule (100 mg total) by mouth 2 (two) times daily as needed for cough., Disp: 20 capsule, Rfl: 0   predniSONE (DELTASONE) 20 MG tablet, Take 2 tablets (40 mg total) by mouth daily for 5 days., Disp: 10 tablet, Rfl: 0   albuterol (VENTOLIN HFA) 108 (90 Base) MCG/ACT inhaler, Inhale 2 puffs into the lungs  every 6 (six) hours as needed for wheezing and/or shortness of breath., Disp: 6.7 g, Rfl: 5   aspirin EC 81 MG tablet, Take 1 tablet every day by oral route., Disp: , Rfl:    azelastine (ASTELIN) 0.1 % nasal spray, Place 1 spray into both nostrils 2 (two) times daily., Disp: 30 mL, Rfl: 2   Blood Glucose Monitoring Suppl (ONETOUCH VERIO FLEX SYSTEM) w/Device KIT, Use as directed to check blood glucose twice daily, before breakfast and before bed., Disp: 1 kit, Rfl: 0   buPROPion (WELLBUTRIN XL) 150 MG 24 hr tablet, Take 1 tablet (150 mg total) by mouth every morning., Disp: 30 tablet, Rfl: 2   cetirizine (ZYRTEC) 10 MG tablet, Take 10 mg by mouth at bedtime., Disp: , Rfl:    clonazePAM (KLONOPIN) 0.5 MG tablet, Take 1 tablet (0.5 mg total) every morning AND 2 tablets (1 mg total) at bedtime., Disp: 90 tablet, Rfl: 2   Coenzyme Q10 (COQ10 PO), Take by mouth. One daily, Disp: , Rfl:    Continuous Glucose Sensor (FREESTYLE LIBRE 3 SENSOR) MISC, Apply new sensor every 14 days to monitor blood glucose. Remove old sensor before applying new one., Disp: 6 each, Rfl: 3   cycloSPORINE (RESTASIS) 0.05 % ophthalmic emulsion, Place 1 drop into both eyes 2 (two) times daily., Disp: 180 each, Rfl: 99   estradiol (ESTRACE) 0.1 MG/GM vaginal cream, Place 1 Applicatorful vaginally 3 (three) times a week., Disp: 42.5 g, Rfl: 1   ferrous sulfate 325 (65 FE) MG EC  tablet, Take 325 mg by mouth 3 (three) times daily with meals. Slow FE, Disp: , Rfl:    FLUoxetine (PROZAC) 40 MG capsule, Take 1 capsule (40 mg total) by mouth daily., Disp: 30 capsule, Rfl: 2   fluticasone (FLONASE) 50 MCG/ACT nasal spray, Place 1 spray into both nostrils daily., Disp: , Rfl:    Galcanezumab-gnlm (EMGALITY) 120 MG/ML SOSY, Inject 1 mL into the skin every 30 (thirty) days., Disp: 1 mL, Rfl: 11   Glucos-Chond-Hyal Ac-Ca Fructo (MOVE FREE JOINT HEALTH ADVANCE PO), Take by mouth., Disp: , Rfl:    hydrOXYzine (ATARAX) 10 MG tablet, Take 1 tablet  (10 mg total) by mouth 2 (two) times daily as needed., Disp: 60 tablet, Rfl: 2   Immune Globulin, Human,-klhw (XEMBIFY) 10 GM/50ML SOLN, Inject 10 g into the skin every 7 (seven) days., Disp: 200 mL, Rfl: 3   insulin glargine (LANTUS) 100 UNIT/ML Solostar Pen, Inject 50 Units into the skin at bedtime., Disp: 45 mL, Rfl: 1   ipratropium-albuterol (DUONEB) 0.5-2.5 (3) MG/3ML SOLN, Take 3 mLs by nebulization 2 (two) times daily as needed., Disp: 90 mL, Rfl: 2   lamoTRIgine (LAMICTAL) 25 MG tablet, Take 1 tablet (25 mg total) by mouth 2 (two) times daily., Disp: 60 tablet, Rfl: 2   Lancets (ONETOUCH ULTRASOFT) lancets, 1 each by Other route as needed. , Disp: , Rfl:    Latanoprostene Bunod (VYZULTA) 0.024 % SOLN, Place 1 drop into both eyes at bedtime as directed, Disp: 7.5 mL, Rfl: 3   lidocaine (LIDODERM) 5 %, Place 1 patch onto the skin daily, may wear up to 12 hours, Disp: 30 patch, Rfl: 5   lubiprostone (AMITIZA) 24 MCG capsule, Take 1 capsule (24 mcg total) by mouth 2 (two) times daily., Disp: 60 capsule, Rfl: 3   Melatonin 10 MG TABS, Take 1 tablet every day by oral route at bedtime., Disp: , Rfl:    Multiple Vitamin (MULTIVITAMIN) tablet, Take 1 tablet by mouth daily., Disp: , Rfl:    omeprazole (PRILOSEC) 20 MG capsule, Take 1 capsule (20 mg total) by mouth 2 (two) times daily., Disp: 60 capsule, Rfl: 2   ondansetron (ZOFRAN-ODT) 8 MG disintegrating tablet, Dissolve 1 tablet (8 mg total) on tongue every 8 (eight) hours as needed for nausea or vomiting., Disp: 20 tablet, Rfl: 6   ONETOUCH VERIO test strip, 4 (four) times daily., Disp: , Rfl:    OVER THE COUNTER MEDICATION, Magnessium 500 mg at night, Disp: , Rfl:    OVER THE COUNTER MEDICATION, Digestive enzymes one at night, Disp: , Rfl:    pregabalin (LYRICA) 100 MG capsule, Take 2 capsules (200 mg total) by mouth 2 (two) times daily., Disp: 120 capsule, Rfl: 0   pregabalin (LYRICA) 200 MG capsule, Take 1 capsule (200 mg total) by mouth 2  (two) times daily., Disp: 60 capsule, Rfl: 2   Rimegepant Sulfate (NURTEC) 75 MG TBDP, Take 75mg  once daily as needed for headache, Disp: 8 tablet, Rfl: 11   rosuvastatin (CRESTOR) 40 MG tablet, Take 1 tablet (40 mg total) by mouth at bedtime., Disp: 90 tablet, Rfl: 1   sennosides-docusate sodium (SENOKOT-S) 8.6-50 MG tablet, Take 2 tablets by mouth at bedtime., Disp: , Rfl:    tiZANidine (ZANAFLEX) 4 MG tablet, Take 4 mg by mouth daily. 4mg  at bedtime and 2 mg during the day, Disp: , Rfl:    traZODone (DESYREL) 50 MG tablet, Take 1 tablet (50 mg total) by mouth at bedtime., Disp: 30 tablet,  Rfl: 2   UNABLE TO FIND, 1 each by Does not apply route daily. Med Name: CPAP SUPPLIES Full face mask Cushion for mask -Medium ResMed, Disp: 1 each, Rfl: 11   VITAMIN D PO, Take 5,000 Units by mouth daily., Disp: , Rfl:    zinc gluconate 50 MG tablet, Take 50 mg by mouth daily., Disp: , Rfl:   Assessment and Plan:  Asthma exacerbation, mild Assessment & Plan: Evaluated today through video encounter for an acute visit endorsing symptoms noted above.  Most prominent is a cough productive of yellow sputum.  She additionally endorses increased frequency of albuterol inhaler use.  Known history of asthma.  She has checked her pulse ox at home and reports a low reading of 94%.  She describes a recent upper respiratory tract infection.  Symptoms are concerning for asthma exacerbation. -Treat options reviewed.  Z-Pak and prednisone 40 mg x 5 days prescribed.  Tessalon Perles added for cough relief.  She was instructed to monitor her O2 saturation with her home pulse oximeter and seek care if she feels more short of breath or O2 sats decrease below 92%.  Otherwise, she will return to care for routine follow-up in early April.  Follow Up Instructions: Return if symptoms worsen or fail to improve.   I discussed the assessment and treatment plan with the patient. The patient was provided an opportunity to ask questions,  and all were answered. The patient agreed with the plan and demonstrated an understanding of the instructions.   The patient was advised to call back or seek an in-person evaluation if the symptoms worsen or if the condition fails to improve as anticipated.  The above assessment and management plan was discussed with the patient. The patient verbalized understanding of and has agreed to the management plan.   Michelle Lade, MD

## 2024-01-25 NOTE — Telephone Encounter (Signed)
Chief Complaint: Productive Cough, SOB Symptoms: Productive cough, sob with exertion Frequency: since Sunday Pertinent Negatives: Patient denies fever Disposition: [] ED /[] Urgent Care (no appt availability in office) / [x] Appointment(In office/virtual)/ []  [facility]/ [] Home Care/ [] Refused Recommended Disposition /[] [facility]/ []  Follow-up with PCP Additional Notes: Patient called in stating she has been feeling ill since Sunday with a productive cough (yellow phlegm) and shortness of breath with coughing/exertion. Patient is asthmatic and states she feels better after she utilizes her inhaler 3-4 times and is able to cough up phlegm, but she is feeling tightness at the top of her lungs when she coughs. Patient states last night she was feeling pain in tonsils. Patient denies fever. Patient's husband is out of town, patient has virtual visit for today with PCP.   Copied from CRM [id number]. Topic: Clinical - Red Word Triage >> Jan 25, 2024  9:30 AM Thomes Dinning wrote: Red Word that prompted transfer to Nurse Triage: Patient believes she may have some type of respiratory infection. She had a cough since last week. She is coughing up phlegm and she is having some trouble breathing. Reason for Disposition  [1] Known COPD or other severe lung disease (i.e., bronchiectasis, cystic fibrosis, lung surgery) AND [2] worsening symptoms (i.e., increased sputum purulence or amount, increased breathing difficulty  Answer Assessment - Initial Assessment Questions 1. ONSET: "When did the cough begin?"      Sunday 2. SEVERITY: "How bad is the cough today?"      Cough is productive, feels like I have trouble breathing at top part of lungs hurt 3. SPUTUM: "Describe the color of your sputum" (none, dry cough; clear, white, yellow, green)     Yellow 4. HEMOPTYSIS: "Are you coughing up any blood?" If so ask: "How much?" (flecks, streaks, tablespoons, etc.)     No 5. DIFFICULTY BREATHING: "Are  you having difficulty breathing?" If Yes, ask: "How bad is it?" (e.g., mild, moderate, severe)    - MILD: No SOB at rest, mild SOB with walking, speaks normally in sentences, can lie down, no retractions, pulse < 100.    - MODERATE: SOB at rest, SOB with minimal exertion and prefers to sit, cannot lie down flat, speaks in phrases, mild retractions, audible wheezing, pulse 100-120.    - SEVERE: Very SOB at rest, speaks in single words, struggling to breathe, sitting hunched forward, retractions, pulse > 120      Moderate with exertion with coughing 6. FEVER: "Do you have a fever?" If Yes, ask: "What is your temperature, how was it measured, and when did it start?"     Not that I know of 7. CARDIAC HISTORY: "Do you have any history of heart disease?" (e.g., heart attack, congestive heart failure)      No 8. LUNG HISTORY: "Do you have any history of lung disease?"  (e.g., pulmonary embolus, asthma, emphysema)     Asthma  9. PE RISK FACTORS: "Do you have a history of blood clots?" (or: recent major surgery, recent prolonged travel, bedridden)     No 10. OTHER SYMPTOMS: "Do you have any other symptoms?" (e.g., runny nose, wheezing, chest pain)       Runny nose, Wheezing, tightness at top of lungs  Protocols used: Cough - Acute Productive-A-AH

## 2024-01-25 NOTE — Assessment & Plan Note (Signed)
Evaluated today through video encounter for an acute visit endorsing symptoms noted above.  Most prominent is a cough productive of yellow sputum.  She additionally endorses increased frequency of albuterol inhaler use.  Known history of asthma.  She has checked her pulse ox at home and reports a low reading of 94%.  She describes a recent upper respiratory tract infection.  Symptoms are concerning for asthma exacerbation. -Treat options reviewed.  Z-Pak and prednisone 40 mg x 5 days prescribed.  Tessalon Perles added for cough relief.  She was instructed to monitor her O2 saturation with her home pulse oximeter and seek care if she feels more short of breath or O2 sats decrease below 92%.  Otherwise, she will return to care for routine follow-up in early April.

## 2024-01-26 ENCOUNTER — Other Ambulatory Visit (HOSPITAL_COMMUNITY): Payer: Self-pay

## 2024-01-26 ENCOUNTER — Ambulatory Visit: Payer: PPO | Admitting: Family Medicine

## 2024-01-26 DIAGNOSIS — Z124 Encounter for screening for malignant neoplasm of cervix: Secondary | ICD-10-CM

## 2024-01-29 ENCOUNTER — Other Ambulatory Visit (HOSPITAL_COMMUNITY): Payer: Self-pay

## 2024-01-29 ENCOUNTER — Other Ambulatory Visit (HOSPITAL_COMMUNITY): Payer: Self-pay | Admitting: Psychiatry

## 2024-01-29 ENCOUNTER — Other Ambulatory Visit: Payer: Self-pay

## 2024-01-29 DIAGNOSIS — D839 Common variable immunodeficiency, unspecified: Secondary | ICD-10-CM | POA: Diagnosis not present

## 2024-01-29 DIAGNOSIS — D801 Nonfamilial hypogammaglobulinemia: Secondary | ICD-10-CM | POA: Diagnosis not present

## 2024-01-29 MED ORDER — FLUOXETINE HCL 40 MG PO CAPS
40.0000 mg | ORAL_CAPSULE | Freq: Every day | ORAL | 2 refills | Status: DC
  Filled 2024-02-08 – 2024-02-13 (×2): qty 30, 30d supply, fill #0
  Filled 2024-02-16: qty 30, 30d supply, fill #1

## 2024-01-29 MED ORDER — BUPROPION HCL ER (XL) 150 MG PO TB24
150.0000 mg | ORAL_TABLET | Freq: Every morning | ORAL | 2 refills | Status: DC
  Filled 2024-02-08 – 2024-02-13 (×2): qty 30, 30d supply, fill #0
  Filled 2024-02-16: qty 30, 30d supply, fill #1

## 2024-01-29 MED ORDER — TRAZODONE HCL 50 MG PO TABS
50.0000 mg | ORAL_TABLET | Freq: Every day | ORAL | 2 refills | Status: DC
  Filled 2024-02-08 – 2024-02-13 (×2): qty 30, 30d supply, fill #0
  Filled 2024-02-16 – 2024-03-12 (×2): qty 30, 30d supply, fill #1

## 2024-01-29 MED ORDER — LAMOTRIGINE 25 MG PO TABS
25.0000 mg | ORAL_TABLET | Freq: Two times a day (BID) | ORAL | 2 refills | Status: DC
  Filled 2024-02-08 – 2024-02-13 (×2): qty 60, 30d supply, fill #0
  Filled 2024-02-16: qty 60, 30d supply, fill #1

## 2024-01-29 NOTE — Telephone Encounter (Signed)
 Call for appt

## 2024-01-30 ENCOUNTER — Other Ambulatory Visit: Payer: Self-pay

## 2024-01-31 ENCOUNTER — Encounter: Payer: Self-pay | Admitting: Physician Assistant

## 2024-02-01 ENCOUNTER — Other Ambulatory Visit (HOSPITAL_COMMUNITY): Payer: Self-pay

## 2024-02-01 ENCOUNTER — Other Ambulatory Visit: Payer: Self-pay

## 2024-02-01 ENCOUNTER — Encounter: Payer: Self-pay | Admitting: Nurse Practitioner

## 2024-02-01 DIAGNOSIS — M546 Pain in thoracic spine: Secondary | ICD-10-CM | POA: Diagnosis not present

## 2024-02-01 DIAGNOSIS — M47816 Spondylosis without myelopathy or radiculopathy, lumbar region: Secondary | ICD-10-CM | POA: Diagnosis not present

## 2024-02-01 DIAGNOSIS — M797 Fibromyalgia: Secondary | ICD-10-CM | POA: Diagnosis not present

## 2024-02-01 DIAGNOSIS — M7918 Myalgia, other site: Secondary | ICD-10-CM | POA: Diagnosis not present

## 2024-02-01 DIAGNOSIS — E1165 Type 2 diabetes mellitus with hyperglycemia: Secondary | ICD-10-CM

## 2024-02-01 DIAGNOSIS — M5412 Radiculopathy, cervical region: Secondary | ICD-10-CM | POA: Diagnosis not present

## 2024-02-01 MED ORDER — ONETOUCH VERIO VI STRP
ORAL_STRIP | 3 refills | Status: AC
  Filled 2024-02-01: qty 100, 100d supply, fill #0
  Filled 2024-04-25: qty 100, 100d supply, fill #1
  Filled 2024-07-16: qty 100, 100d supply, fill #2
  Filled 2024-08-29 – 2024-11-26 (×2): qty 100, 100d supply, fill #3

## 2024-02-01 MED ORDER — PREGABALIN 200 MG PO CAPS
200.0000 mg | ORAL_CAPSULE | Freq: Three times a day (TID) | ORAL | 2 refills | Status: DC
Start: 2024-02-01 — End: 2024-06-20
  Filled 2024-02-01: qty 90, 30d supply, fill #0
  Filled 2024-03-20: qty 90, 30d supply, fill #1
  Filled 2024-04-29: qty 90, 30d supply, fill #2

## 2024-02-01 MED ORDER — ONETOUCH VERIO W/DEVICE KIT
PACK | 0 refills | Status: AC
  Filled 2024-02-01: qty 1, 1d supply, fill #0

## 2024-02-01 MED ORDER — ONETOUCH ULTRASOFT LANCETS MISC
1.0000 | 3 refills | Status: AC | PRN
  Filled 2024-02-01: qty 100, 100d supply, fill #0
  Filled 2024-07-16: qty 100, 100d supply, fill #1

## 2024-02-02 ENCOUNTER — Other Ambulatory Visit (HOSPITAL_COMMUNITY): Payer: Self-pay

## 2024-02-02 ENCOUNTER — Other Ambulatory Visit: Payer: Self-pay

## 2024-02-02 ENCOUNTER — Encounter (HOSPITAL_COMMUNITY): Payer: Self-pay

## 2024-02-05 DIAGNOSIS — H02402 Unspecified ptosis of left eyelid: Secondary | ICD-10-CM | POA: Diagnosis not present

## 2024-02-05 DIAGNOSIS — G4486 Cervicogenic headache: Secondary | ICD-10-CM | POA: Diagnosis not present

## 2024-02-05 DIAGNOSIS — G43709 Chronic migraine without aura, not intractable, without status migrainosus: Secondary | ICD-10-CM | POA: Diagnosis not present

## 2024-02-06 ENCOUNTER — Other Ambulatory Visit: Payer: Self-pay

## 2024-02-08 ENCOUNTER — Other Ambulatory Visit (HOSPITAL_BASED_OUTPATIENT_CLINIC_OR_DEPARTMENT_OTHER): Payer: Self-pay

## 2024-02-08 ENCOUNTER — Other Ambulatory Visit: Payer: Self-pay | Admitting: Internal Medicine

## 2024-02-08 ENCOUNTER — Other Ambulatory Visit: Payer: Self-pay

## 2024-02-08 MED ORDER — OMEPRAZOLE 20 MG PO CPDR
20.0000 mg | DELAYED_RELEASE_CAPSULE | Freq: Two times a day (BID) | ORAL | 2 refills | Status: DC
  Filled 2024-02-08 – 2024-02-13 (×2): qty 60, 30d supply, fill #0
  Filled 2024-02-16 – 2024-03-12 (×3): qty 60, 30d supply, fill #1
  Filled 2024-03-27 – 2024-04-09 (×2): qty 60, 30d supply, fill #2

## 2024-02-12 ENCOUNTER — Other Ambulatory Visit: Payer: Self-pay

## 2024-02-12 DIAGNOSIS — D801 Nonfamilial hypogammaglobulinemia: Secondary | ICD-10-CM | POA: Diagnosis not present

## 2024-02-13 ENCOUNTER — Other Ambulatory Visit (HOSPITAL_COMMUNITY): Payer: Self-pay

## 2024-02-13 ENCOUNTER — Other Ambulatory Visit: Payer: Self-pay

## 2024-02-13 ENCOUNTER — Encounter (HOSPITAL_COMMUNITY): Payer: Self-pay | Admitting: Hematology & Oncology

## 2024-02-13 ENCOUNTER — Telehealth (INDEPENDENT_AMBULATORY_CARE_PROVIDER_SITE_OTHER): Payer: Self-pay | Admitting: Gastroenterology

## 2024-02-13 NOTE — Telephone Encounter (Signed)
 Needs to hold it from now on

## 2024-02-13 NOTE — Telephone Encounter (Signed)
 Pt called in and states that she has been taking her iron and has EGD on 3/14. Can pt proceed with EGD still? Please advise. Thank you

## 2024-02-13 NOTE — Telephone Encounter (Signed)
 Pt contacted and verbalized understanding.

## 2024-02-13 NOTE — Telephone Encounter (Signed)
Yes, OK to proceed. Thanks.

## 2024-02-14 ENCOUNTER — Encounter (HOSPITAL_COMMUNITY): Payer: Self-pay | Admitting: Hematology & Oncology

## 2024-02-14 ENCOUNTER — Other Ambulatory Visit: Payer: Self-pay

## 2024-02-14 ENCOUNTER — Other Ambulatory Visit (HOSPITAL_COMMUNITY): Payer: Self-pay | Admitting: Psychiatry

## 2024-02-14 ENCOUNTER — Other Ambulatory Visit (HOSPITAL_COMMUNITY): Payer: Self-pay

## 2024-02-14 MED ORDER — ONDANSETRON 8 MG PO TBDP
8.0000 mg | ORAL_TABLET | Freq: Three times a day (TID) | ORAL | 6 refills | Status: AC | PRN
  Filled 2024-02-14: qty 20, 7d supply, fill #0

## 2024-02-15 ENCOUNTER — Other Ambulatory Visit: Payer: Self-pay

## 2024-02-15 ENCOUNTER — Other Ambulatory Visit (HOSPITAL_COMMUNITY): Payer: Self-pay

## 2024-02-15 MED ORDER — CLONAZEPAM 0.5 MG PO TABS
ORAL_TABLET | ORAL | 2 refills | Status: DC
  Filled 2024-02-15 – 2024-02-27 (×2): qty 90, 30d supply, fill #0

## 2024-02-15 MED ORDER — TIZANIDINE HCL 4 MG PO TABS
4.0000 mg | ORAL_TABLET | Freq: Two times a day (BID) | ORAL | 11 refills | Status: AC | PRN
  Filled 2024-02-15: qty 60, 30d supply, fill #0
  Filled 2024-02-16 – 2024-03-09 (×3): qty 60, 30d supply, fill #1
  Filled 2024-03-20 – 2024-04-25 (×2): qty 60, 30d supply, fill #2
  Filled 2024-07-09: qty 60, 30d supply, fill #3
  Filled 2024-10-22: qty 60, 30d supply, fill #4
  Filled 2024-11-26: qty 60, 30d supply, fill #5

## 2024-02-15 NOTE — Telephone Encounter (Signed)
 Call for appt

## 2024-02-16 ENCOUNTER — Ambulatory Visit (HOSPITAL_BASED_OUTPATIENT_CLINIC_OR_DEPARTMENT_OTHER): Admitting: Anesthesiology

## 2024-02-16 ENCOUNTER — Ambulatory Visit (HOSPITAL_COMMUNITY)
Admission: RE | Admit: 2024-02-16 | Discharge: 2024-02-16 | Disposition: A | Discharge status: Home / Self Care | Payer: PPO | Attending: Gastroenterology | Admitting: Gastroenterology

## 2024-02-16 ENCOUNTER — Ambulatory Visit (HOSPITAL_COMMUNITY): Admitting: Anesthesiology

## 2024-02-16 ENCOUNTER — Encounter (HOSPITAL_COMMUNITY): Payer: Self-pay | Admitting: Gastroenterology

## 2024-02-16 ENCOUNTER — Other Ambulatory Visit: Payer: Self-pay

## 2024-02-16 ENCOUNTER — Encounter (HOSPITAL_COMMUNITY)
Admission: RE | Disposition: A | Discharge status: Home / Self Care | Payer: Self-pay | Source: Home / Self Care | Attending: Gastroenterology

## 2024-02-16 DIAGNOSIS — G4733 Obstructive sleep apnea (adult) (pediatric): Secondary | ICD-10-CM | POA: Insufficient documentation

## 2024-02-16 DIAGNOSIS — R1013 Epigastric pain: Secondary | ICD-10-CM | POA: Diagnosis not present

## 2024-02-16 DIAGNOSIS — N189 Chronic kidney disease, unspecified: Secondary | ICD-10-CM | POA: Diagnosis not present

## 2024-02-16 DIAGNOSIS — I10 Essential (primary) hypertension: Secondary | ICD-10-CM

## 2024-02-16 DIAGNOSIS — Z87891 Personal history of nicotine dependence: Secondary | ICD-10-CM | POA: Insufficient documentation

## 2024-02-16 DIAGNOSIS — E119 Type 2 diabetes mellitus without complications: Secondary | ICD-10-CM | POA: Diagnosis not present

## 2024-02-16 DIAGNOSIS — R1310 Dysphagia, unspecified: Secondary | ICD-10-CM | POA: Insufficient documentation

## 2024-02-16 DIAGNOSIS — K219 Gastro-esophageal reflux disease without esophagitis: Secondary | ICD-10-CM | POA: Diagnosis not present

## 2024-02-16 DIAGNOSIS — K2950 Unspecified chronic gastritis without bleeding: Secondary | ICD-10-CM | POA: Insufficient documentation

## 2024-02-16 DIAGNOSIS — K317 Polyp of stomach and duodenum: Secondary | ICD-10-CM | POA: Diagnosis not present

## 2024-02-16 DIAGNOSIS — I129 Hypertensive chronic kidney disease with stage 1 through stage 4 chronic kidney disease, or unspecified chronic kidney disease: Secondary | ICD-10-CM | POA: Diagnosis not present

## 2024-02-16 DIAGNOSIS — K31A0 Gastric intestinal metaplasia, unspecified: Secondary | ICD-10-CM | POA: Insufficient documentation

## 2024-02-16 DIAGNOSIS — E1122 Type 2 diabetes mellitus with diabetic chronic kidney disease: Secondary | ICD-10-CM | POA: Insufficient documentation

## 2024-02-16 DIAGNOSIS — Z794 Long term (current) use of insulin: Secondary | ICD-10-CM | POA: Diagnosis not present

## 2024-02-16 DIAGNOSIS — J45909 Unspecified asthma, uncomplicated: Secondary | ICD-10-CM | POA: Insufficient documentation

## 2024-02-16 DIAGNOSIS — K31A19 Gastric intestinal metaplasia without dysplasia, unspecified site: Secondary | ICD-10-CM

## 2024-02-16 DIAGNOSIS — M797 Fibromyalgia: Secondary | ICD-10-CM | POA: Diagnosis not present

## 2024-02-16 DIAGNOSIS — R112 Nausea with vomiting, unspecified: Secondary | ICD-10-CM

## 2024-02-16 DIAGNOSIS — K3189 Other diseases of stomach and duodenum: Secondary | ICD-10-CM | POA: Diagnosis not present

## 2024-02-16 HISTORY — DX: Nausea with vomiting, unspecified: R11.2

## 2024-02-16 HISTORY — PX: ESOPHAGOGASTRODUODENOSCOPY (EGD) WITH PROPOFOL: SHX5813

## 2024-02-16 HISTORY — PX: SAVORY DILATION: SHX5439

## 2024-02-16 HISTORY — PX: POLYPECTOMY: SHX5525

## 2024-02-16 LAB — GLUCOSE, CAPILLARY
Glucose-Capillary: 107 mg/dL — ABNORMAL HIGH (ref 70–99)
Glucose-Capillary: 104 mg/dL — ABNORMAL HIGH (ref 70–99)

## 2024-02-16 SURGERY — ESOPHAGOGASTRODUODENOSCOPY (EGD) WITH PROPOFOL
Anesthesia: General

## 2024-02-16 MED ORDER — LACTATED RINGERS IV SOLN: INTRAVENOUS | Status: DC

## 2024-02-16 MED ORDER — LIDOCAINE HCL (CARDIAC) PF 100 MG/5ML IV SOSY
PREFILLED_SYRINGE | INTRAVENOUS | Status: DC | PRN
  Administered 2024-02-16: 80 mg via INTRATRACHEAL

## 2024-02-16 MED ORDER — PROPOFOL 10 MG/ML IV BOLUS
INTRAVENOUS | Status: DC | PRN
  Administered 2024-02-16: 30 mg via INTRAVENOUS
  Administered 2024-02-16: 50 mg via INTRAVENOUS
  Administered 2024-02-16: 100 mg via INTRAVENOUS
  Administered 2024-02-16: 150 mg via INTRAVENOUS

## 2024-02-16 NOTE — Anesthesia Preprocedure Evaluation (Signed)
 Anesthesia Evaluation  Patient identified by MRN, date of birth, ID band Patient awake    Reviewed: Allergy & Precautions, H&P , NPO status , Patient's Chart, lab work & pertinent test results, reviewed documented beta blocker date and time   Airway Mallampati: II  TM Distance: >3 FB Neck ROM: full    Dental no notable dental hx.    Pulmonary neg pulmonary ROS, asthma , sleep apnea , former smoker   Pulmonary exam normal breath sounds clear to auscultation       Cardiovascular Exercise Tolerance: Good hypertension, negative cardio ROS  Rhythm:regular Rate:Normal     Neuro/Psych  Headaches PSYCHIATRIC DISORDERS Anxiety Depression Bipolar Disorder    Neuromuscular disease negative neurological ROS  negative psych ROS   GI/Hepatic negative GI ROS, Neg liver ROS,GERD  ,,  Endo/Other  negative endocrine ROSdiabetesHypothyroidism    Renal/GU Renal diseasenegative Renal ROS  negative genitourinary   Musculoskeletal   Abdominal   Peds  Hematology negative hematology ROS (+) Blood dyscrasia, anemia   Anesthesia Other Findings   Reproductive/Obstetrics negative OB ROS                             Anesthesia Physical Anesthesia Plan  ASA: 3  Anesthesia Plan: General   Post-op Pain Management:    Induction:   PONV Risk Score and Plan: Propofol infusion  Airway Management Planned:   Additional Equipment:   Intra-op Plan:   Post-operative Plan:   Informed Consent: I have reviewed the patients History and Physical, chart, labs and discussed the procedure including the risks, benefits and alternatives for the proposed anesthesia with the patient or authorized representative who has indicated his/her understanding and acceptance.     Dental Advisory Given  Plan Discussed with: CRNA  Anesthesia Plan Comments:        Anesthesia Quick Evaluation

## 2024-02-16 NOTE — Transfer of Care (Signed)
 Immediate Anesthesia Transfer of Care Note  Patient: RODNEY WIGGER  Procedure(s) Performed: ESOPHAGOGASTRODUODENOSCOPY (EGD) WITH PROPOFOL POLYPECTOMY EGD, WITH DILATION USING SAVARY-GILLIARD DILATOR OVER GUIDEWIRE  Patient Location: Endoscopy Unit  Anesthesia Type:General  Level of Consciousness: drowsy  Airway & Oxygen Therapy: Patient Spontanous Breathing  Post-op Assessment: Report given to RN and Post -op Vital signs reviewed and stable  Post vital signs: Reviewed and stable  Last Vitals:  Vitals Value Taken Time  BP 124/49 02/16/24 1129  Temp 36.4 C 02/16/24 1129  Pulse 74 02/16/24 1129  Resp 11 02/16/24 1129  SpO2 96 % 02/16/24 1129    Last Pain:  Vitals:   02/16/24 1129  TempSrc: Oral  PainSc: 0-No pain      Patients Stated Pain Goal: 7 (02/16/24 1017)  Complications: No notable events documented.

## 2024-02-16 NOTE — Discharge Instructions (Signed)
You are being discharged to home.  Resume your previous diet.  We are waiting for your pathology results.  

## 2024-02-16 NOTE — Op Note (Signed)
 [facility] Patient Name: Michelle Clements Procedure Date: 02/16/2024 10:56 AM MRN: [id number] Date of Birth: [birth year] Attending MD: [name] , , [id number] CSN: [id number] Age: 58 Admit Type: Outpatient Procedure:                Upper GI endoscopy Indications:              Dysphagia, Nausea Providers:                [name], Francoise Ceo RN, RN, Kristine L.                            Jessee Avers, Technician Referring MD:              Medicines:                Monitored Anesthesia Care Complications:            No immediate complications. Estimated Blood Loss:     Estimated blood loss: none. Procedure:                Pre-Anesthesia Assessment:                           - Prior to the procedure, a History and Physical                            was performed, and patient medications, allergies                            and sensitivities were reviewed. The patient's                            tolerance of previous anesthesia was reviewed.                           After obtaining informed consent, the endoscope was                            passed under direct vision. Throughout the                            procedure, the patient's blood pressure, pulse, and                            oxygen saturations were monitored continuously. The                            GIF-[id number] ([id number]) scope was introduced through the                            mouth, and advanced to the second part of duodenum.                            The upper GI endoscopy was accomplished without                            difficulty. The patient  tolerated the procedure                            well. Scope In: 11:13:38 AM Scope Out: 11:24:39 AM Total Procedure Duration: 0 hours 11 minutes 1 second  Findings:      No endoscopic abnormality was evident in the esophagus to explain the       patient's complaint of dysphagia. It was decided, however, to proceed       with dilation of the entire  esophagus. A guidewire was placed and the       scope was withdrawn. Dilation was performed with a Savary dilator with       no resistance at 18 mm. The dilation site was examined following       endoscope reinsertion and showed no change.      A few 2 to 4 mm sessile polyps with no bleeding were found in the       gastric body. One polyp with erythematous appearance was removed with a       cold snare. Resection and retrieval were complete.      The rest of the examined stomach was normal. Biopsies were taken with a       cold forceps for Helicobacter pylori testing.      The examined duodenum was normal. Biopsies were taken with a cold       forceps for histology. Impression:               - No endoscopic esophageal abnormality to explain                            patient's dysphagia. Esophagus dilated. Dilated.                           - A few gastric polyps. One polyp resected and                            retrieved.                           - Normal stomach. Biopsied.                           - Normal examined duodenum. Biopsied. Moderate Sedation:      Per Anesthesia Care Recommendation:           - Discharge patient to home (ambulatory).                           - Resume previous diet.                           - Await pathology results. Procedure Code(s):        --- Professional ---                           [phone], Esophagogastroduodenoscopy, flexible,                            transoral; with removal of tumor(s), polyp(s), or  other lesion(s) by snare technique                           43248, Esophagogastroduodenoscopy, flexible,                            transoral; with insertion of guide wire followed by                            passage of dilator(s) through esophagus over guide                            wire                           43239, 59, Esophagogastroduodenoscopy, flexible,                            transoral; with biopsy,  single or multiple Diagnosis Code(s):        --- Professional ---                           R13.10, Dysphagia, unspecified                           K31.7, Polyp of stomach and duodenum                           R11.0, Nausea CPT copyright 2022 [employer]. All rights reserved. The codes documented in this report are preliminary and upon coder review may  be revised to meet current compliance requirements. [name], MD [name],  02/16/2024 11:31:27 AM This report has been signed electronically. Number of Addenda: 0

## 2024-02-16 NOTE — H&P (Signed)
 Michelle Clements is an 58 y.o. female.   Chief Complaint: dysphagia and nausea.  HPI: Michelle Clements is a 58 y.o. female with past medical endoscopic ultrasound CVID, depression, bipolar disorder, asthma, depression, GERD, IBS-C, PTSD, diabetes, interstitial cystitis, CKD,, fibromyalgia, who presents for evaluation of dysphagia and nausea.   Patient reports that she has had recurrent issues with nausea.  Also is having dysphagia intermittently, feeling as if her throat is not clearing up correctly.  Past Medical History:  Diagnosis Date   Allergic rhinitis    Anemia    Arthritis    Lower back and hips   Asthma    Bipolar disorder (HCC)    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HCC)    Constipation    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HCC)    Depression    Essential hypertension, benign    Fibromyalgia    GERD (gastroesophageal reflux disease)    H/O sleep apnea    Hip pain, left    History of palpitations    Negative Holter monitor   History of pneumonia 1988   Hyperlipidemia    IBS (irritable bowel syndrome)    Neck pain    Obstructive sleep apnea (adult) (pediatric) 04/12/2021   Poor short term memory    PTSD (post-traumatic stress disorder)    S/P Botox injection 11/2014    For migraine headaches   Type 2 diabetes mellitus (HCC)    Urgency of urination     Past Surgical History:  Procedure Laterality Date   CARPAL TUNNEL RELEASE Right 06/10/2013   Procedure: CARPAL TUNNEL RELEASE;  Surgeon: Reinaldo Meeker, MD;  Location: [facility];  Service: Neurosurgery;  Laterality: Right;  Right Carpal Tunnel Release    CHOLECYSTECTOMY     DENTAL SURGERY  12/2014   mole exc     x 3   MUSCLE BIOPSY  2008   Right leg   TUBAL LIGATION      Family History  Problem Relation Age of Onset   Fibromyalgia Mother    Diabetes type II Mother    Depression Mother    Alzheimer's disease Mother    GER disease Mother    Heart disease Mother    Paranoid behavior Mother    Dementia Mother     Heart attack Father 21       MI x5   Stroke Father        CVA x7   Asthma Father    Brain cancer Father    Seizures Father    Anxiety disorder Sister    OCD Sister    Sexual abuse Sister    Physical abuse Sister    Anxiety disorder Sister    ADD / ADHD Daughter    ADD / ADHD Daughter    Alcohol abuse Neg Hx    Drug abuse Neg Hx    Schizophrenia Neg Hx    Social History:  reports that she quit smoking about 13 years ago. Her smoking use included cigarettes. She has been exposed to tobacco smoke. She has never used smokeless tobacco. She reports current alcohol use. She reports that she does not use drugs.  Allergies:  Allergies  Allergen Reactions   Dust Mite Extract Cough, Other (See Comments) and Shortness Of Breath    Wheezing  Other Reaction(s): Cough, Other (See Comments)  Other reaction(s): Wheezing    Wheezing    Wheezing Other reaction(s): Wheezing Wheezing Wheezing Other reaction(s): Wheezing Wheezing Wheezing Other reaction(s): Wheezing Wheezing Wheezing Other  reaction(s): Wheezing Wheezing Wheezing Other reaction(s): Wheezing Wheezing Wheezing Other reaction(s): Wheezing Wheezing Wheezing Other reaction(s): Wheezing Wheezing    Wheezing, Other reaction(s): Wheezing, Wheezing, Wheezing, Other reaction(s): Wheezing, Wheezing, Wheezing, Other reaction(s): Wheezing, Wheezing, Wheezing, Other reaction(s): Wheezing, Wheezing, Wheezing, Other reaction(s): Wheezing, Wheezing, Wheezing, Other reaction(s): Wheezing, Wheezing, Wheezing, Other reaction(s): Wheezing, Wheezing   Molds & Smuts Shortness Of Breath    wheezing  wheezing wheezing wheezing    wheezing, wheezing, wheezing   Sulfa Antibiotics Anaphylaxis     : Angioedema    Trichophyton Shortness Of Breath    wheezing  wheezing    wheezing wheezing wheezing wheezing wheezing wheezing wheezing wheezing wheezing wheezing wheezing wheezing wheezing wheezing wheezing wheezing wheezing    wheezing, wheezing,  wheezing, wheezing, wheezing, wheezing, wheezing, wheezing, wheezing, wheezing, wheezing, wheezing, wheezing, wheezing, wheezing, wheezing, wheezing   Carbamazepine Rash    Other Reaction(s): Other (See Comments)   Gluten Meal Itching   Quetiapine Fumarate Other (See Comments)    Medications Prior to Admission  Medication Sig Dispense Refill   albuterol (VENTOLIN HFA) 108 (90 Base) MCG/ACT inhaler Inhale 2 puffs into the lungs every 6 (six) hours as needed for wheezing and/or shortness of breath. 6.7 g 5   aspirin EC 81 MG tablet Take 1 tablet every day by oral route.     azelastine (ASTELIN) 0.1 % nasal spray Place 1 spray into both nostrils 2 (two) times daily. 30 mL 2   buPROPion (WELLBUTRIN XL) 150 MG 24 hr tablet Take 1 tablet (150 mg total) by mouth every morning. 30 tablet 2   cetirizine (ZYRTEC) 10 MG tablet Take 10 mg by mouth at bedtime.     clonazePAM (KLONOPIN) 0.5 MG tablet Take 1 tablet (0.5 mg total) every morning AND 2 tablets (1 mg total) at bedtime. 90 tablet 2   Coenzyme Q10 (COQ10 PO) Take by mouth. One daily     cycloSPORINE (RESTASIS) 0.05 % ophthalmic emulsion Place 1 drop into both eyes 2 (two) times daily. 180 each 99   estradiol (ESTRACE) 0.1 MG/GM vaginal cream Place 1 Applicatorful vaginally 3 (three) times a week. 42.5 g 1   FLUoxetine (PROZAC) 40 MG capsule Take 1 capsule (40 mg total) by mouth daily. 30 capsule 2   fluticasone (FLONASE) 50 MCG/ACT nasal spray Place 1 spray into both nostrils daily.     Glucos-Chond-Hyal Ac-Ca Fructo (MOVE FREE JOINT HEALTH ADVANCE PO) Take by mouth.     hydrOXYzine (ATARAX) 10 MG tablet Take 1 tablet (10 mg total) by mouth 2 (two) times daily as needed. 60 tablet 2   insulin glargine (LANTUS) 100 UNIT/ML Solostar Pen Inject 50 Units into the skin at bedtime. 45 mL 1   lamoTRIgine (LAMICTAL) 25 MG tablet Take 1 tablet (25 mg total) by mouth 2 (two) times daily. 60 tablet 2   lidocaine (LIDODERM) 5 % Place 1 patch onto the skin  daily, may wear up to 12 hours 30 patch 5   lubiprostone (AMITIZA) 24 MCG capsule Take 1 capsule (24 mcg total) by mouth 2 (two) times daily. 60 capsule 3   Melatonin 10 MG TABS Take 1 tablet every day by oral route at bedtime.     Multiple Vitamin (MULTIVITAMIN) tablet Take 1 tablet by mouth daily.     omeprazole (PRILOSEC) 20 MG capsule Take 1 capsule (20 mg total) by mouth 2 (two) times daily. 60 capsule 2   ONETOUCH VERIO test strip Use to check glucose once daily. 100 each 3  OVER THE COUNTER MEDICATION Magnessium 500 mg at night     pregabalin (LYRICA) 200 MG capsule Take 1 capsule (200 mg total) by mouth 3 (three) times daily. 90 capsule 2   rosuvastatin (CRESTOR) 40 MG tablet Take 1 tablet (40 mg total) by mouth at bedtime. 90 tablet 1   sennosides-docusate sodium (SENOKOT-S) 8.6-50 MG tablet Take 2 tablets by mouth at bedtime.     tiZANidine (ZANAFLEX) 4 MG tablet Take 4 mg by mouth daily. 4mg  at bedtime and 2 mg during the day     tiZANidine (ZANAFLEX) 4 MG tablet Take 1 tablet (4 mg total) by mouth every 12 (twelve) hours as needed. 60 tablet 11   traZODone (DESYREL) 50 MG tablet Take 1 tablet (50 mg total) by mouth at bedtime. 30 tablet 2   VITAMIN D PO Take 5,000 Units by mouth daily.     zinc gluconate 50 MG tablet Take 50 mg by mouth daily.     azithromycin (ZITHROMAX Z-PAK) 250 MG tablet Take 2 tablets (500 mg) PO today, then 1 tablet (250 mg) PO daily x4 days. 6 tablet 0   benzonatate (TESSALON) 100 MG capsule Take 1 capsule (100 mg total) by mouth 2 (two) times daily as needed for cough. 20 capsule 0   Blood Glucose Monitoring Suppl (ONETOUCH VERIO FLEX SYSTEM) w/Device KIT Use as directed to check blood glucose twice daily, before breakfast and before bed. 1 kit 0   Blood Glucose Monitoring Suppl (ONETOUCH VERIO) w/Device KIT Use to check glucose once daily 1 kit 0   Continuous Glucose Sensor (FREESTYLE LIBRE 3 SENSOR) MISC Apply new sensor every 14 days to monitor blood  glucose. Remove old sensor before applying new one. 6 each 3   ferrous sulfate 325 (65 FE) MG EC tablet Take 325 mg by mouth 3 (three) times daily with meals. Slow FE     Galcanezumab-gnlm (EMGALITY) 120 MG/ML SOSY Inject 1 mL into the skin every 30 (thirty) days. 1 mL 11   Immune Globulin, Human,-klhw (XEMBIFY) 10 GM/50ML SOLN Inject 10 g into the skin every 7 (seven) days. 200 mL 3   ipratropium-albuterol (DUONEB) 0.5-2.5 (3) MG/3ML SOLN Take 3 mLs by nebulization 2 (two) times daily as needed. 90 mL 2   Lancets (ONETOUCH ULTRASOFT) lancets Use to check blood sugar once daily 100 each 3   Latanoprostene Bunod (VYZULTA) 0.024 % SOLN Place 1 drop into both eyes at bedtime as directed 7.5 mL 3   ondansetron (ZOFRAN-ODT) 8 MG disintegrating tablet Dissolve 1 tablet (8 mg total) by mouth every 8 (eight) hours as needed for nausea or vomiting. 20 tablet 6   OVER THE COUNTER MEDICATION Digestive enzymes one at night     pregabalin (LYRICA) 100 MG capsule Take 2 capsules (200 mg total) by mouth 2 (two) times daily. 120 capsule 0   pregabalin (LYRICA) 200 MG capsule Take 1 capsule (200 mg total) by mouth 2 (two) times daily. 60 capsule 2   UNABLE TO FIND 1 each by Does not apply route daily. Med Name: CPAP SUPPLIES Full face mask Cushion for mask -Medium ResMed 1 each 11    Results for orders placed or performed during the hospital encounter of 02/16/24 (from the past 48 hours)  Glucose, capillary     Status: Abnormal   Collection Time: 02/16/24 10:19 AM  Result Value Ref Range   Glucose-Capillary 107 (H) 70 - 99 mg/dL    Comment: Glucose reference range applies only to samples taken  after fasting for at least 8 hours.   *Note: Due to a large number of results and/or encounters for the requested time period, some results have not been displayed. A complete set of results can be found in Results Review.   No results found.  Review of Systems  HENT:  Positive for trouble swallowing.    Gastrointestinal:  Positive for abdominal distention and nausea.  All other systems reviewed and are negative.   Blood pressure (!) 141/75, pulse 71, temperature 98.1 F (36.7 C), temperature source Oral, resp. rate 10, height 5\' 11"  (1.803 m), weight 103.9 kg, last menstrual period 01/04/2018, SpO2 97%. Physical Exam  GENERAL: The patient is AO x3, in no acute distress. HEENT: Head is normocephalic and atraumatic. EOMI are intact. Mouth is well hydrated and without lesions. NECK: Supple. No masses LUNGS: Clear to auscultation. No presence of rhonchi/wheezing/rales. Adequate chest expansion HEART: RRR, normal s1 and s2. ABDOMEN: Soft, nontender, no guarding, no peritoneal signs, and nondistended. BS +. No masses. EXTREMITIES: Without any cyanosis, clubbing, rash, lesions or edema. NEUROLOGIC: AOx3, no focal motor deficit. SKIN: no jaundice, no rashes  Assessment/Plan ALEKSIS JIGGETTS is a 58 y.o. female with past medical endoscopic ultrasound CVID, depression, bipolar disorder, asthma, depression, GERD, IBS-C, PTSD, diabetes, interstitial cystitis, CKD,, fibromyalgia, who presents for evaluation of dysphagia and nausea.  We will proceed with EGD with possible dilation.  Dolores Frame, MD 02/16/2024, 10:56 AM

## 2024-02-16 NOTE — Anesthesia Postprocedure Evaluation (Signed)
 Anesthesia Post Note  Patient: Michelle Clements  Procedure(s) Performed: ESOPHAGOGASTRODUODENOSCOPY (EGD) WITH PROPOFOL POLYPECTOMY EGD, WITH DILATION USING SAVARY-GILLIARD DILATOR OVER GUIDEWIRE  Patient location during evaluation: Phase II Anesthesia Type: General Level of consciousness: awake Pain management: pain level controlled Vital Signs Assessment: post-procedure vital signs reviewed and stable Respiratory status: spontaneous breathing and respiratory function stable Cardiovascular status: blood pressure returned to baseline and stable Postop Assessment: no headache and no apparent nausea or vomiting Anesthetic complications: no Comments: Late entry   No notable events documented.   Last Vitals:  Vitals:   02/16/24 1017 02/16/24 1129  BP: (!) 141/75 (!) 124/49  Pulse: 71 74  Resp: 10 11  Temp: 36.7 C 36.4 C  SpO2: 97% 96%    Last Pain:  Vitals:   02/16/24 1129  TempSrc: Oral  PainSc: 0-No pain                 Windell Norfolk

## 2024-02-16 NOTE — Anesthesia Procedure Notes (Signed)
 Date/Time: 02/16/2024 11:05 AM  Performed by: Franco Nones, CRNAPre-anesthesia Checklist: Patient identified, Emergency Drugs available, Suction available, Timeout performed and Patient being monitored Patient Re-evaluated:Patient Re-evaluated prior to induction Oxygen Delivery Method: Nasal cannula Comments: Optiflow

## 2024-02-19 ENCOUNTER — Encounter (HOSPITAL_COMMUNITY): Payer: Self-pay | Admitting: Gastroenterology

## 2024-02-19 ENCOUNTER — Other Ambulatory Visit (HOSPITAL_COMMUNITY): Payer: Self-pay

## 2024-02-19 LAB — SURGICAL PATHOLOGY

## 2024-02-22 ENCOUNTER — Encounter: Payer: Self-pay | Admitting: *Deleted

## 2024-02-22 NOTE — Progress Notes (Signed)
 Lab requisitions faxed to [facility]

## 2024-02-23 ENCOUNTER — Other Ambulatory Visit (HOSPITAL_COMMUNITY): Payer: Self-pay

## 2024-02-23 DIAGNOSIS — G4486 Cervicogenic headache: Secondary | ICD-10-CM | POA: Diagnosis not present

## 2024-02-23 DIAGNOSIS — G43709 Chronic migraine without aura, not intractable, without status migrainosus: Secondary | ICD-10-CM | POA: Diagnosis not present

## 2024-02-23 DIAGNOSIS — H02402 Unspecified ptosis of left eyelid: Secondary | ICD-10-CM | POA: Diagnosis not present

## 2024-02-23 DIAGNOSIS — G4733 Obstructive sleep apnea (adult) (pediatric): Secondary | ICD-10-CM | POA: Diagnosis not present

## 2024-02-23 MED ORDER — ACETAZOLAMIDE 250 MG PO TABS
ORAL_TABLET | ORAL | 3 refills | Status: DC
  Filled 2024-02-23: qty 180, 66d supply, fill #0
  Filled 2024-04-22: qty 180, 45d supply, fill #1
  Filled 2024-06-06: qty 180, 45d supply, fill #2
  Filled 2024-07-22: qty 180, 45d supply, fill #3

## 2024-02-26 ENCOUNTER — Other Ambulatory Visit: Payer: Self-pay

## 2024-02-26 ENCOUNTER — Encounter (INDEPENDENT_AMBULATORY_CARE_PROVIDER_SITE_OTHER): Payer: Self-pay | Admitting: *Deleted

## 2024-02-27 ENCOUNTER — Other Ambulatory Visit (HOSPITAL_COMMUNITY): Payer: Self-pay

## 2024-02-27 ENCOUNTER — Other Ambulatory Visit: Payer: Self-pay

## 2024-02-27 DIAGNOSIS — D801 Nonfamilial hypogammaglobulinemia: Secondary | ICD-10-CM | POA: Diagnosis not present

## 2024-02-28 ENCOUNTER — Other Ambulatory Visit: Payer: Self-pay

## 2024-02-29 ENCOUNTER — Other Ambulatory Visit: Payer: Self-pay

## 2024-02-29 ENCOUNTER — Other Ambulatory Visit (HOSPITAL_COMMUNITY): Payer: Self-pay

## 2024-03-04 ENCOUNTER — Other Ambulatory Visit (HOSPITAL_COMMUNITY): Payer: Self-pay

## 2024-03-05 ENCOUNTER — Other Ambulatory Visit (HOSPITAL_COMMUNITY): Payer: Self-pay

## 2024-03-05 ENCOUNTER — Encounter: Payer: Self-pay | Admitting: Nurse Practitioner

## 2024-03-05 ENCOUNTER — Encounter: Payer: Self-pay | Admitting: Physician Assistant

## 2024-03-05 DIAGNOSIS — D839 Common variable immunodeficiency, unspecified: Secondary | ICD-10-CM | POA: Diagnosis not present

## 2024-03-05 DIAGNOSIS — Z794 Long term (current) use of insulin: Secondary | ICD-10-CM | POA: Diagnosis not present

## 2024-03-05 DIAGNOSIS — E236 Other disorders of pituitary gland: Secondary | ICD-10-CM | POA: Diagnosis not present

## 2024-03-05 DIAGNOSIS — R5382 Chronic fatigue, unspecified: Secondary | ICD-10-CM | POA: Diagnosis not present

## 2024-03-05 DIAGNOSIS — D801 Nonfamilial hypogammaglobulinemia: Secondary | ICD-10-CM | POA: Diagnosis not present

## 2024-03-05 DIAGNOSIS — E1165 Type 2 diabetes mellitus with hyperglycemia: Secondary | ICD-10-CM | POA: Diagnosis not present

## 2024-03-05 DIAGNOSIS — R768 Other specified abnormal immunological findings in serum: Secondary | ICD-10-CM | POA: Diagnosis not present

## 2024-03-05 HISTORY — PX: TRIGGER FINGER RELEASE: SHX641

## 2024-03-06 ENCOUNTER — Encounter (HOSPITAL_COMMUNITY): Payer: Self-pay | Admitting: Hematology & Oncology

## 2024-03-06 LAB — COMPREHENSIVE METABOLIC PANEL WITH GFR
ALT: 37 IU/L — ABNORMAL HIGH (ref 0–32)
AST: 33 IU/L (ref 0–40)
Albumin: 4.8 g/dL (ref 3.8–4.9)
Alkaline Phosphatase: 84 IU/L (ref 44–121)
BUN/Creatinine Ratio: 17 (ref 9–23)
BUN: 19 mg/dL (ref 6–24)
Bilirubin Total: 0.6 mg/dL (ref 0.0–1.2)
CO2: 23 mmol/L (ref 20–29)
Calcium: 10.1 mg/dL (ref 8.7–10.2)
Chloride: 105 mmol/L (ref 96–106)
Creatinine, Ser: 1.13 mg/dL — ABNORMAL HIGH (ref 0.57–1.00)
Globulin, Total: 1.9 g/dL (ref 1.5–4.5)
Glucose: 102 mg/dL — ABNORMAL HIGH (ref 70–99)
Potassium: 4.7 mmol/L (ref 3.5–5.2)
Sodium: 142 mmol/L (ref 134–144)
Total Protein: 6.7 g/dL (ref 6.0–8.5)
eGFR: 57 mL/min/{1.73_m2} — ABNORMAL LOW (ref >59)

## 2024-03-06 LAB — TSH: TSH: 1.5 u[IU]/mL (ref 0.450–4.500)

## 2024-03-06 LAB — T4, FREE: Free T4: 1.14 ng/dL (ref 0.82–1.77)

## 2024-03-07 ENCOUNTER — Telehealth (INDEPENDENT_AMBULATORY_CARE_PROVIDER_SITE_OTHER): Admitting: Psychiatry

## 2024-03-07 ENCOUNTER — Encounter (HOSPITAL_COMMUNITY): Payer: Self-pay | Admitting: Psychiatry

## 2024-03-07 ENCOUNTER — Other Ambulatory Visit: Payer: Self-pay

## 2024-03-07 ENCOUNTER — Other Ambulatory Visit (HOSPITAL_COMMUNITY): Payer: Self-pay

## 2024-03-07 DIAGNOSIS — F319 Bipolar disorder, unspecified: Secondary | ICD-10-CM | POA: Diagnosis not present

## 2024-03-07 DIAGNOSIS — F332 Major depressive disorder, recurrent severe without psychotic features: Secondary | ICD-10-CM

## 2024-03-07 MED ORDER — LAMOTRIGINE 25 MG PO TABS
25.0000 mg | ORAL_TABLET | Freq: Two times a day (BID) | ORAL | 2 refills | Status: DC
  Filled 2024-03-07 – 2024-03-12 (×2): qty 60, 30d supply, fill #0
  Filled 2024-03-27: qty 60, 30d supply, fill #1
  Filled ????-??-??: fill #1

## 2024-03-07 MED ORDER — FLUOXETINE HCL 40 MG PO CAPS
40.0000 mg | ORAL_CAPSULE | Freq: Every day | ORAL | 2 refills | Status: DC
  Filled 2024-03-07 – 2024-03-12 (×2): qty 30, 30d supply, fill #0
  Filled 2024-03-27: qty 30, 30d supply, fill #1
  Filled ????-??-??: fill #1

## 2024-03-07 MED ORDER — CLONAZEPAM 1 MG PO TABS
1.0000 mg | ORAL_TABLET | Freq: Every day | ORAL | 2 refills | Status: DC
  Filled 2024-03-07: qty 30, 30d supply, fill #0

## 2024-03-07 MED ORDER — BUPROPION HCL ER (XL) 150 MG PO TB24
150.0000 mg | ORAL_TABLET | Freq: Every morning | ORAL | 2 refills | Status: DC
  Filled 2024-03-07 – 2024-03-12 (×2): qty 30, 30d supply, fill #0
  Filled 2024-03-27: qty 30, 30d supply, fill #1
  Filled ????-??-??: fill #1

## 2024-03-07 NOTE — Progress Notes (Signed)
 Virtual Visit via Video Note  I connected with Michelle Clements on 03/07/24 at  2:00 PM EDT by a video enabled telemedicine application and verified that I am speaking with the correct person using two identifiers.  Location: Patient: home Provider: office   I discussed the limitations of evaluation and management by telemedicine and the availability of in person appointments. The patient expressed understanding and agreed to proceed.      I discussed the assessment and treatment plan with the patient. The patient was provided an opportunity to ask questions and all were answered. The patient agreed with the plan and demonstrated an understanding of the instructions.   The patient was advised to call back or seek an in-person evaluation if the symptoms worsen or if the condition fails to improve as anticipated.  I provided 20 minutes of non-face-to-face time during this encounter.   Diannia Ruder, MD  [facility] MD/PA/NP OP Progress Note  03/07/2024 2:35 PM Michelle Clements  MRN:  [id number]  Chief Complaint:  Chief Complaint  Patient presents with   Anxiety   Depression   Follow-up   HPI: This patient is a 58 year old married white female who lives with her husband in [place]. She has 2 grown daughters. 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   The patient returns for follow-up after 4 months regarding her depression and manic symptoms and anxiety.  She states recently she has been feeling down and depressed for a couple weeks at a time.  She often feels "useless" as if people would miss her if she is gone.  However she does not have any active suicidal ideation.  She is still seeing a therapist.  She is still helping out with her grandson but her older granddaughter is helping with this as well as she cannot lift him.  The patient states that she feels groggy and off-balance.  Her pharmacist asked her to try to cut down the trazodone and I explained that she could try 25 mg for a few  nights and then stop it.  I offered to switch to Belsomra but she would like to try without any sedating medicines at bedtime.  She also takes tizanidine and clonazepam and I think these are probably more contributing to grogginess than anything else.  I urged her not to take these together.  She would like to cut down on her clonazepam only to 1 mg at bedtime and I think this is reasonable.  I also [facility] or doing water aerobics or just get into something where she can be around people and she states that she will try. Visit Diagnosis:    ICD-10-CM   1. Bipolar 1 disorder (HCC)  F31.9     2. 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 She was also admitted for detox several years ag   Past Medical History:  Past Medical History:  Diagnosis Date   Allergic rhinitis    Anemia    Arthritis    Lower back and hips   Asthma    Bipolar disorder (HCC)    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HCC)    Constipation    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HCC)    Depression    Essential hypertension, benign    Fibromyalgia  GERD (gastroesophageal reflux disease)    H/O sleep apnea    Hip pain, left    History of palpitations    Negative Holter monitor   History of pneumonia 1988   Hyperlipidemia    IBS (irritable bowel syndrome)    Nausea and vomiting 02/16/2024   Neck pain    Obstructive sleep apnea (adult) (pediatric) 04/12/2021   Poor short term memory    PTSD (post-traumatic stress disorder)    S/P Botox injection 11/2014    For migraine headaches   Type 2 diabetes mellitus (HCC)    Urgency of urination     Past Surgical History:  Procedure Laterality Date   CARPAL TUNNEL RELEASE Right 06/10/2013   Procedure: CARPAL TUNNEL RELEASE;  Surgeon: Reinaldo Meeker, MD;  Location: [facility];   Service: Neurosurgery;  Laterality: Right;  Right Carpal Tunnel Release    CHOLECYSTECTOMY     DENTAL SURGERY  12/2014   ESOPHAGOGASTRODUODENOSCOPY (EGD) WITH PROPOFOL N/A 02/16/2024   Procedure: ESOPHAGOGASTRODUODENOSCOPY (EGD) WITH PROPOFOL;  Surgeon: Dolores Frame, MD;  Location: [facility];  Service: Gastroenterology;  Laterality: N/A;  11:30am;ASA 1   mole exc     x 3   MUSCLE BIOPSY  2008   Right leg   POLYPECTOMY  02/16/2024   Procedure: POLYPECTOMY;  Surgeon: Marguerita Merles, Reuel Boom, MD;  Location: [facility];  Service: Gastroenterology;;   SAVORY DILATION  02/16/2024   Procedure: EGD, WITH DILATION USING SAVARY-GILLIARD DILATOR OVER GUIDEWIRE;  Surgeon: Marguerita Merles, Reuel Boom, MD;  Location: [facility];  Service: Gastroenterology;;   TUBAL LIGATION      Family Psychiatric History: See below  Family History:  Family History  Problem Relation Age of Onset   Fibromyalgia Mother    Diabetes type II Mother    Depression Mother    Alzheimer's disease Mother    GER disease Mother    Heart disease Mother    Paranoid behavior Mother    Dementia Mother    Heart attack Father 2       MI x5   Stroke Father        CVA x7   Asthma Father    Brain cancer Father    Seizures Father    Anxiety disorder Sister    OCD Sister    Sexual abuse Sister    Physical abuse Sister    Anxiety disorder Sister    ADD / ADHD Daughter    ADD / ADHD Daughter    Alcohol abuse Neg Hx    Drug abuse Neg Hx    Schizophrenia Neg Hx     Social History:  Social History   Socioeconomic History   Marital status: Married    Spouse name: Not on file   Number of children: 2   Years of education: College   Highest education level: Associate degree: occupational, Scientist, product/process development, or vocational program  Occupational History   Occupation: Chiropractor: UNEMPLOYED    Comment: Now disabled due to bipolar and fibromyalgia  Tobacco Use   Smoking status: Former    Current  packs/day: 0.00    Types: Cigarettes    Quit date: 11/04/2010    Years since quitting: 13.3    Passive exposure: Past   Smokeless tobacco: Never  Vaping Use   Vaping status: Never Used  Substance and Sexual Activity   Alcohol use: Yes    Comment: one mixed drink per month   Drug use: No  Sexual activity: Yes    Birth control/protection: Surgical  Other Topics Concern   Not on file  Social History Narrative   Married   Lives with spouse and daughter   Right handed.   Caffeine use: 2 cups coffee in morning and 2 cups tea during the day    Social Drivers of Health   Financial Resource Strain: Medium Risk (12/05/2023)   Overall Financial Resource Strain (CARDIA)    Difficulty of Paying Living Expenses: Somewhat hard  Food Insecurity: No Food Insecurity (12/05/2023)   Hunger Vital Sign    Worried About Running Out of Food in the Last Year: Never true    Ran Out of Food in the Last Year: Never true  Transportation Needs: No Transportation Needs (12/05/2023)   PRAPARE - Administrator, Civil Service (Medical): No    Lack of Transportation (Non-Medical): No  Physical Activity: Unknown (12/05/2023)   Exercise Vital Sign    Days of Exercise per Week: 0 days    Minutes of Exercise per Session: Not on file  Stress: Stress Concern Present (12/05/2023)   [employer] of Occupational Health - Occupational Stress Questionnaire    Feeling of Stress : To some extent  Social Connections: Unknown (12/05/2023)   Social Connection and Isolation Panel [NHANES]    Frequency of Communication with Friends and Family: Twice a week    Frequency of Social Gatherings with Friends and Family: Patient declined    Attends Religious Services: More than 4 times per year    Active Member of [employer] or Organizations: Yes    Attends Engineer, structural: More than 4 times per year    Marital Status: Married    Allergies:  Allergies  Allergen Reactions   Dust Mite Extract  Cough, Other (See Comments) and Shortness Of Breath    Wheezing  Other Reaction(s): Cough, Other (See Comments)  Other reaction(s): Wheezing    Wheezing    Wheezing Other reaction(s): Wheezing Wheezing Wheezing Other reaction(s): Wheezing Wheezing Wheezing Other reaction(s): Wheezing Wheezing Wheezing Other reaction(s): Wheezing Wheezing Wheezing Other reaction(s): Wheezing Wheezing Wheezing Other reaction(s): Wheezing Wheezing Wheezing Other reaction(s): Wheezing Wheezing    Wheezing, Other reaction(s): Wheezing, Wheezing, Wheezing, Other reaction(s): Wheezing, Wheezing, Wheezing, Other reaction(s): Wheezing, Wheezing, Wheezing, Other reaction(s): Wheezing, Wheezing, Wheezing, Other reaction(s): Wheezing, Wheezing, Wheezing, Other reaction(s): Wheezing, Wheezing, Wheezing, Other reaction(s): Wheezing, Wheezing   Molds & Smuts Shortness Of Breath    wheezing  wheezing wheezing wheezing    wheezing, wheezing, wheezing   Sulfa Antibiotics Anaphylaxis     : Angioedema    Trichophyton Shortness Of Breath    wheezing  wheezing    wheezing wheezing wheezing wheezing wheezing wheezing wheezing wheezing wheezing wheezing wheezing wheezing wheezing wheezing wheezing wheezing wheezing    wheezing, wheezing, wheezing, wheezing, wheezing, wheezing, wheezing, wheezing, wheezing, wheezing, wheezing, wheezing, wheezing, wheezing, wheezing, wheezing, wheezing   Carbamazepine Rash    Other Reaction(s): Other (See Comments)   Gluten Meal Itching   Quetiapine Fumarate Other (See Comments)    Metabolic Disorder Labs: Lab Results  Component Value Date   HGBA1C 6.2 (A) 11/07/2023   MPG 131 03/18/2015   No results found for: "PROLACTIN" Lab Results  Component Value Date   CHOL 165 12/05/2023   TRIG 165 (H) 12/05/2023   HDL 49 12/05/2023   CHOLHDL 3.4 12/05/2023   VLDL 44 (H) 03/18/2015   LDLCALC 88 12/05/2023   LDLCALC 126 03/31/2022   Lab Results  Component Value Date  TSH 1.500  03/05/2024   TSH 1.030 10/27/2023    Therapeutic Level Labs: Lab Results  Component Value Date   LITHIUM 0.20 (L) 02/21/2012   No results found for: "VALPROATE" No results found for: "CBMZ"  Current Medications: Current Outpatient Medications  Medication Sig Dispense Refill   clonazePAM (KLONOPIN) 1 MG tablet Take 1 tablet (1 mg total) by mouth at bedtime. 30 tablet 2   acetaZOLAMIDE (DIAMOX) 250 MG tablet Take 1 tablet (250 mg total) by mouth daily for 14 days, THEN 1 tablet (250 mg total) 2 (two) times daily for 14 days, THEN 1 tablet (250 mg) in the morning and 2 tablets (500 mg) in the evening for 14 days, THEN 2 tablets (500 mg total) 2 (two) times daily. 180 tablet 3   albuterol (VENTOLIN HFA) 108 (90 Base) MCG/ACT inhaler Inhale 2 puffs into the lungs every 6 (six) hours as needed for wheezing and/or shortness of breath. 6.7 g 5   aspirin EC 81 MG tablet Take 1 tablet every day by oral route.     azelastine (ASTELIN) 0.1 % nasal spray Place 1 spray into both nostrils 2 (two) times daily. 30 mL 2   azithromycin (ZITHROMAX Z-PAK) 250 MG tablet Take 2 tablets (500 mg) PO today, then 1 tablet (250 mg) PO daily x4 days. 6 tablet 0   benzonatate (TESSALON) 100 MG capsule Take 1 capsule (100 mg total) by mouth 2 (two) times daily as needed for cough. 20 capsule 0   Blood Glucose Monitoring Suppl (ONETOUCH VERIO FLEX SYSTEM) w/Device KIT Use as directed to check blood glucose twice daily, before breakfast and before bed. 1 kit 0   Blood Glucose Monitoring Suppl (ONETOUCH VERIO) w/Device KIT Use to check glucose once daily 1 kit 0   buPROPion (WELLBUTRIN XL) 150 MG 24 hr tablet Take 1 tablet (150 mg total) by mouth every morning. 30 tablet 2   cetirizine (ZYRTEC) 10 MG tablet Take 10 mg by mouth at bedtime.     Coenzyme Q10 (COQ10 PO) Take by mouth. One daily     Continuous Glucose Sensor (FREESTYLE LIBRE 3 SENSOR) MISC Apply new sensor every 14 days to monitor blood glucose. Remove old  sensor before applying new one. 6 each 3   cycloSPORINE (RESTASIS) 0.05 % ophthalmic emulsion Place 1 drop into both eyes 2 (two) times daily. 180 each 99   estradiol (ESTRACE) 0.1 MG/GM vaginal cream Place 1 Applicatorful vaginally 3 (three) times a week. 42.5 g 1   ferrous sulfate 325 (65 FE) MG EC tablet Take 325 mg by mouth 3 (three) times daily with meals. Slow FE     FLUoxetine (PROZAC) 40 MG capsule Take 1 capsule (40 mg total) by mouth daily. 30 capsule 2   fluticasone (FLONASE) 50 MCG/ACT nasal spray Place 1 spray into both nostrils daily.     Galcanezumab-gnlm (EMGALITY) 120 MG/ML SOSY Inject 1 mL into the skin every 30 (thirty) days. 1 mL 11   Glucos-Chond-Hyal Ac-Ca Fructo (MOVE FREE JOINT HEALTH ADVANCE PO) Take by mouth.     Immune Globulin, Human,-klhw (XEMBIFY) 10 GM/50ML SOLN Inject 10 g into the skin every 7 (seven) days. 200 mL 3   insulin glargine (LANTUS) 100 UNIT/ML Solostar Pen Inject 50 Units into the skin at bedtime. 45 mL 1   ipratropium-albuterol (DUONEB) 0.5-2.5 (3) MG/3ML SOLN Take 3 mLs by nebulization 2 (two) times daily as needed. 90 mL 2   lamoTRIgine (LAMICTAL) 25 MG tablet Take 1 tablet (25  mg total) by mouth 2 (two) times daily. 60 tablet 2   Lancets (ONETOUCH ULTRASOFT) lancets Use to check blood sugar once daily 100 each 3   Latanoprostene Bunod (VYZULTA) 0.024 % SOLN Place 1 drop into both eyes at bedtime as directed 7.5 mL 3   lidocaine (LIDODERM) 5 % Place 1 patch onto the skin daily, may wear up to 12 hours 30 patch 5   lubiprostone (AMITIZA) 24 MCG capsule Take 1 capsule (24 mcg total) by mouth 2 (two) times daily. 60 capsule 3   Melatonin 10 MG TABS Take 1 tablet every day by oral route at bedtime.     Multiple Vitamin (MULTIVITAMIN) tablet Take 1 tablet by mouth daily.     omeprazole (PRILOSEC) 20 MG capsule Take 1 capsule (20 mg total) by mouth 2 (two) times daily. 60 capsule 2   ondansetron (ZOFRAN-ODT) 8 MG disintegrating tablet Dissolve 1 tablet (8  mg total) by mouth every 8 (eight) hours as needed for nausea or vomiting. 20 tablet 6   ONETOUCH VERIO test strip Use to check glucose once daily. 100 each 3   OVER THE COUNTER MEDICATION Magnessium 500 mg at night     OVER THE COUNTER MEDICATION Digestive enzymes one at night     pregabalin (LYRICA) 100 MG capsule Take 2 capsules (200 mg total) by mouth 2 (two) times daily. 120 capsule 0   pregabalin (LYRICA) 200 MG capsule Take 1 capsule (200 mg total) by mouth 3 (three) times daily. 90 capsule 2   rosuvastatin (CRESTOR) 40 MG tablet Take 1 tablet (40 mg total) by mouth at bedtime. 90 tablet 1   sennosides-docusate sodium (SENOKOT-S) 8.6-50 MG tablet Take 2 tablets by mouth at bedtime.     tiZANidine (ZANAFLEX) 4 MG tablet Take 4 mg by mouth daily. 4mg  at bedtime and 2 mg during the day     tiZANidine (ZANAFLEX) 4 MG tablet Take 1 tablet (4 mg total) by mouth every 12 (twelve) hours as needed. 60 tablet 11   UNABLE TO FIND 1 each by Does not apply route daily. Med Name: CPAP SUPPLIES Full face mask Cushion for mask -Medium ResMed 1 each 11   VITAMIN D PO Take 5,000 Units by mouth daily.     zinc gluconate 50 MG tablet Take 50 mg by mouth daily.     No current facility-administered medications for this visit.     Musculoskeletal: Strength & Muscle Tone: decreased Gait & Station: unsteady Patient leans: N/A  Psychiatric Specialty Exam: Review of Systems  Constitutional:  Positive for fatigue.  Musculoskeletal:  Positive for arthralgias and gait problem.  Neurological:  Positive for weakness.  Psychiatric/Behavioral:  Positive for dysphoric mood.   All other systems reviewed and are negative.   Last menstrual period 01/04/2018.There is no height or weight on file to calculate BMI.  General Appearance: Casual and Fairly Groomed  Eye Contact:  Good  Speech:  Clear and Coherent  Volume:  Normal  Mood:  Dysphoric  Affect:  Congruent  Thought Process:  Goal Directed  Orientation:   Full (Time, Place, and Person)  Thought Content: Rumination   Suicidal Thoughts:  No  Homicidal Thoughts:  No  Memory:  Immediate;   Good Recent;   Good Remote;   NA  Judgement:  Good  Insight:  Fair  Psychomotor Activity:  Decreased  Concentration:  Concentration: Good and Attention Span: Good  Recall:  Good  Fund of Knowledge: Good  Language: Good  Akathisia:  No  Handed:  Right  AIMS (if indicated): not done  Assets:  Communication Skills Desire for Improvement Resilience Social Support Talents/Skills  ADL's:  Intact  Cognition: WNL  Sleep:  Good   Screenings: GAD-7    Flowsheet Row Telemedicine from 01/25/2024 in [facility] Office Visit from 12/05/2023 in [facility]  Total GAD-7 Score 10 10      Mini-Mental    Flowsheet Row Office Visit from 08/29/2019 in [facility]  Total Score (max 30 points ) 27      PHQ2-9    Flowsheet Row Telemedicine from 01/25/2024 in [facility] Office Visit from 12/05/2023 in [facility] Video Visit from 11/04/2022 in [facility] at [place] Video Visit from 08/23/2022 in [facility] at [place] Video Visit from 06/20/2022 in [facility] at [facility] Total Score 2 2 2  0 0  PHQ-9 Total Score 13 13 10  -- --      Flowsheet Row Admission (Discharged) from 02/16/2024 in [facility] Video Visit from 11/04/2022 in [facility] at [place] Video Visit from 08/23/2022 in [facility]patient Behavioral Health at [place]  C-SSRS RISK CATEGORY No Risk Error: Question 6 not populated No Risk        Assessment and Plan: This patient is a 58 year old female with a history of PTSD, possible bipolar disorder, depression anxiety and hallucinations in the past.  She also has chronic fatigue  and an immune deficiency disorder.  She would like to get on lower doses of sedating medicines so we will just increase trazodone to 25 mg at bedtime for a few days and then stop it.  She will cut clonazepam down to 1 mg at bedtime only for anxiety.  She will continue Lamictal 25 mg twice daily for mood swings, Wellbutrin XL 150 mg daily as well as Prozac 40 mg daily for depression.  She will return to see me in 3 months  Collaboration of Care: Collaboration of Care: Primary Care Provider AEB notes to be shared with PCP at patient's request  Patient/Guardian was advised Release of Information must be obtained prior to any record release in order to collaborate their care with an outside provider. Patient/Guardian was advised if they have not already done so to contact the registration department to sign all necessary forms in order for [place] to release information regarding their care.   Consent: Patient/Guardian gives verbal consent for treatment and assignment of benefits for services provided during this visit. Patient/Guardian expressed understanding and agreed to proceed.    Diannia Ruder, MD 03/07/2024, 2:35 PM

## 2024-03-08 ENCOUNTER — Ambulatory Visit (INDEPENDENT_AMBULATORY_CARE_PROVIDER_SITE_OTHER): Payer: Self-pay | Admitting: Internal Medicine

## 2024-03-08 ENCOUNTER — Other Ambulatory Visit (HOSPITAL_COMMUNITY): Payer: Self-pay

## 2024-03-08 ENCOUNTER — Ambulatory Visit: Payer: Self-pay | Admitting: Internal Medicine

## 2024-03-08 ENCOUNTER — Encounter: Payer: Self-pay | Admitting: Internal Medicine

## 2024-03-08 ENCOUNTER — Ambulatory Visit (INDEPENDENT_AMBULATORY_CARE_PROVIDER_SITE_OTHER): Payer: PPO | Admitting: Nurse Practitioner

## 2024-03-08 ENCOUNTER — Encounter: Payer: Self-pay | Admitting: Nurse Practitioner

## 2024-03-08 ENCOUNTER — Other Ambulatory Visit: Payer: Self-pay

## 2024-03-08 VITALS — BP 142/83 | HR 72 | Ht 71.0 in | Wt 231.6 lb

## 2024-03-08 VITALS — BP 138/88 | Ht 71.0 in | Wt 229.8 lb

## 2024-03-08 DIAGNOSIS — Z1231 Encounter for screening mammogram for malignant neoplasm of breast: Secondary | ICD-10-CM | POA: Diagnosis not present

## 2024-03-08 DIAGNOSIS — G4733 Obstructive sleep apnea (adult) (pediatric): Secondary | ICD-10-CM

## 2024-03-08 DIAGNOSIS — E236 Other disorders of pituitary gland: Secondary | ICD-10-CM

## 2024-03-08 DIAGNOSIS — R768 Other specified abnormal immunological findings in serum: Secondary | ICD-10-CM | POA: Diagnosis not present

## 2024-03-08 DIAGNOSIS — N1831 Chronic kidney disease, stage 3a: Secondary | ICD-10-CM | POA: Diagnosis not present

## 2024-03-08 DIAGNOSIS — G43709 Chronic migraine without aura, not intractable, without status migrainosus: Secondary | ICD-10-CM | POA: Diagnosis not present

## 2024-03-08 DIAGNOSIS — N1832 Chronic kidney disease, stage 3b: Secondary | ICD-10-CM

## 2024-03-08 DIAGNOSIS — F419 Anxiety disorder, unspecified: Secondary | ICD-10-CM

## 2024-03-08 DIAGNOSIS — E782 Mixed hyperlipidemia: Secondary | ICD-10-CM | POA: Diagnosis not present

## 2024-03-08 DIAGNOSIS — R0789 Other chest pain: Secondary | ICD-10-CM

## 2024-03-08 DIAGNOSIS — J4540 Moderate persistent asthma, uncomplicated: Secondary | ICD-10-CM | POA: Diagnosis not present

## 2024-03-08 DIAGNOSIS — E1165 Type 2 diabetes mellitus with hyperglycemia: Secondary | ICD-10-CM

## 2024-03-08 DIAGNOSIS — Z794 Long term (current) use of insulin: Secondary | ICD-10-CM

## 2024-03-08 DIAGNOSIS — R5382 Chronic fatigue, unspecified: Secondary | ICD-10-CM | POA: Diagnosis not present

## 2024-03-08 DIAGNOSIS — F32A Depression, unspecified: Secondary | ICD-10-CM | POA: Diagnosis not present

## 2024-03-08 DIAGNOSIS — G894 Chronic pain syndrome: Secondary | ICD-10-CM | POA: Diagnosis not present

## 2024-03-08 LAB — POCT GLYCOSYLATED HEMOGLOBIN (HGB A1C)

## 2024-03-08 LAB — BASIC METABOLIC PANEL WITH GFR: Glucose: 146

## 2024-03-08 MED ORDER — FREESTYLE LIBRE 3 PLUS SENSOR MISC
3 refills | Status: DC
  Filled 2024-03-08: qty 6, 90d supply, fill #0
  Filled 2024-05-28 – 2024-07-17 (×2): qty 6, 90d supply, fill #1
  Filled 2024-08-29 – 2024-09-26 (×4): qty 6, 90d supply, fill #2

## 2024-03-08 MED ORDER — INSULIN GLARGINE 100 UNIT/ML SOLOSTAR PEN
40.0000 [IU] | PEN_INJECTOR | Freq: Every day | SUBCUTANEOUS | 3 refills | Status: AC
  Filled 2024-03-08 – 2024-04-29 (×7): qty 36, 90d supply, fill #0
  Filled 2024-07-16: qty 36, 90d supply, fill #1
  Filled 2024-10-22: qty 36, 90d supply, fill #2

## 2024-03-08 MED ORDER — TRELEGY ELLIPTA 100-62.5-25 MCG/ACT IN AEPB
1.0000 | INHALATION_SPRAY | Freq: Every day | RESPIRATORY_TRACT | 11 refills | Status: DC
  Filled 2024-03-08: qty 60, 30d supply, fill #0
  Filled 2024-04-01: qty 60, 30d supply, fill #1

## 2024-03-08 NOTE — Assessment & Plan Note (Signed)
 Recently seen by neurology for follow-up.  She is currently prescribed Emgality, eletriptan, and Zofran.

## 2024-03-08 NOTE — Assessment & Plan Note (Signed)
 She endorses nightly compliance with CPAP, however also notes that her watch has noted decreased O2 sats overnight.  Will request CPAP compliance report

## 2024-03-08 NOTE — Progress Notes (Signed)
 Endocrinology Follow Up Visit       03/08/2024, 11:35 AM   Subjective:    Patient ID: Michelle Clements, female    DOB: 07-Jul-1966.  Michelle Clements is being seen in follow up after being seen in consultation for management of currently uncontrolled symptomatic diabetes requested by  Billie Lade, MD.   Past Medical History:  Diagnosis Date   Allergic rhinitis    Anemia    Arthritis    Lower back and hips   Asthma    Bipolar disorder (HCC)    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HCC)    Constipation    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HCC)    Depression    Essential hypertension, benign    Fibromyalgia    GERD (gastroesophageal reflux disease)    H/O sleep apnea    Hip pain, left    History of palpitations    Negative Holter monitor   History of pneumonia 1988   Hyperlipidemia    IBS (irritable bowel syndrome)    Nausea and vomiting 02/16/2024   Neck pain    Obstructive sleep apnea (adult) (pediatric) 04/12/2021   Poor short term memory    PTSD (post-traumatic stress disorder)    S/P Botox injection 11/2014    For migraine headaches   Type 2 diabetes mellitus (HCC)    Urgency of urination     Past Surgical History:  Procedure Laterality Date   CARPAL TUNNEL RELEASE Right 06/10/2013   Procedure: CARPAL TUNNEL RELEASE;  Surgeon: Reinaldo Meeker, MD;  Location: [facility];  Service: Neurosurgery;  Laterality: Right;  Right Carpal Tunnel Release    CHOLECYSTECTOMY     DENTAL SURGERY  12/2014   ESOPHAGOGASTRODUODENOSCOPY (EGD) WITH PROPOFOL N/A 02/16/2024   Procedure: ESOPHAGOGASTRODUODENOSCOPY (EGD) WITH PROPOFOL;  Surgeon: Dolores Frame, MD;  Location: [facility];  Service: Gastroenterology;  Laterality: N/A;  11:30am;ASA 1   mole exc     x 3   MUSCLE BIOPSY  2008   Right leg   POLYPECTOMY  02/16/2024   Procedure: POLYPECTOMY;  Surgeon: Dolores Frame, MD;  Location: [facility];  Service: Gastroenterology;;   SAVORY DILATION  02/16/2024   Procedure: EGD, WITH DILATION USING SAVARY-GILLIARD DILATOR OVER GUIDEWIRE;  Surgeon: Dolores Frame, MD;  Location: [facility];  Service: Gastroenterology;;   TUBAL LIGATION      Social History   Socioeconomic History   Marital status: Married    Spouse name: Not on file   Number of children: 2   Years of education: College   Highest education level: Associate degree: occupational, Scientist, product/process development, or vocational program  Occupational History   Occupation: Chiropractor: UNEMPLOYED    Comment: Now disabled due to bipolar and fibromyalgia  Tobacco Use   Smoking status: Former    Current packs/day: 0.00    Types: Cigarettes    Quit date: 11/04/2010    Years since quitting: 13.3    Passive exposure: Past   Smokeless tobacco: Never  Vaping Use   Vaping status: Never Used  Substance and Sexual Activity   Alcohol use: Yes    Comment: one mixed drink  per month   Drug use: No   Sexual activity: Yes    Birth control/protection: Surgical  Other Topics Concern   Not on file  Social History Narrative   Married   Lives with spouse and daughter   Right handed.   Caffeine use: 2 cups coffee in morning and 2 cups tea during the day    Social Drivers of Health   Financial Resource Strain: Medium Risk (12/05/2023)   Overall Financial Resource Strain (CARDIA)    Difficulty of Paying Living Expenses: Somewhat hard  Food Insecurity: No Food Insecurity (12/05/2023)   Hunger Vital Sign    Worried About Running Out of Food in the Last Year: Never true    Ran Out of Food in the Last Year: Never true  Transportation Needs: No Transportation Needs (12/05/2023)   PRAPARE - Administrator, Civil Service (Medical): No    Lack of Transportation (Non-Medical): No  Physical Activity: Unknown (12/05/2023)   Exercise Vital Sign    Days of Exercise per Week: 0 days    Minutes of Exercise per Session:  Not on file  Stress: Stress Concern Present (12/05/2023)   [employer] of Occupational Health - Occupational Stress Questionnaire    Feeling of Stress : To some extent  Social Connections: Unknown (12/05/2023)   Social Connection and Isolation Panel [NHANES]    Frequency of Communication with Friends and Family: Twice a week    Frequency of Social Gatherings with Friends and Family: Patient declined    Attends Religious Services: More than 4 times per year    Active Member of [employer] or Organizations: Yes    Attends Engineer, structural: More than 4 times per year    Marital Status: Married    Family History  Problem Relation Age of Onset   Fibromyalgia Mother    Diabetes type II Mother    Depression Mother    Alzheimer's disease Mother    GER disease Mother    Heart disease Mother    Paranoid behavior Mother    Dementia Mother    Heart attack Father 33       MI x5   Stroke Father        CVA x7   Asthma Father    Brain cancer Father    Seizures Father    Anxiety disorder Sister    OCD Sister    Sexual abuse Sister    Physical abuse Sister    Anxiety disorder Sister    ADD / ADHD Daughter    ADD / ADHD Daughter    Alcohol abuse Neg Hx    Drug abuse Neg Hx    Schizophrenia Neg Hx     Outpatient Encounter Medications as of 03/08/2024  Medication Sig   albuterol (VENTOLIN HFA) 108 (90 Base) MCG/ACT inhaler Inhale 2 puffs into the lungs every 6 (six) hours as needed for wheezing and/or shortness of breath.   aspirin EC 81 MG tablet Take 1 tablet every day by oral route.   azelastine (ASTELIN) 0.1 % nasal spray Place 1 spray into both nostrils 2 (two) times daily.   Blood Glucose Monitoring Suppl (ONETOUCH VERIO FLEX SYSTEM) w/Device KIT Use as directed to check blood glucose twice daily, before breakfast and before bed.   Blood Glucose Monitoring Suppl (ONETOUCH VERIO) w/Device KIT Use to check glucose once daily   buPROPion (WELLBUTRIN XL) 150 MG 24 hr tablet  Take 1 tablet (150 mg total) by mouth every morning.  cetirizine (ZYRTEC) 10 MG tablet Take 10 mg by mouth at bedtime.   clonazePAM (KLONOPIN) 1 MG tablet Take 1 tablet (1 mg total) by mouth at bedtime.   Coenzyme Q10 (COQ10 PO) Take by mouth. One daily   Continuous Glucose Sensor (FREESTYLE LIBRE 3 PLUS SENSOR) MISC Change sensor every 15 days.   Continuous Glucose Sensor (FREESTYLE LIBRE 3 SENSOR) MISC Apply new sensor every 14 days to monitor blood glucose. Remove old sensor before applying new one.   cycloSPORINE (RESTASIS) 0.05 % ophthalmic emulsion Place 1 drop into both eyes 2 (two) times daily.   estradiol (ESTRACE) 0.1 MG/GM vaginal cream Place 1 Applicatorful vaginally 3 (three) times a week.   FLUoxetine (PROZAC) 40 MG capsule Take 1 capsule (40 mg total) by mouth daily.   fluticasone (FLONASE) 50 MCG/ACT nasal spray Place 1 spray into both nostrils daily.   Galcanezumab-gnlm (EMGALITY) 120 MG/ML SOSY Inject 1 mL into the skin every 30 (thirty) days.   Glucos-Chond-Hyal Ac-Ca Fructo (MOVE FREE JOINT HEALTH ADVANCE PO) Take by mouth.   Immune Globulin, Human,-klhw (XEMBIFY) 10 GM/50ML SOLN Inject 10 g into the skin every 7 (seven) days.   ipratropium-albuterol (DUONEB) 0.5-2.5 (3) MG/3ML SOLN Take 3 mLs by nebulization 2 (two) times daily as needed.   lamoTRIgine (LAMICTAL) 25 MG tablet Take 1 tablet (25 mg total) by mouth 2 (two) times daily.   Lancets (ONETOUCH ULTRASOFT) lancets Use to check blood sugar once daily   Latanoprostene Bunod (VYZULTA) 0.024 % SOLN Place 1 drop into both eyes at bedtime as directed   lidocaine (LIDODERM) 5 % Place 1 patch onto the skin daily, may wear up to 12 hours   lubiprostone (AMITIZA) 24 MCG capsule Take 1 capsule (24 mcg total) by mouth 2 (two) times daily.   omeprazole (PRILOSEC) 20 MG capsule Take 1 capsule (20 mg total) by mouth 2 (two) times daily.   ondansetron (ZOFRAN-ODT) 8 MG disintegrating tablet Dissolve 1 tablet (8 mg total) by mouth  every 8 (eight) hours as needed for nausea or vomiting.   ONETOUCH VERIO test strip Use to check glucose once daily.   OVER THE COUNTER MEDICATION Magnessium 500 mg at night   pregabalin (LYRICA) 100 MG capsule Take 2 capsules (200 mg total) by mouth 2 (two) times daily.   rosuvastatin (CRESTOR) 40 MG tablet Take 1 tablet (40 mg total) by mouth at bedtime.   sennosides-docusate sodium (SENOKOT-S) 8.6-50 MG tablet Take 2 tablets by mouth at bedtime.   tiZANidine (ZANAFLEX) 4 MG tablet Take 4 mg by mouth daily. 4mg  at bedtime and 2 mg during the day   tiZANidine (ZANAFLEX) 4 MG tablet Take 1 tablet (4 mg total) by mouth every 12 (twelve) hours as needed.   UNABLE TO FIND 1 each by Does not apply route daily. Med Name: CPAP SUPPLIES Full face mask Cushion for mask -Medium ResMed   VITAMIN D PO Take 5,000 Units by mouth daily.   zinc gluconate 50 MG tablet Take 50 mg by mouth daily.   [DISCONTINUED] insulin glargine (LANTUS) 100 UNIT/ML Solostar Pen Inject 50 Units into the skin at bedtime.   acetaZOLAMIDE (DIAMOX) 250 MG tablet Take 1 tablet (250 mg total) by mouth daily for 14 days, THEN 1 tablet (250 mg total) 2 (two) times daily for 14 days, THEN 1 tablet (250 mg) in the morning and 2 tablets (500 mg) in the evening for 14 days, THEN 2 tablets (500 mg total) 2 (two) times daily. (Patient not taking:  Reported on 03/08/2024)   Fluticasone-Umeclidin-Vilant (TRELEGY ELLIPTA) 100-62.5-25 MCG/ACT AEPB Inhale 1 puff into the lungs daily. (Patient not taking: Reported on 03/08/2024)   insulin glargine (LANTUS) 100 UNIT/ML Solostar Pen Inject 40 Units into the skin at bedtime.   Melatonin 10 MG TABS Take 1 tablet every day by oral route at bedtime. (Patient not taking: Reported on 03/08/2024)   Multiple Vitamin (MULTIVITAMIN) tablet Take 1 tablet by mouth daily. (Patient not taking: Reported on 03/08/2024)   OVER THE COUNTER MEDICATION Digestive enzymes one at night (Patient not taking: Reported on 03/08/2024)    pregabalin (LYRICA) 200 MG capsule Take 1 capsule (200 mg total) by mouth 3 (three) times daily.   [DISCONTINUED] azithromycin (ZITHROMAX Z-PAK) 250 MG tablet Take 2 tablets (500 mg) PO today, then 1 tablet (250 mg) PO daily x4 days.   [DISCONTINUED] benzonatate (TESSALON) 100 MG capsule Take 1 capsule (100 mg total) by mouth 2 (two) times daily as needed for cough. (Patient not taking: Reported on 03/08/2024)   [DISCONTINUED] buPROPion (WELLBUTRIN XL) 150 MG 24 hr tablet Take 1 tablet (150 mg total) by mouth every morning.   [DISCONTINUED] clonazePAM (KLONOPIN) 0.5 MG tablet Take 1 tablet (0.5 mg total) by mouth every morning AND 2 tablets (1 mg total) at bedtime.   [DISCONTINUED] ferrous sulfate 325 (65 FE) MG EC tablet Take 325 mg by mouth 3 (three) times daily with meals. Slow FE (Patient not taking: Reported on 03/08/2024)   [DISCONTINUED] FLUoxetine (PROZAC) 40 MG capsule Take 1 capsule (40 mg total) by mouth daily.   [DISCONTINUED] hydrOXYzine (ATARAX) 10 MG tablet Take 1 tablet (10 mg total) by mouth 2 (two) times daily as needed.   [DISCONTINUED] lamoTRIgine (LAMICTAL) 25 MG tablet Take 1 tablet (25 mg total) by mouth 2 (two) times daily.   [DISCONTINUED] rOPINIRole (REQUIP) 0.25 MG tablet Take 1 tablet (0.25 mg total) by mouth 2 (two) times daily.   [DISCONTINUED] SUMAtriptan (IMITREX) 100 MG tablet Take 1 tablet by mouth as needed for migraine. If migraine continues, may repeat dose after 2 hours.   [DISCONTINUED] traZODone (DESYREL) 50 MG tablet Take 1 tablet (50 mg total) by mouth at bedtime.   No facility-administered encounter medications on file as of 03/08/2024.    ALLERGIES: Allergies  Allergen Reactions   Dust Mite Extract Cough, Other (See Comments) and Shortness Of Breath    Wheezing  Other Reaction(s): Cough, Other (See Comments)  Other reaction(s): Wheezing    Wheezing    Wheezing Other reaction(s): Wheezing Wheezing Wheezing Other reaction(s): Wheezing Wheezing Wheezing  Other reaction(s): Wheezing Wheezing Wheezing Other reaction(s): Wheezing Wheezing Wheezing Other reaction(s): Wheezing Wheezing Wheezing Other reaction(s): Wheezing Wheezing Wheezing Other reaction(s): Wheezing Wheezing    Wheezing, Other reaction(s): Wheezing, Wheezing, Wheezing, Other reaction(s): Wheezing, Wheezing, Wheezing, Other reaction(s): Wheezing, Wheezing, Wheezing, Other reaction(s): Wheezing, Wheezing, Wheezing, Other reaction(s): Wheezing, Wheezing, Wheezing, Other reaction(s): Wheezing, Wheezing, Wheezing, Other reaction(s): Wheezing, Wheezing   Molds & Smuts Shortness Of Breath    wheezing  wheezing wheezing wheezing    wheezing, wheezing, wheezing   Sulfa Antibiotics Anaphylaxis     : Angioedema    Trichophyton Shortness Of Breath    wheezing  wheezing    wheezing wheezing wheezing wheezing wheezing wheezing wheezing wheezing wheezing wheezing wheezing wheezing wheezing wheezing wheezing wheezing wheezing    wheezing, wheezing, wheezing, wheezing, wheezing, wheezing, wheezing, wheezing, wheezing, wheezing, wheezing, wheezing, wheezing, wheezing, wheezing, wheezing, wheezing   Carbamazepine Rash    Other Reaction(s): Other (See Comments)  Gluten Meal Itching   Quetiapine Fumarate Other (See Comments)    VACCINATION STATUS: Immunization History  Administered Date(s) Administered   Fluzone Influenza virus vaccine,trivalent (IIV3), split virus 09/24/2009   Influenza Whole 10/03/2007   Influenza,inj,Quad PF,6+ Mos 10/03/2018, 09/01/2021   Influenza,inj,quad, With Preservative 09/26/2019   Influenza-Unspecified 09/18/2014, 09/23/2015, 09/04/2017   Novel Infuenza-h1n1-09 01/26/2009   Pneumococcal Polysaccharide-23 10/14/2012   Td 01/21/2019    Diabetes She presents for her follow-up diabetic visit. She has type 2 diabetes mellitus. Onset time: She was diagnosed at approximate age of 72. Her disease course has been improving. There are no hypoglycemic associated  symptoms. Pertinent negatives for diabetes include no blurred vision, no fatigue, no polydipsia, no polyphagia and no polyuria. There are no hypoglycemic complications. Symptoms are stable. Diabetic complications include nephropathy and retinopathy. Risk factors for coronary artery disease include diabetes mellitus, dyslipidemia, obesity and sedentary lifestyle. Current diabetic treatment includes insulin injections. She is compliant with treatment all of the time. Her weight is fluctuating minimally. She is following a generally healthy diet. Meal planning includes avoidance of concentrated sweets. She has not had a previous visit with a dietitian. She participates in exercise intermittently. Her home blood glucose trend is decreasing steadily. Her breakfast blood glucose range is generally 70-90 mg/dl. Her overall blood glucose range is 130-140 mg/dl. (She presents today with her CGM and logs showing at tight glycemic profile.  Her POCT A1c today is 6.1%, improving from last visit of 6.2%.  Analysis of her CGM shows TIR 94%, TAR 5%, TBR 1%.  She notes she has been losing her balance a lot recently, saw her PCP this morning for this and also her neurologist but no one has come up with a definitive answer.) An ACE inhibitor/angiotensin II receptor blocker is not being taken. She does not see a podiatrist.Eye exam is current.  Hyperlipidemia This is a chronic problem. The current episode started more than 1 year ago. The problem is uncontrolled. Recent lipid tests were reviewed and are variable. Exacerbating diseases include chronic renal disease, diabetes and obesity. There are no known factors aggravating her hyperlipidemia. Current antihyperlipidemic treatment includes statins. The current treatment provides moderate improvement of lipids. There are no compliance problems.  Risk factors for coronary artery disease include diabetes mellitus, dyslipidemia, obesity and a sedentary lifestyle.    Review of  systems  Constitutional: + steadily decreasing body weight-unintentional,  current Body mass index is 32.05 kg/m. , no fatigue, no subjective hyperthermia, no subjective hypothermia Eyes: no blurry vision, no xerophthalmia ENT: no sore throat, no nodules palpated in throat, no dysphagia/odynophagia, no hoarseness Cardiovascular: no chest pain, no shortness of breath, no palpitations, no leg swelling Respiratory: no cough, no shortness of breath Gastrointestinal: no nausea/vomiting/diarrhea Musculoskeletal: no muscle/joint aches Skin: no rashes, no hyperemia Neurological: no tremors, no numbness, no tingling, no dizziness Psychiatric: no depression, no anxiety, hx bipolar d/o- controlled on meds   Objective:    BP 138/88 (BP Location: Left Arm, Patient Position: Sitting, Cuff Size: Large)   Ht 5\' 11"  (1.803 m)   Wt 229 lb 12.8 oz (104.2 kg)   LMP 01/04/2018   BMI 32.05 kg/m   Wt Readings from Last 3 Encounters:  03/08/24 229 lb 12.8 oz (104.2 kg)  03/08/24 231 lb 9.6 oz (105.1 kg)  02/16/24 229 lb (103.9 kg)    BP Readings from Last 3 Encounters:  03/08/24 138/88  03/08/24 (!) 142/83  02/16/24 (!) 124/49    Physical Exam- Limited  Constitutional:  Body mass index is 32.05 kg/m. , not in acute distress, normal state of mind Eyes:  EOMI, no exophthalmos Musculoskeletal: no gross deformities, strength intact in all four extremities, no gross restriction of joint movements Skin:  no rashes, no hyperemia Neurological: no tremor with outstretched hands   Diabetic Foot Exam - Simple   Simple Foot Form Diabetic Foot exam was performed with the following findings: Yes 03/08/2024 11:13 AM  Visual Inspection No deformities, no ulcerations, no other skin breakdown bilaterally: Yes Sensation Testing Intact to touch and monofilament testing bilaterally: Yes Pulse Check Posterior Tibialis and Dorsalis pulse intact bilaterally: Yes Comments    CMP ( most recent) CMP      Component Value Date/Time   NA 142 03/05/2024 1537   K 4.7 03/05/2024 1537   CL 105 03/05/2024 1537   CO2 23 03/05/2024 1537   GLUCOSE 102 (H) 03/05/2024 1537   GLUCOSE 108 (H) 10/02/2023 0948   BUN 19 03/05/2024 1537   CREATININE 1.13 (H) 03/05/2024 1537   CREATININE 0.88 02/21/2012 1153   CALCIUM 10.1 03/05/2024 1537   PROT 6.7 03/05/2024 1537   ALBUMIN 4.8 03/05/2024 1537   AST 33 03/05/2024 1537   ALT 37 (H) 03/05/2024 1537   ALKPHOS 84 03/05/2024 1537   BILITOT 0.6 03/05/2024 1537   GFRNONAA 59 (L) 10/02/2023 0948   GFRAA 50 12/29/2020 0000     Diabetic Labs (most recent): Lab Results  Component Value Date   HGBA1C 6.2 (A) 11/07/2023   HGBA1C 6.5 (A) 07/05/2023   HGBA1C 5.7 (A) 08/11/2022   MICROALBUR 26 03/31/2022   MICROALBUR 150 10/22/2020     Lipid Panel ( most recent) Lipid Panel     Component Value Date/Time   CHOL 165 12/05/2023 1058   TRIG 165 (H) 12/05/2023 1058   HDL 49 12/05/2023 1058   CHOLHDL 3.4 12/05/2023 1058   CHOLHDL 4.2 03/18/2015 0915   VLDL 44 (H) 03/18/2015 0915   LDLCALC 88 12/05/2023 1058   LABVLDL 28 12/05/2023 1058      Lab Results  Component Value Date   TSH 1.500 03/05/2024   TSH 1.030 10/27/2023   TSH 1.920 02/03/2023   TSH 2.69 12/29/2020   TSH 1.66 09/14/2020   TSH 2.770 08/29/2019   TSH 3.261 10/14/2012   TSH 2.651 02/21/2012   TSH 1.981 03/06/2009   TSH 3.250 01/23/2008   FREET4 1.14 03/05/2024   FREET4 1.14 10/27/2023   FREET4 1.05 02/03/2023           Assessment & Plan:   1) Type 2 diabetes mellitus with hyperglycemia, with long-term current use of insulin (HCC)  - Michelle Clements has currently uncontrolled symptomatic type 2 DM since 58 years of age.  She presents today with her CGM and logs showing at tight glycemic profile.  Her POCT A1c today is 6.1%, improving from last visit of 6.2%.  Analysis of her CGM shows TIR 94%, TAR 5%, TBR 1%.  She notes she has been losing her balance a lot recently, saw  her PCP this morning for this and also her neurologist but no one has come up with a definitive answer.   Recent labs reviewed, showing stable CKD stage 3.  Will put in referral to nephrology.  - I had a long discussion with her about the progressive nature of diabetes and the pathology behind its complications. -her diabetes is complicated by sleep apnea, HLD, retinopathy and she remains at a high risk for more acute and chronic  complications which include CAD, CVA, CKD, retinopathy, and neuropathy. These are all discussed in detail with her.  - Nutritional counseling repeated at each appointment due to patients tendency to fall back in to old habits.  - The patient admits there is a room for improvement in their diet and drink choices. -  Suggestion is made for the patient to avoid simple carbohydrates from their diet including Cakes, Sweet Desserts / Pastries, Ice Cream, Soda (diet and regular), Sweet Tea, Candies, Chips, Cookies, Sweet Pastries, Store Bought Juices, Alcohol in Excess of 1-2 drinks a day, Artificial Sweeteners, Coffee Creamer, and "Sugar-free" Products. This will help patient to have stable blood glucose profile and potentially avoid unintended weight gain.   - I encouraged the patient to switch to unprocessed or minimally processed complex starch and increased protein intake (animal or plant source), fruits, and vegetables.   - Patient is advised to stick to a routine mealtimes to eat 3 meals a day and avoid unnecessary snacks (to snack only to correct hypoglycemia).  - she is following with Norm Salt, RDE for diabetes education.  - I have approached her with the following individualized plan to manage  her diabetes and patient agrees:   -She is advised to lower her Lantus to 40 units SQ nightly.   -She is encouraged to continue monitoring blood glucose twice daily, before breakfast and before bed, and to call the clinic if she has readings less than 70 or greater than  200 for 3 tests in a row.  - Specific targets for  A1c;  LDL, HDL,  and Triglycerides were discussed with the patient.  2) Blood Pressure /Hypertension:   her blood pressure is controlled to target without the use of antihypertensive medications.  She is advised to continue Lisinopril 5 mg po daily with breakfast.   3) Lipids/Hyperlipidemia:    Her most recent lipid panel from 07/25/23 shows controlled LDL of 87.  She is advised to continue her Crestor 20 mg po daily at bedtime.  Side effects and precautions discussed with her.  4) Weight/Diet:  Her Body mass index is 32.05 kg/m.  -  clearly complicating her diabetes care.  She has lost approx 11 lbs and is advised to keep it up!  she is a candidate for weight loss. I discussed with her the fact that loss of 5 - 10% of her  current body weight will have the most impact on her diabetes management.  Exercise, and detailed carbohydrates information provided  -  detailed on discharge instructions.  5) Vitamin D deficiency Her most recent vitamin D level was 73.7 on 10/27/23.  She is currently on OTC vitamin D3 2000 units daily.  She is advised to continue supplementation at this time, especially during the winter months.  6) Chronic Care/Health Maintenance: -she is on Statin medications and is encouraged to initiate and continue to follow up with Ophthalmology, Dentist,  Podiatrist at least yearly or according to recommendations, and advised to stay away from smoking. I have recommended yearly flu vaccine and pneumonia vaccine at least every 5 years; moderate intensity exercise for up to 150 minutes weekly; and  sleep for at least 7 hours a day.  7) Positive thyroid antibodies Thyroglobulin antibodies were high at 10.1 on 02/03/23 indicating genetically predisposed to developing a thyroid issue in the future.  And her thyroid is consistent with euthyroid presentation at this time.  She does not need antithyroid treatment or thyroid hormone replacement  at this time.  Her repeat labs still show euthyroid presentation.   8) Empty sella Recent imaging of the brain showed incidental empty sella.  Will add Prolactin level to assess further.  Will transfer care to Dr. Fransico Him if this comes back abnormal.    - she is advised to maintain close follow up with Durwin Nora, Lucina Mellow, MD for primary care needs, as well as her other providers for optimal and coordinated care.      I spent  51  minutes in the care of the patient today including review of labs from CMP, Lipids, Thyroid Function, Hematology (current and previous including abstractions from other facilities); face-to-face time discussing  her blood glucose readings/logs, discussing hypoglycemia and hyperglycemia episodes and symptoms, medications doses, her options of short and long term treatment based on the latest standards of care / guidelines;  discussion about incorporating lifestyle medicine;  and documenting the encounter. Risk reduction counseling performed per USPSTF guidelines to reduce obesity and cardiovascular risk factors.     Please refer to Patient Instructions for Blood Glucose Monitoring and Insulin/Medications Dosing Guide"  in media tab for additional information. Please  also refer to " Patient Self Inventory" in the Media  tab for reviewed elements of pertinent patient history.  Michelle Clements participated in the discussions, expressed understanding, and voiced agreement with the above plans.  All questions were answered to her satisfaction. she is encouraged to contact clinic should she have any questions or concerns prior to her return visit.   Follow up plan: - Return in about 4 months (around 07/08/2024) for Diabetes F/U with A1c in office, No previsit labs, Bring meter and logs.  Ronny Bacon,  [facility] [address] [place], [place] [address] Phone: [phone] Fax: [phone]  03/08/2024, 11:35 AM

## 2024-03-08 NOTE — Patient Instructions (Signed)
 It was a pleasure to see you today.  Thank you for giving [place] the opportunity to be involved in your care.  Below is a brief recap of your visit and next steps.  We will plan to see you again in 3 months.  Summary Add Trelegy for asthma maintenance Mammogram ordered Follow up in 3 months

## 2024-03-08 NOTE — Assessment & Plan Note (Signed)
 Renal function stable on labs from 4/1.

## 2024-03-08 NOTE — Progress Notes (Signed)
 Established Patient Office Visit  Subjective   Patient ID: Michelle Clements, female    DOB: 15-Jul-1966  Age: 58 y.o. MRN: [id number]  Chief Complaint  Patient presents with   Care Management    Follow up   Michelle Clements returns to care today for routine follow-up.  She was last evaluated by me for an acute visit through video encounter in the setting of URI symptoms on 2/20.  Treated with Z-Pak and prednisone for suspected asthma exacerbation.  Previously seen by me in late December 2024 as a new patient presenting to establish care.  No medication changes were made at that time and 55-month follow-up was arranged.  In the interim, she has been evaluated by gastroenterology, oncology, and psychiatry.  She underwent EGD with Dr. Levon Hedger on 3/14 in the setting of dysphagia.  No acute findings were identified.  Pathology revealed a hyperplastic gastric polyp.  Repeat upper endoscopy recommended for 2 years.  There have otherwise been no acute interval events.  Today she reports feeling fairly well.  She endorses chronic right chest wall pain and a persistent, productive cough x 3 months.  Past Medical History:  Diagnosis Date   Allergic rhinitis    Anemia    Arthritis    Lower back and hips   Asthma    Bipolar disorder (HCC)    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HCC)    Constipation    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HCC)    Depression    Essential hypertension, benign    Fibromyalgia    GERD (gastroesophageal reflux disease)    H/O sleep apnea    Hip pain, left    History of palpitations    Negative Holter monitor   History of pneumonia 1988   Hyperlipidemia    IBS (irritable bowel syndrome)    Nausea and vomiting 02/16/2024   Neck pain    Obstructive sleep apnea (adult) (pediatric) 04/12/2021   Poor short term memory    PTSD (post-traumatic stress disorder)    S/P Botox injection 11/2014    For migraine headaches   Type 2 diabetes mellitus (HCC)    Urgency of urination    Past  Surgical History:  Procedure Laterality Date   CARPAL TUNNEL RELEASE Right 06/10/2013   Procedure: CARPAL TUNNEL RELEASE;  Surgeon: Reinaldo Meeker, MD;  Location: [facility];  Service: Neurosurgery;  Laterality: Right;  Right Carpal Tunnel Release    CHOLECYSTECTOMY     DENTAL SURGERY  12/2014   ESOPHAGOGASTRODUODENOSCOPY (EGD) WITH PROPOFOL N/A 02/16/2024   Procedure: ESOPHAGOGASTRODUODENOSCOPY (EGD) WITH PROPOFOL;  Surgeon: Dolores Frame, MD;  Location: [facility];  Service: Gastroenterology;  Laterality: N/A;  11:30am;ASA 1   mole exc     x 3   MUSCLE BIOPSY  2008   Right leg   POLYPECTOMY  02/16/2024   Procedure: POLYPECTOMY;  Surgeon: Dolores Frame, MD;  Location: [facility];  Service: Gastroenterology;;   Gaspar Bidding DILATION  02/16/2024   Procedure: EGD, WITH DILATION USING SAVARY-GILLIARD DILATOR OVER GUIDEWIRE;  Surgeon: Dolores Frame, MD;  Location: [facility];  Service: Gastroenterology;;   TUBAL LIGATION     Social History   Tobacco Use   Smoking status: Former    Current packs/day: 0.00    Types: Cigarettes    Quit date: 11/04/2010    Years since quitting: 13.3    Passive exposure: Past   Smokeless tobacco: Never  Vaping Use   Vaping status: Never Used  Substance Use Topics   Alcohol use: Yes    Comment: one mixed drink per month   Drug use: No   Family History  Problem Relation Age of Onset   Fibromyalgia Mother    Diabetes type II Mother    Depression Mother    Alzheimer's disease Mother    GER disease Mother    Heart disease Mother    Paranoid behavior Mother    Dementia Mother    Heart attack Father 52       MI x5   Stroke Father        CVA x7   Asthma Father    Brain cancer Father    Seizures Father    Anxiety disorder Sister    OCD Sister    Sexual abuse Sister    Physical abuse Sister    Anxiety disorder Sister    ADD / ADHD Daughter    ADD / ADHD Daughter    Alcohol abuse Neg Hx    Drug abuse Neg Hx     Schizophrenia Neg Hx    Allergies  Allergen Reactions   Dust Mite Extract Cough, Other (See Comments) and Shortness Of Breath    Wheezing  Other Reaction(s): Cough, Other (See Comments)  Other reaction(s): Wheezing    Wheezing    Wheezing Other reaction(s): Wheezing Wheezing Wheezing Other reaction(s): Wheezing Wheezing Wheezing Other reaction(s): Wheezing Wheezing Wheezing Other reaction(s): Wheezing Wheezing Wheezing Other reaction(s): Wheezing Wheezing Wheezing Other reaction(s): Wheezing Wheezing Wheezing Other reaction(s): Wheezing Wheezing    Wheezing, Other reaction(s): Wheezing, Wheezing, Wheezing, Other reaction(s): Wheezing, Wheezing, Wheezing, Other reaction(s): Wheezing, Wheezing, Wheezing, Other reaction(s): Wheezing, Wheezing, Wheezing, Other reaction(s): Wheezing, Wheezing, Wheezing, Other reaction(s): Wheezing, Wheezing, Wheezing, Other reaction(s): Wheezing, Wheezing   Molds & Smuts Shortness Of Breath    wheezing  wheezing wheezing wheezing    wheezing, wheezing, wheezing   Sulfa Antibiotics Anaphylaxis     : Angioedema    Trichophyton Shortness Of Breath    wheezing  wheezing    wheezing wheezing wheezing wheezing wheezing wheezing wheezing wheezing wheezing wheezing wheezing wheezing wheezing wheezing wheezing wheezing wheezing    wheezing, wheezing, wheezing, wheezing, wheezing, wheezing, wheezing, wheezing, wheezing, wheezing, wheezing, wheezing, wheezing, wheezing, wheezing, wheezing, wheezing   Carbamazepine Rash    Other Reaction(s): Other (See Comments)   Gluten Meal Itching   Quetiapine Fumarate Other (See Comments)   Review of Systems  Respiratory:  Positive for cough, sputum production and shortness of breath (With exertion, chronic and unchanged).   Musculoskeletal:  Positive for myalgias (Right chest wall x 3 months).  All other systems reviewed and are negative.    Objective:     BP (!) 142/83 (BP Location: Right Arm, Patient Position:  Sitting, Cuff Size: Large)   Pulse 72   Ht 5\' 11"  (1.803 m)   Wt 231 lb 9.6 oz (105.1 kg)   LMP 01/04/2018   SpO2 97%   BMI 32.30 kg/m  BP Readings from Last 3 Encounters:  03/08/24 (!) 142/83  02/16/24 (!) 124/49  01/22/24 134/74   Physical Exam Vitals reviewed.  Constitutional:      General: She is not in acute distress.    Appearance: Normal appearance. She is obese. She is not toxic-appearing.  HENT:     Head: Normocephalic and atraumatic.     Right Ear: External ear normal.     Left Ear: External ear normal.     Nose: Nose normal. No congestion or rhinorrhea.  Mouth/Throat:     Mouth: Mucous membranes are moist.     Pharynx: Oropharynx is clear. No oropharyngeal exudate or posterior oropharyngeal erythema.  Eyes:     General: No scleral icterus.    Extraocular Movements: Extraocular movements intact.     Conjunctiva/sclera: Conjunctivae normal.     Pupils: Pupils are equal, round, and reactive to light.  Cardiovascular:     Rate and Rhythm: Normal rate and regular rhythm.     Pulses: Normal pulses.     Heart sounds: Normal heart sounds. No murmur heard.    No friction rub. No gallop.  Pulmonary:     Effort: Pulmonary effort is normal.     Breath sounds: Normal breath sounds. No wheezing, rhonchi or rales.  Abdominal:     General: Abdomen is flat. Bowel sounds are normal. There is no distension.     Palpations: Abdomen is soft.     Tenderness: There is no abdominal tenderness.  Musculoskeletal:        General: Tenderness (There is tenderness to palpation of the right anterior chest wall below the clavicle) present. No swelling. Normal range of motion.     Cervical back: Normal range of motion.     Right lower leg: No edema.     Left lower leg: No edema.  Lymphadenopathy:     Cervical: No cervical adenopathy.  Skin:    General: Skin is warm and dry.     Capillary Refill: Capillary refill takes less than 2 seconds.     Coloration: Skin is not jaundiced.   Neurological:     General: No focal deficit present.     Mental Status: She is alert and oriented to person, place, and time.  Psychiatric:        Mood and Affect: Mood normal.        Behavior: Behavior normal.   Last CBC Lab Results  Component Value Date   WBC 8.5 12/05/2023   HGB 13.8 12/05/2023   HCT 41.4 12/05/2023   MCV 93 12/05/2023   MCH 31.1 12/05/2023   RDW 14.5 12/05/2023   PLT 258 12/05/2023   Last metabolic panel Lab Results  Component Value Date   GLUCOSE 102 (H) 03/05/2024   NA 142 03/05/2024   K 4.7 03/05/2024   CL 105 03/05/2024   CO2 23 03/05/2024   BUN 19 03/05/2024   CREATININE 1.13 (H) 03/05/2024   EGFR 57 (L) 03/05/2024   CALCIUM 10.1 03/05/2024   PROT 6.7 03/05/2024   ALBUMIN 4.8 03/05/2024   LABGLOB 1.9 03/05/2024   AGRATIO 1.5 03/29/2016   BILITOT 0.6 03/05/2024   ALKPHOS 84 03/05/2024   AST 33 03/05/2024   ALT 37 (H) 03/05/2024   ANIONGAP 11 10/02/2023   Last lipids Lab Results  Component Value Date   CHOL 165 12/05/2023   HDL 49 12/05/2023   LDLCALC 88 12/05/2023   TRIG 165 (H) 12/05/2023   CHOLHDL 3.4 12/05/2023   Last hemoglobin A1c Lab Results  Component Value Date   HGBA1C 6.2 (A) 11/07/2023   Last thyroid functions Lab Results  Component Value Date   TSH 1.500 03/05/2024   Last vitamin D Lab Results  Component Value Date   VD25OH 73.7 10/27/2023   Last vitamin B12 and Folate Lab Results  Component Value Date   VITAMINB12 397 08/29/2019   FOLATE >20.0 08/29/2019   The 10-year ASCVD risk score (Arnett DK, et al., 2019) is: 4.2%    Assessment & Plan:  Problem List Items Addressed This Visit       Migraine   Recently seen by neurology for follow-up.  She is currently prescribed Emgality, eletriptan, and Zofran.      Moderate persistent asthma - Primary   Currently using albuterol as a rescue inhaler.  Not on maintenance therapy.  She endorses increased use of albuterol recently and also reports a 29-month  history of increased cough/sputum production.  Chronic shortness of breath with exertion.  Unchanged.  Pulmonary exam today is unremarkable.  Will start Trelegy as a maintenance inhaler in the setting of asthma.      Obstructive sleep apnea (adult) (pediatric)   She endorses nightly compliance with CPAP, however also notes that her watch has noted decreased O2 sats overnight.  Will request CPAP compliance report      Chronic kidney disease, stage 3b (HCC)   Renal function stable on labs from 4/1.      Mixed hyperlipidemia   Lipid panel updated in December reflects adequate control with rosuvastatin 40 mg daily.      Anxiety and depression   Mood is stable currently.  Seen by psychiatry for follow-up yesterday (4/3).  She remains on Lamictal, Klonopin, Prozac, and Wellbutrin.  Tapering trazodone.      Breast cancer screening by mammogram   Screening mammogram ordered today      Right-sided chest wall pain   She endorses a 53-month history of anterior right chest wall pain.  There is tenderness to palpation on exam just below the clavicle.  No palpable masses appreciated.  MSK etiology suspected given chronicity.  Will update screening mammogram.       Return in about 3 months (around 06/07/2024).    Billie Lade, MD

## 2024-03-08 NOTE — Assessment & Plan Note (Signed)
 Currently using albuterol as a rescue inhaler.  Not on maintenance therapy.  She endorses increased use of albuterol recently and also reports a 42-month history of increased cough/sputum production.  Chronic shortness of breath with exertion.  Unchanged.  Pulmonary exam today is unremarkable.  Will start Trelegy as a maintenance inhaler in the setting of asthma.

## 2024-03-08 NOTE — Assessment & Plan Note (Signed)
 She endorses a 10-month history of anterior right chest wall pain.  There is tenderness to palpation on exam just below the clavicle.  No palpable masses appreciated.  MSK etiology suspected given chronicity.  Will update screening mammogram.

## 2024-03-08 NOTE — Assessment & Plan Note (Signed)
 Screening mammogram ordered today

## 2024-03-08 NOTE — Assessment & Plan Note (Signed)
 Mood is stable currently.  Seen by psychiatry for follow-up yesterday (4/3).  She remains on Lamictal, Klonopin, Prozac, and Wellbutrin.  Tapering trazodone.

## 2024-03-08 NOTE — Assessment & Plan Note (Signed)
 Lipid panel updated in December reflects adequate control with rosuvastatin 40 mg daily.

## 2024-03-09 ENCOUNTER — Other Ambulatory Visit (HOSPITAL_COMMUNITY): Payer: Self-pay

## 2024-03-11 ENCOUNTER — Other Ambulatory Visit: Payer: Self-pay

## 2024-03-11 ENCOUNTER — Other Ambulatory Visit (HOSPITAL_COMMUNITY): Payer: Self-pay

## 2024-03-11 ENCOUNTER — Ambulatory Visit (HOSPITAL_COMMUNITY)
Admission: RE | Admit: 2024-03-11 | Discharge: 2024-03-11 | Disposition: A | Discharge status: Home / Self Care | Payer: PPO | Source: Ambulatory Visit | Attending: Gastroenterology | Admitting: Gastroenterology

## 2024-03-11 ENCOUNTER — Ambulatory Visit (HOSPITAL_COMMUNITY): Payer: PPO | Attending: Gastroenterology | Admitting: Speech Pathology

## 2024-03-11 ENCOUNTER — Encounter (HOSPITAL_COMMUNITY): Payer: Self-pay | Admitting: Speech Pathology

## 2024-03-11 DIAGNOSIS — T17308D Unspecified foreign body in larynx causing other injury, subsequent encounter: Secondary | ICD-10-CM

## 2024-03-11 DIAGNOSIS — R1312 Dysphagia, oropharyngeal phase: Secondary | ICD-10-CM | POA: Diagnosis not present

## 2024-03-11 DIAGNOSIS — K219 Gastro-esophageal reflux disease without esophagitis: Secondary | ICD-10-CM

## 2024-03-11 DIAGNOSIS — R638 Other symptoms and signs concerning food and fluid intake: Secondary | ICD-10-CM | POA: Diagnosis not present

## 2024-03-11 DIAGNOSIS — R1310 Dysphagia, unspecified: Secondary | ICD-10-CM | POA: Diagnosis not present

## 2024-03-11 NOTE — Therapy (Signed)
 Modified Barium Swallow Study  Patient Details  Name: Michelle Clements MRN: [id number] Date of Birth: [birth year]  Today's Date: 03/11/2024  HPI/PMH: HPI: Michelle Clements is a 58 yo female who was referred for MBSS by Dr. Sammi Crick due to Pt with reports of occasional difficulty swallowing liquids. She has a past medical endoscopic ultrasound CVID, depression, bipolar disorder, asthma, depression, GERD, IBS-C, PTSD, diabetes, interstitial cystitis, CKD,, fibromyalgia, who presents for evaluation of dysphagia and nausea. Pt also reports difficulty with memory of unknown etiology.  R13.10 (ICD-10-CM) - Dysphagia T17.308A (ICD-10-CM) - Choking K21.9 (ICD-10-CM) - GERD (gastroesophageal reflux disease)  Clinical Impression: Clinical Impression: Oropharyngeal swallow is essentially WNL. Pt with swallow trigger at the level of the valleculae and adequate hyolarygeal excursion with flash penetration of thins during sequential cup/straw sips of thin without aspiration and no significant oropharyngeal residuals post swallow (can be normal age variance). Pt reports that she is often bothered by a feeling of her "throat closing", occasional nausea, and a "gravely voice". EPIC chart review reveals that she saw ENT, Dr. Iline Mallory and SLP in 2023 for voice. Pt also voices concerns about memory problems. Recommend regular textures and thin liquids with standard aspiration and reflux precautions. No further SLP services indicated at this time. She was advised to discuss her memory changes with her PCP and consider further assessment. Please see note from Dr. Brent Cambric below:  <<ASSESSMENT: RTransnasal Flexible Laryngoscopy with Videostroboscopy  The exam reveals a larynx with:  Mild diffuse erythema  Modest posterior laryngeal edema  The vocal fold mobility is preserved  There is minimal midfold atrophy and a small thickening at the anterior portion of the right vocal fold which through most of the exam is  suggestive of mucus but we could not get it to clear  At the point of vocal process to vocal process contact, there is a less than 1 mm glottal gap and a slight hourglass configuration  Muscle tension patterns II and III allow for near complete glottal closure (an antero-posterior and lateral "squeeze")  Mucosal vibration is minimally diminished bilaterally  There are no lesions on the free edge of the vocal fold, or elsewhere in the larynx worrisome for malignancy  The mucosa of the post-cricoid and interarytenoid region is modestly redundant There are no secretions pooled in either pyriform sinus, and no aspiration is identified on this examination. There is however thick endolaryngeal mucus  The tongue base and epiglottis are structurally normal  The visualized subglottis and proximal trachea are widely patenteassuring laryngeal examination but for thick endolaryngeal mucus, mild bilateral vocal fold atrophy, and a possible sessile lesion at the anterior portion of the right vocal fold. Although her dysphonia and efforts at being successful in her choir are problematic, she was disappointed that I was unable to focus more on her snoring complaints.>> Iline Mallory, MD 03/15/22 1410  SLP note from 04/2022: Perceptually, Michelle Clements's voice was characterized by normal loudness, slightly low pitch, mild roughness, and mild strain. Resonance was moderately posterior in locus. Respiratory support during conversation was fair and marked by use of functional residual capacity. Visible muscle recruitment during phonation was not observed.>>  Factors that may increase risk of adverse event in presence of aspiration Roderick Civatte & Jessy Morocco 2021): No data recorded  Recommendations/Plan: Swallowing Evaluation Recommendations Swallowing Evaluation Recommendations Recommendations: PO diet PO Diet Recommendation: Regular; Thin liquids (Level 0) Liquid Administration via: Cup; Straw Medication  Administration: Whole meds with liquid Supervision: Patient able to self-feed Postural  changes: Position pt fully upright for meals; Stay upright 30-60 min after meals Oral care recommendations: Oral care BID (2x/day) Recommended consults: Consider ENT consultation (due to mild hoarseness, however Pt says she saw an ENT)   Treatment Plan Treatment Plan Treatment recommendations: No treatment recommended at this time Follow-up recommendations: No SLP follow up  Recommendations Recommendations for follow up therapy are one component of a multi-disciplinary discharge planning process, led by the attending physician.  Recommendations may be updated based on patient status, additional functional criteria and insurance authorization.  Assessment: Orofacial Exam: Orofacial Exam Oral Cavity: Oral Hygiene: WFL; Xerostomia Oral Cavity - Dentition: Adequate natural dentition Orofacial Anatomy: WFL Oral Motor/Sensory Function: WFL   Anatomy:  Anatomy: WFL; Suspected cervical osteophytes   Boluses Administered: Boluses Administered Boluses Administered: Thin liquids (Level 0); Mildly thick liquids (Level 2, nectar thick); Puree; Solid    Oral Impairment Domain: Oral Impairment Domain Lip Closure: No labial escape Tongue control during bolus hold: Cohesive bolus between tongue to palatal seal Bolus preparation/mastication: Timely and efficient chewing and mashing Bolus transport/lingual motion: Brisk tongue motion Oral residue: Trace residue lining oral structures Location of oral residue : Tongue Initiation of pharyngeal swallow : Valleculae    Pharyngeal Impairment Domain: Pharyngeal Impairment Domain Soft palate elevation: No bolus between soft palate (SP)/pharyngeal wall (PW) Laryngeal elevation: Complete superior movement of thyroid cartilage with complete approximation of arytenoids to epiglottic petiole Anterior hyoid excursion: Complete anterior movement Epiglottic  movement: Complete inversion Laryngeal vestibule closure: Incomplete, narrow column air/contrast in laryngeal vestibule Pharyngeal stripping wave : Present - complete Pharyngeal contraction (A/P view only): Complete Pharyngoesophageal segment opening: Complete distension and complete duration, no obstruction of flow Tongue base retraction: No contrast between tongue base and posterior pharyngeal wall (PPW) Pharyngeal residue: Complete pharyngeal clearance Location of pharyngeal residue: N/A   Esophageal Impairment Domain: Esophageal Impairment Domain Esophageal clearance upright position: Complete clearance, esophageal coating   Pill: Pill Consistency administered: Thin liquids (Level 0) Thin liquids (Level 0): [facility]   Penetration/Aspiration Scale Score: Penetration/Aspiration Scale Score 1.  Material does not enter airway: Mildly thick liquids (Level 2, nectar thick); Puree; Pill; Solid 2.  Material enters airway, remains ABOVE vocal cords then ejected out: Thin liquids (Level 0)   Compensatory Strategies: Compensatory Strategies Compensatory strategies: No     General Information: Caregiver present: No   Diet Prior to this Study: Regular; Thin liquids (Level 0)    Temperature : Normal    Respiratory Status: WFL    Supplemental O2: None (Room air)    History of Recent Intubation: No   Behavior/Cognition: Alert; Cooperative; Pleasant mood  Self-Feeding Abilities: Able to self-feed  Baseline vocal quality/speech: Normal (mild hoarse/wet-? allergies)  Volitional Cough: Able to elicit  Volitional Swallow: Able to elicit  Exam Limitations: No limitations   Pain: Pain Assessment Pain Assessment: No/denies pain   End of Session: Start Time:No data recorded Stop Time: No data recorded Time Calculation:No data recorded Charges: No data recorded SLP visit diagnosis: SLP Visit Diagnosis: Dysphagia, oropharyngeal phase (R13.12)    Past Medical History:   Past Medical History:  Diagnosis Date   Allergic rhinitis    Anemia    Arthritis    Lower back and hips   Asthma    Bipolar disorder (HCC)    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HCC)    Constipation    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HCC)    Depression    Essential hypertension, benign    Fibromyalgia    GERD (  gastroesophageal reflux disease)    H/O sleep apnea    Hip pain, left    History of palpitations    Negative Holter monitor   History of pneumonia 1988   Hyperlipidemia    IBS (irritable bowel syndrome)    Nausea and vomiting 02/16/2024   Neck pain    Obstructive sleep apnea (adult) (pediatric) 04/12/2021   Poor short term memory    PTSD (post-traumatic stress disorder)    S/P Botox injection 11/2014    For migraine headaches   Type 2 diabetes mellitus (HCC)    Urgency of urination    Past Surgical History:  Past Surgical History:  Procedure Laterality Date   CARPAL TUNNEL RELEASE Right 06/10/2013   Procedure: CARPAL TUNNEL RELEASE;  Surgeon: Augustine Blocker, MD;  Location: [facility];  Service: Neurosurgery;  Laterality: Right;  Right Carpal Tunnel Release    CHOLECYSTECTOMY     DENTAL SURGERY  12/2014   ESOPHAGOGASTRODUODENOSCOPY (EGD) WITH PROPOFOL N/A 02/16/2024   Procedure: ESOPHAGOGASTRODUODENOSCOPY (EGD) WITH PROPOFOL;  Surgeon: Urban Garden, MD;  Location: [facility];  Service: Gastroenterology;  Laterality: N/A;  11:30am;ASA 1   mole exc     x 3   MUSCLE BIOPSY  2008   Right leg   POLYPECTOMY  02/16/2024   Procedure: POLYPECTOMY;  Surgeon: Urban Garden, MD;  Location: [facility];  Service: Gastroenterology;;   Dixie Frederickson DILATION  02/16/2024   Procedure: EGD, WITH DILATION USING SAVARY-GILLIARD DILATOR OVER GUIDEWIRE;  Surgeon: Urban Garden, MD;  Location: [facility];  Service: Gastroenterology;;   TUBAL LIGATION     Thank you,  Claudetta Cuba, CCC-SLP [phone]  Taray Normoyle 03/11/2024, 5:10 PM

## 2024-03-12 ENCOUNTER — Other Ambulatory Visit: Payer: Self-pay

## 2024-03-12 ENCOUNTER — Other Ambulatory Visit (HOSPITAL_COMMUNITY): Payer: Self-pay | Admitting: Psychiatry

## 2024-03-12 DIAGNOSIS — S338XXA Sprain of other parts of lumbar spine and pelvis, initial encounter: Secondary | ICD-10-CM | POA: Diagnosis not present

## 2024-03-12 DIAGNOSIS — M26601 Right temporomandibular joint disorder, unspecified: Secondary | ICD-10-CM | POA: Diagnosis not present

## 2024-03-12 DIAGNOSIS — D801 Nonfamilial hypogammaglobulinemia: Secondary | ICD-10-CM | POA: Diagnosis not present

## 2024-03-12 DIAGNOSIS — S134XXA Sprain of ligaments of cervical spine, initial encounter: Secondary | ICD-10-CM | POA: Diagnosis not present

## 2024-03-12 DIAGNOSIS — S233XXA Sprain of ligaments of thoracic spine, initial encounter: Secondary | ICD-10-CM | POA: Diagnosis not present

## 2024-03-13 ENCOUNTER — Other Ambulatory Visit: Payer: Self-pay

## 2024-03-13 ENCOUNTER — Other Ambulatory Visit (HOSPITAL_COMMUNITY): Payer: Self-pay

## 2024-03-13 ENCOUNTER — Other Ambulatory Visit: Payer: Self-pay | Admitting: Internal Medicine

## 2024-03-13 MED ORDER — LUBIPROSTONE 24 MCG PO CAPS
24.0000 ug | ORAL_CAPSULE | Freq: Two times a day (BID) | ORAL | 3 refills | Status: DC
  Filled 2024-03-13: qty 60, 30d supply, fill #0
  Filled 2024-03-27 – 2024-04-09 (×2): qty 60, 30d supply, fill #1
  Filled 2024-04-24 – 2024-05-02 (×2): qty 60, 30d supply, fill #2
  Filled 2024-05-30 – 2024-06-13 (×3): qty 60, 30d supply, fill #3

## 2024-03-14 ENCOUNTER — Other Ambulatory Visit: Payer: Self-pay

## 2024-03-14 DIAGNOSIS — R42 Dizziness and giddiness: Secondary | ICD-10-CM | POA: Diagnosis not present

## 2024-03-14 DIAGNOSIS — M65311 Trigger thumb, right thumb: Secondary | ICD-10-CM | POA: Diagnosis not present

## 2024-03-14 DIAGNOSIS — S233XXA Sprain of ligaments of thoracic spine, initial encounter: Secondary | ICD-10-CM | POA: Diagnosis not present

## 2024-03-14 DIAGNOSIS — M9902 Segmental and somatic dysfunction of thoracic region: Secondary | ICD-10-CM | POA: Diagnosis not present

## 2024-03-14 DIAGNOSIS — M9903 Segmental and somatic dysfunction of lumbar region: Secondary | ICD-10-CM | POA: Diagnosis not present

## 2024-03-14 DIAGNOSIS — M47816 Spondylosis without myelopathy or radiculopathy, lumbar region: Secondary | ICD-10-CM | POA: Diagnosis not present

## 2024-03-14 DIAGNOSIS — M9901 Segmental and somatic dysfunction of cervical region: Secondary | ICD-10-CM | POA: Diagnosis not present

## 2024-03-15 ENCOUNTER — Other Ambulatory Visit (HOSPITAL_COMMUNITY): Payer: Self-pay

## 2024-03-15 ENCOUNTER — Other Ambulatory Visit: Payer: Self-pay

## 2024-03-15 DIAGNOSIS — F319 Bipolar disorder, unspecified: Secondary | ICD-10-CM | POA: Diagnosis not present

## 2024-03-15 NOTE — Telephone Encounter (Signed)
 It has been resulted and printed for Michelle Clements' review.

## 2024-03-15 NOTE — Telephone Encounter (Signed)
 Have you seen any communication from [facility] about that prolactin level we tried to add on?

## 2024-03-18 ENCOUNTER — Other Ambulatory Visit: Payer: Self-pay

## 2024-03-18 ENCOUNTER — Other Ambulatory Visit (HOSPITAL_COMMUNITY): Payer: Self-pay

## 2024-03-19 DIAGNOSIS — M9903 Segmental and somatic dysfunction of lumbar region: Secondary | ICD-10-CM | POA: Diagnosis not present

## 2024-03-19 DIAGNOSIS — M9901 Segmental and somatic dysfunction of cervical region: Secondary | ICD-10-CM | POA: Diagnosis not present

## 2024-03-19 DIAGNOSIS — M47816 Spondylosis without myelopathy or radiculopathy, lumbar region: Secondary | ICD-10-CM | POA: Diagnosis not present

## 2024-03-19 DIAGNOSIS — R42 Dizziness and giddiness: Secondary | ICD-10-CM | POA: Diagnosis not present

## 2024-03-19 DIAGNOSIS — M9902 Segmental and somatic dysfunction of thoracic region: Secondary | ICD-10-CM | POA: Diagnosis not present

## 2024-03-19 DIAGNOSIS — S233XXA Sprain of ligaments of thoracic spine, initial encounter: Secondary | ICD-10-CM | POA: Diagnosis not present

## 2024-03-20 ENCOUNTER — Other Ambulatory Visit (HOSPITAL_COMMUNITY): Payer: Self-pay

## 2024-03-20 ENCOUNTER — Other Ambulatory Visit: Payer: Self-pay

## 2024-03-20 DIAGNOSIS — F319 Bipolar disorder, unspecified: Secondary | ICD-10-CM | POA: Diagnosis not present

## 2024-03-21 ENCOUNTER — Encounter (INDEPENDENT_AMBULATORY_CARE_PROVIDER_SITE_OTHER): Payer: Self-pay | Admitting: Gastroenterology

## 2024-03-27 ENCOUNTER — Other Ambulatory Visit: Payer: Self-pay

## 2024-03-27 DIAGNOSIS — M47816 Spondylosis without myelopathy or radiculopathy, lumbar region: Secondary | ICD-10-CM | POA: Diagnosis not present

## 2024-03-27 DIAGNOSIS — M9901 Segmental and somatic dysfunction of cervical region: Secondary | ICD-10-CM | POA: Diagnosis not present

## 2024-03-27 DIAGNOSIS — S233XXA Sprain of ligaments of thoracic spine, initial encounter: Secondary | ICD-10-CM | POA: Diagnosis not present

## 2024-03-27 DIAGNOSIS — M9902 Segmental and somatic dysfunction of thoracic region: Secondary | ICD-10-CM | POA: Diagnosis not present

## 2024-03-27 DIAGNOSIS — M9903 Segmental and somatic dysfunction of lumbar region: Secondary | ICD-10-CM | POA: Diagnosis not present

## 2024-03-27 DIAGNOSIS — R42 Dizziness and giddiness: Secondary | ICD-10-CM | POA: Diagnosis not present

## 2024-03-28 ENCOUNTER — Encounter (INDEPENDENT_AMBULATORY_CARE_PROVIDER_SITE_OTHER): Payer: Self-pay

## 2024-03-28 DIAGNOSIS — M25641 Stiffness of right hand, not elsewhere classified: Secondary | ICD-10-CM | POA: Diagnosis not present

## 2024-04-01 ENCOUNTER — Other Ambulatory Visit: Payer: Self-pay

## 2024-04-02 DIAGNOSIS — M9901 Segmental and somatic dysfunction of cervical region: Secondary | ICD-10-CM | POA: Diagnosis not present

## 2024-04-02 DIAGNOSIS — D801 Nonfamilial hypogammaglobulinemia: Secondary | ICD-10-CM | POA: Diagnosis not present

## 2024-04-02 DIAGNOSIS — S233XXA Sprain of ligaments of thoracic spine, initial encounter: Secondary | ICD-10-CM | POA: Diagnosis not present

## 2024-04-02 DIAGNOSIS — R42 Dizziness and giddiness: Secondary | ICD-10-CM | POA: Diagnosis not present

## 2024-04-02 DIAGNOSIS — E1122 Type 2 diabetes mellitus with diabetic chronic kidney disease: Secondary | ICD-10-CM | POA: Diagnosis not present

## 2024-04-02 DIAGNOSIS — M9902 Segmental and somatic dysfunction of thoracic region: Secondary | ICD-10-CM | POA: Diagnosis not present

## 2024-04-02 DIAGNOSIS — M9903 Segmental and somatic dysfunction of lumbar region: Secondary | ICD-10-CM | POA: Diagnosis not present

## 2024-04-02 DIAGNOSIS — N1831 Chronic kidney disease, stage 3a: Secondary | ICD-10-CM | POA: Diagnosis not present

## 2024-04-02 DIAGNOSIS — M47816 Spondylosis without myelopathy or radiculopathy, lumbar region: Secondary | ICD-10-CM | POA: Diagnosis not present

## 2024-04-02 DIAGNOSIS — I10 Essential (primary) hypertension: Secondary | ICD-10-CM | POA: Diagnosis not present

## 2024-04-02 LAB — BASIC METABOLIC PANEL WITH GFR: Creatinine: 1.2 — AB (ref 0.5–1.1)

## 2024-04-02 LAB — COMPREHENSIVE METABOLIC PANEL WITH GFR: eGFR: 55

## 2024-04-02 LAB — PROTEIN / CREATININE RATIO, URINE: Creatinine, Urine: 33

## 2024-04-04 ENCOUNTER — Other Ambulatory Visit (HOSPITAL_COMMUNITY): Payer: Self-pay

## 2024-04-04 ENCOUNTER — Other Ambulatory Visit: Payer: Self-pay

## 2024-04-04 ENCOUNTER — Telehealth (HOSPITAL_COMMUNITY): Admitting: Psychiatry

## 2024-04-04 ENCOUNTER — Encounter (HOSPITAL_COMMUNITY): Payer: Self-pay | Admitting: Psychiatry

## 2024-04-04 DIAGNOSIS — F319 Bipolar disorder, unspecified: Secondary | ICD-10-CM | POA: Diagnosis not present

## 2024-04-04 DIAGNOSIS — F332 Major depressive disorder, recurrent severe without psychotic features: Secondary | ICD-10-CM

## 2024-04-04 MED ORDER — FLUOXETINE HCL 40 MG PO CAPS
40.0000 mg | ORAL_CAPSULE | Freq: Every day | ORAL | 2 refills | Status: DC
  Filled 2024-04-04 – 2024-04-09 (×2): qty 30, 30d supply, fill #0
  Filled 2024-04-24 – 2024-05-02 (×2): qty 30, 30d supply, fill #1
  Filled 2024-05-30 – 2024-06-13 (×3): qty 30, 30d supply, fill #2

## 2024-04-04 MED ORDER — LAMOTRIGINE 25 MG PO TABS
25.0000 mg | ORAL_TABLET | Freq: Two times a day (BID) | ORAL | 2 refills | Status: DC
  Filled 2024-04-04 – 2024-04-09 (×2): qty 60, 30d supply, fill #0
  Filled 2024-04-24 – 2024-05-02 (×2): qty 60, 30d supply, fill #1
  Filled 2024-05-30 – 2024-06-13 (×3): qty 60, 30d supply, fill #2

## 2024-04-04 MED ORDER — CLONAZEPAM 0.5 MG PO TABS
0.5000 mg | ORAL_TABLET | Freq: Every day | ORAL | 2 refills | Status: DC
  Filled 2024-04-04 – 2024-04-25 (×2): qty 30, 30d supply, fill #0
  Filled 2024-05-29: qty 30, 30d supply, fill #1
  Filled 2024-06-24 – 2024-06-27 (×2): qty 30, 30d supply, fill #2

## 2024-04-04 MED ORDER — BUPROPION HCL ER (XL) 150 MG PO TB24
150.0000 mg | ORAL_TABLET | Freq: Every morning | ORAL | 2 refills | Status: DC
  Filled 2024-04-04 – 2024-04-09 (×2): qty 30, 30d supply, fill #0
  Filled 2024-04-24 – 2024-05-02 (×2): qty 30, 30d supply, fill #1
  Filled 2024-05-30 – 2024-06-13 (×3): qty 30, 30d supply, fill #2

## 2024-04-04 NOTE — Progress Notes (Signed)
 Virtual Visit via Video Note  I connected with Michelle Clements on 04/04/24 at  1:00 PM EDT by a video enabled telemedicine application and verified that I am speaking with the correct person using two identifiers.  Location: Patient: home Provider: office   I discussed the limitations of evaluation and management by telemedicine and the availability of in person appointments. The patient expressed understanding and agreed to proceed.     I discussed the assessment and treatment plan with the patient. The patient was provided an opportunity to ask questions and all were answered. The patient agreed with the plan and demonstrated an understanding of the instructions.   The patient was advised to call back or seek an in-person evaluation if the symptoms worsen or if the condition fails to improve as anticipated.  I provided 20 minutes of non-face-to-face time during this encounter.   Alfredia Annas, MD  [facility] MD/PA/NP OP Progress Note  04/04/2024 1:14 PM Michelle Clements  MRN:  [id number]  Chief Complaint:  Chief Complaint  Patient presents with   Depression   Anxiety   Follow-up   HPI:  This patient is a 58 year old married white female who lives with her husband in [place]. She has 2 grown daughters. 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   The patient returns for follow-up after 4 weeks regarding her depression manic symptoms and anxiety.  Last time she stated that she felt oversedated.  She has weaned herself off the trazodone  and is still sleeping fairly well.  She uses melatonin as needed.  She is also going to talk to neurology about getting off the tizanidine .  She wants to cut down the clonazepam  even further.  She is down to 1 mg at bedtime and will now want to cut it down to 0.5 mg at bedtime which I think is reasonable.  She denies significant depression thoughts of self-harm or suicidal ideation.  She thinks that the medications for depression and mood are helping.  She  states that she is much less sedated through the day and has more energy since stopping the trazodone . Visit Diagnosis:    ICD-10-CM   1. Bipolar 1 disorder (HCC)  F31.9     2. Major depressive disorder, recurrent, severe without psychotic features (HCC)  F33.2       Past Psychiatric History:  Admission 2 years ago for overdose/suicide attempt followed by partial hospital program. She was also admitted for detox several years ago  Past Medical History:  Past Medical History:  Diagnosis Date   Allergic rhinitis    Anemia    Arthritis    Lower back and hips   Asthma    Bipolar disorder (HCC)    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HCC)    Constipation    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HCC)    Depression    Essential hypertension, benign    Fibromyalgia    GERD (gastroesophageal reflux disease)    H/O sleep apnea    Hip pain, left    History of palpitations    Negative Holter monitor   History of pneumonia 1988   Hyperlipidemia    IBS (irritable bowel syndrome)    Nausea and vomiting 02/16/2024   Neck pain    Obstructive sleep apnea (adult) (pediatric) 04/12/2021   Poor short term memory    PTSD (post-traumatic stress disorder)    S/P Botox injection 11/2014    For migraine headaches   Type 2 diabetes mellitus (HCC)  Urgency of urination     Past Surgical History:  Procedure Laterality Date   CARPAL TUNNEL RELEASE Right 06/10/2013   Procedure: CARPAL TUNNEL RELEASE;  Surgeon: Augustine Blocker, MD;  Location: [facility];  Service: Neurosurgery;  Laterality: Right;  Right Carpal Tunnel Release    CHOLECYSTECTOMY     DENTAL SURGERY  12/2014   ESOPHAGOGASTRODUODENOSCOPY (EGD) WITH PROPOFOL  N/A 02/16/2024   Procedure: ESOPHAGOGASTRODUODENOSCOPY (EGD) WITH PROPOFOL ;  Surgeon: Urban Garden, MD;  Location: [facility];  Service: Gastroenterology;  Laterality: N/A;  11:30am;ASA 1   mole exc     x 3   MUSCLE BIOPSY  2008   Right leg   POLYPECTOMY  02/16/2024    Procedure: POLYPECTOMY;  Surgeon: Umberto Ganong, Bearl Limes, MD;  Location: [facility];  Service: Gastroenterology;;   Dixie Frederickson DILATION  02/16/2024   Procedure: EGD, WITH DILATION USING SAVARY-GILLIARD DILATOR OVER GUIDEWIRE;  Surgeon: Umberto Ganong, Bearl Limes, MD;  Location: [facility];  Service: Gastroenterology;;   TUBAL LIGATION      Family Psychiatric History: See below  Family History:  Family History  Problem Relation Age of Onset   Fibromyalgia Mother    Diabetes type II Mother    Depression Mother    Alzheimer's disease Mother    GER disease Mother    Heart disease Mother    Paranoid behavior Mother    Dementia Mother    Heart attack Father 30       MI x5   Stroke Father        CVA x7   Asthma Father    Brain cancer Father    Seizures Father    Anxiety disorder Sister    OCD Sister    Sexual abuse Sister    Physical abuse Sister    Anxiety disorder Sister    ADD / ADHD Daughter    ADD / ADHD Daughter    Alcohol abuse Neg Hx    Drug abuse Neg Hx    Schizophrenia Neg Hx     Social History:  Social History   Socioeconomic History   Marital status: Married    Spouse name: Not on file   Number of children: 2   Years of education: College   Highest education level: Associate degree: occupational, Scientist, product/process development, or vocational program  Occupational History   Occupation: Chiropractor: UNEMPLOYED    Comment: Now disabled due to bipolar and fibromyalgia  Tobacco Use   Smoking status: Former    Current packs/day: 0.00    Types: Cigarettes    Quit date: 11/04/2010    Years since quitting: 13.4    Passive exposure: Past   Smokeless tobacco: Never  Vaping Use   Vaping status: Never Used  Substance and Sexual Activity   Alcohol use: Yes    Comment: one mixed drink per month   Drug use: No   Sexual activity: Yes    Birth control/protection: Surgical  Other Topics Concern   Not on file  Social History Narrative   Married   Lives with spouse  and daughter   Right handed.   Caffeine use: 2 cups coffee in morning and 2 cups tea during the day    Social Drivers of Health   Financial Resource Strain: Medium Risk (12/05/2023)   Overall Financial Resource Strain (CARDIA)    Difficulty of Paying Living Expenses: Somewhat hard  Food Insecurity: No Food Insecurity (12/05/2023)   Hunger Vital Sign    Worried  About Running Out of Food in the Last Year: Never true    Ran Out of Food in the Last Year: Never true  Transportation Needs: No Transportation Needs (12/05/2023)   PRAPARE - Administrator, Civil Service (Medical): No    Lack of Transportation (Non-Medical): No  Physical Activity: Unknown (12/05/2023)   Exercise Vital Sign    Days of Exercise per Week: 0 days    Minutes of Exercise per Session: Not on file  Stress: Stress Concern Present (12/05/2023)   [employer] of Occupational Health - Occupational Stress Questionnaire    Feeling of Stress : To some extent  Social Connections: Unknown (12/05/2023)   Social Connection and Isolation Panel [NHANES]    Frequency of Communication with Friends and Family: Twice a week    Frequency of Social Gatherings with Friends and Family: Patient declined    Attends Religious Services: More than 4 times per year    Active Member of [employer] or Organizations: Yes    Attends Engineer, structural: More than 4 times per year    Marital Status: Married    Allergies:  Allergies  Allergen Reactions   Dust Mite Extract Cough, Other (See Comments) and Shortness Of Breath    Wheezing  Other Reaction(s): Cough, Other (See Comments)  Other reaction(s): Wheezing    Wheezing    Wheezing Other reaction(s): Wheezing Wheezing Wheezing Other reaction(s): Wheezing Wheezing Wheezing Other reaction(s): Wheezing Wheezing Wheezing Other reaction(s): Wheezing Wheezing Wheezing Other reaction(s): Wheezing Wheezing Wheezing Other reaction(s): Wheezing Wheezing Wheezing Other  reaction(s): Wheezing Wheezing    Wheezing, Other reaction(s): Wheezing, Wheezing, Wheezing, Other reaction(s): Wheezing, Wheezing, Wheezing, Other reaction(s): Wheezing, Wheezing, Wheezing, Other reaction(s): Wheezing, Wheezing, Wheezing, Other reaction(s): Wheezing, Wheezing, Wheezing, Other reaction(s): Wheezing, Wheezing, Wheezing, Other reaction(s): Wheezing, Wheezing   Molds & Smuts Shortness Of Breath    wheezing  wheezing wheezing wheezing    wheezing, wheezing, wheezing   Sulfa Antibiotics Anaphylaxis     : Angioedema    Trichophyton Shortness Of Breath    wheezing  wheezing    wheezing wheezing wheezing wheezing wheezing wheezing wheezing wheezing wheezing wheezing wheezing wheezing wheezing wheezing wheezing wheezing wheezing    wheezing, wheezing, wheezing, wheezing, wheezing, wheezing, wheezing, wheezing, wheezing, wheezing, wheezing, wheezing, wheezing, wheezing, wheezing, wheezing, wheezing   Carbamazepine  Rash    Other Reaction(s): Other (See Comments)   Gluten Meal Itching   Quetiapine Fumarate Other (See Comments)    Metabolic Disorder Labs: Lab Results  Component Value Date   HGBA1C 6.2 (A) 11/07/2023   MPG 131 03/18/2015   No results found for: "PROLACTIN" Lab Results  Component Value Date   CHOL 165 12/05/2023   TRIG 165 (H) 12/05/2023   HDL 49 12/05/2023   CHOLHDL 3.4 12/05/2023   VLDL 44 (H) 03/18/2015   LDLCALC 88 12/05/2023   LDLCALC 126 03/31/2022   Lab Results  Component Value Date   TSH 1.500 03/05/2024   TSH 1.030 10/27/2023    Therapeutic Level Labs: Lab Results  Component Value Date   LITHIUM  0.20 (L) 02/21/2012   No results found for: "VALPROATE" No results found for: "CBMZ"  Current Medications: Current Outpatient Medications  Medication Sig Dispense Refill   clonazePAM  (KLONOPIN ) 0.5 MG tablet Take 1 tablet (0.5 mg total) by mouth at bedtime. 30 tablet 2   acetaZOLAMIDE  (DIAMOX ) 250 MG tablet Take 1 tablet (250 mg total)  by mouth daily for 14 days, THEN 1 tablet (250 mg total) 2 (two) times  daily for 14 days, THEN 1 tablet (250 mg) in the morning and 2 tablets (500 mg) in the evening for 14 days, THEN 2 tablets (500 mg total) 2 (two) times daily. (Patient not taking: Reported on 03/08/2024) 180 tablet 3   albuterol  (VENTOLIN  HFA) 108 (90 Base) MCG/ACT inhaler Inhale 2 puffs into the lungs every 6 (six) hours as needed for wheezing and/or shortness of breath. 6.7 g 5   aspirin EC 81 MG tablet Take 1 tablet every day by oral route.     azelastine  (ASTELIN ) 0.1 % nasal spray Place 1 spray into both nostrils 2 (two) times daily. 30 mL 2   Blood Glucose Monitoring Suppl (ONETOUCH VERIO FLEX SYSTEM) w/Device KIT Use as directed to check blood glucose twice daily, before breakfast and before bed. 1 kit 0   Blood Glucose Monitoring Suppl (ONETOUCH VERIO) w/Device KIT Use to check glucose once daily 1 kit 0   buPROPion  (WELLBUTRIN  XL) 150 MG 24 hr tablet Take 1 tablet (150 mg total) by mouth every morning. 30 tablet 2   cetirizine  (ZYRTEC ) 10 MG tablet Take 10 mg by mouth at bedtime.     Coenzyme Q10 (COQ10 PO) Take by mouth. One daily     Continuous Glucose Sensor (FREESTYLE LIBRE 3 PLUS SENSOR) MISC Change sensor every 15 days. 6 each 3   Continuous Glucose Sensor (FREESTYLE LIBRE 3 SENSOR) MISC Apply new sensor every 14 days to monitor blood glucose. Remove old sensor before applying new one. 6 each 3   cycloSPORINE  (RESTASIS ) 0.05 % ophthalmic emulsion Place 1 drop into both eyes 2 (two) times daily. 180 each 99   estradiol  (ESTRACE ) 0.1 MG/GM vaginal cream Place 1 Applicatorful vaginally 3 (three) times a week. 42.5 g 1   FLUoxetine  (PROZAC ) 40 MG capsule Take 1 capsule (40 mg total) by mouth daily. 30 capsule 2   fluticasone  (FLONASE) 50 MCG/ACT nasal spray Place 1 spray into both nostrils daily.     Galcanezumab -gnlm (EMGALITY ) 120 MG/ML SOSY Inject 1 mL into the skin every 30 (thirty) days. 1 mL 11   Glucos-Chond-Hyal  Ac-Ca Fructo (MOVE FREE JOINT HEALTH ADVANCE PO) Take by mouth.     Immune Globulin , Human,-klhw (XEMBIFY ) 10 GM/50ML SOLN Inject 10 g into the skin every 7 (seven) days. 200 mL 3   insulin  glargine (LANTUS ) 100 UNIT/ML Solostar Pen Inject 40 Units into the skin at bedtime. 36 mL 3   ipratropium-albuterol  (DUONEB) 0.5-2.5 (3) MG/3ML SOLN Take 3 mLs by nebulization 2 (two) times daily as needed. 90 mL 2   lamoTRIgine  (LAMICTAL ) 25 MG tablet Take 1 tablet (25 mg total) by mouth 2 (two) times daily. 60 tablet 2   Lancets (ONETOUCH ULTRASOFT) lancets Use to check blood sugar once daily 100 each 3   Latanoprostene Bunod  (VYZULTA ) 0.024 % SOLN Place 1 drop into both eyes at bedtime as directed 7.5 mL 3   lidocaine  (LIDODERM ) 5 % Place 1 patch onto the skin daily, may wear up to 12 hours 30 patch 5   lubiprostone  (AMITIZA ) 24 MCG capsule Take 1 capsule (24 mcg total) by mouth 2 (two) times daily. 60 capsule 3   Melatonin 10 MG TABS Take 1 tablet every day by oral route at bedtime. (Patient not taking: Reported on 03/08/2024)     Multiple Vitamin (MULTIVITAMIN) tablet Take 1 tablet by mouth daily. (Patient not taking: Reported on 03/08/2024)     omeprazole  (PRILOSEC) 20 MG capsule Take 1 capsule (20 mg total) by mouth  2 (two) times daily. 60 capsule 2   ondansetron  (ZOFRAN -ODT) 8 MG disintegrating tablet Dissolve 1 tablet (8 mg total) by mouth every 8 (eight) hours as needed for nausea or vomiting. 20 tablet 6   ONETOUCH VERIO test strip Use to check glucose once daily. 100 each 3   OVER THE COUNTER MEDICATION Magnessium 500 mg at night     pregabalin  (LYRICA ) 200 MG capsule Take 1 capsule (200 mg total) by mouth 3 (three) times daily. 90 capsule 2   rosuvastatin  (CRESTOR ) 40 MG tablet Take 1 tablet (40 mg total) by mouth at bedtime. 90 tablet 1   sennosides-docusate sodium (SENOKOT-S) 8.6-50 MG tablet Take 2 tablets by mouth at bedtime.     tiZANidine  (ZANAFLEX ) 4 MG tablet Take 4 mg by mouth daily. 4mg  at  bedtime and 2 mg during the day     tiZANidine  (ZANAFLEX ) 4 MG tablet Take 1 tablet (4 mg total) by mouth every 12 (twelve) hours as needed. 60 tablet 11   UNABLE TO FIND 1 each by Does not apply route daily. Med Name: CPAP SUPPLIES Full face mask Cushion for mask -Medium ResMed 1 each 11   VITAMIN D  PO Take 5,000 Units by mouth daily.     zinc gluconate 50 MG tablet Take 50 mg by mouth daily.     No current facility-administered medications for this visit.     Musculoskeletal: Strength & Muscle Tone: within normal limits Gait & Station: normal Patient leans: N/A  Psychiatric Specialty Exam: Review of Systems  Musculoskeletal:  Positive for back pain and myalgias.  All other systems reviewed and are negative.   Last menstrual period 01/04/2018.There is no height or weight on file to calculate BMI.  General Appearance: Casual and Fairly Groomed  Eye Contact:  Good  Speech:  Clear and Coherent  Volume:  Normal  Mood:  Euthymic  Affect:  Congruent  Thought Process:  Goal Directed  Orientation:  Full (Time, Place, and Person)  Thought Content: WDL   Suicidal Thoughts:  No  Homicidal Thoughts:  No  Memory:  Immediate;   Good Recent;   Good Remote;   NA  Judgement:  Good  Insight:  Fair  Psychomotor Activity:  Decreased  Concentration:  Concentration: Good and Attention Span: Good  Recall:  Good  Fund of Knowledge: Good  Language: Good  Akathisia:  No  Handed:  Right  AIMS (if indicated): not done  Assets:  Communication Skills Desire for Improvement Resilience Social Support Talents/Skills  ADL's:  Intact  Cognition: WNL  Sleep:  Good   Screenings: GAD-7    Flowsheet Row Office Visit from 03/08/2024 in [facility] Telemedicine from 01/25/2024 in [facility] Office Visit from 12/05/2023 in [facility]  Total GAD-7 Score 13 10 10       Mini-Mental    Flowsheet Row Office Visit from 08/29/2019  in [facility]  Total Score (max 30 points ) 27      PHQ2-9    Flowsheet Row Office Visit from 03/08/2024 in [facility] Telemedicine from 01/25/2024 in [facility] Office Visit from 12/05/2023 in [facility] Video Visit from 11/04/2022 in [facility] at [place] Video Visit from 08/23/2022 in [facility] at [facility] Total Score 4 2 2 2  0  PHQ-9 Total Score 17 13 13 10  --  Flowsheet Row Admission (Discharged) from 02/16/2024 in [facility] Video Visit from 11/04/2022 in [facility] [place] Video Visit from 08/23/2022 in [facility] at [place]  C-SSRS RISK CATEGORY No Risk Error: Question 6 not populated No Risk        Assessment and Plan: This patient is a 58 year old female with a history of PTSD possible bipolar disorder depression anxiety and hallucinations in the past.  She also has chronic fatigue and an immune deficiency disorder.  She is again trying to get up for the sedating medicines.  We will cut down clonazepam  to 0.5 mg at bedtime.  She will continue Lamictal  25 mg twice daily for mood swings, Wellbutrin  XL 150 mg daily as well as Prozac  40 mg daily for depression.  She will return to see me in 3 months  Collaboration of Care: Collaboration of Care: Primary Care Provider AEB notes will be shared with PCP at patient's request  Patient/Guardian was advised Release of Information must be obtained prior to any record release in order to collaborate their care with an outside provider. Patient/Guardian was advised if they have not already done so to contact the registration department to sign all necessary forms in order for us  to release information regarding their care.   Consent: Patient/Guardian gives verbal consent for treatment and  assignment of benefits for services provided during this visit. Patient/Guardian expressed understanding and agreed to proceed.    Alfredia Annas, MD 04/04/2024, 1:14 PM

## 2024-04-05 ENCOUNTER — Other Ambulatory Visit (HOSPITAL_COMMUNITY): Payer: Self-pay

## 2024-04-05 ENCOUNTER — Other Ambulatory Visit: Payer: Self-pay

## 2024-04-08 ENCOUNTER — Other Ambulatory Visit: Payer: Self-pay

## 2024-04-09 ENCOUNTER — Other Ambulatory Visit (HOSPITAL_COMMUNITY): Payer: Self-pay

## 2024-04-09 ENCOUNTER — Other Ambulatory Visit: Payer: Self-pay

## 2024-04-09 DIAGNOSIS — S233XXA Sprain of ligaments of thoracic spine, initial encounter: Secondary | ICD-10-CM | POA: Diagnosis not present

## 2024-04-09 DIAGNOSIS — E119 Type 2 diabetes mellitus without complications: Secondary | ICD-10-CM | POA: Diagnosis not present

## 2024-04-09 DIAGNOSIS — M9901 Segmental and somatic dysfunction of cervical region: Secondary | ICD-10-CM | POA: Diagnosis not present

## 2024-04-09 DIAGNOSIS — H04123 Dry eye syndrome of bilateral lacrimal glands: Secondary | ICD-10-CM | POA: Diagnosis not present

## 2024-04-09 DIAGNOSIS — M9902 Segmental and somatic dysfunction of thoracic region: Secondary | ICD-10-CM | POA: Diagnosis not present

## 2024-04-09 DIAGNOSIS — H43813 Vitreous degeneration, bilateral: Secondary | ICD-10-CM | POA: Diagnosis not present

## 2024-04-09 DIAGNOSIS — M9903 Segmental and somatic dysfunction of lumbar region: Secondary | ICD-10-CM | POA: Diagnosis not present

## 2024-04-09 DIAGNOSIS — H18513 Endothelial corneal dystrophy, bilateral: Secondary | ICD-10-CM | POA: Diagnosis not present

## 2024-04-09 DIAGNOSIS — M47816 Spondylosis without myelopathy or radiculopathy, lumbar region: Secondary | ICD-10-CM | POA: Diagnosis not present

## 2024-04-09 DIAGNOSIS — F319 Bipolar disorder, unspecified: Secondary | ICD-10-CM | POA: Diagnosis not present

## 2024-04-09 DIAGNOSIS — R42 Dizziness and giddiness: Secondary | ICD-10-CM | POA: Diagnosis not present

## 2024-04-10 ENCOUNTER — Other Ambulatory Visit: Payer: Self-pay | Admitting: Internal Medicine

## 2024-04-10 ENCOUNTER — Other Ambulatory Visit (HOSPITAL_COMMUNITY): Payer: Self-pay

## 2024-04-10 ENCOUNTER — Other Ambulatory Visit: Payer: Self-pay

## 2024-04-10 MED ORDER — FLUTICASONE PROPIONATE 50 MCG/ACT NA SUSP
1.0000 | Freq: Every day | NASAL | 3 refills | Status: DC
  Filled 2024-04-10: qty 16, 60d supply, fill #0
  Filled 2024-04-25 – 2024-06-11 (×2): qty 16, 60d supply, fill #1
  Filled 2024-08-26: qty 16, 60d supply, fill #2
  Filled 2024-10-22: qty 16, 60d supply, fill #3

## 2024-04-11 DIAGNOSIS — H43811 Vitreous degeneration, right eye: Secondary | ICD-10-CM | POA: Diagnosis not present

## 2024-04-11 DIAGNOSIS — E1165 Type 2 diabetes mellitus with hyperglycemia: Secondary | ICD-10-CM | POA: Diagnosis not present

## 2024-04-11 DIAGNOSIS — H539 Unspecified visual disturbance: Secondary | ICD-10-CM | POA: Diagnosis not present

## 2024-04-11 DIAGNOSIS — H532 Diplopia: Secondary | ICD-10-CM | POA: Diagnosis not present

## 2024-04-11 DIAGNOSIS — G932 Benign intracranial hypertension: Secondary | ICD-10-CM | POA: Diagnosis not present

## 2024-04-12 ENCOUNTER — Other Ambulatory Visit (HOSPITAL_COMMUNITY): Payer: Self-pay

## 2024-04-15 ENCOUNTER — Other Ambulatory Visit: Payer: Self-pay

## 2024-04-15 ENCOUNTER — Other Ambulatory Visit (HOSPITAL_COMMUNITY): Payer: Self-pay

## 2024-04-16 ENCOUNTER — Encounter: Payer: Self-pay | Admitting: Family Medicine

## 2024-04-16 ENCOUNTER — Telehealth (INDEPENDENT_AMBULATORY_CARE_PROVIDER_SITE_OTHER): Payer: Self-pay | Admitting: Family Medicine

## 2024-04-16 ENCOUNTER — Other Ambulatory Visit: Payer: Self-pay

## 2024-04-16 DIAGNOSIS — R8290 Unspecified abnormal findings in urine: Secondary | ICD-10-CM | POA: Diagnosis not present

## 2024-04-16 DIAGNOSIS — R109 Unspecified abdominal pain: Secondary | ICD-10-CM | POA: Insufficient documentation

## 2024-04-16 DIAGNOSIS — D801 Nonfamilial hypogammaglobulinemia: Secondary | ICD-10-CM | POA: Diagnosis not present

## 2024-04-16 DIAGNOSIS — N1832 Chronic kidney disease, stage 3b: Secondary | ICD-10-CM

## 2024-04-16 NOTE — Progress Notes (Signed)
 Virtual Visit via Video Note  I connected with Michelle Clements on 04/16/24 at  8:40 AM EDT by a video enabled telemedicine application and verified that I am speaking with the correct person using two identifiers.  Location: Patient: home Provider: "office"   I discussed the limitations of evaluation and management by telemedicine and the availability of in person appointments. The patient expressed understanding and agreed to proceed.  History of Present Illness:   1 weeek h/o right flank pain rated at 3 , no fever, chills, change in appetite , intermittent nausea which is not uncommon, more related to low blood sugar, reportedly upper body movenmetnaggravates pain Malodorous urine x 10 days, h/o right kidney stone Concerned about high Hb and Creatinine recently obtained for pain management , record review shows no significant change in past 6 months , PCP to review  Observations/Objective: LMP 01/04/2018  Good communication with no confusion and intact memory. Alert and oriented x 3 No signs of respiratory distress during speech   Assessment and Plan:  Malodorous urine 10 day history with right flank pain and h/o kidney stone, needs CCUA and reflex c/s, push water , unless the CCUA is very abn hold antibiotic until c/S available  Right flank pain 1 week history rate at 4 dedicated US  to further evaluate, multiple co morbidities hTN , kidney stone history  Chronic kidney disease, stage 3b (HCC) Reviewed lab from 4/29, no significant change pt expressed concern re " high creatinine from 4/29 through Pain Specialist, will review with PCP at next visit , renal US  ordered  Follow Up Instructions:   f/U with new PCP as scheduled , call if you  eed to b seen sooner I discussed the assessment and treatment plan with the patient. The patient was provided an opportunity to ask questions and all were answered. The patient agreed with the plan and demonstrated an understanding of the  instructions.   The patient was advised to call back or seek an in-person evaluation if the symptoms worsen or if the condition fails to improve as anticipated.  I provided 20 minutes of non-face-to-face time during this encounter.   Alberteen Huge, MD

## 2024-04-16 NOTE — Assessment & Plan Note (Addendum)
 Reviewed lab from 4/29, no significant change pt expressed concern re " high creatinine from 4/29 through [facility], will review with PCP at next visit , renal US  ordered

## 2024-04-16 NOTE — Patient Instructions (Addendum)
 F/U with new PCP as scheduled , call if you need to be seen sooner  You need to submit urine for testing CCUA and C/S today if possible to check for UTI ( nurse will contact you regarding where you need to go to have lab done)  I have ordered an ultrasound of your kidneys, you will be contacted with appointment information  If CCUA is very abnormal I will prescribe a 5 day course of antibiotics, and still send urine for culture You need to continue to drink at least 64 ounces water daily and empty your bladder often  Lab review shows that your Hb has been elevated in the past so this is not new, you should discuss at your next visit with your PCP   You also have a dx of chronic kidney disease which causes the elevated creatinine. Waynard Hailstone control of your blood pressure and  blood sugar will help to protect your kidneys  Thanks for choosing [facility], we consider it a privelige to serve you.

## 2024-04-16 NOTE — Assessment & Plan Note (Signed)
 10 day history with right flank pain and h/o kidney stone, needs CCUA and reflex c/s, push water , unless the CCUA is very abn hold antibiotic until c/S available

## 2024-04-16 NOTE — Assessment & Plan Note (Deleted)
 DASH diet and commitment to daily physical activity for a minimum of 30 minutes discussed and encouraged, as a part of hypertension management. The importance of attaining a healthy weight is also discussed.     03/08/2024   10:50 AM 03/08/2024    9:19 AM 02/16/2024   11:29 AM 02/16/2024   10:17 AM 01/22/2024   10:00 AM 01/08/2024   10:13 AM 12/05/2023   10:02 AM  BP/Weight  Systolic BP 138 142 124 141 134 118 110  Diastolic BP 88 83 49 75 74 69 60  Wt. (Lbs) 229.8 231.6  229 236.77 241 239  BMI 32.05 kg/m2 32.3 kg/m2  31.94 kg/m2 33.02 kg/m2 33.61 kg/m2 33.33 kg/m2

## 2024-04-16 NOTE — Assessment & Plan Note (Signed)
 1 week history rate at 4 dedicated US  to further evaluate, multiple co morbidities hTN , kidney stone history

## 2024-04-17 ENCOUNTER — Other Ambulatory Visit: Payer: Self-pay

## 2024-04-17 ENCOUNTER — Ambulatory Visit: Admitting: Gastroenterology

## 2024-04-17 DIAGNOSIS — D801 Nonfamilial hypogammaglobulinemia: Secondary | ICD-10-CM

## 2024-04-17 DIAGNOSIS — R8290 Unspecified abnormal findings in urine: Secondary | ICD-10-CM | POA: Diagnosis not present

## 2024-04-17 DIAGNOSIS — D839 Common variable immunodeficiency, unspecified: Secondary | ICD-10-CM

## 2024-04-18 ENCOUNTER — Encounter: Payer: Self-pay | Admitting: Gastroenterology

## 2024-04-18 ENCOUNTER — Ambulatory Visit: Admitting: Gastroenterology

## 2024-04-18 ENCOUNTER — Ambulatory Visit: Payer: Self-pay | Admitting: Family Medicine

## 2024-04-18 VITALS — BP 130/68 | HR 72 | Temp 97.5°F | Ht 71.0 in | Wt 229.0 lb

## 2024-04-18 DIAGNOSIS — K5900 Constipation, unspecified: Secondary | ICD-10-CM

## 2024-04-18 DIAGNOSIS — R42 Dizziness and giddiness: Secondary | ICD-10-CM | POA: Diagnosis not present

## 2024-04-18 DIAGNOSIS — D801 Nonfamilial hypogammaglobulinemia: Secondary | ICD-10-CM | POA: Diagnosis not present

## 2024-04-18 DIAGNOSIS — M9901 Segmental and somatic dysfunction of cervical region: Secondary | ICD-10-CM | POA: Diagnosis not present

## 2024-04-18 DIAGNOSIS — M9902 Segmental and somatic dysfunction of thoracic region: Secondary | ICD-10-CM | POA: Diagnosis not present

## 2024-04-18 DIAGNOSIS — M9903 Segmental and somatic dysfunction of lumbar region: Secondary | ICD-10-CM | POA: Diagnosis not present

## 2024-04-18 DIAGNOSIS — K581 Irritable bowel syndrome with constipation: Secondary | ICD-10-CM

## 2024-04-18 DIAGNOSIS — K219 Gastro-esophageal reflux disease without esophagitis: Secondary | ICD-10-CM

## 2024-04-18 DIAGNOSIS — D839 Common variable immunodeficiency, unspecified: Secondary | ICD-10-CM | POA: Diagnosis not present

## 2024-04-18 DIAGNOSIS — M47816 Spondylosis without myelopathy or radiculopathy, lumbar region: Secondary | ICD-10-CM | POA: Diagnosis not present

## 2024-04-18 DIAGNOSIS — S233XXA Sprain of ligaments of thoracic spine, initial encounter: Secondary | ICD-10-CM | POA: Diagnosis not present

## 2024-04-18 LAB — UA/M W/RFLX CULTURE, ROUTINE
Bilirubin, UA: NEGATIVE
Glucose, UA: NEGATIVE
Ketones, UA: NEGATIVE
Leukocytes,UA: NEGATIVE
Nitrite, UA: NEGATIVE
Protein,UA: NEGATIVE
RBC, UA: NEGATIVE
Specific Gravity, UA: 1.012 (ref 1.005–1.030)
Urobilinogen, Ur: 0.2 mg/dL (ref 0.2–1.0)
pH, UA: 7.5 (ref 5.0–7.5)

## 2024-04-18 LAB — MICROSCOPIC EXAMINATION
Bacteria, UA: NONE SEEN
Casts: NONE SEEN /LPF
Epithelial Cells (non renal): NONE SEEN /HPF (ref 0–10)
RBC, Urine: NONE SEEN /HPF (ref 0–2)
WBC, UA: NONE SEEN /HPF (ref 0–5)

## 2024-04-18 NOTE — Progress Notes (Addendum)
 Gastroenterology Office Note     Primary Care Physician:  Tobi Fortes, MD  Primary Gastroenterologist: Dr. Sammi Crick    Chief Complaint   Chief Complaint  Patient presents with   Follow-up    Follow up from EGD pt doesn't know if its better     History of Present Illness   Michelle Clements is a 58 y.o. female presenting today with a history of CVID, depression, bipolar disorder, asthma, depression, GERD, IBS-C, PTSD, diabetes, interstitial cystitis, CKD,, fibromyalgia, returning in follow-up s/p EGD for choking, nausea, and abdominal pain.    EGD March 2025: empiric dilation, few gastric polyps s/p resection and retrieval of one, normal stomach s/p biopsy. Surveillance in 2 years for gastric mapping due to metaplasia.   Still occasional nausea but Neurologist was saying may be r/t migraines without obvious headache. No epigastric pain. No significant choking.   Up till 2-3 years ago had predominantly constipation chronically.   Chronic lower abdominal pain noted and stating "not friendly". Rumbles and carries on. Has had some fecal incontinence at night. Sometimes during the day.   On amitiza  24 mcg po BID with food and has alternating constipation and diarrhea. Has sticky stool and watery stool. She will have watery gush with urgency or accidents to no bowel movements for a few days, and will have a hard stool then soft again and sticky. Sometimes IgG injections will make her feel like worsens loose stool. Will use senokot and effective if using 2. Will take a day or 2 to kick in. Has taken Miralax  in the past. Linzess without improvement. Fiber many years.   Anorectal manometry upcoming. July 28th. In past has had to digitally remove. Will sometimes have to press on perinuem to have stool come out.   She is interested in a colonoscopy at this time.   GYN: not in 3 or 4 years. No rectal bleeding that she has seen.   Last Colonoscopy:Possibly 10 years ago, no report  available, patient reports it was normal.  Had a negative Cologuard on 08/14/2023   Past Medical History:  Diagnosis Date   Allergic rhinitis    Anemia    Arthritis    Lower back and hips   Asthma    Bipolar disorder (HCC)    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HCC)    Constipation    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HCC)    Depression    Essential hypertension, benign    Fibromyalgia    GERD (gastroesophageal reflux disease)    H/O sleep apnea    Hip pain, left    History of palpitations    Negative Holter monitor   History of pneumonia 1988   Hyperlipidemia    IBS (irritable bowel syndrome)    Nausea and vomiting 02/16/2024   Neck pain    Obstructive sleep apnea (adult) (pediatric) 04/12/2021   Poor short term memory    PTSD (post-traumatic stress disorder)    S/P Botox injection 11/2014    For migraine headaches   Type 2 diabetes mellitus (HCC)    Urgency of urination     Past Surgical History:  Procedure Laterality Date   CARPAL TUNNEL RELEASE Right 06/10/2013   Procedure: CARPAL TUNNEL RELEASE;  Surgeon: Augustine Blocker, MD;  Location: [facility];  Service: Neurosurgery;  Laterality: Right;  Right Carpal Tunnel Release    CHOLECYSTECTOMY     DENTAL SURGERY  12/2014   ESOPHAGOGASTRODUODENOSCOPY (EGD) WITH PROPOFOL  N/A 02/16/2024   Procedure:  ESOPHAGOGASTRODUODENOSCOPY (EGD) WITH PROPOFOL ;  Surgeon: Umberto Ganong, Bearl Limes, MD;  Location: [facility];  Service: Gastroenterology;  Laterality: N/A;  11:30am;ASA 1   mole exc     x 3   MUSCLE BIOPSY  2008   Right leg   POLYPECTOMY  02/16/2024   Procedure: POLYPECTOMY;  Surgeon: Umberto Ganong, Bearl Limes, MD;  Location: [facility];  Service: Gastroenterology;;   Dixie Frederickson DILATION  02/16/2024   Procedure: EGD, WITH DILATION USING SAVARY-GILLIARD DILATOR OVER GUIDEWIRE;  Surgeon: Umberto Ganong, Bearl Limes, MD;  Location: [facility];  Service: Gastroenterology;;   TUBAL LIGATION      Current Outpatient  Medications  Medication Sig Dispense Refill   acetaZOLAMIDE  (DIAMOX ) 250 MG tablet Take 1 tablet (250 mg total) by mouth daily for 14 days, THEN 1 tablet (250 mg total) 2 (two) times daily for 14 days, THEN 1 tablet (250 mg) in the morning and 2 tablets (500 mg) in the evening for 14 days, THEN 2 tablets (500 mg total) 2 (two) times daily. 180 tablet 3   albuterol  (VENTOLIN  HFA) 108 (90 Base) MCG/ACT inhaler Inhale 2 puffs into the lungs every 6 (six) hours as needed for wheezing and/or shortness of breath. 6.7 g 5   aspirin EC 81 MG tablet Take 1 tablet every day by oral route.     azelastine  (ASTELIN ) 0.1 % nasal spray Place 1 spray into both nostrils 2 (two) times daily. 30 mL 2   Blood Glucose Monitoring Suppl (ONETOUCH VERIO FLEX SYSTEM) w/Device KIT Use as directed to check blood glucose twice daily, before breakfast and before bed. 1 kit 0   Blood Glucose Monitoring Suppl (ONETOUCH VERIO) w/Device KIT Use to check glucose once daily 1 kit 0   buPROPion  (WELLBUTRIN  XL) 150 MG 24 hr tablet Take 1 tablet (150 mg total) by mouth every morning. 30 tablet 2   cetirizine  (ZYRTEC ) 10 MG tablet Take 10 mg by mouth at bedtime.     clonazePAM  (KLONOPIN ) 0.5 MG tablet Take 1 tablet (0.5 mg total) by mouth at bedtime. 30 tablet 2   Coenzyme Q10 (COQ10 PO) Take by mouth. One daily     Continuous Glucose Sensor (FREESTYLE LIBRE 3 PLUS SENSOR) MISC Change sensor every 15 days. 6 each 3   Continuous Glucose Sensor (FREESTYLE LIBRE 3 SENSOR) MISC Apply new sensor every 14 days to monitor blood glucose. Remove old sensor before applying new one. 6 each 3   cycloSPORINE  (RESTASIS ) 0.05 % ophthalmic emulsion Place 1 drop into both eyes 2 (two) times daily. 180 each 99   estradiol  (ESTRACE ) 0.1 MG/GM vaginal cream Place 1 Applicatorful vaginally 3 (three) times a week. 42.5 g 1   FLUoxetine  (PROZAC ) 40 MG capsule Take 1 capsule (40 mg total) by mouth daily. 30 capsule 2   fluticasone  (FLONASE ) 50 MCG/ACT nasal  spray Place 1 spray into both nostrils daily. 16 g 3   Galcanezumab -gnlm (EMGALITY ) 120 MG/ML SOSY Inject 1 mL into the skin every 30 (thirty) days. 1 mL 11   Immune Globulin , Human,-klhw (XEMBIFY ) 10 GM/50ML SOLN Inject 10 g into the skin every 7 (seven) days. 200 mL 3   insulin  glargine (LANTUS ) 100 UNIT/ML Solostar Pen Inject 40 Units into the skin at bedtime. 36 mL 3   ipratropium-albuterol  (DUONEB) 0.5-2.5 (3) MG/3ML SOLN Take 3 mLs by nebulization 2 (two) times daily as needed. 90 mL 2   lamoTRIgine  (LAMICTAL ) 25 MG tablet Take 1 tablet (25 mg total) by mouth 2 (two) times daily.  60 tablet 2   Lancets (ONETOUCH ULTRASOFT) lancets Use to check blood sugar once daily 100 each 3   Latanoprostene Bunod  (VYZULTA ) 0.024 % SOLN Place 1 drop into both eyes at bedtime as directed 7.5 mL 3   lidocaine  (LIDODERM ) 5 % Place 1 patch onto the skin daily, may wear up to 12 hours 30 patch 5   lubiprostone  (AMITIZA ) 24 MCG capsule Take 1 capsule (24 mcg total) by mouth 2 (two) times daily. 60 capsule 3   Melatonin 10 MG TABS      omeprazole  (PRILOSEC) 20 MG capsule Take 1 capsule (20 mg total) by mouth 2 (two) times daily. 60 capsule 2   ondansetron  (ZOFRAN -ODT) 8 MG disintegrating tablet Dissolve 1 tablet (8 mg total) by mouth every 8 (eight) hours as needed for nausea or vomiting. 20 tablet 6   ONETOUCH VERIO test strip Use to check glucose once daily. 100 each 3   OVER THE COUNTER MEDICATION Magnessium 500 mg at night     pregabalin  (LYRICA ) 200 MG capsule Take 1 capsule (200 mg total) by mouth 3 (three) times daily. 90 capsule 2   rosuvastatin  (CRESTOR ) 40 MG tablet Take 1 tablet (40 mg total) by mouth at bedtime. 90 tablet 1   sennosides-docusate sodium (SENOKOT-S) 8.6-50 MG tablet Take 2 tablets by mouth at bedtime.     tiZANidine  (ZANAFLEX ) 4 MG tablet Take 4 mg by mouth daily. 4mg  at bedtime and 2 mg during the day     tiZANidine  (ZANAFLEX ) 4 MG tablet Take 1 tablet (4 mg total) by mouth every 12  (twelve) hours as needed. 60 tablet 11   UNABLE TO FIND 1 each by Does not apply route daily. Med Name: CPAP SUPPLIES Full face mask Cushion for mask -Medium ResMed 1 each 11   VITAMIN D  PO Take 5,000 Units by mouth daily.     zinc gluconate 50 MG tablet Take 50 mg by mouth daily.     Glucos-Chond-Hyal Ac-Ca Fructo (MOVE FREE JOINT HEALTH ADVANCE PO) Take by mouth. (Patient not taking: Reported on 04/18/2024)     Multiple Vitamin (MULTIVITAMIN) tablet Take 1 tablet by mouth daily. (Patient not taking: Reported on 03/08/2024)     No current facility-administered medications for this visit.    Allergies as of 04/18/2024 - Review Complete 04/18/2024  Allergen Reaction Noted   Dust mite extract Cough, Other (See Comments), and Shortness Of Breath 09/17/2014   Molds & smuts Shortness Of Breath 09/17/2014   Sulfa antibiotics Anaphylaxis 09/17/2014   Trichophyton Shortness Of Breath 09/17/2014   Carbamazepine  Rash 06/03/2015   Gluten meal Itching 09/26/2014   Quetiapine fumarate Other (See Comments) 11/22/2022    Family History  Problem Relation Age of Onset   Fibromyalgia Mother    Diabetes type II Mother    Depression Mother    Alzheimer's disease Mother    GER disease Mother    Heart disease Mother    Paranoid behavior Mother    Dementia Mother    Heart attack Father 17       MI x5   Stroke Father        CVA x7   Asthma Father    Brain cancer Father    Seizures Father    Anxiety disorder Sister    OCD Sister    Sexual abuse Sister    Physical abuse Sister    Anxiety disorder Sister    ADD / ADHD Daughter    ADD / ADHD Daughter  Alcohol abuse Neg Hx    Drug abuse Neg Hx    Schizophrenia Neg Hx     Social History   Socioeconomic History   Marital status: Married    Spouse name: Not on file   Number of children: 2   Years of education: College   Highest education level: Associate degree: occupational, Scientist, product/process development, or vocational program  Occupational History    Occupation: Chiropractor: UNEMPLOYED    Comment: Now disabled due to bipolar and fibromyalgia  Tobacco Use   Smoking status: Former    Current packs/day: 0.00    Types: Cigarettes    Quit date: 11/04/2010    Years since quitting: 13.4    Passive exposure: Past   Smokeless tobacco: Never  Vaping Use   Vaping status: Never Used  Substance and Sexual Activity   Alcohol use: Yes    Comment: one mixed drink per month   Drug use: No   Sexual activity: Yes    Birth control/protection: Surgical  Other Topics Concern   Not on file  Social History Narrative   Married   Lives with spouse and daughter   Right handed.   Caffeine use: 2 cups coffee in morning and 2 cups tea during the day    Social Drivers of Health   Financial Resource Strain: Medium Risk (12/05/2023)   Overall Financial Resource Strain (CARDIA)    Difficulty of Paying Living Expenses: Somewhat hard  Food Insecurity: No Food Insecurity (12/05/2023)   Hunger Vital Sign    Worried About Running Out of Food in the Last Year: Never true    Ran Out of Food in the Last Year: Never true  Transportation Needs: No Transportation Needs (12/05/2023)   PRAPARE - Administrator, Civil Service (Medical): No    Lack of Transportation (Non-Medical): No  Physical Activity: Unknown (12/05/2023)   Exercise Vital Sign    Days of Exercise per Week: 0 days    Minutes of Exercise per Session: Not on file  Stress: Stress Concern Present (12/05/2023)   [employer] of Occupational Health - Occupational Stress Questionnaire    Feeling of Stress : To some extent  Social Connections: Unknown (12/05/2023)   Social Connection and Isolation Panel [NHANES]    Frequency of Communication with Friends and Family: Twice a week    Frequency of Social Gatherings with Friends and Family: Patient declined    Attends Religious Services: More than 4 times per year    Active Member of [employer] or Organizations: Yes    Attends Probation officer: More than 4 times per year    Marital Status: Married  Catering manager Violence: Unknown (03/10/2022)   Received from [employer], [facility]   HITS    Physically Hurt: Not on file    Insult or Talk Down To: Not on file    Threaten Physical Harm: Not on file    Scream or Curse: Not on file     Review of Systems   Gen: Denies any fever, chills, fatigue, weight loss, lack of appetite.  CV: Denies chest pain, heart palpitations, peripheral edema, syncope.  Resp: Denies shortness of breath at rest or with exertion. Denies wheezing or cough.  GI: Denies dysphagia or odynophagia. Denies jaundice, hematemesis, fecal incontinence. GU : Denies urinary burning, urinary frequency, urinary hesitancy MS: Denies joint pain, muscle weakness, cramps, or limitation of movement.  Derm: Denies rash, itching, dry skin Psych: Denies depression, anxiety, memory  loss, and confusion Heme: Denies bruising, bleeding, and enlarged lymph nodes.   Physical Exam   BP 130/68   Pulse 72   Temp (!) 97.5 F (36.4 C)   Ht 5\' 11"  (1.803 m)   Wt 229 lb (103.9 kg)   LMP 01/04/2018   BMI 31.94 kg/m  General:   Alert and oriented. Pleasant and cooperative. Well-nourished and well-developed.  Head:  Normocephalic and atraumatic. Eyes:  Without icterus Rectal:  small external hemorrhoid tag, fair sphincter tone, appears to have prominent internal hemorrhoids but no obvious mass appreciated, no anoscopy. Soft mushy stool in rectal vault, no impaction.  Msk:  Symmetrical without gross deformities. Normal posture. Extremities:  Without edema. Neurologic:  Alert and  oriented x4;  grossly normal neurologically. Skin:  Intact without significant lesions or rashes. Psych:  Alert and cooperative. Normal mood and affect.   Assessment   Michelle Clements is a 58 y.o. female presenting today with a history of  CVID, depression, bipolar disorder, asthma, depression, GERD, IBS-C, PTSD, diabetes,  interstitial cystitis, CKD,fibromyalgia, returning in follow-up s/p EGD for choking, nausea, and abdominal pain.   Choking: improved. Empiric dilation completed at time of EGD. Nausea improved as well and may be r/t migraines. GERD controlled on once daily PPI. Will need surveillance EGD in 2 years for gastric mapping due to gastric metaplasia.   IBS-C historically, but she seems to have an overflow incontinence presentation at this time. Nocturnal incontinence concerning. Rectal exam without obvious mass but did have mushy stool in rectal vault that precluded extensive exam. No anoscopy. Anorectal manometry upcoming in July. As colonoscopy at least 10 years ago, patient desires to pursue this, which is appropriate. Doubt any occult malignancy and suspect symptoms r/t pelvic floor dysfunction. Could also have rectocele contributing. Will continue on Amitiza  24 mcg BID and add fiber.       PLAN    Will keep on Amitiza  and add fiber supplement daily.  Will change to Ibsrela if no improvement with adding fiber: she will send mychart message Keep upcoming anorectal manometry appointment Proceed with colonoscopy by Dr. Sammi Crick in near future: the risks, benefits, and alternatives have been discussed with the patient in detail. The patient states understanding and desires to proceed.  3 month follow-up regardless   Delman Ferns, PhD, ANP-BC [facility] Gastroenterology   I have reviewed the note and agree with the APP's assessment as described in this progress note  Samantha Cress, MD Gastroenterology and Hepatology [facility] Gastroenterology

## 2024-04-18 NOTE — Patient Instructions (Addendum)
 I recommend adding Benefiber 2 teaspoons to beverage of your choice each morning. You can increase this to three times a day if tolerated!  Continue Amitiza  twice a day with food for now to avoid nausea. We can change to Ibsrela if you don't have improvement with adding fiber. Please message me with an update on how you are doing in about 2 weeks!  We are arranging a routine colonoscopy with Dr. Sammi Crick in the near future! You will take just 1/2 dose of Lantus  the night before. No insulin  the morning of the procedure.   Keep appointment at [facility] for the manometry.  We will see you back in 3 months!  I enjoyed seeing you again today! I value our relationship and want to provide genuine, compassionate, and quality care. You may receive a survey regarding your visit with me, and I welcome your feedback! Thanks so much for taking the time to complete this. I look forward to seeing you again.      Delman Ferns, PhD, ANP-BC [facility] Gastroenterology

## 2024-04-19 LAB — CBC WITH DIFFERENTIAL/PLATELET
Basophils Absolute: 0.1 10*3/uL (ref 0.0–0.2)
Basos: 1 %
EOS (ABSOLUTE): 0.2 10*3/uL (ref 0.0–0.4)
Eos: 2 %
Hematocrit: 46.1 % (ref 34.0–46.6)
Hemoglobin: 15.5 g/dL (ref 11.1–15.9)
Immature Grans (Abs): 0 10*3/uL (ref 0.0–0.1)
Immature Granulocytes: 0 %
Lymphocytes Absolute: 2.5 10*3/uL (ref 0.7–3.1)
Lymphs: 25 %
MCH: 32.4 pg (ref 26.6–33.0)
MCHC: 33.6 g/dL (ref 31.5–35.7)
MCV: 96 fL (ref 79–97)
Monocytes Absolute: 0.7 10*3/uL (ref 0.1–0.9)
Monocytes: 7 %
Neutrophils Absolute: 6.5 10*3/uL (ref 1.4–7.0)
Neutrophils: 65 %
Platelets: 284 10*3/uL (ref 150–450)
RBC: 4.79 x10E6/uL (ref 3.77–5.28)
RDW: 12.8 % (ref 11.7–15.4)
WBC: 9.9 10*3/uL (ref 3.4–10.8)

## 2024-04-19 LAB — COMPREHENSIVE METABOLIC PANEL WITH GFR
ALT: 39 IU/L — ABNORMAL HIGH (ref 0–32)
AST: 33 IU/L (ref 0–40)
Albumin: 4.6 g/dL (ref 3.8–4.9)
Alkaline Phosphatase: 91 IU/L (ref 44–121)
BUN/Creatinine Ratio: 14 (ref 9–23)
BUN: 18 mg/dL (ref 6–24)
Bilirubin Total: 0.5 mg/dL (ref 0.0–1.2)
CO2: 20 mmol/L (ref 20–29)
Calcium: 10.3 mg/dL — ABNORMAL HIGH (ref 8.7–10.2)
Chloride: 109 mmol/L — ABNORMAL HIGH (ref 96–106)
Creatinine, Ser: 1.3 mg/dL — ABNORMAL HIGH (ref 0.57–1.00)
Globulin, Total: 2.3 g/dL (ref 1.5–4.5)
Glucose: 85 mg/dL (ref 70–99)
Potassium: 4.9 mmol/L (ref 3.5–5.2)
Sodium: 143 mmol/L (ref 134–144)
Total Protein: 6.9 g/dL (ref 6.0–8.5)
eGFR: 48 mL/min/{1.73_m2} — ABNORMAL LOW (ref >59)

## 2024-04-19 LAB — IGG, IGA, IGM
IgA/Immunoglobulin A, Serum: 57 mg/dL — ABNORMAL LOW (ref 87–352)
IgG (Immunoglobin G), Serum: 733 mg/dL (ref 586–1602)
IgM (Immunoglobulin M), Srm: 11 mg/dL — ABNORMAL LOW (ref 26–217)

## 2024-04-22 ENCOUNTER — Inpatient Hospital Stay: Payer: PPO | Admitting: Physician Assistant

## 2024-04-24 ENCOUNTER — Other Ambulatory Visit: Payer: Self-pay | Admitting: Internal Medicine

## 2024-04-24 ENCOUNTER — Other Ambulatory Visit: Payer: Self-pay

## 2024-04-24 ENCOUNTER — Other Ambulatory Visit (HOSPITAL_COMMUNITY): Payer: Self-pay

## 2024-04-24 MED ORDER — OMEPRAZOLE 20 MG PO CPDR
20.0000 mg | DELAYED_RELEASE_CAPSULE | Freq: Two times a day (BID) | ORAL | 2 refills | Status: DC
  Filled 2024-05-02: qty 60, 30d supply, fill #0
  Filled 2024-05-30 – 2024-06-13 (×3): qty 60, 30d supply, fill #1
  Filled 2024-07-02 – 2024-07-16 (×4): qty 60, 30d supply, fill #2

## 2024-04-25 ENCOUNTER — Other Ambulatory Visit (HOSPITAL_BASED_OUTPATIENT_CLINIC_OR_DEPARTMENT_OTHER): Payer: Self-pay

## 2024-04-25 ENCOUNTER — Other Ambulatory Visit: Payer: Self-pay

## 2024-04-25 ENCOUNTER — Other Ambulatory Visit (HOSPITAL_COMMUNITY): Payer: Self-pay

## 2024-04-26 ENCOUNTER — Other Ambulatory Visit (HOSPITAL_BASED_OUTPATIENT_CLINIC_OR_DEPARTMENT_OTHER): Payer: Self-pay

## 2024-04-26 ENCOUNTER — Other Ambulatory Visit: Payer: Self-pay

## 2024-04-26 ENCOUNTER — Other Ambulatory Visit (HOSPITAL_COMMUNITY): Payer: Self-pay

## 2024-04-28 ENCOUNTER — Encounter: Payer: Self-pay | Admitting: Nurse Practitioner

## 2024-04-28 DIAGNOSIS — R768 Other specified abnormal immunological findings in serum: Secondary | ICD-10-CM

## 2024-04-28 DIAGNOSIS — R5382 Chronic fatigue, unspecified: Secondary | ICD-10-CM

## 2024-04-30 ENCOUNTER — Other Ambulatory Visit (HOSPITAL_COMMUNITY): Payer: Self-pay

## 2024-04-30 ENCOUNTER — Other Ambulatory Visit: Payer: Self-pay

## 2024-04-30 DIAGNOSIS — D801 Nonfamilial hypogammaglobulinemia: Secondary | ICD-10-CM | POA: Diagnosis not present

## 2024-05-02 ENCOUNTER — Other Ambulatory Visit (HOSPITAL_COMMUNITY): Payer: Self-pay

## 2024-05-02 ENCOUNTER — Other Ambulatory Visit: Payer: Self-pay | Admitting: Internal Medicine

## 2024-05-02 ENCOUNTER — Other Ambulatory Visit: Payer: Self-pay

## 2024-05-02 MED ORDER — ROSUVASTATIN CALCIUM 40 MG PO TABS
40.0000 mg | ORAL_TABLET | Freq: Every day | ORAL | 1 refills | Status: DC
  Filled 2024-05-02: qty 30, 30d supply, fill #0
  Filled 2024-05-30 – 2024-06-13 (×3): qty 30, 30d supply, fill #1
  Filled 2024-07-02 – 2024-07-16 (×4): qty 30, 30d supply, fill #2
  Filled 2024-08-13: qty 30, 30d supply, fill #3
  Filled 2024-09-10: qty 30, 30d supply, fill #4
  Filled 2024-10-14: qty 30, 30d supply, fill #5

## 2024-05-03 ENCOUNTER — Other Ambulatory Visit: Payer: Self-pay

## 2024-05-03 DIAGNOSIS — R42 Dizziness and giddiness: Secondary | ICD-10-CM | POA: Diagnosis not present

## 2024-05-03 DIAGNOSIS — M47816 Spondylosis without myelopathy or radiculopathy, lumbar region: Secondary | ICD-10-CM | POA: Diagnosis not present

## 2024-05-03 DIAGNOSIS — M9903 Segmental and somatic dysfunction of lumbar region: Secondary | ICD-10-CM | POA: Diagnosis not present

## 2024-05-03 DIAGNOSIS — S233XXA Sprain of ligaments of thoracic spine, initial encounter: Secondary | ICD-10-CM | POA: Diagnosis not present

## 2024-05-03 DIAGNOSIS — M9901 Segmental and somatic dysfunction of cervical region: Secondary | ICD-10-CM | POA: Diagnosis not present

## 2024-05-03 DIAGNOSIS — M9902 Segmental and somatic dysfunction of thoracic region: Secondary | ICD-10-CM | POA: Diagnosis not present

## 2024-05-05 DIAGNOSIS — J40 Bronchitis, not specified as acute or chronic: Secondary | ICD-10-CM

## 2024-05-05 HISTORY — DX: Bronchitis, not specified as acute or chronic: J40

## 2024-05-06 NOTE — Progress Notes (Signed)
 [facility] [address], [place] [address]   CLINIC:  Medical Oncology/Hematology  PCP:  Tobi Fortes, MD [address] [place] [address] [phone]   REASON FOR VISIT:  Follow-up for hypogammaglobulinemia  PRIOR THERAPY: Monthly IVIG  CURRENT THERAPY: Subcutaneous IgG (Xembify ) every 2 weeks  INTERVAL HISTORY:   Ms. Bittinger 58 y.o. female returns for routine follow-up of hypogammaglobulinemia.  She was last seen by Sheril Dines PA-C on 01/22/2024.    At today's visit, she reports feeling fair.  She continues to have some ongoing symptoms related to her fibromyalgia and other conditions - including body aches, headaches, and fatigue.  She is being worked up by rheumatology and is following with Dr. Lateef for her CKD stage IIIa.  She continues to tolerate subcutaneous Xembify  (every 2 weeks) much better than she was previously tolerating IVIG. She denies any major injection site reactions.  She has not had the severe weakness that she was experiencing after her IVIG.  She does sometimes have about 2-3 days of malaise following her Xembify  infusions, but for the most part she does not have any noticeable adverse effects.  She denies any bacterial infections or antibiotics within the past 3 months.  She reports that she and her husband both had a viral URI.  She denies any unexplained fevers, chills, night sweats, or weight loss.  She has 75% energy and 100% appetite.  She is maintaining a stable weight.  ASSESSMENT & PLAN:  1.  Hypogammaglobulinemia with recurrent infections - Labs from October 2015 showed IgG 566 [phone]), IgM 28 (57-237), IgA 65, IgE 12 - Evaluated by [facility] by Dr. Jesse Moritz (10/27/2014), who noted: " A review of Ms. Igoe immune evaluation reveals mild hypogammaglobulinemia (not significant), a low IgM but normal specific antibody titers, normal B cell subsets panels and normal cellular function. Taken  together the immune evaluation is normal. I do not believe Ms. Humphres has a functional disorder of her immune system. For the hypo-IgM this may become significant if the IgM was less that 15. For this I suggest she have an annual IgG, IgM and IgA. Currently the IgM level is not likely to be significant". 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 Rosaline Coma, with first dose on 11/03/2021 -No infections or antibiotics over the past 3 months.  No B symptoms. - She reported increasing weakness, fatigue, nausea, and myalgia/arthralgia after IVIG infusions (Gamunex-C 40 g/400 mL q 4 weeks). - Switched to subcutaneous IgG (Xembify  10g/15 mL SQ weekly) in June 2024, which she is tolerating better, although she does continue to have side effects of fatigue, headache, and diarrhea - Dose decreased to Xembify  10g/15 mL SQ every 2 weeks as of November 2024 to see if better tolerated.    - Most recent labs  (04/18/2024): Immunoglobulin troughs: IgG remains therapeutic at 733.  IgA (57) IgM (11) remain low, more or less at baseline. Normal CBC/D CMP with baseline CKD stage IIIa (creatinine 1.30/GFR 48).  Mild elevation in ALT at 39.   - Patient receives her home IVIG infusions via [facility] (contact [place] Sandy at [phone], fax [phone], email [email or web address]) - PLAN: Will check monthly quantitative immunoglobulin trough (day before Xembify ) along with CBC/D and CMP.   - Therapeutic goal is IgG around  600. - OFFICE visit in 3 months - Educational information regarding common side effects and risks has been provided to patient.  We discussed the importance of hydration due to risk of kidney injury.  2.  Hypercalcemia, mild - Labs from 04/18/2024 showed minimally elevated calcium  10.3 - She is taking daily  vitamin D  supplement (unable to recall if she is taking 5000 units daily or 2000 units daily) - Vitamin D  level from 10/27/2023 was normal at 74.7 - PLAN: Recommend DECREASING vitamin D  supplement to every other day to see if this will improve her calcium .  3.  Other history - Her past medical history is otherwise notable for fatty liver disease with history of transaminitis, GERD, fibromyalgia, type 2 diabetes mellitus, and IBS - CKD stage IIIa, follows with Dr. Erminio Hazy  PLAN SUMMARY:  >> Continue Xembify  every 2 weeks at home >> Monthly TROUGH labs to be obtained at [facility] = quantitative immunoglobulins, CBC/D, CMP >> OFFICE visit in 3 months     REVIEW OF SYSTEMS:   Review of Systems  Constitutional:  Positive for fatigue. Negative for appetite change, chills, diaphoresis, fever and unexpected weight change.  HENT:   Negative for lump/mass, nosebleeds and trouble swallowing.   Eyes:  Negative for eye problems.  Respiratory:  Positive for cough and shortness of breath. Negative for chest tightness, hemoptysis and wheezing.   Cardiovascular:  Negative for chest pain, leg swelling and palpitations.  Gastrointestinal:  Positive for diarrhea (IBS) and nausea. Negative for abdominal pain, blood in stool, constipation (IBS) and vomiting.  Genitourinary:  Negative for hematuria.   Musculoskeletal:  Positive for arthralgias, back pain and myalgias. Negative for neck pain.  Skin: Negative.   Neurological:  Positive for headaches (daily) and numbness. Negative for dizziness and light-headedness.  Hematological:  Does not bruise/bleed easily.  Psychiatric/Behavioral:  Positive for sleep disturbance. Negative for depression. The patient is not nervous/anxious.      PHYSICAL EXAM:  ECOG PERFORMANCE STATUS: 1 - Symptomatic but completely ambulatory  Vitals:   05/07/24 0902  BP: 127/68  Pulse: 74  Resp: 16  Temp: 98.2 F (36.8 C)  SpO2: 98%   Filed Weights   05/07/24 0902  Weight: 229  lb 0.9 oz (103.9 kg)   Physical Exam Constitutional:      Appearance: Normal appearance. She is obese.  Cardiovascular:     Heart sounds: Normal heart sounds.  Pulmonary:     Breath sounds: Normal breath sounds.  Neurological:     General: No focal deficit present.     Mental Status: Mental status is at baseline.  Psychiatric:        Behavior: Behavior normal. Behavior is cooperative.    PAST MEDICAL/SURGICAL HISTORY:  Past Medical History:  Diagnosis Date   Allergic rhinitis    Anemia    Arthritis    Lower back and hips   Asthma    Bipolar disorder (HCC)    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HCC)    Constipation    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HCC)    Depression    Essential hypertension, benign    Fibromyalgia    GERD (gastroesophageal reflux disease)    H/O sleep apnea    Hip pain, left    History of palpitations    Negative Holter monitor   History of pneumonia 1988   Hyperlipidemia    IBS (irritable bowel syndrome)    Nausea and vomiting 02/16/2024   Neck pain    Obstructive sleep apnea (adult) (pediatric)  04/12/2021   Poor short term memory    PTSD (post-traumatic stress disorder)    S/P Botox injection 11/2014    For migraine headaches   Type 2 diabetes mellitus (HCC)    Urgency of urination    Past Surgical History:  Procedure Laterality Date   CARPAL TUNNEL RELEASE Right 06/10/2013   Procedure: CARPAL TUNNEL RELEASE;  Surgeon: Augustine Blocker, MD;  Location: [facility];  Service: Neurosurgery;  Laterality: Right;  Right Carpal Tunnel Release    CHOLECYSTECTOMY     DENTAL SURGERY  12/2014   ESOPHAGOGASTRODUODENOSCOPY (EGD) WITH PROPOFOL  N/A 02/16/2024   Procedure: ESOPHAGOGASTRODUODENOSCOPY (EGD) WITH PROPOFOL ;  Surgeon: Urban Garden, MD;  Location: [facility];  Service: Gastroenterology;  Laterality: N/A;  11:30am;ASA 1   mole exc     x 3   MUSCLE BIOPSY  2008   Right leg   POLYPECTOMY  02/16/2024   Procedure: POLYPECTOMY;   Surgeon: Umberto Ganong, Bearl Limes, MD;  Location: [facility];  Service: Gastroenterology;;   SAVORY DILATION  02/16/2024   Procedure: EGD, WITH DILATION USING SAVARY-GILLIARD DILATOR OVER GUIDEWIRE;  Surgeon: Umberto Ganong, Bearl Limes, MD;  Location: [facility];  Service: Gastroenterology;;   TUBAL LIGATION      SOCIAL HISTORY:  Social History   Socioeconomic History   Marital status: Married    Spouse name: Not on file   Number of children: 2   Years of education: College   Highest education level: Associate degree: occupational, Scientist, product/process development, or vocational program  Occupational History   Occupation: Chiropractor: UNEMPLOYED    Comment: Now disabled due to bipolar and fibromyalgia  Tobacco Use   Smoking status: Former    Current packs/day: 0.00    Types: Cigarettes    Quit date: 11/04/2010    Years since quitting: 13.5    Passive exposure: Past   Smokeless tobacco: Never  Vaping Use   Vaping status: Never Used  Substance and Sexual Activity   Alcohol use: Yes    Comment: one mixed drink per month   Drug use: No   Sexual activity: Yes    Birth control/protection: Surgical  Other Topics Concern   Not on file  Social History Narrative   Married   Lives with spouse and daughter   Right handed.   Caffeine use: 2 cups coffee in morning and 2 cups tea during the day    Social Drivers of Health   Financial Resource Strain: Medium Risk (12/05/2023)   Overall Financial Resource Strain (CARDIA)    Difficulty of Paying Living Expenses: Somewhat hard  Food Insecurity: No Food Insecurity (12/05/2023)   Hunger Vital Sign    Worried About Running Out of Food in the Last Year: Never true    Ran Out of Food in the Last Year: Never true  Transportation Needs: No Transportation Needs (12/05/2023)   PRAPARE - Administrator, Civil Service (Medical): No    Lack of Transportation (Non-Medical): No  Physical Activity: Unknown (12/05/2023)   Exercise Vital Sign     Days of Exercise per Week: 0 days    Minutes of Exercise per Session: Not on file  Stress: Stress Concern Present (12/05/2023)   [employer] of Occupational Health - Occupational Stress Questionnaire    Feeling of Stress : To some extent  Social Connections: Unknown (12/05/2023)   Social Connection and Isolation Panel [NHANES]    Frequency of Communication with Friends and Family: Twice a week  Frequency of Social Gatherings with Friends and Family: Patient declined    Attends Religious Services: More than 4 times per year    Active Member of [employer] or Organizations: Yes    Attends Engineer, structural: More than 4 times per year    Marital Status: Married  Catering manager Violence: Unknown (03/10/2022)   Received from [employer], [facility]   HITS    Physically Hurt: Not on file    Insult or Talk Down To: Not on file    Threaten Physical Harm: Not on file    Scream or Curse: Not on file    FAMILY HISTORY:  Family History  Problem Relation Age of Onset   Fibromyalgia Mother    Diabetes type II Mother    Depression Mother    Alzheimer's disease Mother    GER disease Mother    Heart disease Mother    Paranoid behavior Mother    Dementia Mother    Heart attack Father 55       MI x5   Stroke Father        CVA x7   Asthma Father    Brain cancer Father    Seizures Father    Anxiety disorder Sister    OCD Sister    Sexual abuse Sister    Physical abuse Sister    Anxiety disorder Sister    ADD / ADHD Daughter    ADD / ADHD Daughter    Alcohol abuse Neg Hx    Drug abuse Neg Hx    Schizophrenia Neg Hx     CURRENT MEDICATIONS:  Outpatient Encounter Medications as of 05/07/2024  Medication Sig Note   acetaZOLAMIDE  (DIAMOX ) 250 MG tablet Take 1 tablet (250 mg total) by mouth daily for 14 days, THEN 1 tablet (250 mg total) 2 (two) times daily for 14 days, THEN 1 tablet (250 mg) in the morning and 2 tablets (500 mg) in the evening for 14 days, THEN 2  tablets (500 mg total) 2 (two) times daily.    albuterol  (VENTOLIN  HFA) 108 (90 Base) MCG/ACT inhaler Inhale 2 puffs into the lungs every 6 (six) hours as needed for wheezing and/or shortness of breath.    aspirin EC 81 MG tablet Take 1 tablet every day by oral route.    azelastine  (ASTELIN ) 0.1 % nasal spray Place 1 spray into both nostrils 2 (two) times daily.    Blood Glucose Monitoring Suppl (ONETOUCH VERIO FLEX SYSTEM) w/Device KIT Use as directed to check blood glucose twice daily, before breakfast and before bed.    Blood Glucose Monitoring Suppl (ONETOUCH VERIO) w/Device KIT Use to check glucose once daily    buPROPion  (WELLBUTRIN  XL) 150 MG 24 hr tablet Take 1 tablet (150 mg total) by mouth every morning.    cetirizine  (ZYRTEC ) 10 MG tablet Take 10 mg by mouth at bedtime.    clonazePAM  (KLONOPIN ) 0.5 MG tablet Take 1 tablet (0.5 mg total) by mouth at bedtime.    Coenzyme Q10 (COQ10 PO) Take by mouth. One daily    Continuous Glucose Sensor (FREESTYLE LIBRE 3 PLUS SENSOR) MISC Change sensor every 15 days.    Continuous Glucose Sensor (FREESTYLE LIBRE 3 SENSOR) MISC Apply new sensor every 14 days to monitor blood glucose. Remove old sensor before applying new one.    cycloSPORINE  (RESTASIS ) 0.05 % ophthalmic emulsion Place 1 drop into both eyes 2 (two) times daily.    estradiol  (ESTRACE ) 0.1 MG/GM vaginal cream Place 1 Applicatorful  vaginally 3 (three) times a week.    FLUoxetine  (PROZAC ) 40 MG capsule Take 1 capsule (40 mg total) by mouth daily.    fluticasone  (FLONASE ) 50 MCG/ACT nasal spray Place 1 spray into both nostrils daily.    Galcanezumab -gnlm (EMGALITY ) 120 MG/ML SOSY Inject 1 mL into the skin every 30 (thirty) days.    Immune Globulin , Human, (GAMUNEX-C) 20 GM/200ML SOLN     Immune Globulin , Human,-klhw (XEMBIFY ) 10 GM/50ML SOLN Inject 10 g into the skin every 7 (seven) days.    insulin  glargine (LANTUS ) 100 UNIT/ML Solostar Pen Inject 40 Units into the skin at bedtime.     ipratropium-albuterol  (DUONEB) 0.5-2.5 (3) MG/3ML SOLN Take 3 mLs by nebulization 2 (two) times daily as needed.    lamoTRIgine  (LAMICTAL ) 25 MG tablet Take 1 tablet (25 mg total) by mouth 2 (two) times daily.    Lancets (ONETOUCH ULTRASOFT) lancets Use to check blood sugar once daily    Latanoprostene Bunod  (VYZULTA ) 0.024 % SOLN Place 1 drop into both eyes at bedtime as directed    lidocaine  (LIDODERM ) 5 % Place 1 patch onto the skin daily, may wear up to 12 hours 01/08/2024: prn   lubiprostone  (AMITIZA ) 24 MCG capsule Take 1 capsule (24 mcg total) by mouth 2 (two) times daily.    Melatonin 10 MG TABS     omeprazole  (PRILOSEC) 20 MG capsule Take 1 capsule (20 mg total) by mouth 2 (two) times daily.    ondansetron  (ZOFRAN -ODT) 8 MG disintegrating tablet Dissolve 1 tablet (8 mg total) by mouth every 8 (eight) hours as needed for nausea or vomiting.    ONETOUCH VERIO test strip Use to check glucose once daily.    OVER THE COUNTER MEDICATION Magnessium 500 mg at night    pregabalin  (LYRICA ) 200 MG capsule Take 1 capsule (200 mg total) by mouth 3 (three) times daily.    Rimegepant Sulfate  (NURTEC) 75 MG TBDP     rosuvastatin  (CRESTOR ) 40 MG tablet Take 1 tablet (40 mg total) by mouth at bedtime.    sennosides-docusate sodium (SENOKOT-S) 8.6-50 MG tablet Take 2 tablets by mouth at bedtime.    tiZANidine  (ZANAFLEX ) 4 MG tablet Take 1 tablet (4 mg total) by mouth every 12 (twelve) hours as needed.    traMADol (ULTRAM) 50 MG tablet TAKE 1 TABLET EVERY 4 TO 6 HOURS BY MOUTH    traZODone  (DESYREL ) 50 MG tablet TAKE ONE TABLET BY MOUTH EVERYDAY AT BEDTIME    UNABLE TO FIND 1 each by Does not apply route daily. Med Name: CPAP SUPPLIES Full face mask Cushion for mask -Medium ResMed    VITAMIN D  PO Take 5,000 Units by mouth daily.    zinc gluconate 50 MG tablet Take 50 mg by mouth daily.    [DISCONTINUED] tiZANidine  (ZANAFLEX ) 4 MG tablet Take 4 mg by mouth daily. 4mg  at bedtime and 2 mg during the day  01/08/2024: 4mg  every night and additional 2mg  only if needed    [DISCONTINUED] rOPINIRole  (REQUIP ) 0.25 MG tablet Take 1 tablet (0.25 mg total) by mouth 2 (two) times daily.    [DISCONTINUED] SUMAtriptan  (IMITREX ) 100 MG tablet Take 1 tablet by mouth as needed for migraine. If migraine continues, may repeat dose after 2 hours.    No facility-administered encounter medications on file as of 05/07/2024.    ALLERGIES:  Allergies  Allergen Reactions   Dust Mite Extract Cough, Other (See Comments) and Shortness Of Breath    Wheezing  Other Reaction(s): Cough, Other (See  Comments)  Other reaction(s): Wheezing    Wheezing    Wheezing Other reaction(s): Wheezing Wheezing Wheezing Other reaction(s): Wheezing Wheezing Wheezing Other reaction(s): Wheezing Wheezing Wheezing Other reaction(s): Wheezing Wheezing Wheezing Other reaction(s): Wheezing Wheezing Wheezing Other reaction(s): Wheezing Wheezing Wheezing Other reaction(s): Wheezing Wheezing    Wheezing, Other reaction(s): Wheezing, Wheezing, Wheezing, Other reaction(s): Wheezing, Wheezing, Wheezing, Other reaction(s): Wheezing, Wheezing, Wheezing, Other reaction(s): Wheezing, Wheezing, Wheezing, Other reaction(s): Wheezing, Wheezing, Wheezing, Other reaction(s): Wheezing, Wheezing, Wheezing, Other reaction(s): Wheezing, Wheezing  Wheezing Other reaction(s): Wheezing Wheezing Wheezing Other reaction(s): Wheezing Wheezing Wheezing Other reaction(s): Wheezing Wheezing Wheezing Other reaction(s): Wheezing Wheezing Wheezing Other reaction(s): Wheezing Wheezing Wheezing Other reaction(s): Wheezing Wheezing Wheezing Other reaction(s): Wheezing Wheezing    Wheezing  Other Reaction(s): Cough, Other (See Comments)  Other reaction(s): Wheezing  Wheezing  Wheezing Other reaction(s): Wheezing Wheezing Wheezing Other reaction(s): Wheezing Wheezing Wheezing Other reaction(s): Wheezing Wheezing Wheezing Other reaction(s): Wheezing Wheezing Wheezing Other reaction(s):  Wheezing Wheezing Wheezing Other reaction(s): Wheezing Wheezing Wheezing Other reaction(s): Wheezing Wheezing  Wheezing, Other reaction(s): Wheezing, Wheezing, Wheezing, Other reaction(s): Wheezing, Wheezing, Wheezing, Other reaction(s): Wheezing, Wheezing, Wheezing, Other reaction(s): Wheezing, Wheezing, Wheezing, Other reaction(s): Wheezing, Wheezing, Wheezing, Other reaction(s): Wheezing, Wheezing, Wheezing, Other reaction(s): Wheezing, Wheezing    Wheezing Other reaction(s): Wheezing Wheezing    Wheezing    Other reaction(s): Wheezing  Wheezing  Wheezing Other reaction(s): Wheezing Wheezing Wheezing Other reaction(s): Wheezing Wheezing Wheezing Other reaction(s): Wheezing Wheezing Wheezing Other reaction(s): Wheezing Wheezing Wheezing Other reaction(s): Wheezing Wheezing Wheezing Other reaction(s): Wheezing Wheezing Wheezing Other reaction(s): Wheezing Wheezing  Whee... (TRUNCATED)  Wheezing Other reaction(s): Wheezing Wheezing   Gramineae Pollens Shortness Of Breath    wheezing   Misc. Sulfonamide Containing Compounds Anaphylaxis   Molds & Smuts Shortness Of Breath    wheezing  wheezing wheezing wheezing    wheezing, wheezing, wheezing  wheezing wheezing wheezing    wheezing  wheezing wheezing wheezing  wheezing, wheezing, wheezing    wheezing    wheezing wheezing wheezing  wheezing, wheezing, wheezing    wheezing, wheezing, wheezing   Other Anaphylaxis, Other (See Comments) and Shortness Of Breath    : Angioedema  wheezing  Wheezing  Other reaction(s): Cough  Itching and rash  Other reaction(s): Other (See Comments)   : Angioedema   Statins Anaphylaxis   Sulfa Antibiotics Anaphylaxis and Other (See Comments)    : Angioedema  : Angioedema  : Angioedema     : Angioedema    : Angioedema  : Angioedema   : Angioedema,  : Angioedema     : Angioedema,  : Angioedema   Sulfamethoxazole-Trimethoprim Anaphylaxis and Other (See Comments)   Trichophyton Shortness Of  Breath    wheezing  wheezing    wheezing wheezing wheezing wheezing wheezing wheezing wheezing wheezing wheezing wheezing wheezing wheezing wheezing wheezing wheezing wheezing wheezing    wheezing, wheezing, wheezing, wheezing, wheezing, wheezing, wheezing, wheezing, wheezing, wheezing, wheezing, wheezing, wheezing, wheezing, wheezing, wheezing, wheezing  wheezing wheezing wheezing wheezing wheezing wheezing wheezing wheezing wheezing wheezing wheezing wheezing wheezing wheezing wheezing wheezing wheezing    wheezing  wheezing  wheezing wheezing wheezing wheezing wheezing wheezing wheezing wheezing wheezing wheezing wheezing wheezing wheezing wheezing wheezing wheezing wheezing  wheezing, wheezing, wheezing, wheezing, wheezing, wheezing, wheezing, wheezing, wheezing, wheezing, wheezing, wheezing, wheezing, wheezing, wheezing, wheezing, wheezing    wheezing wheezing    wheezing  wheezing wheezing wheezing wheezing wheezing wheezing wheezing wheezing wheezing wheezing wheezing wheezing wheezing wheezing wheezing wheezing wheezing  wheezing, wheezing, wheezing, wheezing, wheezing, wheezing, wheezing,  wheezing, wheezing, wheezing, wheezing, wheezing, wheezing, wheezing, wheezing, wheezing, wheezing    wheezing, wheezing, wheezing, wheezing, wheezing, wheezing, wheezing, wheezing, wheezing, wheezing, wheezing, wheezing, wheezing, wheezing, wheezing, wheezing, wheezing   Carbamazepine  Rash, Dermatitis and Other (See Comments)    Other Reaction(s): Other (See Comments)   Gluten Meal Itching and Other (See Comments)    Itching and rash Other reaction(s): Abdominal Pain Itching and rash Itching and rash    Itching and rash    Other reaction(s): Abdominal Pain  Itching and rash  Itching and rash Other reaction(s): Abdominal Pain Itching and rash Itching and rash  Itching and rash, Other reaction(s): Abdominal Pain, Itching and rash, Itching and rash    Itching and rash, Other reaction(s): Abdominal  Pain, Itching and rash, Itching and rash   Quetiapine Other (See Comments)   Quetiapine Fumarate Other (See Comments)    LABORATORY DATA:  I have reviewed the labs as listed.  CBC    Component Value Date/Time   WBC 9.9 04/18/2024 1202   WBC 7.6 10/02/2023 0948   RBC 4.79 04/18/2024 1202   RBC 4.77 10/02/2023 0948   HGB 15.5 04/18/2024 1202   HCT 46.1 04/18/2024 1202   PLT 284 04/18/2024 1202   MCV 96 04/18/2024 1202   MCH 32.4 04/18/2024 1202   MCH 30.2 10/02/2023 0948   MCHC 33.6 04/18/2024 1202   MCHC 33.8 10/02/2023 0948   RDW 12.8 04/18/2024 1202   LYMPHSABS 2.5 04/18/2024 1202   MONOABS 0.6 10/02/2023 0948   EOSABS 0.2 04/18/2024 1202   BASOSABS 0.1 04/18/2024 1202      Latest Ref Rng & Units 04/18/2024   12:02 PM 03/05/2024    3:37 PM 12/05/2023   10:54 AM  CMP  Glucose 70 - 99 mg/dL 85  811  93   BUN 6 - 24 mg/dL 18  19  17    Creatinine 0.57 - 1.00 mg/dL 9.14  7.82  9.56   Sodium 134 - 144 mmol/L 143  142  141   Potassium 3.5 - 5.2 mmol/L 4.9  4.7  4.9   Chloride 96 - 106 mmol/L 109  105  103   CO2 20 - 29 mmol/L 20  23  23    Calcium  8.7 - 10.2 mg/dL 21.3  08.6  9.7   Total Protein 6.0 - 8.5 g/dL 6.9  6.7  6.3   Total Bilirubin 0.0 - 1.2 mg/dL 0.5  0.6  0.3   Alkaline Phos 44 - 121 IU/L 91  84  72   AST 0 - 40 IU/L 33  33  32   ALT 0 - 32 IU/L 39  37  36     DIAGNOSTIC IMAGING:  I have independently reviewed the relevant imaging and discussed with the patient.   WRAP UP:  All questions were answered. The patient knows to call the clinic with any problems, questions or concerns.  Medical decision making: Moderate  Time spent on visit: I spent 20 minutes counseling the patient face to face. The total time spent in the appointment was 30 minutes and more than 50% was on counseling.  Sonnie Dusky, PA-C  05/07/24 9:47 AM

## 2024-05-07 ENCOUNTER — Ambulatory Visit (HOSPITAL_COMMUNITY)
Admission: RE | Admit: 2024-05-07 | Discharge: 2024-05-07 | Disposition: A | Discharge status: Home / Self Care | Source: Ambulatory Visit | Attending: Family Medicine | Admitting: Family Medicine

## 2024-05-07 ENCOUNTER — Inpatient Hospital Stay: Attending: Physician Assistant | Admitting: Physician Assistant

## 2024-05-07 DIAGNOSIS — Z87891 Personal history of nicotine dependence: Secondary | ICD-10-CM | POA: Diagnosis not present

## 2024-05-07 DIAGNOSIS — M797 Fibromyalgia: Secondary | ICD-10-CM | POA: Diagnosis not present

## 2024-05-07 DIAGNOSIS — K219 Gastro-esophageal reflux disease without esophagitis: Secondary | ICD-10-CM | POA: Insufficient documentation

## 2024-05-07 DIAGNOSIS — I129 Hypertensive chronic kidney disease with stage 1 through stage 4 chronic kidney disease, or unspecified chronic kidney disease: Secondary | ICD-10-CM | POA: Insufficient documentation

## 2024-05-07 DIAGNOSIS — D839 Common variable immunodeficiency, unspecified: Secondary | ICD-10-CM

## 2024-05-07 DIAGNOSIS — N1832 Chronic kidney disease, stage 3b: Secondary | ICD-10-CM | POA: Insufficient documentation

## 2024-05-07 DIAGNOSIS — Z808 Family history of malignant neoplasm of other organs or systems: Secondary | ICD-10-CM | POA: Insufficient documentation

## 2024-05-07 DIAGNOSIS — N1831 Chronic kidney disease, stage 3a: Secondary | ICD-10-CM | POA: Insufficient documentation

## 2024-05-07 DIAGNOSIS — R519 Headache, unspecified: Secondary | ICD-10-CM | POA: Diagnosis not present

## 2024-05-07 DIAGNOSIS — R109 Unspecified abdominal pain: Secondary | ICD-10-CM | POA: Insufficient documentation

## 2024-05-07 DIAGNOSIS — K589 Irritable bowel syndrome without diarrhea: Secondary | ICD-10-CM | POA: Diagnosis not present

## 2024-05-07 DIAGNOSIS — E1122 Type 2 diabetes mellitus with diabetic chronic kidney disease: Secondary | ICD-10-CM | POA: Insufficient documentation

## 2024-05-07 DIAGNOSIS — D801 Nonfamilial hypogammaglobulinemia: Secondary | ICD-10-CM | POA: Diagnosis present

## 2024-05-07 DIAGNOSIS — N189 Chronic kidney disease, unspecified: Secondary | ICD-10-CM | POA: Diagnosis not present

## 2024-05-07 NOTE — Patient Instructions (Signed)
 [facility] at [facility] **VISIT SUMMARY & IMPORTANT INSTRUCTIONS **   You were seen today by Sheril Dines PA-C for your hypogammaglobulinemia (immunoglobulin deficiency).    SUBCUTANEOUS IgG We will continue your subcutaneous IgG (Xembify ) at the current dose. Overall, you seem to be tolerating your Xembify  better than the IVIG infusions. Common side effects of Xembify  include headache, fatigue, nausea, diarrhea, abdominal pain, injection site reactions, and fever. Risks associated with Xembify  include risk of kidney injury and renal failure, as well as the risk of blood clots in your legs or your lungs ("DVT or PE"). Make sure that you drink plenty of water to maintain excellent hydration while you are taking Xembify !  **I would like you to decrease your Vitamin D  to be taken every other day instead of daily, as it may be causing some high calcium  levels.  FOLLOW-UP APPOINTMENT: Office visit in 3 months  ** Thank you for trusting me with your healthcare!  I strive to provide all of my patients with quality care at each visit.  If you receive a survey for this visit, I would be so grateful to you for taking the time to provide feedback.  Thank you in advance!  ~ Adeleigh Barletta                   Dr. Paulett Boros   &   Sheril Dines, PA-C   - - - - - - - - - - - - - - - - - -    Thank you for choosing [facility] at [facility] to provide your oncology and hematology care.  To afford each patient quality time with our provider, please arrive at least 15 minutes before your scheduled appointment time.   If you have a lab appointment with the Cancer Center please come in thru the Main Entrance and check in at the main information desk.  You need to re-schedule your appointment should you arrive 10 or more minutes late.  We strive to give you quality time with our providers, and arriving late affects you and other patients whose  appointments are after yours.  Also, if you no show three or more times for appointments you may be dismissed from the clinic at the providers discretion.     Again, thank you for choosing [facility].  Our hope is that these requests will decrease the amount of time that you wait before being seen by our physicians.       _____________________________________________________________  Should you have questions after your visit to [facility], please contact our office at [phone] and follow the prompts.  Our office hours are 8:00 a.m. and 4:30 p.m. Monday - Friday.  Please note that voicemails left after 4:00 p.m. may not be returned until the following business day.  We are closed weekends and major holidays.  You do have access to a nurse 24-7, just call the main number to the clinic [phone] and do not press any options, hold on the line and a nurse will answer the phone.    For prescription refill requests, have your pharmacy contact our office and allow 72 hours.

## 2024-05-09 ENCOUNTER — Other Ambulatory Visit (HOSPITAL_COMMUNITY): Payer: Self-pay

## 2024-05-09 ENCOUNTER — Other Ambulatory Visit: Payer: Self-pay

## 2024-05-10 DIAGNOSIS — S233XXA Sprain of ligaments of thoracic spine, initial encounter: Secondary | ICD-10-CM | POA: Diagnosis not present

## 2024-05-10 DIAGNOSIS — M9901 Segmental and somatic dysfunction of cervical region: Secondary | ICD-10-CM | POA: Diagnosis not present

## 2024-05-10 DIAGNOSIS — M47816 Spondylosis without myelopathy or radiculopathy, lumbar region: Secondary | ICD-10-CM | POA: Diagnosis not present

## 2024-05-10 DIAGNOSIS — M9902 Segmental and somatic dysfunction of thoracic region: Secondary | ICD-10-CM | POA: Diagnosis not present

## 2024-05-10 DIAGNOSIS — R42 Dizziness and giddiness: Secondary | ICD-10-CM | POA: Diagnosis not present

## 2024-05-10 DIAGNOSIS — M9903 Segmental and somatic dysfunction of lumbar region: Secondary | ICD-10-CM | POA: Diagnosis not present

## 2024-05-13 ENCOUNTER — Other Ambulatory Visit: Payer: Self-pay

## 2024-05-13 ENCOUNTER — Other Ambulatory Visit (HOSPITAL_COMMUNITY): Payer: Self-pay

## 2024-05-14 ENCOUNTER — Other Ambulatory Visit (HOSPITAL_COMMUNITY): Payer: Self-pay

## 2024-05-14 DIAGNOSIS — N1831 Chronic kidney disease, stage 3a: Secondary | ICD-10-CM | POA: Diagnosis not present

## 2024-05-14 DIAGNOSIS — D801 Nonfamilial hypogammaglobulinemia: Secondary | ICD-10-CM | POA: Diagnosis not present

## 2024-05-14 DIAGNOSIS — E1122 Type 2 diabetes mellitus with diabetic chronic kidney disease: Secondary | ICD-10-CM | POA: Diagnosis not present

## 2024-05-14 DIAGNOSIS — R768 Other specified abnormal immunological findings in serum: Secondary | ICD-10-CM | POA: Diagnosis not present

## 2024-05-14 MED ORDER — DAPAGLIFLOZIN PROPANEDIOL 10 MG PO TABS
10.0000 mg | ORAL_TABLET | Freq: Every morning | ORAL | 11 refills | Status: AC
  Filled 2024-05-14 – 2024-05-20 (×6): qty 30, 30d supply, fill #0
  Filled 2024-05-30 – 2024-06-13 (×3): qty 30, 30d supply, fill #1
  Filled 2024-07-02 – 2024-07-16 (×4): qty 30, 30d supply, fill #2
  Filled 2024-08-13: qty 30, 30d supply, fill #3
  Filled 2024-09-10: qty 30, 30d supply, fill #4
  Filled 2024-10-14: qty 30, 30d supply, fill #5
  Filled 2024-11-05: qty 30, 30d supply, fill #6
  Filled 2024-12-09: qty 30, 30d supply, fill #7
  Filled 2025-01-08: qty 30, 30d supply, fill #8

## 2024-05-14 NOTE — Telephone Encounter (Signed)
Noted. Samples up front.

## 2024-05-14 NOTE — Telephone Encounter (Signed)
 Please put Ibsrela samples at front desk for patient. Take one twice a day, immediately before meals.

## 2024-05-15 ENCOUNTER — Other Ambulatory Visit (HOSPITAL_COMMUNITY): Payer: Self-pay

## 2024-05-15 ENCOUNTER — Other Ambulatory Visit: Payer: Self-pay

## 2024-05-15 ENCOUNTER — Encounter (HOSPITAL_COMMUNITY): Payer: Self-pay | Admitting: Hematology & Oncology

## 2024-05-16 ENCOUNTER — Other Ambulatory Visit (HOSPITAL_COMMUNITY): Payer: Self-pay

## 2024-05-16 DIAGNOSIS — M9903 Segmental and somatic dysfunction of lumbar region: Secondary | ICD-10-CM | POA: Diagnosis not present

## 2024-05-16 DIAGNOSIS — M9901 Segmental and somatic dysfunction of cervical region: Secondary | ICD-10-CM | POA: Diagnosis not present

## 2024-05-16 DIAGNOSIS — S233XXA Sprain of ligaments of thoracic spine, initial encounter: Secondary | ICD-10-CM | POA: Diagnosis not present

## 2024-05-16 DIAGNOSIS — M9902 Segmental and somatic dysfunction of thoracic region: Secondary | ICD-10-CM | POA: Diagnosis not present

## 2024-05-16 DIAGNOSIS — R42 Dizziness and giddiness: Secondary | ICD-10-CM | POA: Diagnosis not present

## 2024-05-16 DIAGNOSIS — M47816 Spondylosis without myelopathy or radiculopathy, lumbar region: Secondary | ICD-10-CM | POA: Diagnosis not present

## 2024-05-16 NOTE — Telephone Encounter (Signed)
Faxed for records

## 2024-05-17 ENCOUNTER — Other Ambulatory Visit (HOSPITAL_COMMUNITY): Payer: Self-pay

## 2024-05-17 ENCOUNTER — Other Ambulatory Visit: Payer: Self-pay

## 2024-05-18 ENCOUNTER — Other Ambulatory Visit (HOSPITAL_COMMUNITY): Payer: Self-pay

## 2024-05-18 ENCOUNTER — Other Ambulatory Visit (HOSPITAL_BASED_OUTPATIENT_CLINIC_OR_DEPARTMENT_OTHER): Payer: Self-pay

## 2024-05-20 ENCOUNTER — Other Ambulatory Visit: Payer: Self-pay

## 2024-05-21 ENCOUNTER — Other Ambulatory Visit: Payer: Self-pay

## 2024-05-23 ENCOUNTER — Ambulatory Visit: Payer: Self-pay

## 2024-05-23 ENCOUNTER — Telehealth (INDEPENDENT_AMBULATORY_CARE_PROVIDER_SITE_OTHER): Admitting: Internal Medicine

## 2024-05-23 VITALS — BP 139/77 | Ht 71.0 in | Wt 225.0 lb

## 2024-05-23 DIAGNOSIS — J45901 Unspecified asthma with (acute) exacerbation: Secondary | ICD-10-CM | POA: Diagnosis not present

## 2024-05-23 MED ORDER — METHYLPREDNISOLONE 4 MG PO TBPK: ORAL_TABLET | ORAL | 0 refills | Status: DC

## 2024-05-23 MED ORDER — AZITHROMYCIN 250 MG PO TABS: ORAL_TABLET | ORAL | 0 refills | Status: AC

## 2024-05-23 NOTE — Patient Instructions (Signed)
 Please start taking azithromycin  and prednisone  as prescribed.  Please continue using Trelegy regularly and use albuterol  as needed for shortness of breath or wheezing.  Please take Mucinex DM or Robitussin DM as needed for cough.

## 2024-05-23 NOTE — Progress Notes (Signed)
 Virtual Visit via Video Note   Because of Michelle Clements's co-morbid illnesses, she is at least at moderate risk for complications without adequate follow up.  This format is felt to be most appropriate for this patient at this time.  All issues noted in this document were discussed and addressed.  A limited physical exam was performed with this format.      Evaluation Performed:  Follow-up visit  Date:  05/23/2024   ID:  Michelle Clements, DOB 1965/12/09, MRN [id number]  Patient Location: Home Provider Location: Office/Clinic  Participants: Patient Location of Patient: Home Location of Provider: Telehealth Consent was obtain for visit to be over via telehealth. I verified that I am speaking with the correct person using two identifiers.  PCP:  Alison Irvine, FNP   Chief Complaint: Cough  History of Present Illness:    Michelle Clements is a 58 y.o. female with PMH of asthma who has a video visit for complaint of dry cough and recent worsening of dyspnea for the last 3 days.  She was exposed to her grandson, who had croup 5 days ago.  She has chills, but denies any fever.  She has tried taking OTC guaifenesin for cough with mild relief.  The patient does not have symptoms concerning for COVID-19 infection (fever, chills, cough, or new shortness of breath).   Past Medical, Surgical, Social History, Allergies, and Medications have been Reviewed.  Past Medical History:  Diagnosis Date   Allergic rhinitis    Anemia    Arthritis    Lower back and hips   Asthma    Bipolar disorder (HCC)    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HCC)    Constipation    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HCC)    Depression    Essential hypertension, benign    Fibromyalgia    GERD (gastroesophageal reflux disease)    H/O sleep apnea    Hip pain, left    History of palpitations    Negative Holter monitor   History of pneumonia 1988   Hyperlipidemia    IBS (irritable bowel syndrome)    Nausea and  vomiting 02/16/2024   Neck pain    Obstructive sleep apnea (adult) (pediatric) 04/12/2021   Poor short term memory    PTSD (post-traumatic stress disorder)    S/P Botox injection 11/2014    For migraine headaches   Type 2 diabetes mellitus (HCC)    Urgency of urination    Past Surgical History:  Procedure Laterality Date   CARPAL TUNNEL RELEASE Right 06/10/2013   Procedure: CARPAL TUNNEL RELEASE;  Surgeon: Augustine Blocker, MD;  Location: [facility];  Service: Neurosurgery;  Laterality: Right;  Right Carpal Tunnel Release    CHOLECYSTECTOMY     DENTAL SURGERY  12/2014   ESOPHAGOGASTRODUODENOSCOPY (EGD) WITH PROPOFOL  N/A 02/16/2024   Procedure: ESOPHAGOGASTRODUODENOSCOPY (EGD) WITH PROPOFOL ;  Surgeon: Urban Garden, MD;  Location: [facility];  Service: Gastroenterology;  Laterality: N/A;  11:30am;ASA 1   mole exc     x 3   MUSCLE BIOPSY  2008   Right leg   POLYPECTOMY  02/16/2024   Procedure: POLYPECTOMY;  Surgeon: Urban Garden, MD;  Location: [facility];  Service: Gastroenterology;;   Dixie Frederickson DILATION  02/16/2024   Procedure: EGD, WITH DILATION USING SAVARY-GILLIARD DILATOR OVER GUIDEWIRE;  Surgeon: Urban Garden, MD;  Location: [facility];  Service: Gastroenterology;;   TUBAL LIGATION       Current  Meds  Medication Sig   acetaZOLAMIDE  (DIAMOX ) 250 MG tablet Take 1 tablet (250 mg total) by mouth daily for 14 days, THEN 1 tablet (250 mg total) 2 (two) times daily for 14 days, THEN 1 tablet (250 mg) in the morning and 2 tablets (500 mg) in the evening for 14 days, THEN 2 tablets (500 mg total) 2 (two) times daily.   albuterol  (VENTOLIN  HFA) 108 (90 Base) MCG/ACT inhaler Inhale 2 puffs into the lungs every 6 (six) hours as needed for wheezing and/or shortness of breath.   aspirin EC 81 MG tablet Take 1 tablet every day by oral route.   azelastine  (ASTELIN ) 0.1 % nasal spray Place 1 spray into both nostrils 2 (two) times daily.   Blood Glucose  Monitoring Suppl (ONETOUCH VERIO FLEX SYSTEM) w/Device KIT Use as directed to check blood glucose twice daily, before breakfast and before bed.   Blood Glucose Monitoring Suppl (ONETOUCH VERIO) w/Device KIT Use to check glucose once daily   buPROPion  (WELLBUTRIN  XL) 150 MG 24 hr tablet Take 1 tablet (150 mg total) by mouth every morning.   cetirizine  (ZYRTEC ) 10 MG tablet Take 10 mg by mouth at bedtime.   clonazePAM  (KLONOPIN ) 0.5 MG tablet Take 1 tablet (0.5 mg total) by mouth at bedtime.   Coenzyme Q10 (COQ10 PO) Take by mouth. One daily   Continuous Glucose Sensor (FREESTYLE LIBRE 3 PLUS SENSOR) MISC Change sensor every 15 days.   Continuous Glucose Sensor (FREESTYLE LIBRE 3 SENSOR) MISC Apply new sensor every 14 days to monitor blood glucose. Remove old sensor before applying new one.   cycloSPORINE  (RESTASIS ) 0.05 % ophthalmic emulsion Place 1 drop into both eyes 2 (two) times daily.   dapagliflozin  propanediol (FARXIGA ) 10 MG TABS tablet Take 1 tablet (10 mg total) by mouth in the morning.   estradiol  (ESTRACE ) 0.1 MG/GM vaginal cream Place 1 Applicatorful vaginally 3 (three) times a week.   FLUoxetine  (PROZAC ) 40 MG capsule Take 1 capsule (40 mg total) by mouth daily.   fluticasone  (FLONASE ) 50 MCG/ACT nasal spray Place 1 spray into both nostrils daily.   Galcanezumab -gnlm (EMGALITY ) 120 MG/ML SOSY Inject 1 mL into the skin every 30 (thirty) days.   Immune Globulin , Human,-klhw (XEMBIFY ) 10 GM/50ML SOLN Inject 10 g into the skin every 7 (seven) days.   insulin  glargine (LANTUS ) 100 UNIT/ML Solostar Pen Inject 40 Units into the skin at bedtime.   ipratropium-albuterol  (DUONEB) 0.5-2.5 (3) MG/3ML SOLN Take 3 mLs by nebulization 2 (two) times daily as needed.   lamoTRIgine  (LAMICTAL ) 25 MG tablet Take 1 tablet (25 mg total) by mouth 2 (two) times daily.   Lancets (ONETOUCH ULTRASOFT) lancets Use to check blood sugar once daily   Latanoprostene Bunod  (VYZULTA ) 0.024 % SOLN Place 1 drop into  both eyes at bedtime as directed   lidocaine  (LIDODERM ) 5 % Place 1 patch onto the skin daily, may wear up to 12 hours   lubiprostone  (AMITIZA ) 24 MCG capsule Take 1 capsule (24 mcg total) by mouth 2 (two) times daily.   Melatonin 10 MG TABS    omeprazole  (PRILOSEC) 20 MG capsule Take 1 capsule (20 mg total) by mouth 2 (two) times daily.   ondansetron  (ZOFRAN -ODT) 8 MG disintegrating tablet Dissolve 1 tablet (8 mg total) by mouth every 8 (eight) hours as needed for nausea or vomiting.   ONETOUCH VERIO test strip Use to check glucose once daily.   OVER THE COUNTER MEDICATION Magnessium 500 mg at night   pregabalin  (LYRICA )  200 MG capsule Take 1 capsule (200 mg total) by mouth 3 (three) times daily.   rosuvastatin  (CRESTOR ) 40 MG tablet Take 1 tablet (40 mg total) by mouth at bedtime.   sennosides-docusate sodium (SENOKOT-S) 8.6-50 MG tablet Take 2 tablets by mouth at bedtime.   tiZANidine  (ZANAFLEX ) 4 MG tablet Take 1 tablet (4 mg total) by mouth every 12 (twelve) hours as needed.   traMADol (ULTRAM) 50 MG tablet TAKE 1 TABLET EVERY 4 TO 6 HOURS BY MOUTH   UNABLE TO FIND 1 each by Does not apply route daily. Med Name: CPAP SUPPLIES Full face mask Cushion for mask -Medium ResMed   VITAMIN D  PO Take 5,000 Units by mouth daily.   zinc gluconate 50 MG tablet Take 50 mg by mouth daily.     Allergies:   Dust mite extract, Gramineae pollens, Misc. sulfonamide containing compounds, Molds & smuts, Other, Statins, Sulfa antibiotics, Sulfamethoxazole-trimethoprim, Trichophyton, Carbamazepine , Gluten meal, Quetiapine, and Quetiapine fumarate   ROS:   Please see the history of present illness. All other systems reviewed and are negative.   Labs/Other Tests and Data Reviewed:    Recent Labs: 03/05/2024: TSH 1.500 04/18/2024: ALT 39; BUN 18; Creatinine, Ser 1.30; Hemoglobin 15.5; Platelets 284; Potassium 4.9; Sodium 143   Recent Lipid Panel Lab Results  Component Value Date/Time   CHOL 165  12/05/2023 10:58 AM   TRIG 165 (H) 12/05/2023 10:58 AM   HDL 49 12/05/2023 10:58 AM   CHOLHDL 3.4 12/05/2023 10:58 AM   CHOLHDL 4.2 03/18/2015 09:15 AM   LDLCALC 88 12/05/2023 10:58 AM    Wt Readings from Last 3 Encounters:  05/23/24 225 lb (102.1 kg)  05/07/24 229 lb 0.9 oz (103.9 kg)  04/18/24 229 lb (103.9 kg)     Objective:    Vital Signs:  Ht 5' 11 (1.803 m)   Wt 225 lb (102.1 kg)   LMP 01/04/2018   BMI 31.38 kg/m    VITAL SIGNS:  reviewed GEN:  no acute distress EYES:  sclerae anicteric, EOMI - Extraocular Movements Intact RESPIRATORY:  normal respiratory effort, symmetric expansion NEURO:  alert and oriented x 3, no obvious focal deficit PSYCH:  normal affect  ASSESSMENT & PLAN:    Asthma exacerbation, mild Recent worsening of cough and dyspnea likely due to asthma exacerbation Started Medrol  Dosepak Started empiric azithromycin  Continue Trelegy regularly and use albuterol  as needed for dyspnea or wheezing Advised to take Mucinex DM or Robitussin DM as needed for cough   I discussed the assessment and treatment plan with the patient. The patient was provided an opportunity to ask questions, and all were answered. The patient agreed with the plan and demonstrated an understanding of the instructions.   The patient was advised to call back or seek an in-person evaluation if the symptoms worsen or if the condition fails to improve as anticipated.  The above assessment and management plan was discussed with the patient. The patient verbalized understanding of and has agreed to the management plan.   Medication Adjustments/Labs and Tests Ordered: Current medicines are reviewed at length with the patient today.  Concerns regarding medicines are outlined above.   Tests Ordered: No orders of the defined types were placed in this encounter.   Medication Changes: No orders of the defined types were placed in this encounter.    Note: This dictation was prepared  with Dragon dictation along with smaller phrase technology. Similar sounding words can be transcribed inadequately or may not be corrected upon review.  Any transcriptional errors that result from this process are unintentional.      Disposition:  Follow up  Signed, Meldon Sport, MD  05/23/2024 4:38 PM     [facility]

## 2024-05-23 NOTE — Progress Notes (Deleted)
 a

## 2024-05-23 NOTE — Assessment & Plan Note (Signed)
 Recent worsening of cough and dyspnea likely due to asthma exacerbation Started Medrol  Dosepak Started empiric azithromycin  Continue Trelegy regularly and use albuterol  as needed for dyspnea or wheezing Advised to take Mucinex DM or Robitussin DM as needed for cough

## 2024-05-23 NOTE — Telephone Encounter (Signed)
 FYI Only or Action Required?: FYI only for provider.  Patient was last seen in primary care on 04/16/2024 by Towanda Fret, MD. Called Nurse Triage reporting Cough. Symptoms began several days ago. Interventions attempted: OTC medications: Tylenol , cough drops. Symptoms are: gradually improving (cough).  Triage Disposition: see HCP today Patient/caregiver understands and will follow disposition?: yes Pt unable to come into office. VV made       Copied from CRM [id number]. Topic: Clinical - Red Word Triage >> May 23, 2024  8:07 AM Rosamond Comes wrote: Red Word that prompted transfer to Nurse Triage: patient calling in,patient has Immunodeficiency,  has bad cough, body aches, headaches, sore throat, wheezing when breathing Reason for Disposition  Wheezing is present  Answer Assessment - Initial Assessment Questions 1. ONSET: When did the cough begin?      2 days ago  2. SEVERITY: How bad is the cough today?      Coughing less than 2 days - barky cough 3. SPUTUM: Describe the color of your sputum (none, dry cough; clear, white, yellow, green)     none 4. HEMOPTYSIS: Are you coughing up any blood? If so ask: How much? (flecks, streaks, tablespoons, etc.)     N/a 5. DIFFICULTY BREATHING: Are you having difficulty breathing? If Yes, ask: How bad is it? (e.g., mild, moderate, severe)    - MILD: No SOB at rest, mild SOB with walking, speaks normally in sentences, can lie down, no retractions, pulse < 100.    - MODERATE: SOB at rest, SOB with minimal exertion and prefers to sit, cannot lie down flat, speaks in phrases, mild retractions, audible wheezing, pulse 100-120.    - SEVERE: Very SOB at rest, speaks in single words, struggling to breathe, sitting hunched forward, retractions, pulse > 120      mild 6. FEVER: Do you have a fever? If Yes, ask: What is your temperature, how was it measured, and when did it start?     no 7. CARDIAC HISTORY: Do you have any history of heart  disease? (e.g., heart attack, congestive heart failure)      *No Answer* 8. LUNG HISTORY: Do you have any history of lung disease?  (e.g., pulmonary embolus, asthma, emphysema)     no 10. OTHER SYMPTOMS: Do you have any other symptoms? (e.g., runny nose, wheezing, chest pain)       Wheezing, body aches,headache, sore throat  Protocols used: Cough - Acute Non-Productive-A-AH

## 2024-05-23 NOTE — Telephone Encounter (Signed)
 Appt scheduled for today.

## 2024-05-24 ENCOUNTER — Telehealth: Payer: Self-pay

## 2024-05-24 NOTE — Telephone Encounter (Signed)
 Patient had video visit with dr patel on 6/19

## 2024-05-24 NOTE — Telephone Encounter (Signed)
 Copied from CRM [id number]. Topic: General - Other >> May 23, 2024  3:48 PM Marissa P wrote: Reason for CRM: Patient has been waiting since her appointment at 02:40pm today for someone to get on the video and no one connected or joined, contacted cal was on hold exceeding 5 minutes which patient was holding for awhile and just asked for a call back please to see what's going on [phone]

## 2024-05-28 ENCOUNTER — Other Ambulatory Visit: Payer: Self-pay

## 2024-05-28 DIAGNOSIS — M47816 Spondylosis without myelopathy or radiculopathy, lumbar region: Secondary | ICD-10-CM | POA: Diagnosis not present

## 2024-05-28 DIAGNOSIS — M9901 Segmental and somatic dysfunction of cervical region: Secondary | ICD-10-CM | POA: Diagnosis not present

## 2024-05-28 DIAGNOSIS — S233XXA Sprain of ligaments of thoracic spine, initial encounter: Secondary | ICD-10-CM | POA: Diagnosis not present

## 2024-05-28 DIAGNOSIS — R42 Dizziness and giddiness: Secondary | ICD-10-CM | POA: Diagnosis not present

## 2024-05-28 DIAGNOSIS — M9902 Segmental and somatic dysfunction of thoracic region: Secondary | ICD-10-CM | POA: Diagnosis not present

## 2024-05-28 DIAGNOSIS — M9903 Segmental and somatic dysfunction of lumbar region: Secondary | ICD-10-CM | POA: Diagnosis not present

## 2024-05-28 DIAGNOSIS — D801 Nonfamilial hypogammaglobulinemia: Secondary | ICD-10-CM | POA: Diagnosis not present

## 2024-05-30 ENCOUNTER — Other Ambulatory Visit: Payer: Self-pay

## 2024-05-30 ENCOUNTER — Other Ambulatory Visit (HOSPITAL_COMMUNITY): Payer: Self-pay

## 2024-05-31 ENCOUNTER — Encounter: Payer: Self-pay | Admitting: Internal Medicine

## 2024-05-31 ENCOUNTER — Ambulatory Visit: Attending: Internal Medicine | Admitting: Internal Medicine

## 2024-05-31 ENCOUNTER — Telehealth (HOSPITAL_COMMUNITY): Payer: Self-pay | Admitting: *Deleted

## 2024-05-31 VITALS — BP 124/72 | HR 78 | Resp 17 | Ht 71.0 in | Wt 222.8 lb

## 2024-05-31 DIAGNOSIS — R5383 Other fatigue: Secondary | ICD-10-CM | POA: Diagnosis not present

## 2024-05-31 DIAGNOSIS — M159 Polyosteoarthritis, unspecified: Secondary | ICD-10-CM | POA: Diagnosis not present

## 2024-05-31 DIAGNOSIS — M797 Fibromyalgia: Secondary | ICD-10-CM | POA: Diagnosis not present

## 2024-05-31 DIAGNOSIS — R768 Other specified abnormal immunological findings in serum: Secondary | ICD-10-CM | POA: Insufficient documentation

## 2024-05-31 NOTE — Telephone Encounter (Signed)
 Per patient due to her request she had stopped her sleeping medication. Per pt she would like for provider to give her something mild for sleeping please. Per pt she is not sleeping well. Per pt this has been about 10-14 days and she is getting about 2-3 hours pr night.

## 2024-05-31 NOTE — Patient Instructions (Signed)
 I recommend checking out the [place] [facility] [place] patient-centered guide for fibromyalgia and chronic pain management: [email or web address]

## 2024-05-31 NOTE — Progress Notes (Signed)
 Office Visit Note  Patient: Michelle Clements             Date of Birth: 1966-05-12           MRN: [id number]             PCP: Bevely Doffing, FNP Referring: Lateef, Munsoor, MD Visit Date: 05/31/2024   Subjective:  New Patient (Initial Visit) (Years of pain and abnormal labs )   Discussed the use of AI scribe software for clinical note transcription with the patient, who gave verbal consent to proceed.  History of Present Illness   Michelle Clements is a 58 year old female with fibromyalgia and CVID who presents with worsening joint and muscle pain with abnormal lab results including a positive ANA.  She has a long-standing history of fibromyalgia, diagnosed at age 81, characterized by body aches and pains. Over the past few years, she has noticed significant muscle wasting, particularly in her arms and legs, and a decrease in strength. Joint pain has worsened, affecting her hands, elbows, shoulders, neck, spine, hips, knees, and ankles. Her thumb knuckle is described as 'horrible' despite a previous trigger release surgery. She experiences difficulty gripping and holding objects, and her elbows are sore on the inside. Her feet have a burning, swollen sensation, attributed to neuropathy.  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 She recently experienced a severe episode of bronchitis with asthma exacerbation, treated with antibiotics and a Medrol  pack, which she completed two days ago. She reports occasional colds but no other recent serious infections.  Her medication regimen includes Lyrica  for fibromyalgia pain and fluoxetine , which she has been on for several years. She does not take maintenance steroids but recently completed a course of methylprednisolone  for bronchitis and asthma.  No new skin rashes, abnormal bleeding, or significant lymph node swelling. She  experiences poor sleep, waking frequently despite taking over-the-counter melatonin and valerian root supplements. She reports a change from chronic constipation to severe diarrhea recently.  Her social history includes a diet she describes as 'pretty good,' with a guilty pleasure of kettle corn popcorn. She drinks a lot of water and eats salmon three times a week.   No raynaud's symptoms. No photosensitive skin eruptions. No history of blood clots or abnormal bleeding.     Labs reviewed ANA 1:80 DFS RNP 2.8 Rest 11 Ab panel neg  Activities of Daily Living:  Patient reports morning stiffness for 6-7 hours.   Patient Reports nocturnal pain.  Difficulty dressing/grooming: Reports Difficulty climbing stairs: Reports Difficulty getting out of chair: Reports Difficulty using hands for taps, buttons, cutlery, and/or writing: Reports  Review of Systems  Constitutional:  Positive for fatigue.  HENT:  Positive for mouth dryness. Negative for mouth sores.   Eyes:  Positive for dryness.  Respiratory:  Positive for shortness of breath.   Cardiovascular:  Positive for chest pain and palpitations.  Gastrointestinal:  Positive for diarrhea. Negative for blood in stool and constipation.  Endocrine: Positive for increased urination.  Genitourinary:  Positive for involuntary urination.  Musculoskeletal:  Positive for joint pain, gait problem, joint pain, joint swelling, myalgias, muscle weakness, morning stiffness, muscle tenderness and myalgias.  Skin:  Positive for color change, rash and sensitivity to sunlight. Negative for hair loss.  Allergic/Immunologic: Positive for susceptible to infections.  Neurological:  Positive for dizziness and headaches.  Hematological:  Negative for  swollen glands.  Psychiatric/Behavioral:  Positive for depressed mood and sleep disturbance. The patient is nervous/anxious.     PMFS History:  Patient Active Problem List   Diagnosis Date Noted   Positive ANA  (antinuclear antibody) 05/31/2024   Malodorous urine 04/16/2024   Right flank pain 04/16/2024   Breast cancer screening by mammogram 03/08/2024   Right-sided chest wall pain 03/08/2024   Nausea and vomiting 02/16/2024   Asthma exacerbation, mild 01/25/2024   Choking 01/08/2024   Dysphagia 01/08/2024   Incomplete defecation 01/08/2024   Chronic pain syndrome 12/17/2023   Chronic back pain 12/17/2023   Anxiety and depression 12/17/2023   Vaginal atrophy 12/17/2023   Osteoarthritis of sacroiliac region (HCC) 12/05/2023   Trigger thumb of right hand 11/11/2023   Intractable chronic migraine without aura 07/25/2023   Recurrent falls 06/27/2023   Abnormal gait 03/22/2023   Osteoarthritis 02/03/2023   Restless legs 02/03/2023   Hyperkalemia 11/21/2022   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) 11/21/2022   Multiple joint pain 11/09/2022   Insomnia 07/19/2022   Dysphonia 04/06/2022   Elevated blood-pressure reading without diagnosis of hypertension 12/31/2021   Galactorrhea not associated with childbirth 10/13/2021   Major depressive disorder, recurrent episode, severe (HCC) 08/27/2021   Suicidal overdose, subsequent encounter 08/11/2021   Chronic kidney disease, stage 3b (HCC) 07/12/2021   Type 2 diabetes mellitus with diabetic chronic kidney disease (HCC) 07/12/2021   Fatigue 07/12/2021   Increased frequency of urination 06/02/2021   Moderate persistent asthma 05/05/2021   Memory impairment 05/05/2021   Sleep terror disorder 04/30/2021   Non-alcoholic fatty liver disease 04/30/2021   Hypercalcemia 04/30/2021   Generalized anxiety disorder 04/30/2021   Dysuria 04/30/2021   Iron deficiency anemia 04/12/2021   Immunodeficiency disorder (HCC) 04/12/2021   Hypothyroidism 04/12/2021   Obstructive sleep apnea (adult) (pediatric) 04/12/2021   Primary osteoarthritis of left hip 09/25/2018   Trochanteric bursitis, left hip 09/25/2018   Cervical spondylosis with  radiculopathy 05/31/2016   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   Migraine 10/28/2014   Neck pain of over 3 months duration 10/28/2014   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   Headache 11/06/2013   Cervicalgia 11/06/2013   Acute confusional state 11/06/2013   History of stroke in prior 3 months 10/15/2013   Carpal tunnel syndrome 05/17/2013   Neuropathy of upper extremity 04/23/2013   Constipation 06/30/2009   Vitamin D  deficiency 02/13/2009   Morbid obesity (HCC) 02/13/2009   BIPOLAR AFFECTIVE DISORDER 11/19/2007   Essential hypertension, benign 11/16/2007   Mixed hyperlipidemia 10/03/2007   Hx of tobacco use, presenting hazards to health 10/03/2007   DEPRESSION 10/03/2007   IBS 10/03/2007   Fibromyalgia 10/03/2007   Palpitations 10/03/2007    Past Medical History:  Diagnosis Date   Allergic rhinitis    Anemia    Arthritis    Lower back and hips   Asthma    Bipolar disorder (HCC)    Bronchitis 05/2024   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HCC)    Constipation    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HCC)    Depression    Essential hypertension, benign    Fibromyalgia    GERD (gastroesophageal reflux disease)    H/O sleep apnea    Hip pain, left    History of palpitations    Negative Holter monitor   History of pneumonia 12/05/1986   Hyperlipidemia    IBS (irritable bowel syndrome)    Nausea and vomiting 02/16/2024   Neck pain    Obstructive  sleep apnea (adult) (pediatric) 04/12/2021   Poor short term memory    PTSD (post-traumatic stress disorder)    S/P Botox injection 11/04/2014   For migraine headaches   Type 2 diabetes mellitus (HCC)    Urgency of urination     Family History  Problem Relation Age of Onset   Fibromyalgia Mother    Diabetes type II Mother    Depression Mother    Alzheimer's disease Mother    GER disease Mother    Heart disease Mother    Paranoid behavior Mother    Dementia Mother    Heart attack Father 30       MI x5    Stroke Father        CVA x7   Asthma Father    Brain cancer Father    Seizures Father    Anxiety disorder Sister    OCD Sister    Sexual abuse Sister    Physical abuse Sister    Anxiety disorder Sister    Fibromyalgia Sister    Other Sister        Temporomandibular Joint   ADD / ADHD Daughter    ADD / ADHD Daughter    Alcohol abuse Neg Hx    Drug abuse Neg Hx    Schizophrenia Neg Hx    Past Surgical History:  Procedure Laterality Date   CARPAL TUNNEL RELEASE Right 06/10/2013   Procedure: CARPAL TUNNEL RELEASE;  Surgeon: Darina MALVA Boehringer, MD;  Location: [facility];  Service: Neurosurgery;  Laterality: Right;  Right Carpal Tunnel Release    CHOLECYSTECTOMY     DENTAL SURGERY  12/05/2014   ESOPHAGOGASTRODUODENOSCOPY (EGD) WITH PROPOFOL  N/A 02/16/2024   Procedure: ESOPHAGOGASTRODUODENOSCOPY (EGD) WITH PROPOFOL ;  Surgeon: Eartha Angelia Sieving, MD;  Location: [facility];  Service: Gastroenterology;  Laterality: N/A;  11:30am;ASA 1   mole exc     x 3   MUSCLE BIOPSY  12/05/2006   Right leg   POLYPECTOMY  02/16/2024   Procedure: POLYPECTOMY;  Surgeon: Eartha Angelia Sieving, MD;  Location: [facility];  Service: Gastroenterology;;   HARLEY DILATION  02/16/2024   Procedure: EGD, WITH DILATION USING SAVARY-GILLIARD DILATOR OVER GUIDEWIRE;  Surgeon: Eartha Angelia Sieving, MD;  Location: [facility];  Service: Gastroenterology;;   TRIGGER FINGER RELEASE Right 03/2024   thumb   TUBAL LIGATION     Social History   Social History Narrative   Married   Lives with spouse and daughter   Right handed.   Caffeine use: 2 cups coffee in morning and 2 cups tea during the day    Immunization History  Administered Date(s) Administered   Fluzone Influenza virus vaccine,trivalent (IIV3), split virus 09/24/2009   Influenza Whole 10/03/2007   Influenza,inj,Quad PF,6+ Mos 10/03/2018, 09/01/2021   Influenza,inj,quad, With Preservative 09/26/2019   Influenza-Unspecified  09/18/2014, 09/23/2015, 09/04/2017   Novel Infuenza-h1n1-09 01/26/2009   Pneumococcal Polysaccharide-23 10/14/2012   Td 01/21/2019     Objective: Vital Signs: BP 124/72 (BP Location: Left Arm, Patient Position: Sitting, Cuff Size: Normal) Comment (BP Location): lower arm due to glucose monitior  Pulse 78   Resp 17   Ht 5' 11 (1.803 m)   Wt 222 lb 12.8 oz (101.1 kg)   LMP 01/04/2018   BMI 31.07 kg/m    Physical Exam  Eyes:     Conjunctiva/sclera: Conjunctivae normal.    Cardiovascular:     Rate and Rhythm: Normal rate and regular rhythm.  Pulmonary:  Effort: Pulmonary effort is normal.     Breath sounds: Normal breath sounds.     Comments: R>L expiratory wheezes  Musculoskeletal:     Right lower leg: No edema.     Left lower leg: No edema.  Lymphadenopathy:     Cervical: No cervical adenopathy.   Skin:    General: Skin is warm and dry.     Findings: No rash.   Neurological:     Mental Status: She is alert.   Psychiatric:        Mood and Affect: Mood normal.      Musculoskeletal Exam:  Neck and upper back muscle tenderness to pressure, increase muscle tone Elbows full ROM no tenderness or swelling Wrists full ROM no tenderness or swelling Right thumb partially contracted, other fingers full ROM no tenderness or swelling Hip internal and external rotation provoking mild lateral pain, lateral tenderness to pressure in both hips Knees full ROM no tenderness or swelling   Investigation: No additional findings.  Imaging: US  Renal Result Date: 05/13/2024 CLINICAL DATA:  flank pain, CKD, h/o kidney stones. HTN and diabetes EXAM: RENAL / URINARY TRACT ULTRASOUND COMPLETE COMPARISON:  September 04, 2018 FINDINGS: Right Kidney: Renal measurements: 11 x 5.3 x 6.5 cm = volume: 199 mL. In echogenicity. No mass. No hydronephrosis or nephrolithiasis. Left Kidney: Renal measurements: 12 x 5.8 x 5 cm = volume: 180 mL. Normal echogenicity. No mass. No hydronephrosis or  nephrolithiasis. Bladder: Appears normal for degree of bladder distention. Other: None. IMPRESSION: No hydronephrosis or nephrolithiasis. Electronically Signed   By: Rogelia Myers M.D.   On: 05/13/2024 20:22    Recent Labs: Lab Results  Component Value Date   WBC 9.9 04/18/2024   HGB 15.5 04/18/2024   PLT 284 04/18/2024   NA 143 04/18/2024   K 4.9 04/18/2024   CL 109 (H) 04/18/2024   CO2 20 04/18/2024   GLUCOSE 85 04/18/2024   BUN 18 04/18/2024   CREATININE 1.30 (H) 04/18/2024   BILITOT 0.5 04/18/2024   ALKPHOS 91 04/18/2024   AST 33 04/18/2024   ALT 39 (H) 04/18/2024   PROT 6.9 04/18/2024   ALBUMIN 4.6 04/18/2024   CALCIUM  10.3 (H) 04/18/2024   GFRAA 50 12/29/2020    Speciality Comments: No specialty comments available.  Procedures:  No procedures performed Allergies: Dust mite extract, Gramineae pollens, Misc. sulfonamide containing compounds, Molds & smuts, Other, Statins, Sulfa antibiotics, Sulfamethoxazole-trimethoprim, Trichophyton, Carbamazepine , Gluten meal, Quetiapine, and Quetiapine fumarate   Assessment / Plan:     Visit Diagnoses: Positive ANA (antinuclear antibody) - Plan: C3 and C4, CK, RNP Antibody, Sedimentation rate CVID managed with subcutaneous immunoglobulin may be an effect on Positive ANA and RNP antibody likely although apparently with abnormal serology since years ago. Discussed autoimmunity treatment challenges due to infection risk with immunosuppressive therapy. No definite inflammatory arthritis on exam. No clinical criteria for SLE and borderline serology so will repeat in additional to inflammatory markers. - Recheck antibody markers and inflammatory markers.  Fibromyalgia Chronic fibromyalgia with muscle wasting and increased joint pain. Chronic myofascial pain with muscle irritation and sensitivity. Overlap with fibromyalgia symptoms. On Lyrica  and fluoxetine . Discussed overlap with myofascial pain syndrome. - Provide educational materials on  fibromyalgia and myofascial pain syndrome, including non-pharmacological management. - Recommend review fibroguide resource - Recheck CK level to assess muscle inflammation or atrophy.  Spondylosis Chronic neck and spine issues with spondylosis. Previous injections ineffective. Advised against NSAIDs and limited Tylenol  use due to liver concerns. - Advise  on safe Tylenol  use, not exceeding 3 grams per 24 hours.  Asthma with recent exacerbation Recent exacerbation treated with Medrol  pack. No current maintenance steroid use.   Orders: Orders Placed This Encounter  Procedures   C3 and C4   CK   RNP Antibody   Sedimentation rate   No orders of the defined types were placed in this encounter.   Follow-Up Instructions: No follow-ups on file.   Lonni LELON Ester, MD  Note - This record has been created using [employer].  Chart creation errors have been sought, but may not always  have been located. Such creation errors do not reflect on  the standard of medical care.

## 2024-06-01 LAB — C3 AND C4
C3 Complement: 178 mg/dL (ref 83–193)
C4 Complement: 45 mg/dL (ref 15–57)

## 2024-06-01 LAB — RNP ANTIBODY: Ribonucleic Protein(ENA) Antibody, IgG: 1.9 AI — AB

## 2024-06-01 LAB — CK: Total CK: 185 U/L (ref 21–240)

## 2024-06-01 LAB — SEDIMENTATION RATE: Sed Rate: 2 mm/h (ref 0–30)

## 2024-06-03 ENCOUNTER — Other Ambulatory Visit: Payer: Self-pay

## 2024-06-03 ENCOUNTER — Other Ambulatory Visit (HOSPITAL_COMMUNITY): Payer: Self-pay | Admitting: Psychiatry

## 2024-06-03 ENCOUNTER — Other Ambulatory Visit (HOSPITAL_COMMUNITY): Payer: Self-pay

## 2024-06-03 MED ORDER — TRAZODONE HCL 50 MG PO TABS
50.0000 mg | ORAL_TABLET | Freq: Every day | ORAL | 2 refills | Status: DC
  Filled 2024-06-03 – 2024-06-05 (×2): qty 30, 30d supply, fill #0
  Filled 2024-07-02: qty 30, 30d supply, fill #1

## 2024-06-03 NOTE — Telephone Encounter (Signed)
 Trazodone  50 mg sent in. Tell her to try half a pill at first

## 2024-06-04 ENCOUNTER — Other Ambulatory Visit: Payer: Self-pay

## 2024-06-04 ENCOUNTER — Other Ambulatory Visit (HOSPITAL_COMMUNITY): Payer: Self-pay

## 2024-06-04 ENCOUNTER — Encounter (HOSPITAL_COMMUNITY): Payer: Self-pay

## 2024-06-04 DIAGNOSIS — M47816 Spondylosis without myelopathy or radiculopathy, lumbar region: Secondary | ICD-10-CM | POA: Diagnosis not present

## 2024-06-04 DIAGNOSIS — M9903 Segmental and somatic dysfunction of lumbar region: Secondary | ICD-10-CM | POA: Diagnosis not present

## 2024-06-04 DIAGNOSIS — M9902 Segmental and somatic dysfunction of thoracic region: Secondary | ICD-10-CM | POA: Diagnosis not present

## 2024-06-04 DIAGNOSIS — M9901 Segmental and somatic dysfunction of cervical region: Secondary | ICD-10-CM | POA: Diagnosis not present

## 2024-06-04 DIAGNOSIS — S233XXA Sprain of ligaments of thoracic spine, initial encounter: Secondary | ICD-10-CM | POA: Diagnosis not present

## 2024-06-04 DIAGNOSIS — R42 Dizziness and giddiness: Secondary | ICD-10-CM | POA: Diagnosis not present

## 2024-06-04 NOTE — Telephone Encounter (Signed)
Patient aware and informed

## 2024-06-05 ENCOUNTER — Ambulatory Visit

## 2024-06-05 ENCOUNTER — Other Ambulatory Visit (HOSPITAL_COMMUNITY): Payer: Self-pay

## 2024-06-05 ENCOUNTER — Other Ambulatory Visit: Payer: Self-pay

## 2024-06-06 ENCOUNTER — Other Ambulatory Visit: Payer: Self-pay

## 2024-06-11 ENCOUNTER — Other Ambulatory Visit: Payer: Self-pay

## 2024-06-11 ENCOUNTER — Other Ambulatory Visit (HOSPITAL_COMMUNITY): Payer: Self-pay

## 2024-06-11 DIAGNOSIS — H04123 Dry eye syndrome of bilateral lacrimal glands: Secondary | ICD-10-CM | POA: Diagnosis not present

## 2024-06-11 DIAGNOSIS — M9901 Segmental and somatic dysfunction of cervical region: Secondary | ICD-10-CM | POA: Diagnosis not present

## 2024-06-11 DIAGNOSIS — M9903 Segmental and somatic dysfunction of lumbar region: Secondary | ICD-10-CM | POA: Diagnosis not present

## 2024-06-11 DIAGNOSIS — D801 Nonfamilial hypogammaglobulinemia: Secondary | ICD-10-CM | POA: Diagnosis not present

## 2024-06-11 DIAGNOSIS — H43813 Vitreous degeneration, bilateral: Secondary | ICD-10-CM | POA: Diagnosis not present

## 2024-06-11 DIAGNOSIS — R42 Dizziness and giddiness: Secondary | ICD-10-CM | POA: Diagnosis not present

## 2024-06-11 DIAGNOSIS — M47816 Spondylosis without myelopathy or radiculopathy, lumbar region: Secondary | ICD-10-CM | POA: Diagnosis not present

## 2024-06-11 DIAGNOSIS — E119 Type 2 diabetes mellitus without complications: Secondary | ICD-10-CM | POA: Diagnosis not present

## 2024-06-11 DIAGNOSIS — S233XXA Sprain of ligaments of thoracic spine, initial encounter: Secondary | ICD-10-CM | POA: Diagnosis not present

## 2024-06-11 DIAGNOSIS — H18513 Endothelial corneal dystrophy, bilateral: Secondary | ICD-10-CM | POA: Diagnosis not present

## 2024-06-11 DIAGNOSIS — M9902 Segmental and somatic dysfunction of thoracic region: Secondary | ICD-10-CM | POA: Diagnosis not present

## 2024-06-12 ENCOUNTER — Other Ambulatory Visit: Payer: Self-pay

## 2024-06-13 ENCOUNTER — Other Ambulatory Visit: Payer: Self-pay

## 2024-06-14 ENCOUNTER — Encounter: Payer: Self-pay | Admitting: Nurse Practitioner

## 2024-06-14 ENCOUNTER — Ambulatory Visit (INDEPENDENT_AMBULATORY_CARE_PROVIDER_SITE_OTHER): Admitting: Nurse Practitioner

## 2024-06-14 VITALS — BP 98/68 | HR 65 | Ht 71.0 in | Wt 226.8 lb

## 2024-06-14 DIAGNOSIS — R768 Other specified abnormal immunological findings in serum: Secondary | ICD-10-CM

## 2024-06-14 DIAGNOSIS — E236 Other disorders of pituitary gland: Secondary | ICD-10-CM

## 2024-06-14 DIAGNOSIS — Z794 Long term (current) use of insulin: Secondary | ICD-10-CM | POA: Diagnosis not present

## 2024-06-14 DIAGNOSIS — R5382 Chronic fatigue, unspecified: Secondary | ICD-10-CM

## 2024-06-14 DIAGNOSIS — E1165 Type 2 diabetes mellitus with hyperglycemia: Secondary | ICD-10-CM | POA: Diagnosis not present

## 2024-06-14 LAB — POCT GLYCOSYLATED HEMOGLOBIN (HGB A1C): Hemoglobin A1C: 5.8 % — AB (ref 4.0–5.6)

## 2024-06-14 NOTE — Progress Notes (Signed)
 Endocrinology Follow Up Visit       06/14/2024, 12:07 PM   Subjective:    Patient ID: Michelle Clements, female    DOB: [birth year].  Michelle Clements is being seen in follow up after being seen in consultation for management of currently uncontrolled symptomatic diabetes requested by  Michelle Doffing, FNP.   Past Medical History:  Diagnosis Date   Allergic rhinitis    Anemia    Arthritis    Lower back and hips   Asthma    Bipolar disorder (HCC)    Bronchitis 05/2024   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HCC)    Constipation    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HCC)    Depression    Essential hypertension, benign    Fibromyalgia    GERD (gastroesophageal reflux disease)    H/O sleep apnea    Hip pain, left    History of palpitations    Negative Holter monitor   History of pneumonia 12/05/1986   Hyperlipidemia    IBS (irritable bowel syndrome)    Nausea and vomiting 02/16/2024   Neck pain    Obstructive sleep apnea (adult) (pediatric) 04/12/2021   Poor short term memory    PTSD (post-traumatic stress disorder)    S/P Botox injection 11/04/2014   For migraine headaches   Type 2 diabetes mellitus (HCC)    Urgency of urination     Past Surgical History:  Procedure Laterality Date   CARPAL TUNNEL RELEASE Right 06/10/2013   Procedure: CARPAL TUNNEL RELEASE;  Surgeon: Darina MALVA Boehringer, MD;  Location: [facility];  Service: Neurosurgery;  Laterality: Right;  Right Carpal Tunnel Release    CHOLECYSTECTOMY     DENTAL SURGERY  12/05/2014   ESOPHAGOGASTRODUODENOSCOPY (EGD) WITH PROPOFOL  N/A 02/16/2024   Procedure: ESOPHAGOGASTRODUODENOSCOPY (EGD) WITH PROPOFOL ;  Surgeon: Eartha Angelia Sieving, MD;  Location: [facility];  Service: Gastroenterology;  Laterality: N/A;  11:30am;ASA 1   mole exc     x 3   MUSCLE BIOPSY  12/05/2006   Right leg   POLYPECTOMY  02/16/2024   Procedure: POLYPECTOMY;  Surgeon:  Eartha Angelia Sieving, MD;  Location: [facility];  Service: Gastroenterology;;   HARLEY DILATION  02/16/2024   Procedure: EGD, WITH DILATION USING SAVARY-GILLIARD DILATOR OVER GUIDEWIRE;  Surgeon: Eartha Angelia Sieving, MD;  Location: [facility];  Service: Gastroenterology;;   TRIGGER FINGER RELEASE Right 03/2024   thumb   TUBAL LIGATION      Social History   Socioeconomic History   Marital status: Married    Spouse name: Not on file   Number of children: 2   Years of education: College   Highest education level: Associate degree: academic program  Occupational History   Occupation: Chiropractor: UNEMPLOYED    Comment: Now disabled due to bipolar and fibromyalgia  Tobacco Use   Smoking status: Former    Current packs/day: 0.00    Types: Cigarettes    Quit date: 11/04/2010    Years since quitting: 13.6    Passive exposure: Past   Smokeless tobacco: Never  Vaping Use   Vaping status: Never Used  Substance and Sexual Activity   Alcohol  use: Yes    Comment: one mixed drink per month   Drug use: No   Sexual activity: Yes    Birth control/protection: Surgical  Other Topics Concern   Not on file  Social History Narrative   Married   Lives with spouse and daughter   Right handed.   Caffeine use: 2 cups coffee in morning and 2 cups tea during the day    Social Drivers of Health   Financial Resource Strain: Low Risk  (05/23/2024)   Overall Financial Resource Strain (CARDIA)    Difficulty of Paying Living Expenses: Not very hard  Food Insecurity: No Food Insecurity (05/23/2024)   Hunger Vital Sign    Worried About Running Out of Food in the Last Year: Never true    Ran Out of Food in the Last Year: Never true  Transportation Needs: No Transportation Needs (05/23/2024)   PRAPARE - Administrator, Civil Service (Medical): No    Lack of Transportation (Non-Medical): No  Physical Activity: Insufficiently Active (05/23/2024)   Exercise Vital  Sign    Days of Exercise per Week: 1 day    Minutes of Exercise per Session: 10 min  Stress: Stress Concern Present (05/23/2024)   [employer] of Occupational Health - Occupational Stress Questionnaire    Feeling of Stress: Rather much  Social Connections: Moderately Isolated (05/23/2024)   Social Connection and Isolation Panel    Frequency of Communication with Friends and Family: Twice a week    Frequency of Social Gatherings with Friends and Family: More than three times a week    Attends Religious Services: Never    Database administrator or Organizations: No    Attends Engineer, structural: Not on file    Marital Status: Married    Family History  Problem Relation Age of Onset   Fibromyalgia Mother    Diabetes type II Mother    Depression Mother    Alzheimer's disease Mother    GER disease Mother    Heart disease Mother    Paranoid behavior Mother    Dementia Mother    Heart attack Father 52       MI x5   Stroke Father        CVA x7   Asthma Father    Brain cancer Father    Seizures Father    Anxiety disorder Sister    OCD Sister    Sexual abuse Sister    Physical abuse Sister    Anxiety disorder Sister    Fibromyalgia Sister    Other Sister        Temporomandibular Joint   ADD / ADHD Daughter    ADD / ADHD Daughter    Alcohol abuse Neg Hx    Drug abuse Neg Hx    Schizophrenia Neg Hx     Outpatient Encounter Medications as of 06/14/2024  Medication Sig   acetaZOLAMIDE  (DIAMOX ) 250 MG tablet Take 1 tablet (250 mg total) by mouth daily for 14 days, THEN 1 tablet (250 mg total) 2 (two) times daily for 14 days, THEN 1 tablet (250 mg) in the morning and 2 tablets (500 mg) in the evening for 14 days, THEN 2 tablets (500 mg total) 2 (two) times daily.   albuterol  (VENTOLIN  HFA) 108 (90 Base) MCG/ACT inhaler Inhale 2 puffs into the lungs every 6 (six) hours as needed for wheezing and/or shortness of breath.   aspirin EC 81 MG tablet Take 1 tablet every day  by oral route.   azelastine  (ASTELIN ) 0.1 % nasal spray Place 1 spray into both nostrils 2 (two) times daily.   Blood Glucose Monitoring Suppl (ONETOUCH VERIO FLEX SYSTEM) w/Device KIT Use as directed to check blood glucose twice daily, before breakfast and before bed.   Blood Glucose Monitoring Suppl (ONETOUCH VERIO) w/Device KIT Use to check glucose once daily   buPROPion  (WELLBUTRIN  XL) 150 MG 24 hr tablet Take 1 tablet (150 mg total) by mouth every morning.   cetirizine  (ZYRTEC ) 10 MG tablet Take 10 mg by mouth at bedtime.   clonazePAM  (KLONOPIN ) 0.5 MG tablet Take 1 tablet (0.5 mg total) by mouth at bedtime.   Coenzyme Q10 (COQ10 PO) Take by mouth. One daily   Collagen Hydrolysate POWD by Does not apply route.   Continuous Glucose Sensor (FREESTYLE LIBRE 3 PLUS SENSOR) MISC Change sensor every 15 days.   cycloSPORINE  (RESTASIS ) 0.05 % ophthalmic emulsion Place 1 drop into both eyes 2 (two) times daily.   dapagliflozin  propanediol (FARXIGA ) 10 MG TABS tablet Take 1 tablet (10 mg total) by mouth in the morning.   estradiol  (ESTRACE ) 0.1 MG/GM vaginal cream Place 1 Applicatorful vaginally 3 (three) times a week.   FLUoxetine  (PROZAC ) 40 MG capsule Take 1 capsule (40 mg total) by mouth daily.   fluticasone  (FLONASE ) 50 MCG/ACT nasal spray Place 1 spray into both nostrils daily.   Galcanezumab -gnlm (EMGALITY ) 120 MG/ML SOSY Inject 1 mL into the skin every 30 (thirty) days.   Immune Globulin , Human,-klhw (XEMBIFY ) 10 GM/50ML SOLN Inject 10 g into the skin every 7 (seven) days.   insulin  glargine (LANTUS ) 100 UNIT/ML Solostar Pen Inject 40 Units into the skin at bedtime.   ipratropium-albuterol  (DUONEB) 0.5-2.5 (3) MG/3ML SOLN Take 3 mLs by nebulization 2 (two) times daily as needed.   lamoTRIgine  (LAMICTAL ) 25 MG tablet Take 1 tablet (25 mg total) by mouth 2 (two) times daily.   Lancets (ONETOUCH ULTRASOFT) lancets Use to check blood sugar once daily   Latanoprostene Bunod  (VYZULTA ) 0.024 % SOLN  Place 1 drop into both eyes at bedtime as directed   lidocaine  (LIDODERM ) 5 % Place 1 patch onto the skin daily, may wear up to 12 hours   Melatonin 10 MG TABS    omeprazole  (PRILOSEC) 20 MG capsule Take 1 capsule (20 mg total) by mouth 2 (two) times daily.   ondansetron  (ZOFRAN -ODT) 8 MG disintegrating tablet Dissolve 1 tablet (8 mg total) by mouth every 8 (eight) hours as needed for nausea or vomiting.   ONETOUCH VERIO test strip Use to check glucose once daily.   OVER THE COUNTER MEDICATION Magnessium 500 mg at night   pregabalin  (LYRICA ) 200 MG capsule Take 1 capsule (200 mg total) by mouth 3 (three) times daily.   rosuvastatin  (CRESTOR ) 40 MG tablet Take 1 tablet (40 mg total) by mouth at bedtime.   sennosides-docusate sodium (SENOKOT-S) 8.6-50 MG tablet Take 2 tablets by mouth at bedtime.   tiZANidine  (ZANAFLEX ) 4 MG tablet Take 1 tablet (4 mg total) by mouth every 12 (twelve) hours as needed.   traZODone  (DESYREL ) 50 MG tablet Take 1 tablet (50 mg total) by mouth at bedtime.   UNABLE TO FIND 1 each by Does not apply route daily. Med Name: CPAP SUPPLIES Full face mask Cushion for mask -Medium ResMed   VITAMIN D  PO Take 5,000 Units by mouth daily.   zinc gluconate 50 MG tablet Take 50 mg by mouth daily.   traMADol (ULTRAM) 50 MG tablet TAKE  1 TABLET EVERY 4 TO 6 HOURS BY MOUTH (Patient not taking: Reported on 05/31/2024)   [DISCONTINUED] lubiprostone  (AMITIZA ) 24 MCG capsule Take 1 capsule (24 mcg total) by mouth 2 (two) times daily.   [DISCONTINUED] methylPREDNISolone  (MEDROL  DOSEPAK) 4 MG TBPK tablet Take as package instructions. (Patient not taking: Reported on 05/31/2024)   [DISCONTINUED] rOPINIRole  (REQUIP ) 0.25 MG tablet Take 1 tablet (0.25 mg total) by mouth 2 (two) times daily.   [DISCONTINUED] SUMAtriptan  (IMITREX ) 100 MG tablet Take 1 tablet by mouth as needed for migraine. If migraine continues, may repeat dose after 2 hours.   No facility-administered encounter medications on  file as of 06/14/2024.    ALLERGIES: Allergies  Allergen Reactions   Dust Mite Extract Cough, Other (See Comments) and Shortness Of Breath    Wheezing  Other Reaction(s): Cough, Other (See Comments)  Other reaction(s): Wheezing    Wheezing    Wheezing Other reaction(s): Wheezing Wheezing Wheezing Other reaction(s): Wheezing Wheezing Wheezing Other reaction(s): Wheezing Wheezing Wheezing Other reaction(s): Wheezing Wheezing Wheezing Other reaction(s): Wheezing Wheezing Wheezing Other reaction(s): Wheezing Wheezing Wheezing Other reaction(s): Wheezing Wheezing    Wheezing, Other reaction(s): Wheezing, Wheezing, Wheezing, Other reaction(s): Wheezing, Wheezing, Wheezing, Other reaction(s): Wheezing, Wheezing, Wheezing, Other reaction(s): Wheezing, Wheezing, Wheezing, Other reaction(s): Wheezing, Wheezing, Wheezing, Other reaction(s): Wheezing, Wheezing, Wheezing, Other reaction(s): Wheezing, Wheezing  Wheezing Other reaction(s): Wheezing Wheezing Wheezing Other reaction(s): Wheezing Wheezing Wheezing Other reaction(s): Wheezing Wheezing Wheezing Other reaction(s): Wheezing Wheezing Wheezing Other reaction(s): Wheezing Wheezing Wheezing Other reaction(s): Wheezing Wheezing Wheezing Other reaction(s): Wheezing Wheezing    Wheezing  Other Reaction(s): Cough, Other (See Comments)  Other reaction(s): Wheezing  Wheezing  Wheezing Other reaction(s): Wheezing Wheezing Wheezing Other reaction(s): Wheezing Wheezing Wheezing Other reaction(s): Wheezing Wheezing Wheezing Other reaction(s): Wheezing Wheezing Wheezing Other reaction(s): Wheezing Wheezing Wheezing Other reaction(s): Wheezing Wheezing Wheezing Other reaction(s): Wheezing Wheezing  Wheezing, Other reaction(s): Wheezing, Wheezing, Wheezing, Other reaction(s): Wheezing, Wheezing, Wheezing, Other reaction(s): Wheezing, Wheezing, Wheezing, Other reaction(s): Wheezing, Wheezing, Wheezing, Other reaction(s): Wheezing, Wheezing, Wheezing, Other reaction(s):  Wheezing, Wheezing, Wheezing, Other reaction(s): Wheezing, Wheezing    Wheezing Other reaction(s): Wheezing Wheezing    Wheezing    Other reaction(s): Wheezing  Wheezing  Wheezing Other reaction(s): Wheezing Wheezing Wheezing Other reaction(s): Wheezing Wheezing Wheezing Other reaction(s): Wheezing Wheezing Wheezing Other reaction(s): Wheezing Wheezing Wheezing Other reaction(s): Wheezing Wheezing Wheezing Other reaction(s): Wheezing Wheezing Wheezing Other reaction(s): Wheezing Wheezing  Whee... (TRUNCATED)  Wheezing Other reaction(s): Wheezing Wheezing   Gramineae Pollens Shortness Of Breath    wheezing   Misc. Sulfonamide Containing Compounds Anaphylaxis   Molds & Smuts Shortness Of Breath    wheezing  wheezing wheezing wheezing    wheezing, wheezing, wheezing  wheezing wheezing wheezing    wheezing  wheezing wheezing wheezing  wheezing, wheezing, wheezing    wheezing    wheezing wheezing wheezing  wheezing, wheezing, wheezing    wheezing, wheezing, wheezing   Other Anaphylaxis, Other (See Comments) and Shortness Of Breath    : Angioedema  wheezing  Wheezing  Other reaction(s): Cough  Itching and rash  Other reaction(s): Other (See Comments)   : Angioedema   Statins Anaphylaxis   Sulfa Antibiotics Anaphylaxis and Other (See Comments)    : Angioedema  : Angioedema  : Angioedema     : Angioedema    : Angioedema  : Angioedema   : Angioedema,  : Angioedema     : Angioedema,  : Angioedema   Sulfamethoxazole-Trimethoprim Anaphylaxis and Other (See Comments)   Trichophyton Shortness  Of Breath    wheezing  wheezing    wheezing wheezing wheezing wheezing wheezing wheezing wheezing wheezing wheezing wheezing wheezing wheezing wheezing wheezing wheezing wheezing wheezing    wheezing, wheezing, wheezing, wheezing, wheezing, wheezing, wheezing, wheezing, wheezing, wheezing, wheezing, wheezing, wheezing, wheezing, wheezing, wheezing, wheezing  wheezing wheezing  wheezing wheezing wheezing wheezing wheezing wheezing wheezing wheezing wheezing wheezing wheezing wheezing wheezing wheezing wheezing    wheezing  wheezing  wheezing wheezing wheezing wheezing wheezing wheezing wheezing wheezing wheezing wheezing wheezing wheezing wheezing wheezing wheezing wheezing wheezing  wheezing, wheezing, wheezing, wheezing, wheezing, wheezing, wheezing, wheezing, wheezing, wheezing, wheezing, wheezing, wheezing, wheezing, wheezing, wheezing, wheezing    wheezing wheezing    wheezing  wheezing wheezing wheezing wheezing wheezing wheezing wheezing wheezing wheezing wheezing wheezing wheezing wheezing wheezing wheezing wheezing wheezing  wheezing, wheezing, wheezing, wheezing, wheezing, wheezing, wheezing, wheezing, wheezing, wheezing, wheezing, wheezing, wheezing, wheezing, wheezing, wheezing, wheezing    wheezing, wheezing, wheezing, wheezing, wheezing, wheezing, wheezing, wheezing, wheezing, wheezing, wheezing, wheezing, wheezing, wheezing, wheezing, wheezing, wheezing   Carbamazepine  Rash, Dermatitis and Other (See Comments)    Other Reaction(s): Other (See Comments)   Gluten Meal Itching and Other (See Comments)    Itching and rash Other reaction(s): Abdominal Pain Itching and rash Itching and rash    Itching and rash    Other reaction(s): Abdominal Pain  Itching and rash  Itching and rash Other reaction(s): Abdominal Pain Itching and rash Itching and rash  Itching and rash, Other reaction(s): Abdominal Pain, Itching and rash, Itching and rash    Itching and rash, Other reaction(s): Abdominal Pain, Itching and rash, Itching and rash   Quetiapine Other (See Comments)   Quetiapine Fumarate Other (See Comments)    VACCINATION STATUS: Immunization History  Administered Date(s) Administered   Fluzone Influenza virus vaccine,trivalent (IIV3), split virus 09/24/2009   Influenza Whole 10/03/2007   Influenza,inj,Quad PF,6+ Mos 10/03/2018, 09/01/2021    Influenza,inj,quad, With Preservative 09/26/2019   Influenza-Unspecified 09/18/2014, 09/23/2015, 09/04/2017   Novel Infuenza-h1n1-09 01/26/2009   Pneumococcal Polysaccharide-23 10/14/2012   Td 01/21/2019    Diabetes She presents for her follow-up diabetic visit. She has type 2 diabetes mellitus. Onset time: She was diagnosed at approximate age of 65. Her disease course has been improving. There are no hypoglycemic associated symptoms. Pertinent negatives for diabetes include no blurred vision, no fatigue, no polydipsia, no polyphagia and no polyuria. There are no hypoglycemic complications. Symptoms are stable. Diabetic complications include nephropathy and retinopathy. Risk factors for coronary artery disease include diabetes mellitus, dyslipidemia, obesity and sedentary lifestyle. Current diabetic treatment includes insulin  injections. She is compliant with treatment all of the time. Her weight is fluctuating minimally. She is following a generally healthy diet. Meal planning includes avoidance of concentrated sweets. She has not had a previous visit with a dietitian. She participates in exercise intermittently. Her home blood glucose trend is decreasing steadily. (She presents today with her CGM and logs showing tight glycemic profile.  Her POCT A1c today is 5.8%, improving from last visit of 6.1%.  Analysis of her CGM shows TIR 98%, TAR 2%, TBR 0% with a GMI of 6.1%.  She was started on Farxiga  by nephrology.) An ACE inhibitor/angiotensin II receptor blocker is not being taken. She does not see a podiatrist.Eye exam is current.  Hyperlipidemia This is a chronic problem. The current episode started more than 1 year ago. The problem is uncontrolled. Recent lipid tests were reviewed and are variable. Exacerbating diseases include chronic renal disease, diabetes and obesity. There are no  known factors aggravating her hyperlipidemia. Current antihyperlipidemic treatment includes statins. The current  treatment provides moderate improvement of lipids. There are no compliance problems.  Risk factors for coronary artery disease include diabetes mellitus, dyslipidemia, obesity and a sedentary lifestyle.    Review of systems  Constitutional: + steadily decreasing body weight-unintentional,  current Body mass index is 31.63 kg/m. , no fatigue, no subjective hyperthermia, no subjective hypothermia Eyes: no blurry vision, no xerophthalmia ENT: no sore throat, no nodules palpated in throat, no dysphagia/odynophagia, no hoarseness Cardiovascular: no chest pain, no shortness of breath, no palpitations, no leg swelling Respiratory: no cough, no shortness of breath Gastrointestinal: no nausea/vomiting/diarrhea Musculoskeletal: no muscle/joint aches Skin: no rashes, no hyperemia Neurological: no tremors, no numbness, no tingling, no dizziness Psychiatric: no depression, no anxiety, hx bipolar d/o- controlled on meds   Objective:    BP 98/68 (BP Location: Right Arm, Patient Position: Sitting, Cuff Size: Large)   Pulse 65   Ht 5' 11 (1.803 m)   Wt 226 lb 12.8 oz (102.9 kg)   LMP 01/04/2018   BMI 31.63 kg/m   Wt Readings from Last 3 Encounters:  06/14/24 226 lb 12.8 oz (102.9 kg)  05/31/24 222 lb 12.8 oz (101.1 kg)  05/23/24 225 lb (102.1 kg)    BP Readings from Last 3 Encounters:  06/14/24 98/68  05/31/24 124/72  05/23/24 139/77    Physical Exam- Limited  Constitutional:  Body mass index is 31.63 kg/m. , not in acute distress, normal state of mind Eyes:  EOMI, no exophthalmos Musculoskeletal: no gross deformities, strength intact in all four extremities, no gross restriction of joint movements Skin:  no rashes, no hyperemia Neurological: no tremor with outstretched hands   Diabetic Foot Exam - Simple   No data filed    CMP ( most recent) CMP     Component Value Date/Time   NA 143 04/18/2024 1202   K 4.9 04/18/2024 1202   CL 109 (H) 04/18/2024 1202   CO2 20 04/18/2024  1202   GLUCOSE 85 04/18/2024 1202   GLUCOSE 108 (H) 10/02/2023 0948   BUN 18 04/18/2024 1202   CREATININE 1.30 (H) 04/18/2024 1202   CREATININE 0.88 02/21/2012 1153   CALCIUM  10.3 (H) 04/18/2024 1202   PROT 6.9 04/18/2024 1202   ALBUMIN 4.6 04/18/2024 1202   AST 33 04/18/2024 1202   ALT 39 (H) 04/18/2024 1202   ALKPHOS 91 04/18/2024 1202   BILITOT 0.5 04/18/2024 1202   GFRNONAA 59 (L) 10/02/2023 0948   GFRAA 50 12/29/2020 0000     Diabetic Labs (most recent): Lab Results  Component Value Date   HGBA1C 5.8 (A) 06/14/2024   HGBA1C 6.2 (A) 11/07/2023   HGBA1C 6.5 (A) 07/05/2023   MICROALBUR 26 03/31/2022   MICROALBUR 150 10/22/2020     Lipid Panel ( most recent) Lipid Panel     Component Value Date/Time   CHOL 165 12/05/2023 1058   TRIG 165 (H) 12/05/2023 1058   HDL 49 12/05/2023 1058   CHOLHDL 3.4 12/05/2023 1058   CHOLHDL 4.2 03/18/2015 0915   VLDL 44 (H) 03/18/2015 0915   LDLCALC 88 12/05/2023 1058   LABVLDL 28 12/05/2023 1058      Lab Results  Component Value Date   TSH 1.500 03/05/2024   TSH 1.030 10/27/2023   TSH 1.920 02/03/2023   TSH 2.69 12/29/2020   TSH 1.66 09/14/2020   TSH 2.770 08/29/2019   TSH 3.261 10/14/2012   TSH 2.651 02/21/2012   TSH 1.981  03/06/2009   TSH 3.250 01/23/2008   FREET4 1.14 03/05/2024   FREET4 1.14 10/27/2023   FREET4 1.05 02/03/2023           Assessment & Plan:   1) Type 2 diabetes mellitus with hyperglycemia, with long-term current use of insulin  (HCC)  - Michelle Clements has currently uncontrolled symptomatic type 2 DM since 58 years of age.  She presents today with her CGM and logs showing tight glycemic profile.  Her POCT A1c today is 5.8%, improving from last visit of 6.1%.  Analysis of her CGM shows TIR 98%, TAR 2%, TBR 0% with a GMI of 6.1%.  She was started on Farxiga  by nephrology.   Recent labs reviewed, showing stable CKD stage 3.    - I had a long discussion with her about the progressive nature of  diabetes and the pathology behind its complications. -her diabetes is complicated by sleep apnea, HLD, retinopathy and she remains at a high risk for more acute and chronic complications which include CAD, CVA, CKD, retinopathy, and neuropathy. These are all discussed in detail with her.  - Nutritional counseling repeated at each appointment due to patients tendency to fall back in to old habits.  - The patient admits there is a room for improvement in their diet and drink choices. -  Suggestion is made for the patient to avoid simple carbohydrates from their diet including Cakes, Sweet Desserts / Pastries, Ice Cream, Soda (diet and regular), Sweet Tea, Candies, Chips, Cookies, Sweet Pastries, Store Bought Juices, Alcohol in Excess of 1-2 drinks a day, Artificial Sweeteners, Coffee Creamer, and Sugar-free Products. This will help patient to have stable blood glucose profile and potentially avoid unintended weight gain.   - I encouraged the patient to switch to unprocessed or minimally processed complex starch and increased protein intake (animal or plant source), fruits, and vegetables.   - Patient is advised to stick to a routine mealtimes to eat 3 meals a day and avoid unnecessary snacks (to snack only to correct hypoglycemia).  - she is following with Santana Duke, RDE for diabetes education.  - I have approached her with the following individualized plan to manage  her diabetes and patient agrees:   -She is advised to lower her Lantus  to 30 units SQ nightly and can continue Farxiga  10 mg po daily.  -She is encouraged to continue monitoring blood glucose twice daily, before breakfast and before bed, and to call the clinic if she has readings less than 70 or greater than 200 for 3 tests in a row.  - Specific targets for  A1c;  LDL, HDL,  and Triglycerides were discussed with the patient.  2) Blood Pressure /Hypertension:   her blood pressure is controlled to target without the use of  antihypertensive medications.  She is advised to continue Lisinopril 5 mg po daily with breakfast.   3) Lipids/Hyperlipidemia:    Her most recent lipid panel from 07/25/23 shows controlled LDL of 87.  She is advised to continue her Crestor  20 mg po daily at bedtime.  Side effects and precautions discussed with her.  4) Weight/Diet:  Her Body mass index is 31.63 kg/m.  -  clearly complicating her diabetes care.  She has lost approx 11 lbs and is advised to keep it up!  she is a candidate for weight loss. I discussed with her the fact that loss of 5 - 10% of her  current body weight will have the most impact on her diabetes  management.  Exercise, and detailed carbohydrates information provided  -  detailed on discharge instructions.  5) Vitamin D  deficiency Her most recent vitamin D  level was 73.7 on 10/27/23.  She is currently on OTC vitamin D3 2000 units daily.  She is advised to continue supplementation at this time, especially during the winter months.  6) Chronic Care/Health Maintenance: -she is on Statin medications and is encouraged to initiate and continue to follow up with Ophthalmology, Dentist,  Podiatrist at least yearly or according to recommendations, and advised to stay away from smoking. I have recommended yearly flu vaccine and pneumonia vaccine at least every 5 years; moderate intensity exercise for up to 150 minutes weekly; and  sleep for at least 7 hours a day.  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  And her thyroid  is consistent with euthyroid presentation at this time.  She does not need antithyroid treatment or thyroid  hormone replacement at this time.  Will recheck labs prior to next visit for surveillance.SABRA  8) Empty sella Recent imaging of the brain showed incidental empty sella.  Will add Prolactin level to assess further- this came back  normal.    - she is advised to maintain close follow up with Michelle Doffing, FNP for primary care needs, as well as her other providers for optimal and coordinated care.     I spent  25  minutes in the care of the patient today including review of labs from CMP, Lipids, Thyroid  Function, Hematology (current and previous including abstractions from other facilities); face-to-face time discussing  her blood glucose readings/logs, discussing hypoglycemia and hyperglycemia episodes and symptoms, medications doses, her options of short and long term treatment based on the latest standards of care / guidelines;  discussion about incorporating lifestyle medicine;  and documenting the encounter. Risk reduction counseling performed per USPSTF guidelines to reduce obesity and cardiovascular risk factors.     Please refer to Patient Instructions for Blood Glucose Monitoring and Insulin /Medications Dosing Guide  in media tab for additional information. Please  also refer to  Patient Self Inventory in the Media  tab for reviewed elements of pertinent patient history.  Michelle Clements participated in the discussions, expressed understanding, and voiced agreement with the above plans.  All questions were answered to her satisfaction. she is encouraged to contact clinic should she have any questions or concerns prior to her return visit.   Follow up plan: - Return in about 4 months (around 10/15/2024) for Diabetes F/U with A1c in office, Thyroid  follow up, Clements meter and logs.  Benton Rio, [facility] [address] [place], [place] [address] Phone: [phone] Fax: [phone]  06/14/2024, 12:07 PM

## 2024-06-18 DIAGNOSIS — S233XXA Sprain of ligaments of thoracic spine, initial encounter: Secondary | ICD-10-CM | POA: Diagnosis not present

## 2024-06-18 DIAGNOSIS — M47816 Spondylosis without myelopathy or radiculopathy, lumbar region: Secondary | ICD-10-CM | POA: Diagnosis not present

## 2024-06-18 DIAGNOSIS — R42 Dizziness and giddiness: Secondary | ICD-10-CM | POA: Diagnosis not present

## 2024-06-18 DIAGNOSIS — M9903 Segmental and somatic dysfunction of lumbar region: Secondary | ICD-10-CM | POA: Diagnosis not present

## 2024-06-18 DIAGNOSIS — M9902 Segmental and somatic dysfunction of thoracic region: Secondary | ICD-10-CM | POA: Diagnosis not present

## 2024-06-18 DIAGNOSIS — M9901 Segmental and somatic dysfunction of cervical region: Secondary | ICD-10-CM | POA: Diagnosis not present

## 2024-06-20 ENCOUNTER — Other Ambulatory Visit (HOSPITAL_COMMUNITY): Payer: Self-pay

## 2024-06-20 MED ORDER — PREGABALIN 200 MG PO CAPS
200.0000 mg | ORAL_CAPSULE | Freq: Three times a day (TID) | ORAL | 0 refills | Status: DC
  Filled 2024-06-21 – 2024-06-24 (×2): qty 90, 30d supply, fill #0

## 2024-06-21 ENCOUNTER — Other Ambulatory Visit (HOSPITAL_COMMUNITY): Payer: Self-pay

## 2024-06-21 ENCOUNTER — Other Ambulatory Visit: Payer: Self-pay

## 2024-06-24 ENCOUNTER — Other Ambulatory Visit (HOSPITAL_COMMUNITY): Payer: Self-pay

## 2024-06-24 ENCOUNTER — Other Ambulatory Visit: Payer: Self-pay

## 2024-06-26 ENCOUNTER — Other Ambulatory Visit: Payer: Self-pay

## 2024-06-26 ENCOUNTER — Other Ambulatory Visit (HOSPITAL_COMMUNITY): Payer: Self-pay | Admitting: Psychiatry

## 2024-06-26 ENCOUNTER — Other Ambulatory Visit (HOSPITAL_COMMUNITY): Payer: Self-pay

## 2024-06-26 ENCOUNTER — Other Ambulatory Visit: Payer: Self-pay | Admitting: Internal Medicine

## 2024-06-26 MED ORDER — LUBIPROSTONE 24 MCG PO CAPS
24.0000 ug | ORAL_CAPSULE | Freq: Two times a day (BID) | ORAL | 3 refills | Status: DC
  Filled 2024-07-02 – 2024-07-16 (×5): qty 60, 30d supply, fill #0
  Filled 2024-08-13: qty 60, 30d supply, fill #1
  Filled 2024-09-10: qty 60, 30d supply, fill #2
  Filled ????-??-??: fill #3

## 2024-06-26 MED ORDER — BUPROPION HCL ER (XL) 150 MG PO TB24
150.0000 mg | ORAL_TABLET | Freq: Every morning | ORAL | 2 refills | Status: DC
Start: 2024-06-26 — End: 2024-07-02
  Filled 2024-07-02: qty 30, 30d supply, fill #0

## 2024-06-26 MED ORDER — FLUOXETINE HCL 40 MG PO CAPS
40.0000 mg | ORAL_CAPSULE | Freq: Every day | ORAL | 2 refills | Status: DC
  Filled ????-??-??: fill #0

## 2024-06-26 MED ORDER — LAMOTRIGINE 25 MG PO TABS
25.0000 mg | ORAL_TABLET | Freq: Two times a day (BID) | ORAL | 2 refills | Status: DC
  Filled 2024-07-02: qty 60, 30d supply, fill #0

## 2024-06-27 ENCOUNTER — Other Ambulatory Visit: Payer: Self-pay

## 2024-06-27 ENCOUNTER — Ambulatory Visit: Payer: Self-pay

## 2024-06-27 DIAGNOSIS — M9901 Segmental and somatic dysfunction of cervical region: Secondary | ICD-10-CM | POA: Diagnosis not present

## 2024-06-27 DIAGNOSIS — S233XXA Sprain of ligaments of thoracic spine, initial encounter: Secondary | ICD-10-CM | POA: Diagnosis not present

## 2024-06-27 DIAGNOSIS — M9902 Segmental and somatic dysfunction of thoracic region: Secondary | ICD-10-CM | POA: Diagnosis not present

## 2024-06-27 DIAGNOSIS — M47816 Spondylosis without myelopathy or radiculopathy, lumbar region: Secondary | ICD-10-CM | POA: Diagnosis not present

## 2024-06-27 DIAGNOSIS — M9903 Segmental and somatic dysfunction of lumbar region: Secondary | ICD-10-CM | POA: Diagnosis not present

## 2024-06-27 DIAGNOSIS — R42 Dizziness and giddiness: Secondary | ICD-10-CM | POA: Diagnosis not present

## 2024-06-27 NOTE — Telephone Encounter (Signed)
 FYI Only or Action Required?: FYI only for provider.  Patient was last seen in primary care on 05/23/2024 by Tobie Suzzane POUR, MD.  Called Nurse Triage reporting No chief complaint on file..  Symptoms began a week ago.  Interventions attempted: Nothing.  Symptoms are: unchanged.  Triage Disposition: See PCP When Office is Open (Within 3 Days)  Patient/caregiver understands and will follow disposition?: No, refuses disposition   Patient will try OTC medication for her symptoms.      Copied from CRM [id number]. Topic: Clinical - Red Word Triage >> Jun 27, 2024 11:53 AM Avram MATSU wrote: Red Word that prompted transfer to Nurse Triage: pt stated she's on farxiga  and has a yeast infection and found a lump in the genital area     Reason for Disposition  Tender lump (swelling or ball) at vaginal opening  Answer Assessment - Initial Assessment Questions No appointments available prior to her already scheduled follow-up. Patient advised to go to urgent care but states she would rather try OTC yeast infection treatments and wait for her upcoming appointment.        1. SYMPTOM: What's the main symptom you're concerned about? (e.g., pain, itching, dryness)     Itching and burning  2. LOCATION: Where is the itching and burning located? (e.g., inside/outside, left/right)     Labia  3. ONSET: When did the itching/burning start?     1 week 4. PAIN: Is there any pain? If Yes, ask: How bad is it? (Scale: 1-10; mild, moderate, severe)     3/10 5. ITCHING: Is there any itching? If Yes, ask: How bad is it? (Scale: 1-10; mild, moderate, severe)     4/10 6. CAUSE: What do you think is causing the discharge? Have you had the same problem before? What happened then?     Believes yeast infection from Farxiga   7. OTHER SYMPTOMS: Do you have any other symptoms? (e.g., fever, itching, vaginal bleeding, pain with urination, injury to genital area, vaginal foreign body)      Vaginal lump noticed  Protocols used: Vaginal Symptoms-A-AH

## 2024-07-01 DIAGNOSIS — K5902 Outlet dysfunction constipation: Secondary | ICD-10-CM | POA: Diagnosis not present

## 2024-07-02 ENCOUNTER — Other Ambulatory Visit: Payer: Self-pay

## 2024-07-02 ENCOUNTER — Telehealth (INDEPENDENT_AMBULATORY_CARE_PROVIDER_SITE_OTHER): Admitting: Psychiatry

## 2024-07-02 ENCOUNTER — Other Ambulatory Visit (HOSPITAL_COMMUNITY): Payer: Self-pay

## 2024-07-02 ENCOUNTER — Encounter (HOSPITAL_COMMUNITY): Payer: Self-pay | Admitting: Psychiatry

## 2024-07-02 DIAGNOSIS — F319 Bipolar disorder, unspecified: Secondary | ICD-10-CM | POA: Diagnosis not present

## 2024-07-02 DIAGNOSIS — F332 Major depressive disorder, recurrent severe without psychotic features: Secondary | ICD-10-CM

## 2024-07-02 MED ORDER — TRAZODONE HCL 50 MG PO TABS
50.0000 mg | ORAL_TABLET | Freq: Every day | ORAL | 2 refills | Status: DC
  Filled 2024-07-02 – 2024-07-16 (×5): qty 30, 30d supply, fill #0

## 2024-07-02 MED ORDER — FLUOXETINE HCL 40 MG PO CAPS
40.0000 mg | ORAL_CAPSULE | Freq: Every day | ORAL | 2 refills | Status: DC
  Filled 2024-07-02 – 2024-07-16 (×5): qty 30, 30d supply, fill #0

## 2024-07-02 MED ORDER — FLUOXETINE HCL 20 MG PO CAPS
20.0000 mg | ORAL_CAPSULE | Freq: Every day | ORAL | 2 refills | Status: DC
  Filled 2024-07-02: qty 30, 30d supply, fill #0
  Filled 2024-07-03: qty 16, 16d supply, fill #0
  Filled 2024-07-03 (×2): qty 6, 6d supply, fill #0
  Filled 2024-07-09 – 2024-07-15 (×2): qty 16, 16d supply, fill #1
  Filled 2024-07-16: qty 30, 30d supply, fill #1
  Filled ????-??-??: fill #0

## 2024-07-02 MED ORDER — BUPROPION HCL ER (XL) 150 MG PO TB24
150.0000 mg | ORAL_TABLET | Freq: Every morning | ORAL | 2 refills | Status: DC
  Filled 2024-07-02 – 2024-07-16 (×5): qty 30, 30d supply, fill #0

## 2024-07-02 MED ORDER — LAMOTRIGINE 25 MG PO TABS
25.0000 mg | ORAL_TABLET | Freq: Two times a day (BID) | ORAL | 2 refills | Status: DC
  Filled 2024-07-02 – 2024-07-16 (×5): qty 60, 30d supply, fill #0

## 2024-07-02 MED ORDER — CLONAZEPAM 0.5 MG PO TABS
0.5000 mg | ORAL_TABLET | Freq: Every day | ORAL | 2 refills | Status: DC
  Filled 2024-07-02: qty 30, 30d supply, fill #0

## 2024-07-02 NOTE — Progress Notes (Signed)
 Virtual Visit via Video Note  I connected with Michelle Clements on 07/02/24 at 11:00 AM EDT by a video enabled telemedicine application and verified that I am speaking with the correct person using two identifiers.  Location: Patient: home Provider: office   I discussed the limitations of evaluation and management by telemedicine and the availability of in person appointments. The patient expressed understanding and agreed to proceed.      I discussed the assessment and treatment plan with the patient. The patient was provided an opportunity to ask questions and all were answered. The patient agreed with the plan and demonstrated an understanding of the instructions.   The patient was advised to call back or seek an in-person evaluation if the symptoms worsen or if the condition fails to improve as anticipated.  I provided 20 minutes of non-face-to-face time during this encounter.   [name], MD  [facility] MD/PA/NP OP Progress Note  07/02/2024 11:23 AM Michelle Clements  MRN:  [id number]  Chief Complaint:  Chief Complaint  Patient presents with   Anxiety   Depression   Follow-up   HPI:  This patient is a 58 year old married white female who lives with her husband in [place]. She has 2 grown daughters. 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   The patient returns for follow-up after 3 months regarding her depression manic symptoms and anxiety.  Last visit she had gone off the trazodone  because of oversedation.  However she called a few weeks ago stating that she was unable to sleep so she has gone back on it and is sleeping better.  However she states that she is constantly obsessing about things and feels paranoid.  She mostly worries about her husband whether or not he is having an affair.  He did have an affair 18 years ago but has not repeated this.  He is out in the field a lot for his work as a Hospital doctor.  She also feels stressed because she is watching her 87-year-old  grandson with the help of her 2 year old granddaughter.  Even with the help of the granddaughter she feels overwhelmed by the child's needs.  She has told her daughter that she will be able to watch him once the granddaughter goes back to school.  Given that she is having more obsessional thoughts and worries I suggested we go up on the Prozac  and she agrees.  She denies any thoughts of self-harm or suicide.  She states that she stopped her therapy because of the cost and perhaps some incompatibility issues with the therapist.  She is amenable to coming back to see Winton Rubinstein in our office   Visit Diagnosis:    ICD-10-CM   1. Bipolar 1 disorder (HCC)  F31.9     2. Major depressive disorder, recurrent, severe without psychotic features (HCC)  F33.2       Past Psychiatric History: Admission 2 years ago for overdose/suicide attempt followed by partial hospital program. She was also admitted for detox several years ago   Past Medical History:  Past Medical History:  Diagnosis Date   Allergic rhinitis    Anemia    Arthritis    Lower back and hips   Asthma    Bipolar disorder (HCC)    Bronchitis 05/2024   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HCC)    Constipation    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HCC)    Depression    Essential hypertension, benign    Fibromyalgia  GERD (gastroesophageal reflux disease)    H/O sleep apnea    Hip pain, left    History of palpitations    Negative Holter monitor   History of pneumonia 12/05/1986   Hyperlipidemia    IBS (irritable bowel syndrome)    Nausea and vomiting 02/16/2024   Neck pain    Obstructive sleep apnea (adult) (pediatric) 04/12/2021   Poor short term memory    PTSD (post-traumatic stress disorder)    S/P Botox injection 11/04/2014   For migraine headaches   Type 2 diabetes mellitus (HCC)    Urgency of urination     Past Surgical History:  Procedure Laterality Date   CARPAL TUNNEL RELEASE Right 06/10/2013   Procedure:  CARPAL TUNNEL RELEASE;  Surgeon: Darina MALVA Boehringer, MD;  Location: [facility];  Service: Neurosurgery;  Laterality: Right;  Right Carpal Tunnel Release    CHOLECYSTECTOMY     DENTAL SURGERY  12/05/2014   ESOPHAGOGASTRODUODENOSCOPY (EGD) WITH PROPOFOL  N/A 02/16/2024   Procedure: ESOPHAGOGASTRODUODENOSCOPY (EGD) WITH PROPOFOL ;  Surgeon: Eartha Angelia Sieving, MD;  Location: [facility];  Service: Gastroenterology;  Laterality: N/A;  11:30am;ASA 1   mole exc     x 3   MUSCLE BIOPSY  12/05/2006   Right leg   POLYPECTOMY  02/16/2024   Procedure: POLYPECTOMY;  Surgeon: Eartha Angelia, Sieving, MD;  Location: [facility];  Service: Gastroenterology;;   HARLEY DILATION  02/16/2024   Procedure: EGD, WITH DILATION USING SAVARY-GILLIARD DILATOR OVER GUIDEWIRE;  Surgeon: Eartha Angelia, Sieving, MD;  Location: [facility];  Service: Gastroenterology;;   TRIGGER FINGER RELEASE Right 03/2024   thumb   TUBAL LIGATION      Family Psychiatric History: See below  Family History:  Family History  Problem Relation Age of Onset   Fibromyalgia Mother    Diabetes type II Mother    Depression Mother    Alzheimer's disease Mother    GER disease Mother    Heart disease Mother    Paranoid behavior Mother    Dementia Mother    Heart attack Father 74       MI x5   Stroke Father        CVA x7   Asthma Father    Brain cancer Father    Seizures Father    Anxiety disorder Sister    OCD Sister    Sexual abuse Sister    Physical abuse Sister    Anxiety disorder Sister    Fibromyalgia Sister    Other Sister        Temporomandibular Joint   ADD / ADHD Daughter    ADD / ADHD Daughter    Alcohol abuse Neg Hx    Drug abuse Neg Hx    Schizophrenia Neg Hx     Social History:  Social History   Socioeconomic History   Marital status: Married    Spouse name: Not on file   Number of children: 2   Years of education: College   Highest education level: Associate degree: academic program   Occupational History   Occupation: Chiropractor: UNEMPLOYED    Comment: Now disabled due to bipolar and fibromyalgia  Tobacco Use   Smoking status: Former    Current packs/day: 0.00    Types: Cigarettes    Quit date: 11/04/2010    Years since quitting: 13.6    Passive exposure: Past   Smokeless tobacco: Never  Vaping Use   Vaping status: Never Used  Substance  and Sexual Activity   Alcohol use: Yes    Comment: one mixed drink per month   Drug use: No   Sexual activity: Yes    Birth control/protection: Surgical  Other Topics Concern   Not on file  Social History Narrative   Married   Lives with spouse and daughter   Right handed.   Caffeine use: 2 cups coffee in morning and 2 cups tea during the day    Social Drivers of Health   Financial Resource Strain: Low Risk  (05/23/2024)   Overall Financial Resource Strain (CARDIA)    Difficulty of Paying Living Expenses: Not very hard  Food Insecurity: No Food Insecurity (05/23/2024)   Hunger Vital Sign    Worried About Running Out of Food in the Last Year: Never true    Ran Out of Food in the Last Year: Never true  Transportation Needs: No Transportation Needs (05/23/2024)   PRAPARE - Administrator, Civil Service (Medical): No    Lack of Transportation (Non-Medical): No  Physical Activity: Insufficiently Active (05/23/2024)   Exercise Vital Sign    Days of Exercise per Week: 1 day    Minutes of Exercise per Session: 10 min  Stress: Stress Concern Present (05/23/2024)   [employer] of Occupational Health - Occupational Stress Questionnaire    Feeling of Stress: Rather much  Social Connections: Moderately Isolated (05/23/2024)   Social Connection and Isolation Panel    Frequency of Communication with Friends and Family: Twice a week    Frequency of Social Gatherings with Friends and Family: More than three times a week    Attends Religious Services: Never    Database administrator or Organizations: No     Attends Engineer, structural: Not on file    Marital Status: Married    Allergies:  Allergies  Allergen Reactions   Dust Mite Extract Cough, Other (See Comments) and Shortness Of Breath    Wheezing  Other Reaction(s): Cough, Other (See Comments)  Other reaction(s): Wheezing    Wheezing    Wheezing Other reaction(s): Wheezing Wheezing Wheezing Other reaction(s): Wheezing Wheezing Wheezing Other reaction(s): Wheezing Wheezing Wheezing Other reaction(s): Wheezing Wheezing Wheezing Other reaction(s): Wheezing Wheezing Wheezing Other reaction(s): Wheezing Wheezing Wheezing Other reaction(s): Wheezing Wheezing    Wheezing, Other reaction(s): Wheezing, Wheezing, Wheezing, Other reaction(s): Wheezing, Wheezing, Wheezing, Other reaction(s): Wheezing, Wheezing, Wheezing, Other reaction(s): Wheezing, Wheezing, Wheezing, Other reaction(s): Wheezing, Wheezing, Wheezing, Other reaction(s): Wheezing, Wheezing, Wheezing, Other reaction(s): Wheezing, Wheezing  Wheezing Other reaction(s): Wheezing Wheezing Wheezing Other reaction(s): Wheezing Wheezing Wheezing Other reaction(s): Wheezing Wheezing Wheezing Other reaction(s): Wheezing Wheezing Wheezing Other reaction(s): Wheezing Wheezing Wheezing Other reaction(s): Wheezing Wheezing Wheezing Other reaction(s): Wheezing Wheezing    Wheezing  Other Reaction(s): Cough, Other (See Comments)  Other reaction(s): Wheezing  Wheezing  Wheezing Other reaction(s): Wheezing Wheezing Wheezing Other reaction(s): Wheezing Wheezing Wheezing Other reaction(s): Wheezing Wheezing Wheezing Other reaction(s): Wheezing Wheezing Wheezing Other reaction(s): Wheezing Wheezing Wheezing Other reaction(s): Wheezing Wheezing Wheezing Other reaction(s): Wheezing Wheezing  Wheezing, Other reaction(s): Wheezing, Wheezing, Wheezing, Other reaction(s): Wheezing, Wheezing, Wheezing, Other reaction(s): Wheezing, Wheezing, Wheezing, Other reaction(s): Wheezing, Wheezing, Wheezing, Other  reaction(s): Wheezing, Wheezing, Wheezing, Other reaction(s): Wheezing, Wheezing, Wheezing, Other reaction(s): Wheezing, Wheezing    Wheezing Other reaction(s): Wheezing Wheezing    Wheezing    Other reaction(s): Wheezing  Wheezing  Wheezing Other reaction(s): Wheezing Wheezing Wheezing Other reaction(s): Wheezing Wheezing Wheezing Other reaction(s): Wheezing Wheezing Wheezing Other reaction(s): Wheezing Wheezing Wheezing Other reaction(s): Wheezing  Wheezing Wheezing Other reaction(s): Wheezing Wheezing Wheezing Other reaction(s): Wheezing Wheezing  Whee... (TRUNCATED)  Wheezing Other reaction(s): Wheezing Wheezing   Gramineae Pollens Shortness Of Breath    wheezing   Misc. Sulfonamide Containing Compounds Anaphylaxis   Molds & Smuts Shortness Of Breath    wheezing  wheezing wheezing wheezing    wheezing, wheezing, wheezing  wheezing wheezing wheezing    wheezing  wheezing wheezing wheezing  wheezing, wheezing, wheezing    wheezing    wheezing wheezing wheezing  wheezing, wheezing, wheezing    wheezing, wheezing, wheezing   Other Anaphylaxis, Other (See Comments) and Shortness Of Breath    : Angioedema  wheezing  Wheezing  Other reaction(s): Cough  Itching and rash  Other reaction(s): Other (See Comments)   : Angioedema   Statins Anaphylaxis   Sulfa Antibiotics Anaphylaxis and Other (See Comments)    : Angioedema  : Angioedema  : Angioedema     : Angioedema    : Angioedema  : Angioedema   : Angioedema,  : Angioedema     : Angioedema,  : Angioedema   Sulfamethoxazole-Trimethoprim Anaphylaxis and Other (See Comments)   Trichophyton Shortness Of Breath    wheezing  wheezing    wheezing wheezing wheezing wheezing wheezing wheezing wheezing wheezing wheezing wheezing wheezing wheezing wheezing wheezing wheezing wheezing wheezing    wheezing, wheezing, wheezing, wheezing, wheezing, wheezing, wheezing, wheezing, wheezing, wheezing, wheezing, wheezing, wheezing,  wheezing, wheezing, wheezing, wheezing  wheezing wheezing wheezing wheezing wheezing wheezing wheezing wheezing wheezing wheezing wheezing wheezing wheezing wheezing wheezing wheezing wheezing    wheezing  wheezing  wheezing wheezing wheezing wheezing wheezing wheezing wheezing wheezing wheezing wheezing wheezing wheezing wheezing wheezing wheezing wheezing wheezing  wheezing, wheezing, wheezing, wheezing, wheezing, wheezing, wheezing, wheezing, wheezing, wheezing, wheezing, wheezing, wheezing, wheezing, wheezing, wheezing, wheezing    wheezing wheezing    wheezing  wheezing wheezing wheezing wheezing wheezing wheezing wheezing wheezing wheezing wheezing wheezing wheezing wheezing wheezing wheezing wheezing wheezing  wheezing, wheezing, wheezing, wheezing, wheezing, wheezing, wheezing, wheezing, wheezing, wheezing, wheezing, wheezing, wheezing, wheezing, wheezing, wheezing, wheezing    wheezing, wheezing, wheezing, wheezing, wheezing, wheezing, wheezing, wheezing, wheezing, wheezing, wheezing, wheezing, wheezing, wheezing, wheezing, wheezing, wheezing   Carbamazepine  Rash, Dermatitis and Other (See Comments)    Other Reaction(s): Other (See Comments)   Gluten Meal Itching and Other (See Comments)    Itching and rash Other reaction(s): Abdominal Pain Itching and rash Itching and rash    Itching and rash    Other reaction(s): Abdominal Pain  Itching and rash  Itching and rash Other reaction(s): Abdominal Pain Itching and rash Itching and rash  Itching and rash, Other reaction(s): Abdominal Pain, Itching and rash, Itching and rash    Itching and rash, Other reaction(s): Abdominal Pain, Itching and rash, Itching and rash   Quetiapine Other (See Comments)   Quetiapine Fumarate Other (See Comments)    Metabolic Disorder Labs: Lab Results  Component Value Date   HGBA1C 5.8 (A) 06/14/2024   MPG 131 03/18/2015   No results found for: PROLACTIN Lab Results  Component Value Date   CHOL 165  12/05/2023   TRIG 165 (H) 12/05/2023   HDL 49 12/05/2023   CHOLHDL 3.4 12/05/2023   VLDL 44 (H) 03/18/2015   LDLCALC 88 12/05/2023   LDLCALC 126 03/31/2022   Lab Results  Component Value Date   TSH 1.500 03/05/2024   TSH 1.030 10/27/2023    Therapeutic Level Labs: Lab Results  Component Value Date   LITHIUM  0.20 (L) 02/21/2012  No results found for: VALPROATE No results found for: CBMZ  Current Medications: Current Outpatient Medications  Medication Sig Dispense Refill   FLUoxetine  (PROZAC ) 20 MG capsule Take 1 capsule (20 mg total) by mouth daily. 30 capsule 2   acetaZOLAMIDE  (DIAMOX ) 250 MG tablet Take 1 tablet (250 mg total) by mouth daily for 14 days, THEN 1 tablet (250 mg total) 2 (two) times daily for 14 days, THEN 1 tablet (250 mg) in the morning and 2 tablets (500 mg) in the evening for 14 days, THEN 2 tablets (500 mg total) 2 (two) times daily. 180 tablet 3   albuterol  (VENTOLIN  HFA) 108 (90 Base) MCG/ACT inhaler Inhale 2 puffs into the lungs every 6 (six) hours as needed for wheezing and/or shortness of breath. 6.7 g 5   aspirin EC 81 MG tablet Take 1 tablet every day by oral route.     azelastine  (ASTELIN ) 0.1 % nasal spray Place 1 spray into both nostrils 2 (two) times daily. 30 mL 2   Blood Glucose Monitoring Suppl (ONETOUCH VERIO FLEX SYSTEM) w/Device KIT Use as directed to check blood glucose twice daily, before breakfast and before bed. 1 kit 0   Blood Glucose Monitoring Suppl (ONETOUCH VERIO) w/Device KIT Use to check glucose once daily 1 kit 0   buPROPion  (WELLBUTRIN  XL) 150 MG 24 hr tablet Take 1 tablet (150 mg total) by mouth every morning. 30 tablet 2   cetirizine  (ZYRTEC ) 10 MG tablet Take 10 mg by mouth at bedtime.     clonazePAM  (KLONOPIN ) 0.5 MG tablet Take 1 tablet (0.5 mg total) by mouth at bedtime. 30 tablet 2   Coenzyme Q10 (COQ10 PO) Take by mouth. One daily     Collagen Hydrolysate POWD by Does not apply route.     Continuous Glucose Sensor  (FREESTYLE LIBRE 3 PLUS SENSOR) MISC Change sensor every 15 days. 6 each 3   cycloSPORINE  (RESTASIS ) 0.05 % ophthalmic emulsion Place 1 drop into both eyes 2 (two) times daily. 180 each 99   dapagliflozin  propanediol (FARXIGA ) 10 MG TABS tablet Take 1 tablet (10 mg total) by mouth in the morning. 30 tablet 11   estradiol  (ESTRACE ) 0.1 MG/GM vaginal cream Place 1 Applicatorful vaginally 3 (three) times a week. 42.5 g 1   FLUoxetine  (PROZAC ) 40 MG capsule Take 1 capsule (40 mg total) by mouth daily. 30 capsule 2   fluticasone  (FLONASE ) 50 MCG/ACT nasal spray Place 1 spray into both nostrils daily. 16 g 3   Galcanezumab -gnlm (EMGALITY ) 120 MG/ML SOSY Inject 1 mL into the skin every 30 (thirty) days. 1 mL 11   Immune Globulin , Human,-klhw (XEMBIFY ) 10 GM/50ML SOLN Inject 10 g into the skin every 7 (seven) days. 200 mL 3   insulin  glargine (LANTUS ) 100 UNIT/ML Solostar Pen Inject 40 Units into the skin at bedtime. 36 mL 3   ipratropium-albuterol  (DUONEB) 0.5-2.5 (3) MG/3ML SOLN Take 3 mLs by nebulization 2 (two) times daily as needed. 90 mL 2   lamoTRIgine  (LAMICTAL ) 25 MG tablet Take 1 tablet (25 mg total) by mouth 2 (two) times daily. 60 tablet 2   Lancets (ONETOUCH ULTRASOFT) lancets Use to check blood sugar once daily 100 each 3   Latanoprostene Bunod  (VYZULTA ) 0.024 % SOLN Place 1 drop into both eyes at bedtime as directed 7.5 mL 3   lubiprostone  (AMITIZA ) 24 MCG capsule Take 1 capsule (24 mcg total) by mouth 2 (two) times daily. 60 capsule 3   Melatonin 10 MG TABS  omeprazole  (PRILOSEC) 20 MG capsule Take 1 capsule (20 mg total) by mouth 2 (two) times daily. 60 capsule 2   ondansetron  (ZOFRAN -ODT) 8 MG disintegrating tablet Dissolve 1 tablet (8 mg total) by mouth every 8 (eight) hours as needed for nausea or vomiting. 20 tablet 6   ONETOUCH VERIO test strip Use to check glucose once daily. 100 each 3   OVER THE COUNTER MEDICATION Magnessium 500 mg at night     pregabalin  (LYRICA ) 200 MG  capsule Take 1 capsule (200 mg total) by mouth 3 (three) times daily. 90 capsule 0   rosuvastatin  (CRESTOR ) 40 MG tablet Take 1 tablet (40 mg total) by mouth at bedtime. 90 tablet 1   sennosides-docusate sodium (SENOKOT-S) 8.6-50 MG tablet Take 2 tablets by mouth at bedtime.     tiZANidine  (ZANAFLEX ) 4 MG tablet Take 1 tablet (4 mg total) by mouth every 12 (twelve) hours as needed. 60 tablet 11   traMADol (ULTRAM) 50 MG tablet TAKE 1 TABLET EVERY 4 TO 6 HOURS BY MOUTH (Patient not taking: Reported on 05/31/2024)     traZODone  (DESYREL ) 50 MG tablet Take 1 tablet (50 mg total) by mouth at bedtime. 30 tablet 2   UNABLE TO FIND 1 each by Does not apply route daily. Med Name: CPAP SUPPLIES Full face mask Cushion for mask -Medium ResMed 1 each 11   VITAMIN D  PO Take 5,000 Units by mouth daily.     zinc gluconate 50 MG tablet Take 50 mg by mouth daily.     No current facility-administered medications for this visit.     Musculoskeletal: Strength & Muscle Tone: within normal limits Gait & Station: normal Patient leans: N/A  Psychiatric Specialty Exam: Review of Systems  Constitutional:  Positive for fatigue.  Musculoskeletal:  Positive for arthralgias and myalgias.  Psychiatric/Behavioral:  Positive for dysphoric mood. The patient is nervous/anxious.   All other systems reviewed and are negative.   Last menstrual period 01/04/2018.There is no height or weight on file to calculate BMI.  General Appearance: Casual and Fairly Groomed  Eye Contact:  Good  Speech:  Clear and Coherent  Volume:  Normal  Mood:  Anxious and Dysphoric  Affect:  Congruent  Thought Process:  Goal Directed  Orientation:  Full (Time, Place, and Person)  Thought Content: Obsessions and Rumination   Suicidal Thoughts:  No  Homicidal Thoughts:  No  Memory:  Immediate;   Good Recent;   Good Remote;   Fair  Judgement:  Good  Insight:  Good  Psychomotor Activity:  Decreased  Concentration:  Concentration: Good and  Attention Span: Good  Recall:  Good  Fund of Knowledge: Good  Language: Good  Akathisia:  No  Handed:  Right  AIMS (if indicated): not done  Assets:  Communication Skills Desire for Improvement Resilience Social Support  ADL's:  Intact  Cognition: WNL  Sleep:  Good   Screenings: GAD-7    Flowsheet Row Video Visit from 05/23/2024 in [facility] Office Visit from 03/08/2024 in [facility] Telemedicine from 01/25/2024 in [facility] Office Visit from 12/05/2023 in [facility]  Total GAD-7 Score 0 13 10 10    Mini-Mental    Flowsheet Row Office Visit from 08/29/2019 in [facility]  Total Score (max 30 points ) 27   PHQ2-9    Flowsheet Row Video Visit from 05/23/2024 in [facility] Office Visit from 03/08/2024  in [facility] Care Telemedicine from 01/25/2024 in [facility] Office Visit from 12/05/2023 in [facility] Video Visit from 11/04/2022 in [facility] Total Score 0 4 2 2 2   PHQ-9 Total Score 0 17 13 13 10    Flowsheet Row Admission (Discharged) from 02/16/2024 in [facility] Video Visit from 11/04/2022 in [facility] [place] Video Visit from 08/23/2022 in [facility] Out[facility] at [place]  C-SSRS RISK CATEGORY No Risk Error: Question 6 not populated No Risk     Assessment and Plan: This patient is a 58 year old female with history of PTSD possible bipolar disorder depression anxiety and hallucinations in the past.  She also has chronic fatigue and immune deficiency disorder.  She is back on trazodone  50 mg at bedtime to help with sleep.  Because she is having more obsessional thoughts and worries we will increase Prozac  to 60 mg daily.  She will continue Wellbutrin   XL 150 mg daily for depression as well  Collaboration of Care: Collaboration of Care: Referral or follow-up with counselor/therapist AEB patient will be referred to Winton Rubinstein for therapy  Patient/Guardian was advised Release of Information must be obtained prior to any record release in order to collaborate their care with an outside provider. Patient/Guardian was advised if they have not already done so to contact the registration department to sign all necessary forms in order for us  to release information regarding their care.   Consent: Patient/Guardian gives verbal consent for treatment and assignment of benefits for services provided during this visit. Patient/Guardian expressed understanding and agreed to proceed.    [name], MD 07/02/2024, 11:23 AM

## 2024-07-03 ENCOUNTER — Other Ambulatory Visit (HOSPITAL_COMMUNITY): Payer: Self-pay

## 2024-07-03 ENCOUNTER — Other Ambulatory Visit: Payer: Self-pay

## 2024-07-05 ENCOUNTER — Other Ambulatory Visit: Payer: Self-pay

## 2024-07-08 DIAGNOSIS — M6289 Other specified disorders of muscle: Secondary | ICD-10-CM | POA: Diagnosis not present

## 2024-07-08 DIAGNOSIS — K5902 Outlet dysfunction constipation: Secondary | ICD-10-CM | POA: Diagnosis not present

## 2024-07-09 ENCOUNTER — Other Ambulatory Visit (HOSPITAL_COMMUNITY): Payer: Self-pay

## 2024-07-09 ENCOUNTER — Telehealth: Payer: Self-pay | Admitting: Gastroenterology

## 2024-07-09 ENCOUNTER — Other Ambulatory Visit: Payer: Self-pay

## 2024-07-09 ENCOUNTER — Other Ambulatory Visit: Payer: Self-pay | Admitting: Family Medicine

## 2024-07-09 DIAGNOSIS — R42 Dizziness and giddiness: Secondary | ICD-10-CM | POA: Diagnosis not present

## 2024-07-09 DIAGNOSIS — D801 Nonfamilial hypogammaglobulinemia: Secondary | ICD-10-CM | POA: Diagnosis not present

## 2024-07-09 DIAGNOSIS — D839 Common variable immunodeficiency, unspecified: Secondary | ICD-10-CM | POA: Diagnosis not present

## 2024-07-09 DIAGNOSIS — M9902 Segmental and somatic dysfunction of thoracic region: Secondary | ICD-10-CM | POA: Diagnosis not present

## 2024-07-09 DIAGNOSIS — M47816 Spondylosis without myelopathy or radiculopathy, lumbar region: Secondary | ICD-10-CM | POA: Diagnosis not present

## 2024-07-09 DIAGNOSIS — S233XXA Sprain of ligaments of thoracic spine, initial encounter: Secondary | ICD-10-CM | POA: Diagnosis not present

## 2024-07-09 DIAGNOSIS — M9901 Segmental and somatic dysfunction of cervical region: Secondary | ICD-10-CM | POA: Diagnosis not present

## 2024-07-09 DIAGNOSIS — M9903 Segmental and somatic dysfunction of lumbar region: Secondary | ICD-10-CM | POA: Diagnosis not present

## 2024-07-09 NOTE — Telephone Encounter (Signed)
 Referral sent, they will contact patient with apt

## 2024-07-09 NOTE — Telephone Encounter (Signed)
 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  I explained this to the patient, she will be interested in having pelvic floor therapy evaluation.  Will refer her to [place].  I also emphasized the importance of undergoing colonoscopy as discussed in the past, she is agreeable to proceed with this.  Hi Ann, can you please refer the patient to pelvic floor therapy at [facility]?  Diagnosis dyssynergic defecation  Hi Mindy/Tammy, can you please schedule a colonoscopy? Dx: Change in bowel habits. Room: 3  Thanks,  Toribio Fortune, MD Gastroenterology and Hepatology [facility]

## 2024-07-10 ENCOUNTER — Other Ambulatory Visit: Payer: Self-pay

## 2024-07-10 ENCOUNTER — Other Ambulatory Visit (HOSPITAL_COMMUNITY): Payer: Self-pay

## 2024-07-10 LAB — COMPREHENSIVE METABOLIC PANEL WITH GFR
ALT: 50 IU/L — ABNORMAL HIGH (ref 0–32)
AST: 36 IU/L (ref 0–40)
Albumin: 5.1 g/dL — ABNORMAL HIGH (ref 3.8–4.9)
Alkaline Phosphatase: 99 IU/L (ref 44–121)
BUN/Creatinine Ratio: 16 (ref 9–23)
BUN: 22 mg/dL (ref 6–24)
Bilirubin Total: 0.4 mg/dL (ref 0.0–1.2)
CO2: 19 mmol/L — ABNORMAL LOW (ref 20–29)
Calcium: 10.2 mg/dL (ref 8.7–10.2)
Chloride: 108 mmol/L — ABNORMAL HIGH (ref 96–106)
Creatinine, Ser: 1.34 mg/dL — ABNORMAL HIGH (ref 0.57–1.00)
Globulin, Total: 2.2 g/dL (ref 1.5–4.5)
Glucose: 101 mg/dL — ABNORMAL HIGH (ref 70–99)
Potassium: 4.8 mmol/L (ref 3.5–5.2)
Sodium: 140 mmol/L (ref 134–144)
Total Protein: 7.3 g/dL (ref 6.0–8.5)
eGFR: 47 mL/min/1.73 — ABNORMAL LOW (ref >59)

## 2024-07-10 LAB — IGG, IGA, IGM
IgA/Immunoglobulin A, Serum: 53 mg/dL — ABNORMAL LOW (ref 87–352)
IgG (Immunoglobin G), Serum: 724 mg/dL (ref 586–1602)
IgM (Immunoglobulin M), Srm: 11 mg/dL — ABNORMAL LOW (ref 26–217)

## 2024-07-10 LAB — CBC WITH DIFFERENTIAL/PLATELET
Basophils Absolute: 0.1 x10E3/uL (ref 0.0–0.2)
Basos: 1 %
EOS (ABSOLUTE): 0.2 x10E3/uL (ref 0.0–0.4)
Eos: 2 %
Hematocrit: 48.9 % — ABNORMAL HIGH (ref 34.0–46.6)
Hemoglobin: 16 g/dL — ABNORMAL HIGH (ref 11.1–15.9)
Immature Grans (Abs): 0 x10E3/uL (ref 0.0–0.1)
Immature Granulocytes: 0 %
Lymphocytes Absolute: 2.4 x10E3/uL (ref 0.7–3.1)
Lymphs: 26 %
MCH: 32.3 pg (ref 26.6–33.0)
MCHC: 32.7 g/dL (ref 31.5–35.7)
MCV: 99 fL — ABNORMAL HIGH (ref 79–97)
Monocytes Absolute: 0.7 x10E3/uL (ref 0.1–0.9)
Monocytes: 7 %
Neutrophils Absolute: 6 x10E3/uL (ref 1.4–7.0)
Neutrophils: 64 %
Platelets: 294 x10E3/uL (ref 150–450)
RBC: 4.95 x10E6/uL (ref 3.77–5.28)
RDW: 12.5 % (ref 11.7–15.4)
WBC: 9.4 x10E3/uL (ref 3.4–10.8)

## 2024-07-10 MED ORDER — PEG 3350-KCL-NA BICARB-NACL 420 G PO SOLR
4000.0000 mL | Freq: Once | ORAL | 0 refills | Status: AC
  Filled 2024-07-10: qty 4000, 1d supply, fill #0

## 2024-07-10 NOTE — Telephone Encounter (Signed)
 Spoke with pt. Scheduled for 8/26. Aware will send instructions and pre-op appt to her mychart. Rx for prep sent to [facility] outpatient pharmacy

## 2024-07-10 NOTE — Addendum Note (Signed)
 Addended by: JEANELL GRAEME RAMAN on: 07/10/2024 08:55 AM   Modules accepted: Orders

## 2024-07-11 ENCOUNTER — Other Ambulatory Visit: Payer: Self-pay

## 2024-07-11 ENCOUNTER — Encounter: Payer: Self-pay | Admitting: Physician Assistant

## 2024-07-11 NOTE — Telephone Encounter (Signed)
 Dr. Davonna, are you able to weigh in on this since Rebekah is out of the office?

## 2024-07-15 ENCOUNTER — Other Ambulatory Visit: Payer: Self-pay

## 2024-07-16 ENCOUNTER — Other Ambulatory Visit: Payer: Self-pay

## 2024-07-16 ENCOUNTER — Other Ambulatory Visit (HOSPITAL_COMMUNITY): Payer: Self-pay

## 2024-07-17 ENCOUNTER — Other Ambulatory Visit: Payer: Self-pay

## 2024-07-17 DIAGNOSIS — G894 Chronic pain syndrome: Secondary | ICD-10-CM | POA: Diagnosis not present

## 2024-07-17 DIAGNOSIS — M47894 Other spondylosis, thoracic region: Secondary | ICD-10-CM | POA: Diagnosis not present

## 2024-07-17 DIAGNOSIS — M47812 Spondylosis without myelopathy or radiculopathy, cervical region: Secondary | ICD-10-CM | POA: Diagnosis not present

## 2024-07-18 ENCOUNTER — Inpatient Hospital Stay: Attending: Physician Assistant | Admitting: Oncology

## 2024-07-18 ENCOUNTER — Other Ambulatory Visit: Payer: Self-pay

## 2024-07-18 VITALS — BP 107/58 | HR 63 | Temp 98.8°F | Resp 20 | Wt 224.9 lb

## 2024-07-18 DIAGNOSIS — D801 Nonfamilial hypogammaglobulinemia: Secondary | ICD-10-CM | POA: Diagnosis not present

## 2024-07-18 DIAGNOSIS — Z87891 Personal history of nicotine dependence: Secondary | ICD-10-CM | POA: Insufficient documentation

## 2024-07-18 DIAGNOSIS — D582 Other hemoglobinopathies: Secondary | ICD-10-CM

## 2024-07-18 DIAGNOSIS — R7309 Other abnormal glucose: Secondary | ICD-10-CM | POA: Insufficient documentation

## 2024-07-18 DIAGNOSIS — Z87440 Personal history of urinary (tract) infections: Secondary | ICD-10-CM | POA: Diagnosis not present

## 2024-07-18 NOTE — Progress Notes (Signed)
 [facility] [address], [place] [address]   CLINIC:  Medical Oncology/Hematology  PCP:  Bevely Doffing, FNP [address] [place] [address] [phone]   REASON FOR VISIT:  Follow-up for hypogammaglobulinemia  PRIOR THERAPY: Monthly IVIG  CURRENT THERAPY: Subcutaneous IgG (Xembify ) every 2 weeks  INTERVAL HISTORY:   Ms. Brophy 58 y.o. female returns for routine follow-up of hypogammaglobulinemia.    Since her last visit, she was evaluated by rheumatology, behavioral health, gastroenterology and her PCP for various concerns.  Dr. Eartha performed anorectal manometry on 07/01/2024 which showed type II dyssynergia and he recommended pelvic floor therapy.  She is not interested at this time.    She has past medical history significant for high cholesterol and type 2 diabetes and is followed closely by her PCP.  Patient received IVIG in 2015 and 2016 with poor toleration.  She is currently on Xembify  every 2 weeks subcutaneous which she gives herself at home.  She has occasional fatigue.  At today's visit, she reports feeling fair.  She continues to have some ongoing symptoms related to her fibromyalgia and other conditions - including body aches, headaches, and fatigue.  She is being worked up by rheumatology and is following with Dr. Lateef for her CKD stage IIIa.  She denies any bacterial infections or antibiotics within the past 6 months.  She denies any unexplained fevers, chills, night sweats, or weight loss.  She has 25% energy and 100% appetite.  She is maintaining a stable weight.  ASSESSMENT & PLAN:   Assessment & Plan Hypogammaglobulinemia (HCC) - Labs from October 2015 showed IgG 566 ([phone]), IgM 28 (57-237), IgA 65, IgE 12 - Evaluated by [facility] by Dr. Bernard (10/27/2014), who noted:  A review of Ms. Lua's immune evaluation reveals mild hypogammaglobulinemia (not significant), a low IgM but normal specific  antibody titers, normal B cell subsets panels and normal cellular function. Taken together the immune evaluation is normal. I do not believe Ms. Whitwell has a functional disorder of her immune system. For the hypo-IgM this may become significant if the IgM was less that 15. For this I suggest she have an annual IgG, IgM and IgA. Currently the IgM level is not likely to be significant. 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 Federico, with first dose on 11/03/2021 -No infections or antibiotics over the past 3 months.  No B symptoms. - She reported increasing weakness, fatigue, nausea, and myalgia/arthralgia after IVIG infusions (Gamunex-C 40 g/400 mL q 4 weeks). - Switched to subcutaneous IgG (Xembify  10g/15 mL SQ weekly) in June 2024, which she is tolerating better, although she does continue to have side effects of fatigue, headache, and diarrhea - Dose decreased to Xembify  10g/15 mL SQ every 2 weeks as of November 2024 to see if better tolerated.    - Most recent labs from 07/09/2024 show very stable immunoglobulins.  CMP is shows mildly elevated creatinine with GFR 47.  CBC shows hemoglobin 16 with hematocrit of 48.9.  MCV 99.  The rest of CBC unremarkable. - Patient receives home infusions via [facility] (contact [place] Sandy at [phone], fax [phone], email [email or web address]) - Will check monthly quantitative immunoglobulin trough (day before Xembify ) along with CBC/D and CMP.   - Therapeutic goal is IgG around 600. -  Labs from 07/09/2024 shows stable persistent elevation in her kidney function (1.34), slightly elevated ALT and hemoglobin 16 (15.5).  Quantitative IgG is 724.  - OFFICE visit in 3 months - Educational information regarding common side effects and risks has been provided to patient.   We discussed the importance of hydration due to risk of kidney injury.  Elevated hemoglobin (HCC) Hemoglobin 16.0 hematocrit 48.9. We discussed hemoglobin has slowly trended up over the past couple of years. If it continues to be elevated, would perform polycythemia workup. We discussed lots of reasons for why her hemoglobin could be elevated including sleep apnea and she is currently on Farxiga . Reports she has had sleep apnea for over 15 years and needs a new facemask.  Reports her machine is also very old.  States she is going to contact company to see if she can get a new mask to see if that helps.  Will recheck his labs at her next visit.   PLAN SUMMARY:  >> Continue Xembify  every 2 weeks at home >> Monthly TROUGH labs to be obtained at [facility] = quantitative immunoglobulins, CBC/D, CMP >> OFFICE visit in 3 months     REVIEW OF SYSTEMS:   Review of Systems  Respiratory:  Positive for chest tightness and shortness of breath.   Gastrointestinal:  Positive for constipation, diarrhea and nausea.  Musculoskeletal:  Positive for arthralgias and myalgias.  Neurological:  Positive for dizziness.  Psychiatric/Behavioral:  Positive for depression. The patient is nervous/anxious.      PHYSICAL EXAM:  ECOG PERFORMANCE STATUS: 1 - Symptomatic but completely ambulatory  There were no vitals filed for this visit.  There were no vitals filed for this visit.  Physical Exam Constitutional:      Appearance: Normal appearance.  Cardiovascular:     Rate and Rhythm: Normal rate and regular rhythm.  Pulmonary:     Effort: Pulmonary effort is normal.     Breath sounds: Normal breath sounds.  Abdominal:     General: Bowel sounds are normal.     Palpations: Abdomen is soft.  Musculoskeletal:        General: No swelling. Normal range of motion.  Neurological:     Mental Status: She is alert and oriented to person, place, and time. Mental status is at baseline.    PAST MEDICAL/SURGICAL  HISTORY:  Past Medical History:  Diagnosis Date   Allergic rhinitis    Anemia    Arthritis    Lower back and hips   Asthma    Bipolar disorder (HCC)    Bronchitis 05/2024   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HCC)    Constipation    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HCC)    Depression    Essential hypertension, benign    Fibromyalgia    GERD (gastroesophageal reflux disease)    H/O sleep apnea    Hip pain, left    History of palpitations    Negative Holter monitor   History of pneumonia 12/05/1986   Hyperlipidemia    IBS (irritable bowel syndrome)    Nausea and vomiting 02/16/2024   Neck pain    Obstructive sleep apnea (adult) (pediatric) 04/12/2021   Poor short term memory    PTSD (post-traumatic stress disorder)    S/P Botox injection 11/04/2014   For migraine headaches   Type 2 diabetes mellitus (HCC)    Urgency of urination    Past Surgical History:  Procedure Laterality Date   CARPAL TUNNEL RELEASE Right 06/10/2013   Procedure: CARPAL TUNNEL  RELEASE;  Surgeon: Darina MALVA Boehringer, MD;  Location: [facility];  Service: Neurosurgery;  Laterality: Right;  Right Carpal Tunnel Release    CHOLECYSTECTOMY     DENTAL SURGERY  12/05/2014   ESOPHAGOGASTRODUODENOSCOPY (EGD) WITH PROPOFOL  N/A 02/16/2024   Procedure: ESOPHAGOGASTRODUODENOSCOPY (EGD) WITH PROPOFOL ;  Surgeon: Eartha Angelia Sieving, MD;  Location: [facility];  Service: Gastroenterology;  Laterality: N/A;  11:30am;ASA 1   mole exc     x 3   MUSCLE BIOPSY  12/05/2006   Right leg   POLYPECTOMY  02/16/2024   Procedure: POLYPECTOMY;  Surgeon: Eartha Angelia, Sieving, MD;  Location: [facility];  Service: Gastroenterology;;   HARLEY DILATION  02/16/2024   Procedure: EGD, WITH DILATION USING SAVARY-GILLIARD DILATOR OVER GUIDEWIRE;  Surgeon: Eartha Angelia, Sieving, MD;  Location: [facility];  Service: Gastroenterology;;   TRIGGER FINGER RELEASE Right 03/2024   thumb   TUBAL LIGATION      SOCIAL  HISTORY:  Social History   Socioeconomic History   Marital status: Married    Spouse name: Not on file   Number of children: 2   Years of education: College   Highest education level: Associate degree: academic program  Occupational History   Occupation: Chiropractor: UNEMPLOYED    Comment: Now disabled due to bipolar and fibromyalgia  Tobacco Use   Smoking status: Former    Current packs/day: 0.00    Types: Cigarettes    Quit date: 11/04/2010    Years since quitting: 13.7    Passive exposure: Past   Smokeless tobacco: Never  Vaping Use   Vaping status: Never Used  Substance and Sexual Activity   Alcohol use: Yes    Comment: one mixed drink per month   Drug use: No   Sexual activity: Yes    Birth control/protection: Surgical  Other Topics Concern   Not on file  Social History Narrative   Married   Lives with spouse and daughter   Right handed.   Caffeine use: 2 cups coffee in morning and 2 cups tea during the day    Social Drivers of Health   Financial Resource Strain: Low Risk  (05/23/2024)   Overall Financial Resource Strain (CARDIA)    Difficulty of Paying Living Expenses: Not very hard  Food Insecurity: No Food Insecurity (05/23/2024)   Hunger Vital Sign    Worried About Running Out of Food in the Last Year: Never true    Ran Out of Food in the Last Year: Never true  Transportation Needs: No Transportation Needs (05/23/2024)   PRAPARE - Administrator, Civil Service (Medical): No    Lack of Transportation (Non-Medical): No  Physical Activity: Insufficiently Active (05/23/2024)   Exercise Vital Sign    Days of Exercise per Week: 1 day    Minutes of Exercise per Session: 10 min  Stress: Stress Concern Present (05/23/2024)   [employer] of Occupational Health - Occupational Stress Questionnaire    Feeling of Stress: Rather much  Social Connections: Moderately Isolated (05/23/2024)   Social Connection and Isolation Panel    Frequency of  Communication with Friends and Family: Twice a week    Frequency of Social Gatherings with Friends and Family: More than three times a week    Attends Religious Services: Never    Database administrator or Organizations: No    Attends Engineer, structural: Not on file    Marital Status: Married  Catering manager Violence:  Unknown (03/10/2022)   Received from [facility]   HITS    Physically Hurt: Not on file    Insult or Talk Down To: Not on file    Threaten Physical Harm: Not on file    Scream or Curse: Not on file    FAMILY HISTORY:  Family History  Problem Relation Age of Onset   Fibromyalgia Mother    Diabetes type II Mother    Depression Mother    Alzheimer's disease Mother    GER disease Mother    Heart disease Mother    Paranoid behavior Mother    Dementia Mother    Heart attack Father 53       MI x5   Stroke Father        CVA x7   Asthma Father    Brain cancer Father    Seizures Father    Anxiety disorder Sister    OCD Sister    Sexual abuse Sister    Physical abuse Sister    Anxiety disorder Sister    Fibromyalgia Sister    Other Sister        Temporomandibular Joint   ADD / ADHD Daughter    ADD / ADHD Daughter    Alcohol abuse Neg Hx    Drug abuse Neg Hx    Schizophrenia Neg Hx     CURRENT MEDICATIONS:  Outpatient Encounter Medications as of 07/18/2024  Medication Sig   acetaZOLAMIDE  (DIAMOX ) 250 MG tablet Take 1 tablet (250 mg total) by mouth daily for 14 days, THEN 1 tablet (250 mg total) 2 (two) times daily for 14 days, THEN 1 tablet (250 mg) in the morning and 2 tablets (500 mg) in the evening for 14 days, THEN 2 tablets (500 mg total) 2 (two) times daily.   albuterol  (VENTOLIN  HFA) 108 (90 Base) MCG/ACT inhaler Inhale 2 puffs into the lungs every 6 (six) hours as needed for wheezing and/or shortness of breath.   aspirin EC 81 MG tablet Take 1 tablet every day by oral route.   azelastine  (ASTELIN ) 0.1 % nasal spray Place 1 spray into both  nostrils 2 (two) times daily.   Blood Glucose Monitoring Suppl (ONETOUCH VERIO FLEX SYSTEM) w/Device KIT Use as directed to check blood glucose twice daily, before breakfast and before bed.   Blood Glucose Monitoring Suppl (ONETOUCH VERIO) w/Device KIT Use to check glucose once daily   buPROPion  (WELLBUTRIN  XL) 150 MG 24 hr tablet Take 1 tablet (150 mg total) by mouth every morning.   cetirizine  (ZYRTEC ) 10 MG tablet Take 10 mg by mouth at bedtime.   clonazePAM  (KLONOPIN ) 0.5 MG tablet Take 1 tablet (0.5 mg total) by mouth at bedtime.   Coenzyme Q10 (COQ10 PO) Take by mouth. One daily   Collagen Hydrolysate POWD by Does not apply route.   Continuous Glucose Sensor (FREESTYLE LIBRE 3 PLUS SENSOR) MISC Change sensor every 15 days.   cycloSPORINE  (RESTASIS ) 0.05 % ophthalmic emulsion Place 1 drop into both eyes 2 (two) times daily.   dapagliflozin  propanediol (FARXIGA ) 10 MG TABS tablet Take 1 tablet (10 mg total) by mouth in the morning.   estradiol  (ESTRACE ) 0.1 MG/GM vaginal cream Place 1 Applicatorful vaginally 3 (three) times a week.   FLUoxetine  (PROZAC ) 20 MG capsule Take 1 capsule (20 mg total) by mouth daily.   FLUoxetine  (PROZAC ) 40 MG capsule Take 1 capsule (40 mg total) by mouth daily.   fluticasone  (FLONASE ) 50 MCG/ACT nasal spray Place 1 spray into both  nostrils daily.   Galcanezumab -gnlm (EMGALITY ) 120 MG/ML SOSY Inject 1 mL into the skin every 30 (thirty) days.   Immune Globulin , Human,-klhw (XEMBIFY ) 10 GM/50ML SOLN Inject 10 g into the skin every 7 (seven) days.   insulin  glargine (LANTUS ) 100 UNIT/ML Solostar Pen Inject 40 Units into the skin at bedtime.   ipratropium-albuterol  (DUONEB) 0.5-2.5 (3) MG/3ML SOLN Take 3 mLs by nebulization 2 (two) times daily as needed.   lamoTRIgine  (LAMICTAL ) 25 MG tablet Take 1 tablet (25 mg total) by mouth 2 (two) times daily.   Lancets (ONETOUCH ULTRASOFT) lancets Use to check blood sugar once daily   Latanoprostene Bunod  (VYZULTA ) 0.024 % SOLN  Place 1 drop into both eyes at bedtime as directed   lubiprostone  (AMITIZA ) 24 MCG capsule Take 1 capsule (24 mcg total) by mouth 2 (two) times daily.   Melatonin 10 MG TABS    omeprazole  (PRILOSEC) 20 MG capsule Take 1 capsule (20 mg total) by mouth 2 (two) times daily.   ondansetron  (ZOFRAN -ODT) 8 MG disintegrating tablet Dissolve 1 tablet (8 mg total) by mouth every 8 (eight) hours as needed for nausea or vomiting.   ONETOUCH VERIO test strip Use to check glucose once daily.   OVER THE COUNTER MEDICATION Magnessium 500 mg at night   pregabalin  (LYRICA ) 200 MG capsule Take 1 capsule (200 mg total) by mouth 3 (three) times daily.   rosuvastatin  (CRESTOR ) 40 MG tablet Take 1 tablet (40 mg total) by mouth at bedtime.   sennosides-docusate sodium (SENOKOT-S) 8.6-50 MG tablet Take 2 tablets by mouth at bedtime.   tiZANidine  (ZANAFLEX ) 4 MG tablet Take 1 tablet (4 mg total) by mouth every 12 (twelve) hours as needed.   traMADol (ULTRAM) 50 MG tablet TAKE 1 TABLET EVERY 4 TO 6 HOURS BY MOUTH (Patient not taking: Reported on 05/31/2024)   traZODone  (DESYREL ) 50 MG tablet Take 1 tablet (50 mg total) by mouth at bedtime.   UNABLE TO FIND 1 each by Does not apply route daily. Med Name: CPAP SUPPLIES Full face mask Cushion for mask -Medium ResMed   VITAMIN D  PO Take 5,000 Units by mouth daily.   zinc gluconate 50 MG tablet Take 50 mg by mouth daily.   [DISCONTINUED] rOPINIRole  (REQUIP ) 0.25 MG tablet Take 1 tablet (0.25 mg total) by mouth 2 (two) times daily.   [DISCONTINUED] SUMAtriptan  (IMITREX ) 100 MG tablet Take 1 tablet by mouth as needed for migraine. If migraine continues, may repeat dose after 2 hours.   No facility-administered encounter medications on file as of 07/18/2024.    ALLERGIES:  Allergies  Allergen Reactions   Dust Mite Extract Cough, Other (See Comments) and Shortness Of Breath    Wheezing  Other Reaction(s): Cough, Other (See Comments)  Other reaction(s): Wheezing     Wheezing    Wheezing Other reaction(s): Wheezing Wheezing Wheezing Other reaction(s): Wheezing Wheezing Wheezing Other reaction(s): Wheezing Wheezing Wheezing Other reaction(s): Wheezing Wheezing Wheezing Other reaction(s): Wheezing Wheezing Wheezing Other reaction(s): Wheezing Wheezing Wheezing Other reaction(s): Wheezing Wheezing    Wheezing, Other reaction(s): Wheezing, Wheezing, Wheezing, Other reaction(s): Wheezing, Wheezing, Wheezing, Other reaction(s): Wheezing, Wheezing, Wheezing, Other reaction(s): Wheezing, Wheezing, Wheezing, Other reaction(s): Wheezing, Wheezing, Wheezing, Other reaction(s): Wheezing, Wheezing, Wheezing, Other reaction(s): Wheezing, Wheezing  Wheezing Other reaction(s): Wheezing Wheezing Wheezing Other reaction(s): Wheezing Wheezing Wheezing Other reaction(s): Wheezing Wheezing Wheezing Other reaction(s): Wheezing Wheezing Wheezing Other reaction(s): Wheezing Wheezing Wheezing Other reaction(s): Wheezing Wheezing Wheezing Other reaction(s): Wheezing Wheezing    Wheezing  Other Reaction(s): Cough, Other (  See Comments)  Other reaction(s): Wheezing  Wheezing  Wheezing Other reaction(s): Wheezing Wheezing Wheezing Other reaction(s): Wheezing Wheezing Wheezing Other reaction(s): Wheezing Wheezing Wheezing Other reaction(s): Wheezing Wheezing Wheezing Other reaction(s): Wheezing Wheezing Wheezing Other reaction(s): Wheezing Wheezing Wheezing Other reaction(s): Wheezing Wheezing  Wheezing, Other reaction(s): Wheezing, Wheezing, Wheezing, Other reaction(s): Wheezing, Wheezing, Wheezing, Other reaction(s): Wheezing, Wheezing, Wheezing, Other reaction(s): Wheezing, Wheezing, Wheezing, Other reaction(s): Wheezing, Wheezing, Wheezing, Other reaction(s): Wheezing, Wheezing, Wheezing, Other reaction(s): Wheezing, Wheezing    Wheezing Other reaction(s): Wheezing Wheezing    Wheezing    Other reaction(s): Wheezing  Wheezing  Wheezing Other reaction(s): Wheezing Wheezing Wheezing Other  reaction(s): Wheezing Wheezing Wheezing Other reaction(s): Wheezing Wheezing Wheezing Other reaction(s): Wheezing Wheezing Wheezing Other reaction(s): Wheezing Wheezing Wheezing Other reaction(s): Wheezing Wheezing Wheezing Other reaction(s): Wheezing Wheezing  Whee... (TRUNCATED)  Wheezing Other reaction(s): Wheezing Wheezing   Gramineae Pollens Shortness Of Breath    wheezing   Misc. Sulfonamide Containing Compounds Anaphylaxis   Molds & Smuts Shortness Of Breath    wheezing  wheezing wheezing wheezing    wheezing, wheezing, wheezing  wheezing wheezing wheezing    wheezing  wheezing wheezing wheezing  wheezing, wheezing, wheezing    wheezing    wheezing wheezing wheezing  wheezing, wheezing, wheezing    wheezing, wheezing, wheezing   Other Anaphylaxis, Other (See Comments) and Shortness Of Breath    : Angioedema  wheezing  Wheezing  Other reaction(s): Cough  Itching and rash  Other reaction(s): Other (See Comments)   : Angioedema   Statins Anaphylaxis   Sulfa Antibiotics Anaphylaxis and Other (See Comments)    : Angioedema  : Angioedema  : Angioedema     : Angioedema    : Angioedema  : Angioedema   : Angioedema,  : Angioedema     : Angioedema,  : Angioedema   Sulfamethoxazole-Trimethoprim Anaphylaxis and Other (See Comments)   Trichophyton Shortness Of Breath    wheezing  wheezing    wheezing wheezing wheezing wheezing wheezing wheezing wheezing wheezing wheezing wheezing wheezing wheezing wheezing wheezing wheezing wheezing wheezing    wheezing, wheezing, wheezing, wheezing, wheezing, wheezing, wheezing, wheezing, wheezing, wheezing, wheezing, wheezing, wheezing, wheezing, wheezing, wheezing, wheezing  wheezing wheezing wheezing wheezing wheezing wheezing wheezing wheezing wheezing wheezing wheezing wheezing wheezing wheezing wheezing wheezing wheezing    wheezing  wheezing  wheezing wheezing wheezing wheezing wheezing wheezing wheezing wheezing wheezing  wheezing wheezing wheezing wheezing wheezing wheezing wheezing wheezing  wheezing, wheezing, wheezing, wheezing, wheezing, wheezing, wheezing, wheezing, wheezing, wheezing, wheezing, wheezing, wheezing, wheezing, wheezing, wheezing, wheezing    wheezing wheezing    wheezing  wheezing wheezing wheezing wheezing wheezing wheezing wheezing wheezing wheezing wheezing wheezing wheezing wheezing wheezing wheezing wheezing wheezing  wheezing, wheezing, wheezing, wheezing, wheezing, wheezing, wheezing, wheezing, wheezing, wheezing, wheezing, wheezing, wheezing, wheezing, wheezing, wheezing, wheezing    wheezing, wheezing, wheezing, wheezing, wheezing, wheezing, wheezing, wheezing, wheezing, wheezing, wheezing, wheezing, wheezing, wheezing, wheezing, wheezing, wheezing   Carbamazepine  Rash, Dermatitis and Other (See Comments)    Other Reaction(s): Other (See Comments)   Gluten Meal Itching and Other (See Comments)    Itching and rash Other reaction(s): Abdominal Pain Itching and rash Itching and rash    Itching and rash    Other reaction(s): Abdominal Pain  Itching and rash  Itching and rash Other reaction(s): Abdominal Pain Itching and rash Itching and rash  Itching and rash, Other reaction(s): Abdominal Pain, Itching and rash, Itching and rash    Itching and rash, Other reaction(s): Abdominal Pain, Itching and rash,  Itching and rash   Quetiapine Other (See Comments)   Quetiapine Fumarate Other (See Comments)    LABORATORY DATA:  I have reviewed the labs as listed.  CBC    Component Value Date/Time   WBC 9.4 07/09/2024 1431   WBC 7.6 10/02/2023 0948   RBC 4.95 07/09/2024 1431   RBC 4.77 10/02/2023 0948   HGB 16.0 (H) 07/09/2024 1431   HCT 48.9 (H) 07/09/2024 1431   PLT 294 07/09/2024 1431   MCV 99 (H) 07/09/2024 1431   MCH 32.3 07/09/2024 1431   MCH 30.2 10/02/2023 0948   MCHC 32.7 07/09/2024 1431   MCHC 33.8 10/02/2023 0948   RDW 12.5 07/09/2024 1431   LYMPHSABS 2.4 07/09/2024 1431    MONOABS 0.6 10/02/2023 0948   EOSABS 0.2 07/09/2024 1431   BASOSABS 0.1 07/09/2024 1431      Latest Ref Rng & Units 07/09/2024    2:31 PM 04/18/2024   12:02 PM 03/05/2024    3:37 PM  CMP  Glucose 70 - 99 mg/dL 898  85  897   BUN 6 - 24 mg/dL 22  18  19    Creatinine 0.57 - 1.00 mg/dL 8.65  8.69  8.86   Sodium 134 - 144 mmol/L 140  143  142   Potassium 3.5 - 5.2 mmol/L 4.8  4.9  4.7   Chloride 96 - 106 mmol/L 108  109  105   CO2 20 - 29 mmol/L 19  20  23    Calcium  8.7 - 10.2 mg/dL 89.7  89.6  89.8   Total Protein 6.0 - 8.5 g/dL 7.3  6.9  6.7   Total Bilirubin 0.0 - 1.2 mg/dL 0.4  0.5  0.6   Alkaline Phos 44 - 121 IU/L 99  91  84   AST 0 - 40 IU/L 36  33  33   ALT 0 - 32 IU/L 50  39  37     DIAGNOSTIC IMAGING:  I have independently reviewed the relevant imaging and discussed with the patient.   WRAP UP:  All questions were answered. The patient knows to call the clinic with any problems, questions or concerns.  Medical decision making: Moderate  Time spent on visit: I spent 25 minutes counseling the patient face to face. The total time spent in the appointment was 30 minutes and more than 50% was on counseling.  Delon FORBES Hope, NP  07/18/24 8:06 AM

## 2024-07-18 NOTE — Assessment & Plan Note (Addendum)
-   Labs from October 2015 showed IgG 566 [phone]), IgM 28 (57-237), IgA 65, IgE 12 - Evaluated by [facility] by Dr. 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 Taken together the immune evaluation is normal. I do not believe Michelle Clements has a functional disorder of her immune system. For the hypo-IgM this may become significant if the IgM was less that 15. For this I suggest she have an annual IgG, IgM and IgA. Currently the IgM level is not likely to be significant. 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 Federico, with first dose on 11/03/2021 -No infections or antibiotics over the past 3 months.  No B symptoms. - She reported increasing weakness, fatigue, nausea, and myalgia/arthralgia after IVIG infusions (Gamunex-C 40 g/400 mL q 4 weeks). - Switched to subcutaneous IgG (Xembify  10g/15 mL SQ weekly) in June 2024, which she is tolerating better, although she does continue to have side effects of fatigue, headache, and diarrhea - Dose decreased to Xembify  10g/15 mL SQ every 2 weeks as of November 2024 to see if better tolerated.    - Most recent labs from 07/09/2024 show very stable immunoglobulins.  CMP is shows mildly elevated creatinine with GFR 47.  CBC shows hemoglobin 16 with hematocrit of 48.9.  MCV 99.  The rest of CBC unremarkable. - Patient receives home infusions via [facility] (contact [place] [facility] at [phone], fax [phone], email [email or web address]) - Will check monthly quantitative immunoglobulin trough (day before Xembify ) along with CBC/D and CMP.   - Therapeutic goal is IgG around  600. -Labs from 07/09/2024 shows stable persistent elevation in her kidney function (1.34), slightly elevated ALT and hemoglobin 16 (15.5).  Quantitative IgG is 724.  - OFFICE visit in 3 months - Educational information regarding common side effects and risks has been provided to patient.  We discussed the importance of hydration due to risk of kidney injury.

## 2024-07-19 ENCOUNTER — Ambulatory Visit (INDEPENDENT_AMBULATORY_CARE_PROVIDER_SITE_OTHER): Payer: Self-pay

## 2024-07-19 VITALS — BP 113/71 | HR 70 | Ht 71.0 in | Wt 228.0 lb

## 2024-07-19 DIAGNOSIS — J4540 Moderate persistent asthma, uncomplicated: Secondary | ICD-10-CM | POA: Diagnosis not present

## 2024-07-19 DIAGNOSIS — M359 Systemic involvement of connective tissue, unspecified: Secondary | ICD-10-CM | POA: Insufficient documentation

## 2024-07-19 DIAGNOSIS — G894 Chronic pain syndrome: Secondary | ICD-10-CM | POA: Diagnosis not present

## 2024-07-19 MED ORDER — JOURNAVX 50 MG PO TABS: 1.0000 | ORAL_TABLET | Freq: Two times a day (BID) | ORAL | Status: DC

## 2024-07-19 MED ORDER — JOURNAVX 50 MG PO TABS
1.0000 | ORAL_TABLET | Freq: Two times a day (BID) | ORAL | Status: DC
Start: 2024-07-19 — End: 2024-09-19

## 2024-07-19 NOTE — Progress Notes (Unsigned)
 Established Patient Office Visit  Subjective   Patient ID: Michelle Clements, female    DOB: May 15, 1966  Age: 58 y.o. MRN: [id number]  No chief complaint on file.   HPI Discussed the use of AI scribe software for clinical note transcription with the patient, who gave verbal consent to proceed.  History of Present Illness   Michelle Clements is a 58 year old female who presents for follow-up and establishment of care with a new provider.  Mood and obsessive-compulsive symptoms - Recent exacerbation of bipolar disorder and obsessive-compulsive tendencies - Increased ruminating thoughts - Psychiatric medication adjusted within the last month  Gastrointestinal symptoms and evaluation - Ongoing evaluation by gastroenterology team - Recently completed anorectal pressure test; results unknown - Scheduled for colonoscopy on August 26th - Amitiza  recently refilled for gastrointestinal symptoms  Chronic pain syndromes - History of fibromyalgia, arthritis, and myofascial pain - Previously discontinued spinal injections due to lack of efficacy - Pain management physician recommended restarting spinal injections after eight-month hiatus - Currently taking Lyrica  and tizanidine  for pain management - Started on JuraVix 50 mg twice daily  Hematologic abnormalities - Immune disorder with fluctuating hemoglobin levels - Recent consultation at cancer center - Ongoing monitoring with laboratory tests  Respiratory symptoms and medication side effects - History of respiratory issues including bronchitis - Uses albuterol  inhaler, DuoNeb nebulizer, and Trelegy inhaler - Inconsistent use of Trelegy due to oral side effects (white coating on tongue) - Brushes teeth after Trelegy use but has difficulty cleaning the back of her tongue        ROS    Objective:     BP 113/71   Pulse 70   Ht 5' 11 (1.803 m)   Wt 228 lb 0.6 oz (103.4 kg)   LMP 01/04/2018   SpO2 95%   BMI 31.81 kg/m  BP Readings  from Last 3 Encounters:  07/24/24 113/71  07/19/24 113/71  07/18/24 (!) 107/58   Wt Readings from Last 3 Encounters:  07/24/24 228 lb 0.6 oz (103.4 kg)  07/19/24 228 lb 0.6 oz (103.4 kg)  07/18/24 224 lb 13.9 oz (102 kg)     Physical Exam Vitals and nursing note reviewed.  Constitutional:      Appearance: Normal appearance.  HENT:     Head: Normocephalic.  Eyes:     Extraocular Movements: Extraocular movements intact.     Pupils: Pupils are equal, round, and reactive to light.  Cardiovascular:     Rate and Rhythm: Normal rate and regular rhythm.  Pulmonary:     Effort: Pulmonary effort is normal.     Breath sounds: Normal breath sounds.  Musculoskeletal:     Cervical back: Normal range of motion and neck supple.  Neurological:     Mental Status: She is alert and oriented to person, place, and time.  Psychiatric:        Mood and Affect: Mood normal.        Thought Content: Thought content normal.      No results found for any visits on 07/19/24.  Last CBC Lab Results  Component Value Date   WBC 9.4 07/09/2024   HGB 16.0 (H) 07/09/2024   HCT 48.9 (H) 07/09/2024   MCV 99 (H) 07/09/2024   MCH 32.3 07/09/2024   RDW 12.5 07/09/2024   PLT 294 07/09/2024   Last metabolic panel Lab Results  Component Value Date   GLUCOSE 101 (H) 07/09/2024   NA 140 07/09/2024   K 4.8 07/09/2024  CL 108 (H) 07/09/2024   CO2 19 (L) 07/09/2024   BUN 22 07/09/2024   CREATININE 1.34 (H) 07/09/2024   EGFR 47 (L) 07/09/2024   CALCIUM  10.2 07/09/2024   PROT 7.3 07/09/2024   ALBUMIN 5.1 (H) 07/09/2024   LABGLOB 2.2 07/09/2024   AGRATIO 1.5 03/29/2016   BILITOT 0.4 07/09/2024   ALKPHOS 99 07/09/2024   AST 36 07/09/2024   ALT 50 (H) 07/09/2024   ANIONGAP 11 10/02/2023   Last lipids Lab Results  Component Value Date   CHOL 165 12/05/2023   HDL 49 12/05/2023   LDLCALC 88 12/05/2023   TRIG 165 (H) 12/05/2023   CHOLHDL 3.4 12/05/2023   Last hemoglobin A1c Lab Results   Component Value Date   HGBA1C 5.8 (A) 06/14/2024   Last thyroid  functions Lab Results  Component Value Date   TSH 1.500 03/05/2024   THYROIDAB 22 02/03/2023   Last vitamin D  Lab Results  Component Value Date   VD25OH 73.7 10/27/2023   Last vitamin B12 and Folate Lab Results  Component Value Date   VITAMINB12 397 08/29/2019   FOLATE >20.0 08/29/2019      The 10-year ASCVD risk score (Arnett DK, et al., 2019) is: 3%    Assessment & Plan:   Problem List Items Addressed This Visit       Other   Chronic pain syndrome - Primary   Relevant Medications   Suzetrigine  (JOURNAVX ) 50 MG TABS   Assessment and Plan    Bipolar disorder and obsessive-compulsive disorder Recent exacerbation of symptoms with increased ruminating thoughts and compulsive tendencies. Psychiatrist adjusted medication dosage.  Chronic pain due to arthritis, fibromyalgia, and myofascial pain Chronic pain managed by a specialist. Restarted spinal injections and initiated JuraVix for peripheral pain management. - Continue JuraVix 50 mg twice daily.  Immune disorder with elevated hemoglobin, under evaluation Immune disorder with fluctuating hemoglobin levels. Recent consultation at a cancer center. - Ongoing laboratory evaluations to monitor hemoglobin levels.  Gastrointestinal symptoms under evaluation Gastrointestinal symptoms under evaluation by gastroenterology. Recent anorectal pressure test showed abnormal results. - Scheduled for a colonoscopy on July 30, 2024, for further evaluation.  Chronic respiratory disease, stable on inhalers Chronic respiratory disease managed with albuterol  inhaler, DuoNeb nebulizer, and Trelegy inhaler. Reduced Trelegy use due to oral thrush, managed by improved oral hygiene.       Return in about 6 months (around 01/19/2025) for chronic follow-up with PCP.    Leita Longs, FNP

## 2024-07-21 ENCOUNTER — Encounter (HOSPITAL_COMMUNITY): Payer: Self-pay | Admitting: Hematology & Oncology

## 2024-07-22 ENCOUNTER — Other Ambulatory Visit (HOSPITAL_COMMUNITY): Payer: Self-pay

## 2024-07-22 ENCOUNTER — Other Ambulatory Visit: Payer: Self-pay

## 2024-07-22 DIAGNOSIS — G894 Chronic pain syndrome: Secondary | ICD-10-CM | POA: Diagnosis not present

## 2024-07-22 MED ORDER — JOURNAVX 50 MG PO TABS
1.0000 | ORAL_TABLET | Freq: Two times a day (BID) | ORAL | 0 refills | Status: DC
  Filled 2024-07-22 – 2024-07-23 (×2): qty 30, 15d supply, fill #0

## 2024-07-22 NOTE — Patient Instructions (Signed)
 Michelle Clements  07/22/2024     @PREFPERIOPPHARMACY @   Your procedure is scheduled on 07/30/2024.   Report to [facility] at  0730  A.M.   Call this number if you have problems the morning of surgery:  [phone]  If you experience any cold or flu symptoms such as cough, fever, chills, shortness of breath, etc. between now and your scheduled surgery, please notify us  at the above number.   Remember:        Your last dose of farxiga  should be on 07/26/2024.        DO NOT take and medications for diabetes the morning of your procedure.   Follow the diet and prep instructions given to you by the office.   You may drink clear liquids until 0530 am on 07/30/2024.    Clear liquids allowed are:                    Water, Juice (No red color; non-citric and without pulp; diabetics please choose diet or no sugar options), Carbonated beverages (diabetics please choose diet or no sugar options), Clear Tea (No creamer, milk, or cream, including half & half and powdered creamer), Black Coffee Only (No creamer, milk or cream, including half & half and powdered creamer), and Clear Sports drink (No red color; diabetics please choose diet or no sugar options)    Take these medicines the morning of surgery with A SIP OF WATER              bupropion ,eletriptan, fluoxetine , lamotrigine . Omeprazole , pregabalin ,suzetrigine , tizanidine (if needed), zofran  (if needed).    Do not wear jewelry, make-up or nail polish, including gel polish,  artificial nails, or any other type of covering on natural nails (fingers and  toes).  Do not wear lotions, powders, or perfumes, or deodorant.  Do not shave 48 hours prior to surgery.  Men may shave face and neck.  Do not bring valuables to the hospital.  [facility] is not responsible for any belongings or valuables.  Contacts, dentures or bridgework may not be worn into surgery.  Leave your suitcase in the car.  After surgery it may be brought to your  room.  For patients admitted to the hospital, discharge time will be determined by your treatment team.  Patients discharged the day of surgery will not be allowed to drive home and must have someone with them for 24 hours.    Special instructions:      DO NOT smoke tobacco or vape for 24 hours before your procedure.  Please read over the following fact sheets that you were given. Coughing and Deep Breathing, Surgical Site Infection Prevention, Anesthesia Post-op Instructions, and Care and Recovery After Surgery      Colonoscopy, Adult, Care After The following information offers guidance on how to care for yourself after your procedure. Your health care provider may also give you more specific instructions. If you have problems or questions, contact your health care provider. What can I expect after the procedure? After the procedure, it is common to have: A small amount of blood in your stool for 24 hours after the procedure. Some gas. Mild cramping or bloating of your abdomen. Follow these instructions at home: Eating and drinking  Drink enough fluid to keep your urine pale yellow. Follow instructions from your health care provider about eating or drinking restrictions. Resume your normal diet as told by your health care provider. Avoid heavy or  fried foods that are hard to digest. Activity Rest as told by your health care provider. Avoid sitting for a long time without moving. Get up to take short walks every 1-2 hours. This is important to improve blood flow and breathing. Ask for help if you feel weak or unsteady. Return to your normal activities as told by your health care provider. Ask your health care provider what activities are safe for you. Managing cramping and bloating  Try walking around when you have cramps or feel bloated. If directed, apply heat to your abdomen as told by your health care provider. Use the heat source that your health care provider recommends,  such as a moist heat pack or a heating pad. Place a towel between your skin and the heat source. Leave the heat on for 20-30 minutes. Remove the heat if your skin turns bright red. This is especially important if you are unable to feel pain, heat, or cold. You have a greater risk of getting burned. General instructions If you were given a sedative during the procedure, it can affect you for several hours. Do not drive or operate machinery until your health care provider says that it is safe. For the first 24 hours after the procedure: Do not sign important documents. Do not drink alcohol. Do your regular daily activities at a slower pace than normal. Eat soft foods that are easy to digest. Take over-the-counter and prescription medicines only as told by your health care provider. Keep all follow-up visits. This is important. Contact a health care provider if: You have blood in your stool 2-3 days after the procedure. Get help right away if: You have more than a small spotting of blood in your stool. You have large blood clots in your stool. You have swelling of your abdomen. You have nausea or vomiting. You have a fever. You have increasing pain in your abdomen that is not relieved with medicine. These symptoms may be an emergency. Get help right away. Call 911. Do not wait to see if the symptoms will go away. Do not drive yourself to the hospital. Summary After the procedure, it is common to have a small amount of blood in your stool. You may also have mild cramping and bloating of your abdomen. If you were given a sedative during the procedure, it can affect you for several hours. Do not drive or operate machinery until your health care provider says that it is safe. Get help right away if you have a lot of blood in your stool, nausea or vomiting, a fever, or increased pain in your abdomen. This information is not intended to replace advice given to you by your health care provider.  Make sure you discuss any questions you have with your health care provider. Document Revised: 01/03/2023 Document Reviewed: 07/14/2021 Elsevier Patient Education  2024 [employer]General Anesthesia, Adult, Care After The following information offers guidance on how to care for yourself after your procedure. Your health care provider may also give you more specific instructions. If you have problems or questions, contact your health care provider. What can I expect after the procedure? After the procedure, it is common for people to: Have pain or discomfort at the IV site. Have nausea or vomiting. Have a sore throat or hoarseness. Have trouble concentrating. Feel cold or chills. Feel weak, sleepy, or tired (fatigue). Have soreness and body aches. These can affect parts of the body that were not involved in surgery. Follow these instructions at home:  For the time period you were told by your health care provider:  Rest. Do not participate in activities where you could fall or become injured. Do not drive or use machinery. Do not drink alcohol. Do not take sleeping pills or medicines that cause drowsiness. Do not make important decisions or sign legal documents. Do not take care of children on your own. General instructions Drink enough fluid to keep your urine pale yellow. If you have sleep apnea, surgery and certain medicines can increase your risk for breathing problems. Follow instructions from your health care provider about wearing your sleep device: Anytime you are sleeping, including during daytime naps. While taking prescription pain medicines, sleeping medicines, or medicines that make you drowsy. Return to your normal activities as told by your health care provider. Ask your health care provider what activities are safe for you. Take over-the-counter and prescription medicines only as told by your health care provider. Do not use any products that contain nicotine or  tobacco. These products include cigarettes, chewing tobacco, and vaping devices, such as e-cigarettes. These can delay incision healing after surgery. If you need help quitting, ask your health care provider. Contact a health care provider if: You have nausea or vomiting that does not get better with medicine. You vomit every time you eat or drink. You have pain that does not get better with medicine. You cannot urinate or have bloody urine. You develop a skin rash. You have a fever. Get help right away if: You have trouble breathing. You have chest pain. You vomit blood. These symptoms may be an emergency. Get help right away. Call 911. Do not wait to see if the symptoms will go away. Do not drive yourself to the hospital. Summary After the procedure, it is common to have a sore throat, hoarseness, nausea, vomiting, or to feel weak, sleepy, or fatigue. For the time period you were told by your health care provider, do not drive or use machinery. Get help right away if you have difficulty breathing, have chest pain, or vomit blood. These symptoms may be an emergency. This information is not intended to replace advice given to you by your health care provider. Make sure you discuss any questions you have with your health care provider. Document Revised: 02/18/2022 Document Reviewed: 02/18/2022 Elsevier Patient Education  2024 [employer].

## 2024-07-23 ENCOUNTER — Ambulatory Visit: Admitting: Gastroenterology

## 2024-07-23 ENCOUNTER — Other Ambulatory Visit (HOSPITAL_COMMUNITY): Payer: Self-pay

## 2024-07-23 ENCOUNTER — Ambulatory Visit

## 2024-07-23 ENCOUNTER — Other Ambulatory Visit: Payer: Self-pay

## 2024-07-23 DIAGNOSIS — G4734 Idiopathic sleep related nonobstructive alveolar hypoventilation: Secondary | ICD-10-CM | POA: Diagnosis not present

## 2024-07-23 DIAGNOSIS — D801 Nonfamilial hypogammaglobulinemia: Secondary | ICD-10-CM | POA: Diagnosis not present

## 2024-07-23 DIAGNOSIS — G4733 Obstructive sleep apnea (adult) (pediatric): Secondary | ICD-10-CM | POA: Diagnosis not present

## 2024-07-23 DIAGNOSIS — G43719 Chronic migraine without aura, intractable, without status migrainosus: Secondary | ICD-10-CM | POA: Diagnosis not present

## 2024-07-23 MED ORDER — ZONISAMIDE 50 MG PO CAPS
ORAL_CAPSULE | ORAL | 3 refills | Status: AC
  Filled 2024-07-24: qty 60, 30d supply, fill #0
  Filled 2024-08-17: qty 60, 30d supply, fill #1
  Filled 2024-09-16: qty 60, 30d supply, fill #2
  Filled 2024-10-16: qty 60, 30d supply, fill #3

## 2024-07-23 MED ORDER — ZOLMITRIPTAN 5 MG NA SOLN
NASAL | 5 refills | Status: AC
  Filled 2024-07-23: qty 12, 21d supply, fill #0
  Filled 2024-07-25 – 2024-07-27 (×2): qty 12, 30d supply, fill #0
  Filled 2024-08-22: qty 12, 30d supply, fill #1
  Filled 2024-09-21: qty 12, 30d supply, fill #2
  Filled 2024-10-21: qty 12, 30d supply, fill #3
  Filled 2024-11-16: qty 12, 30d supply, fill #4
  Filled 2024-12-16: qty 12, 30d supply, fill #5

## 2024-07-24 ENCOUNTER — Encounter (HOSPITAL_COMMUNITY): Payer: Self-pay

## 2024-07-24 ENCOUNTER — Other Ambulatory Visit: Payer: Self-pay

## 2024-07-24 ENCOUNTER — Other Ambulatory Visit (HOSPITAL_BASED_OUTPATIENT_CLINIC_OR_DEPARTMENT_OTHER): Payer: Self-pay

## 2024-07-24 ENCOUNTER — Encounter (HOSPITAL_COMMUNITY)
Admission: RE | Admit: 2024-07-24 | Discharge: 2024-07-24 | Disposition: A | Discharge status: Home / Self Care | Source: Ambulatory Visit | Attending: Gastroenterology | Admitting: Gastroenterology

## 2024-07-24 ENCOUNTER — Other Ambulatory Visit (HOSPITAL_COMMUNITY): Payer: Self-pay

## 2024-07-24 VITALS — BP 113/71 | HR 70 | Resp 18 | Ht 71.0 in | Wt 228.0 lb

## 2024-07-24 DIAGNOSIS — Z794 Long term (current) use of insulin: Secondary | ICD-10-CM | POA: Insufficient documentation

## 2024-07-24 DIAGNOSIS — E1122 Type 2 diabetes mellitus with diabetic chronic kidney disease: Secondary | ICD-10-CM | POA: Insufficient documentation

## 2024-07-24 DIAGNOSIS — Z01810 Encounter for preprocedural cardiovascular examination: Secondary | ICD-10-CM | POA: Diagnosis not present

## 2024-07-24 DIAGNOSIS — N1831 Chronic kidney disease, stage 3a: Secondary | ICD-10-CM | POA: Diagnosis not present

## 2024-07-25 ENCOUNTER — Other Ambulatory Visit: Payer: Self-pay

## 2024-07-25 ENCOUNTER — Encounter (HOSPITAL_COMMUNITY): Payer: Self-pay | Admitting: Hematology & Oncology

## 2024-07-25 ENCOUNTER — Other Ambulatory Visit (HOSPITAL_COMMUNITY): Payer: Self-pay

## 2024-07-26 ENCOUNTER — Other Ambulatory Visit (HOSPITAL_COMMUNITY): Payer: Self-pay

## 2024-07-26 NOTE — Assessment & Plan Note (Signed)
 Currently using albuterol as a rescue inhaler.  Not on maintenance therapy.  She endorses increased use of albuterol recently and also reports a 42-month history of increased cough/sputum production.  Chronic shortness of breath with exertion.  Unchanged.  Pulmonary exam today is unremarkable.  Will start Trelegy as a maintenance inhaler in the setting of asthma.

## 2024-07-27 ENCOUNTER — Other Ambulatory Visit (HOSPITAL_COMMUNITY): Payer: Self-pay

## 2024-07-29 ENCOUNTER — Other Ambulatory Visit (HOSPITAL_COMMUNITY): Payer: Self-pay

## 2024-07-29 ENCOUNTER — Other Ambulatory Visit: Payer: Self-pay

## 2024-07-30 ENCOUNTER — Encounter (HOSPITAL_COMMUNITY)
Admission: RE | Disposition: A | Discharge status: Home / Self Care | Payer: Self-pay | Source: Home / Self Care | Attending: Gastroenterology

## 2024-07-30 ENCOUNTER — Other Ambulatory Visit (HOSPITAL_COMMUNITY): Payer: Self-pay

## 2024-07-30 ENCOUNTER — Other Ambulatory Visit: Payer: Self-pay

## 2024-07-30 ENCOUNTER — Ambulatory Visit (HOSPITAL_BASED_OUTPATIENT_CLINIC_OR_DEPARTMENT_OTHER): Admitting: Certified Registered"

## 2024-07-30 ENCOUNTER — Ambulatory Visit (HOSPITAL_COMMUNITY): Admitting: Certified Registered"

## 2024-07-30 ENCOUNTER — Encounter (INDEPENDENT_AMBULATORY_CARE_PROVIDER_SITE_OTHER): Payer: Self-pay | Admitting: *Deleted

## 2024-07-30 ENCOUNTER — Ambulatory Visit (HOSPITAL_COMMUNITY)
Admission: RE | Admit: 2024-07-30 | Discharge: 2024-07-30 | Disposition: A | Discharge status: Home / Self Care | Attending: Gastroenterology | Admitting: Gastroenterology

## 2024-07-30 ENCOUNTER — Encounter (HOSPITAL_COMMUNITY): Payer: Self-pay | Admitting: Gastroenterology

## 2024-07-30 DIAGNOSIS — G4730 Sleep apnea, unspecified: Secondary | ICD-10-CM | POA: Diagnosis not present

## 2024-07-30 DIAGNOSIS — I129 Hypertensive chronic kidney disease with stage 1 through stage 4 chronic kidney disease, or unspecified chronic kidney disease: Secondary | ICD-10-CM | POA: Diagnosis not present

## 2024-07-30 DIAGNOSIS — F319 Bipolar disorder, unspecified: Secondary | ICD-10-CM | POA: Diagnosis not present

## 2024-07-30 DIAGNOSIS — D124 Benign neoplasm of descending colon: Secondary | ICD-10-CM | POA: Insufficient documentation

## 2024-07-30 DIAGNOSIS — Z79899 Other long term (current) drug therapy: Secondary | ICD-10-CM | POA: Insufficient documentation

## 2024-07-30 DIAGNOSIS — D122 Benign neoplasm of ascending colon: Secondary | ICD-10-CM | POA: Diagnosis not present

## 2024-07-30 DIAGNOSIS — R194 Change in bowel habit: Secondary | ICD-10-CM | POA: Insufficient documentation

## 2024-07-30 DIAGNOSIS — I10 Essential (primary) hypertension: Secondary | ICD-10-CM | POA: Insufficient documentation

## 2024-07-30 DIAGNOSIS — F4310 Post-traumatic stress disorder, unspecified: Secondary | ICD-10-CM | POA: Diagnosis not present

## 2024-07-30 DIAGNOSIS — G4733 Obstructive sleep apnea (adult) (pediatric): Secondary | ICD-10-CM | POA: Diagnosis not present

## 2024-07-30 DIAGNOSIS — E119 Type 2 diabetes mellitus without complications: Secondary | ICD-10-CM | POA: Diagnosis not present

## 2024-07-30 DIAGNOSIS — N3010 Interstitial cystitis (chronic) without hematuria: Secondary | ICD-10-CM | POA: Diagnosis not present

## 2024-07-30 DIAGNOSIS — E1122 Type 2 diabetes mellitus with diabetic chronic kidney disease: Secondary | ICD-10-CM | POA: Insufficient documentation

## 2024-07-30 DIAGNOSIS — D123 Benign neoplasm of transverse colon: Secondary | ICD-10-CM

## 2024-07-30 DIAGNOSIS — K219 Gastro-esophageal reflux disease without esophagitis: Secondary | ICD-10-CM | POA: Insufficient documentation

## 2024-07-30 DIAGNOSIS — K5900 Constipation, unspecified: Secondary | ICD-10-CM

## 2024-07-30 DIAGNOSIS — R519 Headache, unspecified: Secondary | ICD-10-CM | POA: Insufficient documentation

## 2024-07-30 DIAGNOSIS — K581 Irritable bowel syndrome with constipation: Secondary | ICD-10-CM | POA: Diagnosis not present

## 2024-07-30 DIAGNOSIS — Z833 Family history of diabetes mellitus: Secondary | ICD-10-CM | POA: Insufficient documentation

## 2024-07-30 DIAGNOSIS — M797 Fibromyalgia: Secondary | ICD-10-CM | POA: Diagnosis not present

## 2024-07-30 DIAGNOSIS — Z87891 Personal history of nicotine dependence: Secondary | ICD-10-CM | POA: Diagnosis not present

## 2024-07-30 DIAGNOSIS — D839 Common variable immunodeficiency, unspecified: Secondary | ICD-10-CM | POA: Insufficient documentation

## 2024-07-30 DIAGNOSIS — K648 Other hemorrhoids: Secondary | ICD-10-CM | POA: Insufficient documentation

## 2024-07-30 DIAGNOSIS — D649 Anemia, unspecified: Secondary | ICD-10-CM | POA: Insufficient documentation

## 2024-07-30 DIAGNOSIS — N189 Chronic kidney disease, unspecified: Secondary | ICD-10-CM | POA: Diagnosis not present

## 2024-07-30 DIAGNOSIS — Z794 Long term (current) use of insulin: Secondary | ICD-10-CM | POA: Insufficient documentation

## 2024-07-30 DIAGNOSIS — J45909 Unspecified asthma, uncomplicated: Secondary | ICD-10-CM | POA: Insufficient documentation

## 2024-07-30 DIAGNOSIS — Z7984 Long term (current) use of oral hypoglycemic drugs: Secondary | ICD-10-CM | POA: Diagnosis not present

## 2024-07-30 DIAGNOSIS — F419 Anxiety disorder, unspecified: Secondary | ICD-10-CM | POA: Insufficient documentation

## 2024-07-30 DIAGNOSIS — Z7982 Long term (current) use of aspirin: Secondary | ICD-10-CM | POA: Insufficient documentation

## 2024-07-30 DIAGNOSIS — N1831 Chronic kidney disease, stage 3a: Secondary | ICD-10-CM

## 2024-07-30 DIAGNOSIS — K635 Polyp of colon: Secondary | ICD-10-CM | POA: Diagnosis not present

## 2024-07-30 HISTORY — PX: COLONOSCOPY: SHX5424

## 2024-07-30 LAB — GLUCOSE, CAPILLARY: Glucose-Capillary: 92 mg/dL (ref 70–99)

## 2024-07-30 LAB — HM COLONOSCOPY

## 2024-07-30 SURGERY — COLONOSCOPY
Anesthesia: General

## 2024-07-30 MED ORDER — LACTATED RINGERS IV SOLN: INTRAVENOUS | Status: DC | PRN

## 2024-07-30 MED ORDER — LIDOCAINE 2% (20 MG/ML) 5 ML SYRINGE
INTRAMUSCULAR | Status: DC | PRN
  Administered 2024-07-30: 50 mg via INTRAVENOUS

## 2024-07-30 MED ORDER — LACTATED RINGERS IV SOLN: INTRAVENOUS | Status: DC

## 2024-07-30 MED ORDER — PROPOFOL 10 MG/ML IV BOLUS
INTRAVENOUS | Status: DC | PRN
  Administered 2024-07-30 (×2): 50 mg via INTRAVENOUS

## 2024-07-30 MED ORDER — PROPOFOL 500 MG/50ML IV EMUL
INTRAVENOUS | Status: DC | PRN
  Administered 2024-07-30: 150 ug/kg/min via INTRAVENOUS

## 2024-07-30 NOTE — Anesthesia Preprocedure Evaluation (Signed)
 Anesthesia Evaluation  Patient identified by MRN, date of birth, ID band Patient awake    Reviewed: Allergy & Precautions, H&P , NPO status , Patient's Chart, lab work & pertinent test results, reviewed documented beta blocker date and time   Airway Mallampati: II  TM Distance: >3 FB Neck ROM: full    Dental no notable dental hx.    Pulmonary neg pulmonary ROS, asthma , sleep apnea , former smoker   Pulmonary exam normal breath sounds clear to auscultation       Cardiovascular Exercise Tolerance: Good hypertension, negative cardio ROS  Rhythm:regular Rate:Normal     Neuro/Psych  Headaches PSYCHIATRIC DISORDERS Anxiety Depression Bipolar Disorder    Neuromuscular disease negative neurological ROS  negative psych ROS   GI/Hepatic negative GI ROS, Neg liver ROS,GERD  ,,  Endo/Other  negative endocrine ROSdiabetesHypothyroidism    Renal/GU Renal diseasenegative Renal ROS  negative genitourinary   Musculoskeletal   Abdominal   Peds  Hematology negative hematology ROS (+) Blood dyscrasia, anemia   Anesthesia Other Findings   Reproductive/Obstetrics negative OB ROS                             Anesthesia Physical Anesthesia Plan  ASA: 3  Anesthesia Plan: General   Post-op Pain Management:    Induction:   PONV Risk Score and Plan: Propofol infusion  Airway Management Planned:   Additional Equipment:   Intra-op Plan:   Post-operative Plan:   Informed Consent: I have reviewed the patients History and Physical, chart, labs and discussed the procedure including the risks, benefits and alternatives for the proposed anesthesia with the patient or authorized representative who has indicated his/her understanding and acceptance.     Dental Advisory Given  Plan Discussed with: CRNA  Anesthesia Plan Comments:        Anesthesia Quick Evaluation

## 2024-07-30 NOTE — Discharge Instructions (Signed)
 You are being discharged to home.  Resume your previous diet.  We are waiting for your pathology results.  Your physician has recommended a repeat colonoscopy in three years for surveillance.  Continue your present medications.  Start taking one capful of Miralax  every day, uptitrate up to 3 capfuls per day as needed

## 2024-07-30 NOTE — Transfer of Care (Signed)
 Immediate Anesthesia Transfer of Care Note  Patient: Michelle Clements  Procedure(s) Performed: COLONOSCOPY  Patient Location: PACU  Anesthesia Type:MAC  Level of Consciousness: awake and alert   Airway & Oxygen  Therapy: Patient Spontanous Breathing and Patient connected to nasal cannula oxygen   Post-op Assessment: Report given to RN and Post -op Vital signs reviewed and stable  Post vital signs: Reviewed and stable  Last Vitals:  Vitals Value Taken Time  BP 126/60 07/30/24 10:56  Temp 37.2 C 07/30/24 10:56  Pulse 77 07/30/24 10:56  Resp 13 07/30/24 10:56  SpO2 100 % 07/30/24 10:56    Last Pain:  Vitals:   07/30/24 1056  TempSrc: Axillary  PainSc: 0-No pain      Patients Stated Pain Goal: 7 (07/30/24 0859)  Complications: No notable events documented.

## 2024-07-30 NOTE — H&P (Signed)
 Michelle Clements is an 58 y.o. female.   Chief Complaint: Recurrent constipation HPI: Michelle Clements is a 58 y.o. female presenting today with a history of CVID, depression, bipolar disorder, asthma, depression, GERD, IBS-C, PTSD, diabetes, interstitial cystitis, CKD,, fibromyalgia , coming for recurrent constipation.  Has issues with intermittent constipation despite taking Amitiza  twice daily, some lower abdominal pain occasionally. The patient denies having any nausea, vomiting, fever, chills, hematochezia, melena, hematemesis, abdominal distention, diarrhea, jaundice, pruritus or weight loss.   Past Medical History:  Diagnosis Date   Allergic rhinitis    Anemia    Arthritis    Lower back and hips   Asthma    Bipolar disorder (HCC)    Bronchitis 05/2024   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HCC)    Constipation    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HCC)    Depression    Essential hypertension, benign    Fibromyalgia    GERD (gastroesophageal reflux disease)    H/O sleep apnea    Hip pain, left    History of palpitations    Negative Holter monitor   History of pneumonia 12/05/1986   Hyperlipidemia    IBS (irritable bowel syndrome)    Nausea and vomiting 02/16/2024   Neck pain    Obstructive sleep apnea (adult) (pediatric) 04/12/2021   Poor short term memory    PTSD (post-traumatic stress disorder)    S/P Botox injection 11/04/2014   For migraine headaches   Type 2 diabetes mellitus (HCC)    Urgency of urination     Past Surgical History:  Procedure Laterality Date   CARPAL TUNNEL RELEASE Right 06/10/2013   Procedure: CARPAL TUNNEL RELEASE;  Surgeon: Darina MALVA Boehringer, MD;  Location: [facility];  Service: Neurosurgery;  Laterality: Right;  Right Carpal Tunnel Release    CHOLECYSTECTOMY     DENTAL SURGERY  12/05/2014   ESOPHAGOGASTRODUODENOSCOPY (EGD) WITH PROPOFOL  N/A 02/16/2024   Procedure: ESOPHAGOGASTRODUODENOSCOPY (EGD) WITH PROPOFOL ;  Surgeon: Eartha Angelia Sieving, MD;  Location: [facility];  Service: Gastroenterology;  Laterality: N/A;  11:30am;ASA 1   mole exc     x 3   MUSCLE BIOPSY  12/05/2006   Right leg   POLYPECTOMY  02/16/2024   Procedure: POLYPECTOMY;  Surgeon: Eartha Angelia, Sieving, MD;  Location: [facility];  Service: Gastroenterology;;   HARLEY DILATION  02/16/2024   Procedure: EGD, WITH DILATION USING SAVARY-GILLIARD DILATOR OVER GUIDEWIRE;  Surgeon: Eartha Angelia, Sieving, MD;  Location: [facility];  Service: Gastroenterology;;   TRIGGER FINGER RELEASE Right 03/2024   thumb   TUBAL LIGATION      Family History  Problem Relation Age of Onset   Fibromyalgia Mother    Diabetes type II Mother    Depression Mother    Alzheimer's disease Mother    GER disease Mother    Heart disease Mother    Paranoid behavior Mother    Dementia Mother    Heart attack Father 81       MI x5   Stroke Father        CVA x7   Asthma Father    Brain cancer Father    Seizures Father    Anxiety disorder Sister    OCD Sister    Sexual abuse Sister    Physical abuse Sister    Anxiety disorder Sister    Fibromyalgia Sister    Other Sister        Temporomandibular Joint   ADD / ADHD Daughter  ADD / ADHD Daughter    Alcohol abuse Neg Hx    Drug abuse Neg Hx    Schizophrenia Neg Hx    Social History:  reports that she quit smoking about 13 years ago. Her smoking use included cigarettes. She has been exposed to tobacco smoke. She has never used smokeless tobacco. She reports current alcohol use. She reports that she does not use drugs.  Allergies:  Allergies  Allergen Reactions   Dust Mite Extract Shortness Of Breath, Other (See Comments) and Cough    Wheezing     Gramineae Pollens Shortness Of Breath    wheezing   Misc. Sulfonamide Containing Compounds Anaphylaxis   Molds & Smuts Shortness Of Breath    wheezing     Other Anaphylaxis, Other (See Comments) and Shortness Of Breath    :  Angioedema  wheezing  Wheezing  Other reaction(s): Cough  Itching and rash  Other reaction(s): Other (See Comments)   : Angioedema   Statins Anaphylaxis   Sulfa Antibiotics Anaphylaxis and Other (See Comments)    : Angioedema  : Angioedema  : Angioedema     : Angioedema    : Angioedema  : Angioedema   : Angioedema,  : Angioedema     : Angioedema,  : Angioedema   Sulfamethoxazole-Trimethoprim Anaphylaxis and Other (See Comments)   Trichophyton Shortness Of Breath    wheezing     Carbamazepine  Rash, Dermatitis and Other (See Comments)    Other Reaction(s): Other (See Comments)   Gluten Meal Itching and Other (See Comments)    Itching and rash Other reaction(s): Abdominal Pain    Quetiapine Other (See Comments)   Quetiapine Fumarate Other (See Comments)    Medications Prior to Admission  Medication Sig Dispense Refill   albuterol  (VENTOLIN  HFA) 108 (90 Base) MCG/ACT inhaler Inhale 2 puffs into the lungs every 6 (six) hours as needed for wheezing and/or shortness of breath. 6.7 g 5   aspirin EC 81 MG tablet Take 1 tablet every day by oral route.     azelastine  (ASTELIN ) 0.1 % nasal spray Place 1 spray into both nostrils 2 (two) times daily. 30 mL 2   buPROPion  (WELLBUTRIN  XL) 150 MG 24 hr tablet Take 1 tablet (150 mg total) by mouth every morning. 30 tablet 2   cetirizine  (ZYRTEC ) 10 MG tablet Take 10 mg by mouth at bedtime.     clonazePAM  (KLONOPIN ) 0.5 MG tablet Take 1 tablet (0.5 mg total) by mouth at bedtime. 30 tablet 2   Coenzyme Q10 (COQ10 PO) Take by mouth. One daily     Collagen Hydrolysate POWD by Does not apply route.     cycloSPORINE  (RESTASIS ) 0.05 % ophthalmic emulsion Place 1 drop into both eyes 2 (two) times daily. 180 each 99   dapagliflozin  propanediol (FARXIGA ) 10 MG TABS tablet Take 1 tablet (10 mg total) by mouth in the morning. 30 tablet 11   FLUoxetine  (PROZAC ) 20 MG capsule Take 1 capsule (20 mg total) by mouth daily. 30 capsule 2   FLUoxetine   (PROZAC ) 40 MG capsule Take 1 capsule (40 mg total) by mouth daily. 30 capsule 2   fluticasone  (FLONASE ) 50 MCG/ACT nasal spray Place 1 spray into both nostrils daily. 16 g 3   Galcanezumab -gnlm (EMGALITY ) 120 MG/ML SOSY Inject 1 mL into the skin every 30 (thirty) days. 1 mL 11   Immune Globulin , Human,-klhw (XEMBIFY ) 10 GM/50ML SOLN Inject 10 g into the skin every 7 (seven) days. 200 mL 3  insulin  glargine (LANTUS ) 100 UNIT/ML Solostar Pen Inject 40 Units into the skin at bedtime. 36 mL 3   lamoTRIgine  (LAMICTAL ) 25 MG tablet Take 1 tablet (25 mg total) by mouth 2 (two) times daily. 60 tablet 2   Latanoprostene Bunod  (VYZULTA ) 0.024 % SOLN Place 1 drop into both eyes at bedtime as directed 7.5 mL 3   lubiprostone  (AMITIZA ) 24 MCG capsule Take 1 capsule (24 mcg total) by mouth 2 (two) times daily. 60 capsule 3   Melatonin 10 MG TABS      omeprazole  (PRILOSEC) 20 MG capsule Take 1 capsule (20 mg total) by mouth 2 (two) times daily. 60 capsule 2   ondansetron  (ZOFRAN -ODT) 8 MG disintegrating tablet Dissolve 1 tablet (8 mg total) by mouth every 8 (eight) hours as needed for nausea or vomiting. 20 tablet 6   pregabalin  (LYRICA ) 200 MG capsule Take 1 capsule (200 mg total) by mouth 3 (three) times daily. 90 capsule 0   rosuvastatin  (CRESTOR ) 40 MG tablet Take 1 tablet (40 mg total) by mouth at bedtime. 90 tablet 1   Suzetrigine  (JOURNAVX ) 50 MG TABS Take 1 tablet by mouth in the morning and at bedtime.     Suzetrigine  (JOURNAVX ) 50 MG TABS Take 1 tablet by mouth every 12 (twelve) hours. 30 tablet 0   tiZANidine  (ZANAFLEX ) 4 MG tablet Take 1 tablet (4 mg total) by mouth every 12 (twelve) hours as needed. 60 tablet 11   traZODone  (DESYREL ) 50 MG tablet Take 1 tablet (50 mg total) by mouth at bedtime. 30 tablet 2   VITAMIN D  PO Take 5,000 Units by mouth daily.     zinc gluconate 50 MG tablet Take 50 mg by mouth daily.     zonisamide  (ZONEGRAN ) 50 MG capsule Take 1 capsule by mouth at bedtime for 5 days  then increase to 2 capsules at bedtime thereafter. 60 capsule 3   Blood Glucose Monitoring Suppl (ONETOUCH VERIO FLEX SYSTEM) w/Device KIT Use as directed to check blood glucose twice daily, before breakfast and before bed. 1 kit 0   Blood Glucose Monitoring Suppl (ONETOUCH VERIO) w/Device KIT Use to check glucose once daily 1 kit 0   Continuous Glucose Sensor (FREESTYLE LIBRE 3 PLUS SENSOR) MISC Change sensor every 15 days. 6 each 3   eletriptan (RELPAX) 40 MG tablet Take by mouth.     estradiol  (ESTRACE ) 0.1 MG/GM vaginal cream Place 1 Applicatorful vaginally 3 (three) times a week. 42.5 g 1   ipratropium-albuterol  (DUONEB) 0.5-2.5 (3) MG/3ML SOLN Take 3 mLs by nebulization 2 (two) times daily as needed. 90 mL 2   Lancets (ONETOUCH ULTRASOFT) lancets Use to check blood sugar once daily 100 each 3   ONETOUCH VERIO test strip Use to check glucose once daily. 100 each 3   OVER THE COUNTER MEDICATION Magnessium 500 mg at night     sennosides-docusate sodium (SENOKOT-S) 8.6-50 MG tablet Take 2 tablets by mouth at bedtime.     UNABLE TO FIND 1 each by Does not apply route daily. Med Name: CPAP SUPPLIES Full face mask Cushion for mask -Medium ResMed 1 each 11   zolmitriptan  (ZOMIG ) 5 MG nasal solution Administer 5 mg into affected nostril(s) as needed (1 nasal spray at onset of severe headache; may repeat once after 2 hours if headache persists; max of 2 doses in 24 hours or 4/week.). 12 each 5    Results for orders placed or performed during the hospital encounter of 07/30/24 (from the past 48 hours)  Glucose, capillary     Status: None   Collection Time: 07/30/24  9:05 AM  Result Value Ref Range   Glucose-Capillary 92 70 - 99 mg/dL    Comment: Glucose reference range applies only to samples taken after fasting for at least 8 hours.   *Note: Due to a large number of results and/or encounters for the requested time period, some results have not been displayed. A complete set of results can be  found in Results Review.   No results found.  Review of Systems  Blood pressure 139/61, pulse (!) 54, temperature 98.2 F (36.8 C), temperature source Oral, height 5' 11 (1.803 m), weight 103.4 kg, last menstrual period 01/04/2018, SpO2 99%. Physical Exam  GENERAL: The patient is AO x3, in no acute distress. HEENT: Head is normocephalic and atraumatic. EOMI are intact. Mouth is well hydrated and without lesions. NECK: Supple. No masses LUNGS: Clear to auscultation. No presence of rhonchi/wheezing/rales. Adequate chest expansion HEART: RRR, normal s1 and s2. ABDOMEN: Soft, nontender, no guarding, no peritoneal signs, and nondistended. BS +. No masses. EXTREMITIES: Without any cyanosis, clubbing, rash, lesions or edema. NEUROLOGIC: AOx3, no focal motor deficit. SKIN: no jaundice, no rashes  Assessment/Plan CARYLON TAMBURRO is a 58 y.o. female presenting today with a history of CVID, depression, bipolar disorder, asthma, depression, GERD, IBS-C, PTSD, diabetes, interstitial cystitis, CKD,, fibromyalgia , coming for recurrent constipation.  Will proceed with colonoscopy.  Toribio Eartha Flavors, MD 07/30/2024, 9:51 AM

## 2024-07-30 NOTE — Anesthesia Postprocedure Evaluation (Signed)
 Anesthesia Post Note  Patient: Michelle Clements  Procedure(s) Performed: COLONOSCOPY  Patient location during evaluation: Phase II Anesthesia Type: General Level of consciousness: awake Pain management: pain level controlled Vital Signs Assessment: post-procedure vital signs reviewed and stable Respiratory status: spontaneous breathing and respiratory function stable Cardiovascular status: blood pressure returned to baseline and stable Postop Assessment: no headache and no apparent nausea or vomiting Anesthetic complications: no Comments: Late entry   No notable events documented.   Last Vitals:  Vitals:   07/30/24 0859 07/30/24 1056  BP: 139/61 126/60  Pulse: (!) 54 77  Resp:  13  Temp: 36.8 C 37.2 C  SpO2: 99% 100%    Last Pain:  Vitals:   07/30/24 1056  TempSrc: Axillary  PainSc: 0-No pain                 Yvonna JINNY Bosworth

## 2024-07-30 NOTE — Op Note (Signed)
 [facility] Patient Name: Michelle Clements Procedure Date: 07/30/2024 9:54 AM MRN: [id number] Date of Birth: 1966/03/26 Attending MD: Toribio Fortune , , [id number] CSN: [id number] Age: 58 Admit Type: Outpatient Procedure:                Colonoscopy Indications:              Change in bowel habits Providers:                Toribio Fortune, Olam Ada, RN, Daphne Mulch                            Technician, Technician Referring MD:              Medicines:                Monitored Anesthesia Care Complications:            No immediate complications. Estimated blood loss:                            None. Estimated Blood Loss:     Estimated blood loss: none. Procedure:                Pre-Anesthesia Assessment:                           - Prior to the procedure, a History and Physical                            was performed, and patient medications, allergies                            and sensitivities were reviewed. The patient's                            tolerance of previous anesthesia was reviewed.                           - The risks and benefits of the procedure and the                            sedation options and risks were discussed with the                            patient. All questions were answered and informed                            consent was obtained.                           - ASA Grade Assessment: II - A patient with mild                            systemic disease.                           After obtaining informed consent, the colonoscope  was passed under direct vision. Throughout the                            procedure, the patient's blood pressure, pulse, and                            oxygen  saturations were monitored continuously. The                            [id number]-[id number] ([id number]) Peds Colon was introduced                            through the anus and advanced to the the cecum,                            identified by  appendiceal orifice and ileocecal                            valve. The colonoscopy was performed without                            difficulty. The patient tolerated the procedure                            well. The quality of the bowel preparation was                            adequate. Scope In: 10:20:59 AM Scope Out: 10:50:44 AM Scope Withdrawal Time: 0 hours 21 minutes 11 seconds  Total Procedure Duration: 0 hours 29 minutes 45 seconds  Findings:      The perianal and digital rectal examinations were normal.      Six sessile polyps were found in the transverse colon and ascending       colon. The polyps were 2 to 6 mm in size. These polyps were removed with       a cold snare. Resection and retrieval were complete.      A 5 mm polyp was found in the descending colon. The polyp was sessile.       The polyp was removed with a cold snare. Resection and retrieval were       complete.      Non-bleeding internal hemorrhoids were found during retroflexion. The       hemorrhoids were small. Impression:               - Six 2 to 6 mm polyps in the transverse colon and                            in the ascending colon, removed with a cold snare.                            Resected and retrieved.                           - One 5 mm polyp in the descending colon, removed  with a cold snare. Resected and retrieved.                           - Non-bleeding internal hemorrhoids. Moderate Sedation:      Per Anesthesia Care Recommendation:           - Discharge patient to home (ambulatory).                           - Resume previous diet.                           - Await pathology results.                           - Repeat colonoscopy in 3 years for surveillance.                           - Continue present medications.                           - Start taking one capful of Miralax  every day,                            uptitrate up to 3 capfuls per day as  needed Procedure Code(s):        --- Professional ---                           [phone], Colonoscopy, flexible; with removal of                            tumor(s), polyp(s), or other lesion(s) by snare                            technique Diagnosis Code(s):        --- Professional ---                           D12.3, Benign neoplasm of transverse colon (hepatic                            flexure or splenic flexure)                           D12.2, Benign neoplasm of ascending colon                           D12.4, Benign neoplasm of descending colon                           K64.8, Other hemorrhoids                           R19.4, Change in bowel habit CPT copyright 2022 [employer]. All rights reserved. The codes documented in this report are preliminary and upon coder review may  be revised to meet current compliance requirements. Toribio Fortune, MD Toribio  Eartha,  07/30/2024 10:54:40 AM This report has been signed electronically. Number of Addenda: 0

## 2024-07-31 ENCOUNTER — Encounter (INDEPENDENT_AMBULATORY_CARE_PROVIDER_SITE_OTHER): Payer: Self-pay | Admitting: Gastroenterology

## 2024-07-31 ENCOUNTER — Ambulatory Visit: Payer: Self-pay | Admitting: Gastroenterology

## 2024-07-31 LAB — SURGICAL PATHOLOGY

## 2024-08-01 ENCOUNTER — Encounter (HOSPITAL_COMMUNITY): Payer: Self-pay | Admitting: Gastroenterology

## 2024-08-02 ENCOUNTER — Other Ambulatory Visit (HOSPITAL_COMMUNITY): Payer: Self-pay

## 2024-08-02 ENCOUNTER — Emergency Department (HOSPITAL_COMMUNITY)

## 2024-08-02 ENCOUNTER — Encounter (HOSPITAL_COMMUNITY): Payer: Self-pay | Admitting: Emergency Medicine

## 2024-08-02 ENCOUNTER — Ambulatory Visit: Payer: Self-pay

## 2024-08-02 ENCOUNTER — Emergency Department (HOSPITAL_COMMUNITY)
Admission: EM | Admit: 2024-08-02 | Discharge: 2024-08-02 | Disposition: A | Discharge status: Home / Self Care | Source: Ambulatory Visit | Attending: Emergency Medicine | Admitting: Emergency Medicine

## 2024-08-02 ENCOUNTER — Other Ambulatory Visit: Payer: Self-pay

## 2024-08-02 DIAGNOSIS — Z79899 Other long term (current) drug therapy: Secondary | ICD-10-CM | POA: Diagnosis not present

## 2024-08-02 DIAGNOSIS — Z7982 Long term (current) use of aspirin: Secondary | ICD-10-CM | POA: Diagnosis not present

## 2024-08-02 DIAGNOSIS — R079 Chest pain, unspecified: Secondary | ICD-10-CM

## 2024-08-02 DIAGNOSIS — R0789 Other chest pain: Secondary | ICD-10-CM | POA: Insufficient documentation

## 2024-08-02 DIAGNOSIS — I10 Essential (primary) hypertension: Secondary | ICD-10-CM | POA: Insufficient documentation

## 2024-08-02 LAB — D-DIMER, QUANTITATIVE: D-Dimer, Quant: <0.27 ug{FEU}/mL (ref 0.00–0.50)

## 2024-08-02 LAB — CBC
HCT: 45 % (ref 36.0–46.0)
Hemoglobin: 15 g/dL (ref 12.0–15.0)
MCH: 31.8 pg (ref 26.0–34.0)
MCHC: 33.3 g/dL (ref 30.0–36.0)
MCV: 95.3 fL (ref 80.0–100.0)
Platelets: 271 K/uL (ref 150–400)
RBC: 4.72 MIL/uL (ref 3.87–5.11)
RDW: 12.3 % (ref 11.5–15.5)
WBC: 7.4 K/uL (ref 4.0–10.5)
nRBC: 0 % (ref 0.0–0.2)

## 2024-08-02 LAB — CBG MONITORING, ED: Glucose-Capillary: 96 mg/dL (ref 70–99)

## 2024-08-02 LAB — BASIC METABOLIC PANEL WITH GFR
Anion gap: 12 (ref 5–15)
BUN: 14 mg/dL (ref 6–20)
CO2: 23 mmol/L (ref 22–32)
Calcium: 9.6 mg/dL (ref 8.9–10.3)
Chloride: 104 mmol/L (ref 98–111)
Creatinine, Ser: 1.12 mg/dL — ABNORMAL HIGH (ref 0.44–1.00)
GFR, Estimated: 58 mL/min — ABNORMAL LOW (ref >60)
Glucose, Bld: 96 mg/dL (ref 70–99)
Potassium: 4.2 mmol/L (ref 3.5–5.1)
Sodium: 139 mmol/L (ref 135–145)

## 2024-08-02 LAB — TROPONIN I (HIGH SENSITIVITY): Troponin I (High Sensitivity): <2 ng/L (ref <18)

## 2024-08-02 MED ORDER — TRAMADOL HCL 50 MG PO TABS: 50.0000 mg | ORAL_TABLET | Freq: Four times a day (QID) | ORAL | 0 refills | Status: AC | PRN

## 2024-08-02 NOTE — Telephone Encounter (Signed)
 FYI Only or Action Required?: FYI only for provider.  Patient was last seen in primary care on 07/19/2024 by Bevely Doffing, FNP.  Called Nurse Triage reporting No chief complaint on file..  Symptoms began a week ago.  Interventions attempted: Rest, hydration, or home remedies and Ice/heat application.  Symptoms are: gradually worsening.  Triage Disposition: Go to ED Now (Notify PCP)  Patient/caregiver understands and will follow disposition?: Unsure  Copied from CRM [id number]. Topic: Clinical - Red Word Triage >> Aug 02, 2024  8:01 AM Ahlexyia S wrote: Red Word that prompted transfer to Nurse Triage: Pt called in wanting to see DO Gottschalk. Pt is stating that she has a questionable torn muscle in her chest. Pt is in severe pain and it hurts when taking a breath. When asked her pain scale pt stated a 10. Reason for Disposition  Pain also in shoulder(s) or arm(s) or jaw  (Exception: Pain is clearly made worse by movement.)  Answer Assessment - Initial Assessment Questions 1. LOCATION: Where does it hurt?       Middle of the chest  2. RADIATION: Does the pain go anywhere else? (e.g., into neck, jaw, arms, back)     Right Shoulder  3. ONSET: When did the chest pain begin? (Minutes, hours or days)      One week  4. PATTERN: Does the pain come and go, or has it been constant since it started?  Does it get worse with exertion?      Constant  5. DURATION: How long does it last (e.g., seconds, minutes, hours)     Unsure  6. SEVERITY: How bad is the pain?  (e.g., Scale 1-10; mild, moderate, or severe)     8-9  7. CARDIAC RISK FACTORS: Do you have any history of heart problems or risk factors for heart disease? (e.g., angina, prior heart attack; diabetes, high blood pressure, high cholesterol, smoker, or strong family history of heart disease)     Hypertension  8. PULMONARY RISK FACTORS: Do you have any history of lung disease?  (e.g., blood clots in lung, asthma,  emphysema, birth control pills)     Asthma  9. CAUSE: What do you think is causing the chest pain?     Unsure  10. OTHER SYMPTOMS: Do you have any other symptoms? (e.g., dizziness, nausea, vomiting, sweating, fever, difficulty breathing, cough)       Pain with Inspiration, Deep Inspiration is Worse,   11. PREGNANCY: Is there any chance you are pregnant? When was your last menstrual period?       No and No  More painful with movement, or movement exacerbates pain.  Protocols used: Chest Pain-A-AH

## 2024-08-02 NOTE — ED Triage Notes (Signed)
 Pt c/o of chest pain x 4 days. Pcp told pt to come be checked out.

## 2024-08-02 NOTE — Discharge Instructions (Addendum)
 You are seen in the emergency department for chest pain.  You had blood work EKG and chest x-ray that did not show any evidence of cardiac injury.  We are prescribing you a short course of tramadol  to see if it helps.  Please follow-up with your primary care doctor for further evaluation.  Return if any worsening or concerning symptoms

## 2024-08-02 NOTE — ED Provider Notes (Signed)
 [place] EMERGENCY DEPARTMENT AT [facility] Provider Note   CSN: [id number] Arrival date & time: 08/02/24  9061     Patient presents with: Chest Pain   Michelle Clements is a 58 y.o. female.  She has a history of hypertension, fibromyalgia.  She has been having about 4 days of some central and right-sided chest pain.  Worse with deep breathing but also worse with palpation.  She cannot recall any trauma.  Does not feel short of breath.  No history of coronary disease.  She called her primary care doctor today who recommended she come to the emergency department for further evaluation.   The history is provided by the patient.  Chest Pain Pain location:  Substernal area and R chest Pain quality: aching   Pain radiates to:  Does not radiate Pain severity:  Moderate Onset quality:  Gradual Duration:  4 days Timing:  Intermittent Progression:  Unchanged Chronicity:  New Relieved by:  Nothing Worsened by:  Deep breathing and movement Ineffective treatments: tylenol . Associated symptoms: no cough, no fever, no heartburn, no nausea, no shortness of breath and no vomiting        Prior to Admission medications   Medication Sig Start Date End Date Taking? Authorizing Provider  albuterol  (VENTOLIN  HFA) 108 (90 Base) MCG/ACT inhaler Inhale 2 puffs into the lungs every 6 (six) hours as needed for wheezing and/or shortness of breath. 07/10/23     aspirin EC 81 MG tablet Take 1 tablet every day by oral route.    [provider]  azelastine  (ASTELIN ) 0.1 % nasal spray Place 1 spray into both nostrils 2 (two) times daily. 07/27/23     Blood Glucose Monitoring Suppl (ONETOUCH VERIO FLEX SYSTEM) w/Device KIT Use as directed to check blood glucose twice daily, before breakfast and before bed. 03/08/22   Therisa Benton PARAS, NP  Blood Glucose Monitoring Suppl [facility] VERIO) w/Device KIT Use to check glucose once daily 02/01/24   Therisa Benton PARAS, NP  buPROPion  (WELLBUTRIN  XL) 150 MG  24 hr tablet Take 1 tablet (150 mg total) by mouth every morning. 07/02/24   Okey Barnie SAUNDERS, MD  cetirizine  (ZYRTEC ) 10 MG tablet Take 10 mg by mouth at bedtime. 07/19/21   [provider]  clonazePAM  (KLONOPIN ) 0.5 MG tablet Take 1 tablet (0.5 mg total) by mouth at bedtime. 07/02/24   Okey Barnie SAUNDERS, MD  Coenzyme Q10 (COQ10 PO) Take by mouth. One daily    [provider]  Collagen Hydrolysate POWD by Does not apply route.    [provider]  Continuous Glucose Sensor (FREESTYLE LIBRE 3 PLUS SENSOR) MISC Change sensor every 15 days. 03/08/24   Therisa Benton PARAS, NP  cycloSPORINE  (RESTASIS ) 0.05 % ophthalmic emulsion Place 1 drop into both eyes 2 (two) times daily. 08/14/23     dapagliflozin  propanediol (FARXIGA ) 10 MG TABS tablet Take 1 tablet (10 mg total) by mouth in the morning. 05/14/24     eletriptan (RELPAX) 40 MG tablet Take by mouth. 06/24/24   [provider]  estradiol  (ESTRACE ) 0.1 MG/GM vaginal cream Place 1 Applicatorful vaginally 3 (three) times a week. 12/06/23   Melvenia Manus BRAVO, MD  FLUoxetine  (PROZAC ) 20 MG capsule Take 1 capsule (20 mg total) by mouth daily. 07/02/24 07/02/25  Okey Barnie SAUNDERS, MD  FLUoxetine  (PROZAC ) 40 MG capsule Take 1 capsule (40 mg total) by mouth daily. 07/02/24   Okey Barnie SAUNDERS, MD  fluticasone  (FLONASE ) 50 MCG/ACT nasal spray Place 1  spray into both nostrils daily. 04/10/24   Melvenia Manus BRAVO, MD  Galcanezumab -gnlm (EMGALITY ) 120 MG/ML SOSY Inject 1 mL into the skin every 30 (thirty) days. 07/24/23     Immune Globulin , Human,-klhw (XEMBIFY ) 10 GM/50ML SOLN Inject 10 g into the skin every 7 (seven) days. 04/12/23   Lamon Pleasant HERO, PA-C  insulin  glargine (LANTUS ) 100 UNIT/ML Solostar Pen Inject 40 Units into the skin at bedtime. 03/08/24   Therisa Benton PARAS, NP  ipratropium-albuterol  (DUONEB) 0.5-2.5 (3) MG/3ML SOLN Take 3 mLs by nebulization 2 (two) times daily as needed. 07/27/23     lamoTRIgine  (LAMICTAL ) 25 MG tablet Take 1  tablet (25 mg total) by mouth 2 (two) times daily. 07/02/24   Okey Barnie SAUNDERS, MD  Lancets [facility] ULTRASOFT) lancets Use to check blood sugar once daily 02/01/24   Therisa Benton PARAS, NP  Latanoprostene Bunod  (VYZULTA ) 0.024 % SOLN Place 1 drop into both eyes at bedtime as directed 07/10/23     lubiprostone  (AMITIZA ) 24 MCG capsule Take 1 capsule (24 mcg total) by mouth 2 (two) times daily. 06/26/24   Bevely Doffing, FNP  Melatonin 10 MG TABS     [provider]  omeprazole  (PRILOSEC) 20 MG capsule Take 1 capsule (20 mg total) by mouth 2 (two) times daily. 04/24/24   Melvenia Manus BRAVO, MD  ondansetron  (ZOFRAN -ODT) 8 MG disintegrating tablet Dissolve 1 tablet (8 mg total) by mouth every 8 (eight) hours as needed for nausea or vomiting. 02/14/24     ONETOUCH VERIO test strip Use to check glucose once daily. 02/01/24   Therisa Benton PARAS, NP  OVER THE COUNTER MEDICATION Magnessium 500 mg at night    [provider]  pregabalin  (LYRICA ) 200 MG capsule Take 1 capsule (200 mg total) by mouth 3 (three) times daily. 06/20/24     rosuvastatin  (CRESTOR ) 40 MG tablet Take 1 tablet (40 mg total) by mouth at bedtime. 05/02/24   Melvenia Manus BRAVO, MD  sennosides-docusate sodium (SENOKOT-S) 8.6-50 MG tablet Take 2 tablets by mouth at bedtime.    [provider]  Suzetrigine  (JOURNAVX ) 50 MG TABS Take 1 tablet by mouth in the morning and at bedtime. 07/19/24   Bevely Doffing, FNP  Suzetrigine  (JOURNAVX ) 50 MG TABS Take 1 tablet by mouth every 12 (twelve) hours. 07/22/24     tiZANidine  (ZANAFLEX ) 4 MG tablet Take 1 tablet (4 mg total) by mouth every 12 (twelve) hours as needed. 02/15/24     traZODone  (DESYREL ) 50 MG tablet Take 1 tablet (50 mg total) by mouth at bedtime. 07/02/24   Okey Barnie SAUNDERS, MD  UNABLE TO FIND 1 each by Does not apply route daily. Med Name: CPAP SUPPLIES Full face mask Cushion for mask -Medium ResMed 01/18/24   Melvenia Manus BRAVO, MD  VITAMIN D  PO Take 5,000 Units by mouth daily.     [provider]  zinc gluconate 50 MG tablet Take 50 mg by mouth daily.    [provider]  zolmitriptan  (ZOMIG ) 5 MG nasal solution Administer 5 mg into affected nostril(s) as needed (1 nasal spray at onset of severe headache; may repeat once after 2 hours if headache persists; max of 2 doses in 24 hours or 4/week.). 07/23/24     zonisamide  (ZONEGRAN ) 50 MG capsule Take 1 capsule by mouth at bedtime for 5 days then increase to 2 capsules at bedtime thereafter. 07/23/24     acetaZOLAMIDE  (DIAMOX ) 250 MG tablet Take 1 tablet (250 mg total) by mouth daily  for 14 days, THEN 1 tablet (250 mg total) 2 (two) times daily for 14 days, THEN 1 tablet (250 mg) in the morning and 2 tablets (500 mg) in the evening for 14 days, THEN 2 tablets (500 mg total) 2 (two) times daily. 02/23/24 07/24/24    rOPINIRole  (REQUIP ) 0.25 MG tablet Take 1 tablet (0.25 mg total) by mouth 2 (two) times daily. 07/27/23 08/11/23    SUMAtriptan  (IMITREX ) 100 MG tablet Take 1 tablet by mouth as needed for migraine. If migraine continues, may repeat dose after 2 hours. 07/27/23 08/11/23      Allergies: Dust mite extract, Gramineae pollens, Misc. sulfonamide containing compounds, Molds & smuts, Other, Statins, Sulfa antibiotics, Sulfamethoxazole-trimethoprim, Trichophyton, Carbamazepine , Gluten meal, Quetiapine, and Quetiapine fumarate    Review of Systems  Constitutional:  Negative for fever.  Respiratory:  Negative for cough and shortness of breath.   Cardiovascular:  Positive for chest pain.  Gastrointestinal:  Negative for heartburn, nausea and vomiting.    Updated Vital Signs BP 121/75   Pulse 75   Temp 99.1 F (37.3 C) (Oral)   Resp 15   Ht 5' 11 (1.803 m)   Wt 101.2 kg   LMP 01/04/2018   SpO2 95%   BMI 31.10 kg/m   Physical Exam Vitals and nursing note reviewed.  Constitutional:      General: She is not in acute distress.    Appearance: Normal appearance. She is well-developed.  HENT:     Head:  Normocephalic and atraumatic.  Eyes:     Conjunctiva/sclera: Conjunctivae normal.  Cardiovascular:     Rate and Rhythm: Normal rate and regular rhythm.     Heart sounds: Normal heart sounds. No murmur heard. Pulmonary:     Effort: Pulmonary effort is normal. No respiratory distress.     Breath sounds: Normal breath sounds. No stridor. No wheezing.  Chest:     Chest wall: Tenderness (She has reproducible tenderness palpating her costochondral junction on the right) present.  Abdominal:     Palpations: Abdomen is soft.     Tenderness: There is no abdominal tenderness. There is no guarding or rebound.  Musculoskeletal:        General: No tenderness or deformity.     Cervical back: Neck supple.     Right lower leg: No tenderness. No edema.     Left lower leg: No tenderness. No edema.  Skin:    General: Skin is warm and dry.  Neurological:     General: No focal deficit present.     Mental Status: She is alert.     GCS: GCS eye subscore is 4. GCS verbal subscore is 5. GCS motor subscore is 6.     (all labs ordered are listed, but only abnormal results are displayed) Labs Reviewed  BASIC METABOLIC PANEL WITH GFR - Abnormal; Notable for the following components:      Result Value   Creatinine, Ser 1.12 (*)    GFR, Estimated 58 (*)    All other components within normal limits  CBC  D-DIMER, QUANTITATIVE  CBG MONITORING, ED  TROPONIN I (HIGH SENSITIVITY)    EKG: EKG Interpretation Date/Time:  Friday August 02 2024 12:20:23 EDT Ventricular Rate:  55 PR Interval:  175 QRS Duration:  95 QT Interval:  477 QTC Calculation: 457 R Axis:   77  Text Interpretation: Sinus rhythm No significant change since prior 7/14 Confirmed by Towana Sharper [phone]) on 08/02/2024 12:30:27 PM  Radiology: DG Chest 2 View Result  Date: 08/02/2024 CLINICAL DATA:  Chest pain EXAM: CHEST - 2 VIEW COMPARISON:  Apr 22, 2021 FINDINGS: The heart size and mediastinal contours are within normal limits. Both  lungs are clear. The visualized skeletal structures are unremarkable. IMPRESSION: No active cardiopulmonary disease. Electronically Signed   By: Michaeline Blanch M.D.   On: 08/02/2024 10:39     Procedures   Medications Ordered in the ED - No data to display  Clinical Course as of 08/02/24 1758  Fri Aug 02, 2024  1207 EKG not crossing in epic.  Sinus rhythm without any acute ST-T changes. [MB]  1308 Reviewed results of workup with patient.  She agrees she does not think this is cardiac and would just like some pain medicine.  Tramadol  has worked in the past. [MB]    Clinical Course User Index [MB] Towana Ozell BROCKS, MD                                 Medical Decision Making Amount and/or Complexity of Data Reviewed Labs: ordered. Radiology: ordered.  Risk Prescription drug management.   This patient complains of anterior chest pain; this involves an extensive number of treatment Options and is a complaint that carries with it a high risk of complications and morbidity. The differential includes ACS, pneumonia, PE, pneumothorax, musculoskeletal  I ordered, reviewed and interpreted labs, which included CBC normal, chemistries unremarkable, troponins flat, D-dimer negative I ordered imaging studies which included chest x-ray and I independently    visualized and interpreted imaging which showed no acute findings Previous records obtained and reviewed in epic including recent GI and PCP notes Cardiac monitoring reviewed, sinus bradycardia Social determinants considered, stress Critical Interventions: None  After the interventions stated above, I reevaluated the patient and found patient still to have pain although hemodynamically stable and well-appearing Admission and further testing considered, no indications for admission.  Will treat with tramadol  at her request and recommended close outpatient follow-up.  Return instructions discussed      Final diagnoses:  Nonspecific chest  pain    ED Discharge Orders          Ordered    traMADol  (ULTRAM ) 50 MG tablet  Every 6 hours PRN        08/02/24 1311               Towana Ozell BROCKS, MD 08/02/24 1801

## 2024-08-06 ENCOUNTER — Telehealth (INDEPENDENT_AMBULATORY_CARE_PROVIDER_SITE_OTHER): Admitting: Psychiatry

## 2024-08-06 ENCOUNTER — Encounter (HOSPITAL_COMMUNITY): Payer: Self-pay | Admitting: Psychiatry

## 2024-08-06 ENCOUNTER — Other Ambulatory Visit: Payer: Self-pay

## 2024-08-06 ENCOUNTER — Other Ambulatory Visit (HOSPITAL_COMMUNITY): Payer: Self-pay

## 2024-08-06 DIAGNOSIS — F319 Bipolar disorder, unspecified: Secondary | ICD-10-CM

## 2024-08-06 DIAGNOSIS — H532 Diplopia: Secondary | ICD-10-CM | POA: Diagnosis not present

## 2024-08-06 DIAGNOSIS — F332 Major depressive disorder, recurrent severe without psychotic features: Secondary | ICD-10-CM

## 2024-08-06 DIAGNOSIS — M797 Fibromyalgia: Secondary | ICD-10-CM | POA: Diagnosis not present

## 2024-08-06 DIAGNOSIS — H25011 Cortical age-related cataract, right eye: Secondary | ICD-10-CM | POA: Diagnosis not present

## 2024-08-06 DIAGNOSIS — D801 Nonfamilial hypogammaglobulinemia: Secondary | ICD-10-CM | POA: Diagnosis not present

## 2024-08-06 DIAGNOSIS — E1165 Type 2 diabetes mellitus with hyperglycemia: Secondary | ICD-10-CM | POA: Diagnosis not present

## 2024-08-06 MED ORDER — PREGABALIN 200 MG PO CAPS
200.0000 mg | ORAL_CAPSULE | Freq: Three times a day (TID) | ORAL | 5 refills | Status: AC
  Filled 2024-08-06: qty 90, 30d supply, fill #0
  Filled 2024-09-11: qty 90, 30d supply, fill #1
  Filled 2024-11-07: qty 90, 30d supply, fill #2
  Filled 2024-11-26 – 2024-12-06 (×2): qty 90, 30d supply, fill #3

## 2024-08-06 MED ORDER — BUPROPION HCL ER (XL) 150 MG PO TB24
150.0000 mg | ORAL_TABLET | Freq: Every morning | ORAL | 2 refills | Status: DC
  Filled 2024-08-06 – 2024-08-13 (×2): qty 30, 30d supply, fill #0
  Filled 2024-09-10: qty 30, 30d supply, fill #1
  Filled 2024-10-14: qty 30, 30d supply, fill #2

## 2024-08-06 MED ORDER — LAMOTRIGINE 25 MG PO TABS
25.0000 mg | ORAL_TABLET | Freq: Two times a day (BID) | ORAL | 2 refills | Status: DC
  Filled 2024-08-06 – 2024-08-13 (×2): qty 60, 30d supply, fill #0
  Filled 2024-09-10: qty 60, 30d supply, fill #1
  Filled ????-??-??: fill #2

## 2024-08-06 MED ORDER — FLUOXETINE HCL 40 MG PO CAPS
40.0000 mg | ORAL_CAPSULE | Freq: Every day | ORAL | 2 refills | Status: DC
  Filled 2024-08-06 – 2024-08-13 (×2): qty 30, 30d supply, fill #0
  Filled 2024-09-10: qty 30, 30d supply, fill #1
  Filled ????-??-??: fill #2

## 2024-08-06 MED ORDER — CLONAZEPAM 0.5 MG PO TABS
0.5000 mg | ORAL_TABLET | Freq: Every day | ORAL | 2 refills | Status: DC
  Filled 2024-08-06 – 2024-08-25 (×2): qty 30, 30d supply, fill #0
  Filled 2024-10-04: qty 30, 30d supply, fill #1

## 2024-08-06 MED ORDER — TRAZODONE HCL 50 MG PO TABS
50.0000 mg | ORAL_TABLET | Freq: Every day | ORAL | 2 refills | Status: DC
  Filled 2024-08-06 – 2024-08-13 (×2): qty 30, 30d supply, fill #0
  Filled 2024-09-10: qty 30, 30d supply, fill #1
  Filled ????-??-??: fill #2

## 2024-08-06 MED ORDER — FLUOXETINE HCL 20 MG PO CAPS
20.0000 mg | ORAL_CAPSULE | Freq: Every day | ORAL | 2 refills | Status: DC
  Filled 2024-08-06 – 2024-08-13 (×2): qty 30, 30d supply, fill #0
  Filled 2024-09-10: qty 30, 30d supply, fill #1
  Filled ????-??-??: fill #2

## 2024-08-06 NOTE — Progress Notes (Signed)
 Virtual Visit via Video Note  I connected with Michelle Clements on 08/06/24 at  9:20 AM EDT by a video enabled telemedicine application and verified that I am speaking with the correct person using two identifiers.  Location: Patient: home Provider: office   I discussed the limitations of evaluation and management by telemedicine and the availability of in person appointments. The patient expressed understanding and agreed to proceed.     I discussed the assessment and treatment plan with the patient. The patient was provided an opportunity to ask questions and all were answered. The patient agreed with the plan and demonstrated an understanding of the instructions.   The patient was advised to call back or seek an in-person evaluation if the symptoms worsen or if the condition fails to improve as anticipated.  I provided 20 minutes of non-face-to-face time during this encounter.   Michelle Gull, MD  [facility] MD/PA/NP OP Progress Note  08/06/2024 9:38 AM Michelle Clements  MRN:  [id number]  Chief Complaint:  Chief Complaint  Patient presents with   Depression   Anxiety   Follow-up   HPI:  This patient is a 58 year old married white female who lives with her husband in [place]. She has 2 grown daughters. 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   The patient returns for follow-up after 4 weeks regarding her depression and anxiety manic symptoms possible bipolar disorder.  Last time she told me she was still depressed and worrying all the time obsessing about her husband possibly having an affair although there was no evidence of this.  She was also overwhelmed by taking care of her 15-year-old grandson.  Last time we did increase her Prozac  from 40 to 60 mg.  She states over the last few days she has felt much better.  Her mood has improved.  Her energy is better.  She denies any racing thoughts irritability or impulsivity.  She states that her libido is much improved.  She is worried that  she could become manic but it does not sound like she has any manic symptoms at present.  She denies any thoughts of self-harm or suicide and denies significant anxiety.  She was having chest pain but an MI was ruled out it was thought to be musculoskeletal Visit Diagnosis:    ICD-10-CM   1. Bipolar 1 disorder (HCC)  F31.9     2. 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 She was also admitted for detox several years ago   Past Medical History:  Past Medical History:  Diagnosis Date   Allergic rhinitis    Anemia    Arthritis    Lower back and hips   Asthma    Bipolar disorder (HCC)    Bronchitis 05/2024   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HCC)    Constipation    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HCC)    Depression    Essential hypertension, benign    Fibromyalgia    GERD (gastroesophageal reflux disease)    H/O sleep apnea    Hip pain, left    History of palpitations    Negative Holter monitor   History of pneumonia 12/05/1986   Hyperlipidemia    IBS (irritable bowel syndrome)    Nausea and vomiting 02/16/2024   Neck pain    Obstructive sleep apnea (adult) (pediatric)  04/12/2021   Poor short term memory    PTSD (post-traumatic stress disorder)    S/P Botox injection 11/04/2014   For migraine headaches   Type 2 diabetes mellitus (HCC)    Urgency of urination     Past Surgical History:  Procedure Laterality Date   CARPAL TUNNEL RELEASE Right 06/10/2013   Procedure: CARPAL TUNNEL RELEASE;  Surgeon: Darina MALVA Boehringer, MD;  Location: [facility];  Service: Neurosurgery;  Laterality: Right;  Right Carpal Tunnel Release    CHOLECYSTECTOMY     COLONOSCOPY N/A 07/30/2024   Procedure: COLONOSCOPY;  Surgeon: Eartha Angelia Sieving, MD;  Location: [facility];  Service: Gastroenterology;  Laterality: N/A;   930am, asa 3   DENTAL SURGERY  12/05/2014   ESOPHAGOGASTRODUODENOSCOPY (EGD) WITH PROPOFOL  N/A 02/16/2024   Procedure: ESOPHAGOGASTRODUODENOSCOPY (EGD) WITH PROPOFOL ;  Surgeon: Eartha Angelia Sieving, MD;  Location: [facility];  Service: Gastroenterology;  Laterality: N/A;  11:30am;ASA 1   mole exc     x 3   MUSCLE BIOPSY  12/05/2006   Right leg   POLYPECTOMY  02/16/2024   Procedure: POLYPECTOMY;  Surgeon: Eartha Angelia, Sieving, MD;  Location: [facility];  Service: Gastroenterology;;   HARLEY DILATION  02/16/2024   Procedure: EGD, WITH DILATION USING SAVARY-GILLIARD DILATOR OVER GUIDEWIRE;  Surgeon: Eartha Angelia, Sieving, MD;  Location: [facility];  Service: Gastroenterology;;   TRIGGER FINGER RELEASE Right 03/2024   thumb   TUBAL LIGATION      Family Psychiatric History: See below  Family History:  Family History  Problem Relation Age of Onset   Fibromyalgia Mother    Diabetes type II Mother    Depression Mother    Alzheimer's disease Mother    GER disease Mother    Heart disease Mother    Paranoid behavior Mother    Dementia Mother    Heart attack Father 21       MI x5   Stroke Father        CVA x7   Asthma Father    Brain cancer Father    Seizures Father    Anxiety disorder Sister    OCD Sister    Sexual abuse Sister    Physical abuse Sister    Anxiety disorder Sister    Fibromyalgia Sister    Other Sister        Temporomandibular Joint   ADD / ADHD Daughter    ADD / ADHD Daughter    Alcohol abuse Neg Hx    Drug abuse Neg Hx    Schizophrenia Neg Hx     Social History:  Social History   Socioeconomic History   Marital status: Married    Spouse name: Not on file   Number of children: 2   Years of education: College   Highest education level: Associate degree: academic program  Occupational History   Occupation: Chiropractor: UNEMPLOYED    Comment: Now disabled due to bipolar and fibromyalgia  Tobacco Use   Smoking  status: Former    Current packs/day: 0.00    Types: Cigarettes    Quit date: 11/04/2010    Years since quitting: 13.7    Passive exposure: Past   Smokeless tobacco: Never  Vaping Use   Vaping status: Never Used  Substance and Sexual Activity   Alcohol use: Yes    Comment: one mixed drink per month   Drug use: No   Sexual activity: Yes    Birth control/protection: Surgical  Other Topics Concern   Not on file  Social History Narrative   Married   Lives with spouse and daughter   Right handed.   Caffeine use: 2 cups coffee in morning and 2 cups tea during the day    Social Drivers of Health   Financial Resource Strain: Low Risk  (05/23/2024)   Overall Financial Resource Strain (CARDIA)    Difficulty of Paying Living Expenses: Not very hard  Food Insecurity: No Food Insecurity (05/23/2024)   Hunger Vital Sign    Worried About Running Out of Food in the Last Year: Never true    Ran Out of Food in the Last Year: Never true  Transportation Needs: No Transportation Needs (05/23/2024)   PRAPARE - Administrator, Civil Service (Medical): No    Lack of Transportation (Non-Medical): No  Physical Activity: Insufficiently Active (05/23/2024)   Exercise Vital Sign    Days of Exercise per Week: 1 day    Minutes of Exercise per Session: 10 min  Stress: Stress Concern Present (05/23/2024)   [employer] of Occupational Health - Occupational Stress Questionnaire    Feeling of Stress: Rather much  Social Connections: Moderately Isolated (05/23/2024)   Social Connection and Isolation Panel    Frequency of Communication with Friends and Family: Twice a week    Frequency of Social Gatherings with Friends and Family: More than three times a week    Attends Religious Services: Never    Database administrator or Organizations: No    Attends Engineer, structural: Not on file    Marital Status: Married    Allergies:  Allergies  Allergen Reactions   Dust Mite Extract  Shortness Of Breath, Other (See Comments) and Cough    Wheezing     Gramineae Pollens Shortness Of Breath    wheezing   Misc. Sulfonamide Containing Compounds Anaphylaxis   Molds & Smuts Shortness Of Breath    wheezing     Other Anaphylaxis, Other (See Comments) and Shortness Of Breath    : Angioedema  wheezing  Wheezing  Other reaction(s): Cough  Itching and rash  Other reaction(s): Other (See Comments)   : Angioedema   Statins Anaphylaxis   Sulfa Antibiotics Anaphylaxis and Other (See Comments)    : Angioedema  : Angioedema  : Angioedema     : Angioedema    : Angioedema  : Angioedema   : Angioedema,  : Angioedema     : Angioedema,  : Angioedema   Sulfamethoxazole-Trimethoprim Anaphylaxis and Other (See Comments)   Trichophyton Shortness Of Breath    wheezing     Carbamazepine  Rash, Dermatitis and Other (See Comments)    Other Reaction(s): Other (See Comments)   Gluten Meal Itching and Other (See Comments)    Itching and rash Other reaction(s): Abdominal Pain    Quetiapine Other (See Comments)   Quetiapine Fumarate Other (See Comments)    Metabolic Disorder Labs: Lab Results  Component Value Date   HGBA1C 5.8 (A) 06/14/2024   MPG 131 03/18/2015   No results found for: PROLACTIN Lab Results  Component Value Date   CHOL 165 12/05/2023   TRIG 165 (H) 12/05/2023   HDL 49 12/05/2023   CHOLHDL 3.4 12/05/2023   VLDL 44 (H) 03/18/2015   LDLCALC 88 12/05/2023   LDLCALC 126 03/31/2022   Lab Results  Component Value Date   TSH 1.500 03/05/2024   TSH 1.030 10/27/2023    Therapeutic Level Labs: Lab Results  Component Value Date   LITHIUM  0.20 (L) 02/21/2012   No results found for: VALPROATE No results found for: CBMZ  Current Medications: Current Outpatient Medications  Medication Sig Dispense Refill   albuterol  (VENTOLIN  HFA) 108 (90 Base) MCG/ACT inhaler Inhale 2 puffs into the lungs every 6 (six) hours as needed for wheezing and/or  shortness of breath. 6.7 g 5   aspirin EC 81 MG tablet Take 1 tablet every day by oral route.     azelastine  (ASTELIN ) 0.1 % nasal spray Place 1 spray into both nostrils 2 (two) times daily. 30 mL 2   Blood Glucose Monitoring Suppl (ONETOUCH VERIO FLEX SYSTEM) w/Device KIT Use as directed to check blood glucose twice daily, before breakfast and before bed. 1 kit 0   Blood Glucose Monitoring Suppl (ONETOUCH VERIO) w/Device KIT Use to check glucose once daily 1 kit 0   buPROPion  (WELLBUTRIN  XL) 150 MG 24 hr tablet Take 1 tablet (150 mg total) by mouth every morning. 30 tablet 2   cetirizine  (ZYRTEC ) 10 MG tablet Take 10 mg by mouth at bedtime.     clonazePAM  (KLONOPIN ) 0.5 MG tablet Take 1 tablet (0.5 mg total) by mouth at bedtime. 30 tablet 2   Coenzyme Q10 (COQ10 PO) Take by mouth. One daily     Collagen Hydrolysate POWD by Does not apply route.     Continuous Glucose Sensor (FREESTYLE LIBRE 3 PLUS SENSOR) MISC Change sensor every 15 days. 6 each 3   cycloSPORINE  (RESTASIS ) 0.05 % ophthalmic emulsion Place 1 drop into both eyes 2 (two) times daily. 180 each 99   dapagliflozin  propanediol (FARXIGA ) 10 MG TABS tablet Take 1 tablet (10 mg total) by mouth in the morning. 30 tablet 11   eletriptan (RELPAX) 40 MG tablet Take by mouth. (Patient not taking: Reported on 08/02/2024)     estradiol  (ESTRACE ) 0.1 MG/GM vaginal cream Place 1 Applicatorful vaginally 3 (three) times a week. 42.5 g 1   FLUoxetine  (PROZAC ) 20 MG capsule Take 1 capsule (20 mg total) by mouth daily. 30 capsule 2   FLUoxetine  (PROZAC ) 40 MG capsule Take 1 capsule (40 mg total) by mouth daily. 30 capsule 2   fluticasone  (FLONASE ) 50 MCG/ACT nasal spray Place 1 spray into both nostrils daily. 16 g 3   Fluticasone -Umeclidin-Vilant (TRELEGY ELLIPTA ) 100-62.5-25 MCG/ACT AEPB Inhale 1 puff into the lungs.     Galcanezumab -gnlm (EMGALITY ) 120 MG/ML SOSY Inject 1 mL into the skin every 30 (thirty) days. 1 mL 11   Immune Globulin , Human,-klhw  (XEMBIFY ) 10 GM/50ML SOLN Inject 10 g into the skin every 7 (seven) days. 200 mL 3   insulin  glargine (LANTUS ) 100 UNIT/ML Solostar Pen Inject 40 Units into the skin at bedtime. 36 mL 3   ipratropium-albuterol  (DUONEB) 0.5-2.5 (3) MG/3ML SOLN Take 3 mLs by nebulization 2 (two) times daily as needed. 90 mL 2   lamoTRIgine  (LAMICTAL ) 25 MG tablet Take 1 tablet (25 mg total) by mouth 2 (two) times daily. 60 tablet 2   Lancets (ONETOUCH ULTRASOFT) lancets Use to check blood sugar once daily 100 each 3   Latanoprostene Bunod  (VYZULTA ) 0.024 % SOLN Place 1 drop into both eyes at bedtime as directed 7.5 mL 3   lubiprostone  (AMITIZA ) 24 MCG capsule Take 1 capsule (24 mcg total) by mouth 2 (two) times daily. 60 capsule 3   Melatonin 10 MG TABS  (Patient not taking: Reported on 08/02/2024)     omeprazole  (PRILOSEC) 20 MG capsule Take 1 capsule (20 mg  total) by mouth 2 (two) times daily. 60 capsule 2   ondansetron  (ZOFRAN -ODT) 8 MG disintegrating tablet Dissolve 1 tablet (8 mg total) by mouth every 8 (eight) hours as needed for nausea or vomiting. 20 tablet 6   ONETOUCH VERIO test strip Use to check glucose once daily. 100 each 3   OVER THE COUNTER MEDICATION Magnessium 500 mg at night     pregabalin  (LYRICA ) 200 MG capsule Take 1 capsule (200 mg total) by mouth 3 (three) times daily. 90 capsule 0   rosuvastatin  (CRESTOR ) 40 MG tablet Take 1 tablet (40 mg total) by mouth at bedtime. 90 tablet 1   sennosides-docusate sodium (SENOKOT-S) 8.6-50 MG tablet Take 2 tablets by mouth at bedtime.     Suzetrigine  (JOURNAVX ) 50 MG TABS Take 1 tablet by mouth in the morning and at bedtime.     tiZANidine  (ZANAFLEX ) 4 MG tablet Take 1 tablet (4 mg total) by mouth every 12 (twelve) hours as needed. 60 tablet 11   traMADol  (ULTRAM ) 50 MG tablet Take 1 tablet (50 mg total) by mouth every 6 (six) hours as needed. 15 tablet 0   traZODone  (DESYREL ) 50 MG tablet Take 1 tablet (50 mg total) by mouth at bedtime. 30 tablet 2    UNABLE TO FIND 1 each by Does not apply route daily. Med Name: CPAP SUPPLIES Full face mask Cushion for mask -Medium ResMed 1 each 11   VITAMIN D  PO Take 5,000 Units by mouth daily.     zinc gluconate 50 MG tablet Take 50 mg by mouth daily.     zolmitriptan  (ZOMIG ) 5 MG nasal solution Administer 5 mg into affected nostril(s) as needed (1 nasal spray at onset of severe headache; may repeat once after 2 hours if headache persists; max of 2 doses in 24 hours or 4/week.). 12 each 5   zonisamide  (ZONEGRAN ) 50 MG capsule Take 1 capsule by mouth at bedtime for 5 days then increase to 2 capsules at bedtime thereafter. 60 capsule 3   No current facility-administered medications for this visit.     Musculoskeletal: Strength & Muscle Tone: within normal limits Gait & Station: normal Patient leans: N/A  Psychiatric Specialty Exam: Review of Systems  Musculoskeletal:  Positive for arthralgias and myalgias.  All other systems reviewed and are negative.   Last menstrual period 01/04/2018.There is no height or weight on file to calculate BMI.  General Appearance: Casual and Fairly Groomed  Eye Contact:  Good  Speech:  Clear and Coherent  Volume:  Normal  Mood:  Euthymic  Affect:  Congruent  Thought Process:  Goal Directed  Orientation:  Full (Time, Place, and Person)  Thought Content: WDL   Suicidal Thoughts:  No  Homicidal Thoughts:  No  Memory:  Immediate;   Good Recent;   Good Remote;   NA  Judgement:  Good  Insight:  Good  Psychomotor Activity:  Normal  Concentration:  Concentration: Good and Attention Span: Good  Recall:  Good  Fund of Knowledge: Good  Language: Good  Akathisia:  No  Handed:  Right  AIMS (if indicated): not done  Assets:  Communication Skills Desire for Improvement Resilience Social Support Talents/Skills  ADL's:  Intact  Cognition: WNL  Sleep:  Good   Screenings: GAD-7    Flowsheet Row Video Visit from 05/23/2024 in [facility]  Office Visit from 03/08/2024 in [facility] Telemedicine from 01/25/2024 in [facility] Office Visit from 12/05/2023 in  [facility]  Total GAD-7 Score 0 13 10 10    Mini-Mental    Flowsheet Row Office Visit from 08/29/2019 in [facility]  Total Score (max 30 points ) 27   PHQ2-9    Flowsheet Row Office Visit from 07/18/2024 in [facility] [place] Video Visit from 05/23/2024 in [facility] Office Visit from 03/08/2024 in [facility] Telemedicine from 01/25/2024 in [facility] Office Visit from 12/05/2023 in [facility]  PHQ-2 Total Score 0 0 4 2 2   PHQ-9 Total Score -- 0 17 13 13    Flowsheet Row ED from 08/02/2024 in [facility] Emergency Department at [facility] Admission (Discharged) from 07/30/2024 in [facility] Admission (Discharged) from 02/16/2024 in [facility]  C-SSRS RISK CATEGORY No Risk No Risk No Risk     Assessment and Plan: This patient is a 58 year old female with history of PTSD possible bipolar disorder depression anxiety hallucinations in the past.  She also has chronic fatigue and immune deficiency disorder.  She seems much improved with the increase of Prozac  to 60 mg daily for depression and obsessional thoughts.  She will continue this as well as Wellbutrin  XL 150 mg daily for depression and trazodone  50 mg at bedtime, clonazepam  0.5 mg at bedtime as needed for anxiety and Lamictal  25 mg twice daily for mood stabilization.  She will return to see me in 2 months or call sooner as needed  Collaboration of Care: Collaboration of Care: Referral or follow-up with counselor/therapist AEB patient has been referred to therapist Winton Rubinstein  Patient/Guardian was advised Release of Information must be obtained prior to any  record release in order to collaborate their care with an outside provider. Patient/Guardian was advised if they have not already done so to contact the registration department to sign all necessary forms in order for us  to release information regarding their care.   Consent: Patient/Guardian gives verbal consent for treatment and assignment of benefits for services provided during this visit. Patient/Guardian expressed understanding and agreed to proceed.    Michelle Gull, MD 08/06/2024, 9:38 AM

## 2024-08-07 ENCOUNTER — Other Ambulatory Visit (HOSPITAL_COMMUNITY): Payer: Self-pay

## 2024-08-07 ENCOUNTER — Inpatient Hospital Stay: Admitting: Physician Assistant

## 2024-08-07 ENCOUNTER — Encounter (HOSPITAL_COMMUNITY): Payer: Self-pay | Admitting: Hematology & Oncology

## 2024-08-07 ENCOUNTER — Other Ambulatory Visit: Payer: Self-pay

## 2024-08-07 MED ORDER — JOURNAVX 50 MG PO TABS
ORAL_TABLET | ORAL | 0 refills | Status: DC
  Filled 2024-08-07 – 2024-08-26 (×3): qty 30, 15d supply, fill #0

## 2024-08-08 ENCOUNTER — Other Ambulatory Visit (HOSPITAL_COMMUNITY): Payer: Self-pay

## 2024-08-08 ENCOUNTER — Other Ambulatory Visit: Payer: Self-pay

## 2024-08-09 ENCOUNTER — Other Ambulatory Visit: Payer: Self-pay

## 2024-08-09 ENCOUNTER — Other Ambulatory Visit (HOSPITAL_COMMUNITY): Payer: Self-pay

## 2024-08-09 ENCOUNTER — Other Ambulatory Visit: Payer: Self-pay | Admitting: Internal Medicine

## 2024-08-09 MED ORDER — OMEPRAZOLE 20 MG PO CPDR
20.0000 mg | DELAYED_RELEASE_CAPSULE | Freq: Two times a day (BID) | ORAL | 2 refills | Status: DC
  Filled 2024-08-09 – 2024-08-13 (×2): qty 60, 30d supply, fill #0
  Filled 2024-09-10: qty 60, 30d supply, fill #1
  Filled 2024-10-14: qty 60, 30d supply, fill #2

## 2024-08-12 ENCOUNTER — Other Ambulatory Visit (HOSPITAL_COMMUNITY): Payer: Self-pay

## 2024-08-12 ENCOUNTER — Encounter (INDEPENDENT_AMBULATORY_CARE_PROVIDER_SITE_OTHER): Payer: Self-pay | Admitting: *Deleted

## 2024-08-12 NOTE — Progress Notes (Signed)
 3 yr TCS noted in recall Patient result letter mailed Patient's PCP is on EPIC

## 2024-08-13 ENCOUNTER — Other Ambulatory Visit: Payer: Self-pay

## 2024-08-14 ENCOUNTER — Other Ambulatory Visit: Payer: Self-pay

## 2024-08-16 ENCOUNTER — Other Ambulatory Visit (HOSPITAL_COMMUNITY): Payer: Self-pay

## 2024-08-17 ENCOUNTER — Other Ambulatory Visit (HOSPITAL_COMMUNITY): Payer: Self-pay

## 2024-08-19 ENCOUNTER — Other Ambulatory Visit: Payer: Self-pay

## 2024-08-20 ENCOUNTER — Other Ambulatory Visit (HOSPITAL_COMMUNITY): Payer: Self-pay

## 2024-08-20 ENCOUNTER — Encounter: Payer: Self-pay | Admitting: Nurse Practitioner

## 2024-08-20 DIAGNOSIS — D801 Nonfamilial hypogammaglobulinemia: Secondary | ICD-10-CM | POA: Diagnosis not present

## 2024-08-20 DIAGNOSIS — M47812 Spondylosis without myelopathy or radiculopathy, cervical region: Secondary | ICD-10-CM | POA: Diagnosis not present

## 2024-08-21 ENCOUNTER — Other Ambulatory Visit (HOSPITAL_COMMUNITY): Payer: Self-pay

## 2024-08-22 ENCOUNTER — Other Ambulatory Visit: Payer: Self-pay

## 2024-08-22 ENCOUNTER — Other Ambulatory Visit (HOSPITAL_COMMUNITY): Payer: Self-pay

## 2024-08-24 DIAGNOSIS — G4733 Obstructive sleep apnea (adult) (pediatric): Secondary | ICD-10-CM | POA: Diagnosis not present

## 2024-08-26 ENCOUNTER — Other Ambulatory Visit (HOSPITAL_COMMUNITY): Payer: Self-pay

## 2024-08-26 ENCOUNTER — Other Ambulatory Visit: Payer: Self-pay

## 2024-08-26 ENCOUNTER — Encounter (HOSPITAL_COMMUNITY): Payer: Self-pay | Admitting: Hematology & Oncology

## 2024-08-26 MED ORDER — VYZULTA 0.024 % OP SOLN
1.0000 [drp] | Freq: Every day | OPHTHALMIC | 3 refills | Status: DC
  Filled 2024-08-26: qty 2.5, 75d supply, fill #0

## 2024-08-27 ENCOUNTER — Other Ambulatory Visit: Payer: Self-pay

## 2024-08-27 ENCOUNTER — Encounter: Payer: Self-pay | Admitting: Physician Assistant

## 2024-08-27 NOTE — Telephone Encounter (Signed)
**Note De-identified  [place] Obfuscation** Please advise 

## 2024-08-28 ENCOUNTER — Other Ambulatory Visit (HOSPITAL_COMMUNITY): Payer: Self-pay

## 2024-08-28 ENCOUNTER — Other Ambulatory Visit: Payer: Self-pay

## 2024-08-28 MED ORDER — TRAMADOL HCL 50 MG PO TABS
50.0000 mg | ORAL_TABLET | Freq: Four times a day (QID) | ORAL | 0 refills | Status: DC | PRN
  Filled 2024-08-28: qty 120, 30d supply, fill #0

## 2024-08-29 ENCOUNTER — Other Ambulatory Visit (HOSPITAL_COMMUNITY): Payer: Self-pay

## 2024-08-29 ENCOUNTER — Other Ambulatory Visit: Payer: Self-pay

## 2024-08-29 NOTE — Telephone Encounter (Signed)
 FYI

## 2024-09-03 ENCOUNTER — Ambulatory Visit: Admitting: Internal Medicine

## 2024-09-03 ENCOUNTER — Other Ambulatory Visit: Payer: Self-pay

## 2024-09-03 DIAGNOSIS — D801 Nonfamilial hypogammaglobulinemia: Secondary | ICD-10-CM | POA: Diagnosis not present

## 2024-09-03 NOTE — Progress Notes (Deleted)
 Office Visit Note  Patient: Michelle Clements             Date of Birth: November 04, 1966           MRN: [id number]             PCP: Bevely Doffing, FNP Referring: Bevely Doffing, FNP Visit Date: 09/03/2024   Subjective:  No chief complaint on file.   History of Present Illness: Michelle Clements is a 58 y.o. female here for follow up ***   Previous HPI 05/31/24 Michelle Clements is a 58 year old female with fibromyalgia and CVID who presents with worsening joint and muscle pain with abnormal lab results including a positive ANA.  She has a long-standing history of fibromyalgia, diagnosed at age 50, characterized by body aches and pains. Over the past few years, she has noticed significant muscle wasting, particularly in her arms and legs, and a decrease in strength. Joint pain has worsened, affecting her hands, elbows, shoulders, neck, spine, hips, knees, and ankles. Her thumb knuckle is described as 'horrible' despite a previous trigger release surgery. She experiences difficulty gripping and holding objects, and her elbows are sore on the inside. Her feet have a burning, swollen sensation, attributed to neuropathy.  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 She recently experienced a severe episode of bronchitis with asthma exacerbation, treated with antibiotics and a Medrol  pack, which she completed two days ago. She reports occasional colds but no other recent serious infections.  Her medication regimen includes Lyrica  for fibromyalgia pain and fluoxetine , which she has been on for several years. She does not take maintenance steroids but recently completed a course of methylprednisolone  for bronchitis and asthma.  No new skin rashes, abnormal bleeding, or significant lymph node swelling. She experiences poor sleep, waking frequently despite taking over-the-counter melatonin  and valerian root supplements. She reports a change from chronic constipation to severe diarrhea recently.  Her social history includes a diet she describes as 'pretty good,' with a guilty pleasure of kettle corn popcorn. She drinks a lot of water and eats salmon three times a week.   No raynaud's symptoms. No photosensitive skin eruptions. No history of blood clots or abnormal bleeding.     Labs reviewed ANA 1:80 DFS RNP 2.8 Rest 11 Ab panel neg   No Rheumatology ROS completed.   PMFS History:  Patient Active Problem List   Diagnosis Date Noted   Connective tissue disease 07/19/2024   Positive ANA (antinuclear antibody) 05/31/2024   Malodorous urine 04/16/2024   Right flank pain 04/16/2024   Breast cancer screening by mammogram 03/08/2024   Right-sided chest wall pain 03/08/2024   Nausea and vomiting 02/16/2024   Asthma exacerbation, mild 01/25/2024   Choking 01/08/2024   Dysphagia 01/08/2024   Incomplete defecation 01/08/2024   Chronic pain syndrome 12/17/2023   Chronic back pain 12/17/2023   Anxiety and depression 12/17/2023   Vaginal atrophy 12/17/2023   Osteoarthritis of sacroiliac region 12/05/2023   Hardening of the aorta (main artery of the heart) 12/04/2023   Intractable chronic migraine without aura 07/25/2023   Recurrent falls 06/27/2023   Abnormal gait 03/22/2023   Osteoarthritis 02/03/2023   Restless legs 02/03/2023   Hyperkalemia 11/21/2022   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) 11/21/2022   Multiple joint pain 11/09/2022   Insomnia 07/19/2022   Dysphonia 04/06/2022  Elevated blood-pressure reading without diagnosis of hypertension 12/31/2021   Galactorrhea not associated with childbirth 10/13/2021   Major depressive disorder, recurrent episode, severe (HCC) 08/27/2021   Suicidal overdose, subsequent encounter 08/11/2021   Chronic kidney disease, stage 3b (HCC) 07/12/2021   Type 2 diabetes mellitus with diabetic chronic kidney  disease (HCC) 07/12/2021   Fatigue 07/12/2021   Increased frequency of urination 06/02/2021   Moderate persistent asthma 05/05/2021   Memory impairment 05/05/2021   Sleep terror disorder 04/30/2021   Non-alcoholic fatty liver disease 04/30/2021   Hypercalcemia 04/30/2021   Generalized anxiety disorder 04/30/2021   Dysuria 04/30/2021   Iron deficiency anemia 04/12/2021   Immunodeficiency disorder 04/12/2021   Hypothyroidism 04/12/2021   Obstructive sleep apnea (adult) (pediatric) 04/12/2021   Obstructive sleep apnea syndrome 04/12/2021   Primary osteoarthritis of left hip 09/25/2018   Trochanteric bursitis, left hip 09/25/2018   Cervical spondylosis with radiculopathy 05/31/2016   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   Migraine 10/28/2014   Neck pain of over 3 months duration 10/28/2014   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   Headache 11/06/2013   Cervicalgia 11/06/2013   Acute confusional state 11/06/2013   Carpal tunnel syndrome 05/17/2013   Neuropathy of upper extremity 04/23/2013   Change in bowel habits 06/30/2009   Vitamin D  deficiency 02/13/2009   Morbid obesity (HCC) 02/13/2009   BIPOLAR AFFECTIVE DISORDER 11/19/2007   Essential hypertension, benign 11/16/2007   Mixed hyperlipidemia 10/03/2007   Hx of tobacco use, presenting hazards to health 10/03/2007   DEPRESSION 10/03/2007   IBS 10/03/2007   Fibromyalgia 10/03/2007   Palpitations 10/03/2007    Past Medical History:  Diagnosis Date   Allergic rhinitis    Anemia    Arthritis    Lower back and hips   Asthma    Bipolar disorder (HCC)    Bronchitis 05/2024   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HCC)    Constipation    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HCC)    Depression    Essential hypertension, benign    Fibromyalgia    GERD (gastroesophageal reflux disease)    H/O sleep apnea    Hip pain, left    History of palpitations    Negative Holter monitor   History of pneumonia 12/05/1986    Hyperlipidemia    IBS (irritable bowel syndrome)    Nausea and vomiting 02/16/2024   Neck pain    Obstructive sleep apnea (adult) (pediatric) 04/12/2021   Poor short term memory    PTSD (post-traumatic stress disorder)    S/P Botox injection 11/04/2014   For migraine headaches   Type 2 diabetes mellitus (HCC)    Urgency of urination     Family History  Problem Relation Age of Onset   Fibromyalgia Mother    Diabetes type II Mother    Depression Mother    Alzheimer's disease Mother    GER disease Mother    Heart disease Mother    Paranoid behavior Mother    Dementia Mother    Heart attack Father 29       MI x5   Stroke Father        CVA x7   Asthma Father    Brain cancer Father    Seizures Father    Anxiety disorder Sister    OCD Sister    Sexual abuse Sister    Physical abuse Sister    Anxiety disorder Sister    Fibromyalgia Sister    Other Sister        Temporomandibular Joint  ADD / ADHD Daughter    ADD / ADHD Daughter    Alcohol abuse Neg Hx    Drug abuse Neg Hx    Schizophrenia Neg Hx    Past Surgical History:  Procedure Laterality Date   CARPAL TUNNEL RELEASE Right 06/10/2013   Procedure: CARPAL TUNNEL RELEASE;  Surgeon: Darina MALVA Boehringer, MD;  Location: [facility];  Service: Neurosurgery;  Laterality: Right;  Right Carpal Tunnel Release    CHOLECYSTECTOMY     COLONOSCOPY N/A 07/30/2024   Procedure: COLONOSCOPY;  Surgeon: Eartha Angelia Sieving, MD;  Location: [facility];  Service: Gastroenterology;  Laterality: N/A;  930am, asa 3   DENTAL SURGERY  12/05/2014   ESOPHAGOGASTRODUODENOSCOPY (EGD) WITH PROPOFOL  N/A 02/16/2024   Procedure: ESOPHAGOGASTRODUODENOSCOPY (EGD) WITH PROPOFOL ;  Surgeon: Eartha Angelia Sieving, MD;  Location: [facility];  Service: Gastroenterology;  Laterality: N/A;  11:30am;ASA 1   mole exc     x 3   MUSCLE BIOPSY  12/05/2006   Right leg   POLYPECTOMY  02/16/2024   Procedure: POLYPECTOMY;  Surgeon: Eartha Angelia Sieving, MD;  Location: [facility];  Service: Gastroenterology;;   HARLEY DILATION  02/16/2024   Procedure: EGD, WITH DILATION USING SAVARY-GILLIARD DILATOR OVER GUIDEWIRE;  Surgeon: Eartha Angelia Sieving, MD;  Location: [facility];  Service: Gastroenterology;;   TRIGGER FINGER RELEASE Right 03/2024   thumb   TUBAL LIGATION     Social History   Social History Narrative   Married   Lives with spouse and daughter   Right handed.   Caffeine use: 2 cups coffee in morning and 2 cups tea during the day    Immunization History  Administered Date(s) Administered   Fluzone Influenza virus vaccine,trivalent (IIV3), split virus 09/24/2009   Influenza Whole 10/03/2007   Influenza,inj,Quad PF,6+ Mos 10/03/2018, 09/01/2021   Influenza,inj,quad, With Preservative 09/26/2019   Influenza-Unspecified 09/18/2014, 09/23/2015, 09/04/2017   Novel Infuenza-h1n1-09 01/26/2009   Pneumococcal Polysaccharide-23 10/14/2012   Td 01/21/2019     Objective: Vital Signs: LMP 01/04/2018    Physical Exam   Musculoskeletal Exam: ***  CDAI Exam: CDAI Score: -- Patient Global: --; Provider Global: -- Swollen: --; Tender: -- Joint Exam 09/03/2024   No joint exam has been documented for this visit   There is currently no information documented on the homunculus. Go to the Rheumatology activity and complete the homunculus joint exam.  Investigation: No additional findings.  Imaging: No results found.  Recent Labs: Lab Results  Component Value Date   WBC 7.4 08/02/2024   HGB 15.0 08/02/2024   PLT 271 08/02/2024   NA 139 08/02/2024   K 4.2 08/02/2024   CL 104 08/02/2024   CO2 23 08/02/2024   GLUCOSE 96 08/02/2024   BUN 14 08/02/2024   CREATININE 1.12 (H) 08/02/2024   BILITOT 0.4 07/09/2024   ALKPHOS 99 07/09/2024   AST 36 07/09/2024   ALT 50 (H) 07/09/2024   PROT 7.3 07/09/2024   ALBUMIN 5.1 (H) 07/09/2024   CALCIUM  9.6 08/02/2024   GFRAA 50 12/29/2020    Speciality Comments:  No specialty comments available.  Procedures:  No procedures performed Allergies: Dust mite extract, Gramineae pollens, Misc. sulfonamide containing compounds, Molds & smuts, Other, Statins, Sulfa antibiotics, Sulfamethoxazole-trimethoprim, Trichophyton, Carbamazepine , Gluten meal, Quetiapine, and Quetiapine fumarate   Assessment / Plan:     Visit Diagnoses: No diagnosis found.  ***  Orders: No orders of the defined types were placed in this encounter.  No orders of the defined  types were placed in this encounter.    Follow-Up Instructions: No follow-ups on file.   Lonni LELON Ester, MD  Note - This record has been created using [employer].  Chart creation errors have been sought, but may not always  have been located. Such creation errors do not reflect on  the standard of medical care.

## 2024-09-04 ENCOUNTER — Ambulatory Visit: Admitting: Gastroenterology

## 2024-09-05 ENCOUNTER — Other Ambulatory Visit (HOSPITAL_COMMUNITY): Payer: Self-pay

## 2024-09-05 ENCOUNTER — Ambulatory Visit: Admitting: Physical Therapy

## 2024-09-05 DIAGNOSIS — F3181 Bipolar II disorder: Secondary | ICD-10-CM | POA: Diagnosis not present

## 2024-09-06 ENCOUNTER — Other Ambulatory Visit: Payer: Self-pay

## 2024-09-06 ENCOUNTER — Encounter: Payer: Self-pay | Admitting: *Deleted

## 2024-09-06 NOTE — Progress Notes (Signed)
 Michelle Clements                                          MRN: [id number]   09/06/2024   The [facility] Quality Team Specialist reviewed this patient medical record for the purposes of chart review for care gap closure. The following were reviewed: chart review for care gap closure-kidney health evaluation for diabetes:eGFR  and uACR.    [facility] Quality Team

## 2024-09-10 ENCOUNTER — Other Ambulatory Visit: Payer: Self-pay

## 2024-09-11 ENCOUNTER — Other Ambulatory Visit: Payer: Self-pay

## 2024-09-11 ENCOUNTER — Other Ambulatory Visit (HOSPITAL_BASED_OUTPATIENT_CLINIC_OR_DEPARTMENT_OTHER): Payer: Self-pay

## 2024-09-11 ENCOUNTER — Other Ambulatory Visit (HOSPITAL_COMMUNITY): Payer: Self-pay

## 2024-09-11 MED ORDER — VYZULTA 0.024 % OP SOLN
1.0000 [drp] | Freq: Every day | OPHTHALMIC | 3 refills | Status: AC
  Filled 2024-09-11: qty 15, 150d supply, fill #0
  Filled 2024-09-12 – 2024-10-04 (×3): qty 7.5, 75d supply, fill #0
  Filled 2024-11-07: qty 2.5, 25d supply, fill #0
  Filled 2024-11-26: qty 2.5, 25d supply, fill #1
  Filled 2024-12-30: qty 2.5, 25d supply, fill #2

## 2024-09-12 ENCOUNTER — Other Ambulatory Visit: Payer: Self-pay

## 2024-09-12 NOTE — Telephone Encounter (Signed)
 Ladonna: please move her up to see me if possible.   Mindy/Tammy; please refer to PT for pelvic floor biofeedback due to pelvic floor dysfunction.

## 2024-09-13 ENCOUNTER — Other Ambulatory Visit: Payer: Self-pay | Admitting: *Deleted

## 2024-09-13 DIAGNOSIS — M6289 Other specified disorders of muscle: Secondary | ICD-10-CM

## 2024-09-13 DIAGNOSIS — H18513 Endothelial corneal dystrophy, bilateral: Secondary | ICD-10-CM | POA: Diagnosis not present

## 2024-09-13 DIAGNOSIS — H2513 Age-related nuclear cataract, bilateral: Secondary | ICD-10-CM | POA: Diagnosis not present

## 2024-09-13 DIAGNOSIS — E119 Type 2 diabetes mellitus without complications: Secondary | ICD-10-CM | POA: Diagnosis not present

## 2024-09-13 DIAGNOSIS — H401131 Primary open-angle glaucoma, bilateral, mild stage: Secondary | ICD-10-CM | POA: Diagnosis not present

## 2024-09-13 DIAGNOSIS — H2511 Age-related nuclear cataract, right eye: Secondary | ICD-10-CM | POA: Diagnosis not present

## 2024-09-13 NOTE — Telephone Encounter (Signed)
 Referral sent

## 2024-09-16 ENCOUNTER — Other Ambulatory Visit: Payer: Self-pay

## 2024-09-16 ENCOUNTER — Other Ambulatory Visit (HOSPITAL_COMMUNITY): Payer: Self-pay

## 2024-09-17 DIAGNOSIS — D801 Nonfamilial hypogammaglobulinemia: Secondary | ICD-10-CM | POA: Diagnosis not present

## 2024-09-18 ENCOUNTER — Encounter (INDEPENDENT_AMBULATORY_CARE_PROVIDER_SITE_OTHER): Payer: Self-pay | Admitting: Gastroenterology

## 2024-09-19 ENCOUNTER — Encounter: Payer: Self-pay | Admitting: *Deleted

## 2024-09-19 ENCOUNTER — Encounter: Payer: Self-pay | Admitting: Gastroenterology

## 2024-09-19 ENCOUNTER — Ambulatory Visit: Admitting: Gastroenterology

## 2024-09-19 VITALS — BP 129/83 | HR 101 | Temp 98.4°F | Ht 71.0 in | Wt 220.6 lb

## 2024-09-19 DIAGNOSIS — K635 Polyp of colon: Secondary | ICD-10-CM | POA: Diagnosis not present

## 2024-09-19 DIAGNOSIS — F3181 Bipolar II disorder: Secondary | ICD-10-CM | POA: Diagnosis not present

## 2024-09-19 DIAGNOSIS — R634 Abnormal weight loss: Secondary | ICD-10-CM | POA: Diagnosis not present

## 2024-09-19 DIAGNOSIS — K581 Irritable bowel syndrome with constipation: Secondary | ICD-10-CM

## 2024-09-19 DIAGNOSIS — R197 Diarrhea, unspecified: Secondary | ICD-10-CM | POA: Diagnosis not present

## 2024-09-19 DIAGNOSIS — F331 Major depressive disorder, recurrent, moderate: Secondary | ICD-10-CM | POA: Diagnosis not present

## 2024-09-19 NOTE — Patient Instructions (Signed)
 Let's stop Amitiza  for now.   Let me know what your body does with stopping this!  I have ordered a CT scan. Further recommendations once this is done!  I will see you in 2 months regardless!  I enjoyed seeing you again today! I value our relationship and want to provide genuine, compassionate, and quality care. You may receive a survey regarding your visit with me, and I welcome your feedback! Thanks so much for taking the time to complete this. I look forward to seeing you again.      Therisa MICAEL Stager, PhD, ANP-BC [facility]

## 2024-09-19 NOTE — Progress Notes (Addendum)
 Gastroenterology Office Note     Primary Care Physician:  Bevely Doffing, FNP  Primary Gastroenterologist: Dr Eartha    Chief Complaint   Chief Complaint  Patient presents with   Follow-up     History of Present Illness   Michelle Clements is a 58 y.o. 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   Today: Appetite not good last few days. Lost 9 lbs since May 2025. Unintentional. She is under stress but did not elaborate. Tearful. Some nocturnal incontinence but improved. Since colonoscopy has not been as frequent. Has been taking Amitiza  24 mcg po IBD but having diarrhea.   Thyroid : due to check in November. Hgb 15.    Some days are ok. Some days can't leave the house. Stool is watery, has some chunks, splashes into toilet. Just sees dirty water. A film in the toilet. Sometimes has anywhere from one up to 7. Hasn't noticed triggers. Avoids fried foods. Tries to avoid.      Colonoscopy Aug 2025: Six 2 to 6 mm polyps in the transverse colon and                            in the ascending colon, removed with a cold snare.                            Resected and retrieved.                           - One 5 mm polyp in the descending colon, removed                            with a cold snare. Resected and retrieved.                           - Non-bleeding internal hemorrhoids.    Path with tubular adenomas and surveillance in 3 years.    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   EGD March 2025: empiric dilation, few gastric polyps s/p resection and retrieval of one, normal stomach s/p biopsy. Surveillance in 2 years  for gastric mapping due to metaplasia.    Past Medical History:  Diagnosis Date   Allergic rhinitis    Anemia    Arthritis    Lower back and hips   Asthma    Bipolar disorder (HCC)    Bronchitis 05/2024   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HCC)    Constipation    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HCC)    Depression    Essential hypertension, benign    Fibromyalgia    GERD (gastroesophageal reflux disease)    H/O sleep apnea    Hip pain, left    History of palpitations    Negative Holter monitor   History of pneumonia 12/05/1986   Hyperlipidemia    IBS (irritable bowel syndrome)    Nausea and vomiting 02/16/2024   Neck pain    Obstructive sleep apnea (adult) (pediatric) 04/12/2021   Poor short term memory  PTSD (post-traumatic stress disorder)    S/P Botox injection 11/04/2014   For migraine headaches   Type 2 diabetes mellitus (HCC)    Urgency of urination     Past Surgical History:  Procedure Laterality Date   CARPAL TUNNEL RELEASE Right 06/10/2013   Procedure: CARPAL TUNNEL RELEASE;  Surgeon: Darina MALVA Boehringer, MD;  Location: [facility];  Service: Neurosurgery;  Laterality: Right;  Right Carpal Tunnel Release    CHOLECYSTECTOMY     COLONOSCOPY N/A 07/30/2024   Procedure: COLONOSCOPY;  Surgeon: Eartha Angelia Sieving, MD;  Location: [facility];  Service: Gastroenterology;  Laterality: N/A;  930am, asa 3   DENTAL SURGERY  12/05/2014   ESOPHAGOGASTRODUODENOSCOPY (EGD) WITH PROPOFOL  N/A 02/16/2024   Procedure: ESOPHAGOGASTRODUODENOSCOPY (EGD) WITH PROPOFOL ;  Surgeon: Eartha Angelia Sieving, MD;  Location: [facility];  Service: Gastroenterology;  Laterality: N/A;  11:30am;ASA 1   mole exc     x 3   MUSCLE BIOPSY  12/05/2006   Right leg   POLYPECTOMY  02/16/2024   Procedure: POLYPECTOMY;  Surgeon: Eartha Angelia Sieving, MD;  Location: [facility];  Service: Gastroenterology;;   HARLEY DILATION  02/16/2024   Procedure: EGD, WITH DILATION USING  SAVARY-GILLIARD DILATOR OVER GUIDEWIRE;  Surgeon: Eartha Angelia, Sieving, MD;  Location: [facility];  Service: Gastroenterology;;   TRIGGER FINGER RELEASE Right 03/2024   thumb   TUBAL LIGATION      Current Outpatient Medications  Medication Sig Dispense Refill   albuterol  (VENTOLIN  HFA) 108 (90 Base) MCG/ACT inhaler Inhale 2 puffs into the lungs every 6 (six) hours as needed for wheezing and/or shortness of breath. 6.7 g 5   aspirin EC 81 MG tablet Take 1 tablet every day by oral route.     azelastine  (ASTELIN ) 0.1 % nasal spray Place 1 spray into both nostrils 2 (two) times daily. 30 mL 2   Blood Glucose Monitoring Suppl (ONETOUCH VERIO FLEX SYSTEM) w/Device KIT Use as directed to check blood glucose twice daily, before breakfast and before bed. 1 kit 0   Blood Glucose Monitoring Suppl (ONETOUCH VERIO) w/Device KIT Use to check glucose once daily 1 kit 0   buPROPion  (WELLBUTRIN  XL) 150 MG 24 hr tablet Take 1 tablet (150 mg total) by mouth every morning. 30 tablet 2   cetirizine  (ZYRTEC ) 10 MG tablet Take 10 mg by mouth at bedtime.     clonazePAM  (KLONOPIN ) 0.5 MG tablet Take 1 tablet (0.5 mg total) by mouth at bedtime. 30 tablet 2   Coenzyme Q10 (COQ10 PO) Take by mouth. One daily     Continuous Glucose Sensor (FREESTYLE LIBRE 3 PLUS SENSOR) MISC Change sensor every 15 days. 6 each 3   cycloSPORINE  (RESTASIS ) 0.05 % ophthalmic emulsion Place 1 drop into both eyes 2 (two) times daily. 180 each 99   estradiol  (ESTRACE ) 0.1 MG/GM vaginal cream Place 1 Applicatorful vaginally 3 (three) times a week. 42.5 g 1   FLUoxetine  (PROZAC ) 20 MG capsule Take 1 capsule (20 mg total) by mouth daily. 30 capsule 2   FLUoxetine  (PROZAC ) 40 MG capsule Take 1 capsule (40 mg total) by mouth daily. 30 capsule 2   fluticasone  (FLONASE ) 50 MCG/ACT nasal spray Place 1 spray into both nostrils daily. 16 g 3   Fluticasone -Umeclidin-Vilant (TRELEGY ELLIPTA ) 100-62.5-25 MCG/ACT AEPB Inhale 1 puff into the lungs.      Galcanezumab -gnlm (EMGALITY ) 120 MG/ML SOSY Inject 1 mL into the skin every 30 (thirty) days. 1 mL 11   Immune Globulin ,  Human,-klhw (XEMBIFY ) 10 GM/50ML SOLN Inject 10 g into the skin every 7 (seven) days. 200 mL 3   insulin  glargine (LANTUS ) 100 UNIT/ML Solostar Pen Inject 40 Units into the skin at bedtime. 36 mL 3   ipratropium-albuterol  (DUONEB) 0.5-2.5 (3) MG/3ML SOLN Take 3 mLs by nebulization 2 (two) times daily as needed. 90 mL 2   lamoTRIgine  (LAMICTAL ) 25 MG tablet Take 1 tablet (25 mg total) by mouth 2 (two) times daily. 60 tablet 2   Lancets (ONETOUCH ULTRASOFT) lancets Use to check blood sugar once daily 100 each 3   Latanoprostene Bunod  (VYZULTA ) 0.024 % SOLN Place 1 drop into both eyes at bedtime as directed 7.5 mL 3   lubiprostone  (AMITIZA ) 24 MCG capsule Take 1 capsule (24 mcg total) by mouth 2 (two) times daily. 60 capsule 3   omeprazole  (PRILOSEC) 20 MG capsule Take 1 capsule (20 mg total) by mouth 2 (two) times daily. 60 capsule 2   ondansetron  (ZOFRAN -ODT) 8 MG disintegrating tablet Dissolve 1 tablet (8 mg total) by mouth every 8 (eight) hours as needed for nausea or vomiting. 20 tablet 6   ONETOUCH VERIO test strip Use to check glucose once daily. 100 each 3   OVER THE COUNTER MEDICATION Magnessium 500 mg at night     pregabalin  (LYRICA ) 200 MG capsule Take 1 capsule (200 mg total) by mouth 3 (three) times daily. 90 capsule 5   rosuvastatin  (CRESTOR ) 40 MG tablet Take 1 tablet (40 mg total) by mouth at bedtime. 90 tablet 1   tiZANidine  (ZANAFLEX ) 4 MG tablet Take 1 tablet (4 mg total) by mouth every 12 (twelve) hours as needed. 60 tablet 11   traMADol  (ULTRAM ) 50 MG tablet Take 1 tablet (50 mg total) by mouth every 6 (six) hours as needed. 15 tablet 0   traZODone  (DESYREL ) 50 MG tablet Take 1 tablet (50 mg total) by mouth at bedtime. 30 tablet 2   UNABLE TO FIND 1 each by Does not apply route daily. Med Name: CPAP SUPPLIES Full face mask Cushion for mask -Medium ResMed 1  each 11   VITAMIN D  PO Take 5,000 Units by mouth daily.     zinc gluconate 50 MG tablet Take 50 mg by mouth daily.     zolmitriptan  (ZOMIG ) 5 MG nasal solution Administer 5 mg into affected nostril(s) as needed (1 nasal spray at onset of severe headache; may repeat once after 2 hours if headache persists; max of 2 doses in 24 hours or 4/week.). 12 each 5   zonisamide  (ZONEGRAN ) 50 MG capsule Take 1 capsule by mouth at bedtime for 5 days then increase to 2 capsules at bedtime thereafter. 60 capsule 3   dapagliflozin  propanediol (FARXIGA ) 10 MG TABS tablet Take 1 tablet (10 mg total) by mouth in the morning. 30 tablet 11   Latanoprostene Bunod  (VYZULTA ) 0.024 % SOLN Place 1 drop into both eyes at bedtime as directed. 15 mL 3   Latanoprostene Bunod  (VYZULTA ) 0.024 % SOLN Place 1 drop into both eyes at bedtime as directed. 15 mL 3   No current facility-administered medications for this visit.    Allergies as of 09/19/2024 - Review Complete 09/19/2024  Allergen Reaction Noted   Dust mite extract Shortness Of Breath, Other (See Comments), and Cough 09/17/2014   Gramineae pollens Shortness Of Breath 09/17/2014   Misc. sulfonamide containing compounds Anaphylaxis 03/28/2024   Molds & smuts Shortness Of Breath 09/17/2014   Other Anaphylaxis, Other (See Comments), and Shortness Of Breath 09/17/2014  Statins Anaphylaxis 09/26/2014   Sulfa antibiotics Anaphylaxis and Other (See Comments) 09/17/2014   Sulfamethoxazole-trimethoprim Anaphylaxis and Other (See Comments) 09/21/2022   Trichophyton Shortness Of Breath 09/17/2014   Carbamazepine  Rash, Dermatitis, and Other (See Comments) 06/03/2015   Gluten meal Itching and Other (See Comments) 09/17/2014   Quetiapine Other (See Comments) 03/01/2021   Quetiapine fumarate Other (See Comments) 11/22/2022    Family History  Problem Relation Age of Onset   Fibromyalgia Mother    Diabetes type II Mother    Depression Mother    Alzheimer's disease Mother     GER disease Mother    Heart disease Mother    Paranoid behavior Mother    Dementia Mother    Heart attack Father 3       MI x5   Stroke Father        CVA x7   Asthma Father    Brain cancer Father    Seizures Father    Anxiety disorder Sister    OCD Sister    Sexual abuse Sister    Physical abuse Sister    Anxiety disorder Sister    Fibromyalgia Sister    Other Sister        Temporomandibular Joint   ADD / ADHD Daughter    ADD / ADHD Daughter    Alcohol abuse Neg Hx    Drug abuse Neg Hx    Schizophrenia Neg Hx     Social History   Socioeconomic History   Marital status: Married    Spouse name: Not on file   Number of children: 2   Years of education: College   Highest education level: Associate degree: academic program  Occupational History   Occupation: Chiropractor: UNEMPLOYED    Comment: Now disabled due to bipolar and fibromyalgia  Tobacco Use   Smoking status: Former    Current packs/day: 0.00    Types: Cigarettes    Quit date: 11/04/2010    Years since quitting: 13.8    Passive exposure: Past   Smokeless tobacco: Never  Vaping Use   Vaping status: Never Used  Substance and Sexual Activity   Alcohol use: Yes    Comment: one mixed drink per month   Drug use: No   Sexual activity: Yes    Birth control/protection: Surgical  Other Topics Concern   Not on file  Social History Narrative   Married   Lives with spouse and daughter   Right handed.   Caffeine use: 2 cups coffee in morning and 2 cups tea during the day    Social Drivers of Health   Financial Resource Strain: Low Risk  (05/23/2024)   Overall Financial Resource Strain (CARDIA)    Difficulty of Paying Living Expenses: Not very hard  Food Insecurity: No Food Insecurity (05/23/2024)   Hunger Vital Sign    Worried About Running Out of Food in the Last Year: Never true    Ran Out of Food in the Last Year: Never true  Transportation Needs: No Transportation Needs (05/23/2024)    PRAPARE - Administrator, Civil Service (Medical): No    Lack of Transportation (Non-Medical): No  Physical Activity: Insufficiently Active (05/23/2024)   Exercise Vital Sign    Days of Exercise per Week: 1 day    Minutes of Exercise per Session: 10 min  Stress: Stress Concern Present (05/23/2024)   [employer] of Occupational Health - Occupational Stress Questionnaire    Feeling of Stress: Rather  much  Social Connections: Moderately Isolated (05/23/2024)   Social Connection and Isolation Panel    Frequency of Communication with Friends and Family: Twice a week    Frequency of Social Gatherings with Friends and Family: More than three times a week    Attends Religious Services: Never    Database Administrator or Organizations: No    Attends Engineer, Structural: Not on file    Marital Status: Married  Intimate Partner Violence: Unknown (03/10/2022)   Received from [facility]   HITS    Physically Hurt: Not on file    Insult or Talk Down To: Not on file    Threaten Physical Harm: Not on file    Scream or Curse: Not on file     Review of Systems   Gen: Denies any fever, chills, fatigue, weight loss, lack of appetite.  CV: Denies chest pain, heart palpitations, peripheral edema, syncope.  Resp: Denies shortness of breath at rest or with exertion. Denies wheezing or cough.  GI: Denies dysphagia or odynophagia. Denies jaundice, hematemesis, fecal incontinence. GU : Denies urinary burning, urinary frequency, urinary hesitancy MS: Denies joint pain, muscle weakness, cramps, or limitation of movement.  Derm: Denies rash, itching, dry skin Psych: Denies depression, anxiety, memory loss, and confusion Heme: Denies bruising, bleeding, and enlarged lymph nodes.   Physical Exam   BP 129/83 (BP Location: Right Arm, Patient Position: Sitting, Cuff Size: Large)   Pulse (!) 101   Temp 98.4 F (36.9 C) (Oral)   Ht 5' 11 (1.803 m)   Wt 220 lb 9.6 oz (100.1 kg)    LMP 01/04/2018   SpO2 97%   BMI 30.77 kg/m  General:   Alert and oriented. Pleasant and cooperative. Well-nourished and well-developed.  Head:  Normocephalic and atraumatic. Eyes:  Without icterus Abdomen:  +BS, soft, mild TTP periumbilically and non-distended. No HSM noted. No guarding or rebound. No masses appreciated.  Rectal:  Deferred  Msk:  Symmetrical without gross deformities. Normal posture. Extremities:  Without edema. Neurologic:  Alert and  oriented x4;  grossly normal neurologically. Skin:  Intact without significant lesions or rashes. Psych:  Alert and cooperative. Normal mood and affect.  Lab Results  Component Value Date   ALT 50 (H) 07/09/2024   AST 36 07/09/2024   ALKPHOS 99 07/09/2024   BILITOT 0.4 07/09/2024   Lab Results  Component Value Date   NA 139 08/02/2024   CL 104 08/02/2024   K 4.2 08/02/2024   CO2 23 08/02/2024   BUN 14 08/02/2024   CREATININE 1.20 (H) 09/23/2024   GFRNONAA 58 (L) 08/02/2024   CALCIUM  9.6 08/02/2024   ALBUMIN 5.1 (H) 07/09/2024   GLUCOSE 96 08/02/2024   Lab Results  Component Value Date   WBC 7.4 08/02/2024   HGB 15.0 08/02/2024   HCT 45.0 08/02/2024   MCV 95.3 08/02/2024   PLT 271 08/02/2024       Assessment   LADY WISHAM is a 58 y.o. 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  Weight loss: 9 lbs since May 2025, upcoming TSH check in November. Significant increased stressors. Colonoscopy and EGD recently on file. Unclear etiology, suspecting stress possibly thyroid  abnormalities. Will arrange CT in near future.  Diarrhea: previously constipation and had been on Amitiza  24 mcg BID. Stop Amitiza  for  now and reset. If persistent diarrhea, needs stool studies.   Multiple polyps: surveillance due in 2028.   EGD due in 2027 for gastric mapping due to metaplasia.       PLAN    Hold Amitiza  for now, if persistent loose stool, needs stool studies CT in near future 2 month follow-up regardless   Michelle MICAEL Stager, PhD, ANP-BC [facility]   I have reviewed the note and agree with the APP's assessment as described in this progress note  Toribio Fortune, MD Gastroenterology and Hepatology [facility]

## 2024-09-23 ENCOUNTER — Ambulatory Visit (HOSPITAL_COMMUNITY)
Admission: RE | Admit: 2024-09-23 | Discharge: 2024-09-23 | Disposition: A | Discharge status: Home / Self Care | Source: Ambulatory Visit | Attending: Gastroenterology | Admitting: Gastroenterology

## 2024-09-23 ENCOUNTER — Other Ambulatory Visit: Payer: Self-pay

## 2024-09-23 DIAGNOSIS — Z9049 Acquired absence of other specified parts of digestive tract: Secondary | ICD-10-CM | POA: Diagnosis not present

## 2024-09-23 DIAGNOSIS — R102 Pelvic and perineal pain unspecified side: Secondary | ICD-10-CM | POA: Diagnosis not present

## 2024-09-23 DIAGNOSIS — R197 Diarrhea, unspecified: Secondary | ICD-10-CM | POA: Insufficient documentation

## 2024-09-23 DIAGNOSIS — R634 Abnormal weight loss: Secondary | ICD-10-CM | POA: Insufficient documentation

## 2024-09-23 DIAGNOSIS — N200 Calculus of kidney: Secondary | ICD-10-CM | POA: Diagnosis not present

## 2024-09-23 LAB — POCT I-STAT CREATININE: Creatinine, Ser: 1.2 mg/dL — ABNORMAL HIGH (ref 0.44–1.00)

## 2024-09-23 MED ORDER — IOHEXOL 300 MG/ML  SOLN
100.0000 mL | Freq: Once | INTRAMUSCULAR | Status: AC | PRN
  Administered 2024-09-23: 100 mL via INTRAVENOUS

## 2024-09-24 ENCOUNTER — Ambulatory Visit: Payer: Self-pay | Admitting: Gastroenterology

## 2024-09-24 ENCOUNTER — Ambulatory Visit

## 2024-09-24 VITALS — Ht 71.0 in | Wt 220.0 lb

## 2024-09-24 DIAGNOSIS — F411 Generalized anxiety disorder: Secondary | ICD-10-CM | POA: Diagnosis not present

## 2024-09-24 DIAGNOSIS — F3181 Bipolar II disorder: Secondary | ICD-10-CM | POA: Diagnosis not present

## 2024-09-24 DIAGNOSIS — Z0000 Encounter for general adult medical examination without abnormal findings: Secondary | ICD-10-CM | POA: Diagnosis not present

## 2024-09-24 NOTE — Progress Notes (Signed)
 Subjective:   Michelle Clements is a 58 y.o. who presents for a Medicare Wellness preventive visit.  As a reminder, Annual Wellness Visits don't include a physical exam, and some assessments may be limited, especially if this visit is performed virtually. We may recommend an in-person follow-up visit with your provider if needed.  Visit Complete: Virtual I connected with  [name] on 09/24/24 by a audio enabled telemedicine application and verified that I am speaking with the correct person using two identifiers.  Patient Location: Home  Provider Location: Home Office  I discussed the limitations of evaluation and management by telemedicine. The patient expressed understanding and agreed to proceed.  Vital Signs: Because this visit was a virtual/telehealth visit, some criteria may be missing or patient reported. Any vitals not documented were not able to be obtained and vitals that have been documented are patient reported.  VideoDeclined- This patient declined Librarian, academic. Therefore the visit was completed with audio only.  Persons Participating in Visit: Patient.  AWV Questionnaire: No: Patient Medicare AWV questionnaire was not completed prior to this visit.  Cardiac Risk Factors include: advanced age (>47men, >5 women);diabetes mellitus;dyslipidemia;hypertension;obesity (BMI >30kg/m2);sedentary lifestyle     Objective:    Today's Vitals   09/24/24 1022  Weight: 220 lb (99.8 kg)  Height: 5' 11 (1.803 m)  PainSc: 7   PainLoc: Neck   Body mass index is 30.68 kg/m.     09/24/2024   10:18 AM 07/30/2024    8:55 AM 07/24/2024    2:25 PM 07/18/2024    9:49 AM 05/07/2024    9:06 AM 01/22/2024   10:03 AM 10/02/2023    9:16 AM  Advanced Directives  Does Patient Have a Medical Advance Directive? No No No No No No No  Would patient like information on creating a medical advance directive? No - Patient declined No - Patient declined No - Patient  declined No - Patient declined No - Patient declined No - Patient declined No - Patient declined    Current Medications (verified) Outpatient Encounter Medications as of 09/24/2024  Medication Sig   albuterol  (VENTOLIN  HFA) 108 (90 Base) MCG/ACT inhaler Inhale 2 puffs into the lungs every 6 (six) hours as needed for wheezing and/or shortness of breath.   aspirin EC 81 MG tablet Take 1 tablet every day by oral route.   azelastine  (ASTELIN ) 0.1 % nasal spray Place 1 spray into both nostrils 2 (two) times daily.   Blood Glucose Monitoring Suppl (ONETOUCH VERIO FLEX SYSTEM) w/Device KIT Use as directed to check blood glucose twice daily, before breakfast and before bed.   Blood Glucose Monitoring Suppl (ONETOUCH VERIO) w/Device KIT Use to check glucose once daily   buPROPion  (WELLBUTRIN  XL) 150 MG 24 hr tablet Take 1 tablet (150 mg total) by mouth every morning.   cetirizine  (ZYRTEC ) 10 MG tablet Take 10 mg by mouth at bedtime.   clonazePAM  (KLONOPIN ) 0.5 MG tablet Take 1 tablet (0.5 mg total) by mouth at bedtime.   Coenzyme Q10 (COQ10 PO) Take by mouth. One daily   Continuous Glucose Sensor (FREESTYLE LIBRE 3 PLUS SENSOR) MISC Change sensor every 15 days.   cycloSPORINE  (RESTASIS ) 0.05 % ophthalmic emulsion Place 1 drop into both eyes 2 (two) times daily.   dapagliflozin  propanediol (FARXIGA ) 10 MG TABS tablet Take 1 tablet (10 mg total) by mouth in the morning.   estradiol  (ESTRACE ) 0.1 MG/GM vaginal cream Place 1 Applicatorful vaginally 3 (three) times a week.  FLUoxetine  (PROZAC ) 20 MG capsule Take 1 capsule (20 mg total) by mouth daily.   FLUoxetine  (PROZAC ) 40 MG capsule Take 1 capsule (40 mg total) by mouth daily.   fluticasone  (FLONASE ) 50 MCG/ACT nasal spray Place 1 spray into both nostrils daily.   Fluticasone -Umeclidin-Vilant (TRELEGY ELLIPTA ) 100-62.5-25 MCG/ACT AEPB Inhale 1 puff into the lungs.   Galcanezumab -gnlm (EMGALITY ) 120 MG/ML SOSY Inject 1 mL into the skin every 30 (thirty)  days.   Immune Globulin , Human,-klhw (XEMBIFY ) 10 GM/50ML SOLN Inject 10 g into the skin every 7 (seven) days.   insulin  glargine (LANTUS ) 100 UNIT/ML Solostar Pen Inject 40 Units into the skin at bedtime. (Patient taking differently: Inject 30 Units into the skin at bedtime.)   ipratropium-albuterol  (DUONEB) 0.5-2.5 (3) MG/3ML SOLN Take 3 mLs by nebulization 2 (two) times daily as needed.   lamoTRIgine  (LAMICTAL ) 25 MG tablet Take 1 tablet (25 mg total) by mouth 2 (two) times daily.   Lancets (ONETOUCH ULTRASOFT) lancets Use to check blood sugar once daily   Latanoprostene Bunod  (VYZULTA ) 0.024 % SOLN Place 1 drop into both eyes at bedtime as directed   Latanoprostene Bunod  (VYZULTA ) 0.024 % SOLN Place 1 drop into both eyes at bedtime as directed.   Latanoprostene Bunod  (VYZULTA ) 0.024 % SOLN Place 1 drop into both eyes at bedtime as directed.   lubiprostone  (AMITIZA ) 24 MCG capsule Take 1 capsule (24 mcg total) by mouth 2 (two) times daily.   omeprazole  (PRILOSEC) 20 MG capsule Take 1 capsule (20 mg total) by mouth 2 (two) times daily.   ondansetron  (ZOFRAN -ODT) 8 MG disintegrating tablet Dissolve 1 tablet (8 mg total) by mouth every 8 (eight) hours as needed for nausea or vomiting.   ONETOUCH VERIO test strip Use to check glucose once daily.   OVER THE COUNTER MEDICATION Magnessium 500 mg at night   pregabalin  (LYRICA ) 200 MG capsule Take 1 capsule (200 mg total) by mouth 3 (three) times daily.   rosuvastatin  (CRESTOR ) 40 MG tablet Take 1 tablet (40 mg total) by mouth at bedtime.   tiZANidine  (ZANAFLEX ) 4 MG tablet Take 1 tablet (4 mg total) by mouth every 12 (twelve) hours as needed.   traMADol  (ULTRAM ) 50 MG tablet Take 1 tablet (50 mg total) by mouth every 6 (six) hours as needed.   traZODone  (DESYREL ) 50 MG tablet Take 1 tablet (50 mg total) by mouth at bedtime.   UNABLE TO FIND 1 each by Does not apply route daily. Med Name: CPAP SUPPLIES Full face mask Cushion for mask -Medium ResMed    VITAMIN D  PO Take 5,000 Units by mouth daily.   zinc gluconate 50 MG tablet Take 50 mg by mouth daily.   zolmitriptan  (ZOMIG ) 5 MG nasal solution Administer 5 mg into affected nostril(s) as needed (1 nasal spray at onset of severe headache; may repeat once after 2 hours if headache persists; max of 2 doses in 24 hours or 4/week.).   zonisamide  (ZONEGRAN ) 50 MG capsule Take 1 capsule by mouth at bedtime for 5 days then increase to 2 capsules at bedtime thereafter.   [DISCONTINUED] SUMAtriptan  (IMITREX ) 100 MG tablet Take 1 tablet by mouth as needed for migraine. If migraine continues, may repeat dose after 2 hours.   No facility-administered encounter medications on file as of 09/24/2024.    Allergies (verified) Dust mite extract, Gramineae pollens, Misc. sulfonamide containing compounds, Molds & smuts, Other, Statins, Sulfa antibiotics, Sulfamethoxazole-trimethoprim, Trichophyton, Carbamazepine , Gluten meal, Quetiapine, and Quetiapine fumarate   History: Past Medical  History:  Diagnosis Date   Allergic rhinitis    Anemia    Arthritis    Lower back and hips   Asthma    Bipolar disorder (HCC)    Bronchitis 05/2024   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HCC)    Constipation    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HCC)    Depression    Essential hypertension, benign    Fibromyalgia    GERD (gastroesophageal reflux disease)    H/O sleep apnea    Hip pain, left    History of palpitations    Negative Holter monitor   History of pneumonia 12/05/1986   Hyperlipidemia    IBS (irritable bowel syndrome)    Nausea and vomiting 02/16/2024   Neck pain    Obstructive sleep apnea (adult) (pediatric) 04/12/2021   Poor short term memory    PTSD (post-traumatic stress disorder)    S/P Botox injection 11/04/2014   For migraine headaches   Type 2 diabetes mellitus (HCC)    Urgency of urination    Past Surgical History:  Procedure Laterality Date   CARPAL TUNNEL RELEASE Right 06/10/2013    Procedure: CARPAL TUNNEL RELEASE;  Surgeon: Darina MALVA Boehringer, MD;  Location: [facility];  Service: Neurosurgery;  Laterality: Right;  Right Carpal Tunnel Release    CHOLECYSTECTOMY     COLONOSCOPY N/A 07/30/2024   Procedure: COLONOSCOPY;  Surgeon: Eartha Angelia Sieving, MD;  Location: [facility];  Service: Gastroenterology;  Laterality: N/A;  930am, asa 3   DENTAL SURGERY  12/05/2014   ESOPHAGOGASTRODUODENOSCOPY (EGD) WITH PROPOFOL  N/A 02/16/2024   Procedure: ESOPHAGOGASTRODUODENOSCOPY (EGD) WITH PROPOFOL ;  Surgeon: Eartha Angelia Sieving, MD;  Location: [facility];  Service: Gastroenterology;  Laterality: N/A;  11:30am;ASA 1   mole exc     x 3   MUSCLE BIOPSY  12/05/2006   Right leg   POLYPECTOMY  02/16/2024   Procedure: POLYPECTOMY;  Surgeon: Eartha Angelia, Sieving, MD;  Location: [facility];  Service: Gastroenterology;;   HARLEY DILATION  02/16/2024   Procedure: EGD, WITH DILATION USING SAVARY-GILLIARD DILATOR OVER GUIDEWIRE;  Surgeon: Eartha Angelia, Sieving, MD;  Location: [facility];  Service: Gastroenterology;;   TRIGGER FINGER RELEASE Right 03/2024   thumb   TUBAL LIGATION     Family History  Problem Relation Age of Onset   Fibromyalgia Mother    Diabetes type II Mother    Depression Mother    Alzheimer's disease Mother    GER disease Mother    Heart disease Mother    Paranoid behavior Mother    Dementia Mother    Heart attack Father 52       MI x5   Stroke Father        CVA x7   Asthma Father    Brain cancer Father    Seizures Father    Anxiety disorder Sister    OCD Sister    Sexual abuse Sister    Physical abuse Sister    Anxiety disorder Sister    Fibromyalgia Sister    Other Sister        Temporomandibular Joint   ADD / ADHD Daughter    ADD / ADHD Daughter    Alcohol abuse Neg Hx    Drug abuse Neg Hx    Schizophrenia Neg Hx    Social History   Socioeconomic History   Marital status: Married    Spouse name: Not on file    Number of children: 2   Years of education:  College   Highest education level: Associate degree: academic program  Occupational History   Occupation: Chiropractor: UNEMPLOYED    Comment: Now disabled due to bipolar and fibromyalgia  Tobacco Use   Smoking status: Former    Current packs/day: 0.00    Types: Cigarettes    Quit date: 11/04/2010    Years since quitting: 13.8    Passive exposure: Past   Smokeless tobacco: Never  Vaping Use   Vaping status: Never Used  Substance and Sexual Activity   Alcohol use: Yes    Comment: one mixed drink per month   Drug use: No   Sexual activity: Yes    Birth control/protection: Surgical  Other Topics Concern   Not on file  Social History Narrative   Married   Lives with spouse and daughter   Right handed.   Caffeine use: 2 cups coffee in morning and 2 cups tea during the day    Social Drivers of Health   Financial Resource Strain: Low Risk  (09/24/2024)   Overall Financial Resource Strain (CARDIA)    Difficulty of Paying Living Expenses: Not hard at all  Food Insecurity: No Food Insecurity (09/24/2024)   Hunger Vital Sign    Worried About Running Out of Food in the Last Year: Never true    Ran Out of Food in the Last Year: Never true  Transportation Needs: No Transportation Needs (09/24/2024)   PRAPARE - Administrator, Civil Service (Medical): No    Lack of Transportation (Non-Medical): No  Physical Activity: Inactive (09/24/2024)   Exercise Vital Sign    Days of Exercise per Week: 0 days    Minutes of Exercise per Session: 0 min  Stress: Stress Concern Present (09/24/2024)   [employer] of Occupational Health - Occupational Stress Questionnaire    Feeling of Stress: To some extent  Social Connections: Socially Integrated (09/24/2024)   Social Connection and Isolation Panel    Frequency of Communication with Friends and Family: More than three times a week    Frequency of Social Gatherings with  Friends and Family: Twice a week    Attends Religious Services: More than 4 times per year    Active Member of [employer] or Organizations: Yes    Attends Banker Meetings: Never    Marital Status: Married    Tobacco Counseling Counseling given: Yes    Clinical Intake:  Pre-visit preparation completed: Yes  Pain : No/denies pain Pain Score: 7      BMI - recorded: 30.68 Nutritional Status: BMI > 30  Obese Nutritional Risks: None Diabetes: Yes CBG done?: No (telehealth visit) Did pt. bring in CBG monitor from home?: No  Lab Results  Component Value Date   HGBA1C 5.8 (A) 06/14/2024   HGBA1C 6.2 (A) 11/07/2023   HGBA1C 6.5 (A) 07/05/2023     How often do you need to have someone help you when you read instructions, pamphlets, or other written materials from your doctor or pharmacy?: 1 - Never  Interpreter Needed?: No  Information entered by :: Vishnu Moeller W CMA (AAMA)   Activities of Daily Living     09/24/2024   10:32 AM 07/24/2024    1:52 PM  In your present state of health, do you have any difficulty performing the following activities:  Hearing? 0 0  Vision? 0 0  Difficulty concentrating or making decisions? 0 0  Walking or climbing stairs? 0   Dressing or bathing? 0  Doing errands, shopping? 0 0  Preparing Food and eating ? N   Using the Toilet? N   In the past six months, have you accidently leaked urine? N   Do you have problems with loss of bowel control? N   Managing your Medications? N   Managing your Finances? N   Housekeeping or managing your Housekeeping? N     Patient Care Team: Bevely Doffing, FNP as PCP - General (Family Medicine) Debera Jayson MATSU, MD as Consulting Physician (Cardiology) Rosemarie Eather RAMAN, MD as Consulting Physician (Neurology) Renae Augustin PARAS, PTA as Physical Therapy Assistant (Physical Therapy) Okey Barnie SAUNDERS, MD as Consulting Physician [facility]) Darlis Deatrice RAMAN, MD as Consulting Physician (Pain  Medicine)  I have updated your Care Teams any recent Medical Services you may have received from other providers in the past year.     Assessment:   This is a routine wellness examination for Syra.  Hearing/Vision screen Hearing Screening - Comments:: Patient denies any hearing difficulties.   Vision Screening - Comments:: Wears rx glasses - up to date with routine eye exams with  Dr. Darroll in [place] and Dr. Cyrus in [place]   Goals Addressed               This Visit's Progress     Work on getting stronger (pt-stated)          Depression Screen     09/24/2024   10:36 AM 07/18/2024    9:45 AM 05/23/2024    4:38 PM 03/08/2024    9:58 AM 01/25/2024    3:19 PM 12/05/2023   10:20 AM 11/04/2022    9:28 AM  PHQ 2/9 Scores  PHQ - 2 Score 2 0 0 4 2 2    PHQ- 9 Score 8  0 17 13 13       Information is confidential and restricted. Go to Review Flowsheets to unlock data.     Fall Risk     09/24/2024   10:25 AM 05/23/2024    4:38 PM 01/25/2024    3:18 PM 12/05/2023   10:20 AM 04/21/2021   11:24 AM  Fall Risk   Falls in the past year? 0 0 1 1 0  Number falls in past yr: 0 0 1 1   Injury with Fall? 0 0 1 1   Risk for fall due to : No Fall Risks No Fall Risks History of fall(s);Impaired balance/gait    Follow up Falls evaluation completed;Education provided;Falls prevention discussed Falls evaluation completed Falls evaluation completed;Education provided;Falls prevention discussed  Falls evaluation completed      Data saved with a previous flowsheet row definition    MEDICARE RISK AT HOME:  Medicare Risk at Home Any stairs in or around the home?: Yes If so, are there any without handrails?: No Home free of loose throw rugs in walkways, pet beds, electrical cords, etc?: Yes Adequate lighting in your home to reduce risk of falls?: Yes Life alert?: No Use of a cane, walker or w/c?: No Grab bars in the bathroom?: No Shower chair or bench in shower?: Yes Elevated  toilet seat or a handicapped toilet?: Yes  TIMED UP AND GO:  Was the test performed?  No  Cognitive Function: 6CIT completed    08/29/2019    1:07 PM  MMSE - Mini Mental State Exam  Orientation to time 5  Orientation to Place 5  Registration 3  Attention/ Calculation 3  Recall 3  Language- name 2 objects 2  Language- repeat 1  Language- follow 3 step command 3  Language- read & follow direction 1  Write a sentence 1  Copy design 0  Total score 27        09/24/2024   10:33 AM  6CIT Screen  What Year? 0 points  What month? 0 points  What time? 0 points  Count back from 20 0 points  Months in reverse 0 points  Repeat phrase 0 points  Total Score 0 points    Immunizations Immunization History  Administered Date(s) Administered   Fluzone Influenza virus vaccine,trivalent (IIV3), split virus 09/24/2009   Influenza Whole 10/03/2007   Influenza,inj,Quad PF,6+ Mos 10/03/2018, 09/01/2021   Influenza,inj,quad, With Preservative 09/26/2019   Influenza-Unspecified 09/18/2014, 09/23/2015, 09/04/2017   Novel Infuenza-h1n1-09 01/26/2009   Pneumococcal Polysaccharide-23 10/14/2012   Td 01/21/2019    Screening Tests Health Maintenance  Topic Date Due   COVID-19 Vaccine (1) Never done   HIV Screening  Never done   Hepatitis C Screening  Never done   Hepatitis B Vaccines 19-59 Average Risk (1 of 3 - 19+ 3-dose series) Never done   Zoster Vaccines- Shingrix (1 of 2) Never done   Pneumococcal Vaccine: 50+ Years (2 of 2 - PCV) 10/14/2013   Lung Cancer Screening  03/25/2018   OPHTHALMOLOGY EXAM  03/02/2023   Diabetic kidney evaluation - Urine ACR  07/15/2023   Cervical Cancer Screening (HPV/Pap Cotest)  10/08/2023   Mammogram  11/23/2023   Influenza Vaccine  07/05/2024   HEMOGLOBIN A1C  12/15/2024   FOOT EXAM  03/08/2025   Diabetic kidney evaluation - eGFR measurement  08/02/2025   Medicare Annual Wellness (AWV)  09/24/2025   Colonoscopy  07/31/2027   DTaP/Tdap/Td (2 -  Tdap) 01/21/2029   HPV VACCINES  Aged Out   Meningococcal B Vaccine  Aged Out    Health Maintenance Health Maintenance Due  Topic Date Due   COVID-19 Vaccine (1) Never done   HIV Screening  Never done   Hepatitis C Screening  Never done   Hepatitis B Vaccines 19-59 Average Risk (1 of 3 - 19+ 3-dose series) Never done   Zoster Vaccines- Shingrix (1 of 2) Never done   Pneumococcal Vaccine: 50+ Years (2 of 2 - PCV) 10/14/2013   Lung Cancer Screening  03/25/2018   OPHTHALMOLOGY EXAM  03/02/2023   Diabetic kidney evaluation - Urine ACR  07/15/2023   Cervical Cancer Screening (HPV/Pap Cotest)  10/08/2023   Mammogram  11/23/2023   Influenza Vaccine  07/05/2024   Health Maintenance Items Addressed: Discussed health maintenance items with patient. She is aware of recommended screenings. She is aware of recommended vaccines and where she can have those completed at.   Additional Screening:  Vision Screening: Recommended annual ophthalmology exams for early detection of glaucoma and other disorders of the eye. Would you like a referral to an eye doctor? No    Dental Screening: Recommended annual dental exams for proper oral hygiene  Community Resource Referral / Chronic Care Management: CRR required this visit?  No   CCM required this visit?  No   Plan:    I have personally reviewed and noted the following in the patient's chart:   Medical and social history Use of alcohol, tobacco or illicit drugs  Current medications and supplements including opioid prescriptions. Patient is currently taking opioid prescriptions. Information provided to patient regarding non-opioid alternatives. Patient advised to discuss non-opioid treatment plan with their provider. Functional ability and status Nutritional status Physical activity Advanced  directives List of other physicians Hospitalizations, surgeries, and ER visits in previous 12 months Vitals Screenings to include cognitive,  depression, and falls Referrals and appointments  In addition, I have reviewed and discussed with patient certain preventive protocols, quality metrics, and best practice recommendations. A written personalized care plan for preventive services as well as general preventive health recommendations were provided to patient.   Remi Rester, CMA   09/24/2024   After Visit Summary: (MyChart) Due to this being a telephonic visit, the after visit summary with patients personalized plan was offered to patient via MyChart   Notes: Nothing significant to report at this time.

## 2024-09-24 NOTE — Patient Instructions (Signed)
 [name],  Thank you for taking the time for your Medicare Wellness Visit. I appreciate your continued commitment to your health goals. Please review the care plan we discussed, and feel free to reach out if I can assist you further.  Medicare recommends these wellness visits once per year to help you and your care team stay ahead of potential health issues. These visits are designed to focus on prevention, allowing your provider to concentrate on managing your acute and chronic conditions during your regular appointments.  Please note that Annual Wellness Visits do not include a physical exam. Some assessments may be limited, especially if the visit was conducted virtually. If needed, we may recommend a separate in-person follow-up with your provider.  Ongoing Care  Seeing your primary care provider every 3 to 6 months helps us  monitor your health and provide consistent, personalized care.   Referrals   Mammogram at [facility] Call [phone] to schedule your screening No perfumes, lotions, or deodorants the day of your screening. You can schedule your mammogram through mychart!   Recommended Screenings:  Health Maintenance  Topic Date Due   COVID-19 Vaccine (1) Never done   HIV Screening  Never done   Hepatitis C Screening  Never done   Hepatitis B Vaccine (1 of 3 - 19+ 3-dose series) Never done   Zoster (Shingles) Vaccine (1 of 2) Never done   Pneumococcal Vaccine for age over 20 (2 of 2 - PCV) 10/14/2013   Screening for Lung Cancer  03/25/2018   Eye exam for diabetics  03/02/2023   Yearly kidney health urinalysis for diabetes  07/15/2023   Pap with HPV screening  10/08/2023   Flu Shot  07/05/2024   Breast Cancer Screening  11/22/2024   Hemoglobin A1C  12/15/2024   Complete foot exam   03/08/2025   Yearly kidney function blood test for diabetes  08/02/2025   Medicare Annual Wellness Visit  09/24/2025   Colon Cancer Screening  07/31/2027   DTaP/Tdap/Td vaccine (2 - Tdap)  01/21/2029   HPV Vaccine  Aged Out   Meningitis B Vaccine  Aged Out       09/24/2024   10:18 AM  Advanced Directives  Does Patient Have a Medical Advance Directive? No  Would patient like information on creating a medical advance directive? No - Patient declined    Advance Care Planning is important because it: Ensures you receive medical care that aligns with your values, goals, and preferences. Provides guidance to your family and loved ones, reducing the emotional burden of decision-making during critical moments.  Vision: Annual vision screenings are recommended for early detection of glaucoma, cataracts, and diabetic retinopathy. These exams can also reveal signs of chronic conditions such as diabetes and high blood pressure.  Dental: Annual dental screenings help detect early signs of oral cancer, gum disease, and other conditions linked to overall health, including heart disease and diabetes.  Please see the attached documents for additional preventive care recommendations.

## 2024-09-25 ENCOUNTER — Ambulatory Visit: Attending: Internal Medicine | Admitting: Internal Medicine

## 2024-09-25 ENCOUNTER — Encounter: Payer: Self-pay | Admitting: Internal Medicine

## 2024-09-25 VITALS — BP 111/66 | HR 71 | Temp 97.0°F | Resp 14 | Ht 71.0 in | Wt 223.3 lb

## 2024-09-25 DIAGNOSIS — R269 Unspecified abnormalities of gait and mobility: Secondary | ICD-10-CM

## 2024-09-25 DIAGNOSIS — D751 Secondary polycythemia: Secondary | ICD-10-CM | POA: Diagnosis not present

## 2024-09-25 DIAGNOSIS — M797 Fibromyalgia: Secondary | ICD-10-CM | POA: Diagnosis not present

## 2024-09-25 DIAGNOSIS — R7689 Other specified abnormal immunological findings in serum: Secondary | ICD-10-CM

## 2024-09-25 DIAGNOSIS — M6281 Muscle weakness (generalized): Secondary | ICD-10-CM | POA: Diagnosis not present

## 2024-09-25 DIAGNOSIS — D801 Nonfamilial hypogammaglobulinemia: Secondary | ICD-10-CM

## 2024-09-25 DIAGNOSIS — D839 Common variable immunodeficiency, unspecified: Secondary | ICD-10-CM | POA: Diagnosis not present

## 2024-09-25 NOTE — Progress Notes (Signed)
 Office Visit Note  Patient: Michelle Clements             Date of Birth: 1966/09/09           MRN: [id number]             PCP: Bevely Doffing, FNP Referring: Bevely Doffing, FNP Visit Date: 09/25/2024   Subjective:  Other (Patient states she did a DNA sequencing and she has a couple things she would like to ask about. )   Discussed the use of AI scribe software for clinical note transcription with the patient, who gave verbal consent to proceed.  History of Present Illness   Michelle Clements is a 58 year old female who presents with concerns about genetic testing results and musculoskeletal symptoms.  She is concerned about genetic testing results that revealed abnormalities in the collagen 6 A1 gene, associated with [place] myopathy, and another variant associated with ALS or spinal muscle atrophy. She initially pursued genetic testing due to suspicions of Ehlers-Danlos syndrome, but no particular findings supported this diagnosis.  She experiences significant musculoskeletal issues, particularly in the shoulder and pelvic girdles, describing her shoulders as being able to 'pop them in and out.' She has a history of muscle weakness and decreased muscle mass in certain areas. She reports that the only neurologist she has seen was for her headaches and is unsure if her muscle symptoms were specifically evaluated by a neurologist. She recalls undergoing electromyography (EMG) testing approximately five to ten years ago, possibly conducted by a previous pain management doctor, but details are unclear.  She has a history of elevated hemoglobin and hematocrit levels, which have been a concern for her care team at the cancer center managing her immune deficiency. Hereditary hemochromatosis is a potential concern due to genetic findings and elevated hemoglobin levels, although she is unsure when her iron levels were last checked.  She experiences chronic musculoskeletal pain and stiffness, particularly  in the neck and shoulders, for which she underwent physical therapy a few years ago. No circulation problems in colder weather and consistent muscle stiffness over the years.       Previous HPI 05/31/24 Michelle Clements is a 58 year old female with fibromyalgia and CVID who presents with worsening joint and muscle pain with abnormal lab results including a positive ANA.   She has a long-standing history of fibromyalgia, diagnosed at age 38, characterized by body aches and pains. Over the past few years, she has noticed significant muscle wasting, particularly in her arms and legs, and a decrease in strength. Joint pain has worsened, affecting her hands, elbows, shoulders, neck, spine, hips, knees, and ankles. Her thumb knuckle is described as 'horrible' despite a previous trigger release surgery. She experiences difficulty gripping and holding objects, and her elbows are sore on the inside. Her feet have a burning, swollen sensation, attributed to neuropathy.   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 She recently experienced a severe episode of bronchitis with asthma exacerbation, treated with antibiotics and a Medrol  pack, which she completed two days ago. She reports occasional colds but no other recent serious infections.   Her medication regimen includes Lyrica  for fibromyalgia pain and fluoxetine , which she has been on for several years. She does not take maintenance steroids but recently completed a course of methylprednisolone  for bronchitis and asthma.   No new skin rashes,  abnormal bleeding, or significant lymph node swelling. She experiences poor sleep, waking frequently despite taking over-the-counter melatonin and valerian root supplements. She reports a change from chronic constipation to severe diarrhea recently.   Her social history includes a diet she  describes as 'pretty good,' with a guilty pleasure of kettle corn popcorn. She drinks a lot of water and eats salmon three times a week.    No raynaud's symptoms. No photosensitive skin eruptions. No history of blood clots or abnormal bleeding.      Labs reviewed ANA 1:80 DFS RNP 2.8 Rest 11 Ab panel neg   Review of Systems  Constitutional:  Positive for fatigue.  HENT:  Positive for mouth sores and mouth dryness.   Eyes:  Positive for dryness.  Respiratory:  Positive for shortness of breath.   Cardiovascular:  Positive for chest pain and palpitations.  Gastrointestinal:  Positive for constipation and diarrhea. Negative for blood in stool.  Endocrine: Positive for increased urination.  Genitourinary:  Negative for involuntary urination.  Musculoskeletal:  Positive for joint pain, gait problem, joint pain, myalgias, muscle weakness, morning stiffness, muscle tenderness and myalgias. Negative for joint swelling.  Skin:  Positive for color change and sensitivity to sunlight. Negative for rash and hair loss.  Allergic/Immunologic: Positive for susceptible to infections.  Neurological:  Positive for dizziness and headaches.  Hematological:  Negative for swollen glands.  Psychiatric/Behavioral:  Positive for depressed mood and sleep disturbance. The patient is nervous/anxious.     PMFS History:  Patient Active Problem List   Diagnosis Date Noted   Muscle weakness 09/25/2024   Polycythemia 09/25/2024   Loss of weight 09/19/2024   Connective tissue disease 07/19/2024   Positive ANA (antinuclear antibody) 05/31/2024   Malodorous urine 04/16/2024   Right flank pain 04/16/2024   Breast cancer screening by mammogram 03/08/2024   Right-sided chest wall pain 03/08/2024   Nausea and vomiting 02/16/2024   Asthma exacerbation, mild 01/25/2024   Choking 01/08/2024   Dysphagia 01/08/2024   Incomplete defecation 01/08/2024   Chronic pain syndrome 12/17/2023   Chronic back pain 12/17/2023    Anxiety and depression 12/17/2023   Vaginal atrophy 12/17/2023   Osteoarthritis of sacroiliac region 12/05/2023   Hardening of the aorta (main artery of the heart) 12/04/2023   Intractable chronic migraine without aura 07/25/2023   Recurrent falls 06/27/2023   Abnormal gait 03/22/2023   Osteoarthritis 02/03/2023   Restless legs 02/03/2023   Hyperkalemia 11/21/2022   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) 11/21/2022   Multiple joint pain 11/09/2022   Insomnia 07/19/2022   Dysphonia 04/06/2022   Elevated blood-pressure reading without diagnosis of hypertension 12/31/2021   Galactorrhea not associated with childbirth 10/13/2021   Major depressive disorder, recurrent episode, severe (HCC) 08/27/2021   Suicidal overdose, subsequent encounter 08/11/2021   Chronic kidney disease, stage 3b (HCC) 07/12/2021   Type 2 diabetes mellitus with diabetic chronic kidney disease (HCC) 07/12/2021   Fatigue 07/12/2021   Increased frequency of urination 06/02/2021   Moderate persistent asthma 05/05/2021   Memory impairment 05/05/2021   Sleep terror disorder 04/30/2021   Non-alcoholic fatty liver disease 04/30/2021   Hypercalcemia 04/30/2021   Generalized anxiety disorder 04/30/2021   Dysuria 04/30/2021   Iron deficiency anemia 04/12/2021   Immunodeficiency disorder 04/12/2021   Hypothyroidism 04/12/2021   Obstructive sleep apnea (adult) (pediatric) 04/12/2021   Obstructive sleep apnea syndrome 04/12/2021   Primary osteoarthritis of left hip 09/25/2018   Trochanteric bursitis, left hip 09/25/2018  Cervical spondylosis with radiculopathy 05/31/2016   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   Migraine 10/28/2014   Neck pain of over 3 months duration 10/28/2014   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   Headache 11/06/2013   Cervicalgia 11/06/2013   Acute confusional state 11/06/2013   Carpal tunnel syndrome 05/17/2013   Neuropathy of upper extremity 04/23/2013    Change in bowel habits 06/30/2009   Vitamin D  deficiency 02/13/2009   Morbid obesity (HCC) 02/13/2009   BIPOLAR AFFECTIVE DISORDER 11/19/2007   Essential hypertension, benign 11/16/2007   Mixed hyperlipidemia 10/03/2007   Hx of tobacco use, presenting hazards to health 10/03/2007   DEPRESSION 10/03/2007   IBS 10/03/2007   Fibromyalgia 10/03/2007   Palpitations 10/03/2007    Past Medical History:  Diagnosis Date   Allergic rhinitis    Anemia    Arthritis    Lower back and hips   Asthma    Bipolar disorder (HCC)    Bronchitis 05/2024   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HCC)    Constipation    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HCC)    Depression    Essential hypertension, benign    Fibromyalgia    GERD (gastroesophageal reflux disease)    H/O sleep apnea    Hip pain, left    History of palpitations    Negative Holter monitor   History of pneumonia 12/05/1986   Hyperlipidemia    IBS (irritable bowel syndrome)    Nausea and vomiting 02/16/2024   Neck pain    Obstructive sleep apnea (adult) (pediatric) 04/12/2021   Poor short term memory    PTSD (post-traumatic stress disorder)    S/P Botox injection 11/04/2014   For migraine headaches   Type 2 diabetes mellitus (HCC)    Urgency of urination     Family History  Problem Relation Age of Onset   Fibromyalgia Mother    Diabetes type II Mother    Depression Mother    Alzheimer's disease Mother    GER disease Mother    Heart disease Mother    Paranoid behavior Mother    Dementia Mother    Heart attack Father 4       MI x5   Stroke Father        CVA x7   Asthma Father    Brain cancer Father    Seizures Father    Anxiety disorder Sister    OCD Sister    Sexual abuse Sister    Physical abuse Sister    Anxiety disorder Sister    Fibromyalgia Sister    Other Sister        Temporomandibular Joint   ADD / ADHD Daughter    ADD / ADHD Daughter    Alcohol abuse Neg Hx    Drug abuse Neg Hx    Schizophrenia Neg  Hx    Past Surgical History:  Procedure Laterality Date   CARPAL TUNNEL RELEASE Right 06/10/2013   Procedure: CARPAL TUNNEL RELEASE;  Surgeon: Darina MALVA Boehringer, MD;  Location: [facility];  Service: Neurosurgery;  Laterality: Right;  Right Carpal Tunnel Release    CHOLECYSTECTOMY     COLONOSCOPY N/A 07/30/2024   Procedure: COLONOSCOPY;  Surgeon: Eartha Angelia Sieving, MD;  Location: [facility];  Service: Gastroenterology;  Laterality: N/A;  930am, asa 3   DENTAL SURGERY  12/05/2014   ESOPHAGOGASTRODUODENOSCOPY (EGD) WITH PROPOFOL  N/A 02/16/2024   Procedure: ESOPHAGOGASTRODUODENOSCOPY (EGD) WITH PROPOFOL ;  Surgeon: Eartha Angelia Sieving, MD;  Location: [facility];  Service: Gastroenterology;  Laterality:  N/A;  11:30am;ASA 1   mole exc     x 3   MUSCLE BIOPSY  12/05/2006   Right leg   POLYPECTOMY  02/16/2024   Procedure: POLYPECTOMY;  Surgeon: Eartha Angelia Sieving, MD;  Location: [facility];  Service: Gastroenterology;;   HARLEY DILATION  02/16/2024   Procedure: EGD, WITH DILATION USING SAVARY-GILLIARD DILATOR OVER GUIDEWIRE;  Surgeon: Eartha Angelia Sieving, MD;  Location: [facility];  Service: Gastroenterology;;   TRIGGER FINGER RELEASE Right 03/2024   thumb   TUBAL LIGATION     Social History   Social History Narrative   Married   Lives with spouse and daughter   Right handed.   Caffeine use: 2 cups coffee in morning and 2 cups tea during the day    Immunization History  Administered Date(s) Administered   Fluzone Influenza virus vaccine,trivalent (IIV3), split virus 09/24/2009   Influenza Whole 10/03/2007   Influenza,inj,Quad PF,6+ Mos 10/03/2018, 09/01/2021   Influenza,inj,quad, With Preservative 09/26/2019   Influenza-Unspecified 09/18/2014, 09/23/2015, 09/04/2017   Novel Infuenza-h1n1-09 01/26/2009   Pneumococcal Polysaccharide-23 10/14/2012   Td 01/21/2019     Objective: Vital Signs: BP 111/66   Pulse 71   Temp (!) 97 F (36.1 C)    Resp 14   Ht 5' 11 (1.803 m)   Wt 223 lb 4.8 oz (101.3 kg)   LMP 01/04/2018   BMI 31.14 kg/m    Physical Exam Constitutional:      Appearance: She is obese.  Eyes:     Conjunctiva/sclera: Conjunctivae normal.  Cardiovascular:     Rate and Rhythm: Normal rate and regular rhythm.  Pulmonary:     Effort: Pulmonary effort is normal.     Breath sounds: Normal breath sounds.  Musculoskeletal:     Right lower leg: No edema.     Left lower leg: No edema.  Lymphadenopathy:     Cervical: No cervical adenopathy.  Skin:    General: Skin is warm and dry.     Findings: No rash.  Neurological:     Mental Status: She is alert.  Psychiatric:        Mood and Affect: Mood normal.      Musculoskeletal Exam:  Shoulders full ROM no tenderness or swelling Elbows full ROM no tenderness or swelling Wrists full ROM no tenderness or swelling Fingers full ROM no tenderness or swelling Hip normal internal and external rotation without pain, no tenderness to lateral hip palpation Knees full ROM no tenderness or swelling Ankles full ROM no tenderness or swelling  Investigation: No additional findings.  Imaging: CT ABDOMEN PELVIS W CONTRAST Result Date: 09/23/2024 EXAM: CT ABDOMEN AND PELVIS WITH CONTRAST 09/23/2024 04:48:06 PM TECHNIQUE: CT of the abdomen and pelvis was performed with the administration of 100 mL of iohexol  (OMNIPAQUE ) 300 MG/ML solution. Multiplanar reformatted images are provided for review. Automated exposure control, iterative reconstruction, and/or weight-based adjustment of the mA/kV was utilized to reduce the radiation dose to as low as reasonably achievable. COMPARISON: CT abdomen/pelvis 01/11/2013. CLINICAL HISTORY: Low pelvic pain x 1 week. FINDINGS: LOWER CHEST: No acute abnormality. LIVER: The liver is unremarkable. GALLBLADDER AND BILE DUCTS: Status post cholecystectomy. No biliary ductal dilatation. SPLEEN: No acute abnormality. PANCREAS: No acute abnormality. ADRENAL  GLANDS: No acute abnormality. KIDNEYS, URETERS AND BLADDER: Kidneys enhance symmetrically. Nonobstructive 3 mm calculus in the interpolar right kidney. No hydronephrosis. Urinary bladder is unremarkable. GI AND BOWEL: Stomach demonstrates no acute abnormality. Small bowel and colon are grossly unremarkable. Appendix appears  normal. No obstruction or inflammatory changes. PERITONEUM AND RETROPERITONEUM: No ascites. No free air. VASCULATURE: Aorta is normal in caliber. Aortic atherosclerosis. LYMPH NODES: No lymphadenopathy. REPRODUCTIVE ORGANS: No acute abnormality. BONES AND SOFT TISSUES: No acute osseous abnormality. No suspicious osseous lesion. IMPRESSION: 1. No acute findings in the abdomen or pelvis. 2. Nonobstructive 3 mm right renal calculus without hydronephrosis. 3. Aortic atherosclerosis. Electronically signed by: Shahmeer Marcelino MD 09/23/2024 05:20 PM EDT RP Workstation: [id number]    Recent Labs: Lab Results  Component Value Date   WBC 7.4 08/02/2024   HGB 15.0 08/02/2024   PLT 271 08/02/2024   NA 139 08/02/2024   K 4.2 08/02/2024   CL 104 08/02/2024   CO2 23 08/02/2024   GLUCOSE 96 08/02/2024   BUN 14 08/02/2024   CREATININE 1.20 (H) 09/23/2024   BILITOT 0.4 07/09/2024   ALKPHOS 99 07/09/2024   AST 36 07/09/2024   ALT 50 (H) 07/09/2024   PROT 7.3 07/09/2024   ALBUMIN 5.1 (H) 07/09/2024   CALCIUM  9.6 08/02/2024   GFRAA 50 12/29/2020    Speciality Comments: No specialty comments available.  Procedures:  No procedures performed Allergies: Dust mite extract, Gramineae pollens, Misc. sulfonamide containing compounds, Molds & smuts, Other, Statins, Sulfa antibiotics, Sulfamethoxazole-trimethoprim, Trichophyton, Carbamazepine , Gluten meal, Quetiapine, and Quetiapine fumarate   Assessment / Plan:     Visit Diagnoses: Positive ANA (antinuclear antibody) - Plan: RNP Antibody Positive ANA and low RNP antibody, normal inflammation markers.  - Recheck RNP antibody titer today for  persistent elevation versus reactive process  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HCC)  Musculoskeletal pain and weakness with possible connective tissue disorder and possible inflammatory myopathy or muscular dystrophy Muscle weakness - Plan: CK, Aldolase Genetic testing showed collagen 6A1 gene variants linked to [facility] myopathy and other variants associated with ALS or spinal muscle atrophy. Differential includes connective tissue disorders and inflammatory myopathies. Consider hereditary hemochromatosis as a contributing factor. - Order CK and aldolase levels to assess muscle damage. - Order iron studies panel for hereditary hemochromatosis evaluation. - Review genetic sequencing results for further insights. - Consider rechecking RNP antibody level if blood is drawn.  Evaluation for hereditary hemochromatosis with elevated hemoglobin and hematocrit Polycythemia - Plan: Iron, TIBC and Ferritin Panel Elevated hemoglobin and hematocrit suggest hereditary hemochromatosis, supported by HFE mutations. No recent iron studies available. - Order iron studies panel to check ferritin levels. - Consider serial phlebotomy if hemochromatosis is confirmed.  Degenerative joint disease of the back (osteoarthritis) Degenerative joint disease consistent with osteoarthritis, common post-menopause and possibly influenced by family history. - Continue current symptomatic management. - Consider physical therapy if symptoms worsen.        Orders: Orders Placed This Encounter  Procedures   RNP Antibody   CK   Aldolase   Iron, TIBC and Ferritin Panel   No orders of the defined types were placed in this encounter.    Follow-Up Instructions: Return if symptoms worsen or fail to improve.   Lonni LELON Ester, MD  Note - This record has been created using [employer].  Chart creation errors have been sought, but may not always  have been located. Such creation errors do not reflect on  the  standard of medical care.

## 2024-09-26 ENCOUNTER — Encounter: Payer: Self-pay | Admitting: Internal Medicine

## 2024-09-26 ENCOUNTER — Other Ambulatory Visit (HOSPITAL_COMMUNITY): Payer: Self-pay

## 2024-09-26 ENCOUNTER — Encounter: Admitting: Internal Medicine

## 2024-09-27 LAB — IRON,TIBC AND FERRITIN PANEL
%SAT: 21 % (ref 16–45)
Ferritin: 13 ng/mL — ABNORMAL LOW (ref 16–232)
Iron: 68 ug/dL (ref 45–160)
TIBC: 324 ug/dL (ref 250–450)

## 2024-09-27 LAB — CK: Total CK: 238 U/L (ref 21–240)

## 2024-09-27 LAB — ALDOLASE: Aldolase: 4.2 U/L (ref <=8.1)

## 2024-09-27 LAB — RNP ANTIBODY: Ribonucleic Protein(ENA) Antibody, IgG: 1.4 AI — AB

## 2024-10-01 ENCOUNTER — Ambulatory Visit (HOSPITAL_COMMUNITY): Admitting: Psychiatry

## 2024-10-01 DIAGNOSIS — F3181 Bipolar II disorder: Secondary | ICD-10-CM | POA: Diagnosis not present

## 2024-10-01 DIAGNOSIS — F411 Generalized anxiety disorder: Secondary | ICD-10-CM | POA: Diagnosis not present

## 2024-10-01 DIAGNOSIS — D801 Nonfamilial hypogammaglobulinemia: Secondary | ICD-10-CM | POA: Diagnosis not present

## 2024-10-03 ENCOUNTER — Other Ambulatory Visit (HOSPITAL_COMMUNITY): Payer: Self-pay

## 2024-10-04 ENCOUNTER — Other Ambulatory Visit: Payer: Self-pay

## 2024-10-04 ENCOUNTER — Other Ambulatory Visit (HOSPITAL_COMMUNITY): Payer: Self-pay

## 2024-10-04 MED ORDER — ALBUTEROL SULFATE HFA 108 (90 BASE) MCG/ACT IN AERS
2.0000 | INHALATION_SPRAY | Freq: Four times a day (QID) | RESPIRATORY_TRACT | 5 refills | Status: AC | PRN
  Filled 2024-10-04: qty 6.7, 25d supply, fill #0

## 2024-10-05 ENCOUNTER — Other Ambulatory Visit (HOSPITAL_COMMUNITY): Payer: Self-pay

## 2024-10-07 ENCOUNTER — Other Ambulatory Visit: Payer: Self-pay

## 2024-10-08 ENCOUNTER — Telehealth (INDEPENDENT_AMBULATORY_CARE_PROVIDER_SITE_OTHER): Admitting: Psychiatry

## 2024-10-08 ENCOUNTER — Encounter (HOSPITAL_COMMUNITY): Payer: Self-pay | Admitting: Psychiatry

## 2024-10-08 ENCOUNTER — Telehealth: Payer: Self-pay

## 2024-10-08 ENCOUNTER — Other Ambulatory Visit: Payer: Self-pay

## 2024-10-08 DIAGNOSIS — F319 Bipolar disorder, unspecified: Secondary | ICD-10-CM

## 2024-10-08 DIAGNOSIS — F332 Major depressive disorder, recurrent severe without psychotic features: Secondary | ICD-10-CM

## 2024-10-08 DIAGNOSIS — F900 Attention-deficit hyperactivity disorder, predominantly inattentive type: Secondary | ICD-10-CM

## 2024-10-08 DIAGNOSIS — F3181 Bipolar II disorder: Secondary | ICD-10-CM | POA: Diagnosis not present

## 2024-10-08 DIAGNOSIS — F411 Generalized anxiety disorder: Secondary | ICD-10-CM | POA: Diagnosis not present

## 2024-10-08 MED ORDER — METHYLPHENIDATE HCL 10 MG PO TABS
10.0000 mg | ORAL_TABLET | Freq: Two times a day (BID) | ORAL | 0 refills | Status: DC
  Filled 2024-10-08: qty 60, 30d supply, fill #0

## 2024-10-08 MED ORDER — CLONAZEPAM 0.5 MG PO TABS
0.5000 mg | ORAL_TABLET | Freq: Every day | ORAL | 2 refills | Status: DC
  Filled 2024-10-08: qty 30, 30d supply, fill #0

## 2024-10-08 MED ORDER — LAMOTRIGINE 25 MG PO TABS
25.0000 mg | ORAL_TABLET | Freq: Two times a day (BID) | ORAL | 2 refills | Status: AC
  Filled 2024-10-08: qty 60, 30d supply, fill #0
  Filled 2024-11-05: qty 60, 30d supply, fill #1
  Filled 2024-12-20: qty 60, 30d supply, fill #2

## 2024-10-08 MED ORDER — FLUOXETINE HCL 20 MG PO CAPS
20.0000 mg | ORAL_CAPSULE | Freq: Every day | ORAL | 2 refills | Status: DC
  Filled 2024-10-08: qty 30, 30d supply, fill #0
  Filled 2024-11-05: qty 30, 30d supply, fill #1

## 2024-10-08 MED ORDER — FLUOXETINE HCL 40 MG PO CAPS
40.0000 mg | ORAL_CAPSULE | Freq: Every day | ORAL | 2 refills | Status: DC
  Filled 2024-10-08: qty 30, 30d supply, fill #0
  Filled 2024-11-05: qty 30, 30d supply, fill #1

## 2024-10-08 MED ORDER — TRAZODONE HCL 50 MG PO TABS
50.0000 mg | ORAL_TABLET | Freq: Every day | ORAL | 2 refills | Status: DC
  Filled 2024-10-08: qty 30, 30d supply, fill #0
  Filled 2024-11-05: qty 30, 30d supply, fill #1

## 2024-10-08 NOTE — Progress Notes (Signed)
 Virtual Visit via Video Note  I connected with Michelle Clements on 10/08/24 at  9:20 AM EST by a video enabled telemedicine application and verified that I am speaking with the correct person using two identifiers.  Location: Patient: home Provider: office   I discussed the limitations of evaluation and management by telemedicine and the availability of in person appointments. The patient expressed understanding and agreed to proceed.      I discussed the assessment and treatment plan with the patient. The patient was provided an opportunity to ask questions and all were answered. The patient agreed with the plan and demonstrated an understanding of the instructions.   The patient was advised to call back or seek an in-person evaluation if the symptoms worsen or if the condition fails to improve as anticipated.  I provided 20 minutes of non-face-to-face time during this encounter.   [name], MD  [facility] MD/PA/NP OP Progress Note  10/08/2024 9:41 AM Michelle Clements  MRN:  [id number]  Chief Complaint:  Chief Complaint  Patient presents with   Anxiety   Depression   Follow-up   HPI: This patient is a 58 year old married white female who lives with her husband in [place]. She has 2 grown daughters. 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  She states that she has not been particularly depressed lately.  However she feels like her thoughts are constantly racing and it is hard to pin down an idea before her mind goes to the next 1.  She does not feel particularly anxious or hyperactive and she is sleeping well.  She has not been impulsive or making poor decisions.  The patient notes that in college she was diagnosed with ADD and was taking methylphenidate which did help to some degree.  She would like to try something like this again.  We discussed at length whether  or not this was a manifestation of anxiety and I am still not entirely sure however it be worth a short trial of methylphenidate.  If she becomes more anxious she can let me know right away Visit Diagnosis:    ICD-10-CM   1. Bipolar 1 disorder (HCC)  F31.9     2. Major depressive disorder, recurrent, severe without psychotic features (HCC)  F33.2     3. Attention deficit hyperactivity disorder (ADHD), predominantly inattentive type  F90.0       Past Psychiatric History:  Admission 2 years ago for overdose/suicide attempt followed by partial hospital program. She was also admitted for detox several years ago   Past Medical History:  Past Medical History:  Diagnosis Date   Allergic rhinitis    Anemia    Arthritis    Lower back and hips   Asthma    Bipolar disorder (HCC)    Bronchitis 05/2024   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HCC)    Constipation    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HCC)    Depression    Essential hypertension, benign    Fibromyalgia    GERD (gastroesophageal reflux disease)    H/O sleep apnea    Hip pain, left    History of palpitations    Negative Holter monitor   History of pneumonia 12/05/1986   Hyperlipidemia    IBS (irritable bowel syndrome)    Nausea and vomiting 02/16/2024   Neck pain    Obstructive sleep apnea (  adult) (pediatric) 04/12/2021   Poor short term memory    PTSD (post-traumatic stress disorder)    S/P Botox injection 11/04/2014   For migraine headaches   Type 2 diabetes mellitus (HCC)    Urgency of urination     Past Surgical History:  Procedure Laterality Date   CARPAL TUNNEL RELEASE Right 06/10/2013   Procedure: CARPAL TUNNEL RELEASE;  Surgeon: Darina MALVA Boehringer, MD;  Location: [facility];  Service: Neurosurgery;  Laterality: Right;  Right Carpal Tunnel Release    CHOLECYSTECTOMY     COLONOSCOPY N/A 07/30/2024   Procedure: COLONOSCOPY;  Surgeon: Eartha Angelia Sieving, MD;  Location: [facility];  Service:  Gastroenterology;  Laterality: N/A;  930am, asa 3   DENTAL SURGERY  12/05/2014   ESOPHAGOGASTRODUODENOSCOPY (EGD) WITH PROPOFOL  N/A 02/16/2024   Procedure: ESOPHAGOGASTRODUODENOSCOPY (EGD) WITH PROPOFOL ;  Surgeon: Eartha Angelia Sieving, MD;  Location: [facility];  Service: Gastroenterology;  Laterality: N/A;  11:30am;ASA 1   mole exc     x 3   MUSCLE BIOPSY  12/05/2006   Right leg   POLYPECTOMY  02/16/2024   Procedure: POLYPECTOMY;  Surgeon: Eartha Angelia, Sieving, MD;  Location: [facility];  Service: Gastroenterology;;   HARLEY DILATION  02/16/2024   Procedure: EGD, WITH DILATION USING SAVARY-GILLIARD DILATOR OVER GUIDEWIRE;  Surgeon: Eartha Angelia, Sieving, MD;  Location: [facility];  Service: Gastroenterology;;   TRIGGER FINGER RELEASE Right 03/2024   thumb   TUBAL LIGATION      Family Psychiatric History: See below  Family History:  Family History  Problem Relation Age of Onset   Fibromyalgia Mother    Diabetes type II Mother    Depression Mother    Alzheimer's disease Mother    GER disease Mother    Heart disease Mother    Paranoid behavior Mother    Dementia Mother    Heart attack Father 92       MI x5   Stroke Father        CVA x7   Asthma Father    Brain cancer Father    Seizures Father    Anxiety disorder Sister    OCD Sister    Sexual abuse Sister    Physical abuse Sister    Anxiety disorder Sister    Fibromyalgia Sister    Other Sister        Temporomandibular Joint   ADD / ADHD Daughter    ADD / ADHD Daughter    Alcohol abuse Neg Hx    Drug abuse Neg Hx    Schizophrenia Neg Hx     Social History:  Social History   Socioeconomic History   Marital status: Married    Spouse name: Not on file   Number of children: 2   Years of education: College   Highest education level: Associate degree: academic program  Occupational History   Occupation: Chiropractor: UNEMPLOYED    Comment: Now disabled due to bipolar and  fibromyalgia  Tobacco Use   Smoking status: Former    Current packs/day: 0.00    Types: Cigarettes    Quit date: 11/04/2010    Years since quitting: 13.9    Passive exposure: Past   Smokeless tobacco: Never  Vaping Use   Vaping status: Never Used  Substance and Sexual Activity   Alcohol use: Yes    Comment: one mixed drink per month   Drug use: No   Sexual activity: Yes    Birth  control/protection: Surgical  Other Topics Concern   Not on file  Social History Narrative   Married   Lives with spouse and daughter   Right handed.   Caffeine use: 2 cups coffee in morning and 2 cups tea during the day    Social Drivers of Health   Financial Resource Strain: Low Risk  (09/24/2024)   Overall Financial Resource Strain (CARDIA)    Difficulty of Paying Living Expenses: Not hard at all  Food Insecurity: No Food Insecurity (09/24/2024)   Hunger Vital Sign    Worried About Running Out of Food in the Last Year: Never true    Ran Out of Food in the Last Year: Never true  Transportation Needs: No Transportation Needs (09/24/2024)   PRAPARE - Administrator, Civil Service (Medical): No    Lack of Transportation (Non-Medical): No  Physical Activity: Inactive (09/24/2024)   Exercise Vital Sign    Days of Exercise per Week: 0 days    Minutes of Exercise per Session: 0 min  Stress: Stress Concern Present (09/24/2024)   [employer] of Occupational Health - Occupational Stress Questionnaire    Feeling of Stress: To some extent  Social Connections: Socially Integrated (09/24/2024)   Social Connection and Isolation Panel    Frequency of Communication with Friends and Family: More than three times a week    Frequency of Social Gatherings with Friends and Family: Twice a week    Attends Religious Services: More than 4 times per year    Active Member of [employer] or Organizations: Yes    Attends Banker Meetings: Never    Marital Status: Married    Allergies:   Allergies  Allergen Reactions   Dust Mite Extract Shortness Of Breath, Other (See Comments) and Cough    Wheezing     Gramineae Pollens Shortness Of Breath    wheezing   Misc. Sulfonamide Containing Compounds Anaphylaxis   Molds & Smuts Shortness Of Breath    wheezing     Other Anaphylaxis, Other (See Comments) and Shortness Of Breath    : Angioedema  wheezing  Wheezing  Other reaction(s): Cough  Itching and rash  Other reaction(s): Other (See Comments)   : Angioedema   Statins Anaphylaxis    Patient states she is currently on a Statin    Sulfa Antibiotics Anaphylaxis and Other (See Comments)    : Angioedema  : Angioedema  : Angioedema     : Angioedema    : Angioedema  : Angioedema   : Angioedema,  : Angioedema     : Angioedema,  : Angioedema   Sulfamethoxazole-Trimethoprim Anaphylaxis and Other (See Comments)   Trichophyton Shortness Of Breath    wheezing     Carbamazepine  Rash, Dermatitis and Other (See Comments)    Other Reaction(s): Other (See Comments)   Gluten Meal Itching and Other (See Comments)    Itching and rash Other reaction(s): Abdominal Pain    Quetiapine Other (See Comments)   Quetiapine Fumarate Other (See Comments)    Metabolic Disorder Labs: Lab Results  Component Value Date   HGBA1C 5.8 (A) 06/14/2024   MPG 131 03/18/2015   No results found for: PROLACTIN Lab Results  Component Value Date   CHOL 165 12/05/2023   TRIG 165 (H) 12/05/2023   HDL 49 12/05/2023   CHOLHDL 3.4 12/05/2023   VLDL 44 (H) 03/18/2015   LDLCALC 88 12/05/2023   LDLCALC 126 03/31/2022   Lab Results  Component Value Date  TSH 1.500 03/05/2024   TSH 1.030 10/27/2023    Therapeutic Level Labs: Lab Results  Component Value Date   LITHIUM  0.20 (L) 02/21/2012   No results found for: VALPROATE No results found for: CBMZ  Current Medications: Current Outpatient Medications  Medication Sig Dispense Refill   methylphenidate (RITALIN) 10 MG  tablet Take 1 tablet (10 mg total) by mouth 2 (two) times daily with breakfast and lunch. 60 tablet 0   albuterol  (VENTOLIN  HFA) 108 (90 Base) MCG/ACT inhaler Inhale 2 puffs into the lungs every 6 (six) hours as needed for wheezing and/or shortness of breath. 6.7 g 5   aspirin EC 81 MG tablet Take 1 tablet every day by oral route.     azelastine  (ASTELIN ) 0.1 % nasal spray Place 1 spray into both nostrils 2 (two) times daily. 30 mL 2   Blood Glucose Monitoring Suppl (ONETOUCH VERIO FLEX SYSTEM) w/Device KIT Use as directed to check blood glucose twice daily, before breakfast and before bed. (Patient not taking: Reported on 09/25/2024) 1 kit 0   Blood Glucose Monitoring Suppl (ONETOUCH VERIO) w/Device KIT Use to check glucose once daily 1 kit 0   buPROPion  (WELLBUTRIN  XL) 150 MG 24 hr tablet Take 1 tablet (150 mg total) by mouth every morning. 30 tablet 2   cetirizine  (ZYRTEC ) 10 MG tablet Take 10 mg by mouth at bedtime.     clonazePAM  (KLONOPIN ) 0.5 MG tablet Take 1 tablet (0.5 mg total) by mouth at bedtime. 30 tablet 2   Coenzyme Q10 (COQ10 PO) Take by mouth. One daily     Continuous Glucose Sensor (FREESTYLE LIBRE 3 PLUS SENSOR) MISC Change sensor every 15 days. 6 each 3   cycloSPORINE  (RESTASIS ) 0.05 % ophthalmic emulsion Place 1 drop into both eyes 2 (two) times daily. 180 each 99   dapagliflozin  propanediol (FARXIGA ) 10 MG TABS tablet Take 1 tablet (10 mg total) by mouth in the morning. 30 tablet 11   estradiol  (ESTRACE ) 0.1 MG/GM vaginal cream Place 1 Applicatorful vaginally 3 (three) times a week. 42.5 g 1   FLUoxetine  (PROZAC ) 20 MG capsule Take 1 capsule (20 mg total) by mouth daily. 30 capsule 2   FLUoxetine  (PROZAC ) 40 MG capsule Take 1 capsule (40 mg total) by mouth daily. 30 capsule 2   fluticasone  (FLONASE ) 50 MCG/ACT nasal spray Place 1 spray into both nostrils daily. 16 g 3   Fluticasone -Umeclidin-Vilant (TRELEGY ELLIPTA ) 100-62.5-25 MCG/ACT AEPB Inhale 1 puff into the lungs.      Galcanezumab -gnlm (EMGALITY ) 120 MG/ML SOSY Inject 1 mL into the skin every 30 (thirty) days. 1 mL 11   Immune Globulin , Human,-klhw (XEMBIFY ) 10 GM/50ML SOLN Inject 10 g into the skin every 7 (seven) days. 200 mL 3   insulin  glargine (LANTUS ) 100 UNIT/ML Solostar Pen Inject 40 Units into the skin at bedtime. (Patient taking differently: Inject 30 Units into the skin at bedtime.) 36 mL 3   ipratropium-albuterol  (DUONEB) 0.5-2.5 (3) MG/3ML SOLN Take 3 mLs by nebulization 2 (two) times daily as needed. 90 mL 2   lamoTRIgine  (LAMICTAL ) 25 MG tablet Take 1 tablet (25 mg total) by mouth 2 (two) times daily. 60 tablet 2   Lancets (ONETOUCH ULTRASOFT) lancets Use to check blood sugar once daily 100 each 3   Latanoprostene Bunod  (VYZULTA ) 0.024 % SOLN Place 1 drop into both eyes at bedtime as directed (Patient not taking: Reported on 09/25/2024) 7.5 mL 3   Latanoprostene Bunod  (VYZULTA ) 0.024 % SOLN Place 1 drop into both  eyes at bedtime as directed. (Patient not taking: Reported on 09/25/2024) 15 mL 3   Latanoprostene Bunod  (VYZULTA ) 0.024 % SOLN Place 1 drop into both eyes at bedtime as directed. 15 mL 3   lubiprostone  (AMITIZA ) 24 MCG capsule Take 1 capsule (24 mcg total) by mouth 2 (two) times daily. (Patient not taking: Reported on 09/25/2024) 60 capsule 3   omeprazole  (PRILOSEC) 20 MG capsule Take 1 capsule (20 mg total) by mouth 2 (two) times daily. 60 capsule 2   ondansetron  (ZOFRAN -ODT) 8 MG disintegrating tablet Dissolve 1 tablet (8 mg total) by mouth every 8 (eight) hours as needed for nausea or vomiting. 20 tablet 6   ONETOUCH VERIO test strip Use to check glucose once daily. 100 each 3   OVER THE COUNTER MEDICATION Magnessium 500 mg at night     pregabalin  (LYRICA ) 200 MG capsule Take 1 capsule (200 mg total) by mouth 3 (three) times daily. 90 capsule 5   rosuvastatin  (CRESTOR ) 40 MG tablet Take 1 tablet (40 mg total) by mouth at bedtime. 90 tablet 1   tiZANidine  (ZANAFLEX ) 4 MG tablet Take 1  tablet (4 mg total) by mouth every 12 (twelve) hours as needed. 60 tablet 11   traMADol  (ULTRAM ) 50 MG tablet Take 1 tablet (50 mg total) by mouth every 6 (six) hours as needed. 15 tablet 0   traZODone  (DESYREL ) 50 MG tablet Take 1 tablet (50 mg total) by mouth at bedtime. 30 tablet 2   UNABLE TO FIND 1 each by Does not apply route daily. Med Name: CPAP SUPPLIES Full face mask Cushion for mask -Medium ResMed 1 each 11   VITAMIN D  PO Take 5,000 Units by mouth daily.     zinc gluconate 50 MG tablet Take 50 mg by mouth daily.     zolmitriptan  (ZOMIG ) 5 MG nasal solution Administer 5 mg into affected nostril(s) as needed (1 nasal spray at onset of severe headache; may repeat once after 2 hours if headache persists; max of 2 doses in 24 hours or 4/week.). 12 each 5   zonisamide  (ZONEGRAN ) 50 MG capsule Take 1 capsule by mouth at bedtime for 5 days then increase to 2 capsules at bedtime thereafter. 60 capsule 3   No current facility-administered medications for this visit.     Musculoskeletal: Strength & Muscle Tone: within normal limits Gait & Station: normal Patient leans: N/A  Psychiatric Specialty Exam: Review of Systems  Psychiatric/Behavioral:  Positive for decreased concentration.   All other systems reviewed and are negative.   Last menstrual period 01/04/2018.There is no height or weight on file to calculate BMI.  General Appearance: Casual and Fairly Groomed  Eye Contact:  Good  Speech:  Clear and Coherent  Volume:  Normal  Mood:  Euthymic  Affect:  Congruent  Thought Process:  Goal Directed  Orientation:  Full (Time, Place, and Person)  Thought Content: Tangential by her report  Suicidal Thoughts:  No  Homicidal Thoughts:  No  Memory:  Immediate;   Good Recent;   Good Remote;   Good  Judgement:  Good  Insight:  Good  Psychomotor Activity:  Normal  Concentration:  Concentration: Poor and Attention Span: Poor  Recall:  Good  Fund of Knowledge: Good  Language: Good   Akathisia:  No  Handed:  Right  AIMS (if indicated): not done  Assets:  Communication Skills Desire for Improvement Resilience Social Support Talents/Skills  ADL's:  Intact  Cognition: WNL  Sleep:  Good   Screenings:  GAD-7    Flowsheet Row Video Visit from 05/23/2024 in [facility] Office Visit from 03/08/2024 in [facility] Telemedicine from 01/25/2024 in [facility] Office Visit from 12/05/2023 in [facility]  Total GAD-7 Score 0 13 10 10    Mini-Mental    Flowsheet Row Office Visit from 08/29/2019 in [facility]  Total Score (max 30 points ) 27   PHQ2-9    Flowsheet Row Clinical Support from 09/24/2024 in [facility] Primary [facility] Office Visit from 07/18/2024 in [facility] [place] Video Visit from 05/23/2024 in [facility] Care Office Visit from 03/08/2024 in [facility] Primary Care Telemedicine from 01/25/2024 in [facility] Primary Care  PHQ-2 Total Score 2 0 0 4 2  PHQ-9 Total Score 8 -- 0 17 13   Flowsheet Row ED from 08/02/2024 in [facility] Emergency Department at [facility] Admission (Discharged) from 07/30/2024 in [facility] Admission (Discharged) from 02/16/2024 in [facility]  C-SSRS RISK CATEGORY No Risk No Risk No Risk     Assessment and Plan: This patient is a 58 year old female with a history of posttraumatic stress disorder, possible bipolar disorder major depression generalized anxiety and now is focused on the possibility of ADD.  We will try methylphenidate 10 mg twice daily for ADD but if it makes the anxiety worse we will need to stop it.  She will also continue Prozac  60 mg daily along with Wellbutrin  XL 150 mg daily for depression, trazodone  for 50 mg at bedtime for sleep, Lamictal  25 mg twice daily for mood  stabilization.  She still has clonazepam  0.5 mg to use at bedtime for sleep but she rarely needs it.  She will return to see me in 4 weeks  Collaboration of Care: Collaboration of Care: Primary Care Provider AEB notes will be shared with PCP at patient's request.  The patient states that she has found a new therapist  Patient/Guardian was advised Release of Information must be obtained prior to any record release in order to collaborate their care with an outside provider. Patient/Guardian was advised if they have not already done so to contact the registration department to sign all necessary forms in order for us  to release information regarding their care.   Consent: Patient/Guardian gives verbal consent for treatment and assignment of benefits for services provided during this visit. Patient/Guardian expressed understanding and agreed to proceed.    [name], MD 10/08/2024, 9:41 AM

## 2024-10-08 NOTE — Telephone Encounter (Signed)
 Copied from CRM #[id number]. Topic: Clinical - Medical Advice >> Oct 08, 2024  9:53 AM Terri MATSU wrote: Reason for CRM: Patient stated her sleep doctor wants to do another sleep study since her numbers keep dropping, but he wanted to know if Dr.Huenink might want to send her to a pulmonologist. Callback number [phone]

## 2024-10-09 ENCOUNTER — Other Ambulatory Visit (HOSPITAL_COMMUNITY): Payer: Self-pay

## 2024-10-09 ENCOUNTER — Other Ambulatory Visit: Payer: Self-pay

## 2024-10-10 ENCOUNTER — Other Ambulatory Visit: Payer: Self-pay

## 2024-10-10 DIAGNOSIS — G4733 Obstructive sleep apnea (adult) (pediatric): Secondary | ICD-10-CM

## 2024-10-10 NOTE — Telephone Encounter (Signed)
 Pt advised pulmonary has already reached out to her to schedule an appointment

## 2024-10-10 NOTE — Telephone Encounter (Signed)
 Referral to pulmonologist placed for here at [facility]

## 2024-10-12 ENCOUNTER — Other Ambulatory Visit (HOSPITAL_COMMUNITY): Payer: Self-pay

## 2024-10-14 ENCOUNTER — Other Ambulatory Visit: Payer: Self-pay

## 2024-10-15 DIAGNOSIS — H04123 Dry eye syndrome of bilateral lacrimal glands: Secondary | ICD-10-CM | POA: Diagnosis not present

## 2024-10-15 DIAGNOSIS — D801 Nonfamilial hypogammaglobulinemia: Secondary | ICD-10-CM | POA: Diagnosis not present

## 2024-10-15 DIAGNOSIS — H18513 Endothelial corneal dystrophy, bilateral: Secondary | ICD-10-CM | POA: Diagnosis not present

## 2024-10-16 ENCOUNTER — Other Ambulatory Visit: Payer: Self-pay

## 2024-10-17 ENCOUNTER — Ambulatory Visit: Admitting: Gastroenterology

## 2024-10-17 VITALS — BP 134/81 | HR 83 | Temp 97.8°F | Ht 71.0 in | Wt 219.0 lb

## 2024-10-17 DIAGNOSIS — R197 Diarrhea, unspecified: Secondary | ICD-10-CM

## 2024-10-17 DIAGNOSIS — R634 Abnormal weight loss: Secondary | ICD-10-CM | POA: Diagnosis not present

## 2024-10-17 NOTE — Progress Notes (Addendum)
 Gastroenterology Office Note     Primary Care Physician:  Bevely Doffing, FNP  Primary Gastroenterologist: Dr Eartha    Chief Complaint   Chief Complaint  Patient presents with   Follow-up    Pt states she is still having issues and there are some things she wants to discuss with you that are GI related. Follow up after CT     History of Present Illness   Michelle Clements is a 58 y.o. 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    At last visit, she had lost 9 lbs from May 2025. CT completed unrevealing. Amitiza  was stopped due to diarrhea. She then had a bout with constipation and started it once a day. Will have a few days without a BM, then will have loose stools. Daughter diagnosed with carcinoid tumor of lungs.   Miralax : causes lots of gurgling, linzess without improvement, ibsrela without improvement.   Lost 4 lbs since last visit.   Blood sugars have been running lower than they have been historically. Used to smoke. Stopped in early 2000s.   Recent Iron studies by outside office with markedly low ferritin at 13, iron 68, sats 21%. Hgb 15 in Aug 2025. Genome sequencing noted medium risk for hemochromatosis  Always feels fatigued. Craves chewing gum. Unable to chew ice due to sensitive teeth. Has restless legs, headaches.   Still has some watery stool but has substance in it, and sometimes just soft, squishy stool. Denies constipation. BM may be every day, skip days. She had resumed Amitiza  24 mcg once daily. Has film on stool.     Colonoscopy Aug 2025: Six 2 to 6 mm polyps in the transverse colon and                            in the ascending colon, removed with a cold snare.                             Resected and retrieved.                           - One 5 mm polyp in the descending colon, removed                            with a cold snare. Resected and retrieved.                           - Non-bleeding internal hemorrhoids.                          Path with tubular adenomas and surveillance in 3 years.      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    EGD March 2025: empiric dilation, few gastric polyps s/p resection and retrieval of one, normal stomach s/p biopsy. Surveillance in 2 years for gastric mapping due to metaplasia     Past Medical History:  Diagnosis Date   Allergic rhinitis    Anemia  Arthritis    Lower back and hips   Asthma    Bipolar disorder (HCC)    Bronchitis 05/2024   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HCC)    Constipation    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HCC)    Depression    Essential hypertension, benign    Fibromyalgia    GERD (gastroesophageal reflux disease)    H/O sleep apnea    Hip pain, left    History of palpitations    Negative Holter monitor   History of pneumonia 12/05/1986   Hyperlipidemia    IBS (irritable bowel syndrome)    Nausea and vomiting 02/16/2024   Neck pain    Obstructive sleep apnea (adult) (pediatric) 04/12/2021   Poor short term memory    PTSD (post-traumatic stress disorder)    S/P Botox injection 11/04/2014   For migraine headaches   Type 2 diabetes mellitus (HCC)    Urgency of urination     Past Surgical History:  Procedure Laterality Date   CARPAL TUNNEL RELEASE Right 06/10/2013   Procedure: CARPAL TUNNEL RELEASE;  Surgeon: Darina MALVA Boehringer, MD;  Location: [facility];  Service: Neurosurgery;  Laterality: Right;  Right Carpal Tunnel Release    CHOLECYSTECTOMY     COLONOSCOPY N/A 07/30/2024   Procedure: COLONOSCOPY;  Surgeon: Eartha Angelia Sieving, MD;  Location: [facility];  Service: Gastroenterology;  Laterality:  N/A;  930am, asa 3   DENTAL SURGERY  12/05/2014   ESOPHAGOGASTRODUODENOSCOPY (EGD) WITH PROPOFOL  N/A 02/16/2024   Procedure: ESOPHAGOGASTRODUODENOSCOPY (EGD) WITH PROPOFOL ;  Surgeon: Eartha Angelia Sieving, MD;  Location: [facility];  Service: Gastroenterology;  Laterality: N/A;  11:30am;ASA 1   mole exc     x 3   MUSCLE BIOPSY  12/05/2006   Right leg   POLYPECTOMY  02/16/2024   Procedure: POLYPECTOMY;  Surgeon: Eartha Angelia Sieving, MD;  Location: [facility];  Service: Gastroenterology;;   HARLEY DILATION  02/16/2024   Procedure: EGD, WITH DILATION USING SAVARY-GILLIARD DILATOR OVER GUIDEWIRE;  Surgeon: Eartha Angelia, Sieving, MD;  Location: [facility];  Service: Gastroenterology;;   TRIGGER FINGER RELEASE Right 03/2024   thumb   TUBAL LIGATION      Current Outpatient Medications  Medication Sig Dispense Refill   albuterol  (VENTOLIN  HFA) 108 (90 Base) MCG/ACT inhaler Inhale 2 puffs into the lungs every 6 (six) hours as needed for wheezing and/or shortness of breath. 6.7 g 5   aspirin EC 81 MG tablet Take 1 tablet every day by oral route.     azelastine  (ASTELIN ) 0.1 % nasal spray Place 1 spray into both nostrils 2 (two) times daily. 30 mL 2   Blood Glucose Monitoring Suppl (ONETOUCH VERIO) w/Device KIT Use to check glucose once daily 1 kit 0   buPROPion  (WELLBUTRIN  XL) 150 MG 24 hr tablet Take 1 tablet (150 mg total) by mouth every morning. 30 tablet 2   cetirizine  (ZYRTEC ) 10 MG tablet Take 10 mg by mouth at bedtime.     clonazePAM  (KLONOPIN ) 0.5 MG tablet Take 1 tablet (0.5 mg total) by mouth at bedtime. 30 tablet 2   Coenzyme Q10 (COQ10 PO) Take by mouth. One daily     Continuous Glucose Sensor (FREESTYLE LIBRE 3 PLUS SENSOR) MISC Change sensor every 15 days. 6 each 3   cycloSPORINE  (RESTASIS ) 0.05 % ophthalmic emulsion Place 1 drop into both eyes 2 (two) times daily. 180 each 99   dapagliflozin  propanediol (FARXIGA ) 10 MG TABS tablet Take 1 tablet (10 mg total)  by mouth in the morning. 30 tablet 11   estradiol  (ESTRACE ) 0.1 MG/GM vaginal cream Place 1 Applicatorful vaginally 3 (three) times a week. 42.5 g 1   FLUoxetine  (PROZAC ) 20 MG capsule Take 1 capsule (20 mg total) by mouth daily. 30 capsule 2   FLUoxetine  (PROZAC ) 40 MG capsule Take 1 capsule (40 mg total) by mouth daily. 30 capsule 2   fluticasone  (FLONASE ) 50 MCG/ACT nasal spray Place 1 spray into both nostrils daily. 16 g 3   Fluticasone -Umeclidin-Vilant (TRELEGY ELLIPTA ) 100-62.5-25 MCG/ACT AEPB Inhale 1 puff into the lungs.     Galcanezumab -gnlm (EMGALITY ) 120 MG/ML SOSY Inject 1 mL into the skin every 30 (thirty) days. 1 mL 11   Immune Globulin , Human,-klhw (XEMBIFY ) 10 GM/50ML SOLN Inject 10 g into the skin every 7 (seven) days. 200 mL 3   insulin  glargine (LANTUS ) 100 UNIT/ML Solostar Pen Inject 40 Units into the skin at bedtime. (Patient taking differently: Inject 30 Units into the skin at bedtime.) 36 mL 3   ipratropium-albuterol  (DUONEB) 0.5-2.5 (3) MG/3ML SOLN Take 3 mLs by nebulization 2 (two) times daily as needed. 90 mL 2   lamoTRIgine  (LAMICTAL ) 25 MG tablet Take 1 tablet (25 mg total) by mouth 2 (two) times daily. 60 tablet 2   Lancets (ONETOUCH ULTRASOFT) lancets Use to check blood sugar once daily 100 each 3   Latanoprostene Bunod  (VYZULTA ) 0.024 % SOLN Place 1 drop into both eyes at bedtime as directed. 15 mL 3   methylphenidate  (RITALIN ) 10 MG tablet Take 1 tablet (10 mg total) by mouth 2 (two) times daily with breakfast and lunch. 60 tablet 0   omeprazole  (PRILOSEC) 20 MG capsule Take 1 capsule (20 mg total) by mouth 2 (two) times daily. 60 capsule 2   ondansetron  (ZOFRAN -ODT) 8 MG disintegrating tablet Dissolve 1 tablet (8 mg total) by mouth every 8 (eight) hours as needed for nausea or vomiting. 20 tablet 6   ONETOUCH VERIO test strip Use to check glucose once daily. 100 each 3   OVER THE COUNTER MEDICATION Magnessium 500 mg at night     pregabalin  (LYRICA ) 200 MG capsule  Take 1 capsule (200 mg total) by mouth 3 (three) times daily. 90 capsule 5   rosuvastatin  (CRESTOR ) 40 MG tablet Take 1 tablet (40 mg total) by mouth at bedtime. 90 tablet 1   tiZANidine  (ZANAFLEX ) 4 MG tablet Take 1 tablet (4 mg total) by mouth every 12 (twelve) hours as needed. 60 tablet 11   traMADol  (ULTRAM ) 50 MG tablet Take 1 tablet (50 mg total) by mouth every 6 (six) hours as needed. 15 tablet 0   traZODone  (DESYREL ) 50 MG tablet Take 1 tablet (50 mg total) by mouth at bedtime. 30 tablet 2   VITAMIN D  PO Take 5,000 Units by mouth daily.     zinc gluconate 50 MG tablet Take 50 mg by mouth daily.     zolmitriptan  (ZOMIG ) 5 MG nasal solution Administer 5 mg into affected nostril(s) as needed (1 nasal spray at onset of severe headache; may repeat once after 2 hours if headache persists; max of 2 doses in 24 hours or 4/week.). 12 each 5   zonisamide  (ZONEGRAN ) 50 MG capsule Take 1 capsule by mouth at bedtime for 5 days then increase to 2 capsules at bedtime thereafter. 60 capsule 3   UNABLE TO FIND 1 each by Does not apply route daily. Med Name: CPAP SUPPLIES Full face mask Cushion for mask -Medium ResMed 1 each 11  No current facility-administered medications for this visit.    Allergies as of 10/17/2024 - Review Complete 10/17/2024  Allergen Reaction Noted   Dust mite extract Shortness Of Breath, Other (See Comments), and Cough 09/17/2014   Gramineae pollens Shortness Of Breath 09/17/2014   Misc. sulfonamide containing compounds Anaphylaxis 03/28/2024   Molds & smuts Shortness Of Breath 09/17/2014   Other Anaphylaxis, Other (See Comments), and Shortness Of Breath 09/17/2014   Statins Anaphylaxis 09/26/2014   Sulfa antibiotics Anaphylaxis and Other (See Comments) 09/17/2014   Sulfamethoxazole-trimethoprim Anaphylaxis and Other (See Comments) 09/21/2022   Trichophyton Shortness Of Breath 09/17/2014   Carbamazepine  Rash, Dermatitis, and Other (See Comments) 06/03/2015   Gluten meal  Itching and Other (See Comments) 09/17/2014   Quetiapine Other (See Comments) 03/01/2021   Quetiapine fumarate Other (See Comments) 11/22/2022    Family History  Problem Relation Age of Onset   Fibromyalgia Mother    Diabetes type II Mother    Depression Mother    Alzheimer's disease Mother    GER disease Mother    Heart disease Mother    Paranoid behavior Mother    Dementia Mother    Heart attack Father 67       MI x5   Stroke Father        CVA x7   Asthma Father    Brain cancer Father    Seizures Father    Anxiety disorder Sister    OCD Sister    Sexual abuse Sister    Physical abuse Sister    Anxiety disorder Sister    Fibromyalgia Sister    Other Sister        Temporomandibular Joint   ADD / ADHD Daughter    ADD / ADHD Daughter    Alcohol abuse Neg Hx    Drug abuse Neg Hx    Schizophrenia Neg Hx     Social History   Socioeconomic History   Marital status: Married    Spouse name: Not on file   Number of children: 2   Years of education: College   Highest education level: Associate degree: academic program  Occupational History   Occupation: Chiropractor: UNEMPLOYED    Comment: Now disabled due to bipolar and fibromyalgia  Tobacco Use   Smoking status: Former    Current packs/day: 0.00    Types: Cigarettes    Quit date: 11/04/2010    Years since quitting: 13.9    Passive exposure: Past   Smokeless tobacco: Never  Vaping Use   Vaping status: Never Used  Substance and Sexual Activity   Alcohol use: Yes    Comment: one mixed drink per month   Drug use: No   Sexual activity: Yes    Birth control/protection: Surgical  Other Topics Concern   Not on file  Social History Narrative   Married   Lives with spouse and daughter   Right handed.   Caffeine use: 2 cups coffee in morning and 2 cups tea during the day    Social Drivers of Health   Financial Resource Strain: Low Risk  (09/24/2024)   Overall Financial Resource Strain (CARDIA)     Difficulty of Paying Living Expenses: Not hard at all  Food Insecurity: No Food Insecurity (09/24/2024)   Hunger Vital Sign    Worried About Running Out of Food in the Last Year: Never true    Ran Out of Food in the Last Year: Never true  Transportation Needs: No Transportation Needs (09/24/2024)  PRAPARE - Administrator, Civil Service (Medical): No    Lack of Transportation (Non-Medical): No  Physical Activity: Inactive (09/24/2024)   Exercise Vital Sign    Days of Exercise per Week: 0 days    Minutes of Exercise per Session: 0 min  Stress: Stress Concern Present (09/24/2024)   [employer] of Occupational Health - Occupational Stress Questionnaire    Feeling of Stress: To some extent  Social Connections: Socially Integrated (09/24/2024)   Social Connection and Isolation Panel    Frequency of Communication with Friends and Family: More than three times a week    Frequency of Social Gatherings with Friends and Family: Twice a week    Attends Religious Services: More than 4 times per year    Active Member of [employer] or Organizations: Yes    Attends Banker Meetings: Never    Marital Status: Married  Catering Manager Violence: Not At Risk (09/24/2024)   Humiliation, Afraid, Rape, and Kick questionnaire    Fear of Current or Ex-Partner: No    Emotionally Abused: No    Physically Abused: No    Sexually Abused: No     Review of Systems   Gen: Denies any fever, chills, fatigue, weight loss, lack of appetite.  CV: Denies chest pain, heart palpitations, peripheral edema, syncope.  Resp: Denies shortness of breath at rest or with exertion. Denies wheezing or cough.  GI: Denies dysphagia or odynophagia. Denies jaundice, hematemesis, fecal incontinence. GU : Denies urinary burning, urinary frequency, urinary hesitancy MS: Denies joint pain, muscle weakness, cramps, or limitation of movement.  Derm: Denies rash, itching, dry skin Psych: Denies depression,  anxiety, memory loss, and confusion Heme: Denies bruising, bleeding, and enlarged lymph nodes.   Physical Exam   BP 134/81   Pulse 83   Temp 97.8 F (36.6 C)   Ht 5' 11 (1.803 m)   Wt 219 lb (99.3 kg)   LMP 01/04/2018   BMI 30.54 kg/m  General:   Alert and oriented. Pleasant and cooperative. Well-nourished and well-developed.  Head:  Normocephalic and atraumatic. Eyes:  Without icterus Abdomen:  +BS, soft, non-tender and non-distended. No HSM noted. No guarding or rebound. No masses appreciated.  Rectal:  Deferred  Msk:  Symmetrical without gross deformities. Normal posture. Extremities:  Without edema. Neurologic:  Alert and  oriented x4;  grossly normal neurologically. Skin:  rash noted well demarcated but only faint erythema, no raised lesions, no drainage or pruritus, reported on stomach and arm, chronic per patient and will deepen in color at times Psych:  Alert and cooperative. Normal mood and affect.   Assessment   Michelle Clements is a 58 y.o. 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   Alternating loose stools and constipation with predominant constipation historically, now trending towards diarrhea: diarrhea with Amitiza , Miralax  and Linzess not helpful, Ibsrela without improvement. Now noting moreso looser stool predominant and denying significant constipation. She will at times get constipated.Filmy stool, colonoscopy on file. Query if she has underlying bile salt diarrhea, possible EPI, etc.  No facial flushing, but does note daughter has carcinoid diagnosis with mets.   Documented unintentional weight loss: quite concerning. CT unrevealing. She has lost an additional 4 lbs since visit the visit 4 weeks ago. 9  lbs from  May 2025. She is concerned as her daughter has history of carcinoid tumor. Both colonoscopy and EGD this year on file. Will need to check TSH and will also check NET labs to be thorough. Celiac serologies negative. May need dedicated imaging with pancreatic protocol or if NET labs abnormal will need further evaluation.   New onset IDA: ferritin markedly low at 13 recently, sats 21%. Hgb 15 recently. EGD/colonoscopy on file. Arranging capsule study due to IDA and diarrhea.   Rash: chronic, see Derm in near future.    PLAN   Extensive labs, fecal cal, fecal elastase Capsule study due to IDA Further recommendations to follow  ADDENDUM: fecal elastase markedly low at 16. Fecal cal borderline at 64, celiac serologies negative,  somatostatin normal, VIP normal, 24 hour urine 5HIAA normal, TSH normal, gastrin mildly elevated at 233 on PPI. Will add fasting cortisol Keep plans for capsule study Interesting finding of markedly low fecal elastase. Seems out of proportion for presentation. With unintentional weight loss, consider dedicated pancreatic imaging. Will discuss further with Dr. Eartha.    Michelle MICAEL Stager, PhD, ANP-BC [facility]   I have reviewed the note and agree with the APP's assessment as described in this progress note   Unclear what to make about low elastase as patient has presented bouts of constipation and  not frequent diarrhea on a regular basis. Imaging was reviewed and pancreas looks normal, does not warrant more imaging. If diarrhea becomes a more pressing issues, may try pancreatic enzymes. Most likely symptoms are related to chronic IBS. Will follow capsule endoscopy.  Toribio Eartha, MD Gastroenterology and Hepatology [facility]

## 2024-10-17 NOTE — Patient Instructions (Addendum)
 Let's try Amitiza  24 mcg once every other day; please let me know how this does!  Please have blood work and stool tests completed. I also ordered a 24 hour urine test.   We are arranging a capsule study in the near future!  Further recommendations to follow!   I enjoyed seeing you again today! I value our relationship and want to provide genuine, compassionate, and quality care. You may receive a survey regarding your visit with me, and I welcome your feedback! Thanks so much for taking the time to complete this. I look forward to seeing you again.      Therisa MICAEL Stager, PhD, ANP-BC [facility]

## 2024-10-18 ENCOUNTER — Encounter: Payer: Self-pay | Admitting: Nurse Practitioner

## 2024-10-18 ENCOUNTER — Other Ambulatory Visit (HOSPITAL_COMMUNITY): Payer: Self-pay

## 2024-10-18 ENCOUNTER — Other Ambulatory Visit: Payer: Self-pay

## 2024-10-18 ENCOUNTER — Ambulatory Visit (INDEPENDENT_AMBULATORY_CARE_PROVIDER_SITE_OTHER): Admitting: Nurse Practitioner

## 2024-10-18 VITALS — BP 128/80 | HR 73 | Ht 71.0 in | Wt 218.0 lb

## 2024-10-18 DIAGNOSIS — N1831 Chronic kidney disease, stage 3a: Secondary | ICD-10-CM | POA: Diagnosis not present

## 2024-10-18 DIAGNOSIS — G894 Chronic pain syndrome: Secondary | ICD-10-CM

## 2024-10-18 DIAGNOSIS — M461 Sacroiliitis, not elsewhere classified: Secondary | ICD-10-CM

## 2024-10-18 DIAGNOSIS — Z794 Long term (current) use of insulin: Secondary | ICD-10-CM | POA: Diagnosis not present

## 2024-10-18 DIAGNOSIS — D801 Nonfamilial hypogammaglobulinemia: Secondary | ICD-10-CM | POA: Diagnosis not present

## 2024-10-18 DIAGNOSIS — E1165 Type 2 diabetes mellitus with hyperglycemia: Secondary | ICD-10-CM | POA: Diagnosis not present

## 2024-10-18 DIAGNOSIS — R634 Abnormal weight loss: Secondary | ICD-10-CM | POA: Diagnosis not present

## 2024-10-18 DIAGNOSIS — F3181 Bipolar II disorder: Secondary | ICD-10-CM | POA: Diagnosis not present

## 2024-10-18 DIAGNOSIS — R7689 Other specified abnormal immunological findings in serum: Secondary | ICD-10-CM | POA: Diagnosis not present

## 2024-10-18 DIAGNOSIS — R197 Diarrhea, unspecified: Secondary | ICD-10-CM | POA: Diagnosis not present

## 2024-10-18 DIAGNOSIS — M1612 Unilateral primary osteoarthritis, left hip: Secondary | ICD-10-CM

## 2024-10-18 DIAGNOSIS — F411 Generalized anxiety disorder: Secondary | ICD-10-CM | POA: Diagnosis not present

## 2024-10-18 LAB — POCT GLYCOSYLATED HEMOGLOBIN (HGB A1C): Hemoglobin A1C: 6.2 % — AB (ref 4.0–5.6)

## 2024-10-18 MED ORDER — FREESTYLE LIBRE 3 PLUS SENSOR MISC
3 refills | Status: AC
  Filled 2024-10-18: qty 6, fill #0
  Filled 2024-12-25: qty 6, 90d supply, fill #0

## 2024-10-18 NOTE — Progress Notes (Signed)
 Endocrinology Follow Up Visit       10/18/2024, 10:58 AM   Subjective:    Patient ID: Michelle Clements, female    DOB: Dec 28, 1965.  Michelle Clements is being seen in follow up after being seen in consultation for management of currently uncontrolled symptomatic diabetes requested by  Bevely Doffing, FNP.   Past Medical History:  Diagnosis Date   Allergic rhinitis    Anemia    Arthritis    Lower back and hips   Asthma    Bipolar disorder (HCC)    Bronchitis 05/2024   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HCC)    Constipation    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HCC)    Depression    Essential hypertension, benign    Fibromyalgia    GERD (gastroesophageal reflux disease)    H/O sleep apnea    Hip pain, left    History of palpitations    Negative Holter monitor   History of pneumonia 12/05/1986   Hyperlipidemia    IBS (irritable bowel syndrome)    Nausea and vomiting 02/16/2024   Neck pain    Obstructive sleep apnea (adult) (pediatric) 04/12/2021   Poor short term memory    PTSD (post-traumatic stress disorder)    S/P Botox injection 11/04/2014   For migraine headaches   Type 2 diabetes mellitus (HCC)    Urgency of urination     Past Surgical History:  Procedure Laterality Date   CARPAL TUNNEL RELEASE Right 06/10/2013   Procedure: CARPAL TUNNEL RELEASE;  Surgeon: Darina MALVA Boehringer, MD;  Location: [facility];  Service: Neurosurgery;  Laterality: Right;  Right Carpal Tunnel Release    CHOLECYSTECTOMY     COLONOSCOPY N/A 07/30/2024   Procedure: COLONOSCOPY;  Surgeon: Eartha Angelia Sieving, MD;  Location: [facility];  Service: Gastroenterology;  Laterality: N/A;  930am, asa 3   DENTAL SURGERY  12/05/2014   ESOPHAGOGASTRODUODENOSCOPY (EGD) WITH PROPOFOL  N/A 02/16/2024   Procedure: ESOPHAGOGASTRODUODENOSCOPY (EGD) WITH PROPOFOL ;  Surgeon: Eartha Angelia Sieving, MD;  Location: [facility];   Service: Gastroenterology;  Laterality: N/A;  11:30am;ASA 1   mole exc     x 3   MUSCLE BIOPSY  12/05/2006   Right leg   POLYPECTOMY  02/16/2024   Procedure: POLYPECTOMY;  Surgeon: Eartha Angelia Sieving, MD;  Location: [facility];  Service: Gastroenterology;;   HARLEY DILATION  02/16/2024   Procedure: EGD, WITH DILATION USING SAVARY-GILLIARD DILATOR OVER GUIDEWIRE;  Surgeon: Eartha Angelia Sieving, MD;  Location: [facility];  Service: Gastroenterology;;   TRIGGER FINGER RELEASE Right 03/2024   thumb   TUBAL LIGATION      Social History   Socioeconomic History   Marital status: Married    Spouse name: Not on file   Number of children: 2   Years of education: College   Highest education level: Associate degree: academic program  Occupational History   Occupation: Chiropractor: UNEMPLOYED    Comment: Now disabled due to bipolar and fibromyalgia  Tobacco Use   Smoking status: Former    Current packs/day: 0.00    Types: Cigarettes    Quit date: 11/04/2010    Years since  quitting: 13.9    Passive exposure: Past   Smokeless tobacco: Never  Vaping Use   Vaping status: Never Used  Substance and Sexual Activity   Alcohol use: Yes    Comment: one mixed drink per month   Drug use: No   Sexual activity: Yes    Birth control/protection: Surgical  Other Topics Concern   Not on file  Social History Narrative   Married   Lives with spouse and daughter   Right handed.   Caffeine use: 2 cups coffee in morning and 2 cups tea during the day    Social Drivers of Health   Financial Resource Strain: Low Risk  (09/24/2024)   Overall Financial Resource Strain (CARDIA)    Difficulty of Paying Living Expenses: Not hard at all  Food Insecurity: No Food Insecurity (09/24/2024)   Hunger Vital Sign    Worried About Running Out of Food in the Last Year: Never true    Ran Out of Food in the Last Year: Never true  Transportation Needs: No Transportation Needs  (09/24/2024)   PRAPARE - Administrator, Civil Service (Medical): No    Lack of Transportation (Non-Medical): No  Physical Activity: Inactive (09/24/2024)   Exercise Vital Sign    Days of Exercise per Week: 0 days    Minutes of Exercise per Session: 0 min  Stress: Stress Concern Present (09/24/2024)   [employer] of Occupational Health - Occupational Stress Questionnaire    Feeling of Stress: To some extent  Social Connections: Socially Integrated (09/24/2024)   Social Connection and Isolation Panel    Frequency of Communication with Friends and Family: More than three times a week    Frequency of Social Gatherings with Friends and Family: Twice a week    Attends Religious Services: More than 4 times per year    Active Member of [employer] or Organizations: Yes    Attends Banker Meetings: Never    Marital Status: Married    Family History  Problem Relation Age of Onset   Fibromyalgia Mother    Diabetes type II Mother    Depression Mother    Alzheimer's disease Mother    GER disease Mother    Heart disease Mother    Paranoid behavior Mother    Dementia Mother    Heart attack Father 51       MI x5   Stroke Father        CVA x7   Asthma Father    Brain cancer Father    Seizures Father    Anxiety disorder Sister    OCD Sister    Sexual abuse Sister    Physical abuse Sister    Anxiety disorder Sister    Fibromyalgia Sister    Other Sister        Temporomandibular Joint   ADD / ADHD Daughter    ADD / ADHD Daughter    Alcohol abuse Neg Hx    Drug abuse Neg Hx    Schizophrenia Neg Hx     Outpatient Encounter Medications as of 10/18/2024  Medication Sig   albuterol  (VENTOLIN  HFA) 108 (90 Base) MCG/ACT inhaler Inhale 2 puffs into the lungs every 6 (six) hours as needed for wheezing and/or shortness of breath.   aspirin EC 81 MG tablet Take 1 tablet every day by oral route.   azelastine  (ASTELIN ) 0.1 % nasal spray Place 1 spray into both  nostrils 2 (two) times daily.   Blood Glucose Monitoring  Suppl (ONETOUCH VERIO) w/Device KIT Use to check glucose once daily   buPROPion  (WELLBUTRIN  XL) 150 MG 24 hr tablet Take 1 tablet (150 mg total) by mouth every morning.   cetirizine  (ZYRTEC ) 10 MG tablet Take 10 mg by mouth at bedtime.   clonazePAM  (KLONOPIN ) 0.5 MG tablet Take 1 tablet (0.5 mg total) by mouth at bedtime.   Coenzyme Q10 (COQ10 PO) Take by mouth. One daily   cycloSPORINE  (RESTASIS ) 0.05 % ophthalmic emulsion Place 1 drop into both eyes 2 (two) times daily.   dapagliflozin  propanediol (FARXIGA ) 10 MG TABS tablet Take 1 tablet (10 mg total) by mouth in the morning.   estradiol  (ESTRACE ) 0.1 MG/GM vaginal cream Place 1 Applicatorful vaginally 3 (three) times a week.   FLUoxetine  (PROZAC ) 20 MG capsule Take 1 capsule (20 mg total) by mouth daily.   FLUoxetine  (PROZAC ) 40 MG capsule Take 1 capsule (40 mg total) by mouth daily.   fluticasone  (FLONASE ) 50 MCG/ACT nasal spray Place 1 spray into both nostrils daily.   Fluticasone -Umeclidin-Vilant (TRELEGY ELLIPTA ) 100-62.5-25 MCG/ACT AEPB Inhale 1 puff into the lungs.   Galcanezumab -gnlm (EMGALITY ) 120 MG/ML SOSY Inject 1 mL into the skin every 30 (thirty) days.   Immune Globulin , Human,-klhw (XEMBIFY ) 10 GM/50ML SOLN Inject 10 g into the skin every 7 (seven) days.   insulin  glargine (LANTUS ) 100 UNIT/ML Solostar Pen Inject 40 Units into the skin at bedtime. (Patient taking differently: Inject 30 Units into the skin at bedtime.)   ipratropium-albuterol  (DUONEB) 0.5-2.5 (3) MG/3ML SOLN Take 3 mLs by nebulization 2 (two) times daily as needed.   lamoTRIgine  (LAMICTAL ) 25 MG tablet Take 1 tablet (25 mg total) by mouth 2 (two) times daily.   Lancets (ONETOUCH ULTRASOFT) lancets Use to check blood sugar once daily   Latanoprostene Bunod  (VYZULTA ) 0.024 % SOLN Place 1 drop into both eyes at bedtime as directed.   methylphenidate (RITALIN) 10 MG tablet Take 1 tablet (10 mg total) by mouth 2  (two) times daily with breakfast and lunch.   omeprazole  (PRILOSEC) 20 MG capsule Take 1 capsule (20 mg total) by mouth 2 (two) times daily.   ondansetron  (ZOFRAN -ODT) 8 MG disintegrating tablet Dissolve 1 tablet (8 mg total) by mouth every 8 (eight) hours as needed for nausea or vomiting.   ONETOUCH VERIO test strip Use to check glucose once daily.   OVER THE COUNTER MEDICATION Magnessium 500 mg at night   pregabalin  (LYRICA ) 200 MG capsule Take 1 capsule (200 mg total) by mouth 3 (three) times daily.   tiZANidine  (ZANAFLEX ) 4 MG tablet Take 1 tablet (4 mg total) by mouth every 12 (twelve) hours as needed.   traMADol  (ULTRAM ) 50 MG tablet Take 1 tablet (50 mg total) by mouth every 6 (six) hours as needed.   traZODone  (DESYREL ) 50 MG tablet Take 1 tablet (50 mg total) by mouth at bedtime.   UNABLE TO FIND 1 each by Does not apply route daily. Med Name: CPAP SUPPLIES Full face mask Cushion for mask -Medium ResMed   VITAMIN D  PO Take 5,000 Units by mouth daily.   zinc gluconate 50 MG tablet Take 50 mg by mouth daily.   zolmitriptan  (ZOMIG ) 5 MG nasal solution Administer 5 mg into affected nostril(s) as needed (1 nasal spray at onset of severe headache; may repeat once after 2 hours if headache persists; max of 2 doses in 24 hours or 4/week.).   zonisamide  (ZONEGRAN ) 50 MG capsule Take 1 capsule by mouth at bedtime for 5 days  then increase to 2 capsules at bedtime thereafter.   [DISCONTINUED] Continuous Glucose Sensor (FREESTYLE LIBRE 3 PLUS SENSOR) MISC Change sensor every 15 days.   Continuous Glucose Sensor (FREESTYLE LIBRE 3 PLUS SENSOR) MISC Change sensor every 15 days.   rosuvastatin  (CRESTOR ) 40 MG tablet Take 1 tablet (40 mg total) by mouth at bedtime. (Patient not taking: Reported on 10/18/2024)   [DISCONTINUED] SUMAtriptan  (IMITREX ) 100 MG tablet Take 1 tablet by mouth as needed for migraine. If migraine continues, may repeat dose after 2 hours.   No facility-administered encounter  medications on file as of 10/18/2024.    ALLERGIES: Allergies  Allergen Reactions   Dust Mite Extract Shortness Of Breath, Other (See Comments) and Cough    Wheezing     Gramineae Pollens Shortness Of Breath    wheezing   Misc. Sulfonamide Containing Compounds Anaphylaxis   Molds & Smuts Shortness Of Breath    wheezing     Other Anaphylaxis, Other (See Comments) and Shortness Of Breath    : Angioedema  wheezing  Wheezing  Other reaction(s): Cough  Itching and rash  Other reaction(s): Other (See Comments)   : Angioedema   Statins Anaphylaxis    Patient states she is currently on a Statin    Sulfa Antibiotics Anaphylaxis and Other (See Comments)    : Angioedema  : Angioedema  : Angioedema     : Angioedema    : Angioedema  : Angioedema   : Angioedema,  : Angioedema     : Angioedema,  : Angioedema   Sulfamethoxazole-Trimethoprim Anaphylaxis and Other (See Comments)   Trichophyton Shortness Of Breath    wheezing     Carbamazepine  Rash, Dermatitis and Other (See Comments)    Other Reaction(s): Other (See Comments)   Gluten Meal Itching and Other (See Comments)    Itching and rash Other reaction(s): Abdominal Pain    Quetiapine Other (See Comments)   Quetiapine Fumarate Other (See Comments)    VACCINATION STATUS: Immunization History  Administered Date(s) Administered   Fluzone Influenza virus vaccine,trivalent (IIV3), split virus 09/24/2009   Influenza Whole 10/03/2007   Influenza,inj,Quad PF,6+ Mos 10/03/2018, 09/01/2021   Influenza,inj,quad, With Preservative 09/26/2019   Influenza-Unspecified 09/18/2014, 09/23/2015, 09/04/2017   Novel Infuenza-h1n1-09 01/26/2009   Pneumococcal Polysaccharide-23 10/14/2012   Td 01/21/2019    Diabetes She presents for her follow-up diabetic visit. She has type 2 diabetes mellitus. Onset time: She was diagnosed at approximate age of 47. Her disease course has been stable. There are no hypoglycemic associated symptoms.  Pertinent negatives for diabetes include no blurred vision, no fatigue, no polydipsia, no polyphagia and no polyuria. There are no hypoglycemic complications. Symptoms are stable. Diabetic complications include nephropathy and retinopathy. Risk factors for coronary artery disease include diabetes mellitus, dyslipidemia, obesity and sedentary lifestyle. Current diabetic treatment includes insulin  injections and oral agent (monotherapy). She is compliant with treatment all of the time. Her weight is fluctuating minimally. She is following a generally healthy diet. Meal planning includes avoidance of concentrated sweets. She has not had a previous visit with a dietitian. She participates in exercise intermittently. Her home blood glucose trend is fluctuating minimally. Her overall blood glucose range is 110-130 mg/dl. (She presents today with her CGM and logs showing at goal glycemic profile overall.  Her POCT A1c today is 6.2%, increasing from last visit of 5.8% but still at goal.  Analysis of her CGM shows TIR 98%, TAR 1%, TBR 1% with a GMI of 6.1%.  She denies any  significant hypoglycemia.) An ACE inhibitor/angiotensin II receptor blocker is not being taken. She does not see a podiatrist.Eye exam is current.    Review of systems  Constitutional: + steadily decreasing body weight-unintentional,  current Body mass index is 30.4 kg/m. , no fatigue, no subjective hyperthermia, no subjective hypothermia Eyes: no blurry vision, no xerophthalmia ENT: no sore throat, no nodules palpated in throat, no dysphagia/odynophagia, no hoarseness Cardiovascular: no chest pain, no shortness of breath, no palpitations, no leg swelling Respiratory: no cough, no shortness of breath Gastrointestinal: no nausea/vomiting/diarrhea Musculoskeletal: no muscle/joint aches Skin: no rashes, no hyperemia Neurological: no tremors, no numbness, no tingling, no dizziness Psychiatric: no depression, no anxiety, hx bipolar d/o-  controlled on meds   Objective:    BP 128/80 (BP Location: Right Arm, Patient Position: Sitting, Cuff Size: Large)   Pulse 73   Ht 5' 11 (1.803 m)   Wt 218 lb (98.9 kg)   LMP 01/04/2018   BMI 30.40 kg/m   Wt Readings from Last 3 Encounters:  10/18/24 218 lb (98.9 kg)  10/17/24 219 lb (99.3 kg)  09/25/24 223 lb 4.8 oz (101.3 kg)    BP Readings from Last 3 Encounters:  10/18/24 128/80  10/17/24 134/81  09/25/24 111/66     Physical Exam- Limited  Constitutional:  Body mass index is 30.4 kg/m. , not in acute distress, normal state of mind Eyes:  EOMI, no exophthalmos Musculoskeletal: no gross deformities, strength intact in all four extremities, no gross restriction of joint movements Skin:  no rashes, no hyperemia Neurological: no tremor with outstretched hands   Diabetic Foot Exam - Simple   No data filed    CMP ( most recent) CMP     Component Value Date/Time   NA 139 08/02/2024 1112   NA 140 07/09/2024 1431   K 4.2 08/02/2024 1112   CL 104 08/02/2024 1112   CO2 23 08/02/2024 1112   GLUCOSE 96 08/02/2024 1112   BUN 14 08/02/2024 1112   BUN 22 07/09/2024 1431   CREATININE 1.20 (H) 09/23/2024 1318   CREATININE 0.88 02/21/2012 1153   CALCIUM  9.6 08/02/2024 1112   PROT 7.3 07/09/2024 1431   ALBUMIN 5.1 (H) 07/09/2024 1431   AST 36 07/09/2024 1431   ALT 50 (H) 07/09/2024 1431   ALKPHOS 99 07/09/2024 1431   BILITOT 0.4 07/09/2024 1431   GFRNONAA 58 (L) 08/02/2024 1112   GFRAA 50 12/29/2020 0000     Diabetic Labs (most recent): Lab Results  Component Value Date   HGBA1C 6.2 (A) 10/18/2024   HGBA1C 5.8 (A) 06/14/2024   HGBA1C 6.2 (A) 11/07/2023   MICROALBUR 26 03/31/2022   MICROALBUR 150 10/22/2020     Lipid Panel ( most recent) Lipid Panel     Component Value Date/Time   CHOL 165 12/05/2023 1058   TRIG 165 (H) 12/05/2023 1058   HDL 49 12/05/2023 1058   CHOLHDL 3.4 12/05/2023 1058   CHOLHDL 4.2 03/18/2015 0915   VLDL 44 (H) 03/18/2015 0915    LDLCALC 88 12/05/2023 1058   LABVLDL 28 12/05/2023 1058      Lab Results  Component Value Date   TSH 1.500 03/05/2024   TSH 1.030 10/27/2023   TSH 1.920 02/03/2023   TSH 2.69 12/29/2020   TSH 1.66 09/14/2020   TSH 2.770 08/29/2019   TSH 3.261 10/14/2012   TSH 2.651 02/21/2012   TSH 1.981 03/06/2009   TSH 3.250 01/23/2008   FREET4 1.14 03/05/2024   FREET4 1.14 10/27/2023  FREET4 1.05 02/03/2023           Assessment & Plan:   1) Type 2 diabetes mellitus with hyperglycemia, with long-term current use of insulin  (HCC)  - Michelle Clements has currently uncontrolled symptomatic type 2 DM since 58 years of age.  She presents today with her CGM and logs showing at goal glycemic profile overall.  Her POCT A1c today is 6.2%, increasing from last visit of 5.8% but still at goal.  Analysis of her CGM shows TIR 98%, TAR 1%, TBR 1% with a GMI of 6.1%.  She denies any significant hypoglycemia.   Recent labs reviewed, showing stable CKD stage 3.    - I had a long discussion with her about the progressive nature of diabetes and the pathology behind its complications. -her diabetes is complicated by sleep apnea, HLD, retinopathy and she remains at a high risk for more acute and chronic complications which include CAD, CVA, CKD, retinopathy, and neuropathy. These are all discussed in detail with her.  - Nutritional counseling repeated/built upon at each appointment.  - The patient admits there is a room for improvement in their diet and drink choices. -  Suggestion is made for the patient to avoid simple carbohydrates from their diet including Cakes, Sweet Desserts / Pastries, Ice Cream, Soda (diet and regular), Sweet Tea, Candies, Chips, Cookies, Sweet Pastries, Store Bought Juices, Alcohol in Excess of 1-2 drinks a day, Artificial Sweeteners, Coffee Creamer, and Sugar-free Products. This will help patient to have stable blood glucose profile and potentially avoid unintended weight gain.   -  I encouraged the patient to switch to unprocessed or minimally processed complex starch and increased protein intake (animal or plant source), fruits, and vegetables.   - Patient is advised to stick to a routine mealtimes to eat 3 meals a day and avoid unnecessary snacks (to snack only to correct hypoglycemia).  - she is following with Santana Duke, RDE for diabetes education.  - I have approached her with the following individualized plan to manage  her diabetes and patient agrees:   -She is advised to lower her Lantus  to 15 units SQ nightly and can continue Farxiga  10 mg po daily.  -She is encouraged to continue monitoring blood glucose twice daily, before breakfast and before bed, and to call the clinic if she has readings less than 70 or greater than 200 for 3 tests in a row.  - Specific targets for  A1c;  LDL, HDL,  and Triglycerides were discussed with the patient.  2) Blood Pressure /Hypertension:   her blood pressure is controlled to target without the use of antihypertensive medications.  She is advised to continue Lisinopril 5 mg po daily with breakfast.   3) Lipids/Hyperlipidemia:    Her most recent lipid panel from 07/25/23 shows controlled LDL of 87.  She is advised to continue her Crestor  20 mg po daily at bedtime.  Side effects and precautions discussed with her.  4) Weight/Diet:  Her Body mass index is 30.4 kg/m.  -  clearly complicating her diabetes care.  She has lost approx 11 lbs and is advised to keep it up!  she is a candidate for weight loss. I discussed with her the fact that loss of 5 - 10% of her  current body weight will have the most impact on her diabetes management.  Exercise, and detailed carbohydrates information provided  -  detailed on discharge instructions.  5) Vitamin D  deficiency Her most recent vitamin D   level was 73.7 on 10/27/23.  She is currently on OTC vitamin D3 2000 units daily.  She is advised to continue supplementation at this time, especially  during the winter months.  6) Chronic Care/Health Maintenance: -she is on Statin medications and is encouraged to initiate and continue to follow up with Ophthalmology, Dentist,  Podiatrist at least yearly or according to recommendations, and advised to stay away from smoking. I have recommended yearly flu vaccine and pneumonia vaccine at least every 5 years; moderate intensity exercise for up to 150 minutes weekly; and  sleep for at least 7 hours a day.  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  And her thyroid  is consistent with euthyroid presentation at this time.  She does not need antithyroid treatment or thyroid  hormone replacement at this time.  Will recheck labs prior to next visit for surveillance.  Patient notes she had labs done for another provider this morning.  Will see if we can add the thyroid  labs to that blood sample.  Will call her with the results.  8) Empty sella Recent imaging of the brain showed incidental empty sella.  Will add Prolactin level to assess further- this came back normal.    - she is advised to maintain close follow up with Bevely Doffing, FNP for primary care needs, as well as her other providers for optimal and coordinated care.      I spent  38  minutes in the care of the patient today including review of labs from CMP, Lipids, Thyroid  Function, Hematology (current and previous including abstractions from other facilities); face-to-face time discussing  her blood glucose readings/logs, discussing hypoglycemia and hyperglycemia episodes and symptoms, medications doses, her options of short and long term treatment based on the latest standards of care / guidelines;  discussion about incorporating lifestyle medicine;  and documenting the encounter. Risk reduction counseling performed per USPSTF guidelines to reduce obesity and  cardiovascular risk factors.     Please refer to Patient Instructions for Blood Glucose Monitoring and Insulin /Medications Dosing Guide  in media tab for additional information. Please  also refer to  Patient Self Inventory in the Media  tab for reviewed elements of pertinent patient history.  Michelle Clements participated in the discussions, expressed understanding, and voiced agreement with the above plans.  All questions were answered to her satisfaction. she is encouraged to contact clinic should she have any questions or concerns prior to her return visit.   Follow up plan: - Return in about 4 months (around 02/15/2025) for Diabetes F/U with A1c in office, Thyroid  follow up, Clements meter and logs.  Benton Rio, [facility] [address] [place], [place] [address] Phone: [phone] Fax: [phone]  10/18/2024, 10:58 AM

## 2024-10-19 ENCOUNTER — Other Ambulatory Visit: Payer: Self-pay

## 2024-10-19 LAB — COMPREHENSIVE METABOLIC PANEL WITH GFR
ALT: 34 IU/L — ABNORMAL HIGH (ref 0–32)
AST: 27 IU/L (ref 0–40)
Albumin: 4.6 g/dL (ref 3.8–4.9)
Alkaline Phosphatase: 87 IU/L (ref 49–135)
BUN/Creatinine Ratio: 14 (ref 9–23)
BUN: 17 mg/dL (ref 6–24)
Bilirubin Total: 0.4 mg/dL (ref 0.0–1.2)
CO2: 22 mmol/L (ref 20–29)
Calcium: 10.8 mg/dL — ABNORMAL HIGH (ref 8.7–10.2)
Chloride: 103 mmol/L (ref 96–106)
Creatinine, Ser: 1.25 mg/dL — ABNORMAL HIGH (ref 0.57–1.00)
Globulin, Total: 1.8 g/dL (ref 1.5–4.5)
Glucose: 112 mg/dL — ABNORMAL HIGH (ref 70–99)
Potassium: 5.9 mmol/L — ABNORMAL HIGH (ref 3.5–5.2)
Sodium: 140 mmol/L (ref 134–144)
Total Protein: 6.4 g/dL (ref 6.0–8.5)
eGFR: 51 mL/min/1.73 — ABNORMAL LOW (ref >59)

## 2024-10-19 LAB — T4, FREE: Free T4: 0.97 ng/dL (ref 0.82–1.77)

## 2024-10-19 LAB — TSH: TSH: 1.77 u[IU]/mL (ref 0.450–4.500)

## 2024-10-21 ENCOUNTER — Ambulatory Visit: Payer: Self-pay | Admitting: Nurse Practitioner

## 2024-10-21 ENCOUNTER — Encounter: Payer: Self-pay | Admitting: *Deleted

## 2024-10-21 ENCOUNTER — Other Ambulatory Visit: Payer: Self-pay

## 2024-10-21 DIAGNOSIS — R197 Diarrhea, unspecified: Secondary | ICD-10-CM | POA: Diagnosis not present

## 2024-10-21 NOTE — Progress Notes (Signed)
 FYI: I sent mychart message going over thyroid labs.

## 2024-10-21 NOTE — Progress Notes (Signed)
 Noted and the patient has sent a Thank You to [place].

## 2024-10-22 ENCOUNTER — Other Ambulatory Visit (HOSPITAL_COMMUNITY): Payer: Self-pay

## 2024-10-22 ENCOUNTER — Other Ambulatory Visit: Payer: Self-pay

## 2024-10-23 ENCOUNTER — Inpatient Hospital Stay: Admitting: Oncology

## 2024-10-23 DIAGNOSIS — R197 Diarrhea, unspecified: Secondary | ICD-10-CM | POA: Insufficient documentation

## 2024-10-23 DIAGNOSIS — F3181 Bipolar II disorder: Secondary | ICD-10-CM | POA: Diagnosis not present

## 2024-10-23 DIAGNOSIS — G43711 Chronic migraine without aura, intractable, with status migrainosus: Secondary | ICD-10-CM | POA: Diagnosis not present

## 2024-10-23 DIAGNOSIS — F319 Bipolar disorder, unspecified: Secondary | ICD-10-CM | POA: Diagnosis not present

## 2024-10-23 DIAGNOSIS — F411 Generalized anxiety disorder: Secondary | ICD-10-CM | POA: Diagnosis not present

## 2024-10-23 DIAGNOSIS — G4733 Obstructive sleep apnea (adult) (pediatric): Secondary | ICD-10-CM | POA: Diagnosis not present

## 2024-10-23 DIAGNOSIS — T50902D Poisoning by unspecified drugs, medicaments and biological substances, intentional self-harm, subsequent encounter: Secondary | ICD-10-CM | POA: Diagnosis not present

## 2024-10-25 ENCOUNTER — Other Ambulatory Visit (HOSPITAL_COMMUNITY): Payer: Self-pay

## 2024-10-25 ENCOUNTER — Other Ambulatory Visit: Payer: Self-pay

## 2024-10-25 LAB — 5 HIAA, QUANTITATIVE, URINE, 24 HOUR
5-HIAA, Ur: 2 mg/L
5-HIAA,Quant.,24 Hr Urine: 5 mg/(24.h) (ref 0.0–14.9)

## 2024-10-25 LAB — CALPROTECTIN, FECAL: Calprotectin, Fecal: 64 ug/g (ref 0–120)

## 2024-10-25 LAB — PANCREATIC ELASTASE, FECAL: Pancreatic Elastase, Fecal: 16 ug Elast./g — ABNORMAL LOW (ref >200)

## 2024-10-25 MED ORDER — PREDNISOLONE ACETATE 1 % OP SUSP
1.0000 [drp] | Freq: Four times a day (QID) | OPHTHALMIC | 1 refills | Status: AC
  Filled 2024-10-25: qty 5, 25d supply, fill #0
  Filled 2024-11-12: qty 5, 25d supply, fill #1

## 2024-10-25 MED ORDER — KETOROLAC TROMETHAMINE 0.5 % OP SOLN
1.0000 [drp] | Freq: Two times a day (BID) | OPHTHALMIC | 1 refills | Status: AC
  Filled 2024-10-25: qty 5, 50d supply, fill #0
  Filled 2024-12-07: qty 5, 50d supply, fill #1

## 2024-10-25 MED ORDER — GATIFLOXACIN 0.5 % OP SOLN
1.0000 [drp] | Freq: Four times a day (QID) | OPHTHALMIC | 1 refills | Status: AC
  Filled 2024-10-25 (×2): qty 5, 25d supply, fill #0
  Filled 2024-11-12: qty 5, 25d supply, fill #1

## 2024-10-28 DIAGNOSIS — I781 Nevus, non-neoplastic: Secondary | ICD-10-CM | POA: Diagnosis not present

## 2024-10-28 DIAGNOSIS — D485 Neoplasm of uncertain behavior of skin: Secondary | ICD-10-CM | POA: Diagnosis not present

## 2024-10-28 DIAGNOSIS — B360 Pityriasis versicolor: Secondary | ICD-10-CM | POA: Diagnosis not present

## 2024-10-28 DIAGNOSIS — L570 Actinic keratosis: Secondary | ICD-10-CM | POA: Diagnosis not present

## 2024-10-28 DIAGNOSIS — L814 Other melanin hyperpigmentation: Secondary | ICD-10-CM | POA: Diagnosis not present

## 2024-10-29 ENCOUNTER — Other Ambulatory Visit: Payer: Self-pay

## 2024-10-29 ENCOUNTER — Inpatient Hospital Stay: Attending: Physician Assistant | Admitting: Physician Assistant

## 2024-10-29 ENCOUNTER — Other Ambulatory Visit (HOSPITAL_COMMUNITY): Payer: Self-pay

## 2024-10-29 ENCOUNTER — Other Ambulatory Visit (HOSPITAL_COMMUNITY): Payer: Self-pay | Admitting: Psychiatry

## 2024-10-29 ENCOUNTER — Inpatient Hospital Stay

## 2024-10-29 VITALS — BP 121/77 | HR 70 | Temp 99.4°F | Resp 18 | Ht 71.0 in | Wt 220.0 lb

## 2024-10-29 DIAGNOSIS — D801 Nonfamilial hypogammaglobulinemia: Secondary | ICD-10-CM | POA: Insufficient documentation

## 2024-10-29 DIAGNOSIS — Z23 Encounter for immunization: Secondary | ICD-10-CM | POA: Diagnosis not present

## 2024-10-29 DIAGNOSIS — E611 Iron deficiency: Secondary | ICD-10-CM | POA: Diagnosis not present

## 2024-10-29 DIAGNOSIS — D751 Secondary polycythemia: Secondary | ICD-10-CM | POA: Diagnosis not present

## 2024-10-29 DIAGNOSIS — D582 Other hemoglobinopathies: Secondary | ICD-10-CM | POA: Diagnosis not present

## 2024-10-29 DIAGNOSIS — D839 Common variable immunodeficiency, unspecified: Secondary | ICD-10-CM | POA: Diagnosis not present

## 2024-10-29 MED ORDER — OMEPRAZOLE 20 MG PO CPDR
20.0000 mg | DELAYED_RELEASE_CAPSULE | Freq: Two times a day (BID) | ORAL | 2 refills | Status: AC
  Filled 2024-11-05: qty 60, 30d supply, fill #0
  Filled 2024-12-09: qty 60, 30d supply, fill #1
  Filled 2025-01-08: qty 60, 30d supply, fill #2

## 2024-10-29 MED ORDER — INFLUENZA VIRUS VACC SPLIT PF (FLUZONE) 0.5 ML IM SUSY
0.5000 mL | PREFILLED_SYRINGE | Freq: Once | INTRAMUSCULAR | Status: AC
  Administered 2024-10-29: 0.5 mL via INTRAMUSCULAR
  Filled 2024-10-29: qty 0.5

## 2024-10-29 MED ORDER — BUPROPION HCL ER (XL) 150 MG PO TB24
150.0000 mg | ORAL_TABLET | Freq: Every morning | ORAL | 2 refills | Status: DC
  Filled 2024-11-05: qty 30, 30d supply, fill #0

## 2024-10-29 NOTE — Patient Instructions (Signed)
 [facility] at [facility] **VISIT SUMMARY & IMPORTANT INSTRUCTIONS **   You were seen today by Pleasant Barefoot PA-C for your hypogammaglobulinemia (immunoglobulin deficiency).    SUBCUTANEOUS IgG We will continue your subcutaneous IgG (Xembify ) at the current dose. Overall, you seem to be tolerating your Xembify  better than the IVIG infusions. Common side effects of Xembify  include headache, fatigue, nausea, diarrhea, abdominal pain, injection site reactions, and fever. Risks associated with Xembify  include risk of kidney injury and renal failure, as well as the risk of blood clots in your legs or your lungs (DVT or PE). Make sure that you drink plenty of water to maintain excellent hydration while you are taking Xembify ! We are going to discontinue your monthly labs, but will still need you to get trough labs prior to your 65-month follow-up visit.  Make sure that this is done the day before you are due for your Xembify  infusion, at least 1 to 2 weeks before your office visit. We will refer you to Immunology for a second opinion and additional expertise.  ELEVATED HEMOGLOBIN This is most likely a secondary elevation in your hemoglobin, possibly caused by insufficiently treated sleep apnea or by dehydration. Since one of your medications (Farxiga ) can cause dehydration, make sure that you are drinking at least 80 ounces of water daily. If you continue to have worsening elevation in your hemoglobin/hematocrit, we will consider checking additional testing at future visits.  LOW IRON You have mild iron deficiency, which could be contributing to some of your fatigue. Start taking iron tablet every other day. Take over-the-counter ferrous sulfate 325 mg (65 mg elemental iron) every other day. Take your iron with food to decrease stomach upset. Take your iron pill with a glass of orange juice to help your body absorb it better. If you take any antacid or acid reflux  medicine at home, take your iron at a different time of day, separated by at least 2 hours from your antacid medication. If you continue to have iron deficiency despite taking iron pill, we will consider using IV iron to improve your iron and energy levels.   FOLLOW-UP APPOINTMENT: Office visit in 3 months  ** Thank you for trusting me with your healthcare!  I strive to provide all of my patients with quality care at each visit.  If you receive a survey for this visit, I would be so grateful to you for taking the time to provide feedback.  Thank you in advance!  ~ Imaad Reuss                                        Dr. Mickiel Davonna Pleasant Barefoot, PA-C       Delon Hope, NP    Thank you for choosing [facility] at [facility] to provide your oncology and hematology care.  To afford each patient quality time with our provider, please arrive at least 15 minutes before your scheduled appointment time.   If you have a lab appointment with the Cancer Center please come in thru the Main Entrance and check in at the main information desk.  You need to re-schedule your appointment should you arrive 10 or more minutes late.  We strive to give you quality time with our providers, and arriving late affects you and other patients whose appointments are after yours.  Also,  if you no show three or more times for appointments you may be dismissed from the clinic at the providers discretion.     Again, thank you for choosing [facility].  Our hope is that these requests will decrease the amount of time that you wait before being seen by our physicians.       _____________________________________________________________  Should you have questions after your visit to [facility], please contact our office at [phone] and follow the prompts.  Our office hours are 8:00 a.m. and 4:30 p.m. Monday - Friday.  Please note that voicemails left after 4:00 p.m.  may not be returned until the following business day.  We are closed weekends and major holidays.  You do have access to a nurse 24-7, just call the main number to the clinic [phone] and do not press any options, hold on the line and a nurse will answer the phone.    For prescription refill requests, have your pharmacy contact our office and allow 72 hours.

## 2024-10-29 NOTE — Progress Notes (Signed)
 [facility] [address], [place] [address]   CLINIC:  Medical Oncology/Hematology  PCP:  Bevely Doffing, FNP [address] [place] [address] [phone]   REASON FOR VISIT:  Follow-up for hypogammaglobulinemia  PRIOR THERAPY: Monthly IVIG  CURRENT THERAPY: Subcutaneous IgG (Xembify ) every 2 weeks  INTERVAL HISTORY:   Michelle Clements 58 y.o. female returns for routine follow-up of hypogammaglobulinemia.   She was last seen by NP Delon Hope on 07/18/2024.    At today's visit, she reports feeling fair.   She continues to have some ongoing symptoms related to her fibromyalgia and other conditions - including body aches, headaches, and fatigue.   She is following with Dr. Jeannetta (rheumatology) and Dr. Marcelino (nephrology for CKD stage IIIa). She is seeing gastroenterology and being worked up for pancreatic insufficiency.   She continues to tolerate subcutaneous Xembify  (every 2 weeks) much better than she was previously tolerating IVIG.  She denies any major injection site reactions.   She has not had the severe weakness that she was experiencing after her IVIG.   She does sometimes have about 2-3 days of malaise following her Xembify  infusions, but for the most part she does not have any noticeable adverse effects.  She denies any bacterial infections or antibiotics within the past 3 months.   She reports fungal rash, prescribed fluconazole weekly x4 weeks by her dermatologist. She denies any unexplained fevers, chills, night sweats, or weight loss.  She does have some diaphoretic episodes during the day, associated with nausea and feeling hot.  She has 60% energy and 100% appetite.   She is maintaining a stable weight.   ASSESSMENT & PLAN:  1.  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CVID) - Labs from October 2015 showed IgG 566 ([id number]), IgM 28 (57-237), IgA 65, IgE 12 - Flow cytometry from 2015 was negative for any monoclonal B-cell  population or abnormal T-cell phenotype - SPEP and immunofixation from 2017 were negative for any evidence of monoclonal protein. - Evaluated by [facility] by Dr. Bernard (10/27/2014), who noted:  A review of Ms. Burright's immune evaluation reveals mild hypogammaglobulinemia (not significant), a low IgM but normal specific antibody titers, normal B cell subsets panels and normal cellular function. Taken together the immune evaluation is normal. I do not believe Ms. Matson has a functional disorder of her immune system. For the hypo-IgM this may become significant if the IgM was less than 15. For this I suggest she have an annual IgG, IgM and IgA. Currently the IgM level is not likely to be significant. - Patient has also been evaluated by rheumatology.  Last seen by Dr. Jeannetta on 09/25/2024 - diagnosed with fibromyalgia many years ago, and recent testing showed positive ANA.  Musculoskeletal pain and weakness with possible connective tissue disorder and possible inflammatory myopathy or muscular dystrophy - Prior to starting IVIG, patient noted recurrent infections, approximately 10 per year, consisting of recurrent UTIs and sinus infection - Due to her modest hypogammaglobulinemia in the presence of recurrent infections, she was restarted on IVIG by Dr. Federico, with first dose on 11/03/2021 - No infections or antibiotics over the past 3 months.  No B symptoms. - She reported increasing weakness, fatigue, nausea, and myalgia/arthralgia after IVIG infusions (Gamunex-C 40 g/400 mL q 4 weeks). - Switched to subcutaneous IgG (Xembify  10g/15 mL SQ weekly) in June 2024, which she is tolerating better, although she does continue to have side effects of fatigue, headache, and  diarrhea - Dose decreased to Xembify  10g/15 mL SQ every 2 weeks as of November 2024 to decrease side effects - decreased frequency has still bene adequate to maintain target IgG levels.    - Most recent labs   (10/18/2024): Immunoglobulin troughs via [facility]: IgG remains therapeutic at 606.  IgA (34) IgM (9) remain low, more or less at baseline. Normal CBC/D, apart from mild erythrocytosis addressed below. CMP with baseline CKD stage IIIa (creatinine 1.25/GFR 51).  Mild elevation in ALT at 34.   - PLAN: Patient inquired regarding trial of discontinuing her SQIG.   Discussed that most patients with CVID should remain on intravenous immunoglobulin therapy for lifetime, to minimize frequency of infections and manage condition effectively.   - Patient has not been seen by immunology since 2015, at which time her immunoglobulin deficiencies were not quite as pronounced.  I have recommended immunology evaluation to ensure that she is receiving best course of treatment for her CVID.   If they feel that she can safely discontinue SQIG, then we will do what we can to facilitate this. - For the time being though, we will continue with her current plan... Check trough labs in 3 months (day before Xembify  is given, time to to be done 1 to 2 weeks prior to office visit) to include quantitative immunoglobulins, CBC/D, and CMP.  (Given overall stability, we will discontinue monthly trough labs at this time) Therapeutic goal is IgG around 600 Office visit in 3 months - Educational information regarding common side effects and risks has been provided to patient.  We discussed the importance of hydration due to risk of kidney injury.   2.  Elevated hemoglobin (HCC) - Labs from 10/18/2024 show Hgb 16.0/hematocrit 48.0 - Hemoglobin intermittently elevated over the past few years - Possible causes of secondary polycythemia include sleep apnea (in process of getting fitted for new mask and new CPAP machine) and dehydration (currently on Farxiga , also admits to not drinking as much water as she should). - Of note, patient recently had extensive genetic testing done by third-party.  Although I was unable to view specific  mutations that were tested for, there did not appear to be any results that flagged for MPN. - PLAN: Continue follow-up with sleep medicine for new CPAP/mask.  Increase water intake. -If any significant worsening in hemoglobin/hematocrit in the future, consider checking erythropoietin and JAK2 with reflex to rule out MPN.  3.  Hypercalcemia, mild - She has mild intermittent elevations in calcium   4.  Iron deficiency - Labs checked by rheumatologist (09/25/2024) showed low ferritin at 13  - She denies any obvious bleeding - She is being seen by gastroenterology for various symptoms, may have some malabsorption - PLAN: Recommend starting OTC iron pill (Ferrous Sulfate) EOD , take stool softener PRN.  Take with food at a different time of day than Prilosec. - If she has ongoing iron deficiency despite oral iron supplementation, we will consider IV iron repletion.  She would probably benefit from premeds due to underlying autoimmune issues.  5.  Other history - Her past medical history is otherwise notable for fatty liver disease with history of transaminitis, GERD, fibromyalgia, type 2 diabetes mellitus, and IBS - CKD stage IIIa, follows with Dr. Lazarus  PLAN SUMMARY:  >> Continue Xembify  every 2 weeks at home >> Referral entered to Immunology >> TROUGH labs to be obtained at [facility] in 3 months = quantitative immunoglobulins, CBC/D, CMP, ferritin, iron/TIBC >> OFFICE visit in 3 months  REVIEW OF SYSTEMS:   Review of Systems  Constitutional:  Positive for fatigue. Negative for appetite change, chills, diaphoresis, fever and unexpected weight change.  HENT:   Negative for lump/mass, nosebleeds and trouble swallowing.   Eyes:  Negative for eye problems.  Respiratory:  Positive for shortness of breath. Negative for chest tightness, cough, hemoptysis and wheezing.   Cardiovascular:  Negative for chest pain, leg swelling and palpitations.  Gastrointestinal:  Positive for diarrhea (IBS) and  nausea. Negative for abdominal pain, blood in stool, constipation (IBS) and vomiting.  Genitourinary:  Negative for hematuria.   Musculoskeletal:  Positive for arthralgias, back pain and myalgias. Negative for neck pain.  Skin: Negative.   Neurological:  Positive for dizziness, headaches (daily) and numbness. Negative for light-headedness.  Hematological:  Does not bruise/bleed easily.  Psychiatric/Behavioral:  Positive for sleep disturbance. Negative for depression. The patient is not nervous/anxious.      PHYSICAL EXAM:  ECOG PERFORMANCE STATUS: 1 - Symptomatic but completely ambulatory  Vitals:   10/29/24 1420  BP: 121/77  Pulse: 70  Resp: 18  Temp: 99.4 F (37.4 C)  SpO2: 95%    Filed Weights   10/29/24 1420  Weight: 220 lb (99.8 kg)    Physical Exam Constitutional:      Appearance: Normal appearance. She is obese.  Cardiovascular:     Heart sounds: Normal heart sounds.  Pulmonary:     Breath sounds: Normal breath sounds.  Neurological:     General: No focal deficit present.     Mental Status: Mental status is at baseline.  Psychiatric:        Behavior: Behavior normal. Behavior is cooperative.    PAST MEDICAL/SURGICAL HISTORY:  Past Medical History:  Diagnosis Date   Allergic rhinitis    Anemia    Arthritis    Lower back and hips   Asthma    Bipolar disorder (HCC)    Bronchitis 05/2024   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HCC)    Constipation    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HCC)    Depression    Essential hypertension, benign    Fibromyalgia    GERD (gastroesophageal reflux disease)    H/O sleep apnea    Hip pain, left    History of palpitations    Negative Holter monitor   History of pneumonia 12/05/1986   Hyperlipidemia    IBS (irritable bowel syndrome)    Nausea and vomiting 02/16/2024   Neck pain    Obstructive sleep apnea (adult) (pediatric) 04/12/2021   Poor short term memory    PTSD (post-traumatic stress disorder)    S/P Botox  injection 11/04/2014   For migraine headaches   Type 2 diabetes mellitus (HCC)    Urgency of urination    Past Surgical History:  Procedure Laterality Date   CARPAL TUNNEL RELEASE Right 06/10/2013   Procedure: CARPAL TUNNEL RELEASE;  Surgeon: Darina MALVA Boehringer, MD;  Location: [facility];  Service: Neurosurgery;  Laterality: Right;  Right Carpal Tunnel Release    CHOLECYSTECTOMY     COLONOSCOPY N/A 07/30/2024   Procedure: COLONOSCOPY;  Surgeon: Eartha Angelia Sieving, MD;  Location: [facility];  Service: Gastroenterology;  Laterality: N/A;  930am, asa 3   DENTAL SURGERY  12/05/2014   ESOPHAGOGASTRODUODENOSCOPY (EGD) WITH PROPOFOL  N/A 02/16/2024   Procedure: ESOPHAGOGASTRODUODENOSCOPY (EGD) WITH PROPOFOL ;  Surgeon: Eartha Angelia Sieving, MD;  Location: [facility];  Service: Gastroenterology;  Laterality: N/A;  11:30am;ASA 1   mole exc     x  3   MUSCLE BIOPSY  12/05/2006   Right leg   POLYPECTOMY  02/16/2024   Procedure: POLYPECTOMY;  Surgeon: Eartha Flavors, Toribio, MD;  Location: [facility];  Service: Gastroenterology;;   HARLEY DILATION  02/16/2024   Procedure: EGD, WITH DILATION USING SAVARY-GILLIARD DILATOR OVER GUIDEWIRE;  Surgeon: Eartha Flavors, Toribio, MD;  Location: [facility];  Service: Gastroenterology;;   TRIGGER FINGER RELEASE Right 03/2024   thumb   TUBAL LIGATION      SOCIAL HISTORY:  Social History   Socioeconomic History   Marital status: Married    Spouse name: Not on file   Number of children: 2   Years of education: College   Highest education level: Associate degree: academic program  Occupational History   Occupation: Chiropractor: UNEMPLOYED    Comment: Now disabled due to bipolar and fibromyalgia  Tobacco Use   Smoking status: Former    Current packs/day: 0.00    Types: Cigarettes    Quit date: 11/04/2010    Years since quitting: 13.9    Passive exposure: Past   Smokeless tobacco: Never  Vaping Use   Vaping  status: Never Used  Substance and Sexual Activity   Alcohol use: Yes    Comment: one mixed drink per month   Drug use: No   Sexual activity: Yes    Birth control/protection: Surgical  Other Topics Concern   Not on file  Social History Narrative   Married   Lives with spouse and daughter   Right handed.   Caffeine use: 2 cups coffee in morning and 2 cups tea during the day    Social Drivers of Health   Financial Resource Strain: Low Risk  (09/24/2024)   Overall Financial Resource Strain (CARDIA)    Difficulty of Paying Living Expenses: Not hard at all  Food Insecurity: No Food Insecurity (09/24/2024)   Hunger Vital Sign    Worried About Running Out of Food in the Last Year: Never true    Ran Out of Food in the Last Year: Never true  Transportation Needs: No Transportation Needs (09/24/2024)   PRAPARE - Administrator, Civil Service (Medical): No    Lack of Transportation (Non-Medical): No  Physical Activity: Inactive (09/24/2024)   Exercise Vital Sign    Days of Exercise per Week: 0 days    Minutes of Exercise per Session: 0 min  Stress: Stress Concern Present (09/24/2024)   [employer] of Occupational Health - Occupational Stress Questionnaire    Feeling of Stress: To some extent  Social Connections: Socially Integrated (09/24/2024)   Social Connection and Isolation Panel    Frequency of Communication with Friends and Family: More than three times a week    Frequency of Social Gatherings with Friends and Family: Twice a week    Attends Religious Services: More than 4 times per year    Active Member of [employer] or Organizations: Yes    Attends Banker Meetings: Never    Marital Status: Married  Catering Manager Violence: Not At Risk (09/24/2024)   Humiliation, Afraid, Rape, and Kick questionnaire    Fear of Current or Ex-Partner: No    Emotionally Abused: No    Physically Abused: No    Sexually Abused: No    FAMILY HISTORY:  Family History   Problem Relation Age of Onset   Fibromyalgia Mother    Diabetes type II Mother    Depression Mother    Alzheimer's disease Mother  GER disease Mother    Heart disease Mother    Paranoid behavior Mother    Dementia Mother    Heart attack Father 22       MI x5   Stroke Father        CVA x7   Asthma Father    Brain cancer Father    Seizures Father    Anxiety disorder Sister    OCD Sister    Sexual abuse Sister    Physical abuse Sister    Anxiety disorder Sister    Fibromyalgia Sister    Other Sister        Temporomandibular Joint   ADD / ADHD Daughter    ADD / ADHD Daughter    Alcohol abuse Neg Hx    Drug abuse Neg Hx    Schizophrenia Neg Hx     CURRENT MEDICATIONS:  Outpatient Encounter Medications as of 10/29/2024  Medication Sig Note   albuterol  (VENTOLIN  HFA) 108 (90 Base) MCG/ACT inhaler Inhale 2 puffs into the lungs every 6 (six) hours as needed for wheezing and/or shortness of breath.    aspirin EC 81 MG tablet Take 1 tablet every day by oral route.    azelastine  (ASTELIN ) 0.1 % nasal spray Place 1 spray into both nostrils 2 (two) times daily.    Blood Glucose Monitoring Suppl (ONETOUCH VERIO) w/Device KIT Use to check glucose once daily    buPROPion  (WELLBUTRIN  XL) 150 MG 24 hr tablet Take 1 tablet (150 mg total) by mouth every morning.    cetirizine  (ZYRTEC ) 10 MG tablet Take 10 mg by mouth at bedtime.    clonazePAM  (KLONOPIN ) 0.5 MG tablet Take 1 tablet (0.5 mg total) by mouth at bedtime.    Coenzyme Q10 (COQ10 PO) Take by mouth. One daily    Continuous Glucose Sensor (FREESTYLE LIBRE 3 PLUS SENSOR) MISC Change sensor every 15 days.    dapagliflozin  propanediol (FARXIGA ) 10 MG TABS tablet Take 1 tablet (10 mg total) by mouth in the morning.    estradiol  (ESTRACE ) 0.1 MG/GM vaginal cream Place 1 Applicatorful vaginally 3 (three) times a week.    FLUoxetine  (PROZAC ) 20 MG capsule Take 1 capsule (20 mg total) by mouth daily.    FLUoxetine  (PROZAC ) 40 MG capsule  Take 1 capsule (40 mg total) by mouth daily.    fluticasone  (FLONASE ) 50 MCG/ACT nasal spray Place 1 spray into both nostrils daily.    Fluticasone -Umeclidin-Vilant (TRELEGY ELLIPTA ) 100-62.5-25 MCG/ACT AEPB Inhale 1 puff into the lungs.    Galcanezumab -gnlm (EMGALITY ) 120 MG/ML SOSY Inject 1 mL into the skin every 30 (thirty) days.    gatifloxacin  (ZYMAXID ) 0.5 % SOLN Instill 1 drop into right eye four times a day as directed Store upside down!    Immune Globulin , Human,-klhw (XEMBIFY ) 10 GM/50ML SOLN Inject 10 g into the skin every 7 (seven) days. 08/02/2024: Had past Thursday, every 14 days   insulin  glargine (LANTUS ) 100 UNIT/ML Solostar Pen Inject 40 Units into the skin at bedtime. (Patient taking differently: Inject 30 Units into the skin at bedtime.) 08/02/2024: PT. States she is only taking 30 units   ipratropium-albuterol  (DUONEB) 0.5-2.5 (3) MG/3ML SOLN Take 3 mLs by nebulization 2 (two) times daily as needed.    ketorolac  (ACULAR ) 0.5 % ophthalmic solution Place 1 drop into the right eye 2 (two) times daily as directed    lamoTRIgine  (LAMICTAL ) 25 MG tablet Take 1 tablet (25 mg total) by mouth 2 (two) times daily.    Lancets [facility]  ULTRASOFT) lancets Use to check blood sugar once daily    Latanoprostene Bunod  (VYZULTA ) 0.024 % SOLN Place 1 drop into both eyes at bedtime as directed.    methylphenidate  (RITALIN ) 10 MG tablet Take 1 tablet (10 mg total) by mouth 2 (two) times daily with breakfast and lunch.    omeprazole  (PRILOSEC) 20 MG capsule Take 1 capsule (20 mg total) by mouth 2 (two) times daily.    ondansetron  (ZOFRAN -ODT) 8 MG disintegrating tablet Dissolve 1 tablet (8 mg total) by mouth every 8 (eight) hours as needed for nausea or vomiting.    ONETOUCH VERIO test strip Use to check glucose once daily.    OVER THE COUNTER MEDICATION Magnessium 500 mg at night    prednisoLONE  acetate (PRED FORTE ) 1 % ophthalmic suspension Place 1 drop into the right eye 4 (four) times daily as  directed. Please shake well before each use!    pregabalin  (LYRICA ) 200 MG capsule Take 1 capsule (200 mg total) by mouth 3 (three) times daily.    tiZANidine  (ZANAFLEX ) 4 MG tablet Take 1 tablet (4 mg total) by mouth every 12 (twelve) hours as needed.    traMADol  (ULTRAM ) 50 MG tablet Take 1 tablet (50 mg total) by mouth every 6 (six) hours as needed.    traZODone  (DESYREL ) 50 MG tablet Take 1 tablet (50 mg total) by mouth at bedtime.    UNABLE TO FIND 1 each by Does not apply route daily. Med Name: CPAP SUPPLIES Full face mask Cushion for mask -Medium ResMed    VITAMIN D  PO Take 5,000 Units by mouth daily.    zinc gluconate 50 MG tablet Take 50 mg by mouth daily.    zolmitriptan  (ZOMIG ) 5 MG nasal solution Administer 5 mg into affected nostril(s) as needed (1 nasal spray at onset of severe headache; may repeat once after 2 hours if headache persists; max of 2 doses in 24 hours or 4/week.).    zonisamide  (ZONEGRAN ) 50 MG capsule Take 1 capsule by mouth at bedtime for 5 days then increase to 2 capsules at bedtime thereafter.    [DISCONTINUED] buPROPion  (WELLBUTRIN  XL) 150 MG 24 hr tablet Take 1 tablet (150 mg total) by mouth every morning.    [DISCONTINUED] cycloSPORINE  (RESTASIS ) 0.05 % ophthalmic emulsion Place 1 drop into both eyes 2 (two) times daily.    [DISCONTINUED] omeprazole  (PRILOSEC) 20 MG capsule Take 1 capsule (20 mg total) by mouth 2 (two) times daily.    [DISCONTINUED] rosuvastatin  (CRESTOR ) 40 MG tablet Take 1 tablet (40 mg total) by mouth at bedtime. (Patient not taking: Reported on 10/18/2024) 10/22/2024: stopped by provider   [DISCONTINUED] SUMAtriptan  (IMITREX ) 100 MG tablet Take 1 tablet by mouth as needed for migraine. If migraine continues, may repeat dose after 2 hours.    No facility-administered encounter medications on file as of 10/29/2024.    ALLERGIES:  Allergies  Allergen Reactions   Dust Mite Extract Shortness Of Breath, Other (See Comments) and Cough     Wheezing     Gramineae Pollens Shortness Of Breath    wheezing   Misc. Sulfonamide Containing Compounds Anaphylaxis   Molds & Smuts Shortness Of Breath    wheezing     Other Anaphylaxis, Other (See Comments) and Shortness Of Breath    : Angioedema  wheezing  Wheezing  Other reaction(s): Cough  Itching and rash  Other reaction(s): Other (See Comments)   : Angioedema   Statins Anaphylaxis    Patient states she is currently on  a Statin    Sulfa Antibiotics Anaphylaxis and Other (See Comments)    : Angioedema  : Angioedema  : Angioedema     : Angioedema    : Angioedema  : Angioedema   : Angioedema,  : Angioedema     : Angioedema,  : Angioedema   Sulfamethoxazole-Trimethoprim Anaphylaxis and Other (See Comments)   Trichophyton Shortness Of Breath    wheezing     Carbamazepine  Rash, Dermatitis and Other (See Comments)    Other Reaction(s): Other (See Comments)   Gluten Meal Itching and Other (See Comments)    Itching and rash Other reaction(s): Abdominal Pain    Quetiapine Other (See Comments)   Quetiapine Fumarate Other (See Comments)    LABORATORY DATA:  I have reviewed the labs as listed.  CBC    Component Value Date/Time   WBC 6.6 10/18/2024 0925   WBC 7.4 08/02/2024 1112   RBC 5.20 10/18/2024 0925   RBC 4.72 08/02/2024 1112   HGB 16.0 (H) 10/18/2024 0925   HCT 48.0 (H) 10/18/2024 0925   PLT 267 10/18/2024 0925   MCV 92 10/18/2024 0925   MCH 30.8 10/18/2024 0925   MCH 31.8 08/02/2024 1112   MCHC 33.3 10/18/2024 0925   MCHC 33.3 08/02/2024 1112   RDW 12.8 10/18/2024 0925   LYMPHSABS 1.7 10/18/2024 0925   MONOABS 0.6 10/02/2023 0948   EOSABS 0.2 10/18/2024 0925   BASOSABS 0.1 10/18/2024 0925      Latest Ref Rng & Units 10/18/2024    9:36 AM 09/23/2024    1:18 PM 08/02/2024   11:12 AM  CMP  Glucose 70 - 99 mg/dL 887   96   BUN 6 - 24 mg/dL 17   14   Creatinine 9.42 - 1.00 mg/dL 8.74  8.79  8.87   Sodium 134 - 144 mmol/L 140   139   Potassium  3.5 - 5.2 mmol/L 5.9   4.2   Chloride 96 - 106 mmol/L 103   104   CO2 20 - 29 mmol/L 22   23   Calcium  8.7 - 10.2 mg/dL 89.1   9.6   Total Protein 6.0 - 8.5 g/dL 6.4     Total Bilirubin 0.0 - 1.2 mg/dL 0.4     Alkaline Phos 49 - 135 IU/L 87     AST 0 - 40 IU/L 27     ALT 0 - 32 IU/L 34       DIAGNOSTIC IMAGING:  I have independently reviewed the relevant imaging and discussed with the patient.   WRAP UP:  All questions were answered. The patient knows to call the clinic with any problems, questions or concerns.  Medical decision making: High (extensive discussion with patient regarding CVID and answering multiple patient questions regarding symptoms and treatment options)  Time spent on visit: I spent 40 minutes counseling the patient face to face. The total time spent in the appointment was 60 minutes and more than 50% was on counseling.  Pleasant CHRISTELLA Barefoot, PA-C  10/29/24 4:42 PM

## 2024-10-29 NOTE — Progress Notes (Signed)
 Patient tolerated flu vaccine with no complaints voiced.  Site clean and dry with no bruising or swelling noted at site.  See MAR for details.  Band aid applied.  Patient stable during and after injection.  Vss with discharge and left in satisfactory condition with no s/s of distress noted. All follow ups as scheduled.   Brandice Busser

## 2024-10-29 NOTE — Patient Instructions (Signed)
 [facility] [place] - A DEPT OF [facility]  Discharge Instructions: Thank you for choosing [facility] to provide your oncology and hematology care.  If you have a lab appointment with the Cancer Center - please note that after April 8th, 2024, all labs will be drawn in the cancer center.  You do not have to check in or register with the main entrance as you have in the past but will complete your check-in in the cancer center.  Wear comfortable clothing and clothing appropriate for easy access to any Portacath or PICC line.   We strive to give you quality time with your provider. You may need to reschedule your appointment if you arrive late (15 or more minutes).  Arriving late affects you and other patients whose appointments are after yours.  Also, if you miss three or more appointments without notifying the office, you may be dismissed from the clinic at the provider's discretion.      For prescription refill requests, have your pharmacy contact our office and allow 72 hours for refills to be completed.    Today you received the influenza vaccine.   To help prevent nausea and vomiting after your treatment, we encourage you to take your nausea medication as directed.  BELOW ARE SYMPTOMS THAT SHOULD BE REPORTED IMMEDIATELY: *FEVER GREATER THAN 100.4 F (38 C) OR HIGHER *CHILLS OR SWEATING *NAUSEA AND VOMITING THAT IS NOT CONTROLLED WITH YOUR NAUSEA MEDICATION *UNUSUAL SHORTNESS OF BREATH *UNUSUAL BRUISING OR BLEEDING *URINARY PROBLEMS (pain or burning when urinating, or frequent urination) *BOWEL PROBLEMS (unusual diarrhea, constipation, pain near the anus) TENDERNESS IN MOUTH AND THROAT WITH OR WITHOUT PRESENCE OF ULCERS (sore throat, sores in mouth, or a toothache) UNUSUAL RASH, SWELLING OR PAIN  UNUSUAL VAGINAL DISCHARGE OR ITCHING   Items with * indicate a potential emergency and should be followed up as soon as possible or go to the Emergency  Department if any problems should occur.  Please show the CHEMOTHERAPY ALERT CARD or IMMUNOTHERAPY ALERT CARD at check-in to the Emergency Department and triage nurse.  Should you have questions after your visit or need to cancel or reschedule your appointment, please contact [facility] [place] - A DEPT OF [facility] [phone]  and follow the prompts.  Office hours are 8:00 a.m. to 4:30 p.m. Monday - Friday. Please note that voicemails left after 4:00 p.m. may not be returned until the following business day.  We are closed weekends and major holidays. You have access to a nurse at all times for urgent questions. Please call the main number to the clinic [phone] and follow the prompts.  For any non-urgent questions, you may also contact your provider using MyChart. We now offer e-Visits for anyone 10 and older to request care online for non-urgent symptoms. For details visit [email or web address].   Also download the MyChart app! Go to the app store, search MyChart, open the app, select , and log in with your MyChart username and password.

## 2024-10-30 ENCOUNTER — Ambulatory Visit: Payer: Self-pay | Admitting: Gastroenterology

## 2024-10-30 ENCOUNTER — Other Ambulatory Visit: Payer: Self-pay

## 2024-10-30 DIAGNOSIS — F3181 Bipolar II disorder: Secondary | ICD-10-CM | POA: Diagnosis not present

## 2024-10-30 DIAGNOSIS — F411 Generalized anxiety disorder: Secondary | ICD-10-CM | POA: Diagnosis not present

## 2024-11-01 ENCOUNTER — Other Ambulatory Visit (HOSPITAL_COMMUNITY): Payer: Self-pay

## 2024-11-02 LAB — GASTRIN: Gastrin: 233 pg/mL — ABNORMAL HIGH (ref 0–115)

## 2024-11-02 LAB — CBC WITH DIFFERENTIAL/PLATELET
Basophils Absolute: 0.1 x10E3/uL (ref 0.0–0.2)
Basos: 1 %
EOS (ABSOLUTE): 0.2 x10E3/uL (ref 0.0–0.4)
Eos: 2 %
Hematocrit: 48 % — ABNORMAL HIGH (ref 34.0–46.6)
Hemoglobin: 16 g/dL — ABNORMAL HIGH (ref 11.1–15.9)
Immature Grans (Abs): 0 x10E3/uL (ref 0.0–0.1)
Immature Granulocytes: 0 %
Lymphocytes Absolute: 1.7 x10E3/uL (ref 0.7–3.1)
Lymphs: 26 %
MCH: 30.8 pg (ref 26.6–33.0)
MCHC: 33.3 g/dL (ref 31.5–35.7)
MCV: 92 fL (ref 79–97)
Monocytes Absolute: 0.5 x10E3/uL (ref 0.1–0.9)
Monocytes: 8 %
Neutrophils Absolute: 4.1 x10E3/uL (ref 1.4–7.0)
Neutrophils: 63 %
Platelets: 267 x10E3/uL (ref 150–450)
RBC: 5.2 x10E6/uL (ref 3.77–5.28)
RDW: 12.8 % (ref 11.7–15.4)
WBC: 6.6 x10E3/uL (ref 3.4–10.8)

## 2024-11-02 LAB — SOMATOSTATIN: Somatostatin: 24 pg/mL (ref <=30)

## 2024-11-02 LAB — VASOACTIVE INTESTINAL PEPTIDE (VIP): Vasoactive Intest Polypeptide: <16.8 pg/mL (ref 0.0–58.8)

## 2024-11-02 LAB — TISSUE TRANSGLUTAMINASE, IGG: t-Transglutaminase (tTG) IgG: <2 U/mL (ref 0–5)

## 2024-11-03 ENCOUNTER — Encounter: Payer: Self-pay | Admitting: Physician Assistant

## 2024-11-04 NOTE — Telephone Encounter (Signed)
Please reply.

## 2024-11-04 NOTE — Telephone Encounter (Signed)
 Message read by the pt

## 2024-11-05 ENCOUNTER — Other Ambulatory Visit (HOSPITAL_COMMUNITY): Payer: Self-pay

## 2024-11-05 ENCOUNTER — Telehealth (INDEPENDENT_AMBULATORY_CARE_PROVIDER_SITE_OTHER): Admitting: Psychiatry

## 2024-11-05 ENCOUNTER — Encounter (HOSPITAL_COMMUNITY): Payer: Self-pay | Admitting: Psychiatry

## 2024-11-05 ENCOUNTER — Other Ambulatory Visit: Payer: Self-pay

## 2024-11-05 DIAGNOSIS — F332 Major depressive disorder, recurrent severe without psychotic features: Secondary | ICD-10-CM

## 2024-11-05 DIAGNOSIS — F319 Bipolar disorder, unspecified: Secondary | ICD-10-CM | POA: Diagnosis not present

## 2024-11-05 MED ORDER — CLONAZEPAM 0.5 MG PO TABS
0.5000 mg | ORAL_TABLET | Freq: Every day | ORAL | 2 refills | Status: AC
  Filled 2024-11-05: qty 30, 30d supply, fill #0
  Filled 2024-11-26 – 2024-12-04 (×2): qty 30, 30d supply, fill #1

## 2024-11-05 MED ORDER — BUPROPION HCL ER (XL) 150 MG PO TB24
150.0000 mg | ORAL_TABLET | Freq: Every morning | ORAL | 2 refills | Status: AC
  Filled 2024-11-05: qty 30, 30d supply, fill #0
  Filled 2024-12-09: qty 30, 30d supply, fill #1
  Filled 2025-01-08: qty 30, 30d supply, fill #2

## 2024-11-05 MED ORDER — FLUOXETINE HCL 20 MG PO CAPS
20.0000 mg | ORAL_CAPSULE | Freq: Every day | ORAL | 2 refills | Status: AC
  Filled 2024-11-05: qty 30, 30d supply, fill #0
  Filled 2024-12-09: qty 30, 30d supply, fill #1
  Filled 2025-01-08: qty 30, 30d supply, fill #2

## 2024-11-05 MED ORDER — TRAZODONE HCL 50 MG PO TABS
50.0000 mg | ORAL_TABLET | Freq: Every day | ORAL | 2 refills | Status: AC
  Filled 2024-11-05: qty 30, 30d supply, fill #0
  Filled 2024-12-09: qty 30, 30d supply, fill #1
  Filled 2025-01-08: qty 30, 30d supply, fill #2

## 2024-11-05 MED ORDER — FLUOXETINE HCL 40 MG PO CAPS
40.0000 mg | ORAL_CAPSULE | Freq: Every day | ORAL | 2 refills | Status: AC
  Filled 2024-11-05: qty 30, 30d supply, fill #0
  Filled 2024-12-09: qty 30, 30d supply, fill #1
  Filled 2025-01-08: qty 30, 30d supply, fill #2

## 2024-11-05 NOTE — Progress Notes (Signed)
 Virtual Visit via Video Note  I connected with [name] on 11/05/24 at  9:20 AM EST by a video enabled telemedicine application and verified that I am speaking with the correct person using two identifiers.  Location: Patient: home Provider: office   I discussed the limitations of evaluation and management by telemedicine and the availability of in person appointments. The patient expressed understanding and agreed to proceed.      I discussed the assessment and treatment plan with the patient. The patient was provided an opportunity to ask questions and all were answered. The patient agreed with the plan and demonstrated an understanding of the instructions.   The patient was advised to call back or seek an in-person evaluation if the symptoms worsen or if the condition fails to improve as anticipated.  I provided 20 minutes of non-face-to-face time during this encounter.   Michelle Gull, MD  [facility] MD/PA/NP OP Progress Note  11/05/2024 9:45 AM Michelle Clements  MRN:  [id number]  Chief Complaint:  Chief Complaint  Patient presents with   ADD   Anxiety   Depression   Follow-up   HPI: This patient is a 58 year old married white female who lives with her husband in [place]. She has 2 grown daughters. 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  Last time she stated she was having trouble keeping her thoughts together and was jumping from thing to thing in her mind.  She stated that she was diagnosed with ADD in college and had benefited from stimulant medication.  She wanted to try this again.  She did try methylphenidate  10 mg twice daily but states it has not really changed anything.  However her thoughts are less jumpy because she is processing things with a therapist and also in a support group.  Right now she states that her mood has been good  and she denies significant depression or thoughts of self-harm or suicide.  She denies significant anxiety.  Her sleep is somewhat disrupted but she does have sleep apnea and her sleep specialist is going to make some changes in her treatment. Visit Diagnosis:    ICD-10-CM   1. Bipolar 1 disorder (HCC)  F31.9     2. Major depressive disorder, recurrent, severe without psychotic features (HCC)  F33.2       Past Psychiatric History:  Admission 2 years ago for overdose/suicide attempt followed by partial hospital program. She was also admitted for detox several years ago   Past Medical History:  Past Medical History:  Diagnosis Date   Allergic rhinitis    Anemia    Arthritis    Lower back and hips   Asthma    Bipolar disorder (HCC)    Bronchitis 05/2024   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HCC)    Constipation    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HCC)    Depression    Essential hypertension, benign    Fibromyalgia    GERD (gastroesophageal reflux disease)    H/O sleep apnea    Hip pain, left    History of palpitations    Negative Holter monitor   History of pneumonia 12/05/1986   Hyperlipidemia    IBS (irritable bowel syndrome)    Nausea and vomiting 02/16/2024   Neck pain    Obstructive sleep apnea (adult) (pediatric) 04/12/2021   Poor short term memory  PTSD (post-traumatic stress disorder)    S/P Botox injection 11/04/2014   For migraine headaches   Type 2 diabetes mellitus (HCC)    Urgency of urination     Past Surgical History:  Procedure Laterality Date   CARPAL TUNNEL RELEASE Right 06/10/2013   Procedure: CARPAL TUNNEL RELEASE;  Surgeon: Darina MALVA Boehringer, MD;  Location: [facility];  Service: Neurosurgery;  Laterality: Right;  Right Carpal Tunnel Release    CHOLECYSTECTOMY     COLONOSCOPY N/A 07/30/2024   Procedure: COLONOSCOPY;  Surgeon: Eartha Angelia Sieving, MD;  Location: [facility];  Service: Gastroenterology;  Laterality: N/A;  930am, asa 3    DENTAL SURGERY  12/05/2014   ESOPHAGOGASTRODUODENOSCOPY (EGD) WITH PROPOFOL  N/A 02/16/2024   Procedure: ESOPHAGOGASTRODUODENOSCOPY (EGD) WITH PROPOFOL ;  Surgeon: Eartha Angelia Sieving, MD;  Location: [facility];  Service: Gastroenterology;  Laterality: N/A;  11:30am;ASA 1   mole exc     x 3   MUSCLE BIOPSY  12/05/2006   Right leg   POLYPECTOMY  02/16/2024   Procedure: POLYPECTOMY;  Surgeon: Eartha Angelia, Sieving, MD;  Location: [facility];  Service: Gastroenterology;;   HARLEY DILATION  02/16/2024   Procedure: EGD, WITH DILATION USING SAVARY-GILLIARD DILATOR OVER GUIDEWIRE;  Surgeon: Eartha Angelia, Sieving, MD;  Location: [facility];  Service: Gastroenterology;;   TRIGGER FINGER RELEASE Right 03/2024   thumb   TUBAL LIGATION      Family Psychiatric History: See below  Family History:  Family History  Problem Relation Age of Onset   Fibromyalgia Mother    Diabetes type II Mother    Depression Mother    Alzheimer's disease Mother    GER disease Mother    Heart disease Mother    Paranoid behavior Mother    Dementia Mother    Heart attack Father 38       MI x5   Stroke Father        CVA x7   Asthma Father    Brain cancer Father    Seizures Father    Anxiety disorder Sister    OCD Sister    Sexual abuse Sister    Physical abuse Sister    Anxiety disorder Sister    Fibromyalgia Sister    Other Sister        Temporomandibular Joint   ADD / ADHD Daughter    ADD / ADHD Daughter    Alcohol abuse Neg Hx    Drug abuse Neg Hx    Schizophrenia Neg Hx     Social History:  Social History   Socioeconomic History   Marital status: Married    Spouse name: Not on file   Number of children: 2   Years of education: College   Highest education level: Associate degree: academic program  Occupational History   Occupation: Chiropractor: UNEMPLOYED    Comment: Now disabled due to bipolar and fibromyalgia  Tobacco Use   Smoking status: Former     Current packs/day: 0.00    Types: Cigarettes    Quit date: 11/04/2010    Years since quitting: 14.0    Passive exposure: Past   Smokeless tobacco: Never  Vaping Use   Vaping status: Never Used  Substance and Sexual Activity   Alcohol use: Yes    Comment: one mixed drink per month   Drug use: No   Sexual activity: Yes    Birth control/protection: Surgical  Other Topics Concern   Not on file  Social History Narrative   Married   Lives with spouse and daughter   Right handed.   Caffeine use: 2 cups coffee in morning and 2 cups tea during the day    Social Drivers of Health   Financial Resource Strain: Low Risk  (09/24/2024)   Overall Financial Resource Strain (CARDIA)    Difficulty of Paying Living Expenses: Not hard at all  Food Insecurity: No Food Insecurity (09/24/2024)   Hunger Vital Sign    Worried About Running Out of Food in the Last Year: Never true    Ran Out of Food in the Last Year: Never true  Transportation Needs: No Transportation Needs (09/24/2024)   PRAPARE - Administrator, Civil Service (Medical): No    Lack of Transportation (Non-Medical): No  Physical Activity: Inactive (09/24/2024)   Exercise Vital Sign    Days of Exercise per Week: 0 days    Minutes of Exercise per Session: 0 min  Stress: Stress Concern Present (09/24/2024)   [employer] of Occupational Health - Occupational Stress Questionnaire    Feeling of Stress: To some extent  Social Connections: Socially Integrated (09/24/2024)   Social Connection and Isolation Panel    Frequency of Communication with Friends and Family: More than three times a week    Frequency of Social Gatherings with Friends and Family: Twice a week    Attends Religious Services: More than 4 times per year    Active Member of [employer] or Organizations: Yes    Attends Banker Meetings: Never    Marital Status: Married    Allergies:  Allergies  Allergen Reactions   Dust Mite Extract Shortness  Of Breath, Other (See Comments) and Cough    Wheezing     Gramineae Pollens Shortness Of Breath    wheezing   Misc. Sulfonamide Containing Compounds Anaphylaxis   Molds & Smuts Shortness Of Breath    wheezing     Other Anaphylaxis, Other (See Comments) and Shortness Of Breath    : Angioedema  wheezing  Wheezing  Other reaction(s): Cough  Itching and rash  Other reaction(s): Other (See Comments)   : Angioedema   Statins Anaphylaxis    Patient states she is currently on a Statin    Sulfa Antibiotics Anaphylaxis and Other (See Comments)    : Angioedema  : Angioedema  : Angioedema     : Angioedema    : Angioedema  : Angioedema   : Angioedema,  : Angioedema     : Angioedema,  : Angioedema   Sulfamethoxazole-Trimethoprim Anaphylaxis and Other (See Comments)   Trichophyton Shortness Of Breath    wheezing     Carbamazepine  Rash, Dermatitis and Other (See Comments)    Other Reaction(s): Other (See Comments)   Gluten Meal Itching and Other (See Comments)    Itching and rash Other reaction(s): Abdominal Pain    Quetiapine Other (See Comments)   Quetiapine Fumarate Other (See Comments)    Metabolic Disorder Labs: Lab Results  Component Value Date   HGBA1C 6.2 (A) 10/18/2024   MPG 131 03/18/2015   No results found for: PROLACTIN Lab Results  Component Value Date   CHOL 165 12/05/2023   TRIG 165 (H) 12/05/2023   HDL 49 12/05/2023   CHOLHDL 3.4 12/05/2023   VLDL 44 (H) 03/18/2015   LDLCALC 88 12/05/2023   LDLCALC 126 03/31/2022   Lab Results  Component Value Date   TSH 1.770 10/18/2024   TSH 1.500 03/05/2024  Therapeutic Level Labs: Lab Results  Component Value Date   LITHIUM  0.20 (L) 02/21/2012   No results found for: VALPROATE No results found for: CBMZ  Current Medications: Current Outpatient Medications  Medication Sig Dispense Refill   albuterol  (VENTOLIN  HFA) 108 (90 Base) MCG/ACT inhaler Inhale 2 puffs into the lungs every 6 (six)  hours as needed for wheezing and/or shortness of breath. 6.7 g 5   aspirin EC 81 MG tablet Take 1 tablet every day by oral route.     azelastine  (ASTELIN ) 0.1 % nasal spray Place 1 spray into both nostrils 2 (two) times daily. 30 mL 2   Blood Glucose Monitoring Suppl (ONETOUCH VERIO) w/Device KIT Use to check glucose once daily 1 kit 0   buPROPion  (WELLBUTRIN  XL) 150 MG 24 hr tablet Take 1 tablet (150 mg total) by mouth every morning. 30 tablet 2   cetirizine  (ZYRTEC ) 10 MG tablet Take 10 mg by mouth at bedtime.     clonazePAM  (KLONOPIN ) 0.5 MG tablet Take 1 tablet (0.5 mg total) by mouth at bedtime. 30 tablet 2   Coenzyme Q10 (COQ10 PO) Take by mouth. One daily     Continuous Glucose Sensor (FREESTYLE LIBRE 3 PLUS SENSOR) MISC Change sensor every 15 days. 6 each 3   dapagliflozin  propanediol (FARXIGA ) 10 MG TABS tablet Take 1 tablet (10 mg total) by mouth in the morning. 30 tablet 11   estradiol  (ESTRACE ) 0.1 MG/GM vaginal cream Place 1 Applicatorful vaginally 3 (three) times a week. 42.5 g 1   FLUoxetine  (PROZAC ) 20 MG capsule Take 1 capsule (20 mg total) by mouth daily. 30 capsule 2   FLUoxetine  (PROZAC ) 40 MG capsule Take 1 capsule (40 mg total) by mouth daily. 30 capsule 2   fluticasone  (FLONASE ) 50 MCG/ACT nasal spray Place 1 spray into both nostrils daily. 16 g 3   Fluticasone -Umeclidin-Vilant (TRELEGY ELLIPTA ) 100-62.5-25 MCG/ACT AEPB Inhale 1 puff into the lungs.     Galcanezumab -gnlm (EMGALITY ) 120 MG/ML SOSY Inject 1 mL into the skin every 30 (thirty) days. 1 mL 11   gatifloxacin  (ZYMAXID ) 0.5 % SOLN Instill 1 drop into right eye four times a day as directed Store upside down! 5 mL 1   Immune Globulin , Human,-klhw (XEMBIFY ) 10 GM/50ML SOLN Inject 10 g into the skin every 7 (seven) days. 200 mL 3   insulin  glargine (LANTUS ) 100 UNIT/ML Solostar Pen Inject 40 Units into the skin at bedtime. (Patient taking differently: Inject 30 Units into the skin at bedtime.) 36 mL 3    ipratropium-albuterol  (DUONEB) 0.5-2.5 (3) MG/3ML SOLN Take 3 mLs by nebulization 2 (two) times daily as needed. 90 mL 2   ketorolac  (ACULAR ) 0.5 % ophthalmic solution Place 1 drop into the right eye 2 (two) times daily as directed 5 mL 1   lamoTRIgine  (LAMICTAL ) 25 MG tablet Take 1 tablet (25 mg total) by mouth 2 (two) times daily. 60 tablet 2   Lancets (ONETOUCH ULTRASOFT) lancets Use to check blood sugar once daily 100 each 3   Latanoprostene Bunod  (VYZULTA ) 0.024 % SOLN Place 1 drop into both eyes at bedtime as directed. 15 mL 3   omeprazole  (PRILOSEC) 20 MG capsule Take 1 capsule (20 mg total) by mouth 2 (two) times daily. 60 capsule 2   ondansetron  (ZOFRAN -ODT) 8 MG disintegrating tablet Dissolve 1 tablet (8 mg total) by mouth every 8 (eight) hours as needed for nausea or vomiting. 20 tablet 6   ONETOUCH VERIO test strip Use to check glucose once daily.  100 each 3   OVER THE COUNTER MEDICATION Magnessium 500 mg at night     prednisoLONE  acetate (PRED FORTE ) 1 % ophthalmic suspension Place 1 drop into the right eye 4 (four) times daily as directed. Please shake well before each use! 5 mL 1   pregabalin  (LYRICA ) 200 MG capsule Take 1 capsule (200 mg total) by mouth 3 (three) times daily. 90 capsule 5   tiZANidine  (ZANAFLEX ) 4 MG tablet Take 1 tablet (4 mg total) by mouth every 12 (twelve) hours as needed. 60 tablet 11   traMADol  (ULTRAM ) 50 MG tablet Take 1 tablet (50 mg total) by mouth every 6 (six) hours as needed. 15 tablet 0   traZODone  (DESYREL ) 50 MG tablet Take 1 tablet (50 mg total) by mouth at bedtime. 30 tablet 2   UNABLE TO FIND 1 each by Does not apply route daily. Med Name: CPAP SUPPLIES Full face mask Cushion for mask -Medium ResMed 1 each 11   VITAMIN D  PO Take 5,000 Units by mouth daily.     zinc gluconate 50 MG tablet Take 50 mg by mouth daily.     zolmitriptan  (ZOMIG ) 5 MG nasal solution Administer 5 mg into affected nostril(s) as needed (1 nasal spray at onset of severe  headache; may repeat once after 2 hours if headache persists; max of 2 doses in 24 hours or 4/week.). 12 each 5   zonisamide  (ZONEGRAN ) 50 MG capsule Take 1 capsule by mouth at bedtime for 5 days then increase to 2 capsules at bedtime thereafter. 60 capsule 3   No current facility-administered medications for this visit.     Musculoskeletal: Strength & Muscle Tone: within normal limits Gait & Station: normal Patient leans: N/A  Psychiatric Specialty Exam: Review of Systems  Musculoskeletal:  Positive for arthralgias and myalgias.  All other systems reviewed and are negative.   Last menstrual period 01/04/2018.There is no height or weight on file to calculate BMI.  General Appearance: Casual and Fairly Groomed  Eye Contact:  Good  Speech:  Clear and Coherent  Volume:  Normal  Mood:  Euthymic  Affect:  Congruent  Thought Process:  Goal Directed  Orientation:  Full (Time, Place, and Person)  Thought Content: WDL   Suicidal Thoughts:  No  Homicidal Thoughts:  No  Memory:  Immediate;   Good Recent;   Good Remote;   Fair  Judgement:  Good  Insight:  Good  Psychomotor Activity:  Decreased  Concentration:  Concentration: Good and Attention Span: Good  Recall:  Good  Fund of Knowledge: Good  Language: Good  Akathisia:  No  Handed:  Right  AIMS (if indicated): not done  Assets:  Communication Skills Desire for Improvement Physical Health Resilience Social Support  ADL's:  Intact  Cognition: WNL  Sleep:  Fair   Screenings: GAD-7    Flowsheet Row Video Visit from 05/23/2024 in [facility] Office Visit from 03/08/2024 in [facility] Telemedicine from 01/25/2024 in [facility] Primary Care Office Visit from 12/05/2023 in [name]  Total GAD-7 Score 0 13 10 10    Mini-Mental    Flowsheet Row Office Visit from 08/29/2019 in [facility]  Total Score (max 30 points  ) 27   PHQ2-9    Flowsheet Row Office Visit from 10/29/2024 in [facility] [place] - A Dept Of [place] Clinical Support from 09/24/2024 in [facility] Primary  Care Office Visit from 07/18/2024 in [facility] [place] - A Dept Of [place] Video Visit from 05/23/2024 in [facility] Primary Care Office Visit from 03/08/2024 in [facility]  PHQ-2 Total Score 2 2 0 0 4  PHQ-9 Total Score 8 8 -- 0 17   Flowsheet Row ED from 08/02/2024 in [facility] Emergency Department at [facility] Admission (Discharged) from 07/30/2024 in [facility] Admission (Discharged) from 02/16/2024 in [facility]  C-SSRS RISK CATEGORY No Risk No Risk No Risk     Assessment and Plan:  This patient is a 58 year old female with a history of posttraumatic stress disorder major depression generalized anxiety disorder and possible bipolar disorder.  The ADD diagnosis is less clear and she did not feel any benefit from stimulant.  Therefore we will continue Prozac  60 mg daily along with Wellbutrin  XL 150 mg daily for depression, trazodone  50 mg at bedtime for sleep, Lamictal  25 mg twice daily for mood stabilization and clonazepam  0.5 mg at bedtime for sleep or anxiety as needed.  She will return to see me in 3 months Collaboration of Care: Collaboration of Care: Primary Care Provider AEB notes will be shared with PCP at patient's request  Patient/Guardian was advised Release of Information must be obtained prior to any record release in order to collaborate their care with an outside provider. Patient/Guardian was advised if they have not already done so to contact the registration department to sign all necessary forms in order for us  to release information regarding their care.   Consent: Patient/Guardian gives verbal consent for treatment and assignment of benefits for services provided during this visit.  Patient/Guardian expressed understanding and agreed to proceed.    Michelle Gull, MD 11/05/2024, 9:45 AM

## 2024-11-07 ENCOUNTER — Other Ambulatory Visit (HOSPITAL_COMMUNITY): Payer: Self-pay

## 2024-11-07 ENCOUNTER — Other Ambulatory Visit: Payer: Self-pay

## 2024-11-07 DIAGNOSIS — H2512 Age-related nuclear cataract, left eye: Secondary | ICD-10-CM | POA: Diagnosis not present

## 2024-11-07 DIAGNOSIS — H25012 Cortical age-related cataract, left eye: Secondary | ICD-10-CM | POA: Diagnosis not present

## 2024-11-07 DIAGNOSIS — H2511 Age-related nuclear cataract, right eye: Secondary | ICD-10-CM | POA: Diagnosis not present

## 2024-11-07 DIAGNOSIS — H25042 Posterior subcapsular polar age-related cataract, left eye: Secondary | ICD-10-CM | POA: Diagnosis not present

## 2024-11-07 DIAGNOSIS — H5371 Glare sensitivity: Secondary | ICD-10-CM | POA: Diagnosis not present

## 2024-11-08 ENCOUNTER — Other Ambulatory Visit (HOSPITAL_COMMUNITY): Payer: Self-pay

## 2024-11-08 ENCOUNTER — Encounter (HOSPITAL_COMMUNITY): Payer: Self-pay

## 2024-11-08 ENCOUNTER — Other Ambulatory Visit: Payer: Self-pay

## 2024-11-08 MED ORDER — GATIFLOXACIN 0.5 % OP SOLN
1.0000 [drp] | Freq: Four times a day (QID) | OPHTHALMIC | 1 refills | Status: AC
  Filled 2024-11-08 – 2024-11-30 (×4): qty 5, 25d supply, fill #0
  Filled 2024-12-22: qty 5, 25d supply, fill #1

## 2024-11-08 MED ORDER — PREDNISOLONE ACETATE 1 % OP SUSP
1.0000 [drp] | Freq: Four times a day (QID) | OPHTHALMIC | 1 refills | Status: AC
  Filled 2024-11-08 – 2024-11-30 (×4): qty 5, 25d supply, fill #0
  Filled 2024-12-22: qty 5, 25d supply, fill #1

## 2024-11-08 MED ORDER — KETOROLAC TROMETHAMINE 0.5 % OP SOLN
1.0000 [drp] | Freq: Two times a day (BID) | OPHTHALMIC | 1 refills | Status: AC
  Filled 2024-11-08 – 2024-11-26 (×5): qty 5, 50d supply, fill #0

## 2024-11-09 ENCOUNTER — Other Ambulatory Visit: Payer: Self-pay

## 2024-11-09 ENCOUNTER — Other Ambulatory Visit (HOSPITAL_COMMUNITY): Payer: Self-pay

## 2024-11-10 ENCOUNTER — Other Ambulatory Visit (HOSPITAL_COMMUNITY): Payer: Self-pay

## 2024-11-11 ENCOUNTER — Ambulatory Visit: Admitting: Physical Therapy

## 2024-11-11 ENCOUNTER — Other Ambulatory Visit (HOSPITAL_COMMUNITY): Payer: Self-pay

## 2024-11-11 ENCOUNTER — Other Ambulatory Visit: Payer: Self-pay

## 2024-11-11 NOTE — Telephone Encounter (Signed)
 Keep plans for capsule study.

## 2024-11-12 ENCOUNTER — Encounter (HOSPITAL_COMMUNITY): Admission: RE | Payer: Self-pay

## 2024-11-12 ENCOUNTER — Ambulatory Visit (HOSPITAL_COMMUNITY): Admission: RE | Admit: 2024-11-12 | Admitting: Gastroenterology

## 2024-11-12 ENCOUNTER — Other Ambulatory Visit (HOSPITAL_COMMUNITY): Payer: Self-pay

## 2024-11-12 ENCOUNTER — Other Ambulatory Visit: Payer: Self-pay

## 2024-11-12 SURGERY — IMAGING PROCEDURE, GI TRACT, INTRALUMINAL, VIA CAPSULE

## 2024-11-12 NOTE — Telephone Encounter (Signed)
 Pt cancelled capsule study. Please see patient advice request. Thanks!

## 2024-11-13 ENCOUNTER — Other Ambulatory Visit (HOSPITAL_COMMUNITY): Payer: Self-pay

## 2024-11-13 ENCOUNTER — Other Ambulatory Visit (HOSPITAL_BASED_OUTPATIENT_CLINIC_OR_DEPARTMENT_OTHER): Payer: Self-pay

## 2024-11-13 ENCOUNTER — Encounter: Payer: Self-pay | Admitting: Physician Assistant

## 2024-11-13 ENCOUNTER — Other Ambulatory Visit: Payer: Self-pay

## 2024-11-13 NOTE — Telephone Encounter (Signed)
Please respond

## 2024-11-14 ENCOUNTER — Other Ambulatory Visit: Payer: Self-pay

## 2024-11-14 DIAGNOSIS — H2513 Age-related nuclear cataract, bilateral: Secondary | ICD-10-CM | POA: Diagnosis not present

## 2024-11-14 DIAGNOSIS — F411 Generalized anxiety disorder: Secondary | ICD-10-CM | POA: Diagnosis not present

## 2024-11-14 DIAGNOSIS — F3181 Bipolar II disorder: Secondary | ICD-10-CM | POA: Diagnosis not present

## 2024-11-14 NOTE — Addendum Note (Signed)
 Addended by: SHIRLEAN THERISA ORN on: 11/14/2024 03:05 PM   Modules accepted: Orders

## 2024-11-16 ENCOUNTER — Other Ambulatory Visit (HOSPITAL_COMMUNITY): Payer: Self-pay

## 2024-11-18 ENCOUNTER — Other Ambulatory Visit: Payer: Self-pay

## 2024-11-18 NOTE — Addendum Note (Signed)
 Addended by: SHIRLEAN THERISA ORN on: 11/18/2024 04:44 PM   Modules accepted: Orders

## 2024-11-18 NOTE — Telephone Encounter (Signed)
 Please reschedule the capsule.

## 2024-11-19 ENCOUNTER — Ambulatory Visit: Admitting: Nurse Practitioner

## 2024-11-19 MED ORDER — PEG 3350-KCL-NA BICARB-NACL 420 G PO SOLR: 4000.0000 mL | Freq: Once | ORAL | 0 refills | Status: AC

## 2024-11-19 NOTE — Addendum Note (Signed)
 Addended by: JEANELL GRAEME RAMAN on: 11/19/2024 08:40 AM   Modules accepted: Orders

## 2024-11-26 ENCOUNTER — Other Ambulatory Visit: Payer: Self-pay

## 2024-11-26 ENCOUNTER — Other Ambulatory Visit (HOSPITAL_COMMUNITY): Payer: Self-pay

## 2024-11-26 ENCOUNTER — Other Ambulatory Visit: Payer: Self-pay | Admitting: Internal Medicine

## 2024-11-26 MED ORDER — FLUTICASONE PROPIONATE 50 MCG/ACT NA SUSP
1.0000 | Freq: Every day | NASAL | 3 refills | Status: AC
  Filled 2024-11-26 – 2024-12-30 (×2): qty 16, 60d supply, fill #0

## 2024-11-27 ENCOUNTER — Encounter (HOSPITAL_COMMUNITY): Payer: Self-pay

## 2024-11-27 ENCOUNTER — Other Ambulatory Visit (HOSPITAL_COMMUNITY): Payer: Self-pay

## 2024-11-27 ENCOUNTER — Other Ambulatory Visit: Payer: Self-pay

## 2024-11-30 ENCOUNTER — Other Ambulatory Visit (HOSPITAL_COMMUNITY): Payer: Self-pay

## 2024-12-02 ENCOUNTER — Encounter: Payer: Self-pay | Admitting: *Deleted

## 2024-12-02 ENCOUNTER — Other Ambulatory Visit: Payer: Self-pay

## 2024-12-03 ENCOUNTER — Encounter: Payer: Self-pay | Admitting: Physician Assistant

## 2024-12-04 ENCOUNTER — Other Ambulatory Visit: Payer: Self-pay

## 2024-12-05 ENCOUNTER — Encounter: Payer: Self-pay | Admitting: Nurse Practitioner

## 2024-12-06 ENCOUNTER — Other Ambulatory Visit: Payer: Self-pay

## 2024-12-07 ENCOUNTER — Other Ambulatory Visit (HOSPITAL_COMMUNITY): Payer: Self-pay

## 2024-12-09 ENCOUNTER — Ambulatory Visit

## 2024-12-09 ENCOUNTER — Other Ambulatory Visit: Payer: Self-pay

## 2024-12-09 VITALS — BP 124/74 | HR 66 | Temp 97.3°F | Ht 71.0 in | Wt 217.4 lb

## 2024-12-09 DIAGNOSIS — G4733 Obstructive sleep apnea (adult) (pediatric): Secondary | ICD-10-CM

## 2024-12-09 DIAGNOSIS — D839 Common variable immunodeficiency, unspecified: Secondary | ICD-10-CM | POA: Diagnosis not present

## 2024-12-09 DIAGNOSIS — Z87891 Personal history of nicotine dependence: Secondary | ICD-10-CM | POA: Diagnosis not present

## 2024-12-09 DIAGNOSIS — J4540 Moderate persistent asthma, uncomplicated: Secondary | ICD-10-CM | POA: Diagnosis not present

## 2024-12-09 DIAGNOSIS — R0789 Other chest pain: Secondary | ICD-10-CM | POA: Diagnosis not present

## 2024-12-09 MED ORDER — TRELEGY ELLIPTA 100-62.5-25 MCG/ACT IN AEPB
1.0000 | INHALATION_SPRAY | Freq: Every day | RESPIRATORY_TRACT | 5 refills | Status: AC
  Filled 2024-12-09 – 2024-12-12 (×2): qty 60, 30d supply, fill #0
  Filled 2025-01-05: qty 60, 30d supply, fill #1

## 2024-12-09 NOTE — Assessment & Plan Note (Signed)
 Musculoskeletal in nature.  Defer to primary care for further evaluation.  Pulmonary symptom seems less likely.

## 2024-12-09 NOTE — Progress Notes (Signed)
 "  Pulmonology Office Visit   Subjective:  Patient ID: Michelle Clements, female    DOB: [birth year]  MRN: [id number]  Referred by: Bevely Doffing, FNP  CC:  Chief Complaint  Patient presents with   Consult    Patient SaO2 dropping into low 90's.  Previously had dropped as low as 78%.  Patient using smart watch to get readings.  Dr. Cosme referred.  SaO2 much better since patient got new CPAP machine in December 2025.  DME is [employer]    HPI  Michelle Clements is a 59 y.o. female with asthma, OSA on auto CPAP, CVID q2wk shots.  She sees Dr. Prentice George from pulmonary and sleep.  She was having intermittent dips in her oxygen  in 80s while using CPAP.  Her CPAP was replaced.  Since replacement of her CPAP she is not having the dips in O2 anymore.  In addition she has bipolar affective disorder, migraines and has history of SI.  Referred by primary care for evaluation of her shortness of breath.  She prefers establishing both of her care including OSA management and dyspnea management over here.  Respective notes from provider reviewed as appropriate to gather relevant information for patient care.   Discussed the use of AI scribe software for clinical note transcription with the patient, who gave verbal consent to proceed.  History of Present Illness   Michelle Clements is a 59 year old female with moderate obstructive sleep apnea who presents for management of her condition. The referral came through her primary care provider.  She has been diagnosed with moderate obstructive sleep apnea since before 2016 and has been using a CPAP machine since at least 2016. She recently received a new CPAP machine on December 16th, which has improved her oxygen  saturation levels to the high eighties and above, although she still experiences some dips. Her current CPAP settings are between 5 to 18 cm H2O, and she uses nasal pillows with a chin strap. She wakes up once or twice a night and sometimes struggles to fall back  asleep due to chronic body pain.  She experiences shortness of breath, which has been progressively worsening over the past year. She can walk about 100 feet before becoming short of breath. She has a history of smoking half a pack to a pack a day from age 80 until 2011, totaling approximately 30 years. She denies current smoking. She has a history of asthma diagnosed in 1986, triggered by scents, smoke, dust, and mold. She uses an albuterol  inhaler daily and previously used Trelegy but stopped due to oral thrush.  She reports a painful area in her right chest that feels like 'cotton in my lungs,' present for about six months and radiating to her shoulder. She has a history of acromion bursitis diagnosed by an orthopedic specialist, who administered a shoulder injection that provided temporary relief.  She has anxiety and bipolar disorder, with recent increased anxiety. Her endocrinologist is evaluating her for parathyroidism due to high calcium  levels (10.8 mg/dL). She has CVID and receives bi-weekly infusions. She also has reflux, managed with Prilosec. Her family history includes asthma in her father. 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       OSA history: Dx OSA long time ago. Has been on CPAP for at least 2016.   ASTHMA:  First diagnosed: in 40.  FH: father.  Triggers: scents, smokes, dust.  Intubated: no Last steroid  use: last use 1 year ago.  Times albuterol  used: frequent use of albuterol  1 or more/d.  Treatment: trelegy and stopped taking it. Last year.   Others: pregnancy- no, allergy symptoms- yes, throat choking/stridor- no, GERD- yes OSA- yes  Lung Health: Functional status: 100 feet. Now getting worse for last year.  Covid vaccine: dont want.  Influenza vaccine: yes Pneumonococcal vaccine: not sure.  Smoking: 1/2-1 pk/d. 30 yrs.  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 On disability.   PAP download compliance  data: Dec25-jan 2026 AHI 1.5 5-18 cmh2o max 11.3 w median 8.1 Uses 5hr45 min. 25/30 >4hrs.   PRIOR TESTS and IMAGING: PSG/HSAT:  NPSG 2016: Moderate AHI 16.5.  CPAP titration 2021: Only CPAP 5 and 6 tried.  AHI well-controlled on both of the settings.  Minimum O2 88% on CPAP.  CT Chest: oct 2025 > no significant emphysema.   PFT: None on file.       12/09/2024    1:00 PM  Results of the Epworth flowsheet  Sitting and reading 2  Watching TV 3  Sitting, inactive in a public place (e.g. a theatre or a meeting) 1  As a passenger in a car for an hour without a break 3  Lying down to rest in the afternoon when circumstances permit 3  Sitting and talking to someone 1  Sitting quietly after a lunch without alcohol 2  In a car, while stopped for a few minutes in traffic 0  Total score 15    Allergies: Dust mite extract, Gramineae pollens, Misc. sulfonamide containing compounds, Molds & smuts, Other, Statins, Sulfa antibiotics, Sulfamethoxazole-trimethoprim, Trichophyton, Carbamazepine , Gluten meal, Quetiapine, and Quetiapine fumarate Current Medications[1] Past Medical History:  Diagnosis Date   Allergic rhinitis    Anemia    Arthritis    Lower back and hips   Asthma    Bipolar disorder (HCC)    Bronchitis 05/2024   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HCC)    Constipation    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HCC)    Depression    Essential hypertension, benign    Fibromyalgia    GERD (gastroesophageal reflux disease)    H/O sleep apnea    Hip pain, left    History of palpitations    Negative Holter monitor   History of pneumonia 12/05/1986   Hyperlipidemia    IBS (irritable bowel syndrome)    Nausea and vomiting 02/16/2024   Neck pain    Obstructive sleep apnea (adult) (pediatric) 04/12/2021   Poor short term memory    PTSD (post-traumatic stress disorder)    S/P Botox injection 11/04/2014   For migraine headaches   Type 2 diabetes mellitus (HCC)    Urgency of  urination    Past Surgical History:  Procedure Laterality Date   CARPAL TUNNEL RELEASE Right 06/10/2013   Procedure: CARPAL TUNNEL RELEASE;  Surgeon: Darina MALVA Boehringer, MD;  Location: [facility];  Service: Neurosurgery;  Laterality: Right;  Right Carpal Tunnel Release    CHOLECYSTECTOMY     COLONOSCOPY N/A 07/30/2024   Procedure: COLONOSCOPY;  Surgeon: Eartha Angelia Sieving, MD;  Location: [facility];  Service: Gastroenterology;  Laterality: N/A;  930am, asa 3   DENTAL SURGERY  12/05/2014   ESOPHAGOGASTRODUODENOSCOPY (EGD) WITH PROPOFOL  N/A 02/16/2024   Procedure: ESOPHAGOGASTRODUODENOSCOPY (EGD) WITH PROPOFOL ;  Surgeon: Eartha Angelia Sieving, MD;  Location: [facility];  Service: Gastroenterology;  Laterality: N/A;  11:30am;ASA 1   mole exc     x 3   MUSCLE BIOPSY  12/05/2006   Right leg   POLYPECTOMY  02/16/2024   Procedure: POLYPECTOMY;  Surgeon: Eartha Flavors, Toribio, MD;  Location: [facility];  Service: Gastroenterology;;   HARLEY DILATION  02/16/2024   Procedure: EGD, WITH DILATION USING SAVARY-GILLIARD DILATOR OVER GUIDEWIRE;  Surgeon: Eartha Flavors, Toribio, MD;  Location: [facility];  Service: Gastroenterology;;   TRIGGER FINGER RELEASE Right 03/2024   thumb   TUBAL LIGATION     Family History  Problem Relation Age of Onset   Fibromyalgia Mother    Diabetes type II Mother    Depression Mother    Alzheimer's disease Mother    GER disease Mother    Heart disease Mother    Paranoid behavior Mother    Dementia Mother    Heart attack Father 9       MI x5   Stroke Father        CVA x7   Asthma Father    Brain cancer Father    Seizures Father    Anxiety disorder Sister    OCD Sister    Sexual abuse Sister    Physical abuse Sister    Anxiety disorder Sister    Fibromyalgia Sister    Other Sister        Temporomandibular Joint   ADD / ADHD Daughter    ADD / ADHD Daughter    Alcohol abuse Neg Hx    Drug abuse Neg Hx    Schizophrenia Neg  Hx    Social History   Socioeconomic History   Marital status: Married    Spouse name: Not on file   Number of children: 2   Years of education: College   Highest education level: Associate degree: academic program  Occupational History   Occupation: Chiropractor: UNEMPLOYED    Comment: Now disabled due to bipolar and fibromyalgia  Tobacco Use   Smoking status: Former    Current packs/day: 0.00    Types: Cigarettes    Quit date: 11/04/2010    Years since quitting: 14.1    Passive exposure: Past   Smokeless tobacco: Never  Vaping Use   Vaping status: Never Used  Substance and Sexual Activity   Alcohol use: Yes    Comment: one mixed drink per month   Drug use: No   Sexual activity: Yes    Birth control/protection: Surgical  Other Topics Concern   Not on file  Social History Narrative   Married   Lives with spouse and daughter   Right handed.   Caffeine use: 2 cups coffee in morning and 2 cups tea during the day    Social Drivers of Health   Tobacco Use: Medium Risk (12/09/2024)   Patient History    Smoking Tobacco Use: Former    Smokeless Tobacco Use: Never    Passive Exposure: Past  Physicist, Medical Strain: Low Risk (09/24/2024)   Overall Financial Resource Strain (CARDIA)    Difficulty of Paying Living Expenses: Not hard at all  Food Insecurity: No Food Insecurity (09/24/2024)   Epic    Worried About Programme Researcher, Broadcasting/film/video in the Last Year: Never true    Ran Out of Food in the Last Year: Never true  Transportation Needs: No Transportation Needs (09/24/2024)   Epic    Lack of Transportation (Medical): No    Lack of Transportation (Non-Medical): No  Physical Activity: Inactive (09/24/2024)   Exercise Vital Sign    Days of Exercise per Week: 0 days  Minutes of Exercise per Session: 0 min  Stress: Stress Concern Present (09/24/2024)   [employer] of Occupational Health - Occupational Stress Questionnaire    Feeling of Stress: To some extent   Social Connections: Socially Integrated (09/24/2024)   Social Connection and Isolation Panel    Frequency of Communication with Friends and Family: More than three times a week    Frequency of Social Gatherings with Friends and Family: Twice a week    Attends Religious Services: More than 4 times per year    Active Member of [employer] or Organizations: Yes    Attends Banker Meetings: Never    Marital Status: Married  Catering Manager Violence: Not At Risk (09/24/2024)   Epic    Fear of Current or Ex-Partner: No    Emotionally Abused: No    Physically Abused: No    Sexually Abused: No  Depression (PHQ2-9): Medium Risk (10/29/2024)   Depression (PHQ2-9)    PHQ-2 Score: 8  Alcohol Screen: Low Risk (09/24/2024)   Alcohol Screen    Last Alcohol Screening Score (AUDIT): 0  Housing: Low Risk (09/24/2024)   Epic    Unable to Pay for Housing in the Last Year: No    Number of Times Moved in the Last Year: 0    Homeless in the Last Year: No  Utilities: Not At Risk (09/24/2024)   Epic    Threatened with loss of utilities: No  Health Literacy: Adequate Health Literacy (09/24/2024)   B1300 Health Literacy    Frequency of need for help with medical instructions: Never       Objective:  BP 124/74   Pulse 66   Temp (!) 97.3 F (36.3 C) (Temporal)   Ht 5' 11 (1.803 m)   Wt 217 lb 6.4 oz (98.6 kg)   LMP 01/04/2018   SpO2 98% Comment: room air  BMI 30.32 kg/m  BMI Readings from Last 3 Encounters:  12/09/24 30.32 kg/m  10/29/24 30.68 kg/m  10/18/24 30.40 kg/m    Physical Exam: Physical Exam   ENT: Normal mucosa.  PULMONARY: Lungs clear to auscultation bilaterally, no adventitious breath sounds. Reproducible chest pain on right chest wall CARDIOVASCULAR: Regular rate and rhythm, S1 S2 normal, no murmurs. ABDOMEN: Abdomen soft, nontender. Bowel sounds are normal. EXTREMITIES: No peripheral edema noted. MUSCULOSKELETAL: Reproducible chest pain on the right side.        Diagnostic Review:  Last metabolic panel Lab Results  Component Value Date   GLUCOSE 112 (H) 10/18/2024   NA 140 10/18/2024   K 5.9 (H) 10/18/2024   CL 103 10/18/2024   CO2 22 10/18/2024   BUN 17 10/18/2024   CREATININE 1.25 (H) 10/18/2024   EGFR 51 (L) 10/18/2024   CALCIUM  10.8 (H) 10/18/2024   PROT 6.4 10/18/2024   ALBUMIN 4.6 10/18/2024   LABGLOB 1.8 10/18/2024   AGRATIO 1.5 03/29/2016   BILITOT 0.4 10/18/2024   ALKPHOS 87 10/18/2024   AST 27 10/18/2024   ALT 34 (H) 10/18/2024   ANIONGAP 12 08/02/2024         Assessment & Plan:   Assessment & Plan OSA (obstructive sleep apnea)     Moderate persistent asthma, unspecified whether complicated  Orders:   Pulmonary Function Test; Future  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HCC)     Right-sided chest wall pain Musculoskeletal in nature.  Defer to primary care for further evaluation.  Pulmonary symptom seems less likely.       Assessment and Plan  Obstructive sleep apnea Moderate obstructive sleep apnea managed with CPAP since 2016. Recent upgrade to new CPAP machine improved oxygen  saturation. Occasional dips in oxygen  saturation persist. Chronic pain affects sleep duration and quality. - Continue CPAP therapy with current settings. - Ensure regular cleaning of CPAP equipment, including tubing and water chamber. - TOC to LBPU.   Moderate persistent asthma Asthma with triggers including smoke, dust, and mold. Recent increase in shortness of breath and frequent albuterol  use. Previous Trelegy use discontinued due to oral thrush. Possible underlying COPD. Reproducible right-sided chest pain possibly musculoskeletal. - Ordered lung function test to assess for COPD. - Restart Trelegy with new prescription and instruct to gargle mouth after use to prevent thrush. - Continue albuterol  inhaler as needed. - Discuss chest pain with primary care for further evaluation.  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 No acute issues related to CVID. - Continue biweekly infusions for CVID management.         Notes from Dr Donzetta from Nov 2025 reviewed as to gather relevant information for patient care and formulating plan.  She was counselled about not driving while drowsy which is common side effect of sleep related disorders.   Return for sch after PFTs.   I personally spent a total of 40 minutes in the care of the patient today including preparing to see the patient, getting/reviewing separately obtained history, performing a medically appropriate exam/evaluation, counseling and educating, placing orders, documenting clinical information in the EHR, independently interpreting results, and communicating results.   Anjulie Dipierro, MD     [1]  Current Outpatient Medications:    albuterol  (VENTOLIN  HFA) 108 (90 Base) MCG/ACT inhaler, Inhale 2 puffs into the lungs every 6 (six) hours as needed for wheezing and/or shortness of breath., Disp: 6.7 g, Rfl: 5   aspirin EC 81 MG tablet, Take 1 tablet every day by oral route., Disp: , Rfl:    azelastine  (ASTELIN ) 0.1 % nasal spray, Place 1 spray into both nostrils 2 (two) times daily., Disp: 30 mL, Rfl: 2   Blood Glucose Monitoring Suppl (ONETOUCH VERIO) w/Device KIT, Use to check glucose once daily, Disp: 1 kit, Rfl: 0   buPROPion  (WELLBUTRIN  XL) 150 MG 24 hr tablet, Take 1 tablet (150 mg total) by mouth every morning., Disp: 30 tablet, Rfl: 2   cetirizine  (ZYRTEC ) 10 MG tablet, Take 10 mg by mouth at bedtime., Disp: , Rfl:    clonazePAM  (KLONOPIN ) 0.5 MG tablet, Take 1 tablet (0.5 mg total) by mouth at bedtime., Disp: 30 tablet, Rfl: 2   Coenzyme Q10 (COQ10 PO), Take by mouth. One daily, Disp: , Rfl:    Continuous Glucose Sensor (FREESTYLE LIBRE 3 PLUS SENSOR) MISC, Change sensor every 15 days., Disp: 6 each, Rfl: 3   dapagliflozin  propanediol (FARXIGA ) 10 MG TABS tablet, Take 1 tablet (10 mg total) by mouth  in the morning., Disp: 30 tablet, Rfl: 11   estradiol  (ESTRACE ) 0.1 MG/GM vaginal cream, Place 1 Applicatorful vaginally 3 (three) times a week., Disp: 42.5 g, Rfl: 1   FLUoxetine  (PROZAC ) 20 MG capsule, Take 1 capsule (20 mg total) by mouth daily., Disp: 30 capsule, Rfl: 2   FLUoxetine  (PROZAC ) 40 MG capsule, Take 1 capsule (40 mg total) by mouth daily., Disp: 30 capsule, Rfl: 2   fluticasone  (FLONASE ) 50 MCG/ACT nasal spray, Place 1 spray into both nostrils daily., Disp: 16 g, Rfl: 3   Galcanezumab -gnlm (EMGALITY ) 120 MG/ML SOSY, Inject 1 mL into the skin every 30 (thirty) days.,  Disp: 1 mL, Rfl: 11   gatifloxacin  (ZYMAXID ) 0.5 % SOLN, Instill 1 drop into right eye four times a day as directed Store upside down!, Disp: 5 mL, Rfl: 1   gatifloxacin  (ZYMAXID ) 0.5 % SOLN, Place 1 drop into the left eye 4 (four) times daily as directed. Store upside down!, Disp: 5 mL, Rfl: 1   Immune Globulin , Human,-klhw (XEMBIFY ) 10 GM/50ML SOLN, Inject 10 g into the skin every 7 (seven) days., Disp: 200 mL, Rfl: 3   insulin  glargine (LANTUS ) 100 UNIT/ML Solostar Pen, Inject 40 Units into the skin at bedtime. (Patient taking differently: Inject 30 Units into the skin at bedtime.), Disp: 36 mL, Rfl: 3   ipratropium-albuterol  (DUONEB) 0.5-2.5 (3) MG/3ML SOLN, Take 3 mLs by nebulization 2 (two) times daily as needed., Disp: 90 mL, Rfl: 2   ketorolac  (ACULAR ) 0.5 % ophthalmic solution, Place 1 drop into the right eye 2 (two) times daily as directed, Disp: 5 mL, Rfl: 1   ketorolac  (ACULAR ) 0.5 % ophthalmic solution, Place 1 drop into the left eye 2 (two) times daily as directed, Disp: 5 mL, Rfl: 1   lamoTRIgine  (LAMICTAL ) 25 MG tablet, Take 1 tablet (25 mg total) by mouth 2 (two) times daily., Disp: 60 tablet, Rfl: 2   Lancets (ONETOUCH ULTRASOFT) lancets, Use to check blood sugar once daily, Disp: 100 each, Rfl: 3   Latanoprostene Bunod  (VYZULTA ) 0.024 % SOLN, Place 1 drop into both eyes at bedtime as directed., Disp: 15  mL, Rfl: 3   omeprazole  (PRILOSEC) 20 MG capsule, Take 1 capsule (20 mg total) by mouth 2 (two) times daily., Disp: 60 capsule, Rfl: 2   ondansetron  (ZOFRAN -ODT) 8 MG disintegrating tablet, Dissolve 1 tablet (8 mg total) by mouth every 8 (eight) hours as needed for nausea or vomiting., Disp: 20 tablet, Rfl: 6   ONETOUCH VERIO test strip, Use to check glucose once daily., Disp: 100 each, Rfl: 3   OVER THE COUNTER MEDICATION, Magnessium 500 mg at night, Disp: , Rfl:    prednisoLONE  acetate (PRED FORTE ) 1 % ophthalmic suspension, Place 1 drop into the right eye 4 (four) times daily as directed. Please shake well before each use!, Disp: 5 mL, Rfl: 1   prednisoLONE  acetate (PRED FORTE ) 1 % ophthalmic suspension, Place 1 drop into the left eye 4 (four) times daily as directed. Please shake well before each use!, Disp: 5 mL, Rfl: 1   pregabalin  (LYRICA ) 200 MG capsule, Take 1 capsule (200 mg total) by mouth 3 (three) times daily., Disp: 90 capsule, Rfl: 5   tiZANidine  (ZANAFLEX ) 4 MG tablet, Take 1 tablet (4 mg total) by mouth every 12 (twelve) hours as needed., Disp: 60 tablet, Rfl: 11   traMADol  (ULTRAM ) 50 MG tablet, Take 1 tablet (50 mg total) by mouth every 6 (six) hours as needed., Disp: 15 tablet, Rfl: 0   traZODone  (DESYREL ) 50 MG tablet, Take 1 tablet (50 mg total) by mouth at bedtime., Disp: 30 tablet, Rfl: 2   UNABLE TO FIND, 1 each by Does not apply route daily. Med Name: CPAP SUPPLIES Full face mask Cushion for mask -Medium ResMed, Disp: 1 each, Rfl: 11   VITAMIN D  PO, Take 5,000 Units by mouth daily., Disp: , Rfl:    zinc gluconate 50 MG tablet, Take 50 mg by mouth daily., Disp: , Rfl:    zolmitriptan  (ZOMIG ) 5 MG nasal solution, Administer 5 mg into affected nostril(s) as needed (1 nasal spray at onset of severe headache; may repeat  once after 2 hours if headache persists; max of 2 doses in 24 hours or 4/week.)., Disp: 12 each, Rfl: 5   zonisamide  (ZONEGRAN ) 50 MG capsule, Take 1 capsule by  mouth at bedtime for 5 days then increase to 2 capsules at bedtime thereafter., Disp: 60 capsule, Rfl: 3   Fluticasone -Umeclidin-Vilant (TRELEGY ELLIPTA ) 100-62.5-25 MCG/ACT AEPB, Inhale 1 puff into the lungs daily., Disp: 1 each, Rfl: 5  "

## 2024-12-09 NOTE — Assessment & Plan Note (Addendum)
 Orders:    Pulmonary Function Test; Future

## 2024-12-09 NOTE — Patient Instructions (Signed)
" °  VISIT SUMMARY: Today, you were seen for the management of your moderate obstructive sleep apnea and other related health concerns. We discussed your current treatment plan and made some adjustments to help improve your symptoms and overall health.  YOUR PLAN: -OBSTRUCTIVE SLEEP APNEA: Obstructive sleep apnea is a condition where your airway becomes blocked during sleep, causing breathing pauses. You will continue using your CPAP machine with the current settings. Make sure to clean your CPAP equipment regularly, including the tubing and water chamber. Please discuss with your DME company about cleaning the heated tubing.  -MODERATE PERSISTENT ASTHMA: Asthma is a condition where your airways become inflamed and narrow, making it hard to breathe. You will restart using Trelegy with a new prescription and remember to gargle your mouth after use to prevent thrush. Continue using your albuterol  inhaler as needed. We have also ordered a lung function test to check for COPD. Please discuss your chest pain with your primary care provider for further evaluation.  -COMMON VARIABLE IMMUNODEFICIENCY (CVID): CVID is a disorder that impairs your immune system, making you more susceptible to infections. You will continue with your biweekly infusions to manage this condition.  INSTRUCTIONS: Please follow up with your primary care provider to discuss your chest pain further. Additionally, ensure you complete the lung function test as ordered.                      Contains text generated by [employer]bridge.                                 Contains text generated by [employer]bridge.   "

## 2024-12-10 ENCOUNTER — Other Ambulatory Visit (HOSPITAL_COMMUNITY): Payer: Self-pay

## 2024-12-10 LAB — TSH: TSH: 2.03 u[IU]/mL (ref 0.450–4.500)

## 2024-12-10 LAB — T4, FREE: Free T4: 0.8 ng/dL — ABNORMAL LOW (ref 0.82–1.77)

## 2024-12-10 LAB — T3, FREE: T3, Free: 1.6 pg/mL — ABNORMAL LOW (ref 2.0–4.4)

## 2024-12-10 LAB — MAGNESIUM: Magnesium: 2.5 mg/dL — ABNORMAL HIGH (ref 1.6–2.3)

## 2024-12-10 LAB — PTH, INTACT AND CALCIUM
Calcium: 10.5 mg/dL — ABNORMAL HIGH (ref 8.7–10.2)
PTH: 31 pg/mL (ref 15–65)

## 2024-12-10 LAB — VITAMIN D 25 HYDROXY (VIT D DEFICIENCY, FRACTURES): Vit D, 25-Hydroxy: 56.1 ng/mL (ref 30.0–100.0)

## 2024-12-10 LAB — CORTISOL: Cortisol: 2.5 ug/dL — ABNORMAL LOW (ref 6.2–19.4)

## 2024-12-10 LAB — PHOSPHORUS: Phosphorus: 3.8 mg/dL (ref 3.0–4.3)

## 2024-12-11 ENCOUNTER — Ambulatory Visit: Payer: Self-pay | Admitting: Nurse Practitioner

## 2024-12-11 ENCOUNTER — Encounter: Payer: Self-pay | Admitting: Pharmacist

## 2024-12-11 ENCOUNTER — Other Ambulatory Visit: Payer: Self-pay

## 2024-12-11 ENCOUNTER — Other Ambulatory Visit (HOSPITAL_COMMUNITY): Payer: Self-pay

## 2024-12-11 DIAGNOSIS — R7689 Other specified abnormal immunological findings in serum: Secondary | ICD-10-CM

## 2024-12-12 ENCOUNTER — Other Ambulatory Visit: Payer: Self-pay

## 2024-12-12 MED ORDER — LEVOTHYROXINE SODIUM 25 MCG PO TABS
25.0000 ug | ORAL_TABLET | Freq: Every day | ORAL | 0 refills | Status: AC
  Filled 2024-12-12 (×2): qty 90, 90d supply, fill #0

## 2024-12-12 NOTE — Telephone Encounter (Signed)
 Please schedule follow up in office in 8 weeks with previsit labs.

## 2024-12-16 ENCOUNTER — Other Ambulatory Visit: Payer: Self-pay

## 2024-12-17 ENCOUNTER — Other Ambulatory Visit (HOSPITAL_COMMUNITY): Payer: Self-pay

## 2024-12-17 MED ORDER — EMGALITY 120 MG/ML ~~LOC~~ SOAJ
120.0000 mg | SUBCUTANEOUS | 11 refills | Status: AC
  Filled 2024-12-17: qty 1, 30d supply, fill #0
  Filled 2025-01-10: qty 1, 30d supply, fill #1

## 2024-12-20 ENCOUNTER — Other Ambulatory Visit: Payer: Self-pay

## 2024-12-20 ENCOUNTER — Ambulatory Visit: Payer: Self-pay | Admitting: Pharmacist

## 2024-12-20 ENCOUNTER — Other Ambulatory Visit (HOSPITAL_COMMUNITY): Payer: Self-pay

## 2024-12-23 ENCOUNTER — Encounter (HOSPITAL_COMMUNITY): Admission: RE | Payer: Self-pay | Source: Home / Self Care

## 2024-12-23 ENCOUNTER — Telehealth: Payer: Self-pay | Admitting: *Deleted

## 2024-12-23 ENCOUNTER — Encounter: Payer: Self-pay | Admitting: *Deleted

## 2024-12-23 ENCOUNTER — Ambulatory Visit (HOSPITAL_COMMUNITY): Admission: RE | Admit: 2024-12-23 | Admitting: Gastroenterology

## 2024-12-23 SURGERY — IMAGING PROCEDURE, GI TRACT, INTRALUMINAL, VIA CAPSULE

## 2024-12-23 NOTE — Telephone Encounter (Signed)
 Per Michelle Clements in endo called in to cancel her Michelle Clements this am she said she thinks she has the flu

## 2024-12-24 ENCOUNTER — Other Ambulatory Visit (HOSPITAL_COMMUNITY): Payer: Self-pay

## 2024-12-24 ENCOUNTER — Other Ambulatory Visit: Payer: Self-pay

## 2024-12-24 NOTE — Progress Notes (Signed)
 When would you like to see her?  I have appt with her for follow up in March.  Do you want to see her sooner?

## 2024-12-24 NOTE — Progress Notes (Signed)
 My patient, I see her for diabetes and thyroid  (positive thyroid  antibodies with euthyroid labs).  She had a fasting cortisol level drawn for concerns with recent weight loss and it was low.  Can you advise on what we need to do next?  Perhaps she may need follow up with you next?

## 2024-12-25 ENCOUNTER — Other Ambulatory Visit: Payer: Self-pay

## 2024-12-25 ENCOUNTER — Other Ambulatory Visit (HOSPITAL_COMMUNITY): Payer: Self-pay

## 2024-12-26 LAB — PANCREATIC ELASTASE, FECAL: Pancreatic Elastase, Fecal: 41 ug Elast./g — ABNORMAL LOW

## 2024-12-27 ENCOUNTER — Encounter: Payer: Self-pay | Admitting: "Endocrinology

## 2024-12-27 ENCOUNTER — Other Ambulatory Visit: Payer: Self-pay | Admitting: "Endocrinology

## 2024-12-27 ENCOUNTER — Encounter: Payer: Self-pay | Admitting: Nurse Practitioner

## 2024-12-27 DIAGNOSIS — E236 Other disorders of pituitary gland: Secondary | ICD-10-CM

## 2024-12-30 ENCOUNTER — Other Ambulatory Visit (HOSPITAL_COMMUNITY): Payer: Self-pay

## 2024-12-30 ENCOUNTER — Encounter: Payer: Self-pay | Admitting: Pharmacy Technician

## 2024-12-30 ENCOUNTER — Other Ambulatory Visit: Payer: Self-pay

## 2024-12-30 ENCOUNTER — Encounter: Payer: Self-pay | Admitting: "Endocrinology

## 2024-12-31 ENCOUNTER — Ambulatory Visit

## 2024-12-31 ENCOUNTER — Other Ambulatory Visit (HOSPITAL_COMMUNITY): Payer: Self-pay | Admitting: "Endocrinology

## 2024-12-31 DIAGNOSIS — E2740 Unspecified adrenocortical insufficiency: Secondary | ICD-10-CM | POA: Insufficient documentation

## 2024-12-31 DIAGNOSIS — E236 Other disorders of pituitary gland: Secondary | ICD-10-CM | POA: Insufficient documentation

## 2025-01-01 ENCOUNTER — Telehealth: Payer: Self-pay

## 2025-01-01 NOTE — Telephone Encounter (Signed)
 Auth Submission: NO AUTH NEEDED Site of care: Site of care: [facility] Payer: [employer] ppo Medication & CPT/J Code(s) submitted: cosyntropin [id number] Diagnosis Code:  Route of submission (phone, fax, portal): phone Phone # Fax # Auth type: Buy/Bill PB Units/visits requested: 1 dose Reference number:  Approval from: 01/01/25 to 12/04/25

## 2025-01-05 ENCOUNTER — Other Ambulatory Visit: Payer: Self-pay

## 2025-01-07 ENCOUNTER — Ambulatory Visit: Payer: Self-pay

## 2025-01-07 ENCOUNTER — Other Ambulatory Visit: Payer: Self-pay | Admitting: Emergency Medicine

## 2025-01-07 ENCOUNTER — Encounter: Admitting: Emergency Medicine

## 2025-01-07 VITALS — BP 136/73 | HR 67 | Temp 97.5°F | Resp 18

## 2025-01-07 DIAGNOSIS — E236 Other disorders of pituitary gland: Secondary | ICD-10-CM

## 2025-01-07 DIAGNOSIS — E2740 Unspecified adrenocortical insufficiency: Secondary | ICD-10-CM

## 2025-01-07 MED ORDER — COSYNTROPIN 0.25 MG IJ SOLR
0.2500 mg | Freq: Once | INTRAMUSCULAR | Status: AC
  Administered 2025-01-07: 0.25 mg via INTRAVENOUS

## 2025-01-07 NOTE — Progress Notes (Signed)
 Diagnosis:  Adrenal insufficiency    Provider:  Nida, Gebreselassie Woldeberhan MD  Procedure: Injection  Cortrosyn  (cosyntropin ), Dose: 0.25 mg, Site: intramuscular, Number of injections: 1 Initial blood work drawn, 30/60 min post injection blood work drawn Injection Site(s): Left deltoid  Post Care: Peripheral IV Discontinued  Discharge: Condition: Good, Destination: Home . AVS Declined  Performed by:  Delon ONEIDA Officer, RN

## 2025-01-08 ENCOUNTER — Encounter (HOSPITAL_COMMUNITY)
Admission: RE | Disposition: A | Discharge status: Home / Self Care | Payer: Self-pay | Source: Home / Self Care | Attending: Gastroenterology

## 2025-01-08 ENCOUNTER — Other Ambulatory Visit: Payer: Self-pay

## 2025-01-08 ENCOUNTER — Ambulatory Visit (HOSPITAL_COMMUNITY)
Admission: RE | Admit: 2025-01-08 | Discharge: 2025-01-08 | Disposition: A | Discharge status: Home / Self Care | Source: Home / Self Care | Attending: Gastroenterology | Admitting: Gastroenterology

## 2025-01-08 NOTE — H&P (Signed)
 Michelle Clements is a 58 y.o. female  with a history of CVID, depression, bipolar disorder, asthma, depression, GERD, IBS-C, PTSD, diabetes, interstitial cystitis, CKD,, fibromyalgia, abdominal pain, dysphagia, dyssynergic defecation here for capsule endoscopy for evaluation of iron deficiency anemia   Patient was not seen and was a direct schedule for capsule endoscopy . This was taken care by nursing staff. Will follow up on results once recorder is returned

## 2025-01-09 LAB — ACTH STIMULATION, 3 TIME POINTS
Cortisol #1 (Base): 8.4 ug/dL (ref 6.2–19.4)
Cortisol #2: 27.6 ug/dL
Cortisol #3: 24.1 ug/dL

## 2025-01-10 ENCOUNTER — Ambulatory Visit

## 2025-01-10 ENCOUNTER — Encounter

## 2025-01-10 ENCOUNTER — Other Ambulatory Visit: Payer: Self-pay

## 2025-01-20 ENCOUNTER — Ambulatory Visit: Payer: Self-pay

## 2025-01-20 ENCOUNTER — Ambulatory Visit: Admitting: Allergy & Immunology

## 2025-01-23 ENCOUNTER — Ambulatory Visit

## 2025-02-03 ENCOUNTER — Inpatient Hospital Stay: Admitting: Physician Assistant

## 2025-02-04 ENCOUNTER — Telehealth (HOSPITAL_COMMUNITY): Admitting: Psychiatry

## 2025-02-14 ENCOUNTER — Ambulatory Visit: Admitting: "Endocrinology

## 2025-02-18 ENCOUNTER — Inpatient Hospital Stay: Attending: Physician Assistant | Admitting: Physician Assistant

## 2025-09-29 ENCOUNTER — Ambulatory Visit
